# Patient Record
Sex: Female | Born: 1945 | Race: White | Hispanic: No | Smoking: Former smoker
Health system: Southern US, Community
[De-identification: ages and names within clinical notes are randomized; demographics above are authoritative.]

## PROBLEM LIST (undated history)

## (undated) DIAGNOSIS — R911 Solitary pulmonary nodule: Secondary | ICD-10-CM

## (undated) DIAGNOSIS — Z87891 Personal history of nicotine dependence: Secondary | ICD-10-CM

## (undated) DIAGNOSIS — I69354 Hemiplegia and hemiparesis following cerebral infarction affecting left non-dominant side: Secondary | ICD-10-CM

## (undated) DIAGNOSIS — L570 Actinic keratosis: Secondary | ICD-10-CM

## (undated) DIAGNOSIS — N952 Postmenopausal atrophic vaginitis: Secondary | ICD-10-CM

## (undated) DIAGNOSIS — N893 Dysplasia of vagina, unspecified: Secondary | ICD-10-CM

## (undated) DIAGNOSIS — R296 Repeated falls: Secondary | ICD-10-CM

## (undated) DIAGNOSIS — B1920 Unspecified viral hepatitis C without hepatic coma: Secondary | ICD-10-CM

## (undated) DIAGNOSIS — R531 Weakness: Secondary | ICD-10-CM

## (undated) DIAGNOSIS — R102 Pelvic and perineal pain unspecified side: Secondary | ICD-10-CM

## (undated) DIAGNOSIS — I10 Essential (primary) hypertension: Secondary | ICD-10-CM

## (undated) DIAGNOSIS — M1990 Unspecified osteoarthritis, unspecified site: Secondary | ICD-10-CM

## (undated) DIAGNOSIS — T17500A Unspecified foreign body in bronchus causing asphyxiation, initial encounter: Secondary | ICD-10-CM

## (undated) DIAGNOSIS — M810 Age-related osteoporosis without current pathological fracture: Secondary | ICD-10-CM

## (undated) DIAGNOSIS — J984 Other disorders of lung: Secondary | ICD-10-CM

## (undated) DIAGNOSIS — K219 Gastro-esophageal reflux disease without esophagitis: Secondary | ICD-10-CM

## (undated) DIAGNOSIS — E7800 Pure hypercholesterolemia, unspecified: Secondary | ICD-10-CM

## (undated) DIAGNOSIS — Z7901 Long term (current) use of anticoagulants: Secondary | ICD-10-CM

## (undated) DIAGNOSIS — I493 Ventricular premature depolarization: Secondary | ICD-10-CM

## (undated) DIAGNOSIS — E041 Nontoxic single thyroid nodule: Secondary | ICD-10-CM

## (undated) DIAGNOSIS — I639 Cerebral infarction, unspecified: Secondary | ICD-10-CM

## (undated) DIAGNOSIS — R2681 Unsteadiness on feet: Secondary | ICD-10-CM

## (undated) DIAGNOSIS — B009 Herpesviral infection, unspecified: Secondary | ICD-10-CM

## (undated) DIAGNOSIS — I491 Atrial premature depolarization: Secondary | ICD-10-CM

## (undated) DIAGNOSIS — M069 Rheumatoid arthritis, unspecified: Secondary | ICD-10-CM

## (undated) DIAGNOSIS — R87629 Unspecified abnormal cytological findings in specimens from vagina: Secondary | ICD-10-CM

## (undated) DIAGNOSIS — N951 Menopausal and female climacteric states: Secondary | ICD-10-CM

## (undated) DIAGNOSIS — E039 Hypothyroidism, unspecified: Secondary | ICD-10-CM

## (undated) DIAGNOSIS — R918 Other nonspecific abnormal finding of lung field: Secondary | ICD-10-CM

## (undated) DIAGNOSIS — R053 Chronic cough: Secondary | ICD-10-CM

## (undated) HISTORY — DX: Unspecified abnormal cytological findings in specimens from vagina: R87.629

## (undated) HISTORY — DX: Herpesviral infection, unspecified: B00.9

## (undated) HISTORY — PX: OTHER SURGICAL HISTORY: SHX169

## (undated) HISTORY — DX: Gastro-esophageal reflux disease without esophagitis: K21.9

## (undated) HISTORY — PX: CHOLECYSTECTOMY: SHX55

## (undated) HISTORY — DX: Pelvic and perineal pain: R10.2

## (undated) HISTORY — DX: Menopausal and female climacteric states: N95.1

## (undated) HISTORY — DX: Age-related osteoporosis without current pathological fracture: M81.0

## (undated) HISTORY — DX: Pelvic and perineal pain unspecified side: R10.20

## (undated) HISTORY — PX: LOOP RECORDER REMOVAL: EP1215

## (undated) HISTORY — DX: Solitary pulmonary nodule: R91.1

## (undated) HISTORY — PX: TONSILLECTOMY: SUR1361

## (undated) HISTORY — PX: VAGINAL WOUND CLOSURE / REPAIR: SUR258

## (undated) HISTORY — DX: Essential (primary) hypertension: I10

## (undated) HISTORY — DX: Cerebral infarction, unspecified: I63.9

## (undated) HISTORY — DX: Unspecified osteoarthritis, unspecified site: M19.90

## (undated) HISTORY — DX: Hypothyroidism, unspecified: E03.9

## (undated) HISTORY — PX: ADENOIDECTOMY: SUR15

## (undated) HISTORY — DX: Pure hypercholesterolemia, unspecified: E78.00

## (undated) HISTORY — DX: Actinic keratosis: L57.0

## (undated) HISTORY — DX: Dysplasia of vagina, unspecified: N89.3

## (undated) HISTORY — DX: Unspecified viral hepatitis C without hepatic coma: B19.20

## (undated) HISTORY — DX: Postmenopausal atrophic vaginitis: N95.2

## (undated) HISTORY — DX: Nontoxic single thyroid nodule: E04.1

---
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
## 1975-12-29 HISTORY — PX: TUBAL LIGATION: SHX77

## 1980-12-28 HISTORY — PX: FINGER SURGERY: SHX640

## 1981-12-28 HISTORY — PX: VAGINAL HYSTERECTOMY: SUR661

## 1990-12-28 HISTORY — PX: MANDIBLE RECONSTRUCTION: SHX431

## 1992-12-28 HISTORY — PX: BUNIONECTOMY: SHX129

## 1996-12-28 HISTORY — PX: TOENAIL EXCISION: SUR558

## 2000-06-25 ENCOUNTER — Ambulatory Visit (HOSPITAL_COMMUNITY): Admission: RE | Admit: 2000-06-25 | Discharge: 2000-06-25 | Payer: Self-pay

## 2004-10-15 ENCOUNTER — Ambulatory Visit: Payer: Self-pay | Admitting: Internal Medicine

## 2004-10-28 ENCOUNTER — Ambulatory Visit: Payer: Self-pay | Admitting: Internal Medicine

## 2005-02-03 ENCOUNTER — Ambulatory Visit: Payer: Self-pay | Admitting: Internal Medicine

## 2005-07-28 ENCOUNTER — Ambulatory Visit: Payer: Self-pay

## 2005-10-20 ENCOUNTER — Ambulatory Visit: Payer: Self-pay | Admitting: Internal Medicine

## 2006-03-23 ENCOUNTER — Ambulatory Visit: Payer: Self-pay | Admitting: Internal Medicine

## 2006-05-26 ENCOUNTER — Ambulatory Visit: Payer: Self-pay | Admitting: Internal Medicine

## 2006-09-20 ENCOUNTER — Ambulatory Visit: Payer: Self-pay | Admitting: Internal Medicine

## 2006-10-22 ENCOUNTER — Ambulatory Visit: Payer: Self-pay | Admitting: Internal Medicine

## 2007-05-18 ENCOUNTER — Ambulatory Visit: Payer: Self-pay | Admitting: Internal Medicine

## 2007-09-23 ENCOUNTER — Ambulatory Visit: Payer: Self-pay | Admitting: Internal Medicine

## 2007-10-13 ENCOUNTER — Ambulatory Visit: Payer: Self-pay | Admitting: Internal Medicine

## 2007-10-25 ENCOUNTER — Ambulatory Visit: Payer: Self-pay | Admitting: Internal Medicine

## 2007-12-30 DIAGNOSIS — F320 Major depressive disorder, single episode, mild: Secondary | ICD-10-CM

## 2007-12-30 DIAGNOSIS — I10 Essential (primary) hypertension: Secondary | ICD-10-CM

## 2008-01-02 ENCOUNTER — Ambulatory Visit: Payer: Self-pay | Admitting: Internal Medicine

## 2008-01-02 DIAGNOSIS — J984 Other disorders of lung: Secondary | ICD-10-CM | POA: Insufficient documentation

## 2008-11-27 ENCOUNTER — Ambulatory Visit: Payer: Self-pay | Admitting: Internal Medicine

## 2008-11-30 ENCOUNTER — Telehealth (INDEPENDENT_AMBULATORY_CARE_PROVIDER_SITE_OTHER): Payer: Self-pay | Admitting: *Deleted

## 2008-12-13 ENCOUNTER — Ambulatory Visit: Payer: Self-pay | Admitting: Internal Medicine

## 2009-04-10 ENCOUNTER — Ambulatory Visit: Payer: Self-pay | Admitting: Internal Medicine

## 2009-12-10 ENCOUNTER — Ambulatory Visit: Payer: Self-pay | Admitting: Internal Medicine

## 2010-09-23 ENCOUNTER — Ambulatory Visit: Payer: Self-pay

## 2010-12-11 ENCOUNTER — Ambulatory Visit: Payer: Self-pay | Admitting: Internal Medicine

## 2011-05-12 NOTE — Assessment & Plan Note (Signed)
[facility]                             PULMONARY OFFICE NOTE   NAME:Donna Rhodes, Donna Rhodes                       MRN:          [id number]  DATE:10/13/2007                            DOB:          June 04, 1946    PULMONARY CONSULTATION   REASON FOR CONSULTATION:  Abnormal CT scan.   HISTORY:  A 65 year old white female who says that she originally  underwent a screening chest x-ray that showed a spot on her lung.  This was originally in September of 2007, leading to serial CT scans,  the most recent of which was available from September 26 of this year  and shows a stable 4 mm nodule in the right lower lobe.  There are also  several new patchy densities in the lower lobes in a dependent fashion  with a question of whether this might be atelectasis or a new process.   The patient's only complaint is one of intermittent cough that she says,  in retrospect, may be worse in the last few years, but certainly is  not acute in nature and is not associated with any pleuritic pain,  fevers, chills, sweats, increased dyspnea, history of hemoptysis, or  unintended weight loss.   PAST MEDICAL HISTORY:  Significant for hypertension, thyroid problems,  hysterectomy, appendectomy, and anxiety/depression.   ALLERGIES:  CODEINE with a nonspecific reaction, and NONSTEROIDALS.   MEDICATIONS:  Citalopram.  Lisinopril.  Estradiol.  Synthroid.  Prenatal.   SOCIAL HISTORY:  She quit smoking in 1982.  Has worked as a Chartered certified accountant.   FAMILY HISTORY:  Significant in that it is negative for any cancer in  any direct relatives.   REVIEW OF SYSTEMS:  Taken in detail on the work sheet.  She says she can  mow grass for an hour behind a push mower without difficulty.   PHYSICAL EXAMINATION:  This is a very anxious white female in no acute  distress.  VITAL SIGNS:  Stable vital signs.  HEENT:  Minimal turbinate edema.  Oropharynx is clear.  NECK:  Supple without cervical adenopathy  or tenderness.  Trachea is  midline.  No thyromegaly.  LUNGS:  Perfectly clear bilaterally to auscultation and percussion.  CARDIAC:  Regular rate and rhythm without murmur, gallop, or rub.  ABDOMEN:  Soft and benign.  EXTREMITIES:  Warm without calf tenderness, cyanosis, clubbing, or  edema.   A CT scan was reviewed from September 26, as was lab data, including a  normal TSH, chemistry profile.   IMPRESSION:  1. The 4 mm nodule in the right lower lobe is stable for over a year      and is extremely small.  I suspect that is not what was actually      seen on the original chest x-ray that called for the CT scan in the      first place.  I emphasized this to the patient because she has got      the notion that she has a spot on her lung that was seen on the      original x-ray.  I would be  happy to take a look at the original x-      ray to see what it was of concern, but 4 mm is usually not      detectable by the naked eye.  This, of course, brings up the issue      of how long the nodule has been present, since if it is not seen on      chest x-ray, which I believe is probably the case, we cannot      actually tell how old it is and it may have been present for years.      Rather than continuing to do CT scans at this point, there are      several options here.  First, the PET scan is not of any value, nor      is attempting any kind of biopsy or surgery.  In fact, I am not      sure that serial CT scanning is necessary either given the relative      cost and potential radiation involved.  When I see her back, I will      take a look at her plain films and see if there is anything to be      gained on serial chest x-rays as a way of following this.  However,      my sense is that it is extremely unlikely that this is a neoplasm.  2. Intermittent cough may be aggravated by the use of ACE inhibitors.      She says that her cough has gotten worse over the years despite      stopping  smoking.  This is certainly a clue that the ACE inhibitors      might be playing a role.  Note, however, that the cough waxes and      wanes depending on whether she has got a cold or not, and at this      point is more of a nuisance than a threat to her health, so I am      going to refer her to Dr. Roby Lofts capable hands, considering an      alternative agent, such as ARB therapy, and/or depending on the      patient's ability to pay for generic, the possibility of using low      dose beta blockers instead.   The patient point blank asked me, Does that mean I don't have  emphysema?  I cannot really be categoric about her lung functional  status without having her return for a set of PFTs, so I have scheduled  that for 6 weeks and asked her to bring all of her old x-rays back for  serial comparison purposes.     Donna Rhodes. Sherene Sires, MD, [facility]  Electronically Signed    MBW/MedQ  DD: 10/13/2007  DT: 10/14/2007  Job #: [id number]   cc:   Dale

## 2011-06-02 ENCOUNTER — Ambulatory Visit: Payer: Self-pay | Admitting: Internal Medicine

## 2011-08-22 ENCOUNTER — Inpatient Hospital Stay: Payer: Self-pay | Admitting: Internal Medicine

## 2011-08-26 ENCOUNTER — Ambulatory Visit: Payer: Self-pay | Admitting: Surgery

## 2012-01-21 ENCOUNTER — Ambulatory Visit: Payer: Self-pay | Admitting: Internal Medicine

## 2012-09-14 DIAGNOSIS — D1030 Benign neoplasm of unspecified part of mouth: Secondary | ICD-10-CM | POA: Insufficient documentation

## 2012-10-12 ENCOUNTER — Other Ambulatory Visit: Payer: Self-pay | Admitting: *Deleted

## 2012-10-12 MED ORDER — ESOMEPRAZOLE MAGNESIUM 40 MG PO CPDR: 40.0000 mg | DELAYED_RELEASE_CAPSULE | Freq: Every day | ORAL | Status: DC

## 2012-10-12 NOTE — Telephone Encounter (Signed)
R'cd fax from [facility] for refill of Nexium. Rx faxed to [facility].

## 2012-12-22 ENCOUNTER — Telehealth: Payer: Self-pay | Admitting: Internal Medicine

## 2012-12-22 MED ORDER — LEVOTHYROXINE SODIUM 75 MCG PO TABS: 75.0000 ug | ORAL_TABLET | Freq: Every day | ORAL | Status: DC

## 2012-12-22 NOTE — Telephone Encounter (Signed)
Sent in to pharmacy.  

## 2012-12-22 NOTE — Telephone Encounter (Signed)
Refill request for levothyroxine 75 mcg tablet Qty:90 Sig: take one table by mouth once a day  Patient has an appointment on 12/29/12

## 2012-12-29 ENCOUNTER — Encounter: Payer: Self-pay | Admitting: Internal Medicine

## 2012-12-29 ENCOUNTER — Ambulatory Visit (INDEPENDENT_AMBULATORY_CARE_PROVIDER_SITE_OTHER): Payer: 59 | Admitting: Internal Medicine

## 2012-12-29 VITALS — BP 148/82 | HR 76 | Temp 97.9°F | Ht 63.5 in | Wt 124.5 lb

## 2012-12-29 DIAGNOSIS — Z139 Encounter for screening, unspecified: Secondary | ICD-10-CM

## 2012-12-29 DIAGNOSIS — M2550 Pain in unspecified joint: Secondary | ICD-10-CM

## 2012-12-29 DIAGNOSIS — M1990 Unspecified osteoarthritis, unspecified site: Secondary | ICD-10-CM | POA: Insufficient documentation

## 2012-12-29 DIAGNOSIS — J984 Other disorders of lung: Secondary | ICD-10-CM

## 2012-12-29 DIAGNOSIS — R5383 Other fatigue: Secondary | ICD-10-CM

## 2012-12-29 DIAGNOSIS — E039 Hypothyroidism, unspecified: Secondary | ICD-10-CM

## 2012-12-29 DIAGNOSIS — E7800 Pure hypercholesterolemia, unspecified: Secondary | ICD-10-CM

## 2012-12-29 DIAGNOSIS — I10 Essential (primary) hypertension: Secondary | ICD-10-CM

## 2012-12-29 DIAGNOSIS — E786 Lipoprotein deficiency: Secondary | ICD-10-CM

## 2012-12-29 LAB — CBC WITH DIFFERENTIAL/PLATELET
Eosinophils Absolute: 0.1 10*3/uL (ref 0.0–0.7)
MCHC: 33.6 g/dL (ref 30.0–36.0)
MCV: 90.3 fl (ref 78.0–100.0)
Monocytes Absolute: 0.5 10*3/uL (ref 0.1–1.0)
Neutrophils Relative %: 58.3 % (ref 43.0–77.0)
Platelets: 230 10*3/uL (ref 150.0–400.0)
RDW: 13.2 % (ref 11.5–14.6)

## 2012-12-29 LAB — COMPREHENSIVE METABOLIC PANEL
AST: 21 U/L (ref 0–37)
Albumin: 3.9 g/dL (ref 3.5–5.2)
Alkaline Phosphatase: 79 U/L (ref 39–117)
Potassium: 4.3 mEq/L (ref 3.5–5.1)
Sodium: 137 mEq/L (ref 135–145)
Total Bilirubin: 0.5 mg/dL (ref 0.3–1.2)
Total Protein: 7.4 g/dL (ref 6.0–8.3)

## 2012-12-29 LAB — LIPID PANEL
Cholesterol: 203 mg/dL — ABNORMAL HIGH (ref 0–200)
HDL: 68.4 mg/dL (ref >39.00)
Total CHOL/HDL Ratio: 3
VLDL: 17 mg/dL (ref 0.0–40.0)

## 2012-12-31 ENCOUNTER — Encounter: Payer: Self-pay | Admitting: Internal Medicine

## 2012-12-31 DIAGNOSIS — E7800 Pure hypercholesterolemia, unspecified: Secondary | ICD-10-CM | POA: Insufficient documentation

## 2012-12-31 NOTE — Assessment & Plan Note (Signed)
On synthroid.  Check TSH.

## 2012-12-31 NOTE — Assessment & Plan Note (Signed)
Involving [facility].  Had follow up chest CT 09/23/10 - stable.   Recommended no further scanning since stable for over two years.    

## 2012-12-31 NOTE — Assessment & Plan Note (Signed)
See above

## 2012-12-31 NOTE — Assessment & Plan Note (Signed)
Blood pressure as outlined.  Have her spot check her pressures.  Follow.  Same medication regimen.  Check metabolic panel.

## 2012-12-31 NOTE — Progress Notes (Signed)
Subjective:    Patient ID: Donna Rhodes, female    DOB: 1946/01/26, 67 y.o.   MRN: [id number]  HPI 67 year old female with past history of hypertension and hypercholesterolemia who comes in today for a scheduled follow up.  Was seeing Dr Andee Poles for her mouth lesion.  As seen at [facility] as well.  Was diagnosed with scleroderma.  Mouth is better now.  She is also having increased stiffness and joint pain.  Multiple joints.  She has recently been having increased pain and problems with her right elbow.  Had injection.   Did not help.  Used voltaren gel.  Did not help.  Unable to tolerate increased antiinflammatories.  Has noticed some skin changes also.  No cardiac symptoms with increased activity or exertion.  Breathing stable.  No bowel change.   Past Medical History  Diagnosis Date  . Hypertension   . Hypothyroidism   . HCV infection   . HSV (herpes simplex virus) infection   . Lung nodule     stable  . Osteoarthritis     hand involvement, cervical disc disease  . Thyroid nodule   . Hypercholesterolemia   . GERD (gastroesophageal reflux disease)     Current Outpatient Prescriptions on File Prior to Visit  Medication Sig Dispense Refill  . amLODipine (NORVASC) 5 MG tablet Take 5 mg by mouth daily.      Marland Kitchen esomeprazole (NEXIUM) 40 MG capsule Take 1 capsule (40 mg total) by mouth daily.  90 capsule  1  . estradiol (ESTRACE) 1 MG tablet Take 1 mg by mouth daily.      Marland Kitchen levothyroxine (SYNTHROID, LEVOTHROID) 75 MCG tablet Take 1 tablet (75 mcg total) by mouth daily.  30 tablet  0  . hydrochlorothiazide (HYDRODIURIL) 25 MG tablet 1/2 tablet daily        Review of Systems Patient denies any headache, lightheadedness or dizziness.  No sinus or allergy symptoms.  No chest pain, tightness or palpitations.  No increased shortness of breath, cough or congestion.  No nausea or vomiting.  Reflux controlled.  No abdominal pain or cramping.  No bowel change, such as diarrhea, constipation, BRBPR or  melana.  No urine change.  Joint pain and stiffness as outlined.        Objective:   Physical Exam Filed Vitals:   12/29/12 1044  BP: 148/82  Pulse: 76  Temp: 97.9 F (36.6 C)   Blood pressure recheck:  35/79  67 year old female in no acute distress.   HEENT:  Nares - clear.  OP- without lesions or erythema.  NECK:  Supple, nontender.  No audible bruit.   HEART:  Appears to be regular. LUNGS:  Without crackles or wheezing audible.  Respirations even and unlabored.   RADIAL PULSE:  Equal bilaterally.  ABDOMEN:  Soft, nontender.  No audible abdominal bruit.   EXTREMITIES:  No increased edema to be present.  Unable to fully extend her right arm.                   Assessment & Plan:  MSK.  Joint pain and stiffness - persistent.  Recently diagnosed with "scleroderma".  Has seen Dr Gavin Potters previously.  Will refer back for reevaluation.  Tylenol for the pain - as directed.    VENOUS INSUFFICIENCY.  Has seen Dr Wyn Quaker.  Doing well.  Continue support hose.    PREVIOUS ABNORMAL PAP SMEAR.  Following with Dr Skeet SimmerLorre Munroe.  Last pap normal.  Has follow  up planned 4/14.    GI.  Colonoscopy 5/04.  Currently asymptomatic.    HEALTH MAINTENANCE.  Physical 04/18/12.  Pelvics/paps through GYN.  Colonoscopy as outlined.  Mammogram 01/21/12 - Birads II.

## 2012-12-31 NOTE — Assessment & Plan Note (Signed)
Low cholesterol diet and exercise.  Check lipid profile.   

## 2013-03-27 ENCOUNTER — Ambulatory Visit: Payer: Self-pay | Admitting: Internal Medicine

## 2013-04-11 ENCOUNTER — Encounter: Payer: Self-pay | Admitting: Internal Medicine

## 2013-05-18 ENCOUNTER — Encounter: Payer: 59 | Admitting: Internal Medicine

## 2013-05-24 ENCOUNTER — Telehealth: Payer: Self-pay | Admitting: Internal Medicine

## 2013-05-24 NOTE — Telephone Encounter (Signed)
Patient Information:  Caller Name: Lise  Phone: [phone]  Patient: Donna Rhodes  Gender: Female  DOB: 11/14/1946  Age: 67 Years  PCP: [facility] [place] [facility]  Office Follow Up:  Does the office need to follow up with this patient?: No  Instructions For The Office: N/A   Symptoms  Reason For Call & Symptoms: Pt states she is having pressure in the bladder.  Reviewed Health History In EMR: Yes  Reviewed Medications In EMR: Yes  Reviewed Allergies In EMR: Yes  Reviewed Surgeries / Procedures: Yes  Date of Onset of Symptoms: 05/17/2013  Guideline(s) Used:  Urination Pain - Female  Disposition Per Guideline:   See Today in Office  Reason For Disposition Reached:   Age > 50 years  Advice Given:  Call Back If:  You become worse.  Patient Will Follow Care Advice:  YES  Appointment Scheduled:  05/25/2013 10:00:00 Appointment Scheduled Provider:  Dale Lovilia  Pt states she is working today but can schedule appt in the morning.

## 2013-05-24 NOTE — Telephone Encounter (Signed)
Has appt on 5/29 @ 10am with Dr. Lorin Picket

## 2013-05-25 ENCOUNTER — Ambulatory Visit (INDEPENDENT_AMBULATORY_CARE_PROVIDER_SITE_OTHER): Payer: 59 | Admitting: Internal Medicine

## 2013-05-25 ENCOUNTER — Encounter: Payer: Self-pay | Admitting: Internal Medicine

## 2013-05-25 VITALS — BP 110/60 | HR 81 | Temp 98.2°F | Ht 63.5 in | Wt 122.2 lb

## 2013-05-25 DIAGNOSIS — N390 Urinary tract infection, site not specified: Secondary | ICD-10-CM

## 2013-05-25 DIAGNOSIS — I10 Essential (primary) hypertension: Secondary | ICD-10-CM

## 2013-05-25 DIAGNOSIS — E7800 Pure hypercholesterolemia, unspecified: Secondary | ICD-10-CM

## 2013-05-25 DIAGNOSIS — R5383 Other fatigue: Secondary | ICD-10-CM

## 2013-05-25 DIAGNOSIS — E039 Hypothyroidism, unspecified: Secondary | ICD-10-CM

## 2013-05-25 DIAGNOSIS — R5381 Other malaise: Secondary | ICD-10-CM

## 2013-05-25 LAB — CBC WITH DIFFERENTIAL/PLATELET
Basophils Relative: 1.3 % (ref 0.0–3.0)
Eosinophils Relative: 2.5 % (ref 0.0–5.0)
Hemoglobin: 12.6 g/dL (ref 12.0–15.0)
Lymphocytes Relative: 26.5 % (ref 12.0–46.0)
Neutro Abs: 3.3 10*3/uL (ref 1.4–7.7)
Neutrophils Relative %: 59.7 % (ref 43.0–77.0)
RBC: 4.18 Mil/uL (ref 3.87–5.11)
WBC: 5.4 10*3/uL (ref 4.5–10.5)

## 2013-05-25 LAB — POCT URINALYSIS DIPSTICK
Bilirubin, UA: NEGATIVE
Spec Grav, UA: 1.025
pH, UA: 5.5

## 2013-05-25 LAB — COMPREHENSIVE METABOLIC PANEL
ALT: 18 U/L (ref 0–35)
AST: 22 U/L (ref 0–37)
Alkaline Phosphatase: 85 U/L (ref 39–117)
Creatinine, Ser: 0.8 mg/dL (ref 0.4–1.2)
GFR: 74.84 mL/min (ref >60.00)
Total Bilirubin: 0.4 mg/dL (ref 0.3–1.2)

## 2013-05-25 LAB — LIPID PANEL
Cholesterol: 203 mg/dL — ABNORMAL HIGH (ref 0–200)
HDL: 63 mg/dL (ref >39.00)
Total CHOL/HDL Ratio: 3
Triglycerides: 70 mg/dL (ref 0.0–149.0)
VLDL: 14 mg/dL (ref 0.0–40.0)

## 2013-05-25 MED ORDER — NITROFURANTOIN MONOHYD MACRO 100 MG PO CAPS: 100.0000 mg | ORAL_CAPSULE | Freq: Two times a day (BID) | ORAL | Status: DC

## 2013-05-26 ENCOUNTER — Encounter: Payer: Self-pay | Admitting: *Deleted

## 2013-05-26 LAB — URINE CULTURE: Colony Count: NO GROWTH

## 2013-05-28 ENCOUNTER — Encounter: Payer: Self-pay | Admitting: Internal Medicine

## 2013-05-28 NOTE — Progress Notes (Signed)
  Subjective:    Patient ID: Donna Rhodes, female    DOB: [birth year], 67 y.o.   MRN: [id number]  Urinary Frequency  Associated symptoms include frequency.  67 year old female with past history of hypertension and hypercholesterolemia who comes in today as a work in with concerns regarding some increased lower abdominal pressure for the last two weeks.  She is concerned she may have a urinary tract infection. No dysuria or hematuria.  No vaginal symptoms reported.  No bowel change.  Eating and drinking well.  No nausea or vomiting.    Past Medical History  Diagnosis Date  . Hypertension   . Hypothyroidism   . HCV infection   . HSV (herpes simplex virus) infection   . Lung nodule     stable  . Osteoarthritis     hand involvement, cervical disc disease  . Thyroid nodule   . Hypercholesterolemia   . GERD (gastroesophageal reflux disease)     Current Outpatient Prescriptions on File Prior to Visit  Medication Sig Dispense Refill  . amLODipine (NORVASC) 5 MG tablet Take 5 mg by mouth daily.      Marland Kitchen esomeprazole (NEXIUM) 40 MG capsule Take 1 capsule (40 mg total) by mouth daily.  90 capsule  1  . estradiol (ESTRACE) 1 MG tablet Take 1 mg by mouth daily.      Marland Kitchen levothyroxine (SYNTHROID, LEVOTHROID) 75 MCG tablet Take 1 tablet (75 mcg total) by mouth daily.  30 tablet  0  . hydrochlorothiazide (HYDRODIURIL) 25 MG tablet 1/2 tablet daily      . Multiple Vitamin (MULTIVITAMIN) capsule Take 1 capsule by mouth daily.      . pilocarpine (SALAGEN) 5 MG tablet Take 5 mg by mouth 3 (three) times daily.      . vitamin E 100 UNIT capsule Take 100 Units by mouth daily.       No current facility-administered medications on file prior to visit.    Review of Systems  Genitourinary: Positive for frequency.  Patient denies any fever.   No nausea or vomiting.  No significant abdominal pain or cramping. Some lower abdominal pressure.  No bowel change.  No dysuria or hematuria.  No vaginal problems  reported.        Objective:   Physical Exam  Filed Vitals:   05/25/13 1010  BP: 110/60  Pulse: 81  Temp: 98.2 F (68.36 C)   67 year old female in no acute distress. HEART:  Appears to be regular. LUNGS:  Without crackles or wheezing audible.  Respirations even and unlabored.   ABDOMEN:  Soft.  No significant tenderness to palpation.  Bowel sounds present and normal.                  Assessment & Plan:  POSSIBLE UTI.  Urine dip revealed trace leukocytes.  Given the change in urinary symptoms and urine dip results, will send for culture.  Treat with macrobid 100mg  bid until culture results return.    PREVIOUS ABNORMAL PAP SMEAR.  Following with Dr Skeet SimmerLorre Munroe.  Last pap normal.  Has follow up planned 4/14.   HEALTH MAINTENANCE.  Physical 04/18/12.  Pelvics/paps through GYN.  Colonoscopy as outlined.  Mammogram 01/21/12 - Birads II.

## 2013-05-29 ENCOUNTER — Ambulatory Visit (INDEPENDENT_AMBULATORY_CARE_PROVIDER_SITE_OTHER): Payer: 59 | Admitting: Internal Medicine

## 2013-05-29 ENCOUNTER — Encounter: Payer: Self-pay | Admitting: Internal Medicine

## 2013-05-29 VITALS — BP 110/70 | HR 78 | Temp 98.3°F | Ht 63.25 in | Wt 122.5 lb

## 2013-05-29 DIAGNOSIS — M1990 Unspecified osteoarthritis, unspecified site: Secondary | ICD-10-CM

## 2013-05-29 DIAGNOSIS — I10 Essential (primary) hypertension: Secondary | ICD-10-CM

## 2013-05-29 DIAGNOSIS — E7800 Pure hypercholesterolemia, unspecified: Secondary | ICD-10-CM

## 2013-05-29 DIAGNOSIS — E039 Hypothyroidism, unspecified: Secondary | ICD-10-CM

## 2013-05-29 DIAGNOSIS — Z86010 Personal history of colonic polyps: Secondary | ICD-10-CM

## 2013-05-29 DIAGNOSIS — J984 Other disorders of lung: Secondary | ICD-10-CM

## 2013-05-29 NOTE — Assessment & Plan Note (Signed)
See above

## 2013-05-29 NOTE — Assessment & Plan Note (Signed)
Involving [facility].  Had follow up chest CT 09/23/10 - stable.   Recommended no further scanning since stable for over two years.    

## 2013-05-29 NOTE — Assessment & Plan Note (Signed)
Low cholesterol diet and exercise.  Follow lipid profile.    

## 2013-05-29 NOTE — Assessment & Plan Note (Signed)
Blood pressure doing well.  Follow.  

## 2013-05-29 NOTE — Assessment & Plan Note (Signed)
On synthroid.  recent TSH wnl.     

## 2013-05-29 NOTE — Progress Notes (Signed)
Subjective:    Patient ID: Donna Rhodes, female    DOB: [birth year], 67 y.o.   MRN: [id number]  HPI 67 year old female with past history of hypertension and hypercholesterolemia who comes in today to follow up on these issues as well as for a complete physical exam.  I saw her last week for some lower abdominal/pelvic fullness.  She was treated for a urinary tract infection.  Culture returned negative.  Some improvement in symptoms, but she still reports some fullness.  Has a history of herpes.  Thinks she may have an outbreak now.  No vaginal itching or discharge.  Sees Dr Doristine Church for her pelvic exams and pap smears.  Last pap normal 4/13.  Overdue follow up.  Breathing stable.  No sinus or allergy symptoms.  No increased cough or congestion.  No acid reflux.  No bowel change.    Past Medical History  Diagnosis Date  . Hypertension   . Hypothyroidism   . HCV infection   . HSV (herpes simplex virus) infection   . Lung nodule     stable  . Osteoarthritis     hand involvement, cervical disc disease  . Thyroid nodule   . Hypercholesterolemia   . GERD (gastroesophageal reflux disease)     Current Outpatient Prescriptions on File Prior to Visit  Medication Sig Dispense Refill  . amLODipine (NORVASC) 5 MG tablet Take 5 mg by mouth daily.      Marland Kitchen esomeprazole (NEXIUM) 40 MG capsule Take 1 capsule (40 mg total) by mouth daily.  90 capsule  1  . estradiol (ESTRACE) 1 MG tablet Take 1 mg by mouth daily.      . hydrochlorothiazide (HYDRODIURIL) 25 MG tablet 1/2 tablet daily      . levothyroxine (SYNTHROID, LEVOTHROID) 75 MCG tablet Take 1 tablet (75 mcg total) by mouth daily.  30 tablet  0  . Multiple Vitamin (MULTIVITAMIN) capsule Take 1 capsule by mouth daily.      . pilocarpine (SALAGEN) 5 MG tablet Take 5 mg by mouth 3 (three) times daily.      . vitamin E 100 UNIT capsule Take 100 Units by mouth daily.       No current facility-administered medications on file prior to visit.    Review of  Systems  Patient denies any headache, lightheadedness or dizziness.  No sinus or allergy symptoms.  No chest pain, tightness or palpitations.  No increased shortness of breath, cough or congestion.  No nausea or vomiting.  Reflux controlled.  No abdominal pain or cramping.  No bowel change, such as diarrhea, constipation, BRBPR or melana.  No urine change.  Joint pain and stiffness as outlined.  Increased stress with her daughter's issues.  Feels she is handling things relatively well.  Discussed at length with her today.  Follow.        Objective:   Physical Exam  Filed Vitals:   05/29/13 1554  BP: 110/70  Pulse: 78  Temp: 98.3 F (36.8 C)   Blood pressure recheck:  92/95  67 year old female in no acute distress.   HEENT:  Nares- clear.  Oropharynx - without lesions. NECK:  Supple.  Nontender.  No audible bruit.  HEART:  Appears to be regular. LUNGS:  No crackles or wheezing audible.  Respirations even and unlabored.  RADIAL PULSE:  Equal bilaterally.    BREASTS:  No nipple discharge or nipple retraction present.  Could not appreciate any distinct nodules or axillary adenopathy.  ABDOMEN:  Soft, nontender.  Bowel sounds present and normal.  No audible abdominal bruit.  GU:  Performed through gyn.     EXTREMITIES:  No increased edema present.  DP pulses palpable and equal bilaterally.            Assessment & Plan:  MSK. Appears to be stable.  Follow.   VENOUS INSUFFICIENCY.  Has seen Dr Wyn Quaker.  Doing well.  Continue support hose.    PREVIOUS ABNORMAL PAP SMEAR.  Following with Dr Skeet SimmerLorre Munroe.  Last pap normal (4/13).  Overdue.  Plans to call for follow up.   GI.  Colonoscopy 5/04.  Currently asymptomatic.  Due follow up colonoscopy.  Refer to GI.    HEALTH MAINTENANCE.  Physical today.  Pelvics/paps through GYN.  Colonoscopy as outlined.  Refer back to GI for follow up colonoscopy.  Mammogram 03/27/13 - Birads II.

## 2013-06-04 ENCOUNTER — Other Ambulatory Visit: Payer: Self-pay | Admitting: Internal Medicine

## 2013-06-04 MED ORDER — AMLODIPINE BESYLATE 5 MG PO TABS: 5.0000 mg | ORAL_TABLET | Freq: Every day | ORAL | Status: DC

## 2013-06-04 NOTE — Progress Notes (Signed)
Refilled amlodipine 5mg  #90  With 3 refills

## 2013-06-21 ENCOUNTER — Encounter: Payer: Self-pay | Admitting: Internal Medicine

## 2013-06-28 ENCOUNTER — Other Ambulatory Visit: Payer: Self-pay | Admitting: Internal Medicine

## 2013-07-01 ENCOUNTER — Other Ambulatory Visit: Payer: Self-pay | Admitting: Internal Medicine

## 2013-07-20 ENCOUNTER — Encounter: Payer: Self-pay | Admitting: Orthopedic Surgery

## 2013-08-18 DIAGNOSIS — R3989 Other symptoms and signs involving the genitourinary system: Secondary | ICD-10-CM | POA: Insufficient documentation

## 2013-08-18 DIAGNOSIS — N393 Stress incontinence (female) (male): Secondary | ICD-10-CM | POA: Insufficient documentation

## 2013-08-18 DIAGNOSIS — R339 Retention of urine, unspecified: Secondary | ICD-10-CM | POA: Insufficient documentation

## 2013-10-05 DIAGNOSIS — R1084 Generalized abdominal pain: Secondary | ICD-10-CM | POA: Insufficient documentation

## 2013-10-23 ENCOUNTER — Ambulatory Visit: Payer: Self-pay | Admitting: Gastroenterology

## 2013-10-23 LAB — HM COLONOSCOPY

## 2013-11-01 ENCOUNTER — Encounter: Payer: Self-pay | Admitting: Internal Medicine

## 2013-11-01 DIAGNOSIS — R87619 Unspecified abnormal cytological findings in specimens from cervix uteri: Secondary | ICD-10-CM

## 2013-11-16 ENCOUNTER — Other Ambulatory Visit: Payer: Self-pay | Admitting: Internal Medicine

## 2013-11-28 ENCOUNTER — Encounter (INDEPENDENT_AMBULATORY_CARE_PROVIDER_SITE_OTHER): Payer: Self-pay

## 2013-11-28 ENCOUNTER — Encounter: Payer: Self-pay | Admitting: Internal Medicine

## 2013-11-28 ENCOUNTER — Ambulatory Visit (INDEPENDENT_AMBULATORY_CARE_PROVIDER_SITE_OTHER): Payer: 59 | Admitting: Internal Medicine

## 2013-11-28 ENCOUNTER — Telehealth: Payer: Self-pay | Admitting: *Deleted

## 2013-11-28 VITALS — BP 130/66 | HR 69 | Temp 98.2°F | Resp 12 | Ht 63.25 in | Wt 122.5 lb

## 2013-11-28 DIAGNOSIS — L989 Disorder of the skin and subcutaneous tissue, unspecified: Secondary | ICD-10-CM

## 2013-11-28 DIAGNOSIS — I10 Essential (primary) hypertension: Secondary | ICD-10-CM

## 2013-11-28 DIAGNOSIS — M1990 Unspecified osteoarthritis, unspecified site: Secondary | ICD-10-CM

## 2013-11-28 DIAGNOSIS — E7800 Pure hypercholesterolemia, unspecified: Secondary | ICD-10-CM

## 2013-11-28 DIAGNOSIS — J984 Other disorders of lung: Secondary | ICD-10-CM

## 2013-11-28 DIAGNOSIS — F329 Major depressive disorder, single episode, unspecified: Secondary | ICD-10-CM

## 2013-11-28 DIAGNOSIS — E039 Hypothyroidism, unspecified: Secondary | ICD-10-CM

## 2013-11-28 NOTE — Telephone Encounter (Signed)
What labs and dx?  

## 2013-11-28 NOTE — Progress Notes (Signed)
Pre visit review using our clinic review tool, if applicable. No additional management support is needed unless otherwise documented below in the visit note. 

## 2013-11-28 NOTE — Telephone Encounter (Signed)
Labs ordered.

## 2013-11-29 LAB — BASIC METABOLIC PANEL
CO2: 26 mEq/L (ref 19–32)
Calcium: 9 mg/dL (ref 8.4–10.5)
Chloride: 107 mEq/L (ref 96–112)
Glucose, Bld: 82 mg/dL (ref 70–99)
Potassium: 4.1 mEq/L (ref 3.5–5.1)
Sodium: 139 mEq/L (ref 135–145)

## 2013-11-30 ENCOUNTER — Encounter: Payer: Self-pay | Admitting: *Deleted

## 2013-12-01 ENCOUNTER — Telehealth: Payer: Self-pay | Admitting: Internal Medicine

## 2013-12-01 DIAGNOSIS — L989 Disorder of the skin and subcutaneous tissue, unspecified: Secondary | ICD-10-CM

## 2013-12-01 NOTE — Telephone Encounter (Signed)
The patient called asking if her appointment had been scheduled to see the Dermatologist in [place]( Dr. Cheree Ditto) . She is needing a referral in the system she can't have any appointments on the 10 th ,18th , and the 22nd.

## 2013-12-01 NOTE — Telephone Encounter (Signed)
See below

## 2013-12-02 ENCOUNTER — Encounter: Payer: Self-pay | Admitting: Internal Medicine

## 2013-12-02 NOTE — Assessment & Plan Note (Signed)
Involving [facility].  Had follow up chest CT 09/23/10 - stable.   Recommended no further scanning since stable for over two years.    

## 2013-12-02 NOTE — Assessment & Plan Note (Signed)
Increased stress as outlined.  Overall she feels she is doing well.    

## 2013-12-02 NOTE — Telephone Encounter (Signed)
Referral placed to Dr Cheree Ditto for persistent facial lesions.

## 2013-12-02 NOTE — Assessment & Plan Note (Signed)
Low cholesterol diet and exercise.  Follow lipid profile.    

## 2013-12-02 NOTE — Assessment & Plan Note (Signed)
Persistent facial lesion.  Refer to dermatology.    

## 2013-12-02 NOTE — Progress Notes (Signed)
Subjective:    Patient ID: Donna Rhodes, female    DOB: 27-May-1946, 67 y.o.   MRN: [id number]  HPI 67 year old female with past history of hypertension and hypercholesterolemia who comes in today for a scheduled follow up.   Breathing stable.  No sinus or allergy symptoms.  No increased cough or congestion.  No acid reflux.  No bowel change.  No urinary symptoms.  Still some pressure.  Seeing Dr Greggory Keen now.  Was evaluated by urology.  Work up unrevealing.  Now being followed by Dr Greggory Keen.  Plans to f/u with gyn.  We discussed today regarding the need to come off estrogen.  She has a history of phlebitis.  She is also under increased stress.  Her daughter was just recently involved in an accident.  She feels she is coping well.     Past Medical History  Diagnosis Date  . Hypertension   . Hypothyroidism   . HCV infection   . HSV (herpes simplex virus) infection   . Lung nodule     stable  . Osteoarthritis     hand involvement, cervical disc disease  . Thyroid nodule   . Hypercholesterolemia   . GERD (gastroesophageal reflux disease)     Current Outpatient Prescriptions on File Prior to Visit  Medication Sig Dispense Refill  . amLODipine (NORVASC) 5 MG tablet TAKE 1 TABLET BY MOUTH ONCE A DAY  90 tablet  1  . estradiol (ESTRACE) 1 MG tablet Take 1 mg by mouth daily.      Marland Kitchen levothyroxine (SYNTHROID, LEVOTHROID) 75 MCG tablet TAKE 1 TABLET BY MOUTH ONCE A DAY  90 tablet  3  . Multiple Vitamin (MULTIVITAMIN) capsule Take 1 capsule by mouth daily.      Marland Kitchen NEXIUM 40 MG capsule TAKE 1 CAPSULE BY MOUTH ONCE A DAY  90 capsule  1   No current facility-administered medications on file prior to visit.    Review of Systems  Patient denies any headache, lightheadedness or dizziness.  No sinus or allergy symptoms.  No chest pain, tightness or palpitations.  No increased shortness of breath, cough or congestion.  No nausea or vomiting.  Reflux controlled.  No abdominal pain or cramping.   No bowel change, such as diarrhea, constipation, BRBPR or melana.  No urine change.  Joint pain and stiffness as outlined.  Increased stress with her daughter's issues.  Feels she is handling things relatively well.  Discussed at length with her today.  Follow.        Objective:   Physical Exam  Filed Vitals:   11/28/13 1121  BP: 130/66  Pulse: 69  Temp: 98.2 F (36.8 C)  Resp: 71   67 year old female in no acute distress.   HEENT:  Nares- clear.  Oropharynx - without lesions. NECK:  Supple.  Nontender.  No audible bruit.  HEART:  Appears to be regular. LUNGS:  No crackles or wheezing audible.  Respirations even and unlabored.  RADIAL PULSE:  Equal bilaterally.  ABDOMEN:  Soft, nontender.  Bowel sounds present and normal.  No audible abdominal bruit.    EXTREMITIES:  No increased edema present.  DP pulses palpable and equal bilaterally.            Assessment & Plan:  MSK. Appears to be stable.  Follow.   VENOUS INSUFFICIENCY.  Has seen Dr Wyn Quaker.  Doing well.  Continue support hose.    PREVIOUS ABNORMAL PAP SMEAR.  Was followed by Dr Skeet Simmer-  Lorre Munroe.  Pap normal (4/13).  Now seeing Dr Greggory Keen.    GI.  Colonoscopy 10/23/13 - tortuous colon.  Recommended f/u colonoscopy n 10 years.    HEALTH MAINTENANCE.  Physical 05/29/13.   Pelvics/paps through GYN.  Colonoscopy as outlined.   Mammogram 03/27/13 - Birads II.

## 2013-12-02 NOTE — Assessment & Plan Note (Signed)
Blood pressure doing well.  Follow.  

## 2013-12-02 NOTE — Assessment & Plan Note (Signed)
On synthroid.  recent TSH wnl.     

## 2013-12-02 NOTE — Assessment & Plan Note (Signed)
Stable

## 2014-02-01 ENCOUNTER — Ambulatory Visit: Payer: 59 | Admitting: Internal Medicine

## 2014-03-27 ENCOUNTER — Encounter: Payer: Self-pay | Admitting: Internal Medicine

## 2014-03-27 ENCOUNTER — Telehealth: Payer: Self-pay | Admitting: Internal Medicine

## 2014-03-27 ENCOUNTER — Ambulatory Visit (INDEPENDENT_AMBULATORY_CARE_PROVIDER_SITE_OTHER): Payer: 59 | Admitting: Internal Medicine

## 2014-03-27 VITALS — BP 110/70 | HR 64 | Temp 97.7°F | Ht 63.25 in | Wt 122.5 lb

## 2014-03-27 DIAGNOSIS — I10 Essential (primary) hypertension: Secondary | ICD-10-CM

## 2014-03-27 DIAGNOSIS — J984 Other disorders of lung: Secondary | ICD-10-CM

## 2014-03-27 DIAGNOSIS — F3289 Other specified depressive episodes: Secondary | ICD-10-CM

## 2014-03-27 DIAGNOSIS — N951 Menopausal and female climacteric states: Secondary | ICD-10-CM

## 2014-03-27 DIAGNOSIS — F329 Major depressive disorder, single episode, unspecified: Secondary | ICD-10-CM

## 2014-03-27 DIAGNOSIS — E039 Hypothyroidism, unspecified: Secondary | ICD-10-CM

## 2014-03-27 DIAGNOSIS — Z1239 Encounter for other screening for malignant neoplasm of breast: Secondary | ICD-10-CM

## 2014-03-27 DIAGNOSIS — E7800 Pure hypercholesterolemia, unspecified: Secondary | ICD-10-CM

## 2014-03-27 DIAGNOSIS — R1032 Left lower quadrant pain: Secondary | ICD-10-CM

## 2014-03-27 DIAGNOSIS — L989 Disorder of the skin and subcutaneous tissue, unspecified: Secondary | ICD-10-CM

## 2014-03-27 LAB — CBC WITH DIFFERENTIAL/PLATELET
Basophils Absolute: 0 10*3/uL (ref 0.0–0.1)
Basophils Relative: 0.9 % (ref 0.0–3.0)
EOS PCT: 6.4 % — AB (ref 0.0–5.0)
Eosinophils Absolute: 0.3 10*3/uL (ref 0.0–0.7)
HCT: 38.3 % (ref 36.0–46.0)
HEMOGLOBIN: 12.7 g/dL (ref 12.0–15.0)
LYMPHS ABS: 1.6 10*3/uL (ref 0.7–4.0)
Lymphocytes Relative: 32.4 % (ref 12.0–46.0)
MCHC: 33.1 g/dL (ref 30.0–36.0)
MCV: 91 fl (ref 78.0–100.0)
MONO ABS: 0.4 10*3/uL (ref 0.1–1.0)
Monocytes Relative: 9.1 % (ref 3.0–12.0)
NEUTROS ABS: 2.5 10*3/uL (ref 1.4–7.7)
Neutrophils Relative %: 51.2 % (ref 43.0–77.0)
PLATELETS: 237 10*3/uL (ref 150.0–400.0)
RBC: 4.21 Mil/uL (ref 3.87–5.11)
RDW: 13.4 % (ref 11.5–14.6)
WBC: 4.9 10*3/uL (ref 4.5–10.5)

## 2014-03-27 LAB — LIPID PANEL
CHOL/HDL RATIO: 3
CHOLESTEROL: 207 mg/dL — AB (ref 0–200)
HDL: 61.1 mg/dL (ref >39.00)
LDL CALC: 133 mg/dL — AB (ref 0–99)
Triglycerides: 63 mg/dL (ref 0.0–149.0)
VLDL: 12.6 mg/dL (ref 0.0–40.0)

## 2014-03-27 LAB — COMPREHENSIVE METABOLIC PANEL
ALK PHOS: 80 U/L (ref 39–117)
ALT: 19 U/L (ref 0–35)
AST: 22 U/L (ref 0–37)
Albumin: 4 g/dL (ref 3.5–5.2)
BILIRUBIN TOTAL: 0.5 mg/dL (ref 0.3–1.2)
BUN: 18 mg/dL (ref 6–23)
CO2: 26 mEq/L (ref 19–32)
CREATININE: 0.9 mg/dL (ref 0.4–1.2)
Calcium: 9.3 mg/dL (ref 8.4–10.5)
Chloride: 107 mEq/L (ref 96–112)
GFR: 70.61 mL/min (ref >60.00)
GLUCOSE: 91 mg/dL (ref 70–99)
Potassium: 4.3 mEq/L (ref 3.5–5.1)
Sodium: 141 mEq/L (ref 135–145)
TOTAL PROTEIN: 7.2 g/dL (ref 6.0–8.3)

## 2014-03-27 LAB — TSH: TSH: 4.59 u[IU]/mL (ref 0.35–5.50)

## 2014-03-27 MED ORDER — AMLODIPINE BESYLATE 5 MG PO TABS: 5.0000 mg | ORAL_TABLET | Freq: Every day | ORAL | Status: DC

## 2014-03-27 MED ORDER — ESOMEPRAZOLE MAGNESIUM 40 MG PO CPDR: 40.0000 mg | DELAYED_RELEASE_CAPSULE | Freq: Every day | ORAL | Status: DC

## 2014-03-27 NOTE — Progress Notes (Signed)
Subjective:    Patient ID: Donna Rhodes, female    DOB: 1946/12/11, 68 y.o.   MRN: [id number]  HPI 68 year old female with past history of hypertension and hypercholesterolemia who comes in today for a scheduled follow up.   Breathing stable.  No sinus or allergy symptoms.  No increased cough or congestion.  No acid reflux.  No bowel change.  No urinary symptoms.  Having some LLQ discomfort.  Planning to see Dr Enzo Bi tomorrow.  No change with bowel movements.   Was evaluated by urology.  Work up unrevealing.  She is off estrogen.   She has a history of phlebitis.  Tried estroven.  Did not tolerate.  Some issues with hot flashes.  We discussed treatment options.  She is going to try vitamin E.  Planning to retire in 6/15.  Swelling has improved (in her ankles).  Overall she feels she is doing relatively well.  Request a refill on the cream for her hands (eczema).      Past Medical History  Diagnosis Date  . Hypertension   . Hypothyroidism   . HCV infection   . HSV (herpes simplex virus) infection   . Lung nodule     stable  . Osteoarthritis     hand involvement, cervical disc disease  . Thyroid nodule   . Hypercholesterolemia   . GERD (gastroesophageal reflux disease)     Current Outpatient Prescriptions on File Prior to Visit  Medication Sig Dispense Refill  . amLODipine (NORVASC) 5 MG tablet TAKE 1 TABLET BY MOUTH ONCE A DAY  90 tablet  1  . levothyroxine (SYNTHROID, LEVOTHROID) 75 MCG tablet TAKE 1 TABLET BY MOUTH ONCE A DAY  90 tablet  3  . Multiple Vitamin (MULTIVITAMIN) capsule Take 1 capsule by mouth daily.      Marland Kitchen NEXIUM 40 MG capsule TAKE 1 CAPSULE BY MOUTH ONCE A DAY  90 capsule  1  . ranitidine (ZANTAC) 150 MG tablet Take 150 mg by mouth as needed.        No current facility-administered medications on file prior to visit.    Review of Systems Patient denies any headache, lightheadedness or dizziness.  No sinus or allergy symptoms.  No chest pain, tightness or  palpitations.  No increased shortness of breath or congestion.  No significant cough.  No nausea or vomiting.  Reflux controlled.  No abdominal pain or cramping.  No bowel change, such as diarrhea, constipation, BRBPR or melana.  No urine change.  Swelling improved.  Hot flashes as outlined.        Objective:   Physical Exam  Filed Vitals:   03/27/14 1008  BP: 110/70  Pulse: 64  Temp: 97.7 F (35.65 C)   68 year old female in no acute distress.   HEENT:  Nares- clear.  Oropharynx - without lesions. NECK:  Supple.  Nontender.  No audible bruit.  HEART:  Appears to be regular. LUNGS:  No crackles or wheezing audible.  Respirations even and unlabored.  RADIAL PULSE:  Equal bilaterally.  ABDOMEN:  Soft, nontender.  Bowel sounds present and normal.  No audible abdominal bruit.    EXTREMITIES:  No increased edema present.  DP pulses palpable and equal bilaterally.            Assessment & Plan:  MSK. Appears to be stable.  Follow.   VENOUS INSUFFICIENCY.  Has seen Dr Lucky Cowboy.  Doing well.  Continue support hose.    PREVIOUS ABNORMAL PAP SMEAR.  [facility].  Pap normal (4/13).  Now seeing Dr Enzo Bi.  Has f/u tomorrow.    GI.  Colonoscopy 10/23/13 - tortuous colon.  Recommended f/u colonoscopy n 10 years.    HEALTH MAINTENANCE.  Physical 05/29/13.   Pelvics/paps through GYN.  Colonoscopy as outlined.   Mammogram 03/27/13 - Birads II.  Schedule a f/u mammogram.

## 2014-03-27 NOTE — Assessment & Plan Note (Signed)
Involving [facility].  Had follow up chest CT 09/23/10 - stable.   Recommended no further scanning since stable for over two years.    

## 2014-03-27 NOTE — Assessment & Plan Note (Signed)
Blood pressure doing well.  Follow.  

## 2014-03-27 NOTE — Assessment & Plan Note (Signed)
Persistent facial lesion.  Refer to dermatology.

## 2014-03-27 NOTE — Assessment & Plan Note (Signed)
Increased stress as outlined.  Overall she feels she is doing well.

## 2014-03-27 NOTE — Assessment & Plan Note (Signed)
On synthroid.  recent TSH wnl.

## 2014-03-27 NOTE — Progress Notes (Signed)
Pre-visit discussion using our clinic review tool. No additional management support is needed unless otherwise documented below in the visit note.  

## 2014-03-27 NOTE — Telephone Encounter (Signed)
Pt needs refill on triamcinolone 0.1% cream Apply to affected area as directed  [facility]

## 2014-03-28 ENCOUNTER — Encounter: Payer: Self-pay | Admitting: *Deleted

## 2014-03-28 ENCOUNTER — Other Ambulatory Visit: Payer: Self-pay | Admitting: *Deleted

## 2014-03-28 MED ORDER — TRIAMCINOLONE 0.1 % CREAM:EUCERIN CREAM 1:1: TOPICAL_CREAM | CUTANEOUS | Status: DC

## 2014-03-28 NOTE — Telephone Encounter (Signed)
Okay to refill? 

## 2014-03-28 NOTE — Telephone Encounter (Signed)
Rx refilled.

## 2014-03-28 NOTE — Telephone Encounter (Signed)
Ok to refill x 1  

## 2014-03-31 ENCOUNTER — Encounter: Payer: Self-pay | Admitting: Internal Medicine

## 2014-03-31 DIAGNOSIS — R1032 Left lower quadrant pain: Secondary | ICD-10-CM | POA: Insufficient documentation

## 2014-03-31 DIAGNOSIS — N951 Menopausal and female climacteric states: Secondary | ICD-10-CM | POA: Insufficient documentation

## 2014-03-31 NOTE — Assessment & Plan Note (Signed)
No significant pain on exam.  Due to f/u with gyn tomorrow.

## 2014-03-31 NOTE — Assessment & Plan Note (Signed)
Off estrogen.  Discussed treatment options.  Will try vitamin E.

## 2014-03-31 NOTE — Assessment & Plan Note (Signed)
Low cholesterol diet and exercise.  Follow lipid profile.    

## 2014-04-09 ENCOUNTER — Other Ambulatory Visit: Payer: Self-pay | Admitting: Internal Medicine

## 2014-04-13 ENCOUNTER — Encounter: Payer: Self-pay | Admitting: Internal Medicine

## 2014-04-13 ENCOUNTER — Ambulatory Visit: Payer: Self-pay | Admitting: Internal Medicine

## 2014-04-13 LAB — HM MAMMOGRAPHY: HM Mammogram: NEGATIVE

## 2014-05-11 DIAGNOSIS — R87619 Unspecified abnormal cytological findings in specimens from cervix uteri: Secondary | ICD-10-CM | POA: Insufficient documentation

## 2014-06-22 ENCOUNTER — Encounter: Payer: 59 | Admitting: Internal Medicine

## 2014-08-02 ENCOUNTER — Other Ambulatory Visit: Payer: Self-pay | Admitting: *Deleted

## 2014-08-02 MED ORDER — LEVOTHYROXINE SODIUM 75 MCG PO TABS: ORAL_TABLET | ORAL | Status: DC

## 2014-08-02 MED ORDER — RANITIDINE HCL 150 MG PO TABS: 150.0000 mg | ORAL_TABLET | Freq: Every day | ORAL | Status: DC | PRN

## 2014-08-02 MED ORDER — AMLODIPINE BESYLATE 5 MG PO TABS: 5.0000 mg | ORAL_TABLET | Freq: Every day | ORAL | Status: DC

## 2014-08-03 ENCOUNTER — Ambulatory Visit (INDEPENDENT_AMBULATORY_CARE_PROVIDER_SITE_OTHER): Payer: 59 | Admitting: Adult Health

## 2014-08-03 ENCOUNTER — Encounter: Payer: Self-pay | Admitting: Adult Health

## 2014-08-03 VITALS — BP 132/80 | HR 71 | Temp 98.2°F | Resp 18 | Ht 63.25 in | Wt 121.8 lb

## 2014-08-03 DIAGNOSIS — R059 Cough, unspecified: Secondary | ICD-10-CM

## 2014-08-03 DIAGNOSIS — R05 Cough: Secondary | ICD-10-CM

## 2014-08-03 MED ORDER — BENZONATATE 200 MG PO CAPS: 200.0000 mg | ORAL_CAPSULE | Freq: Two times a day (BID) | ORAL | Status: DC | PRN

## 2014-08-03 MED ORDER — FLUTICASONE PROPIONATE 50 MCG/ACT NA SUSP: 2.0000 | Freq: Every day | NASAL | Status: DC

## 2014-08-03 NOTE — Patient Instructions (Signed)
  Tessalon 1 capsule twice a day as needed for cough.  Flonase nasal spray 2 sprays into each nostril daily.  Call if your symptoms are not any better by Tuesday or Wednesday.

## 2014-08-03 NOTE — Progress Notes (Signed)
Patient ID: Donna Rhodes, female   DOB: 1946/03/30, 68 y.o.   MRN: [id number]   Subjective:    Patient ID: Donna Rhodes, female    DOB: [birth year], 68 y.o.   MRN: [id number]  HPI Pt is a pleasant 68 y/o female who presents to clinic with a cough. She reports that she was spraying round up to kill weeds and she thinks she may have gotten some on her lips. She began to cough shortly afterward. No shortness of breath reported. Reports that she has some phlegm that it feels in coming from the right side of her chest. No fever reported. She did have chills 1 time but this subsided. The cough is a dry, irritating cough. She tried an OTC cough medication but reports it burned her stomach so she stopped taking it.   Past Medical History  Diagnosis Date  . Hypertension   . Hypothyroidism   . HCV infection   . HSV (herpes simplex virus) infection   . Lung nodule     stable  . Osteoarthritis     hand involvement, cervical disc disease  . Thyroid nodule   . Hypercholesterolemia   . GERD (gastroesophageal reflux disease)     Current Outpatient Prescriptions on File Prior to Visit  Medication Sig Dispense Refill  . amLODipine (NORVASC) 5 MG tablet Take 1 tablet (5 mg total) by mouth daily.  90 tablet  1  . esomeprazole (NEXIUM) 40 MG capsule Take 1 capsule (40 mg total) by mouth daily.  90 capsule  1  . levothyroxine (SYNTHROID, LEVOTHROID) 75 MCG tablet TAKE 1 TABLET BY MOUTH ONCE A DAY  90 tablet  2  . Triamcinolone Acetonide (TRIAMCINOLONE 0.1 % CREAM : EUCERIN) CREA Apply to affected area as needed  1 each  0  . Multiple Vitamin (MULTIVITAMIN) capsule Take 1 capsule by mouth daily.      . ranitidine (ZANTAC) 150 MG tablet Take 1 tablet (150 mg total) by mouth daily as needed.  90 tablet  1   No current facility-administered medications on file prior to visit.     Review of Systems  Constitutional: Positive for chills. Negative for fever, activity change and appetite change.    HENT: Negative for congestion, postnasal drip, rhinorrhea, sinus pressure and sore throat.   Respiratory: Positive for cough.   Cardiovascular: Negative.   Gastrointestinal: Negative.   Genitourinary: Negative.   Neurological: Negative.   All other systems reviewed and are negative.      Objective:  BP 132/80  Pulse 71  Temp(Src) 98.2 F (36.8 C) (Oral)  Resp 18  Ht 5' 3.25" (1.607 m)  Wt 121 lb 12 oz (55.225 kg)  BMI 21.38 kg/m2  SpO2 97%  LMP 12/29/1981   Physical Exam  Constitutional: She is oriented to person, place, and time. No distress.  HENT:  Head: Normocephalic and atraumatic.  Nose: Nose normal.  Mouth/Throat: Oropharynx is clear and moist.  Neck: No tracheal deviation present.  Cardiovascular: Normal rate, regular rhythm and normal heart sounds.   Pulmonary/Chest: Effort normal and breath sounds normal. No respiratory distress. She has no wheezes. She has no rales.  Musculoskeletal: Normal range of motion.  Lymphadenopathy:    She has no cervical adenopathy.  Neurological: She is alert and oriented to person, place, and time.  Skin: Skin is warm and dry.  Psychiatric: She has a normal mood and affect. Her behavior is normal. Judgment and thought content normal.      Assessment &  Plan:   1. Cough Possibly stated from irritation when using the round up. No s/s of infection at present. Will start tessalon for cough. Flonase nasal spray. RTC if no improvement within 4-5 days or sooner if necessary.

## 2014-08-03 NOTE — Progress Notes (Signed)
Pre-visit discussion using our clinic review tool. No additional management support is needed unless otherwise documented below in the visit note.  

## 2014-09-11 ENCOUNTER — Encounter: Payer: Self-pay | Admitting: Internal Medicine

## 2014-09-11 ENCOUNTER — Ambulatory Visit (INDEPENDENT_AMBULATORY_CARE_PROVIDER_SITE_OTHER): Payer: 59 | Admitting: Internal Medicine

## 2014-09-11 VITALS — BP 110/70 | HR 70 | Temp 97.9°F | Ht 63.5 in | Wt 124.5 lb

## 2014-09-11 DIAGNOSIS — M19049 Primary osteoarthritis, unspecified hand: Secondary | ICD-10-CM

## 2014-09-11 DIAGNOSIS — M79609 Pain in unspecified limb: Secondary | ICD-10-CM

## 2014-09-11 DIAGNOSIS — I10 Essential (primary) hypertension: Secondary | ICD-10-CM

## 2014-09-11 DIAGNOSIS — F3289 Other specified depressive episodes: Secondary | ICD-10-CM

## 2014-09-11 DIAGNOSIS — R87619 Unspecified abnormal cytological findings in specimens from cervix uteri: Secondary | ICD-10-CM

## 2014-09-11 DIAGNOSIS — J984 Other disorders of lung: Secondary | ICD-10-CM

## 2014-09-11 DIAGNOSIS — M19042 Primary osteoarthritis, left hand: Secondary | ICD-10-CM

## 2014-09-11 DIAGNOSIS — F329 Major depressive disorder, single episode, unspecified: Secondary | ICD-10-CM

## 2014-09-11 DIAGNOSIS — E7800 Pure hypercholesterolemia, unspecified: Secondary | ICD-10-CM

## 2014-09-11 DIAGNOSIS — E2839 Other primary ovarian failure: Secondary | ICD-10-CM

## 2014-09-11 DIAGNOSIS — E039 Hypothyroidism, unspecified: Secondary | ICD-10-CM

## 2014-09-11 DIAGNOSIS — M79606 Pain in leg, unspecified: Secondary | ICD-10-CM

## 2014-09-11 DIAGNOSIS — M19041 Primary osteoarthritis, right hand: Secondary | ICD-10-CM

## 2014-09-11 LAB — COMPREHENSIVE METABOLIC PANEL
ALBUMIN: 3.8 g/dL (ref 3.5–5.2)
ALK PHOS: 98 U/L (ref 39–117)
ALT: 21 U/L (ref 0–35)
AST: 22 U/L (ref 0–37)
BUN: 13 mg/dL (ref 6–23)
CALCIUM: 9.2 mg/dL (ref 8.4–10.5)
CO2: 29 meq/L (ref 19–32)
Chloride: 105 mEq/L (ref 96–112)
Creatinine, Ser: 0.7 mg/dL (ref 0.4–1.2)
GFR: 84.06 mL/min (ref >60.00)
Glucose, Bld: 83 mg/dL (ref 70–99)
POTASSIUM: 4.1 meq/L (ref 3.5–5.1)
Sodium: 140 mEq/L (ref 135–145)
TOTAL PROTEIN: 7 g/dL (ref 6.0–8.3)
Total Bilirubin: 0.5 mg/dL (ref 0.2–1.2)

## 2014-09-11 LAB — LIPID PANEL
Cholesterol: 199 mg/dL (ref 0–200)
HDL: 53.2 mg/dL (ref >39.00)
LDL Cholesterol: 128 mg/dL — ABNORMAL HIGH (ref 0–99)
NONHDL: 145.8
Total CHOL/HDL Ratio: 4
Triglycerides: 90 mg/dL (ref 0.0–149.0)
VLDL: 18 mg/dL (ref 0.0–40.0)

## 2014-09-11 LAB — MAGNESIUM: MAGNESIUM: 1.9 mg/dL (ref 1.5–2.5)

## 2014-09-11 LAB — VITAMIN B12: VITAMIN B 12: 436 pg/mL (ref 211–911)

## 2014-09-11 LAB — TSH: TSH: 2.11 u[IU]/mL (ref 0.35–4.50)

## 2014-09-11 NOTE — Progress Notes (Signed)
Subjective:    Patient ID: Donna Rhodes, female    DOB: September 27, 1946, 68 y.o.   MRN: [id number]  HPI 68 year old female with past history of hypertension and hypercholesterolemia who comes in today to follow up on these issues as well as for a complete physical exam.  Breathing stable.  No sinus or allergy symptoms.  No increased cough or congestion.  No acid reflux.  No bowel change.  No urinary symptoms.  No change with bowel movements.   Was evaluated by urology.  Work up unrevealing.  She is off estrogen.   She has a history of phlebitis.  She is having increased stiffness in her hands.  Left worse.  Increased pain - base of thumb.  Arthritis changes noted.  Also has a left foot bunion.  she does use voltaren gel.  Being followed for abnormal pap smear.  Due to f/u with Dr Enzo Bi 10/16/14.  She is being followed by Dr Evorn Gong.  Leg lesions.  Has retired.      Past Medical History  Diagnosis Date  . Hypertension   . Hypothyroidism   . HCV infection   . HSV (herpes simplex virus) infection   . Lung nodule     stable  . Osteoarthritis     hand involvement, cervical disc disease  . Thyroid nodule   . Hypercholesterolemia   . GERD (gastroesophageal reflux disease)     Current Outpatient Prescriptions on File Prior to Visit  Medication Sig Dispense Refill  . amLODipine (NORVASC) 5 MG tablet Take 1 tablet (5 mg total) by mouth daily.  90 tablet  1  . esomeprazole (NEXIUM) 40 MG capsule Take 1 capsule (40 mg total) by mouth daily.  90 capsule  1  . fluticasone (FLONASE) 50 MCG/ACT nasal spray Place 2 sprays into both nostrils daily.  16 g  1  . levothyroxine (SYNTHROID, LEVOTHROID) 75 MCG tablet TAKE 1 TABLET BY MOUTH ONCE A DAY  90 tablet  2  . Multiple Vitamin (MULTIVITAMIN) capsule Take 1 capsule by mouth daily.      . ranitidine (ZANTAC) 150 MG tablet Take 1 tablet (150 mg total) by mouth daily as needed.  90 tablet  1  . Triamcinolone Acetonide (TRIAMCINOLONE 0.1 % CREAM :  EUCERIN) CREA Apply to affected area as needed  1 each  0   No current facility-administered medications on file prior to visit.    Review of Systems Patient denies any headache, lightheadedness or dizziness.  No sinus or allergy symptoms.  No chest pain, tightness or palpitations.  No increased shortness of breath or congestion.  No cough.  No nausea or vomiting.  Reflux controlled.  No abdominal pain or cramping.  No bowel change, such as diarrhea, constipation, BRBPR or melana.  No urine change.  Hand stiffness as outlined.  Left foot bunion.  Due to f/u in 10/15 for abnormal pap.         Objective:   Physical Exam  Filed Vitals:   09/11/14 0933  BP: 110/70  Pulse: 70  Temp: 97.9 F (36.6 C)   Blood pressure recheck:  58/27  68 year old female in no acute distress.   HEENT:  Nares- clear.  Oropharynx - without lesions. NECK:  Supple.  Nontender.  No audible bruit.  HEART:  Appears to be regular. LUNGS:  No crackles or wheezing audible.  Respirations even and unlabored.  RADIAL PULSE:  Equal bilaterally.    BREASTS:  No nipple discharge or  nipple retraction present.  Could not appreciate any distinct nodules or axillary adenopathy.  ABDOMEN:  Soft, nontender.  Bowel sounds present and normal.  No audible abdominal bruit.  GU:  Performed by gyn.    EXTREMITIES:  No increased edema present.  DP pulses palpable and equal bilaterally.            Assessment & Plan:  VENOUS INSUFFICIENCY.  Has seen Dr Lucky Cowboy.  Doing well.  Continue support hose.    PREVIOUS ABNORMAL PAP SMEAR.  [facility].  Pap normal (4/13).  Now seeing Dr Enzo Bi.  Due f/u pap in 10/15.  appt already scheduled.    GI.  Colonoscopy 10/23/13 - tortuous colon.  Recommended f/u colonoscopy n 10 years.    HEALTH MAINTENANCE.  Physical today.   Pelvics/paps through GYN.  Colonoscopy as outlined.   Mammogram 04/13/14 - Birads I.  Due f/u bone density.  Schedule.    I spent more than 25 minutes  with the patient and more than 50% of the time was spent in consultation regarding the above.

## 2014-09-11 NOTE — Progress Notes (Signed)
Pre visit review using our clinic review tool, if applicable. No additional management support is needed unless otherwise documented below in the visit note. 

## 2014-09-12 LAB — CBC WITH DIFFERENTIAL/PLATELET
BASOS PCT: 0.5 % (ref 0.0–3.0)
Basophils Absolute: 0 10*3/uL (ref 0.0–0.1)
Eosinophils Absolute: 0.2 10*3/uL (ref 0.0–0.7)
Eosinophils Relative: 5.1 % — ABNORMAL HIGH (ref 0.0–5.0)
HEMATOCRIT: 37 % (ref 36.0–46.0)
Hemoglobin: 12.1 g/dL (ref 12.0–15.0)
Lymphocytes Relative: 29.2 % (ref 12.0–46.0)
Lymphs Abs: 1.3 10*3/uL (ref 0.7–4.0)
MCHC: 32.8 g/dL (ref 30.0–36.0)
MCV: 90.4 fl (ref 78.0–100.0)
MONO ABS: 0.5 10*3/uL (ref 0.1–1.0)
MONOS PCT: 10 % (ref 3.0–12.0)
Neutro Abs: 2.5 10*3/uL (ref 1.4–7.7)
Neutrophils Relative %: 55.2 % (ref 43.0–77.0)
Platelets: 243 10*3/uL (ref 150.0–400.0)
RBC: 4.09 Mil/uL (ref 3.87–5.11)
RDW: 13.4 % (ref 11.5–15.5)
WBC: 4.6 10*3/uL (ref 4.0–10.5)

## 2014-09-13 ENCOUNTER — Encounter: Payer: Self-pay | Admitting: *Deleted

## 2014-09-16 ENCOUNTER — Encounter: Payer: Self-pay | Admitting: Internal Medicine

## 2014-09-16 NOTE — Assessment & Plan Note (Signed)
Increased hand stiffness.  Desires no further w/up at this time.  Follow.

## 2014-09-16 NOTE — Assessment & Plan Note (Signed)
Had pap 04/26/14 (LGSIL with negative HPV).  Recommended f/u pap with HPV in 6 months.

## 2014-09-16 NOTE — Assessment & Plan Note (Signed)
On zoloft.  Stable.  Follow.  

## 2014-09-16 NOTE — Assessment & Plan Note (Signed)
On synthroid.  Follow tsh.   

## 2014-09-16 NOTE — Assessment & Plan Note (Signed)
Low cholesterol diet and exercise.  Follow lipid profile.

## 2014-09-16 NOTE — Assessment & Plan Note (Signed)
Involving [facility].  Had follow up chest CT 09/23/10 - stable.   Recommended no further scanning since stable for over two years.

## 2014-09-16 NOTE — Assessment & Plan Note (Signed)
Blood pressure doing well.  Follow.  

## 2014-09-21 LAB — HM DEXA SCAN: HM DEXA SCAN: NORMAL

## 2014-09-24 ENCOUNTER — Encounter: Payer: Self-pay | Admitting: *Deleted

## 2014-09-25 ENCOUNTER — Telehealth: Payer: Self-pay | Admitting: Internal Medicine

## 2014-09-25 ENCOUNTER — Encounter: Payer: Self-pay | Admitting: *Deleted

## 2014-09-25 NOTE — Telephone Encounter (Signed)
Left detailed message with results on home v/m per [facility]

## 2014-09-25 NOTE — Telephone Encounter (Signed)
Notify pt - bone density is normal.

## 2014-09-28 ENCOUNTER — Telehealth: Payer: Self-pay

## 2014-09-28 NOTE — Telephone Encounter (Signed)
Discussed results with pt, verbalized understanding.

## 2014-09-28 NOTE — Telephone Encounter (Signed)
The patient called and is hoping to discuss her bone density test and her lab results.  She stated she is hoping for more information.

## 2014-10-05 ENCOUNTER — Telehealth: Payer: Self-pay

## 2014-10-05 NOTE — Telephone Encounter (Signed)
The patient is hoping to get a referral to doctor DeFrancsico ([facility])

## 2014-10-23 ENCOUNTER — Ambulatory Visit: Payer: 59

## 2014-10-24 LAB — HM PAP SMEAR: HM Pap smear: NEGATIVE

## 2015-01-11 ENCOUNTER — Ambulatory Visit (INDEPENDENT_AMBULATORY_CARE_PROVIDER_SITE_OTHER): Payer: 59 | Admitting: Internal Medicine

## 2015-01-11 ENCOUNTER — Encounter: Payer: Self-pay | Admitting: Internal Medicine

## 2015-01-11 VITALS — BP 120/70 | HR 77 | Temp 98.5°F | Ht 63.5 in | Wt 128.5 lb

## 2015-01-11 DIAGNOSIS — R87619 Unspecified abnormal cytological findings in specimens from cervix uteri: Secondary | ICD-10-CM

## 2015-01-11 DIAGNOSIS — E039 Hypothyroidism, unspecified: Secondary | ICD-10-CM

## 2015-01-11 DIAGNOSIS — I10 Essential (primary) hypertension: Secondary | ICD-10-CM

## 2015-01-11 DIAGNOSIS — M19042 Primary osteoarthritis, left hand: Secondary | ICD-10-CM

## 2015-01-11 DIAGNOSIS — E7800 Pure hypercholesterolemia, unspecified: Secondary | ICD-10-CM

## 2015-01-11 DIAGNOSIS — M19041 Primary osteoarthritis, right hand: Secondary | ICD-10-CM

## 2015-01-11 NOTE — Progress Notes (Signed)
Subjective:    Patient ID: Donna Rhodes, female    DOB: 08-Mar-1946, 69 y.o.   MRN: [id number]  HPI 69 year old female with past history of hypertension and hypercholesterolemia who comes in today for a scheduled follow up.  Breathing stable.  No sinus or allergy symptoms.  No increased cough or congestion.  No acid reflux.  No bowel change.  No urinary symptoms.  Was evaluated by urology.  Work up unrevealing.  She is off estrogen.   She has a history of phlebitis.  She is having increased stiffness in her hands.  Left worse.  Increased pain - base of thumb.  Arthritis changes noted.  Saw Dr Amedeo Plenty.  Planning for surgery sometime next fall.  Also has a left foot bunion.  Seeing Dr Doran Durand for evaluation.  She does use voltaren gel.  Helps some.  Being followed for abnormal pap smear.  Sees Dr Roxan Hockey.  Last check ok.  Due f/u in 02/2015.  She is being followed by Dr Evorn Gong.  Has retired.  Overall she feels she is doing relatively well.      Past Medical History  Diagnosis Date  . Hypertension   . Hypothyroidism   . HCV infection   . HSV (herpes simplex virus) infection   . Lung nodule     stable  . Osteoarthritis     hand involvement, cervical disc disease  . Thyroid nodule   . Hypercholesterolemia   . GERD (gastroesophageal reflux disease)     Current Outpatient Prescriptions on File Prior to Visit  Medication Sig Dispense Refill  . amLODipine (NORVASC) 5 MG tablet Take 1 tablet (5 mg total) by mouth daily. 90 tablet 1  . esomeprazole (NEXIUM) 40 MG capsule Take 1 capsule (40 mg total) by mouth daily. 90 capsule 1  . folic acid (FOLVITE) 782 MCG tablet Take 400 mcg by mouth daily.    Marland Kitchen levothyroxine (SYNTHROID, LEVOTHROID) 75 MCG tablet TAKE 1 TABLET BY MOUTH ONCE A DAY 90 tablet 2  . ranitidine (ZANTAC) 150 MG tablet Take 1 tablet (150 mg total) by mouth daily as needed. 90 tablet 1  . Triamcinolone Acetonide (TRIAMCINOLONE 0.1 % CREAM : EUCERIN) CREA Apply to affected area as  needed 1 each 0   No current facility-administered medications on file prior to visit.    Review of Systems Patient denies any headache, lightheadedness or dizziness.  No sinus or allergy symptoms.  No chest pain, tightness or palpitations. No increased shortness of breath or congestion.  No cough.  No nausea or vomiting.  Reflux controlled.  No abdominal pain or cramping.  No bowel change, such as diarrhea, constipation, BRBPR or melana.  No urine change.  Hand stiffness as outlined.  Left foot bunion.   Seeing Dr Enzo Bi for abnormal pap smear. States she is handling stress relatively well.        Objective:   Physical Exam  Filed Vitals:   01/11/15 0959  BP: 120/70  Pulse: 77  Temp: 98.5 F (36.9 C)   Blood pressure recheck:  57/31  69 year old female in no acute distress.   HEENT:  Nares- clear.  Oropharynx - without lesions. NECK:  Supple.  Nontender.  No audible bruit.  HEART:  Appears to be regular. LUNGS:  No crackles or wheezing audible.  Respirations even and unlabored.  RADIAL PULSE:  Equal bilaterally. ABDOMEN:  Soft, nontender.  Bowel sounds present and normal.  No audible abdominal bruit.   EXTREMITIES:  No increased edema present.  DP pulses palpable and equal bilaterally.            Assessment & Plan:  Essential hypertension Blood pressure doing well.  Follow.  Same medication regimen.   - Comprehensive metabolic panel; Future  Hypothyroidism, unspecified hypothyroidism type On thyroid replacement.  Follow tsh.    Abnormal cervical Papanicolaou smear, unspecified abnormal pap finding Seeing Dr Enzo Bi.  Last checked in 09/2014.  Ok.  Due f/u in 02/2015.    Hypercholesterolemia Low cholesterol diet and exercise.  Follow lipid panel.   - Lipid panel; Future  Osteoarthritis of both hands, unspecified osteoarthritis type Saw Dr Amedeo Plenty.   Planning for surgery sometime next fall.    BUNION.  Planning to see Dr Doran Durand 02/06/15.    VENOUS  INSUFFICIENCY.  Has seen Dr Lucky Cowboy.  Doing well.  Continue support hose.    PREVIOUS ABNORMAL PAP SMEAR.  [facility].  Pap normal (4/13).  Now seeing Dr Enzo Bi. Had f/u pap in 10/15.  States ok.  Due f/u in 02/2015.     GI.  Colonoscopy 10/23/13 - tortuous colon.  Recommended f/u colonoscopy n 10 years.    HEALTH MAINTENANCE.  Physical 09/21/14.    Pelvics/paps through GYN.  Colonoscopy as outlined.   Mammogram 04/13/14 - Birads I.  Bone density 09/21/14 - normal.     I spent more than 25 minutes with the patient and more than 50% of the time was spent in consultation regarding the above.

## 2015-01-11 NOTE — Progress Notes (Signed)
Pre visit review using our clinic review tool, if applicable. No additional management support is needed unless otherwise documented below in the visit note. 

## 2015-01-14 ENCOUNTER — Encounter: Payer: Self-pay | Admitting: Internal Medicine

## 2015-01-18 ENCOUNTER — Other Ambulatory Visit: Payer: 59

## 2015-01-22 ENCOUNTER — Other Ambulatory Visit (INDEPENDENT_AMBULATORY_CARE_PROVIDER_SITE_OTHER): Payer: 59

## 2015-01-22 DIAGNOSIS — E7800 Pure hypercholesterolemia, unspecified: Secondary | ICD-10-CM

## 2015-01-22 DIAGNOSIS — I10 Essential (primary) hypertension: Secondary | ICD-10-CM

## 2015-01-22 LAB — LIPID PANEL
Cholesterol: 204 mg/dL — ABNORMAL HIGH (ref 0–200)
HDL: 64.4 mg/dL (ref >39.00)
LDL Cholesterol: 123 mg/dL — ABNORMAL HIGH (ref 0–99)
NONHDL: 139.6
Total CHOL/HDL Ratio: 3
Triglycerides: 84 mg/dL (ref 0.0–149.0)
VLDL: 16.8 mg/dL (ref 0.0–40.0)

## 2015-01-22 LAB — COMPREHENSIVE METABOLIC PANEL
ALBUMIN: 4 g/dL (ref 3.5–5.2)
ALT: 19 U/L (ref 0–35)
AST: 21 U/L (ref 0–37)
Alkaline Phosphatase: 90 U/L (ref 39–117)
BILIRUBIN TOTAL: 0.5 mg/dL (ref 0.2–1.2)
BUN: 13 mg/dL (ref 6–23)
CALCIUM: 9.1 mg/dL (ref 8.4–10.5)
CO2: 27 mEq/L (ref 19–32)
Chloride: 107 mEq/L (ref 96–112)
Creatinine, Ser: 0.73 mg/dL (ref 0.40–1.20)
GFR: 83.97 mL/min (ref >60.00)
GLUCOSE: 84 mg/dL (ref 70–99)
POTASSIUM: 3.9 meq/L (ref 3.5–5.1)
Sodium: 141 mEq/L (ref 135–145)
TOTAL PROTEIN: 7.2 g/dL (ref 6.0–8.3)

## 2015-01-23 ENCOUNTER — Encounter: Payer: Self-pay | Admitting: *Deleted

## 2015-02-19 ENCOUNTER — Other Ambulatory Visit: Payer: Self-pay | Admitting: Internal Medicine

## 2015-05-15 ENCOUNTER — Other Ambulatory Visit: Payer: Self-pay | Admitting: Internal Medicine

## 2015-05-15 DIAGNOSIS — Z1231 Encounter for screening mammogram for malignant neoplasm of breast: Secondary | ICD-10-CM

## 2015-05-17 ENCOUNTER — Ambulatory Visit: Payer: 59 | Admitting: Internal Medicine

## 2015-05-29 ENCOUNTER — Ambulatory Visit
Admission: RE | Admit: 2015-05-29 | Discharge: 2015-05-29 | Disposition: A | Discharge status: Home / Self Care | Payer: 59 | Source: Ambulatory Visit | Attending: Internal Medicine | Admitting: Internal Medicine

## 2015-05-29 ENCOUNTER — Other Ambulatory Visit: Payer: Self-pay | Admitting: Internal Medicine

## 2015-05-29 DIAGNOSIS — Z1231 Encounter for screening mammogram for malignant neoplasm of breast: Secondary | ICD-10-CM | POA: Insufficient documentation

## 2015-05-30 ENCOUNTER — Encounter: Payer: Self-pay | Admitting: Internal Medicine

## 2015-05-30 DIAGNOSIS — Z0000 Encounter for general adult medical examination without abnormal findings: Secondary | ICD-10-CM | POA: Insufficient documentation

## 2015-05-31 ENCOUNTER — Encounter: Payer: Self-pay | Admitting: Internal Medicine

## 2015-05-31 ENCOUNTER — Ambulatory Visit (INDEPENDENT_AMBULATORY_CARE_PROVIDER_SITE_OTHER): Payer: 59 | Admitting: Internal Medicine

## 2015-05-31 VITALS — BP 114/64 | HR 97 | Temp 98.0°F | Ht 63.5 in | Wt 126.0 lb

## 2015-05-31 DIAGNOSIS — I10 Essential (primary) hypertension: Secondary | ICD-10-CM

## 2015-05-31 DIAGNOSIS — F32A Depression, unspecified: Secondary | ICD-10-CM

## 2015-05-31 DIAGNOSIS — F329 Major depressive disorder, single episode, unspecified: Secondary | ICD-10-CM

## 2015-05-31 DIAGNOSIS — M19041 Primary osteoarthritis, right hand: Secondary | ICD-10-CM | POA: Diagnosis not present

## 2015-05-31 DIAGNOSIS — E039 Hypothyroidism, unspecified: Secondary | ICD-10-CM

## 2015-05-31 DIAGNOSIS — M19042 Primary osteoarthritis, left hand: Secondary | ICD-10-CM

## 2015-05-31 DIAGNOSIS — E7800 Pure hypercholesterolemia, unspecified: Secondary | ICD-10-CM

## 2015-05-31 DIAGNOSIS — Z0000 Encounter for general adult medical examination without abnormal findings: Secondary | ICD-10-CM

## 2015-05-31 DIAGNOSIS — R87619 Unspecified abnormal cytological findings in specimens from cervix uteri: Secondary | ICD-10-CM | POA: Diagnosis not present

## 2015-05-31 NOTE — Progress Notes (Signed)
Patient ID: Donna Rhodes, female   DOB: August 13, 1946, 69 y.o.   MRN: [id number]   Subjective:    Patient ID: Donna Rhodes, female    DOB: 23-Nov-1946, 69 y.o.   MRN: [id number]  HPI  Patient here for a scheduled follow up.  Increased stress with her daughter.  Daughter is living with her again.  Issues related to this.  Desires no further intervention.  Seeing Dr Enzo Bi for abnormal pap smear.  Will call to schedule follow up.  Stays active.  No cardiac symptoms with increased activity or exertion.  Breathing stable.  Eating and drinking well.  Bowels stable.     Past Medical History  Diagnosis Date  . Hypertension   . Hypothyroidism   . HCV infection   . HSV (herpes simplex virus) infection   . Lung nodule     stable  . Osteoarthritis     hand involvement, cervical disc disease  . Thyroid nodule   . Hypercholesterolemia   . GERD (gastroesophageal reflux disease)     Current Outpatient Prescriptions on File Prior to Visit  Medication Sig Dispense Refill  . amLODipine (NORVASC) 5 MG tablet TAKE 1 TABLET EVERY DAY 90 tablet 1  . esomeprazole (NEXIUM) 40 MG capsule Take 1 capsule (40 mg total) by mouth daily. 90 capsule 1  . levothyroxine (SYNTHROID, LEVOTHROID) 75 MCG tablet TAKE 1 TABLET BY MOUTH ONCE A DAY 90 tablet 2  . ranitidine (ZANTAC) 150 MG tablet Take 1 tablet (150 mg total) by mouth daily as needed. 90 tablet 1  . Triamcinolone Acetonide (TRIAMCINOLONE 0.1 % CREAM : EUCERIN) CREA Apply to affected area as needed 1 each 0   No current facility-administered medications on file prior to visit.    Review of Systems  Constitutional: Negative for appetite change and unexpected weight change.  HENT: Negative for congestion and sinus pressure.   Respiratory: Negative for cough, chest tightness and shortness of breath.   Cardiovascular: Negative for chest pain, palpitations and leg swelling (no leg swelling recently. ).  Gastrointestinal: Negative for nausea,  vomiting, abdominal pain and diarrhea.  Skin: Negative for color change and rash.  Neurological: Negative for dizziness, light-headedness and headaches.  Psychiatric/Behavioral: Negative for dysphoric mood and agitation.       Objective:    Physical Exam  Constitutional: She appears well-developed and well-nourished. No distress.  HENT:  Nose: Nose normal.  Mouth/Throat: Oropharynx is clear and moist.  Neck: Neck supple. No thyromegaly present.  Cardiovascular: Normal rate and regular rhythm.   Pulmonary/Chest: Breath sounds normal. No respiratory distress. She has no wheezes.  Abdominal: Soft. Bowel sounds are normal. There is no tenderness.  Musculoskeletal: She exhibits no edema or tenderness.  Lymphadenopathy:    She has no cervical adenopathy.  Skin: No rash noted. No erythema.  Psychiatric: She has a normal mood and affect. Her behavior is normal.    BP 114/64 mmHg  Pulse 97  Temp(Src) 98 F (36.7 C) (Oral)  Ht 5' 3.5" (1.613 m)  Wt 126 lb (57.153 kg)  BMI 21.97 kg/m2  SpO2 72%  LMP 12/29/1981 Wt Readings from Last 3 Encounters:  05/31/15 126 lb (57.153 kg)  01/11/15 128 lb 8 oz (58.287 kg)  09/11/14 124 lb 8 oz (56.473 kg)     Lab Results  Component Value Date   WBC 4.6 09/11/2014   HGB 12.1 09/11/2014   HCT 37.0 09/11/2014   PLT 243.0 09/11/2014   GLUCOSE 84 01/22/2015   CHOL  204* 01/22/2015   TRIG 84.0 01/22/2015   HDL 64.40 01/22/2015   LDLDIRECT 128.0 05/25/2013   LDLCALC 123* 01/22/2015   ALT 19 01/22/2015   AST 21 01/22/2015   NA 141 01/22/2015   K 3.9 01/22/2015   CL 107 01/22/2015   CREATININE 0.73 01/22/2015   BUN 13 01/22/2015   CO2 27 01/22/2015   TSH 2.11 09/11/2014    Mm Screening Breast Tomo Bilateral  05/29/2015   CLINICAL DATA:  Screening.  EXAM: DIGITAL SCREENING BILATERAL MAMMOGRAM WITH 3D TOMO WITH CAD  COMPARISON:  Previous exam(s).  ACR Breast Density Category c: The breast tissue is heterogeneously dense, which may obscure  small masses.  FINDINGS: There are no findings suspicious for malignancy. Images were processed with CAD.  IMPRESSION: No mammographic evidence of malignancy. A result letter of this screening mammogram will be mailed directly to the patient.  RECOMMENDATION: Screening mammogram in one year. (Code:[id number]-B-01Y)  BI-RADS CATEGORY  1: Negative.   Electronically Signed   By: Pamelia Hoit M.D.   On: 05/29/2015 19:40       Assessment & Plan:   Problem List Items Addressed This Visit    Abnormal Pap smear of cervix    Followed by gyn - Dr Enzo Bi.        Depression    Desires no further intervention at this time.  Follow.        Essential hypertension - Primary    Blood pressure doing well.  Same medication regimen.  Do not feel the amlodipine contributing to any leg swelling.  Has not had swelling recently.        Relevant Orders   Comprehensive metabolic panel   Health care maintenance    Gyn exams through Dr Enzo Bi.  [name] 05/29/15 - Birads I.  Physical 09/21/14.  colononscopy 10/23/13 - tortuous colon.  recommneded f/u in 10 years.        Hypercholesterolemia    Low cholesterol diet and exercise.  Follow lipid panel.        Relevant Orders   Lipid panel   Hypothyroidism    On thyroid replacement.  Follow tsh.       Relevant Orders   TSH   Osteoarthritis    Hand stiffness.  Desires no further evaluation or treatment.         I spent 25 minutes with the patient and more than 50% of the time was spent in consultation regarding the above.     Einar Pheasant, MD

## 2015-05-31 NOTE — Progress Notes (Signed)
Pre visit review using our clinic review tool, if applicable. No additional management support is needed unless otherwise documented below in the visit note. 

## 2015-06-02 ENCOUNTER — Encounter: Payer: Self-pay | Admitting: Internal Medicine

## 2015-06-02 NOTE — Assessment & Plan Note (Signed)
Desires no further intervention at this time.  Follow  

## 2015-06-02 NOTE — Assessment & Plan Note (Signed)
Gyn exams through Dr DeFrancesco.  Donna Rhodes 05/29/15 - Birads I.  Physical 09/21/14.  colononscopy 10/23/13 - tortuous colon.  recommneded f/u in 10 years.

## 2015-06-02 NOTE — Assessment & Plan Note (Signed)
Low cholesterol diet and exercise.  Follow lipid panel.   

## 2015-06-02 NOTE — Assessment & Plan Note (Signed)
Hand stiffness.  Desires no further evaluation or treatment.

## 2015-06-02 NOTE — Assessment & Plan Note (Signed)
Last CT stable.  Recommended no further scanning since stable for two years.

## 2015-06-02 NOTE — Assessment & Plan Note (Signed)
Followed by gyn - Dr DeFrancesco.

## 2015-06-02 NOTE — Assessment & Plan Note (Signed)
On thyroid replacement.  Follow tsh.  

## 2015-06-02 NOTE — Assessment & Plan Note (Signed)
Blood pressure doing well.  Same medication regimen.  Do not feel the amlodipine contributing to any leg swelling.  Has not had swelling recently.

## 2015-06-14 ENCOUNTER — Other Ambulatory Visit (INDEPENDENT_AMBULATORY_CARE_PROVIDER_SITE_OTHER): Payer: 59

## 2015-06-14 DIAGNOSIS — E7800 Pure hypercholesterolemia, unspecified: Secondary | ICD-10-CM

## 2015-06-14 DIAGNOSIS — E039 Hypothyroidism, unspecified: Secondary | ICD-10-CM

## 2015-06-14 DIAGNOSIS — I10 Essential (primary) hypertension: Secondary | ICD-10-CM

## 2015-06-14 LAB — COMPREHENSIVE METABOLIC PANEL
ALT: 16 U/L (ref 0–35)
AST: 21 U/L (ref 0–37)
Albumin: 4 g/dL (ref 3.5–5.2)
Alkaline Phosphatase: 89 U/L (ref 39–117)
BUN: 13 mg/dL (ref 6–23)
CHLORIDE: 106 meq/L (ref 96–112)
CO2: 25 mEq/L (ref 19–32)
Calcium: 9.2 mg/dL (ref 8.4–10.5)
Creatinine, Ser: 0.74 mg/dL (ref 0.40–1.20)
GFR: 82.56 mL/min (ref >60.00)
Glucose, Bld: 94 mg/dL (ref 70–99)
Potassium: 4.1 mEq/L (ref 3.5–5.1)
Sodium: 137 mEq/L (ref 135–145)
TOTAL PROTEIN: 7 g/dL (ref 6.0–8.3)
Total Bilirubin: 0.4 mg/dL (ref 0.2–1.2)

## 2015-06-14 LAB — LIPID PANEL
Cholesterol: 208 mg/dL — ABNORMAL HIGH (ref 0–200)
HDL: 63.6 mg/dL (ref >39.00)
LDL CALC: 132 mg/dL — AB (ref 0–99)
NonHDL: 144.4
TRIGLYCERIDES: 61 mg/dL (ref 0.0–149.0)
Total CHOL/HDL Ratio: 3
VLDL: 12.2 mg/dL (ref 0.0–40.0)

## 2015-06-14 LAB — TSH: TSH: 1.94 u[IU]/mL (ref 0.35–4.50)

## 2015-06-17 ENCOUNTER — Encounter: Payer: Self-pay | Admitting: *Deleted

## 2015-06-24 ENCOUNTER — Other Ambulatory Visit: Payer: Self-pay | Admitting: Internal Medicine

## 2015-09-30 ENCOUNTER — Ambulatory Visit (INDEPENDENT_AMBULATORY_CARE_PROVIDER_SITE_OTHER): Payer: Medicare PPO | Admitting: Internal Medicine

## 2015-09-30 ENCOUNTER — Encounter: Payer: Self-pay | Admitting: Internal Medicine

## 2015-09-30 VITALS — BP 112/70 | HR 72 | Temp 97.9°F | Resp 18 | Ht 63.5 in | Wt 128.1 lb

## 2015-09-30 DIAGNOSIS — F329 Major depressive disorder, single episode, unspecified: Secondary | ICD-10-CM

## 2015-09-30 DIAGNOSIS — E039 Hypothyroidism, unspecified: Secondary | ICD-10-CM

## 2015-09-30 DIAGNOSIS — M19042 Primary osteoarthritis, left hand: Secondary | ICD-10-CM

## 2015-09-30 DIAGNOSIS — F32A Depression, unspecified: Secondary | ICD-10-CM

## 2015-09-30 DIAGNOSIS — R87619 Unspecified abnormal cytological findings in specimens from cervix uteri: Secondary | ICD-10-CM

## 2015-09-30 DIAGNOSIS — E7800 Pure hypercholesterolemia, unspecified: Secondary | ICD-10-CM

## 2015-09-30 DIAGNOSIS — M19041 Primary osteoarthritis, right hand: Secondary | ICD-10-CM

## 2015-09-30 DIAGNOSIS — Z0000 Encounter for general adult medical examination without abnormal findings: Secondary | ICD-10-CM | POA: Diagnosis not present

## 2015-09-30 DIAGNOSIS — I10 Essential (primary) hypertension: Secondary | ICD-10-CM

## 2015-09-30 NOTE — Progress Notes (Signed)
Pre-visit discussion using our clinic review tool. No additional management support is needed unless otherwise documented below in the visit note.  

## 2015-09-30 NOTE — Progress Notes (Signed)
Patient ID: Donna Rhodes, female   DOB: July 05, 1946, 69 y.o.   MRN: [id number]   Subjective:    Patient ID: Donna Rhodes, female    DOB: 1946/03/03, 69 y.o.   MRN: [id number]  HPI  Patient with past history of hypertension, hypothyroidism and hypercholesterolemia.  She comes in today to follow up on these issues as well as for a complete physical exam.  She is under increased stress with her daughter's issues.  Discussed with her today.  She feels she is coping relatively well.  Desires no further intervention.  Stays active.  No cardiac symptoms with increased activity or exertion.  No sob.  No acid reflux reported.  No abdominal pain or cramping.  Bowels stable.  Is followed by Dr Enzo Bi for abnormal pap smear.  Increased pain in her hands and joints.  Has seen Dr Veronia Beets.     Past Medical History  Diagnosis Date  . Hypertension   . Hypothyroidism   . HCV infection   . HSV (herpes simplex virus) infection   . Lung nodule     stable  . Osteoarthritis     hand involvement, cervical disc disease  . Thyroid nodule   . Hypercholesterolemia   . GERD (gastroesophageal reflux disease)    Past Surgical History  Procedure Laterality Date  . Appendectomy  1965  . Tubal ligation  1977  . Finger surgery  1982  . Vaginal hysterectomy  1983    als0- anterior/posterior colporrhaphy  . Mandible reconstruction  1992  . Bunionectomy  1994    left  . Toenail excision  1998    several  . Vaginal wound closure / repair  1999, 2000  . Cholecystectomy      gallstone pancreatitis   Family History  Problem Relation Age of Onset  . Hypertension Father   . Osteoarthritis Mother   . Breast cancer Sister   . Arthritis/Rheumatoid Sister   . Breast cancer      paternal side  . Breast cancer Paternal Aunt   . Breast cancer Cousin     maternal   Social History   Social History  . Marital Status: Divorced    Spouse Name: N/A  . Number of Children: 3  . Years of Education: N/A    Social History Main Topics  . Smoking status: Former Smoker    Quit date: 12/29/1979  . Smokeless tobacco: Never Used  . Alcohol Use: No  . Drug Use: No  . Sexual Activity: Not Asked   Other Topics Concern  . None   Social History Narrative    Outpatient Encounter Prescriptions as of 09/30/2015  Medication Sig  . amLODipine (NORVASC) 5 MG tablet TAKE 1 TABLET EVERY DAY  . esomeprazole (NEXIUM) 40 MG capsule Take 1 capsule (40 mg total) by mouth daily.  Marland Kitchen levothyroxine (SYNTHROID, LEVOTHROID) 75 MCG tablet TAKE 1 TABLET ONE TIME DAILY  . ranitidine (ZANTAC) 150 MG tablet Take 1 tablet (150 mg total) by mouth daily as needed.  . [DISCONTINUED] Triamcinolone Acetonide (TRIAMCINOLONE 0.1 % CREAM : EUCERIN) CREA Apply to affected area as needed   No facility-administered encounter medications on file as of 09/30/2015.    Review of Systems  Constitutional: Negative for fever and appetite change.  HENT: Negative for congestion and sinus pressure.   Eyes: Negative for pain and visual disturbance.  Respiratory: Negative for cough, chest tightness and shortness of breath.   Cardiovascular: Negative for chest pain, palpitations and leg swelling.  Gastrointestinal: Negative for nausea, vomiting, abdominal pain and diarrhea.  Genitourinary: Negative for dysuria and difficulty urinating.  Musculoskeletal: Negative for back pain.       Joint pain as outlined.    Skin: Negative for color change and rash.  Neurological: Negative for dizziness, light-headedness and headaches.  Hematological: Negative for adenopathy. Does not bruise/bleed easily.  Psychiatric/Behavioral: Negative for dysphoric mood and agitation.       Objective:    Physical Exam  Constitutional: She is oriented to person, place, and time. She appears well-developed and well-nourished. No distress.  HENT:  Nose: Nose normal.  Mouth/Throat: Oropharynx is clear and moist.  Eyes: Right eye exhibits no discharge. Left eye  exhibits no discharge. No scleral icterus.  Neck: Neck supple. No thyromegaly present.  Cardiovascular: Normal rate and regular rhythm.   Pulmonary/Chest: Breath sounds normal. No accessory muscle usage. No tachypnea. No respiratory distress. She has no decreased breath sounds. She has no wheezes. She has no rhonchi. Right breast exhibits no inverted nipple, no mass, no nipple discharge and no tenderness (no axillary adenopathy). Left breast exhibits no inverted nipple, no mass, no nipple discharge and no tenderness (no axilarry adenopathy).  Abdominal: Soft. Bowel sounds are normal. There is no tenderness.  Musculoskeletal: She exhibits no edema or tenderness.  Lymphadenopathy:    She has no cervical adenopathy.  Neurological: She is alert and oriented to person, place, and time.  Skin: Skin is warm. No rash noted. No erythema.  Psychiatric: She has a normal mood and affect. Her behavior is normal.    BP 112/70 mmHg  Pulse 72  Temp(Src) 97.9 F (36.6 C) (Oral)  Resp 18  Ht 5' 3.5" (1.613 m)  Wt 128 lb 2 oz (58.117 kg)  BMI 22.34 kg/m2  SpO2 98%  LMP 12/29/1981 Wt Readings from Last 3 Encounters:  09/30/15 128 lb 2 oz (58.117 kg)  05/31/15 126 lb (57.153 kg)  01/11/15 128 lb 8 oz (58.287 kg)     Lab Results  Component Value Date   WBC 4.6 09/11/2014   HGB 12.1 09/11/2014   HCT 37.0 09/11/2014   PLT 243.0 09/11/2014   GLUCOSE 94 06/14/2015   CHOL 208* 06/14/2015   TRIG 61.0 06/14/2015   HDL 63.60 06/14/2015   LDLDIRECT 128.0 05/25/2013   LDLCALC 132* 06/14/2015   ALT 16 06/14/2015   AST 21 06/14/2015   NA 137 06/14/2015   K 4.1 06/14/2015   CL 106 06/14/2015   CREATININE 0.74 06/14/2015   BUN 13 06/14/2015   CO2 25 06/14/2015   TSH 1.94 06/14/2015    Mm Screening Breast Tomo Bilateral  05/29/2015   CLINICAL DATA:  Screening.  EXAM: DIGITAL SCREENING BILATERAL MAMMOGRAM WITH 3D TOMO WITH CAD  COMPARISON:  Previous exam(s).  ACR Breast Density Category c: The breast  tissue is heterogeneously dense, which may obscure small masses.  FINDINGS: There are no findings suspicious for malignancy. Images were processed with CAD.  IMPRESSION: No mammographic evidence of malignancy. A result letter of this screening mammogram will be mailed directly to the patient.  RECOMMENDATION: Screening mammogram in one year. (Code:SM-B-01Y)  BI-RADS CATEGORY  1: Negative.   Electronically Signed   By: Pamelia Hoit M.D.   On: 05/29/2015 19:40       Assessment & Plan:   Problem List Items Addressed This Visit    Abnormal Pap smear of cervix    Followed by Dr Enzo Bi.        Depression  Desires no further intervention at this time.  Follow.        Essential hypertension - Primary    Blood pressure under good control.  Continue same medication regimen.  Follow pressures.  Follow metabolic panel.        Relevant Orders   CBC with Differential/Platelet   Comprehensive metabolic panel   Health care maintenance    Physical 09/30/15.  Pap smears through Dr Enzo Bi.  [name] 05/29/15 - [facility] I.  Colonoscopy 10/23/13 - tortuous colon.  Recommended f/u colonoscopy in 10 years.        Hypercholesterolemia    Low cholesterol diet and exercise.  Follow lipid panel.        Relevant Orders   Lipid panel   Hypothyroidism    On thyroid replacement.  Follow tsh.        Osteoarthritis    Increased stiffness.  Desires no further intervention at this time.  Follow.           Einar Pheasant, MD

## 2015-10-06 ENCOUNTER — Encounter: Payer: Self-pay | Admitting: Internal Medicine

## 2015-10-06 NOTE — Assessment & Plan Note (Signed)
Desires no further intervention at this time.  Follow  

## 2015-10-06 NOTE — Assessment & Plan Note (Signed)
Blood pressure under good control.  Continue same medication regimen.  Follow pressures.  Follow metabolic panel.   

## 2015-10-06 NOTE — Assessment & Plan Note (Signed)
Physical 09/30/15.  Pap smears through Dr Enzo Bi.  [name] 05/29/15 - [facility] I.  Colonoscopy 10/23/13 - tortuous colon.  Recommended f/u colonoscopy in 10 years.

## 2015-10-06 NOTE — Assessment & Plan Note (Signed)
Low cholesterol diet and exercise.  Follow lipid panel.   

## 2015-10-06 NOTE — Assessment & Plan Note (Signed)
Increased stiffness.  Desires no further intervention at this time.  Follow.

## 2015-10-06 NOTE — Assessment & Plan Note (Signed)
On thyroid replacement.  Follow tsh.  

## 2015-10-06 NOTE — Assessment & Plan Note (Signed)
Followed by Dr Enzo Bi.

## 2015-10-16 ENCOUNTER — Encounter: Payer: Self-pay | Admitting: Obstetrics and Gynecology

## 2015-10-16 ENCOUNTER — Ambulatory Visit (INDEPENDENT_AMBULATORY_CARE_PROVIDER_SITE_OTHER): Payer: Medicare PPO | Admitting: Obstetrics and Gynecology

## 2015-10-16 VITALS — BP 125/75 | HR 83 | Ht 63.5 in | Wt 127.5 lb

## 2015-10-16 DIAGNOSIS — N952 Postmenopausal atrophic vaginitis: Secondary | ICD-10-CM | POA: Diagnosis not present

## 2015-10-16 DIAGNOSIS — R87612 Low grade squamous intraepithelial lesion on cytologic smear of cervix (LGSIL): Secondary | ICD-10-CM | POA: Diagnosis not present

## 2015-10-16 DIAGNOSIS — K219 Gastro-esophageal reflux disease without esophagitis: Secondary | ICD-10-CM

## 2015-10-16 DIAGNOSIS — A6000 Herpesviral infection of urogenital system, unspecified: Secondary | ICD-10-CM

## 2015-10-16 DIAGNOSIS — N951 Menopausal and female climacteric states: Secondary | ICD-10-CM | POA: Diagnosis not present

## 2015-10-16 DIAGNOSIS — Z90710 Acquired absence of both cervix and uterus: Secondary | ICD-10-CM | POA: Diagnosis not present

## 2015-10-16 DIAGNOSIS — Z1211 Encounter for screening for malignant neoplasm of colon: Secondary | ICD-10-CM

## 2015-10-16 MED ORDER — ESTRADIOL 1 MG PO TABS: 0.5000 mg | ORAL_TABLET | Freq: Every day | ORAL | Status: DC

## 2015-10-16 NOTE — Patient Instructions (Signed)
1.  Pap smear is done today because of history of LGSIL Pap. 2.  Estradiol is refilled.  Due to symptomatic menopause with vasomotor symptoms. 3.  Mammogram screening is taken care of by Dr. Nicki Reaper. 4.  Stool guaiac cards are given for colon cancer screening. 5.  Return in 1 year.

## 2015-10-16 NOTE — Progress Notes (Signed)
Patient ID: Donna Rhodes, female   DOB: August 10, 1946, 69 y.o.   MRN: [id number] Repeat pap only- Dr Nicki Reaper does her wellness Would like estradiol po for hot flashes.  Chief complaint: 1.  Vasomotor symptoms. 2.  Dysplasia  Patient presents for 6 month interval Pap smear.  Previous Pap was notable for LGSIL/negative findings. Patient also is experiencing vasomotor symptoms and would like to restart of estrogen replacement therapy.  Past medical history, past surgical history, problem list, medicines, and allergies are reviewed.  Review of systems: Per HPI.  OBJECTIVE: BP 125/75 mmHg  Pulse 83  Ht 5' 3.5" (1.613 m)  Wt 127 lb 8 oz (57.834 kg)  BMI 22.23 kg/m2  LMP 12/29/1981 Pleasant, well-appearing female in no acute distress.  She is alert and oriented. Abdomen: Soft, nontender. Pelvic: External genitalia-atrophy. BUS-normal Vagina-atrophic.No lesions Cervix-surgically absent Uterus-surgically absent. Bimanual exam-deferred.  IMPRESSION: 1.  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 2.  Vasomotor symptoms desiring started estrogen therapy. 3.  Osteoporosis  PLAN: 1.  Estradiol 1 mg prescribed. 2.  Pap smear completed. 3.  Return in 6 months for repeat Pap.  A total of 15 minutes were spent face-to-face with the patient during this encounter and over half of that time dealt with counseling and coordination of care.  Brayton Mars, MD

## 2015-10-18 ENCOUNTER — Encounter: Payer: Self-pay | Admitting: *Deleted

## 2015-10-18 ENCOUNTER — Other Ambulatory Visit (INDEPENDENT_AMBULATORY_CARE_PROVIDER_SITE_OTHER): Payer: Medicare PPO

## 2015-10-18 DIAGNOSIS — E7800 Pure hypercholesterolemia, unspecified: Secondary | ICD-10-CM | POA: Diagnosis not present

## 2015-10-18 DIAGNOSIS — I10 Essential (primary) hypertension: Secondary | ICD-10-CM | POA: Diagnosis not present

## 2015-10-18 LAB — COMPREHENSIVE METABOLIC PANEL
ALK PHOS: 89 U/L (ref 39–117)
ALT: 18 U/L (ref 0–35)
AST: 22 U/L (ref 0–37)
Albumin: 3.8 g/dL (ref 3.5–5.2)
BUN: 16 mg/dL (ref 6–23)
CO2: 26 meq/L (ref 19–32)
Calcium: 9.3 mg/dL (ref 8.4–10.5)
Chloride: 109 mEq/L (ref 96–112)
Creatinine, Ser: 0.83 mg/dL (ref 0.40–1.20)
GFR: 72.25 mL/min (ref >60.00)
GLUCOSE: 91 mg/dL (ref 70–99)
POTASSIUM: 4.3 meq/L (ref 3.5–5.1)
SODIUM: 141 meq/L (ref 135–145)
TOTAL PROTEIN: 7.2 g/dL (ref 6.0–8.3)
Total Bilirubin: 0.4 mg/dL (ref 0.2–1.2)

## 2015-10-18 LAB — CBC WITH DIFFERENTIAL/PLATELET
BASOS ABS: 0.1 10*3/uL (ref 0.0–0.1)
Basophils Relative: 1.3 % (ref 0.0–3.0)
Eosinophils Absolute: 0.2 10*3/uL (ref 0.0–0.7)
Eosinophils Relative: 4.5 % (ref 0.0–5.0)
HCT: 37.7 % (ref 36.0–46.0)
Hemoglobin: 12.3 g/dL (ref 12.0–15.0)
LYMPHS ABS: 1.8 10*3/uL (ref 0.7–4.0)
Lymphocytes Relative: 33.7 % (ref 12.0–46.0)
MCHC: 32.7 g/dL (ref 30.0–36.0)
MCV: 90 fl (ref 78.0–100.0)
MONO ABS: 0.6 10*3/uL (ref 0.1–1.0)
Monocytes Relative: 10.5 % (ref 3.0–12.0)
NEUTROS ABS: 2.6 10*3/uL (ref 1.4–7.7)
NEUTROS PCT: 50 % (ref 43.0–77.0)
PLATELETS: 276 10*3/uL (ref 150.0–400.0)
RBC: 4.19 Mil/uL (ref 3.87–5.11)
RDW: 13.8 % (ref 11.5–15.5)
WBC: 5.3 10*3/uL (ref 4.0–10.5)

## 2015-10-18 LAB — LIPID PANEL
CHOL/HDL RATIO: 3
Cholesterol: 179 mg/dL (ref 0–200)
HDL: 57 mg/dL (ref >39.00)
LDL Cholesterol: 107 mg/dL — ABNORMAL HIGH (ref 0–99)
NONHDL: 122.37
Triglycerides: 78 mg/dL (ref 0.0–149.0)
VLDL: 15.6 mg/dL (ref 0.0–40.0)

## 2015-10-20 DIAGNOSIS — A6000 Herpesviral infection of urogenital system, unspecified: Secondary | ICD-10-CM | POA: Insufficient documentation

## 2015-10-20 DIAGNOSIS — K219 Gastro-esophageal reflux disease without esophagitis: Secondary | ICD-10-CM | POA: Insufficient documentation

## 2015-10-20 DIAGNOSIS — Z90710 Acquired absence of both cervix and uterus: Secondary | ICD-10-CM | POA: Insufficient documentation

## 2015-10-30 LAB — PAP IG AND HPV HIGH-RISK: PAP SMEAR COMMENT: 0

## 2015-11-11 ENCOUNTER — Other Ambulatory Visit: Payer: Self-pay | Admitting: Obstetrics and Gynecology

## 2015-11-13 ENCOUNTER — Encounter: Payer: Self-pay | Admitting: Obstetrics and Gynecology

## 2015-11-13 ENCOUNTER — Ambulatory Visit (INDEPENDENT_AMBULATORY_CARE_PROVIDER_SITE_OTHER): Payer: Medicare PPO | Admitting: Obstetrics and Gynecology

## 2015-11-13 VITALS — BP 152/86 | HR 82 | Ht 63.5 in | Wt 127.8 lb

## 2015-11-13 DIAGNOSIS — R87612 Low grade squamous intraepithelial lesion on cytologic smear of cervix (LGSIL): Secondary | ICD-10-CM

## 2015-11-13 NOTE — Progress Notes (Signed)
Chief complaint: 1.  LGSIL/Positive Pap smear(110/19/2016). 2.  Long history of abnormal Pap smears. 3.  Status post hysterectomy.  Nonsmoker. No history of new partners; last, 10 years ago.  COLPOSCOPY: Acetic acid is applied topically to the vagina.  There is evidence of branching blood vessels on the left side of the vaginal cuff.  (See geographic mapping for details). Impression: 1.  LGSIL/Positive Pap smear. 2.  Abnormal vessels noted on the left aspect of cuff. Plan: 1.  Vaginal cuff biopsy. 2.  Return in 1 week for follow-up and further management. 3.  If high-grade dysplasia is identified, we will consider Efudex treatment.  Brayton Mars, MD  Note: This dictation was prepared with Dragon dictation along with smaller phrase technology. Any transcriptional errors that result from this process are unintentional.

## 2015-11-13 NOTE — Addendum Note (Signed)
Addended by: Elouise Munroe on: 11/13/2015 04:46 PM   Modules accepted: Orders

## 2015-11-19 LAB — PATHOLOGY

## 2015-11-20 ENCOUNTER — Encounter: Payer: Medicare PPO | Admitting: Obstetrics and Gynecology

## 2015-11-20 ENCOUNTER — Telehealth: Payer: Self-pay | Admitting: Obstetrics and Gynecology

## 2015-11-20 NOTE — Telephone Encounter (Signed)
Pt aware bx neg- per mad repeat pap 6 months- appt made for 05/19/2016 at 11:00

## 2015-11-20 NOTE — Telephone Encounter (Signed)
Donna Rhodes wanted to know the results of her test. Please call before 2 pm she is leaving,

## 2015-11-25 ENCOUNTER — Encounter: Payer: Self-pay | Admitting: Obstetrics and Gynecology

## 2015-11-25 ENCOUNTER — Other Ambulatory Visit: Payer: Self-pay | Admitting: Orthopedic Surgery

## 2015-11-25 DIAGNOSIS — S83221A Peripheral tear of medial meniscus, current injury, right knee, initial encounter: Secondary | ICD-10-CM

## 2015-12-03 ENCOUNTER — Ambulatory Visit
Admission: RE | Admit: 2015-12-03 | Discharge: 2015-12-03 | Disposition: A | Discharge status: Home / Self Care | Payer: Medicare PPO | Source: Ambulatory Visit | Attending: Orthopedic Surgery | Admitting: Orthopedic Surgery

## 2015-12-03 ENCOUNTER — Other Ambulatory Visit: Payer: Self-pay | Admitting: Orthopedic Surgery

## 2015-12-03 DIAGNOSIS — M23306 Other meniscus derangements, unspecified meniscus, right knee: Secondary | ICD-10-CM | POA: Diagnosis not present

## 2015-12-03 DIAGNOSIS — M660 Rupture of popliteal cyst: Secondary | ICD-10-CM | POA: Diagnosis not present

## 2015-12-03 DIAGNOSIS — M1711 Unilateral primary osteoarthritis, right knee: Secondary | ICD-10-CM | POA: Diagnosis not present

## 2015-12-03 DIAGNOSIS — M25561 Pain in right knee: Secondary | ICD-10-CM | POA: Diagnosis present

## 2015-12-03 DIAGNOSIS — M25461 Effusion, right knee: Secondary | ICD-10-CM | POA: Diagnosis not present

## 2015-12-12 ENCOUNTER — Ambulatory Visit: Payer: Medicare PPO

## 2016-01-03 DIAGNOSIS — M0609 Rheumatoid arthritis without rheumatoid factor, multiple sites: Secondary | ICD-10-CM | POA: Diagnosis not present

## 2016-01-03 DIAGNOSIS — M150 Primary generalized (osteo)arthritis: Secondary | ICD-10-CM | POA: Diagnosis not present

## 2016-01-03 DIAGNOSIS — M1990 Unspecified osteoarthritis, unspecified site: Secondary | ICD-10-CM | POA: Diagnosis not present

## 2016-01-03 DIAGNOSIS — Z79899 Other long term (current) drug therapy: Secondary | ICD-10-CM | POA: Diagnosis not present

## 2016-01-06 ENCOUNTER — Telehealth: Payer: Self-pay | Admitting: Internal Medicine

## 2016-01-06 NOTE — Telephone Encounter (Signed)
I can see her on 01/13/16 at 3:00 - block 30 minutes.  Let me know if she needs to be seen earlier.

## 2016-01-06 NOTE — Telephone Encounter (Signed)
Pt called about needing to be seen regarding rheumatoid arthritis . Pt does not want to see anyone else. Pt has an appt in Feb but wants to be seen this month. Call pt @ [phone]. Thank You!

## 2016-01-06 NOTE — Telephone Encounter (Signed)
Please advise a visit sooner?

## 2016-01-07 NOTE — Telephone Encounter (Signed)
Spoke with the Patient.  She went and saw Dr. Juleen China last week.  He stated that her sed rate was high but wouldn't tell her what the exact number was.  He started her on Methotrexate 6pills a week.  Patient started this on Friday last week and is very concerned with the side effects of the medication.  She will take the appointment on the 16th but is willing to come in if anyone cancels before then.  I have scheduled for the 16th now.

## 2016-01-07 NOTE — Telephone Encounter (Signed)
Noted, but if she has concerns about the medication, I would also recommend her calling Dr Jefm Bryant for advice as well.

## 2016-01-13 ENCOUNTER — Ambulatory Visit (INDEPENDENT_AMBULATORY_CARE_PROVIDER_SITE_OTHER): Payer: PPO | Admitting: Internal Medicine

## 2016-01-13 ENCOUNTER — Encounter: Payer: Self-pay | Admitting: Internal Medicine

## 2016-01-13 VITALS — BP 128/80 | HR 80 | Temp 98.0°F | Resp 18 | Ht 63.5 in | Wt 125.2 lb

## 2016-01-13 DIAGNOSIS — K219 Gastro-esophageal reflux disease without esophagitis: Secondary | ICD-10-CM

## 2016-01-13 DIAGNOSIS — E039 Hypothyroidism, unspecified: Secondary | ICD-10-CM

## 2016-01-13 DIAGNOSIS — M1990 Unspecified osteoarthritis, unspecified site: Secondary | ICD-10-CM | POA: Diagnosis not present

## 2016-01-13 DIAGNOSIS — I10 Essential (primary) hypertension: Secondary | ICD-10-CM

## 2016-01-13 DIAGNOSIS — F32A Depression, unspecified: Secondary | ICD-10-CM

## 2016-01-13 DIAGNOSIS — M25561 Pain in right knee: Secondary | ICD-10-CM

## 2016-01-13 DIAGNOSIS — F329 Major depressive disorder, single episode, unspecified: Secondary | ICD-10-CM

## 2016-01-13 NOTE — Progress Notes (Signed)
Pre-visit discussion using our clinic review tool. No additional management support is needed unless otherwise documented below in the visit note.  

## 2016-01-13 NOTE — Progress Notes (Signed)
Patient ID: Donna Rhodes, female   DOB: [birth year], 70 y.o.   MRN: [id number]   Subjective:    Patient ID: Donna Rhodes, female    DOB: May 14, 1946, 70 y.o.   MRN: [id number]  HPI  Patient with past history of hypercholesterolemia, GERD, hypertension and hypothyroidism.  Also diagnosed with seronegative inflammatory arthritis, followed by Dr Jefm Bryant.  Placed on MTX, prednisone and folic acid.  She comes in today to discuss this diagnosis and the treatment. Reports had problems with right knee pain.  Saw [facility]. Had xray.  Placed on meloxicam.  Caused nausea.  Stopped the medication.  Was evaluated by [facility] (Dr Cay Schillings).  Had MRI.  Found a tear.  Had injection 12/18/15.  Wearing a brace.  12/25/15 - increased pain in right arm shoulder and left arm.  Unable to brush hair, etc.  Saw Dr Jefm Bryant.  Placed on prednisone.  Had labs.  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  Is on MTX and prednisone now.  Is feeling better.  Had a lot of questions about the medication.  Eating and drinking ok.  Bowels stable.  Increased stress with her daughter's situation.  She is back living with her.     Past Medical History  Diagnosis Date  . Hypertension   . Hypothyroidism   . HCV infection   . HSV (herpes simplex virus) infection   . Lung nodule     stable  . Osteoarthritis     hand involvement, cervical disc disease  . Thyroid nodule   . Hypercholesterolemia   . GERD (gastroesophageal reflux disease)   . Vaginal Pap smear, abnormal     lgsil  . Osteoporosis   . Atrophic vaginitis   . Pelvic pain in female   . 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 Menopausal state    Past Surgical History  Procedure Laterality Date  . Appendectomy  1965  . Tubal ligation  1977  . Finger surgery  1982  . Mandible reconstruction  1992  . Bunionectomy  1994    left  . Toenail excision  1998    several  . Vaginal wound closure / repair  1999, 2000  .  Cholecystectomy    . Vaginal hysterectomy  1983    als0- anterior/posterior colporrhaphy  . Adenoidectomy    . Laser vein surgery    . Vaginal wound closure / repair     Family History  Problem Relation Age of Onset  . Hypertension Father   . Osteoarthritis Mother   . Breast cancer Sister   . Arthritis/Rheumatoid Sister   . Breast cancer      paternal side  . Breast cancer Paternal Aunt   . Breast cancer Cousin     maternal  . Uterine cancer Maternal Grandmother   . Ovarian cancer Neg Hx   . Heart disease Neg Hx   . Diabetes Neg Hx   . Colon cancer Neg Hx    Social History   Social History  . Marital Status: Divorced    Spouse Name: N/A  . Number of Children: 3  . Years of Education: N/A   Social History Main Topics  . Smoking status: Former Smoker    Quit date: 12/29/1979  . Smokeless tobacco: Never Used  . Alcohol Use: No  . Drug Use: No  . Sexual Activity: Not Currently    Birth Control/ Protection: Surgical   Other Topics Concern  .  None   Social History Narrative    Outpatient Encounter Prescriptions as of 01/13/2016  Medication Sig  . acyclovir (ZOVIRAX) 200 MG capsule TAKE 1 CAPSULE TWICE DAILY  . amLODipine (NORVASC) 5 MG tablet TAKE 1 TABLET EVERY DAY  . calcium-vitamin D (OSCAL WITH D) 250-125 MG-UNIT tablet Take 1 tablet by mouth daily.  Marland Kitchen estradiol (ESTRACE VAGINAL) 0.1 MG/GM vaginal cream Place 1 Applicatorful vaginally at bedtime.  Marland Kitchen estradiol (ESTRACE) 1 MG tablet Take 0.5 tablets (0.5 mg total) by mouth daily.  . folic acid (FOLVITE) 1 MG tablet Take 1 mg by mouth daily.  Marland Kitchen levothyroxine (SYNTHROID, LEVOTHROID) 75 MCG tablet TAKE 1 TABLET ONE TIME DAILY  . methotrexate (RHEUMATREX) 2.5 MG tablet Take 15 mg by mouth once a week. Caution:Chemotherapy. Protect from light.  . predniSONE (DELTASONE) 5 MG tablet Take 5 mg by mouth 2 (two) times daily with a meal.  . ranitidine (ZANTAC) 150 MG tablet Take 1 tablet (150 mg total) by mouth daily as  needed.   No facility-administered encounter medications on file as of 01/13/2016.    Review of Systems  Constitutional: Negative for appetite change and unexpected weight change.  HENT: Negative for congestion and sinus pressure.   Eyes: Negative for discharge and redness.  Respiratory: Negative for cough, chest tightness and shortness of breath.   Cardiovascular: Negative for chest pain, palpitations and leg swelling.  Gastrointestinal: Negative for nausea, vomiting, abdominal pain and diarrhea.  Genitourinary: Negative for dysuria and difficulty urinating.  Musculoskeletal:       Joint stiffness as outlined.  Increased knee pain.   Skin: Negative for color change and rash.  Neurological: Negative for dizziness, light-headedness and headaches.  Psychiatric/Behavioral: Negative for dysphoric mood and agitation.       Objective:    Physical Exam  Constitutional: She appears well-developed and well-nourished. No distress.  HENT:  Nose: Nose normal.  Mouth/Throat: Oropharynx is clear and moist.  Eyes: Conjunctivae are normal. Right eye exhibits no discharge. Left eye exhibits no discharge.  Neck: Neck supple. No thyromegaly present.  Cardiovascular: Normal rate and regular rhythm.   Pulmonary/Chest: Breath sounds normal. No respiratory distress. She has no wheezes.  Abdominal: Soft. Bowel sounds are normal. There is no tenderness.  Musculoskeletal: She exhibits no edema or tenderness.  Lymphadenopathy:    She has no cervical adenopathy.  Skin: No rash noted. No erythema.  Psychiatric: She has a normal mood and affect. Her behavior is normal.    BP 128/80 mmHg  Pulse 80  Temp(Src) 98 F (36.7 C) (Oral)  Resp 18  Ht 5' 3.5" (1.613 m)  Wt 125 lb 4 oz (56.813 kg)  BMI 21.84 kg/m2  SpO2 97%  LMP 12/29/1981 Wt Readings from Last 3 Encounters:  01/13/16 125 lb 4 oz (56.813 kg)  12/03/15 127 lb (57.607 kg)  11/13/15 127 lb 12.8 oz (57.97 kg)     Lab Results  Component  Value Date   WBC 5.3 10/18/2015   HGB 12.3 10/18/2015   HCT 37.7 10/18/2015   PLT 276.0 10/18/2015   GLUCOSE 91 10/18/2015   CHOL 179 10/18/2015   TRIG 78.0 10/18/2015   HDL 57.00 10/18/2015   LDLDIRECT 128.0 05/25/2013   LDLCALC 107* 10/18/2015   ALT 18 10/18/2015   AST 22 10/18/2015   NA 141 10/18/2015   K 4.3 10/18/2015   CL 109 10/18/2015   CREATININE 0.83 10/18/2015   BUN 16 10/18/2015   CO2 26 10/18/2015   TSH 1.94 06/14/2015  Mr Knee Right Wo Contrast  12/03/2015  CLINICAL DATA:  Twisting injury 1 month ago. Persistent medial knee pain. EXAM: MRI OF THE RIGHT KNEE WITHOUT CONTRAST TECHNIQUE: Multiplanar, multisequence MR imaging of the knee was performed. No intravenous contrast was administered. COMPARISON:  None. FINDINGS: MENISCI Medial meniscus: Intrasubstance degenerative type signal changes but no discrete meniscal tear. Lateral meniscus: Moderate intrasubstance degenerative type signal changes but no discrete tear. LIGAMENTS Cruciates:  Intact.  Mild mucoid degeneration of the ACL. Collaterals:  Intact. CARTILAGE Patellofemoral:  Mild degenerative chondrosis/ chondromalacia. Medial:  Mild degenerative chondrosis/ chondromalacia for age. Lateral:  Mild degenerative chondrosis/ chondromalacia for age. Joint: There is a moderate-sized joint effusion and moderate synovitis. Popliteal Fossa:  Small to moderate-sized leaking Baker's cyst. Extensor Mechanism: The patella retinacular structures are intact and the quadriceps and patellar tendons are intact. Bones:  No acute bony findings. IMPRESSION: 1. Minimal degenerative changes for age. 2. Intact ligamentous structures and no acute bony findings. 3. Meniscal degenerative changes without discrete tear. 4. Moderate-sized joint effusion and moderate synovitis. There is also a small to moderate-sized leaking Baker's cyst. Electronically Signed   By: Marijo Sanes M.D.   On: 12/03/2015 16:31       Assessment & Plan:   Problem List  Items Addressed This Visit    Depression    Increased stress as outlined.  Discussed with her today.  She desires no further intervention.  Follow closely        Essential hypertension - Primary    Blood pressure under good control.  Continue same medication regimen.  Follow pressures.  Follow metabolic panel.        GERD (gastroesophageal reflux disease)    On zantac.  Symptoms controlled.        Hypothyroidism    On thyroid replacement.  Follow tsh.        Inflammatory arthritis (HCC)    Seeing Dr Jefm Bryant.  On MTX and prednisone.  Feeling better.  Discussed with her at length today.  Follow.        Relevant Medications   predniSONE (DELTASONE) 5 MG tablet   methotrexate (RHEUMATREX) 2.5 MG tablet   Right knee pain    Persistent.  Saw ortho.  Continue f/u with ortho.  S/p injection.            Einar Pheasant, MD

## 2016-01-19 ENCOUNTER — Encounter: Payer: Self-pay | Admitting: Internal Medicine

## 2016-01-19 DIAGNOSIS — M25561 Pain in right knee: Secondary | ICD-10-CM | POA: Insufficient documentation

## 2016-01-19 DIAGNOSIS — M1990 Unspecified osteoarthritis, unspecified site: Secondary | ICD-10-CM | POA: Insufficient documentation

## 2016-01-19 NOTE — Assessment & Plan Note (Signed)
On thyroid replacement.  Follow tsh.  

## 2016-01-19 NOTE — Assessment & Plan Note (Signed)
Seeing Dr Jefm Bryant.  On MTX and prednisone.  Feeling better.  Discussed with her at length today.  Follow.

## 2016-01-19 NOTE — Assessment & Plan Note (Signed)
Blood pressure under good control.  Continue same medication regimen.  Follow pressures.  Follow metabolic panel.   

## 2016-01-19 NOTE — Assessment & Plan Note (Signed)
Persistent.  Saw ortho.  Continue f/u with ortho.  S/p injection.

## 2016-01-19 NOTE — Assessment & Plan Note (Signed)
On zantac.  Symptoms controlled.   

## 2016-01-19 NOTE — Assessment & Plan Note (Signed)
Increased stress as outlined.  Discussed with her today.  She desires no further intervention.  Follow closely

## 2016-01-29 DIAGNOSIS — M0609 Rheumatoid arthritis without rheumatoid factor, multiple sites: Secondary | ICD-10-CM | POA: Diagnosis not present

## 2016-01-29 DIAGNOSIS — Z79899 Other long term (current) drug therapy: Secondary | ICD-10-CM | POA: Diagnosis not present

## 2016-01-31 ENCOUNTER — Ambulatory Visit (INDEPENDENT_AMBULATORY_CARE_PROVIDER_SITE_OTHER): Payer: PPO | Admitting: Internal Medicine

## 2016-01-31 ENCOUNTER — Telehealth: Payer: Self-pay | Admitting: Internal Medicine

## 2016-01-31 ENCOUNTER — Encounter: Payer: Self-pay | Admitting: Internal Medicine

## 2016-01-31 VITALS — BP 128/70 | HR 74 | Temp 98.0°F | Resp 18 | Ht 63.5 in | Wt 126.1 lb

## 2016-01-31 DIAGNOSIS — K219 Gastro-esophageal reflux disease without esophagitis: Secondary | ICD-10-CM

## 2016-01-31 DIAGNOSIS — F329 Major depressive disorder, single episode, unspecified: Secondary | ICD-10-CM

## 2016-01-31 DIAGNOSIS — I10 Essential (primary) hypertension: Secondary | ICD-10-CM | POA: Diagnosis not present

## 2016-01-31 DIAGNOSIS — K644 Residual hemorrhoidal skin tags: Secondary | ICD-10-CM

## 2016-01-31 DIAGNOSIS — E7800 Pure hypercholesterolemia, unspecified: Secondary | ICD-10-CM

## 2016-01-31 DIAGNOSIS — M1990 Unspecified osteoarthritis, unspecified site: Secondary | ICD-10-CM

## 2016-01-31 DIAGNOSIS — E039 Hypothyroidism, unspecified: Secondary | ICD-10-CM | POA: Diagnosis not present

## 2016-01-31 DIAGNOSIS — F32A Depression, unspecified: Secondary | ICD-10-CM

## 2016-01-31 MED ORDER — HYDROCORTISONE ACE-PRAMOXINE 1-1 % RE CREA: 1.0000 "application " | TOPICAL_CREAM | Freq: Two times a day (BID) | RECTAL | Status: DC

## 2016-01-31 MED ORDER — RANITIDINE HCL 150 MG PO TABS: 150.0000 mg | ORAL_TABLET | Freq: Two times a day (BID) | ORAL | Status: DC

## 2016-01-31 NOTE — Assessment & Plan Note (Signed)
Some increased epigastric acid at times.  Increase zantac to bid.  Get her back in soon to reassess.  Intermittent problem.

## 2016-01-31 NOTE — Telephone Encounter (Signed)
Pt came in stating that the pramoxine-hydrocortisone [facility]) 1-1 % rectal cream she was prescribed was $51 and that she needed another prescription that her insurance would cover.. Please advise pt at [phone].   [facility] [address], [place]

## 2016-01-31 NOTE — Patient Instructions (Signed)
Increase ranitidine to twice a day

## 2016-01-31 NOTE — Progress Notes (Signed)
Patient ID: Donna Rhodes, female   DOB: 03-27-1946, 70 y.o.   MRN: [id number]   Subjective:    Patient ID: Donna Rhodes, female    DOB: 1946-11-22, 70 y.o.   MRN: [id number]  HPI  Patient with past history of hypercholesterolemia, GERD, hypothyroidism and hypertension.  She comes in today to follow up on these issues.  She is having problems with "hemorrhoid".  States has noticed something hanging off around her rectum.  Some streaking of blood on the tissue when wipes.  Does have to strain at times.  Has tried prunes.  Discussed adding miralax.  Also reports some intermittent epigastric burning.  No other abdominal pain or cramping.  Increased stress with her daughter's issues.  Does not feel she needs anything more at this point.     Past Medical History  Diagnosis Date  . Hypertension   . Hypothyroidism   . HCV infection   . HSV (herpes simplex virus) infection   . Lung nodule     stable  . Osteoarthritis     hand involvement, cervical disc disease  . Thyroid nodule   . Hypercholesterolemia   . GERD (gastroesophageal reflux disease)   . Vaginal Pap smear, abnormal     lgsil  . Osteoporosis   . Atrophic vaginitis   . Pelvic pain in female   . 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 Menopausal state    Past Surgical History  Procedure Laterality Date  . Appendectomy  1965  . Tubal ligation  1977  . Finger surgery  1982  . Mandible reconstruction  1992  . Bunionectomy  1994    left  . Toenail excision  1998    several  . Vaginal wound closure / repair  1999, 2000  . Cholecystectomy    . Vaginal hysterectomy  1983    als0- anterior/posterior colporrhaphy  . Adenoidectomy    . Laser vein surgery    . Vaginal wound closure / repair     Family History  Problem Relation Age of Onset  . Hypertension Father   . Osteoarthritis Mother   . Breast cancer Sister   . Arthritis/Rheumatoid Sister   . Breast cancer      paternal side  . Breast  cancer Paternal Aunt   . Breast cancer Cousin     maternal  . Uterine cancer Maternal Grandmother   . Ovarian cancer Neg Hx   . Heart disease Neg Hx   . Diabetes Neg Hx   . Colon cancer Neg Hx    Social History   Social History  . Marital Status: Divorced    Spouse Name: N/A  . Number of Children: 3  . Years of Education: N/A   Social History Main Topics  . Smoking status: Former Smoker    Quit date: 12/29/1979  . Smokeless tobacco: Never Used  . Alcohol Use: No  . Drug Use: No  . Sexual Activity: Not Currently    Birth Control/ Protection: Surgical   Other Topics Concern  . None   Social History Narrative    Outpatient Encounter Prescriptions as of 01/31/2016  Medication Sig  . acyclovir (ZOVIRAX) 200 MG capsule TAKE 1 CAPSULE TWICE DAILY  . amLODipine (NORVASC) 5 MG tablet TAKE 1 TABLET EVERY DAY  . calcium-vitamin D (OSCAL WITH D) 250-125 MG-UNIT tablet Take 1 tablet by mouth daily.  Marland Kitchen estradiol (ESTRACE VAGINAL) 0.1 MG/GM vaginal cream Place 1  Applicatorful vaginally at bedtime.  Marland Kitchen estradiol (ESTRACE) 1 MG tablet Take 0.5 tablets (0.5 mg total) by mouth daily.  . folic acid (FOLVITE) 1 MG tablet Take 1 mg by mouth daily.  Marland Kitchen levothyroxine (SYNTHROID, LEVOTHROID) 75 MCG tablet TAKE 1 TABLET ONE TIME DAILY  . methotrexate (RHEUMATREX) 2.5 MG tablet Take 15 mg by mouth once a week. Caution:Chemotherapy. Protect from light.  . predniSONE (DELTASONE) 5 MG tablet Take 5 mg by mouth 2 (two) times daily with a meal.  . ranitidine (ZANTAC) 150 MG tablet Take 1 tablet (150 mg total) by mouth 2 (two) times daily.  . [DISCONTINUED] ranitidine (ZANTAC) 150 MG tablet Take 1 tablet (150 mg total) by mouth daily as needed.  . pramoxine-hydrocortisone (ANALPRAM-HC) 1-1 % rectal cream Place 1 application rectally 2 (two) times daily.   No facility-administered encounter medications on file as of 01/31/2016.    Review of Systems  Constitutional: Negative for appetite change and  unexpected weight change.  HENT: Negative for congestion and sinus pressure.   Respiratory: Negative for cough, chest tightness and shortness of breath.   Cardiovascular: Negative for chest pain, palpitations and leg swelling.  Gastrointestinal: Negative for nausea, vomiting, abdominal pain and diarrhea.  Genitourinary: Negative for dysuria and difficulty urinating.  Musculoskeletal:       Joint pain is better.  Shoulder and arm pain is better.  On MTX.  Taking prednisone.    Skin: Negative for color change and rash.  Neurological: Negative for dizziness, light-headedness and headaches.  Psychiatric/Behavioral: Negative for dysphoric mood and agitation.       Increased stress as outlined.         Objective:    Physical Exam  Constitutional: She appears well-developed and well-nourished. No distress.  HENT:  Nose: Nose normal.  Mouth/Throat: Oropharynx is clear and moist.  Neck: Neck supple. No thyromegaly present.  Cardiovascular: Normal rate and regular rhythm.   Pulmonary/Chest: Breath sounds normal. No respiratory distress. She has no wheezes.  Abdominal: Soft. Bowel sounds are normal. There is no tenderness.  Genitourinary:  Rectal exam - skin tag - external.  No evidence of thrombosed hemorrhoid.    Musculoskeletal: She exhibits no edema or tenderness.  Lymphadenopathy:    She has no cervical adenopathy.  Skin: No rash noted. No erythema.  Psychiatric: She has a normal mood and affect. Her behavior is normal.    BP 128/70 mmHg  Pulse 74  Temp(Src) 98 F (36.7 C) (Oral)  Resp 18  Ht 5' 3.5" (1.613 m)  Wt 126 lb 2 oz (57.21 kg)  BMI 21.99 kg/m2  SpO2 94%  LMP 12/29/1981 Wt Readings from Last 3 Encounters:  01/31/16 126 lb 2 oz (57.21 kg)  01/13/16 125 lb 4 oz (56.813 kg)  12/03/15 127 lb (57.607 kg)     Lab Results  Component Value Date   WBC 5.3 10/18/2015   HGB 12.3 10/18/2015   HCT 37.7 10/18/2015   PLT 276.0 10/18/2015   GLUCOSE 91 10/18/2015   CHOL  179 10/18/2015   TRIG 78.0 10/18/2015   HDL 57.00 10/18/2015   LDLDIRECT 128.0 05/25/2013   LDLCALC 107* 10/18/2015   ALT 18 10/18/2015   AST 22 10/18/2015   NA 141 10/18/2015   K 4.3 10/18/2015   CL 109 10/18/2015   CREATININE 0.83 10/18/2015   BUN 16 10/18/2015   CO2 26 10/18/2015   TSH 1.94 06/14/2015    Mr Knee Right Wo Contrast  12/03/2015  CLINICAL DATA:  Twisting  injury 1 month ago. Persistent medial knee pain. EXAM: MRI OF THE RIGHT KNEE WITHOUT CONTRAST TECHNIQUE: Multiplanar, multisequence MR imaging of the knee was performed. No intravenous contrast was administered. COMPARISON:  None. FINDINGS: MENISCI Medial meniscus: Intrasubstance degenerative type signal changes but no discrete meniscal tear. Lateral meniscus: Moderate intrasubstance degenerative type signal changes but no discrete tear. LIGAMENTS Cruciates:  Intact.  Mild mucoid degeneration of the ACL. Collaterals:  Intact. CARTILAGE Patellofemoral:  Mild degenerative chondrosis/ chondromalacia. Medial:  Mild degenerative chondrosis/ chondromalacia for age. Lateral:  Mild degenerative chondrosis/ chondromalacia for age. Joint: There is a moderate-sized joint effusion and moderate synovitis. Popliteal Fossa:  Small to moderate-sized leaking Baker's cyst. Extensor Mechanism: The patella retinacular structures are intact and the quadriceps and patellar tendons are intact. Bones:  No acute bony findings. IMPRESSION: 1. Minimal degenerative changes for age. 2. Intact ligamentous structures and no acute bony findings. 3. Meniscal degenerative changes without discrete tear. 4. Moderate-sized joint effusion and moderate synovitis. There is also a small to moderate-sized leaking Baker's cyst. Electronically Signed   By: Marijo Sanes M.D.   On: 12/03/2015 16:31       Assessment & Plan:   Problem List Items Addressed This Visit    Anal skin tag    Rectal exam revealed skin tab - external.  Some irritation.  Will try analpram.  Call  with update.  If persistent, will have surgery evaluate.        Depression    Increased stress as outlined.  Discussed with her today.  She desires no further intervention.  Follow.       Essential hypertension    Blood pressure under good control.  Continue same medication regimen.  Follow pressures.  Follow metabolic panel.        GERD (gastroesophageal reflux disease) - Primary    Some increased epigastric acid at times.  Increase zantac to bid.  Get her back in soon to reassess.  Intermittent problem.       Relevant Medications   ranitidine (ZANTAC) 150 MG tablet   Hypercholesterolemia    Low cholesterol diet and exercise.  Follow lipid panel.        Hypothyroidism    On thyroid replacement.  Follow tsh.       Inflammatory arthritis (HCC)    On MTX.  Discussed with her today.  Taking prednisone.  Joints better.  Pain better.            Einar Pheasant, MD

## 2016-01-31 NOTE — Telephone Encounter (Signed)
Spoke with the pt about the script for the anal cream, I stated that I called the pharmacy states that here is no other cost effective alternative for her to get filled. If she is paying out of pocket the pharmacist could do regular hydrocortisone for 43 dollars.

## 2016-01-31 NOTE — Progress Notes (Signed)
Pre-visit discussion using our clinic review tool. No additional management support is needed unless otherwise documented below in the visit note.  

## 2016-02-02 ENCOUNTER — Encounter: Payer: Self-pay | Admitting: Internal Medicine

## 2016-02-02 DIAGNOSIS — K644 Residual hemorrhoidal skin tags: Secondary | ICD-10-CM | POA: Insufficient documentation

## 2016-02-02 NOTE — Assessment & Plan Note (Signed)
Increased stress as outlined.  Discussed with her today. She desires no further intervention.  Follow.   

## 2016-02-02 NOTE — Assessment & Plan Note (Signed)
Rectal exam revealed skin tab - external.  Some irritation.  Will try analpram.  Call with update.  If persistent, will have surgery evaluate.

## 2016-02-02 NOTE — Assessment & Plan Note (Signed)
Blood pressure under good control.  Continue same medication regimen.  Follow pressures.  Follow metabolic panel.   

## 2016-02-02 NOTE — Assessment & Plan Note (Signed)
On thyroid replacement.  Follow tsh.  

## 2016-02-02 NOTE — Assessment & Plan Note (Signed)
Low cholesterol diet and exercise.  Follow lipid panel.   

## 2016-02-02 NOTE — Assessment & Plan Note (Signed)
On MTX.  Discussed with her today.  Taking prednisone.  Joints better.  Pain better.

## 2016-02-03 DIAGNOSIS — M1990 Unspecified osteoarthritis, unspecified site: Secondary | ICD-10-CM | POA: Diagnosis not present

## 2016-02-03 DIAGNOSIS — M150 Primary generalized (osteo)arthritis: Secondary | ICD-10-CM | POA: Diagnosis not present

## 2016-02-03 DIAGNOSIS — M0609 Rheumatoid arthritis without rheumatoid factor, multiple sites: Secondary | ICD-10-CM | POA: Diagnosis not present

## 2016-02-03 DIAGNOSIS — Z79899 Other long term (current) drug therapy: Secondary | ICD-10-CM | POA: Diagnosis not present

## 2016-02-25 ENCOUNTER — Telehealth: Payer: Self-pay | Admitting: *Deleted

## 2016-02-25 DIAGNOSIS — K649 Unspecified hemorrhoids: Secondary | ICD-10-CM

## 2016-02-25 NOTE — Telephone Encounter (Signed)
Pt states that she is no better then she was when came in for last OV. Pt states the medication that she was given did not help at all. She does not have preference on who does her surgery. Please advise, thanks

## 2016-02-25 NOTE — Telephone Encounter (Signed)
Pt is requesting referral for her hemrroids. Please advise, thanks

## 2016-02-25 NOTE — Telephone Encounter (Signed)
Patient was advise to call the office to get a referral By Dr.Scott. She stated that she has hemrroids and she was to see a specialist for this .  Please advise  Pt Contact [phone]

## 2016-02-25 NOTE — Telephone Encounter (Signed)
Per last note, she was going to try medication and call with update.  If persistent problem, would refer to surgery.  Does she have a preference of surgeon she would like to see?

## 2016-02-26 NOTE — Telephone Encounter (Signed)
Order placed for surgery referral.  See notes.

## 2016-03-20 ENCOUNTER — Ambulatory Visit: Payer: PPO | Admitting: Internal Medicine

## 2016-03-26 DIAGNOSIS — Z79899 Other long term (current) drug therapy: Secondary | ICD-10-CM | POA: Diagnosis not present

## 2016-03-26 DIAGNOSIS — M0609 Rheumatoid arthritis without rheumatoid factor, multiple sites: Secondary | ICD-10-CM | POA: Diagnosis not present

## 2016-04-02 DIAGNOSIS — M0609 Rheumatoid arthritis without rheumatoid factor, multiple sites: Secondary | ICD-10-CM | POA: Diagnosis not present

## 2016-04-02 DIAGNOSIS — Z79899 Other long term (current) drug therapy: Secondary | ICD-10-CM | POA: Diagnosis not present

## 2016-04-02 DIAGNOSIS — M150 Primary generalized (osteo)arthritis: Secondary | ICD-10-CM | POA: Diagnosis not present

## 2016-04-02 DIAGNOSIS — K210 Gastro-esophageal reflux disease with esophagitis: Secondary | ICD-10-CM | POA: Diagnosis not present

## 2016-04-10 ENCOUNTER — Ambulatory Visit: Payer: PPO | Admitting: Internal Medicine

## 2016-05-05 ENCOUNTER — Ambulatory Visit: Payer: PPO | Admitting: Internal Medicine

## 2016-05-08 ENCOUNTER — Other Ambulatory Visit: Payer: Self-pay | Admitting: Internal Medicine

## 2016-05-08 DIAGNOSIS — Z1231 Encounter for screening mammogram for malignant neoplasm of breast: Secondary | ICD-10-CM

## 2016-05-13 ENCOUNTER — Ambulatory Visit: Payer: PPO | Admitting: Internal Medicine

## 2016-05-19 ENCOUNTER — Ambulatory Visit: Payer: Medicare PPO | Admitting: Obstetrics and Gynecology

## 2016-05-22 ENCOUNTER — Other Ambulatory Visit: Payer: Self-pay

## 2016-05-22 DIAGNOSIS — M1990 Unspecified osteoarthritis, unspecified site: Secondary | ICD-10-CM

## 2016-05-22 MED ORDER — PREDNISONE 10 MG (21) PO TBPK: 10.0000 mg | ORAL_TABLET | Freq: Every day | ORAL | Status: DC

## 2016-06-04 ENCOUNTER — Encounter: Payer: Self-pay | Admitting: Obstetrics and Gynecology

## 2016-06-04 ENCOUNTER — Ambulatory Visit (INDEPENDENT_AMBULATORY_CARE_PROVIDER_SITE_OTHER): Payer: PPO | Admitting: Obstetrics and Gynecology

## 2016-06-04 DIAGNOSIS — Z90710 Acquired absence of both cervix and uterus: Secondary | ICD-10-CM

## 2016-06-04 DIAGNOSIS — R87811 Vaginal high risk human papillomavirus (HPV) DNA test positive: Secondary | ICD-10-CM | POA: Diagnosis not present

## 2016-06-04 DIAGNOSIS — R87622 Low grade squamous intraepithelial lesion on cytologic smear of vagina (LGSIL): Secondary | ICD-10-CM | POA: Diagnosis not present

## 2016-06-04 MED ORDER — ACYCLOVIR 200 MG PO CAPS
200.0000 mg | ORAL_CAPSULE | Freq: Two times a day (BID) | ORAL | Status: DC
Start: 2016-06-04 — End: 2017-04-27

## 2016-06-04 NOTE — Progress Notes (Signed)
Chief complaint: 1.  History of vaginal dysplasia. 2.  Symptomatic Menopause.  Patient presents for 6 month interval Pap smear. 11/14/2015-Colposcopic directed biopsy - Benign vaginal tissue 10/16/2015.  Pap smear-LGSIL  OBJECTIVE: BP 146/70 mmHg  Pulse 66  Ht 5\' 4"  (1.626 m)  Wt 125 lb 12.8 oz (57.063 kg)  BMI 21.58 kg/m2  LMP 12/29/1981 Pelvic exam: External genitalia-normal BUS-normal. Vagina-atrophic changes; no lesions. Cervix-surgically absent.  ASSESSMENT: 1.   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  Symptomatic menopause, on ERT.  PLAN: 1.  Continue estradiol 1 mg a day and Estrace cream, intravaginal twice a week. 2.  Pap smear is completed. 3.  Return in 6 months for repeat Pap smear testing  Brayton Mars, MD  Note: This dictation was prepared with Dragon dictation along with smaller phrase technology. Any transcriptional errors that result from this process are unintentional.

## 2016-06-04 NOTE — Patient Instructions (Signed)
1. Pap smear is done 2. Return in 6 months for repeat Pap

## 2016-06-08 LAB — PAP IG W/ RFLX HPV ASCU: PAP SMEAR COMMENT: 0

## 2016-06-09 MED ORDER — ESTRADIOL 1 MG PO TABS: 0.5000 mg | ORAL_TABLET | Freq: Every day | ORAL | Status: DC

## 2016-06-09 NOTE — Addendum Note (Signed)
Addended by: Elouise Munroe on: 06/09/2016 12:16 PM   Modules accepted: Orders

## 2016-06-10 ENCOUNTER — Other Ambulatory Visit: Payer: Self-pay | Admitting: Internal Medicine

## 2016-06-10 ENCOUNTER — Ambulatory Visit
Admission: RE | Admit: 2016-06-10 | Discharge: 2016-06-10 | Disposition: A | Discharge status: Home / Self Care | Payer: PPO | Source: Ambulatory Visit | Attending: Internal Medicine | Admitting: Internal Medicine

## 2016-06-10 DIAGNOSIS — Z1231 Encounter for screening mammogram for malignant neoplasm of breast: Secondary | ICD-10-CM | POA: Insufficient documentation

## 2016-06-15 ENCOUNTER — Other Ambulatory Visit: Payer: Self-pay

## 2016-06-15 ENCOUNTER — Telehealth: Payer: Self-pay | Admitting: *Deleted

## 2016-06-15 MED ORDER — LEVOTHYROXINE SODIUM 75 MCG PO TABS: ORAL_TABLET | ORAL | Status: DC

## 2016-06-15 MED ORDER — AMLODIPINE BESYLATE 5 MG PO TABS: 5.0000 mg | ORAL_TABLET | Freq: Every day | ORAL | Status: DC

## 2016-06-15 NOTE — Telephone Encounter (Signed)
Patient has requested a medication refill for amlodipine and levothyroxine,patient now uses a [facility] on [address].

## 2016-06-15 NOTE — Telephone Encounter (Signed)
Medications have been refilled ?

## 2016-06-29 DIAGNOSIS — Z79899 Other long term (current) drug therapy: Secondary | ICD-10-CM | POA: Diagnosis not present

## 2016-06-29 DIAGNOSIS — M0609 Rheumatoid arthritis without rheumatoid factor, multiple sites: Secondary | ICD-10-CM | POA: Diagnosis not present

## 2016-07-07 DIAGNOSIS — M150 Primary generalized (osteo)arthritis: Secondary | ICD-10-CM | POA: Diagnosis not present

## 2016-07-07 DIAGNOSIS — M0609 Rheumatoid arthritis without rheumatoid factor, multiple sites: Secondary | ICD-10-CM | POA: Diagnosis not present

## 2016-07-21 ENCOUNTER — Ambulatory Visit (INDEPENDENT_AMBULATORY_CARE_PROVIDER_SITE_OTHER): Payer: PPO | Admitting: Internal Medicine

## 2016-07-21 ENCOUNTER — Encounter: Payer: Self-pay | Admitting: Internal Medicine

## 2016-07-21 DIAGNOSIS — F32A Depression, unspecified: Secondary | ICD-10-CM

## 2016-07-21 DIAGNOSIS — F329 Major depressive disorder, single episode, unspecified: Secondary | ICD-10-CM

## 2016-07-21 DIAGNOSIS — R87612 Low grade squamous intraepithelial lesion on cytologic smear of cervix (LGSIL): Secondary | ICD-10-CM

## 2016-07-21 DIAGNOSIS — K219 Gastro-esophageal reflux disease without esophagitis: Secondary | ICD-10-CM

## 2016-07-21 DIAGNOSIS — M1990 Unspecified osteoarthritis, unspecified site: Secondary | ICD-10-CM

## 2016-07-21 DIAGNOSIS — E039 Hypothyroidism, unspecified: Secondary | ICD-10-CM

## 2016-07-21 DIAGNOSIS — I10 Essential (primary) hypertension: Secondary | ICD-10-CM | POA: Diagnosis not present

## 2016-07-21 DIAGNOSIS — E7800 Pure hypercholesterolemia, unspecified: Secondary | ICD-10-CM

## 2016-07-21 NOTE — Progress Notes (Signed)
Patient ID: Donna Rhodes, female   DOB: 12-08-1946, 70 y.o.   MRN: [id number]   Subjective:    Patient ID: Donna Rhodes, female    DOB: 1946-02-24, 70 y.o.   MRN: [id number]  HPI  Patient here for a scheduled follow up.  She has been seeing Dr Jefm Bryant for her arthritis.  She is off prednisone.  Stopped MTX.  States did not want to take.  Was concerned about side effects.  See Dr Scharlene Gloss notes.  With hand stiffness.  Stays active.  No chest pain.  No sob.  No abdominal pain or cramping.  Bowels stable.  Blood pressure doing well.  Seeing gyn for abnormal pap.  See notes.  Increased stress with her daughter.  Discussed at length with her today.  Does not feel needs anything more at this time.     Past Medical History:  Diagnosis Date  . Atrophic vaginitis   . Dysplasia of vagina    vin 1 - vulva and vaginal cuff  . GERD (gastroesophageal reflux disease)   . HCV infection   . HSV (herpes simplex virus) infection   . Hypercholesterolemia   . Hypertension   . Hypothyroidism   . Lung nodule    stable  . Menopausal state   . Osteoarthritis    hand involvement, cervical disc disease  . Osteoporosis   . Pelvic pain in female   . Thyroid nodule   . Vaginal Pap smear, abnormal    lgsil   Past Surgical History:  Procedure Laterality Date  . ADENOIDECTOMY    . APPENDECTOMY  1965  . [place]   left  . CHOLECYSTECTOMY    . [place]  . laser vein surgery    . MANDIBLE RECONSTRUCTION  1992  . TOENAIL EXCISION  1998   several  . [place]  . VAGINAL HYSTERECTOMY  1983   als0- anterior/posterior colporrhaphy  . VAGINAL WOUND CLOSURE / REPAIR  1999, 2000  . VAGINAL WOUND CLOSURE / REPAIR     Family History  Problem Relation Age of Onset  . Hypertension Father   . Osteoarthritis Mother   . Arthritis/Rheumatoid Sister   . Breast cancer      paternal side  . Breast cancer Paternal Aunt   . Breast cancer Cousin     maternal  . Uterine  cancer Maternal Grandmother   . Ovarian cancer Neg Hx   . Heart disease Neg Hx   . Diabetes Neg Hx   . Colon cancer Neg Hx    Social History   Social History  . Marital status: Divorced    Spouse name: N/A  . Number of children: 3  . Years of education: N/A   Social History Main Topics  . Smoking status: Former Smoker    Quit date: 12/29/1979  . Smokeless tobacco: Never Used  . Alcohol use No  . Drug use: No  . Sexual activity: Not Currently    Birth control/ protection: Surgical   Other Topics Concern  . None   Social History Narrative  . None    Outpatient Encounter Prescriptions as of 07/21/2016  Medication Sig  . acyclovir (ZOVIRAX) 200 MG capsule Take 1 capsule (200 mg total) by mouth 2 (two) times daily.  Marland Kitchen amLODipine (NORVASC) 5 MG tablet Take 1 tablet (5 mg total) by mouth daily.  . calcium-vitamin D (OSCAL WITH D) 250-125 MG-UNIT tablet Take 1 tablet by mouth daily.  Marland Kitchen  estradiol (ESTRACE VAGINAL) 0.1 MG/GM vaginal cream Place 1 Applicatorful vaginally at bedtime.  Marland Kitchen estradiol (ESTRACE) 1 MG tablet Take 0.5 tablets (0.5 mg total) by mouth daily.  . folic acid (FOLVITE) 1 MG tablet Take 1 mg by mouth daily.  Marland Kitchen levothyroxine (SYNTHROID, LEVOTHROID) 75 MCG tablet TAKE 1 TABLET ONE TIME DAILY  . [DISCONTINUED] methotrexate (RHEUMATREX) 2.5 MG tablet Take 15 mg by mouth once a week. Caution:Chemotherapy. Protect from light.   No facility-administered encounter medications on file as of 07/21/2016.     Review of Systems  Constitutional: Negative for appetite change and unexpected weight change.  HENT: Negative for congestion and sinus pressure.   Respiratory: Negative for cough, chest tightness and shortness of breath.   Cardiovascular: Negative for chest pain, palpitations and leg swelling.  Gastrointestinal: Negative for abdominal pain, diarrhea and nausea.  Genitourinary: Negative for difficulty urinating and dysuria.  Musculoskeletal: Negative for myalgias.        Hand stiffness as outlined.    Skin: Negative for color change and rash.  Neurological: Negative for dizziness, light-headedness and headaches.  Psychiatric/Behavioral: Negative for dysphoric mood.       Increased stress as outlined.         Objective:     Blood pressure rechecked by me:  134/78  Physical Exam  Constitutional: She appears well-developed and well-nourished. No distress.  HENT:  Nose: Nose normal.  Mouth/Throat: Oropharynx is clear and moist.  Neck: Neck supple. No thyromegaly present.  Cardiovascular: Normal rate and regular rhythm.   Pulmonary/Chest: Breath sounds normal. No respiratory distress. She has no wheezes.  Abdominal: Soft. Bowel sounds are normal. There is no tenderness.  Musculoskeletal: She exhibits no edema or tenderness.  Changes in fingers c/w arthritis.   Lymphadenopathy:    She has no cervical adenopathy.  Skin: No rash noted. No erythema.  Psychiatric: She has a normal mood and affect. Her behavior is normal.    BP 120/78   Pulse 60   Wt 123 lb 12.8 oz (56.2 kg)   LMP 12/29/1981   SpO2 97%   BMI 21.25 kg/m  Wt Readings from Last 3 Encounters:  07/21/16 123 lb 12.8 oz (56.2 kg)  06/04/16 125 lb 12.8 oz (57.1 kg)  01/31/16 126 lb 2 oz (57.2 kg)     Lab Results  Component Value Date   WBC 5.3 10/18/2015   HGB 12.3 10/18/2015   HCT 37.7 10/18/2015   PLT 276.0 10/18/2015   GLUCOSE 91 10/18/2015   CHOL 179 10/18/2015   TRIG 78.0 10/18/2015   HDL 57.00 10/18/2015   LDLDIRECT 128.0 05/25/2013   LDLCALC 107 (H) 10/18/2015   ALT 18 10/18/2015   AST 22 10/18/2015   NA 141 10/18/2015   K 4.3 10/18/2015   CL 109 10/18/2015   CREATININE 0.83 10/18/2015   BUN 16 10/18/2015   CO2 26 10/18/2015   TSH 1.94 06/14/2015    Mm Screening Breast Tomo Bilateral  Result Date: 06/11/2016 CLINICAL DATA:  Screening. EXAM: 2D DIGITAL SCREENING BILATERAL MAMMOGRAM WITH CAD AND ADJUNCT TOMO COMPARISON:  Previous exam(s). ACR Breast Density  Category b: There are scattered areas of fibroglandular density. FINDINGS: There are no findings suspicious for malignancy. Images were processed with CAD. IMPRESSION: No mammographic evidence of malignancy. A result letter of this screening mammogram will be mailed directly to the patient. RECOMMENDATION: Screening mammogram in one year. (Code:SM-B-01Y) BI-RADS CATEGORY  1: Negative. Electronically Signed   By: Pamelia Hoit M.D.   On:  06/11/2016 08:14       Assessment & Plan:   Problem List Items Addressed This Visit    Abnormal Pap smear of cervix    Followed by Dr Enzo Bi.  Vaginal dysplasia.  Has f/u in 6 months after last visit.        Depression    Increased stress as outlined.  Discussed with her today.  Desires no further intervention at this time.  Follow.        Essential hypertension    Blood pressure under good control.  Continue same medication regimen.  Follow pressures.  Follow metabolic panel.        Relevant Orders   Comprehensive metabolic panel   GERD (gastroesophageal reflux disease)    No acid reflux reported.  Follow.       Hypercholesterolemia    Follow lipid panel.        Relevant Orders   Lipid panel   Hypothyroidism    On thyroid medication.  Follow tsh.       Relevant Orders   TSH   Inflammatory arthritis (HCC)    Sees Dr Jefm Bryant.  Off prednisone and MTX currently.  Continue f/u with rheumatology.         Other Visit Diagnoses   None.      Einar Pheasant, MD

## 2016-07-25 ENCOUNTER — Encounter: Payer: Self-pay | Admitting: Internal Medicine

## 2016-07-25 NOTE — Assessment & Plan Note (Signed)
Blood pressure under good control.  Continue same medication regimen.  Follow pressures.  Follow metabolic panel.   

## 2016-07-25 NOTE — Assessment & Plan Note (Signed)
Sees Dr Jefm Bryant.  Off prednisone and MTX currently.  Continue f/u with rheumatology.

## 2016-07-25 NOTE — Assessment & Plan Note (Signed)
Followed by Dr Enzo Bi.  Vaginal dysplasia.  Has f/u in 6 months after last visit.

## 2016-07-25 NOTE — Assessment & Plan Note (Signed)
Follow lipid panel.   

## 2016-07-25 NOTE — Assessment & Plan Note (Signed)
No acid reflux reported.  Follow.    

## 2016-07-25 NOTE — Assessment & Plan Note (Signed)
On thyroid medication.  Follow tsh.  

## 2016-07-25 NOTE — Assessment & Plan Note (Signed)
Increased stress as outlined.  Discussed with her today.  Desires no further intervention at this time.  Follow.

## 2016-08-05 ENCOUNTER — Other Ambulatory Visit (INDEPENDENT_AMBULATORY_CARE_PROVIDER_SITE_OTHER): Payer: PPO

## 2016-08-05 DIAGNOSIS — E7800 Pure hypercholesterolemia, unspecified: Secondary | ICD-10-CM | POA: Diagnosis not present

## 2016-08-05 DIAGNOSIS — I10 Essential (primary) hypertension: Secondary | ICD-10-CM

## 2016-08-05 DIAGNOSIS — E039 Hypothyroidism, unspecified: Secondary | ICD-10-CM

## 2016-08-05 LAB — COMPREHENSIVE METABOLIC PANEL
ALT: 14 U/L (ref 0–35)
AST: 18 U/L (ref 0–37)
Albumin: 4 g/dL (ref 3.5–5.2)
Alkaline Phosphatase: 93 U/L (ref 39–117)
BILIRUBIN TOTAL: 0.4 mg/dL (ref 0.2–1.2)
BUN: 13 mg/dL (ref 6–23)
CALCIUM: 9.4 mg/dL (ref 8.4–10.5)
CHLORIDE: 107 meq/L (ref 96–112)
CO2: 29 meq/L (ref 19–32)
Creatinine, Ser: 0.79 mg/dL (ref 0.40–1.20)
GFR: 76.31 mL/min (ref >60.00)
GLUCOSE: 91 mg/dL (ref 70–99)
Potassium: 4.2 mEq/L (ref 3.5–5.1)
Sodium: 141 mEq/L (ref 135–145)
Total Protein: 6.9 g/dL (ref 6.0–8.3)

## 2016-08-05 LAB — LIPID PANEL
CHOL/HDL RATIO: 3
Cholesterol: 203 mg/dL — ABNORMAL HIGH (ref 0–200)
HDL: 62.6 mg/dL (ref >39.00)
LDL CALC: 125 mg/dL — AB (ref 0–99)
NONHDL: 140.5
TRIGLYCERIDES: 76 mg/dL (ref 0.0–149.0)
VLDL: 15.2 mg/dL (ref 0.0–40.0)

## 2016-08-05 LAB — TSH: TSH: 18.89 u[IU]/mL — AB (ref 0.35–4.50)

## 2016-08-07 ENCOUNTER — Telehealth: Payer: Self-pay | Admitting: *Deleted

## 2016-08-07 NOTE — Telephone Encounter (Signed)
Patient was informed of results.  Patient understood and no questions, comments, or concerns at this time.  

## 2016-08-07 NOTE — Telephone Encounter (Signed)
Patient called and is inquiring about her lab results. Patient is requesting a call back . Her number is [phone] or [phone]. Thanks

## 2016-08-24 ENCOUNTER — Telehealth: Payer: Self-pay | Admitting: Internal Medicine

## 2016-08-24 ENCOUNTER — Other Ambulatory Visit: Payer: Self-pay | Admitting: Internal Medicine

## 2016-08-24 DIAGNOSIS — E039 Hypothyroidism, unspecified: Secondary | ICD-10-CM

## 2016-08-24 NOTE — Telephone Encounter (Signed)
Spoke with patient and scheduled for repeat TSH in Sept. thanks

## 2016-08-24 NOTE — Telephone Encounter (Signed)
Ms. Forbess called saying she received a phone call Friday and she's not sure who it was from or why. She remembers speaking to Amberg about her recent lab results and was told her thyroid levels were elevated. She's not been taking her medication as she should and she said she'd begin doing so so she doesn't have to increase the medication to 100mg . She's not sure if she was called for this or not.   In addition, she's wondering if she needs to have her labs a few days before her CPE in October.  Per the pt's request please call her BEFORE LUNCH TODAY.  Pt's ph# [phone] Thank you.

## 2016-08-24 NOTE — Telephone Encounter (Signed)
If she is not increasing her medication, then I want her to have her repeat tsh in 4 weeks.  Too early for cholesterol etc.  Needs non fasting lab appt in 4 weeks.

## 2016-08-24 NOTE — Telephone Encounter (Signed)
Spoke with the patient, informed her that the only call was regarding the lab results on Friday.  After speaking with Fransisco Beau she has decided not to increase the synthroid as she was not complaint with it.  She started Friday taking the 32mcg daily , is she okay to do that and have the repeat labs or do you still want her to increase to the 161mcg? Per hte note you want repeat labs in 4-6 weeks, is there any additional that she needs prior to her CPE in October?   Thanks

## 2016-09-15 ENCOUNTER — Other Ambulatory Visit (INDEPENDENT_AMBULATORY_CARE_PROVIDER_SITE_OTHER): Payer: PPO

## 2016-09-15 DIAGNOSIS — E039 Hypothyroidism, unspecified: Secondary | ICD-10-CM | POA: Diagnosis not present

## 2016-09-15 LAB — CBC WITH DIFFERENTIAL/PLATELET
BASOS ABS: 0 10*3/uL (ref 0.0–0.1)
Basophils Relative: 1 % (ref 0.0–3.0)
EOS ABS: 0.2 10*3/uL (ref 0.0–0.7)
Eosinophils Relative: 5.2 % — ABNORMAL HIGH (ref 0.0–5.0)
HCT: 36.5 % (ref 36.0–46.0)
HEMOGLOBIN: 12.2 g/dL (ref 12.0–15.0)
LYMPHS PCT: 32 % (ref 12.0–46.0)
Lymphs Abs: 1.3 10*3/uL (ref 0.7–4.0)
MCHC: 33.4 g/dL (ref 30.0–36.0)
MCV: 90.1 fl (ref 78.0–100.0)
MONO ABS: 0.4 10*3/uL (ref 0.1–1.0)
Monocytes Relative: 9.4 % (ref 3.0–12.0)
NEUTROS ABS: 2.1 10*3/uL (ref 1.4–7.7)
Neutrophils Relative %: 52.4 % (ref 43.0–77.0)
PLATELETS: 254 10*3/uL (ref 150.0–400.0)
RBC: 4.05 Mil/uL (ref 3.87–5.11)
RDW: 13.9 % (ref 11.5–15.5)
WBC: 4 10*3/uL (ref 4.0–10.5)

## 2016-09-15 LAB — TSH: TSH: 1.08 u[IU]/mL (ref 0.35–4.50)

## 2016-09-30 DIAGNOSIS — Z79899 Other long term (current) drug therapy: Secondary | ICD-10-CM | POA: Diagnosis not present

## 2016-10-05 DIAGNOSIS — M150 Primary generalized (osteo)arthritis: Secondary | ICD-10-CM | POA: Diagnosis not present

## 2016-10-05 DIAGNOSIS — M791 Myalgia: Secondary | ICD-10-CM | POA: Diagnosis not present

## 2016-10-05 DIAGNOSIS — K210 Gastro-esophageal reflux disease with esophagitis: Secondary | ICD-10-CM | POA: Diagnosis not present

## 2016-10-05 DIAGNOSIS — M0609 Rheumatoid arthritis without rheumatoid factor, multiple sites: Secondary | ICD-10-CM | POA: Diagnosis not present

## 2016-10-23 ENCOUNTER — Encounter: Payer: Self-pay | Admitting: Internal Medicine

## 2016-10-23 ENCOUNTER — Ambulatory Visit (INDEPENDENT_AMBULATORY_CARE_PROVIDER_SITE_OTHER): Payer: PPO | Admitting: Internal Medicine

## 2016-10-23 VITALS — BP 120/76 | HR 76 | Temp 98.3°F | Ht 63.0 in | Wt 125.2 lb

## 2016-10-23 DIAGNOSIS — F329 Major depressive disorder, single episode, unspecified: Secondary | ICD-10-CM

## 2016-10-23 DIAGNOSIS — E7800 Pure hypercholesterolemia, unspecified: Secondary | ICD-10-CM

## 2016-10-23 DIAGNOSIS — M19041 Primary osteoarthritis, right hand: Secondary | ICD-10-CM

## 2016-10-23 DIAGNOSIS — F32A Depression, unspecified: Secondary | ICD-10-CM

## 2016-10-23 DIAGNOSIS — Z23 Encounter for immunization: Secondary | ICD-10-CM | POA: Diagnosis not present

## 2016-10-23 DIAGNOSIS — M19042 Primary osteoarthritis, left hand: Secondary | ICD-10-CM

## 2016-10-23 DIAGNOSIS — E039 Hypothyroidism, unspecified: Secondary | ICD-10-CM | POA: Diagnosis not present

## 2016-10-23 DIAGNOSIS — R1013 Epigastric pain: Secondary | ICD-10-CM | POA: Diagnosis not present

## 2016-10-23 DIAGNOSIS — I10 Essential (primary) hypertension: Secondary | ICD-10-CM

## 2016-10-23 DIAGNOSIS — M1990 Unspecified osteoarthritis, unspecified site: Secondary | ICD-10-CM

## 2016-10-23 DIAGNOSIS — R87612 Low grade squamous intraepithelial lesion on cytologic smear of cervix (LGSIL): Secondary | ICD-10-CM

## 2016-10-23 DIAGNOSIS — K219 Gastro-esophageal reflux disease without esophagitis: Secondary | ICD-10-CM

## 2016-10-23 DIAGNOSIS — Z0000 Encounter for general adult medical examination without abnormal findings: Secondary | ICD-10-CM

## 2016-10-23 LAB — LIPASE: Lipase: 22 U/L (ref 11.0–59.0)

## 2016-10-23 LAB — AMYLASE: Amylase: 44 U/L (ref 27–131)

## 2016-10-23 MED ORDER — ESOMEPRAZOLE MAGNESIUM 40 MG PO CPDR: 40.0000 mg | DELAYED_RELEASE_CAPSULE | Freq: Every day | ORAL | 2 refills | Status: DC

## 2016-10-23 NOTE — Progress Notes (Signed)
Patient ID: Donna Rhodes, female   DOB: [birth year], 70 y.o.   MRN: [id number]   Subjective:    Patient ID: Donna Rhodes, female    DOB: [birth year], 70 y.o.   MRN: [id number]  HPI  Patient with past history of hypercholesterolemia, GERD and hypothyroidism.  She comes in today to follow up on these issues as well as for a complete physical exam.  She gets her pelvic and pap smears through gyn.  She reports some burning/acid reflux.  Not taking PPI regularly now.  Reports bad acid reflux.  No abdominal pain or cramping.  No chest pain.  No sob.  Bowels stable.     Past Medical History:  Diagnosis Date  . Atrophic vaginitis   . Dysplasia of vagina    vin 1 - vulva and vaginal cuff  . GERD (gastroesophageal reflux disease)   . HCV infection   . HSV (herpes simplex virus) infection   . Hypercholesterolemia   . Hypertension   . Hypothyroidism   . Lung nodule    stable  . Menopausal state   . Osteoarthritis    hand involvement, cervical disc disease  . Osteoporosis   . Pelvic pain in female   . Thyroid nodule   . Vaginal Pap smear, abnormal    lgsil   Past Surgical History:  Procedure Laterality Date  . ADENOIDECTOMY    . APPENDECTOMY  1965  . [place]   left  . CHOLECYSTECTOMY    . [place]  . laser vein surgery    . MANDIBLE RECONSTRUCTION  1992  . TOENAIL EXCISION  1998   several  . [place]  . VAGINAL HYSTERECTOMY  1983   als0- anterior/posterior colporrhaphy  . VAGINAL WOUND CLOSURE / REPAIR  1999, 2000  . VAGINAL WOUND CLOSURE / REPAIR     Family History  Problem Relation Age of Onset  . Hypertension Father   . Osteoarthritis Mother   . Arthritis/Rheumatoid Sister   . Breast cancer      paternal side  . Breast cancer Paternal Aunt   . Breast cancer Cousin     maternal  . Uterine cancer Maternal Grandmother   . Ovarian cancer Neg Hx   . Heart disease Neg Hx   . Diabetes Neg Hx   . Colon cancer Neg Hx    Social  History   Social History  . Marital status: Divorced    Spouse name: N/A  . Number of children: 3  . Years of education: N/A   Social History Main Topics  . Smoking status: Former Smoker    Quit date: 12/29/1979  . Smokeless tobacco: Never Used  . Alcohol use No  . Drug use: No  . Sexual activity: Not Currently    Birth control/ protection: Surgical   Other Topics Concern  . None   Social History Narrative  . None    Outpatient Encounter Prescriptions as of 10/23/2016  Medication Sig  . acyclovir (ZOVIRAX) 200 MG capsule Take 1 capsule (200 mg total) by mouth 2 (two) times daily.  Marland Kitchen amLODipine (NORVASC) 5 MG tablet Take 1 tablet (5 mg total) by mouth daily.  . calcium-vitamin D (OSCAL WITH D) 250-125 MG-UNIT tablet Take 1 tablet by mouth daily.  Marland Kitchen estradiol (ESTRACE VAGINAL) 0.1 MG/GM vaginal cream Place 1 Applicatorful vaginally at bedtime.  Marland Kitchen estradiol (ESTRACE) 1 MG tablet Take 0.5 tablets (0.5 mg total) by mouth daily.  . folic  acid (FOLVITE) 1 MG tablet Take 1 mg by mouth daily.  Marland Kitchen levothyroxine (SYNTHROID, LEVOTHROID) 75 MCG tablet TAKE 1 TABLET ONE TIME DAILY  . esomeprazole (NEXIUM) 40 MG capsule Take 1 capsule (40 mg total) by mouth daily.   No facility-administered encounter medications on file as of 10/23/2016.     Review of Systems  Constitutional: Negative for appetite change and unexpected weight change.  HENT: Negative for congestion and sinus pressure.   Eyes: Negative for pain and visual disturbance.  Respiratory: Negative for cough, chest tightness and shortness of breath.   Cardiovascular: Negative for chest pain, palpitations and leg swelling.  Gastrointestinal: Negative for abdominal pain, diarrhea, nausea and vomiting.       Increased acid reflux.    Genitourinary: Negative for difficulty urinating and dysuria.  Musculoskeletal: Positive for joint swelling. Negative for back pain.  Skin: Negative for color change and rash.  Neurological: Negative  for dizziness, light-headedness and headaches.  Hematological: Negative for adenopathy. Does not bruise/bleed easily.  Psychiatric/Behavioral: Negative for agitation and dysphoric mood.       Objective:    Physical Exam  Constitutional: She is oriented to person, place, and time. She appears well-developed and well-nourished. No distress.  HENT:  Nose: Nose normal.  Mouth/Throat: Oropharynx is clear and moist.  Eyes: Right eye exhibits no discharge. Left eye exhibits no discharge. No scleral icterus.  Neck: Neck supple. No thyromegaly present.  Cardiovascular: Normal rate and regular rhythm.   Pulmonary/Chest: Breath sounds normal. No accessory muscle usage. No tachypnea. No respiratory distress. She has no decreased breath sounds. She has no wheezes. She has no rhonchi. Right breast exhibits no inverted nipple, no mass, no nipple discharge and no tenderness (no axillary adenopathy). Left breast exhibits no inverted nipple, no mass, no nipple discharge and no tenderness (no axilarry adenopathy).  Abdominal: Soft. Bowel sounds are normal. There is no tenderness.  Musculoskeletal: She exhibits no edema or tenderness.  Lymphadenopathy:    She has no cervical adenopathy.  Neurological: She is alert and oriented to person, place, and time.  Skin: Skin is warm. No rash noted. No erythema.  Psychiatric: She has a normal mood and affect. Her behavior is normal.    BP 120/76   Pulse 76   Temp 98.3 F (36.8 C) (Oral)   Ht 5\' 3"  (1.6 m)   Wt 125 lb 3.2 oz (56.8 kg)   LMP 12/29/1981   SpO2 96%   BMI 22.18 kg/m  Wt Readings from Last 3 Encounters:  10/23/16 125 lb 3.2 oz (56.8 kg)  07/21/16 123 lb 12.8 oz (56.2 kg)  06/04/16 125 lb 12.8 oz (57.1 kg)     Lab Results  Component Value Date   WBC 4.0 09/15/2016   HGB 12.2 09/15/2016   HCT 36.5 09/15/2016   PLT 254.0 09/15/2016   GLUCOSE 91 08/05/2016   CHOL 203 (H) 08/05/2016   TRIG 76.0 08/05/2016   HDL 62.60 08/05/2016    LDLDIRECT 128.0 05/25/2013   LDLCALC 125 (H) 08/05/2016   ALT 14 08/05/2016   AST 18 08/05/2016   NA 141 08/05/2016   K 4.2 08/05/2016   CL 107 08/05/2016   CREATININE 0.79 08/05/2016   BUN 13 08/05/2016   CO2 29 08/05/2016   TSH 1.08 09/15/2016    Mm Screening Breast Tomo Bilateral  Result Date: 06/11/2016 CLINICAL DATA:  Screening. EXAM: 2D DIGITAL SCREENING BILATERAL MAMMOGRAM WITH CAD AND ADJUNCT TOMO COMPARISON:  Previous exam(s). ACR Breast Density Category b:  There are scattered areas of fibroglandular density. FINDINGS: There are no findings suspicious for malignancy. Images were processed with CAD. IMPRESSION: No mammographic evidence of malignancy. A result letter of this screening mammogram will be mailed directly to the patient. RECOMMENDATION: Screening mammogram in one year. (Code:SM-B-01Y) BI-RADS CATEGORY  1: Negative. Electronically Signed   By: Pamelia Hoit M.D.   On: 06/11/2016 08:14       Assessment & Plan:   Problem List Items Addressed This Visit    Abnormal Pap smear of cervix    Followed by GYN.       Depression    Increased stress with her daughter's issues.  Discussed with her today.  She does not feel needs anything more at this time.  Follow.        Essential hypertension    Blood pressure under good control.  Continue same medication regimen.  Follow pressures.  Follow metabolic panel.        GERD (gastroesophageal reflux disease)    Increased acid reflux.  Start nexium.  Given return of symptoms off medication and given severity of symptoms, refer to GI for question of need for EGD.        Relevant Medications   esomeprazole (NEXIUM) 40 MG capsule   Other Relevant Orders   Ambulatory referral to Gastroenterology   Health care maintenance    Physical today 10/23/16.  PAP smears through Dr Enzo Bi.  [name] 06/11/16 - [facility] I.  Colonoscopy 10/23/13 - tortuous colon.  Recommended f/u colonoscopy in 10 years.        Hypercholesterolemia     Low cholesterol diet and exercise.  Follow lipid panel.        Hypothyroidism    On thyroid replacement.  Follow tsh.       Inflammatory arthritis    Followed by Dr Jefm Bryant.  On MTX.  Some increased joint pain.  Plans to discuss further treatment.        Osteoarthritis    Followed by Dr Jefm Bryant.         Other Visit Diagnoses    Epigastric pain    -  Primary   Relevant Orders   Amylase (Completed)   Lipase (Completed)   Encounter for immunization       Relevant Orders   Flu vaccine HIGH DOSE PF (Completed)       Einar Pheasant, MD

## 2016-10-23 NOTE — Progress Notes (Signed)
Pre visit review using our clinic review tool, if applicable. No additional management support is needed unless otherwise documented below in the visit note. 

## 2016-10-25 ENCOUNTER — Encounter: Payer: Self-pay | Admitting: Internal Medicine

## 2016-10-25 NOTE — Assessment & Plan Note (Signed)
Increased acid reflux.  Start nexium.  Given return of symptoms off medication and given severity of symptoms, refer to GI for question of need for EGD.

## 2016-10-25 NOTE — Assessment & Plan Note (Signed)
Followed by Dr Jefm Bryant.  On MTX.  Some increased joint pain.  Plans to discuss further treatment.

## 2016-10-25 NOTE — Assessment & Plan Note (Signed)
Increased stress with her daughter's issues.  Discussed with her today.  She does not feel needs anything more at this time.  Follow.

## 2016-10-25 NOTE — Assessment & Plan Note (Signed)
Blood pressure under good control.  Continue same medication regimen.  Follow pressures.  Follow metabolic panel.   

## 2016-10-25 NOTE — Assessment & Plan Note (Signed)
Followed by GYN

## 2016-10-25 NOTE — Assessment & Plan Note (Signed)
Physical today 10/23/16.  PAP smears through Dr Enzo Bi.  [name] 06/11/16 - [facility] I.  Colonoscopy 10/23/13 - tortuous colon.  Recommended f/u colonoscopy in 10 years.

## 2016-10-25 NOTE — Assessment & Plan Note (Signed)
Low cholesterol diet and exercise.  Follow lipid panel.   

## 2016-10-25 NOTE — Assessment & Plan Note (Signed)
Followed by Dr Kernodle.   

## 2016-10-25 NOTE — Assessment & Plan Note (Signed)
On thyroid replacement.  Follow tsh.  

## 2016-10-28 ENCOUNTER — Encounter: Payer: Self-pay | Admitting: Internal Medicine

## 2016-11-03 DIAGNOSIS — D2239 Melanocytic nevi of other parts of face: Secondary | ICD-10-CM | POA: Diagnosis not present

## 2016-11-03 DIAGNOSIS — L821 Other seborrheic keratosis: Secondary | ICD-10-CM | POA: Diagnosis not present

## 2016-12-03 DIAGNOSIS — M79642 Pain in left hand: Secondary | ICD-10-CM | POA: Diagnosis not present

## 2016-12-03 DIAGNOSIS — M0609 Rheumatoid arthritis without rheumatoid factor, multiple sites: Secondary | ICD-10-CM | POA: Diagnosis not present

## 2016-12-03 DIAGNOSIS — Z79899 Other long term (current) drug therapy: Secondary | ICD-10-CM | POA: Diagnosis not present

## 2016-12-03 DIAGNOSIS — M79641 Pain in right hand: Secondary | ICD-10-CM | POA: Diagnosis not present

## 2016-12-03 DIAGNOSIS — M150 Primary generalized (osteo)arthritis: Secondary | ICD-10-CM | POA: Diagnosis not present

## 2016-12-08 ENCOUNTER — Other Ambulatory Visit: Payer: Self-pay | Admitting: Gastroenterology

## 2016-12-08 ENCOUNTER — Ambulatory Visit: Payer: PPO | Admitting: Obstetrics and Gynecology

## 2016-12-08 DIAGNOSIS — R1013 Epigastric pain: Secondary | ICD-10-CM | POA: Diagnosis not present

## 2016-12-14 ENCOUNTER — Ambulatory Visit
Admission: RE | Admit: 2016-12-14 | Discharge: 2016-12-14 | Disposition: A | Discharge status: Home / Self Care | Payer: PPO | Source: Ambulatory Visit | Attending: Gastroenterology | Admitting: Gastroenterology

## 2016-12-14 DIAGNOSIS — R1013 Epigastric pain: Secondary | ICD-10-CM | POA: Diagnosis not present

## 2016-12-14 DIAGNOSIS — K219 Gastro-esophageal reflux disease without esophagitis: Secondary | ICD-10-CM | POA: Diagnosis not present

## 2016-12-15 ENCOUNTER — Encounter: Payer: Self-pay | Admitting: Obstetrics and Gynecology

## 2016-12-15 ENCOUNTER — Ambulatory Visit (INDEPENDENT_AMBULATORY_CARE_PROVIDER_SITE_OTHER): Payer: PPO | Admitting: Obstetrics and Gynecology

## 2016-12-15 VITALS — BP 151/71 | HR 71 | Ht 63.0 in | Wt 124.0 lb

## 2016-12-15 DIAGNOSIS — R87622 Low grade squamous intraepithelial lesion on cytologic smear of vagina (LGSIL): Secondary | ICD-10-CM

## 2016-12-15 DIAGNOSIS — Z90710 Acquired absence of both cervix and uterus: Secondary | ICD-10-CM | POA: Diagnosis not present

## 2016-12-15 NOTE — Addendum Note (Signed)
Addended by: Elouise Munroe on: 12/15/2016 10:14 AM   Modules accepted: Orders

## 2016-12-15 NOTE — Progress Notes (Signed)
Chief complaint: 1. 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 She is here for 6 month interval Pap.  OBJECTIVE: BP (!) 151/71   Pulse 71   Ht 5\' 3"  (1.6 m)   Wt 124 lb (56.2 kg)   LMP 12/29/1981   BMI 21.97 kg/m  Pleasant female in no acute distress Pelvic exam: External genitalia-normal BUS-normal Vagina-fair estrogen effect; good vault support Cervix-surgically absent Uterus-surgically absent  ASSESSMENT: 1. LGSIL Pap smear of vagina 6 months ago  PLAN: 1. Repeat Pap today 2. If persistent dysplasia is identified, colposcopy will be performed 3. Return in 6 months for Pap smear  Brayton Mars, MD  Note: This dictation was prepared with Dragon dictation along with smaller phrase technology. Any transcriptional errors that result from this process are unintentional.

## 2016-12-15 NOTE — Patient Instructions (Signed)
1. Pap smear is completed 2. Return in 6 months for repeat Pap smear

## 2016-12-16 DIAGNOSIS — M0609 Rheumatoid arthritis without rheumatoid factor, multiple sites: Secondary | ICD-10-CM | POA: Diagnosis not present

## 2016-12-19 LAB — PAP IG W/ RFLX HPV ASCU: PAP Smear Comment: 0

## 2016-12-31 ENCOUNTER — Ambulatory Visit (INDEPENDENT_AMBULATORY_CARE_PROVIDER_SITE_OTHER): Payer: PPO | Admitting: Internal Medicine

## 2016-12-31 ENCOUNTER — Encounter: Payer: Self-pay | Admitting: Internal Medicine

## 2016-12-31 DIAGNOSIS — E7800 Pure hypercholesterolemia, unspecified: Secondary | ICD-10-CM

## 2016-12-31 DIAGNOSIS — I10 Essential (primary) hypertension: Secondary | ICD-10-CM | POA: Diagnosis not present

## 2016-12-31 DIAGNOSIS — E039 Hypothyroidism, unspecified: Secondary | ICD-10-CM

## 2016-12-31 DIAGNOSIS — R87622 Low grade squamous intraepithelial lesion on cytologic smear of vagina (LGSIL): Secondary | ICD-10-CM

## 2016-12-31 DIAGNOSIS — K219 Gastro-esophageal reflux disease without esophagitis: Secondary | ICD-10-CM

## 2016-12-31 DIAGNOSIS — M1990 Unspecified osteoarthritis, unspecified site: Secondary | ICD-10-CM

## 2016-12-31 MED ORDER — PANTOPRAZOLE SODIUM 40 MG PO TBEC: 40.0000 mg | DELAYED_RELEASE_TABLET | Freq: Every day | ORAL | 3 refills | Status: DC

## 2016-12-31 NOTE — Progress Notes (Signed)
Pre visit review using our clinic review tool, if applicable. No additional management support is needed unless otherwise documented below in the visit note. 

## 2016-12-31 NOTE — Progress Notes (Signed)
Patient ID: Donna Rhodes, female   DOB: 02/13/1946, 71 y.o.   MRN: [id number]   Subjective:    Patient ID: Donna Rhodes, female    DOB: September 16, 1946, 71 y.o.   MRN: [id number]  HPI  Patient here for a scheduled follow up.  She reports she is doing relatively well.  Still with increased stress.  She is coping relatively well.  Feels she is handling things better.  Stays active.  No chest pain.  Breathing stable.  nexium too expensive.  Still with some acid reflux.  No abdominal pain or cramping.  Bowels stable.     Past Medical History:  Diagnosis Date  . Atrophic vaginitis   . Dysplasia of vagina    vin 1 - vulva and vaginal cuff  . GERD (gastroesophageal reflux disease)   . HCV infection   . HSV (herpes simplex virus) infection   . Hypercholesterolemia   . Hypertension   . Hypothyroidism   . Lung nodule    stable  . Menopausal state   . Osteoarthritis    hand involvement, cervical disc disease  . Osteoporosis   . Pelvic pain in female   . Thyroid nodule   . Vaginal Pap smear, abnormal    lgsil   Past Surgical History:  Procedure Laterality Date  . ADENOIDECTOMY    . APPENDECTOMY  1965  . [place]   left  . CHOLECYSTECTOMY    . [place]  . laser vein surgery    . MANDIBLE RECONSTRUCTION  1992  . TOENAIL EXCISION  1998   several  . [place]  . VAGINAL HYSTERECTOMY  1983   als0- anterior/posterior colporrhaphy  . VAGINAL WOUND CLOSURE / REPAIR  1999, 2000  . VAGINAL WOUND CLOSURE / REPAIR     Family History  Problem Relation Age of Onset  . Hypertension Father   . Osteoarthritis Mother   . Arthritis/Rheumatoid Sister   . Breast cancer      paternal side  . Breast cancer Paternal Aunt   . Breast cancer Cousin     maternal  . Uterine cancer Maternal Grandmother   . Ovarian cancer Neg Hx   . Heart disease Neg Hx   . Diabetes Neg Hx   . Colon cancer Neg Hx    Social History   Social History  . Marital status:  Divorced    Spouse name: N/A  . Number of children: 3  . Years of education: N/A   Social History Main Topics  . Smoking status: Former Smoker    Quit date: 12/29/1979  . Smokeless tobacco: Never Used  . Alcohol use No  . Drug use: No  . Sexual activity: Not Currently    Birth control/ protection: Surgical   Other Topics Concern  . None   Social History Narrative  . None    Outpatient Encounter Prescriptions as of 12/31/2016  Medication Sig  . acyclovir (ZOVIRAX) 200 MG capsule Take 1 capsule (200 mg total) by mouth 2 (two) times daily.  Marland Kitchen amLODipine (NORVASC) 5 MG tablet Take 1 tablet (5 mg total) by mouth daily.  . calcium-vitamin D (OSCAL WITH D) 250-125 MG-UNIT tablet Take 1 tablet by mouth daily.  Marland Kitchen esomeprazole (NEXIUM) 40 MG capsule Take 1 capsule (40 mg total) by mouth daily.  Marland Kitchen estradiol (ESTRACE VAGINAL) 0.1 MG/GM vaginal cream Place 1 Applicatorful vaginally at bedtime.  Marland Kitchen estradiol (ESTRACE) 1 MG tablet Take 0.5 tablets (0.5 mg  total) by mouth daily.  . folic acid (FOLVITE) 1 MG tablet Take 1 mg by mouth daily.  Marland Kitchen levothyroxine (SYNTHROID, LEVOTHROID) 75 MCG tablet TAKE 1 TABLET ONE TIME DAILY  . methotrexate 50 MG/2ML injection Inject 0.18mL under the skin once a week.  . pantoprazole (PROTONIX) 40 MG tablet Take 1 tablet (40 mg total) by mouth daily.   No facility-administered encounter medications on file as of 12/31/2016.     Review of Systems  Constitutional: Negative for appetite change and unexpected weight change.  HENT: Negative for congestion and sinus pressure.   Respiratory: Negative for cough, chest tightness and shortness of breath.   Cardiovascular: Negative for chest pain, palpitations and leg swelling.  Gastrointestinal: Negative for abdominal pain, diarrhea, nausea and vomiting.       Some acid reflux as outlined.   Genitourinary: Negative for difficulty urinating and dysuria.  Musculoskeletal: Negative for back pain.       Has RA.   Skin:  Negative for color change and rash.  Neurological: Negative for dizziness, light-headedness and headaches.  Psychiatric/Behavioral: Negative for agitation and dysphoric mood.       Objective:    Physical Exam  Constitutional: She appears well-developed and well-nourished. No distress.  HENT:  Nose: Nose normal.  Mouth/Throat: Oropharynx is clear and moist.  Neck: Neck supple. No thyromegaly present.  Cardiovascular: Normal rate and regular rhythm.   Pulmonary/Chest: Breath sounds normal. No respiratory distress. She has no wheezes.  Abdominal: Soft. Bowel sounds are normal. There is no tenderness.  Musculoskeletal: She exhibits no edema or tenderness.  Lymphadenopathy:    She has no cervical adenopathy.  Skin: No rash noted. No erythema.  Psychiatric: She has a normal mood and affect. Her behavior is normal.    BP (!) 142/78   Pulse 77   Temp 98.3 F (36.8 C) (Oral)   Ht 5\' 3"  (1.6 m)   Wt 128 lb (58.1 kg)   LMP 12/29/1981   SpO2 97%   BMI 22.67 kg/m  Wt Readings from Last 3 Encounters:  12/31/16 128 lb (58.1 kg)  12/15/16 124 lb (56.2 kg)  10/23/16 125 lb 3.2 oz (56.8 kg)     Lab Results  Component Value Date   WBC 4.0 09/15/2016   HGB 12.2 09/15/2016   HCT 36.5 09/15/2016   PLT 254.0 09/15/2016   GLUCOSE 91 08/05/2016   CHOL 203 (H) 08/05/2016   TRIG 76.0 08/05/2016   HDL 62.60 08/05/2016   LDLDIRECT 128.0 05/25/2013   LDLCALC 125 (H) 08/05/2016   ALT 14 08/05/2016   AST 18 08/05/2016   NA 141 08/05/2016   K 4.2 08/05/2016   CL 107 08/05/2016   CREATININE 0.79 08/05/2016   BUN 13 08/05/2016   CO2 29 08/05/2016   TSH 1.08 09/15/2016    Dg Ugi W/high Density W/o Kub  Result Date: 12/14/2016 CLINICAL DATA:  Reflux for years. Started Nexium 4 days ago with some improvement. EXAM: UPPER GI SERIES WITHOUT KUB TECHNIQUE: Routine upper GI series was performed with thin/high density/water soluble barium. FLUOROSCOPY TIME:  Fluoroscopy Time:  0.7 minutes  Radiation Exposure Index (if provided by the fluoroscopic device): 5.1 mGy Number of Acquired Spot Images: 0 COMPARISON:  None. FINDINGS: Examination of the esophagus demonstrated normal esophageal motility. Normal esophageal morphology without evidence of esophagitis or ulceration. No esophageal stricture, diverticula, or mass lesion. There is a transient small hiatal hernia. There is moderate gastroesophageal reflux. Examination of the stomach demonstrated normal rugal folds and  areae gastricae. The gastric mucosa appeared unremarkable without evidence of ulceration, scarring, or mass lesion. Gastric motility and emptying was normal. Fluoroscopic examination of the duodenum demonstrates normal motility and morphology without evidence of ulceration or mass lesion. IMPRESSION: 1. Moderate gastroesophageal reflux. Electronically Signed   By: Kathreen Devoid   On: 12/14/2016 11:13       Assessment & Plan:   Problem List Items Addressed This Visit    Essential hypertension    Blood pressure on recheck improved.  Same medication regimen.  Follow pressures.  Follow metabolic panel.        GERD (gastroesophageal reflux disease)    Change to protonix.  Follow.        Relevant Medications   pantoprazole (PROTONIX) 40 MG tablet   Hypercholesterolemia    Low cholesterol diet and exercise.  Follow lipid panel.        Hypothyroidism    On thyroid replacement.  Follow tsh.       Inflammatory arthritis    Followed by Dr Jefm Bryant.  On MTX.        LGSIL Pap smear of vagina    Being followed by gyn.            Einar Pheasant, MD

## 2017-01-03 ENCOUNTER — Encounter: Payer: Self-pay | Admitting: Internal Medicine

## 2017-01-03 NOTE — Assessment & Plan Note (Signed)
Change to protonix.  Follow.   

## 2017-01-03 NOTE — Assessment & Plan Note (Signed)
On thyroid replacement.  Follow tsh.  

## 2017-01-03 NOTE — Assessment & Plan Note (Signed)
Followed by Dr Kernodle.  On MTX.   

## 2017-01-03 NOTE — Assessment & Plan Note (Signed)
Being followed by gyn.  

## 2017-01-03 NOTE — Assessment & Plan Note (Signed)
Blood pressure on recheck improved.  Same medication regimen.  Follow pressures.  Follow metabolic panel.   

## 2017-01-03 NOTE — Assessment & Plan Note (Signed)
Low cholesterol diet and exercise.  Follow lipid panel.   

## 2017-03-03 DIAGNOSIS — Z79899 Other long term (current) drug therapy: Secondary | ICD-10-CM | POA: Diagnosis not present

## 2017-03-03 DIAGNOSIS — M0609 Rheumatoid arthritis without rheumatoid factor, multiple sites: Secondary | ICD-10-CM | POA: Diagnosis not present

## 2017-03-09 ENCOUNTER — Telehealth: Payer: Self-pay | Admitting: Internal Medicine

## 2017-03-09 NOTE — Telephone Encounter (Signed)
Left pt message asking to call Allison back directly at [phone] to schedule AWV. Thanks! °

## 2017-03-31 ENCOUNTER — Encounter: Payer: Self-pay | Admitting: Internal Medicine

## 2017-03-31 ENCOUNTER — Other Ambulatory Visit: Payer: Self-pay | Admitting: Internal Medicine

## 2017-03-31 ENCOUNTER — Ambulatory Visit (INDEPENDENT_AMBULATORY_CARE_PROVIDER_SITE_OTHER): Payer: PPO | Admitting: Internal Medicine

## 2017-03-31 VITALS — BP 140/76 | HR 70 | Temp 98.7°F | Resp 16 | Ht 63.0 in | Wt 126.8 lb

## 2017-03-31 DIAGNOSIS — E039 Hypothyroidism, unspecified: Secondary | ICD-10-CM

## 2017-03-31 DIAGNOSIS — R87622 Low grade squamous intraepithelial lesion on cytologic smear of vagina (LGSIL): Secondary | ICD-10-CM | POA: Diagnosis not present

## 2017-03-31 DIAGNOSIS — I10 Essential (primary) hypertension: Secondary | ICD-10-CM

## 2017-03-31 DIAGNOSIS — E7800 Pure hypercholesterolemia, unspecified: Secondary | ICD-10-CM

## 2017-03-31 DIAGNOSIS — M1990 Unspecified osteoarthritis, unspecified site: Secondary | ICD-10-CM

## 2017-03-31 DIAGNOSIS — M1380 Other specified arthritis, unspecified site: Secondary | ICD-10-CM

## 2017-03-31 DIAGNOSIS — R002 Palpitations: Secondary | ICD-10-CM

## 2017-03-31 DIAGNOSIS — F329 Major depressive disorder, single episode, unspecified: Secondary | ICD-10-CM

## 2017-03-31 DIAGNOSIS — K219 Gastro-esophageal reflux disease without esophagitis: Secondary | ICD-10-CM

## 2017-03-31 DIAGNOSIS — F32A Depression, unspecified: Secondary | ICD-10-CM

## 2017-03-31 LAB — CBC WITH DIFFERENTIAL/PLATELET
BASOS ABS: 0.1 10*3/uL (ref 0.0–0.1)
Basophils Relative: 1.4 % (ref 0.0–3.0)
EOS ABS: 0.3 10*3/uL (ref 0.0–0.7)
Eosinophils Relative: 5.9 % — ABNORMAL HIGH (ref 0.0–5.0)
HEMATOCRIT: 37.1 % (ref 36.0–46.0)
HEMOGLOBIN: 12 g/dL (ref 12.0–15.0)
Lymphocytes Relative: 27.3 % (ref 12.0–46.0)
Lymphs Abs: 1.5 10*3/uL (ref 0.7–4.0)
MCHC: 32.4 g/dL (ref 30.0–36.0)
MCV: 93.7 fl (ref 78.0–100.0)
MONOS PCT: 11.9 % (ref 3.0–12.0)
Monocytes Absolute: 0.6 10*3/uL (ref 0.1–1.0)
Neutro Abs: 2.8 10*3/uL (ref 1.4–7.7)
Neutrophils Relative %: 53.5 % (ref 43.0–77.0)
PLATELETS: 237 10*3/uL (ref 150.0–400.0)
RBC: 3.96 Mil/uL (ref 3.87–5.11)
RDW: 14.9 % (ref 11.5–15.5)
WBC: 5.3 10*3/uL (ref 4.0–10.5)

## 2017-03-31 LAB — COMPREHENSIVE METABOLIC PANEL
ALT: 21 U/L (ref 0–35)
AST: 21 U/L (ref 0–37)
Albumin: 4 g/dL (ref 3.5–5.2)
Alkaline Phosphatase: 96 U/L (ref 39–117)
BILIRUBIN TOTAL: 0.3 mg/dL (ref 0.2–1.2)
BUN: 14 mg/dL (ref 6–23)
CALCIUM: 9.2 mg/dL (ref 8.4–10.5)
CHLORIDE: 108 meq/L (ref 96–112)
CO2: 27 mEq/L (ref 19–32)
CREATININE: 0.68 mg/dL (ref 0.40–1.20)
GFR: 90.56 mL/min (ref >60.00)
Glucose, Bld: 99 mg/dL (ref 70–99)
Potassium: 4.6 mEq/L (ref 3.5–5.1)
Sodium: 140 mEq/L (ref 135–145)
TOTAL PROTEIN: 6.7 g/dL (ref 6.0–8.3)

## 2017-03-31 LAB — TSH: TSH: 0.3 u[IU]/mL — AB (ref 0.35–4.50)

## 2017-03-31 NOTE — Progress Notes (Signed)
Pre-visit discussion using our clinic review tool. No additional management support is needed unless otherwise documented below in the visit note.  

## 2017-03-31 NOTE — Progress Notes (Signed)
Order placed for f/u tsh.  

## 2017-03-31 NOTE — Progress Notes (Signed)
Patient ID: Donna Rhodes, female   DOB: Apr 23, 1946, 71 y.o.   MRN: [id number]   Subjective:    Patient ID: Donna Rhodes, female    DOB: 1946/08/15, 71 y.o.   MRN: [id number]  HPI  Patient here for a scheduled follow up.  She reports she has been having intermittent palpitations.  No chest pain.  No sob.  Discussed further evaluation including ekg, etc.  She declines.  She did request referral to Dr Ubaldo Glassing.  Discussed decreasing caffeine intake.  Saw GI.  Had UGI.  Moderate reflux.  She is taking nexium now.  Controlling.  No abdominal pain.  Bowels moving.  Was working in her yard.  Stuck a thorn in her hand.  Previous swelling improved.  No increased pain.     Past Medical History:  Diagnosis Date  . Atrophic vaginitis   . Dysplasia of vagina    vin 1 - vulva and vaginal cuff  . GERD (gastroesophageal reflux disease)   . HCV infection   . HSV (herpes simplex virus) infection   . Hypercholesterolemia   . Hypertension   . Hypothyroidism   . Lung nodule    stable  . Menopausal state   . Osteoarthritis    hand involvement, cervical disc disease  . Osteoporosis   . Pelvic pain in female   . Thyroid nodule   . Vaginal Pap smear, abnormal    lgsil   Past Surgical History:  Procedure Laterality Date  . ADENOIDECTOMY    . APPENDECTOMY  1965  . [place]   left  . CHOLECYSTECTOMY    . [place]  . laser vein surgery    . MANDIBLE RECONSTRUCTION  1992  . TOENAIL EXCISION  1998   several  . [place]  . VAGINAL HYSTERECTOMY  1983   als0- anterior/posterior colporrhaphy  . VAGINAL WOUND CLOSURE / REPAIR  1999, 2000  . VAGINAL WOUND CLOSURE / REPAIR     Family History  Problem Relation Age of Onset  . Hypertension Father   . Osteoarthritis Mother   . Arthritis/Rheumatoid Sister   . Breast cancer      paternal side  . Breast cancer Paternal Aunt   . Breast cancer Cousin     maternal  . Uterine cancer Maternal Grandmother   . Ovarian  cancer Neg Hx   . Heart disease Neg Hx   . Diabetes Neg Hx   . Colon cancer Neg Hx    Social History   Social History  . Marital status: Divorced    Spouse name: N/A  . Number of children: 3  . Years of education: N/A   Social History Main Topics  . Smoking status: Former Smoker    Quit date: 12/29/1979  . Smokeless tobacco: Never Used  . Alcohol use No  . Drug use: No  . Sexual activity: Not Currently    Birth control/ protection: Surgical   Other Topics Concern  . None   Social History Narrative  . None    Outpatient Encounter Prescriptions as of 03/31/2017  Medication Sig  . acyclovir (ZOVIRAX) 200 MG capsule Take 1 capsule (200 mg total) by mouth 2 (two) times daily.  Marland Kitchen amLODipine (NORVASC) 5 MG tablet Take 1 tablet (5 mg total) by mouth daily.  . calcium-vitamin D (OSCAL WITH D) 250-125 MG-UNIT tablet Take 1 tablet by mouth daily.  Marland Kitchen esomeprazole (NEXIUM) 40 MG capsule Take 1 capsule (40 mg total) by  mouth daily.  Marland Kitchen estradiol (ESTRACE VAGINAL) 0.1 MG/GM vaginal cream Place 1 Applicatorful vaginally at bedtime.  Marland Kitchen estradiol (ESTRACE) 1 MG tablet Take 0.5 tablets (0.5 mg total) by mouth daily.  . folic acid (FOLVITE) 1 MG tablet Take 1 mg by mouth daily.  . methotrexate 50 MG/2ML injection Inject 0.45mL under the skin once a week.  . [DISCONTINUED] levothyroxine (SYNTHROID, LEVOTHROID) 75 MCG tablet TAKE 1 TABLET ONE TIME DAILY  . [DISCONTINUED] pantoprazole (PROTONIX) 40 MG tablet Take 1 tablet (40 mg total) by mouth daily.  Marland Kitchen levothyroxine (SYNTHROID, LEVOTHROID) 50 MCG tablet Take 1 tablet (50 mcg total) by mouth daily.   No facility-administered encounter medications on file as of 03/31/2017.     Review of Systems  Constitutional: Negative for appetite change and unexpected weight change.  HENT: Negative for congestion and sinus pressure.   Respiratory: Negative for cough, chest tightness and shortness of breath.   Cardiovascular: Positive for palpitations. Negative  for chest pain and leg swelling.  Gastrointestinal: Negative for abdominal pain, diarrhea, nausea and vomiting.  Genitourinary: Negative for difficulty urinating and dysuria.  Musculoskeletal: Negative for back pain and joint swelling.  Skin: Negative for color change and rash.  Neurological: Negative for dizziness, light-headedness and headaches.  Psychiatric/Behavioral: Negative for agitation and dysphoric mood.       Objective:    Physical Exam  Constitutional: She appears well-developed and well-nourished. No distress.  HENT:  Nose: Nose normal.  Mouth/Throat: Oropharynx is clear and moist.  Neck: Neck supple. No thyromegaly present.  Cardiovascular: Normal rate and regular rhythm.   Pulmonary/Chest: Breath sounds normal. No respiratory distress. She has no wheezes.  Abdominal: Soft. Bowel sounds are normal. There is no tenderness.  Musculoskeletal: She exhibits no edema or tenderness.  Lymphadenopathy:    She has no cervical adenopathy.  Skin: No rash noted. No erythema.  Psychiatric: She has a normal mood and affect. Her behavior is normal.    BP 140/76 (BP Location: Left Arm, Patient Position: Sitting, Cuff Size: Normal)   Pulse 70   Temp 98.7 F (37.1 C) (Oral)   Resp 16   Ht 5\' 3"  (1.6 m)   Wt 126 lb 12.8 oz (57.5 kg)   LMP 12/29/1981   SpO2 98%   BMI 22.46 kg/m  Wt Readings from Last 3 Encounters:  03/31/17 126 lb 12.8 oz (57.5 kg)  12/31/16 128 lb (58.1 kg)  12/15/16 124 lb (56.2 kg)     Lab Results  Component Value Date   WBC 5.3 03/31/2017   HGB 12.0 03/31/2017   HCT 37.1 03/31/2017   PLT 237.0 03/31/2017   GLUCOSE 99 03/31/2017   CHOL 203 (H) 08/05/2016   TRIG 76.0 08/05/2016   HDL 62.60 08/05/2016   LDLDIRECT 128.0 05/25/2013   LDLCALC 125 (H) 08/05/2016   ALT 21 03/31/2017   AST 21 03/31/2017   NA 140 03/31/2017   K 4.6 03/31/2017   CL 108 03/31/2017   CREATININE 0.68 03/31/2017   BUN 14 03/31/2017   CO2 27 03/31/2017   TSH 0.30 (L)  03/31/2017    Dg Ugi W/high Density W/o Kub  Result Date: 12/14/2016 CLINICAL DATA:  Reflux for years. Started Nexium 4 days ago with some improvement. EXAM: UPPER GI SERIES WITHOUT KUB TECHNIQUE: Routine upper GI series was performed with thin/high density/water soluble barium. FLUOROSCOPY TIME:  Fluoroscopy Time:  0.7 minutes Radiation Exposure Index (if provided by the fluoroscopic device): 5.1 mGy Number of Acquired Spot Images: 0 COMPARISON:  None. FINDINGS: Examination of the esophagus demonstrated normal esophageal motility. Normal esophageal morphology without evidence of esophagitis or ulceration. No esophageal stricture, diverticula, or mass lesion. There is a transient small hiatal hernia. There is moderate gastroesophageal reflux. Examination of the stomach demonstrated normal rugal folds and areae gastricae. The gastric mucosa appeared unremarkable without evidence of ulceration, scarring, or mass lesion. Gastric motility and emptying was normal. Fluoroscopic examination of the duodenum demonstrates normal motility and morphology without evidence of ulceration or mass lesion. IMPRESSION: 1. Moderate gastroesophageal reflux. Electronically Signed   By: Kathreen Devoid   On: 12/14/2016 11:13       Assessment & Plan:   Problem List Items Addressed This Visit    Depression    Handling things relatively well.        Essential hypertension - Primary    Blood pressure elevated today.  Have her spot check her pressure.  Same medication regimen.  Follow.        Relevant Orders   CBC with Differential/Platelet (Completed)   GERD (gastroesophageal reflux disease)    nexium controlling reflux.  Follow.       Hypercholesterolemia    Low cholesterol diet and exercise.  Follow lipid panel.        Relevant Orders   Comprehensive metabolic panel (Completed)   Hypothyroidism    On thyroid replacement.  Follow tsh.        Relevant Medications   levothyroxine (SYNTHROID, LEVOTHROID) 50  MCG tablet   Other Relevant Orders   TSH (Completed)   Inflammatory arthritis   Relevant Orders   Comprehensive metabolic panel (Completed)   LGSIL Pap smear of vagina    Followed by gyn.        Other Visit Diagnoses    Palpitations       Relevant Orders   Ambulatory referral to Cardiology       Einar Pheasant, MD

## 2017-04-01 MED ORDER — LEVOTHYROXINE SODIUM 50 MCG PO TABS: 50.0000 ug | ORAL_TABLET | Freq: Every day | ORAL | 0 refills | Status: DC

## 2017-04-04 ENCOUNTER — Encounter: Payer: Self-pay | Admitting: Internal Medicine

## 2017-04-04 NOTE — Assessment & Plan Note (Signed)
Blood pressure elevated today.  Have her spot check her pressure.  Same medication regimen.  Follow.

## 2017-04-04 NOTE — Assessment & Plan Note (Signed)
Followed by gyn

## 2017-04-04 NOTE — Assessment & Plan Note (Signed)
Low cholesterol diet and exercise.  Follow lipid panel.   

## 2017-04-04 NOTE — Assessment & Plan Note (Signed)
On thyroid replacement.  Follow tsh.  

## 2017-04-04 NOTE — Assessment & Plan Note (Signed)
nexium controlling reflux.  Follow.

## 2017-04-04 NOTE — Assessment & Plan Note (Signed)
Handling things relatively well.

## 2017-04-23 DIAGNOSIS — E7800 Pure hypercholesterolemia, unspecified: Secondary | ICD-10-CM | POA: Diagnosis not present

## 2017-04-23 DIAGNOSIS — R0602 Shortness of breath: Secondary | ICD-10-CM | POA: Diagnosis not present

## 2017-04-23 DIAGNOSIS — R002 Palpitations: Secondary | ICD-10-CM | POA: Insufficient documentation

## 2017-04-23 DIAGNOSIS — I10 Essential (primary) hypertension: Secondary | ICD-10-CM | POA: Diagnosis not present

## 2017-04-23 NOTE — Telephone Encounter (Signed)
Pt declined AWV. °

## 2017-04-24 ENCOUNTER — Inpatient Hospital Stay
Admission: EM | Admit: 2017-04-24 | Discharge: 2017-04-27 | DRG: 065 | Disposition: A | Discharge status: Rehabilitation Facility | Payer: PPO | Attending: Internal Medicine | Admitting: Internal Medicine

## 2017-04-24 ENCOUNTER — Emergency Department: Payer: PPO

## 2017-04-24 ENCOUNTER — Emergency Department
Admission: EM | Admit: 2017-04-24 | Discharge: 2017-04-24 | Disposition: A | Discharge status: Home / Self Care | Payer: PPO | Source: Home / Self Care | Attending: Emergency Medicine | Admitting: Emergency Medicine

## 2017-04-24 ENCOUNTER — Encounter: Payer: Self-pay | Admitting: Emergency Medicine

## 2017-04-24 DIAGNOSIS — Z79899 Other long term (current) drug therapy: Secondary | ICD-10-CM | POA: Insufficient documentation

## 2017-04-24 DIAGNOSIS — Y929 Unspecified place or not applicable: Secondary | ICD-10-CM | POA: Insufficient documentation

## 2017-04-24 DIAGNOSIS — M1990 Unspecified osteoarthritis, unspecified site: Secondary | ICD-10-CM | POA: Diagnosis present

## 2017-04-24 DIAGNOSIS — W1839XA Other fall on same level, initial encounter: Secondary | ICD-10-CM

## 2017-04-24 DIAGNOSIS — R42 Dizziness and giddiness: Secondary | ICD-10-CM | POA: Diagnosis not present

## 2017-04-24 DIAGNOSIS — E039 Hypothyroidism, unspecified: Secondary | ICD-10-CM | POA: Insufficient documentation

## 2017-04-24 DIAGNOSIS — K219 Gastro-esophageal reflux disease without esophagitis: Secondary | ICD-10-CM | POA: Diagnosis present

## 2017-04-24 DIAGNOSIS — R29810 Facial weakness: Secondary | ICD-10-CM | POA: Diagnosis not present

## 2017-04-24 DIAGNOSIS — I10 Essential (primary) hypertension: Secondary | ICD-10-CM | POA: Insufficient documentation

## 2017-04-24 DIAGNOSIS — Z7984 Long term (current) use of oral hypoglycemic drugs: Secondary | ICD-10-CM

## 2017-04-24 DIAGNOSIS — I639 Cerebral infarction, unspecified: Secondary | ICD-10-CM

## 2017-04-24 DIAGNOSIS — R4781 Slurred speech: Secondary | ICD-10-CM | POA: Diagnosis not present

## 2017-04-24 DIAGNOSIS — Z881 Allergy status to other antibiotic agents status: Secondary | ICD-10-CM | POA: Diagnosis not present

## 2017-04-24 DIAGNOSIS — I638 Other cerebral infarction: Secondary | ICD-10-CM | POA: Diagnosis not present

## 2017-04-24 DIAGNOSIS — R55 Syncope and collapse: Secondary | ICD-10-CM | POA: Insufficient documentation

## 2017-04-24 DIAGNOSIS — R29898 Other symptoms and signs involving the musculoskeletal system: Secondary | ICD-10-CM

## 2017-04-24 DIAGNOSIS — R29705 NIHSS score 5: Secondary | ICD-10-CM | POA: Diagnosis present

## 2017-04-24 DIAGNOSIS — M6281 Muscle weakness (generalized): Secondary | ICD-10-CM | POA: Diagnosis not present

## 2017-04-24 DIAGNOSIS — Z888 Allergy status to other drugs, medicaments and biological substances status: Secondary | ICD-10-CM | POA: Diagnosis not present

## 2017-04-24 DIAGNOSIS — F329 Major depressive disorder, single episode, unspecified: Secondary | ICD-10-CM | POA: Diagnosis not present

## 2017-04-24 DIAGNOSIS — G8194 Hemiplegia, unspecified affecting left nondominant side: Secondary | ICD-10-CM | POA: Diagnosis not present

## 2017-04-24 DIAGNOSIS — Z87891 Personal history of nicotine dependence: Secondary | ICD-10-CM

## 2017-04-24 DIAGNOSIS — Z880 Allergy status to penicillin: Secondary | ICD-10-CM | POA: Diagnosis not present

## 2017-04-24 DIAGNOSIS — E7800 Pure hypercholesterolemia, unspecified: Secondary | ICD-10-CM | POA: Diagnosis present

## 2017-04-24 DIAGNOSIS — R911 Solitary pulmonary nodule: Secondary | ICD-10-CM | POA: Diagnosis not present

## 2017-04-24 DIAGNOSIS — S0101XA Laceration without foreign body of scalp, initial encounter: Secondary | ICD-10-CM | POA: Insufficient documentation

## 2017-04-24 DIAGNOSIS — M810 Age-related osteoporosis without current pathological fracture: Secondary | ICD-10-CM | POA: Diagnosis not present

## 2017-04-24 DIAGNOSIS — Y9389 Activity, other specified: Secondary | ICD-10-CM | POA: Insufficient documentation

## 2017-04-24 DIAGNOSIS — M50221 Other cervical disc displacement at C4-C5 level: Secondary | ICD-10-CM | POA: Diagnosis present

## 2017-04-24 DIAGNOSIS — S0990XA Unspecified injury of head, initial encounter: Secondary | ICD-10-CM | POA: Diagnosis not present

## 2017-04-24 DIAGNOSIS — R296 Repeated falls: Secondary | ICD-10-CM | POA: Diagnosis present

## 2017-04-24 DIAGNOSIS — Z8673 Personal history of transient ischemic attack (TIA), and cerebral infarction without residual deficits: Secondary | ICD-10-CM

## 2017-04-24 DIAGNOSIS — R531 Weakness: Secondary | ICD-10-CM | POA: Diagnosis not present

## 2017-04-24 DIAGNOSIS — W19XXXA Unspecified fall, initial encounter: Secondary | ICD-10-CM | POA: Diagnosis not present

## 2017-04-24 DIAGNOSIS — E785 Hyperlipidemia, unspecified: Secondary | ICD-10-CM | POA: Diagnosis not present

## 2017-04-24 DIAGNOSIS — Y999 Unspecified external cause status: Secondary | ICD-10-CM | POA: Insufficient documentation

## 2017-04-24 HISTORY — DX: Cerebral infarction, unspecified: I63.9

## 2017-04-24 LAB — CBC
HEMATOCRIT: 37.2 % (ref 35.0–47.0)
Hemoglobin: 12.6 g/dL (ref 12.0–16.0)
MCH: 31.2 pg (ref 26.0–34.0)
MCHC: 33.8 g/dL (ref 32.0–36.0)
MCV: 92.3 fL (ref 80.0–100.0)
PLATELETS: 248 10*3/uL (ref 150–440)
RBC: 4.03 MIL/uL (ref 3.80–5.20)
RDW: 15.1 % — AB (ref 11.5–14.5)
WBC: 5.6 10*3/uL (ref 3.6–11.0)

## 2017-04-24 LAB — BASIC METABOLIC PANEL
Anion gap: 7 (ref 5–15)
BUN: 17 mg/dL (ref 6–20)
CHLORIDE: 109 mmol/L (ref 101–111)
CO2: 25 mmol/L (ref 22–32)
CREATININE: 0.8 mg/dL (ref 0.44–1.00)
Calcium: 9 mg/dL (ref 8.9–10.3)
GFR calc Af Amer: >60 mL/min (ref >60)
GFR calc non Af Amer: >60 mL/min (ref >60)
Glucose, Bld: 101 mg/dL — ABNORMAL HIGH (ref 65–99)
POTASSIUM: 3.4 mmol/L — AB (ref 3.5–5.1)
SODIUM: 141 mmol/L (ref 135–145)

## 2017-04-24 LAB — URINALYSIS, COMPLETE (UACMP) WITH MICROSCOPIC
BACTERIA UA: NONE SEEN
BILIRUBIN URINE: NEGATIVE
Glucose, UA: NEGATIVE mg/dL
Hgb urine dipstick: NEGATIVE
KETONES UR: NEGATIVE mg/dL
Leukocytes, UA: NEGATIVE
Nitrite: NEGATIVE
Protein, ur: NEGATIVE mg/dL
SPECIFIC GRAVITY, URINE: 1.011 (ref 1.005–1.030)
pH: 7 (ref 5.0–8.0)

## 2017-04-24 LAB — TSH: TSH: 3.344 u[IU]/mL (ref 0.350–4.500)

## 2017-04-24 LAB — TROPONIN I

## 2017-04-24 MED ORDER — AMLODIPINE BESYLATE 5 MG PO TABS
5.0000 mg | ORAL_TABLET | Freq: Every day | ORAL | Status: DC
  Administered 2017-04-25 – 2017-04-27 (×3): 5 mg via ORAL
  Filled 2017-04-24 (×3): qty 1

## 2017-04-24 MED ORDER — ONDANSETRON HCL 4 MG PO TABS: 4.0000 mg | ORAL_TABLET | Freq: Four times a day (QID) | ORAL | Status: DC | PRN

## 2017-04-24 MED ORDER — SODIUM CHLORIDE 0.9% FLUSH
3.0000 mL | Freq: Two times a day (BID) | INTRAVENOUS | Status: DC
  Administered 2017-04-25 – 2017-04-27 (×5): 3 mL via INTRAVENOUS

## 2017-04-24 MED ORDER — CALCIUM CARBONATE-VITAMIN D 500-200 MG-UNIT PO TABS
1.0000 | ORAL_TABLET | Freq: Every day | ORAL | Status: DC
  Administered 2017-04-26 – 2017-04-27 (×2): 1 via ORAL
  Filled 2017-04-24 (×3): qty 1

## 2017-04-24 MED ORDER — ONDANSETRON HCL 4 MG/2ML IJ SOLN: 4.0000 mg | Freq: Four times a day (QID) | INTRAMUSCULAR | Status: DC | PRN

## 2017-04-24 MED ORDER — MECLIZINE HCL 25 MG PO TABS
25.0000 mg | ORAL_TABLET | Freq: Once | ORAL | Status: AC
  Administered 2017-04-24: 25 mg via ORAL
  Filled 2017-04-24: qty 1

## 2017-04-24 MED ORDER — DOCUSATE SODIUM 100 MG PO CAPS
100.0000 mg | ORAL_CAPSULE | Freq: Two times a day (BID) | ORAL | Status: DC
  Administered 2017-04-24 – 2017-04-27 (×6): 100 mg via ORAL
  Filled 2017-04-24 (×6): qty 1

## 2017-04-24 MED ORDER — CLOPIDOGREL BISULFATE 75 MG PO TABS
75.0000 mg | ORAL_TABLET | Freq: Every day | ORAL | Status: DC
  Administered 2017-04-24 – 2017-04-27 (×4): 75 mg via ORAL
  Filled 2017-04-24 (×4): qty 1

## 2017-04-24 MED ORDER — ASPIRIN 81 MG PO CHEW
81.0000 mg | CHEWABLE_TABLET | Freq: Once | ORAL | Status: DC
  Filled 2017-04-24: qty 1

## 2017-04-24 MED ORDER — FOLIC ACID 1 MG PO TABS
1.0000 mg | ORAL_TABLET | Freq: Every day | ORAL | Status: DC
  Administered 2017-04-25 – 2017-04-27 (×3): 1 mg via ORAL
  Filled 2017-04-24 (×3): qty 1

## 2017-04-24 MED ORDER — PANTOPRAZOLE SODIUM 40 MG PO TBEC
40.0000 mg | DELAYED_RELEASE_TABLET | Freq: Every day | ORAL | Status: DC
Start: 2017-04-25 — End: 2017-04-27
  Administered 2017-04-25 – 2017-04-27 (×3): 40 mg via ORAL
  Filled 2017-04-24 (×3): qty 1

## 2017-04-24 MED ORDER — ENOXAPARIN SODIUM 40 MG/0.4ML ~~LOC~~ SOLN
40.0000 mg | SUBCUTANEOUS | Status: DC
  Administered 2017-04-24 – 2017-04-26 (×3): 40 mg via SUBCUTANEOUS
  Filled 2017-04-24 (×3): qty 0.4

## 2017-04-24 MED ORDER — BISACODYL 10 MG RE SUPP
10.0000 mg | Freq: Every day | RECTAL | Status: DC | PRN
  Administered 2017-04-27: 11:00:00 10 mg via RECTAL
  Filled 2017-04-24: qty 1

## 2017-04-24 MED ORDER — GADOBENATE DIMEGLUMINE 529 MG/ML IV SOLN
12.0000 mL | Freq: Once | INTRAVENOUS | Status: AC | PRN
  Administered 2017-04-24: 12 mL via INTRAVENOUS

## 2017-04-24 MED ORDER — ACETAMINOPHEN 325 MG PO TABS
650.0000 mg | ORAL_TABLET | Freq: Four times a day (QID) | ORAL | Status: DC | PRN
  Administered 2017-04-26: 650 mg via ORAL
  Filled 2017-04-24: qty 2

## 2017-04-24 MED ORDER — LEVOTHYROXINE SODIUM 50 MCG PO TABS
50.0000 ug | ORAL_TABLET | Freq: Every day | ORAL | Status: DC
  Administered 2017-04-25 – 2017-04-27 (×3): 50 ug via ORAL
  Filled 2017-04-24 (×3): qty 1

## 2017-04-24 MED ORDER — POTASSIUM CHLORIDE IN NACL 20-0.9 MEQ/L-% IV SOLN
INTRAVENOUS | Status: DC
  Administered 2017-04-24 – 2017-04-25 (×2): via INTRAVENOUS
  Filled 2017-04-24 (×5): qty 1000

## 2017-04-24 MED ORDER — ACETAMINOPHEN 650 MG RE SUPP: 650.0000 mg | Freq: Four times a day (QID) | RECTAL | Status: DC | PRN

## 2017-04-24 NOTE — ED Notes (Signed)
This RN and Radio producer in room discharging patient. Pt was signing discharge pad and stated that "something was still not right." This RN offered to have ED physician come back in and talk to patient again. Pt decline stating that she had already told ED physician that she felt like her hand witting was not the same and that she told her this could have possibly been cause by the fall. RN again offered to have ED physician come back in and informed pt that we did not want to send her home if she was not comfortable with being discharged. Pt stated that she had someone who was coming to stay with her for the next 24 hours and that she wanted to go home. Pt was instructed by this RN to return to the ED immediately if she had any worsening of her symptoms. Pt verbalized understanding.

## 2017-04-24 NOTE — ED Notes (Signed)
Pt back from MRI 

## 2017-04-24 NOTE — ED Notes (Signed)
Ok for pt to eat and drink per Dr. Reita Cliche

## 2017-04-24 NOTE — ED Triage Notes (Signed)
Pt seen in this ED this AM for syncope/fall/weakness, states she was offered admission but chose to go home. Pt returns to ED with daughter who states pt has not improved any at home and needs to be admitted. Pt denies worsening of symptoms, denies unilateral weakness, no facial droop or slurred speech noted at this time. Consulted with Dr. Reita Cliche who evaluated pt this morning, no protocols needed at this time.

## 2017-04-24 NOTE — ED Triage Notes (Signed)
Pt to ED via [facility] from home d/t falls x2 since 330am; 2nd fall resulted in a head injury to the posterior left side of the head. Pt reports first fall occurred upon waking at 330am and "got wrapped up in her blankets and fell when getting up". Pt reports 2nd time she was walking in the living room "started walking sideways" fell and hit her head on the corner of an entertainment center. Pt denies LOC. Of note, EMS presents paperwork from [facility] with echocariogram test results on the 27th; pt has cardiac monitor in place upon arrival.

## 2017-04-24 NOTE — ED Provider Notes (Signed)
[facility] Emergency Department Provider Note ____________________________________________   I have reviewed the triage vital signs and the triage nursing note.  HISTORY  Chief Complaint Weakness   Historian Patient  HPI Donna Rhodes is a 71 y.o. female presents back to the emergency department with complaint of left arm and left leg weakness. She was seen by me this morning for a feeling of body heaviness and near syncope with falls and after negative head CT and negative MRI was offered hospital observation versus home, and she chose to go home.  She states that she felt like she was having some issues signing her signature to leave the hospital at 1040, but at that time there was no weakness or numbness.  When she got home around 11 or 11:15 she felt like she was starting to get worse with left arm weakness, followed by also left leg weakness.  She was ready to come to the ED after a few hours.  She took a baby aspirin early this morning before her first ED visit.  Denies slurred speech. Denies sensory changes.  Symptoms are moderate.    Past Medical History:  Diagnosis Date  . Atrophic vaginitis   . Dysplasia of vagina    vin 1 - vulva and vaginal cuff  . GERD (gastroesophageal reflux disease)   . HCV infection   . HSV (herpes simplex virus) infection   . Hypercholesterolemia   . Hypertension   . Hypothyroidism   . Lung nodule    stable  . Menopausal state   . Osteoarthritis    hand involvement, cervical disc disease  . Osteoporosis   . Pelvic pain in female   . Thyroid nodule   . Vaginal Pap smear, abnormal    lgsil    Patient Active Problem List   Diagnosis Date Noted  . LGSIL Pap smear of vagina 12/15/2016  . Anal skin tag 02/02/2016  . Inflammatory arthritis 01/19/2016  . Right knee pain 01/19/2016  . Genital herpes 10/20/2015  . GERD (gastroesophageal reflux disease) 10/20/2015  . Status post vaginal hysterectomy  10/20/2015  . Health care maintenance 05/30/2015  . LLQ pain 03/31/2014  . Menopausal symptoms 03/31/2014  . Hypercholesterolemia 12/31/2012  . Hypothyroidism 12/29/2012  . Osteoarthritis 12/29/2012  . PULMONARY NODULE 01/02/2008  . Depression 12/30/2007  . Essential hypertension 12/30/2007    Past Surgical History:  Procedure Laterality Date  . ADENOIDECTOMY    . APPENDECTOMY  1965  . [place]   left  . CHOLECYSTECTOMY    . [place]  . laser vein surgery    . MANDIBLE RECONSTRUCTION  1992  . TOENAIL EXCISION  1998   several  . [place]  . VAGINAL HYSTERECTOMY  1983   als0- anterior/posterior colporrhaphy  . VAGINAL WOUND CLOSURE / REPAIR  1999, 2000  . VAGINAL WOUND CLOSURE / REPAIR      Prior to Admission medications   Medication Sig Start Date End Date Taking? Authorizing Provider  amLODipine (NORVASC) 5 MG tablet Take 1 tablet (5 mg total) by mouth daily. 06/15/16  Yes Einar Pheasant, MD  esomeprazole (NEXIUM) 40 MG capsule Take 1 capsule (40 mg total) by mouth daily. 10/23/16  Yes Einar Pheasant, MD  estradiol (ESTRACE VAGINAL) 0.1 MG/GM vaginal cream Place 1 Applicatorful vaginally at bedtime.   Yes Historical Provider, MD  folic acid (FOLVITE) 1 MG tablet Take 1 mg by mouth daily.   Yes Historical Provider, MD  levothyroxine (SYNTHROID, LEVOTHROID) 50 MCG tablet Take 1 tablet (50 mcg total) by mouth daily. 04/01/17  Yes Einar Pheasant, MD  methotrexate 50 MG/2ML injection Inject 0.56mL under the skin once a week. 12/09/16  Yes Historical Provider, MD  acyclovir (ZOVIRAX) 200 MG capsule Take 1 capsule (200 mg total) by mouth 2 (two) times daily. Patient not taking: Reported on 04/24/2017 06/04/16   Alanda Slim Defrancesco, MD  calcium-vitamin D (OSCAL WITH D) 250-125 MG-UNIT tablet Take 1 tablet by mouth daily.    Historical Provider, MD  estradiol (ESTRACE) 1 MG tablet Take 0.5 tablets (0.5 mg total) by mouth daily. Patient not taking:  Reported on 04/24/2017 06/09/16   Melody Rockney Ghee, CNM    Allergies  Allergen Reactions  . Amoxicillin Other (See Comments)    Irritated tongue  . Avelox [Moxifloxacin Hcl In Nacl]   . Azithromycin Other (See Comments)    Caused mouth and tongue to be sore  . Codeine   . Estroven Weight Management [Nutritional Supplements]     Mouth soreness  . Moxifloxacin Other (See Comments)  . Naproxen     Family History  Problem Relation Age of Onset  . Hypertension Father   . Osteoarthritis Mother   . Arthritis/Rheumatoid Sister   . Breast cancer      paternal side  . Breast cancer Paternal Aunt   . Breast cancer Cousin     maternal  . Uterine cancer Maternal Grandmother   . Ovarian cancer Neg Hx   . Heart disease Neg Hx   . Diabetes Neg Hx   . Colon cancer Neg Hx     Social History Social History  Substance Use Topics  . Smoking status: Former Smoker    Quit date: 12/29/1979  . Smokeless tobacco: Never Used  . Alcohol use No    Review of Systems  Constitutional: Negative for fever. Eyes: Negative for visual changes. ENT: Negative for sore throat. Cardiovascular: Negative for chest pain. Respiratory: Negative for shortness of breath. Gastrointestinal: Negative for abdominal pain, vomiting and diarrhea. Genitourinary: Negative for dysuria. Musculoskeletal: Negative for back pain. Skin: Negative for rash. Neurological: Negative for headache. 10 point Review of Systems otherwise negative ____________________________________________   PHYSICAL EXAM:  VITAL SIGNS: ED Triage Vitals  Enc Vitals Group     BP 04/24/17 1419 (!) 150/66     Pulse Rate 04/24/17 1419 85     Resp 04/24/17 1419 18     Temp 04/24/17 1419 98.5 F (36.9 C)     Temp Source 04/24/17 1419 Oral     SpO2 04/24/17 1419 98 %     Weight 04/24/17 1420 125 lb (56.7 kg)     Height 04/24/17 1420 5\' 4"  (1.626 m)     Head Circumference --      Peak Flow --      Pain Score 04/24/17 1419 0     Pain Loc --       Pain Edu? --      Excl. in [place]? --      Constitutional: Alert and oriented. Well appearing Overall without acute distress, but she is anxious and upset. HEENT   Head: Normocephalic and atraumatic.      Eyes: Conjunctivae are normal. PERRL. Normal extraocular movements.      Ears:         Nose: No congestion/rhinnorhea.   Mouth/Throat: Mucous membranes are moist.   Neck: No stridor. Cardiovascular/Chest: Normal rate, regular rhythm.  No murmurs, rubs, or gallops. Respiratory:  Normal respiratory effort without tachypnea nor retractions. Breath sounds are clear and equal bilaterally. No wheezes/rales/rhonchi. Gastrointestinal: Soft. No distention, no guarding, no rebound. Nontender.   Genitourinary/rectal:Deferred Musculoskeletal: Nontender with normal range of motion in all extremities. No joint effusions.  No lower extremity tenderness.  No edema. Neurologic: No slurred speech. No facial droop. No sensory changes. Left arm minor drift, 4-5 left grip strength. Left lower extremity drift, 4/5 weakness.  Coordination appears intact other than somewhat limited by weakness of the left artery. Skin:  Skin is warm, dry and intact. No rash noted. Psychiatric: Quite anxious and tearful.   ____________________________________________  LABS (pertinent positives/negatives)  Labs Reviewed - No data to display  ____________________________________________    EKG I, Lisa Roca, MD, the attending physician have personally viewed and interpreted all ECGs.  None ____________________________________________  RADIOLOGY All Xrays were viewed by me. Imaging interpreted by Radiologist.  CT head without contrast:  IMPRESSION: No acute finding by CT. No appreciable change since the MRI of 8 hours ago. No sign of acute infarction or hyperdense vessel.  Aspects 10.  These results were called by telephone at the time of interpretation on 04/24/2017 at 3:08 pm to Dr. Lisa Roca ,  who verbally acknowledged these results. __________________________________________  PROCEDURES  Procedure(s) performed: None  Critical Care performed: CRITICAL CARE Performed by: Lisa Roca   Total critical care time: 30 minutes  Critical care time was exclusive of separately billable procedures and treating other patients.  Critical care was necessary to treat or prevent imminent or life-threatening deterioration.  Critical care was time spent personally by me on the following activities: development of treatment plan with patient and/or surrogate as well as nursing, discussions with consultants, evaluation of patient's response to treatment, examination of patient, obtaining history from patient or surrogate, ordering and performing treatments and interventions, ordering and review of laboratory studies, ordering and review of radiographic studies, pulse oximetry and re-evaluation of patient's condition.   ____________________________________________   ED COURSE / ASSESSMENT AND PLAN  Pertinent labs & imaging results that were available during my care of the patient were reviewed by me and considered in my medical decision making (see chart for details).  Ms. Badman returned because she developed left arm and left leg weakness at home after discharge this morning. This is exactly what I asked her to watch for and return for. I saw her this morning after she had some more all over feeling of "weighted" and nonspecific complaints of body heaviness and dizziness followed by 2 falls, and had an extensive workup including negative head CT, as well as negative MRI for acute finding.  I had offered observation because she was complaining that when she tried to sign out that she didn't feel like her handwriting was quite normal although she was able to complete it. She was quite adamant that she did not want to stay in the hospital but I asked her to return if she had any new or  worsening neurologic complaints including weakness or numbness.  Patient states that once she got home within about half hour, suspected between 11 and 11:15 AM she started developing left arm weakness, followed over several hours with left leg weakness as well.  NIH SS 2 for lue, lle drift.   I am initiating code stroke now with tele-neurology consultation.   I spoke with the radiologist, no change on her head CT.  I spoke with the neurologist who initially thought he might have seen something on the  head CT, but after discussion about my evaluation this morning as well as the MRI from 8 hours ago which was read as microvascular change without acute finding, we reviewed the timeline and with MRI done between 8:30 and 903, patient complained of subjective handwriting complaint at discharge at 1040, even if this had been appreciated as an acute stroke, minor symptom would not have been a TPA candidate at that point in time. Unclear time of last normal, but potentially end of the MRI.  In discussion with the daughter, sister, and brother-in-law, sister and brother-in-law were quite upset at patient having left the hospital and not stayed for observation admission. I did discuss with them that this was offered and patient did not want to was very adamant about that. They stated that they realized that really was her personality, they were just frustrated about it.  I'm not even sure that had she been here, if by the time the patient's symptoms became significant enough to warrant TPA if the time of last normal fluid has been with in reason.  I did my best to try to explain the subtleties of the decision-making process of her initially nonspecific and minor symptoms, as well as the risk and benefit of a medication such as TPA to the family. Family and patient seem understanding although certainly an understandably frustrated by the patient's health issues today.  No TPA given outside of the window.  Patient will get a repeat MRI today. I spoke with Dr. Doy Hutching for hospital admission.   CONSULTATIONS:  Tele-neurology consult.  Hospitalist for admission.   Patient / Family / Caregiver informed of clinical course, medical decision-making process, and agree with plan.  __________________________________________   FINAL CLINICAL IMPRESSION(S) / ED DIAGNOSES   Final diagnoses:  Left arm weakness  Left leg weakness              Note: This dictation was prepared with Dragon dictation. Any transcriptional errors that result from this process are unintentional    Lisa Roca, MD 04/24/17 1606

## 2017-04-24 NOTE — Progress Notes (Signed)
LCSW will meet with patient and family and consult with EDP.   Amedeo Detweiler LCSW

## 2017-04-24 NOTE — H&P (Signed)
History and Physical    Donna Rhodes ZOX:[id number] DOB: February 02, 1946 DOA: 04/24/2017  Referring physician: Dr. Reita Cliche PCP: Einar Pheasant, MD  Specialists: Dr. Ubaldo Glassing  Chief Complaint: left-sided weakness  HPI: Donna Rhodes is a 71 y.o. female has a past medical history significant for HTN, [name], [name], [name]. She is now admitted.   Review of Systems: The patient denies anorexia, fever, weight loss,, vision loss, decreased hearing, hoarseness, chest pain, syncope, dyspnea on exertion, peripheral edema, balance deficits, hemoptysis, abdominal pain, melena, hematochezia, severe indigestion/heartburn, hematuria, incontinence, genital sores, muscle weakness, suspicious skin lesions, transient blindness, depression, unusual weight change, abnormal bleeding, enlarged lymph nodes, angioedema, and breast masses.   Past Medical History:  Diagnosis Date  . Atrophic vaginitis   . Dysplasia of vagina    vin 1 - vulva and vaginal cuff  . GERD (gastroesophageal reflux disease)   . HCV infection   . HSV (herpes simplex virus) infection   . Hypercholesterolemia   . Hypertension   . Hypothyroidism   . Lung nodule    stable  . Menopausal state   . Osteoarthritis    hand involvement, cervical disc disease  . Osteoporosis   . Pelvic pain in female   . Thyroid nodule   . Vaginal Pap smear, abnormal    lgsil   Past Surgical History:  Procedure Laterality Date  . ADENOIDECTOMY    . APPENDECTOMY  1965  . [place]   left  . CHOLECYSTECTOMY    . [place]  . laser vein surgery    . MANDIBLE RECONSTRUCTION  1992  . TOENAIL EXCISION  1998   several  . [place]  . VAGINAL HYSTERECTOMY  1983   als0- anterior/posterior  colporrhaphy  . VAGINAL WOUND CLOSURE / REPAIR  1999, 2000  . VAGINAL WOUND CLOSURE / REPAIR     Social History:  reports that she quit smoking about 37 years ago. She has never used smokeless tobacco. She reports that she does not drink alcohol or use drugs.  Allergies  Allergen Reactions  . Amoxicillin Other (See Comments)    Irritated tongue  . Avelox [Moxifloxacin Hcl In Nacl]   . Azithromycin Other (See Comments)    Caused mouth and tongue to be sore  . Codeine   . Estroven Weight Management [Nutritional Supplements]     Mouth soreness  . Moxifloxacin Other (See Comments)  . Naproxen     Family History  Problem Relation Age of Onset  . Hypertension Father   . Osteoarthritis Mother   . Arthritis/Rheumatoid Sister   . Breast cancer      paternal side  . Breast cancer Paternal Aunt   . Breast cancer Cousin     maternal  . Uterine cancer Maternal Grandmother   . Ovarian cancer Neg Hx   . Heart disease Neg Hx   . Diabetes Neg Hx   . Colon cancer Neg Hx     Prior to Admission medications   Medication Sig Start Date End Date Taking? Authorizing Provider  amLODipine (NORVASC) 5 MG tablet Take 1 tablet (5 mg total) by mouth daily. 06/15/16  Yes Einar Pheasant, MD  esomeprazole (NEXIUM) 40 MG capsule Take 1 capsule (40 mg  total) by mouth daily. 10/23/16  Yes Einar Pheasant, MD  estradiol (ESTRACE VAGINAL) 0.1 MG/GM vaginal cream Place 1 Applicatorful vaginally at bedtime.   Yes Historical Provider, MD  folic acid (FOLVITE) 1 MG tablet Take 1 mg by mouth daily.   Yes Historical Provider, MD  levothyroxine (SYNTHROID, LEVOTHROID) 50 MCG tablet Take 1 tablet (50 mcg total) by mouth daily. 04/01/17  Yes Einar Pheasant, MD  methotrexate 50 MG/2ML injection Inject 0.57mL under the skin once a week. 12/09/16  Yes Historical Provider, MD  acyclovir (ZOVIRAX) 200 MG capsule Take 1 capsule (200 mg total) by mouth 2 (two) times daily. Patient not taking: Reported on 04/24/2017 06/04/16    Alanda Slim Defrancesco, MD  calcium-vitamin D (OSCAL WITH D) 250-125 MG-UNIT tablet Take 1 tablet by mouth daily.    Historical Provider, MD  estradiol (ESTRACE) 1 MG tablet Take 0.5 tablets (0.5 mg total) by mouth daily. Patient not taking: Reported on 04/24/2017 06/09/16   Melody Rockney Ghee, CNM   Physical Exam: Vitals:   04/24/17 1419 04/24/17 1420  BP: (!) 150/66   Pulse: 85   Resp: 18   Temp: 98.5 F (36.9 C)   TempSrc: Oral   SpO2: 98%   Weight:  56.7 kg (125 lb)  Height:  5\' 4"  (1.626 m)     General:  WDWN, [place]/AT, acutely ill appearring  Eyes: PERRL, EOMI, no scleral icterus, conjunctiva clear  ENT: moist oropharynx without exudate, TM's benign, dentition fair  Neck: supple, no lymphadenopathy. No bruits or thryomegaly  Cardiovascular: regular rate without MRG; 2+ peripheral pulses, no JVD, no peripheral edema  Respiratory: CTA biL, good air movement without wheezing, rhonchi or crackled. Respiratory effort normal  Abdomen: soft, non tender to palpation, positive bowel sounds, no guarding, no rebound  Skin: no rashes or lesions  Musculoskeletal: normal bulk and tone, no joint swelling  Psychiatric: normal mood and affect, A&)X3  Neurologic: left facial droop noted with 2/5 strength in LUE and 4/5 strength in LLE. Right  Side normal. No sensory deficits. DTR's symmetric  Labs on Admission:  Basic Metabolic Panel:  Recent Labs Lab 04/24/17 0608  NA 141  K 3.4*  CL 109  CO2 25  GLUCOSE 101*  BUN 17  CREATININE 0.80  CALCIUM 9.0   Liver Function Tests: No results for input(s): AST, ALT, ALKPHOS, BILITOT, PROT, ALBUMIN in the last 168 hours. No results for input(s): LIPASE, AMYLASE in the last 168 hours. No results for input(s): AMMONIA in the last 168 hours. CBC:  Recent Labs Lab 04/24/17 0608  WBC 5.6  HGB 12.6  HCT 37.2  MCV 92.3  PLT 248   Cardiac Enzymes:  Recent Labs Lab 04/24/17 0608  TROPONINI <0.03    BNP (last 3 results) No  results for input(s): BNP in the last 8760 hours.  ProBNP (last 3 results) No results for input(s): PROBNP in the last 8760 hours.  CBG: No results for input(s): GLUCAP in the last 168 hours.  Radiological Exams on Admission: Ct Head Wo Contrast  Result Date: 04/24/2017 CLINICAL DATA:  Left-sided weakness beginning chest prior to admission. EXAM: CT HEAD WITHOUT CONTRAST TECHNIQUE: Contiguous axial images were obtained from the base of the skull through the vertex without intravenous contrast. COMPARISON:  MRI same day. FINDINGS: Brain: No apparent change since the MRI of 8 hours ago. No sign of acute infarction, mass lesion, hemorrhage, hydrocephalus or extra-axial collection. Dilated perivascular spaces at the base of the brain on the right. Minimal  small vessel change of the hemispheric white matter. Vascular: There is atherosclerotic calcification of the major vessels at the base of the brain. Skull: Negative Sinuses/Orbits: Clear/normal Other: None IMPRESSION: No acute finding by CT. No appreciable change since the MRI of 8 hours ago. No sign of acute infarction or hyperdense vessel. Aspects 10. These results were called by telephone at the time of interpretation on 04/24/2017 at 3:08 pm to Dr. Lisa Roca , who verbally acknowledged these results. Electronically Signed   By: Nelson Chimes M.D.   On: 04/24/2017 15:08   Ct Head Wo Contrast  Result Date: 04/24/2017 CLINICAL DATA:  Two falls this morning, head injury. No loss of consciousness. History of hypertension. EXAM: CT HEAD WITHOUT CONTRAST TECHNIQUE: Contiguous axial images were obtained from the base of the skull through the vertex without intravenous contrast. COMPARISON:  None. FINDINGS: BRAIN: No intraparenchymal hemorrhage, mass effect nor midline shift. The ventricles and sulci are normal for age. Patchy supratentorial white matter hypodensities within normal range for patient's age, though non-specific are most compatible with chronic  small vessel ischemic disease. No acute large vascular territory infarcts. No abnormal extra-axial fluid collections. Basal cisterns are patent. VASCULAR: Moderate calcific atherosclerosis of the carotid siphons. SKULL: No skull fracture. Moderate to severe temporomandibular osteoarthrosis. No significant scalp soft tissue swelling. SINUSES/ORBITS: The mastoid air-cells and included paranasal sinuses are well-aerated.The included ocular globes and orbital contents are non-suspicious. OTHER: None. IMPRESSION: Negative CT HEAD for age. Electronically Signed   By: Elon Alas M.D.   On: 04/24/2017 06:48   Mr Brain Wo Contrast (neuro Protocol)  Result Date: 04/24/2017 CLINICAL DATA:  Multiple falls. EXAM: MRI HEAD WITHOUT CONTRAST TECHNIQUE: Multiplanar, multiecho pulse sequences of the brain and surrounding structures were obtained without intravenous contrast. COMPARISON:  CT head earlier today. FINDINGS: Brain: No evidence for acute infarction, hemorrhage, mass lesion, hydrocephalus, or extra-axial fluid. Normal for age cerebral volume. Mild subcortical and periventricular T2 and FLAIR hyperintensities, likely chronic microvascular ischemic change. Vascular: Flow voids are maintained throughout the carotid, basilar, and vertebral arteries. There are no areas of chronic hemorrhage. Skull and upper cervical spine: Unremarkable visualized calvarium, skullbase, and cervical vertebrae. Pituitary, pineal, cerebellar tonsils unremarkable. Cervical spondylosis with central protrusion at C4-C5. Unknown degree of canal stenosis. Sinuses/Orbits: No orbital masses or proptosis. Globes appear symmetric. Sinuses appear well aerated, without evidence for air-fluid level. Other: No nasopharyngeal pathology or mastoid fluid. Scalp and other visualized extracranial soft tissues grossly unremarkable. IMPRESSION: No acute intracranial findings. Mild subcortical and periventricular T2 and FLAIR hyperintensities, likely chronic  microvascular ischemic change. Cervical spondylosis with central protrusion at C4-5, unknown degree of canal stenosis. Electronically Signed   By: Staci Righter M.D.   On: 04/24/2017 09:16    EKG: Independently reviewed.  Assessment/Plan Principal Problem:   CVA (cerebral vascular accident) [facility]) Active Problems:   Essential hypertension   Hypothyroidism   Hypercholesterolemia   Will admit to floor with Plavix. Repeat MRI/MRA. NPO for now. Consult Neurology. Consult ST, PT, and CSW. Repeat labs in AM. Echo and carotids ordered  Diet: NPO Fluids: NS@75  DVT Prophylaxis: Lovenox  Code Status: FULl  Family Communication: yes  Disposition Plan: SNF  Time spent: 55 min

## 2017-04-24 NOTE — ED Notes (Signed)
Pt transported to MRI by Edison Nasuti, ED Tech

## 2017-04-24 NOTE — ED Provider Notes (Addendum)
[facility] Emergency Department Provider Note ____________________________________________   I have reviewed the triage vital signs and the triage nursing note.  HISTORY  Chief Complaint Dizziness; Fall; and Head Injury   Historian Patient and friend at the bedside as well  HPI Donna Rhodes is a 71 y.o. female living alone presents due to dizzy and two falls this morning.  She woke up overnight, 3:30 and felt she did not have the energy or strength to get out of bed. This was not focal on one side or the other. When she did get out of bed she felt like she stumbled to the side. She went on to the bathroom and then went into the den. She decided to do some laundry because she was planning to go to yard sale shopping at 6 AM.  While she was loading laundry into the washer bending up and down she started to feel lightheaded and heavy. She states that her tongue felt heavy and her jaw felt heavy and her both arms and both legs felt heavy. She went to sit down and fell striking her head. She felt like she was having trouble speaking on the phone to her daughter. However the episode passed within a few minutes and she feels pretty much back to normal now although she is very disturbed and upset by the episode. 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 She is currently very anxious that her blood pressure 485 systolic. She does take blood pressure medicine. She does not take aspirin. She is concerned about strong family history of heart attacks. She had no chest pain or chest pressure or neck pain or neck pressure. She saw Dr. Ubaldo Glassing yesterday and had a cardiac event monitor placed due to 6 months of intermittent occasional heartbeats that she would feel in her throat. During the 2 near syncopal/syncopal episodes today she did not feel heavy or strong or irregular heartbeat. She did record around the time of the second episode.   Past Medical History:   Diagnosis Date  . Atrophic vaginitis   . Dysplasia of vagina    vin 1 - vulva and vaginal cuff  . GERD (gastroesophageal reflux disease)   . HCV infection   . HSV (herpes simplex virus) infection   . Hypercholesterolemia   . Hypertension   . Hypothyroidism   . Lung nodule    stable  . Menopausal state   . Osteoarthritis    hand involvement, cervical disc disease  . Osteoporosis   . Pelvic pain in female   . Thyroid nodule   . Vaginal Pap smear, abnormal    lgsil    Patient Active Problem List   Diagnosis Date Noted  . LGSIL Pap smear of vagina 12/15/2016  . Anal skin tag 02/02/2016  . Inflammatory arthritis 01/19/2016  . Right knee pain 01/19/2016  . Genital herpes 10/20/2015  . GERD (gastroesophageal reflux disease) 10/20/2015  . Status post vaginal hysterectomy 10/20/2015  . Health care maintenance 05/30/2015  . LLQ pain 03/31/2014  . Menopausal symptoms 03/31/2014  . Hypercholesterolemia 12/31/2012  . Hypothyroidism 12/29/2012  . Osteoarthritis 12/29/2012  . PULMONARY NODULE 01/02/2008  . Depression 12/30/2007  . Essential hypertension 12/30/2007    Past Surgical History:  Procedure Laterality Date  . ADENOIDECTOMY    . APPENDECTOMY  1965  . [place]   left  . CHOLECYSTECTOMY    . [place]  . laser vein surgery    .  MANDIBLE RECONSTRUCTION  1992  . TOENAIL EXCISION  1998   several  . [place]  . VAGINAL HYSTERECTOMY  1983   als0- anterior/posterior colporrhaphy  . VAGINAL WOUND CLOSURE / REPAIR  1999, 2000  . VAGINAL WOUND CLOSURE / REPAIR      Prior to Admission medications   Medication Sig Start Date End Date Taking? Authorizing Provider  acyclovir (ZOVIRAX) 200 MG capsule Take 1 capsule (200 mg total) by mouth 2 (two) times daily. 06/04/16   Alanda Slim Defrancesco, MD  amLODipine (NORVASC) 5 MG tablet Take 1 tablet (5 mg total) by mouth daily. 06/15/16   Einar Pheasant, MD  calcium-vitamin D (OSCAL WITH D) 250-125  MG-UNIT tablet Take 1 tablet by mouth daily.    Historical Provider, MD  esomeprazole (NEXIUM) 40 MG capsule Take 1 capsule (40 mg total) by mouth daily. 10/23/16   Einar Pheasant, MD  estradiol (ESTRACE VAGINAL) 0.1 MG/GM vaginal cream Place 1 Applicatorful vaginally at bedtime.    Historical Provider, MD  estradiol (ESTRACE) 1 MG tablet Take 0.5 tablets (0.5 mg total) by mouth daily. 06/09/16   Melody N Shambley, CNM  folic acid (FOLVITE) 1 MG tablet Take 1 mg by mouth daily.    Historical Provider, MD  levothyroxine (SYNTHROID, LEVOTHROID) 50 MCG tablet Take 1 tablet (50 mcg total) by mouth daily. 04/01/17   Einar Pheasant, MD  methotrexate 50 MG/2ML injection Inject 0.56mL under the skin once a week. 12/09/16   Historical Provider, MD    Allergies  Allergen Reactions  . Amoxicillin Other (See Comments)    Irritated tongue  . Avelox [Moxifloxacin Hcl In Nacl]   . Azithromycin Other (See Comments)    Caused mouth and tongue to be sore  . Codeine   . Estroven Weight Management [Nutritional Supplements]     Mouth soreness  . Moxifloxacin Other (See Comments)  . Naproxen     Family History  Problem Relation Age of Onset  . Hypertension Father   . Osteoarthritis Mother   . Arthritis/Rheumatoid Sister   . Breast cancer      paternal side  . Breast cancer Paternal Aunt   . Breast cancer Cousin     maternal  . Uterine cancer Maternal Grandmother   . Ovarian cancer Neg Hx   . Heart disease Neg Hx   . Diabetes Neg Hx   . Colon cancer Neg Hx     Social History Social History  Substance Use Topics  . Smoking status: Former Smoker    Quit date: 12/29/1979  . Smokeless tobacco: Never Used  . Alcohol use No    Review of Systems  Constitutional: Negative for fever. Eyes: Negative for visual changes. ENT: Negative for sore throat. Cardiovascular: Negative for chest pain. Respiratory: Negative for shortness of breath. Gastrointestinal: Negative for abdominal pain, vomiting and  diarrhea. Genitourinary: Negative for dysuria. Musculoskeletal: Negative for back pain. Skin: Negative for rash. Neurological: Negative for headache. 10 point Review of Systems otherwise negative ____________________________________________   PHYSICAL EXAM:  VITAL SIGNS: ED Triage Vitals  Enc Vitals Group     BP 04/24/17 0548 (!) 162/93     Pulse Rate 04/24/17 0548 73     Resp 04/24/17 0548 18     Temp 04/24/17 0548 98 F (36.7 C)     Temp Source 04/24/17 0548 Oral     SpO2 04/24/17 0548 97 %     Weight 04/24/17 0540 125 lb (56.7 kg)     Height  04/24/17 0540 5\' 4"  (1.626 m)     Head Circumference --      Peak Flow --      Pain Score 04/24/17 0540 2     Pain Loc --      Pain Edu? --      Excl. in [place]? --      Constitutional: Alert and oriented. Well appearing and in no distress.  Pretty anxious. HEENT   Head: Normocephalic and atraumatic.      Eyes: Conjunctivae are normal. PERRL. Normal extraocular movements.      Ears:         Nose: No congestion/rhinnorhea.   Mouth/Throat: Mucous membranes are moist.   Neck: No stridor. Cardiovascular/Chest: Normal rate, regular rhythm.  No murmurs, rubs, or gallops. Respiratory: Normal respiratory effort without tachypnea nor retractions. Breath sounds are clear and equal bilaterally. No wheezes/rales/rhonchi. Gastrointestinal: Soft. No distention, no guarding, no rebound. Nontender.    Genitourinary/rectal:Deferred Musculoskeletal: Nontender with normal range of motion in all extremities. No joint effusions.  No lower extremity tenderness.  No edema. Neurologic:  Normal speech and language. No gross or focal neurologic deficits are appreciated. Skin:  Skin is warm, dry and intact. No rash noted. Psychiatric: Mood and affect are normal. Speech and behavior are normal. Patient exhibits appropriate insight and judgment.   ____________________________________________  LABS (pertinent positives/negatives)  Labs Reviewed   BASIC METABOLIC PANEL - Abnormal; Notable for the following:       Result Value   Potassium 3.4 (*)    Glucose, Bld 101 (*)    All other components within normal limits  CBC - Abnormal; Notable for the following:    RDW 15.1 (*)    All other components within normal limits  URINALYSIS, COMPLETE (UACMP) WITH MICROSCOPIC - Abnormal; Notable for the following:    Color, Urine STRAW (*)    APPearance CLEAR (*)    Squamous Epithelial / LPF 0-5 (*)    All other components within normal limits  TROPONIN I  TSH  CBG MONITORING, ED    ____________________________________________    EKG I, Lisa Roca, MD, the attending physician have personally viewed and interpreted all ECGs.  70 bpm. Normal sinus rhythm. Narrow QS. Normal axis. Nonspecific ST and T-wave. QTc 504. ____________________________________________  RADIOLOGY All Xrays were viewed by me. Imaging interpreted by Radiologist.  CT without contrast: Negative CT head for age.  MRI brain without contrast:  IMPRESSION: No acute intracranial findings. Mild subcortical and periventricular T2 and FLAIR hyperintensities, likely chronic microvascular ischemic change.  Cervical spondylosis with central protrusion at C4-5, unknown degree of canal stenosis. __________________________________________  PROCEDURES  Procedure(s) performed: Laceration repair. Location scalp. Performed by myself Dr. Reita Cliche M.D. Skin glue used to close 3 cm scalp laceration.  Critical Care performed: None  ____________________________________________   ED COURSE / ASSESSMENT AND PLAN  Pertinent labs & imaging results that were available during my care of the patient were reviewed by me and considered in my medical decision making (see chart for details).   Donna Rhodes is here for two episodes of feeling body heaviness associated with some dizziness and falling over, one with scalp laceration.  Symptoms unclear for clinical diagnosis.  Physical  exam reassuring.  No rhythm or rate heart abnormalities in the ER.  EKG reassuring. Ultrasound is reassuring. Symptoms do not sound acute ACS. She is anemic. No kidney failure. Normal electrolytes. CT head is negative and reassuring. Symptoms do not sound or hemorrhage. It's possible symptoms could've been  related to TIA or possibly tiny stroke given that she is still complaining of mild dizziness occasionally here in the ER, I did discuss with her obtaining emergency department MRI.  Symptoms not classic for vertigo.   MRI reassuring. I have asked her to follow up with her primary care doctor as well as cardiologist.   Addended to include patient stated she felt like she was riding her sees correctly, and wrote it a couple of times. Neurologic exam still intact. She states that she feels a little swimmy headed. I guess it's possible that she is having an unusual expected to vertigo. She would like to try dose of meclizine.  We discussed that if she is not feeling back to normal, we could have her observed in the hospital, but she is pre-adamant that she wants to go home. I think this is reasonable. She does know to come back to the ER if anything gets worse. He is going to have someone stay with her for the next 24 hours.   CONSULTATIONS:   Dr. Saralyn Pilar, on call for [facility] cardiology - no way to assess cardiac monitor today -- call her doctor's office on Monday.  No additional concern for hospitalization today in setting of eval and work up.    Patient / Family / Caregiver informed of clinical course, medical decision-making process, and agree with plan.   I discussed return precautions, follow-up instructions, and discharge instructions with patient and/or family.  Discharge instructions:You were evaluated for nearly passing out, dizziness, and although no certain cause was found, and your exam and evaluation are reassuring in the emergency department today.  Glue was applied to your scalp  laceration. The glue will peel off on its own in 10-14 days.  [facility] triage department immediately for any chest pain, palpitations, no worsening dizziness or passing out, one sided weakness or numbness, slurred speech, or any other symptoms concerning to you. ___________________________________________   FINAL CLINICAL IMPRESSION(S) / ED DIAGNOSES   Final diagnoses:  Fall, initial encounter  Laceration of scalp, initial encounter  Near syncope              Note: This dictation was prepared with Diplomatic Services operational officer dictation. Any transcriptional errors that result from this process are unintentional    Lisa Roca, MD 04/24/17 [place], MD 04/24/17 1032

## 2017-04-24 NOTE — Discharge Instructions (Signed)
You were evaluated for nearly passing out, dizziness, and although no certain cause was found, and your exam and evaluation are reassuring in the emergency department today.  Glue was applied to your scalp laceration. The glue will peel off on its own in 10-14 days.  [facility] triage department immediately for any chest pain, palpitations, no worsening dizziness or passing out, one sided weakness or numbness, slurred speech, or any other symptoms concerning to you.

## 2017-04-25 DIAGNOSIS — I639 Cerebral infarction, unspecified: Principal | ICD-10-CM

## 2017-04-25 LAB — CBC
HCT: 36 % (ref 35.0–47.0)
HEMOGLOBIN: 12.2 g/dL (ref 12.0–16.0)
MCH: 31.8 pg (ref 26.0–34.0)
MCHC: 33.9 g/dL (ref 32.0–36.0)
MCV: 93.7 fL (ref 80.0–100.0)
Platelets: 232 10*3/uL (ref 150–440)
RBC: 3.84 MIL/uL (ref 3.80–5.20)
RDW: 15.1 % — AB (ref 11.5–14.5)
WBC: 4.6 10*3/uL (ref 3.6–11.0)

## 2017-04-25 LAB — COMPREHENSIVE METABOLIC PANEL
ALK PHOS: 88 U/L (ref 38–126)
ALT: 26 U/L (ref 14–54)
ANION GAP: 5 (ref 5–15)
AST: 28 U/L (ref 15–41)
Albumin: 3.6 g/dL (ref 3.5–5.0)
BILIRUBIN TOTAL: 0.6 mg/dL (ref 0.3–1.2)
BUN: 11 mg/dL (ref 6–20)
CALCIUM: 8.6 mg/dL — AB (ref 8.9–10.3)
CO2: 25 mmol/L (ref 22–32)
CREATININE: 0.58 mg/dL (ref 0.44–1.00)
Chloride: 111 mmol/L (ref 101–111)
GLUCOSE: 95 mg/dL (ref 65–99)
Potassium: 3.7 mmol/L (ref 3.5–5.1)
Sodium: 141 mmol/L (ref 135–145)
TOTAL PROTEIN: 6.4 g/dL — AB (ref 6.5–8.1)

## 2017-04-25 LAB — GLUCOSE, CAPILLARY: GLUCOSE-CAPILLARY: 84 mg/dL (ref 65–99)

## 2017-04-25 NOTE — NC FL2 (Signed)
[place] LEVEL OF CARE SCREENING TOOL     IDENTIFICATION  Patient Name: Donna Rhodes Birthdate: July 14, 1946 Sex: female Admission Date (Current Location): 04/24/2017  [place] and [place] Number:  Engineering geologist and Address:  [facility], [address] [place], [place] [address]      Provider Number: [id number]  Attending Physician Name and Address:  Fritzi Mandes, MD  Relative Name and Phone Number:       Current Level of Care: Hospital Recommended Level of Care: [place] Prior Approval Number:    Date Approved/Denied:   PASRR Number: [id number]  Discharge Plan: SNF    Current Diagnoses: Patient Active Problem List   Diagnosis Date Noted  . CVA (cerebral vascular accident) ([place]) 04/24/2017  . LGSIL Pap smear of vagina 12/15/2016  . Anal skin tag 02/02/2016  . Inflammatory arthritis 01/19/2016  . Right knee pain 01/19/2016  . Genital herpes 10/20/2015  . GERD (gastroesophageal reflux disease) 10/20/2015  . Status post vaginal hysterectomy 10/20/2015  . Health care maintenance 05/30/2015  . LLQ pain 03/31/2014  . Menopausal symptoms 03/31/2014  . Hypercholesterolemia 12/31/2012  . Hypothyroidism 12/29/2012  . Osteoarthritis 12/29/2012  . PULMONARY NODULE 01/02/2008  . Depression 12/30/2007  . Essential hypertension 12/30/2007    Orientation RESPIRATION BLADDER Height & Weight     Self, Time, Situation  Normal Continent Weight: 134 lb (60.8 kg) Height:  5\' 4"  (162.6 cm)  BEHAVIORAL SYMPTOMS/MOOD NEUROLOGICAL BOWEL NUTRITION STATUS      Continent Diet (Heart Healthy)  AMBULATORY STATUS COMMUNICATION OF NEEDS Skin   Extensive Assist Verbally Normal                       Personal Care Assistance Level of Assistance  Bathing, Feeding, Dressing Bathing Assistance: Limited assistance Feeding assistance: Independent Dressing Assistance: Limited assistance     Functional Limitations Info              SPECIAL CARE FACTORS FREQUENCY  PT (By licensed PT)     PT Frequency: Up to 5X per day, 5 days per week              Contractures Contractures Info: Present    Additional Factors Info  Allergies   Allergies Info: Amoxicillin, Avelox Moxifloxacin Hcl In Nacl, Azithromycin, Codeine, Estroven Weight Management Nutritional Supplements, Moxifloxacin, Naproxen           Current Medications (04/25/2017):  This is the current hospital active medication list Current Facility-Administered Medications  Medication Dose Route Frequency Provider Last Rate Last Dose  . acetaminophen (TYLENOL) tablet 650 mg  650 mg Oral Q6H PRN Idelle Crouch, MD       Or  . acetaminophen (TYLENOL) suppository 650 mg  650 mg Rectal Q6H PRN Idelle Crouch, MD      . amLODipine (NORVASC) tablet 5 mg  5 mg Oral Daily Idelle Crouch, MD   5 mg at 04/25/17 0959  . bisacodyl (DULCOLAX) suppository 10 mg  10 mg Rectal Daily PRN Idelle Crouch, MD      . calcium-vitamin D (OSCAL WITH D) 500-200 MG-UNIT per tablet 1 tablet  1 tablet Oral Daily Idelle Crouch, MD      . clopidogrel (PLAVIX) tablet 75 mg  75 mg Oral Daily Idelle Crouch, MD   75 mg at 04/25/17 0959  . docusate sodium (COLACE) capsule 100 mg  100 mg Oral BID Idelle Crouch, MD  100 mg at 04/25/17 1000  . enoxaparin (LOVENOX) injection 40 mg  40 mg Subcutaneous Q24H Idelle Crouch, MD   40 mg at 04/24/17 2132  . folic acid (FOLVITE) tablet 1 mg  1 mg Oral Daily Idelle Crouch, MD   1 mg at 04/25/17 0959  . levothyroxine (SYNTHROID, LEVOTHROID) tablet 50 mcg  50 mcg Oral Daily Idelle Crouch, MD   50 mcg at 04/25/17 0612  . ondansetron (ZOFRAN) tablet 4 mg  4 mg Oral Q6H PRN Idelle Crouch, MD       Or  . ondansetron [facility]) injection 4 mg  4 mg Intravenous Q6H PRN Idelle Crouch, MD      . pantoprazole (PROTONIX) EC tablet 40 mg  40 mg Oral Daily Idelle Crouch, MD   40 mg at 04/25/17 0959  . sodium chloride  flush (NS) 0.9 % injection 3 mL  3 mL Intravenous Q12H Idelle Crouch, MD   3 mL at 04/25/17 1000     Discharge Medications: Please see discharge summary for a list of discharge medications.  Relevant Imaging Results:  Relevant Lab Results:   Additional Information SS# [id number]  Zettie Pho, LCSW

## 2017-04-25 NOTE — Clinical Social Work Note (Signed)
Clinical Social Work Assessment  Patient Details  Name: Donna Rhodes MRN: [id number] Date of Birth: 1946-01-07  Date of referral:  04/25/17               Reason for consult:  Facility Placement, Discharge Planning                Permission sought to share information with:  Family Supports, Customer service manager Permission granted to share information::  Yes, Verbal Permission Granted  Name::     Amy [phone] Manuela Schwartz and Almyra Free are pts other daughters ( Sister in [place] and brother) Remo Lipps and Jiles Harold [phone]  Agency::     Relationship::     Contact Information:     Housing/Transportation Living arrangements for the past 2 months:  Single Family Home Source of Information:  Adult Children Patient Interpreter Needed:  None Criminal Activity/Legal Involvement Pertinent to Current Situation/Hospitalization:    Significant Relationships:  Adult Children, Other Family Members, Community Support, [place] Lives with:  Self, Adult Children Do you feel safe going back to the place where you live?  Yes Need for family participation in patient care:  Yes (Comment)  Care giving concerns: Family concerned as pt fairly independent and ambulatory less than 36 hours ago and now has weakness and numbness   Facilities manager / plan:  LCSW introduced myself to patient and many of her family members today. Verbal consent was given to speak and complete assessment in the presence of family. Patient was very independant 36 hours ago and then came to [place] is a 71 y.o. female has a past medical history significant for HTN, [name], and thyroid disease now with acute onset left-sided weakness. Was in ER earlier today with weakness which resolved. CT and MRI negative at that time.  Pt Went home and developed worsening left-sided sx's. The [name]. This patient has good family support and one daughter does live with her ( patient remains  alone a fair bit at home)Patient is alert and oriented x4 and wears glasses, good hearing and slight slurred speech. Patient reports she feels week and tired. Awaiting PT and medical recommendations. Family reported that is patient is to DC its likely she would return home and have in home supports and PT/OT but will make final plans once medical work up completed.  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) PT Recommendations:  Not assessed at this time (Awaiting PT consult) Information / Referral to community resources:   (None requested)  Patient/Family's Response to care: All family members and patient have a good understanding  Patient/Family's Understanding of and Emotional Response to Diagnosis, Current Treatment, and Prognosis:  Patient understands and has excellent family supports  Emotional Assessment Appearance:  Appears stated age Attitude/Demeanor/Rapport:   (Polite) Affect (typically observed):  Accepting, Appropriate, Calm Orientation:  Oriented to Self, Oriented to Place, Oriented to  Time, Oriented to Situation Alcohol / Substance use:  Not Applicable Psych involvement (Current and /or in the community):  No (Comment)  Discharge Needs  Concerns to be addressed:  No discharge needs identified Readmission within the last 30 days:  No Current discharge risk:  None Barriers to Discharge:  Continued Medical Work up   Joana Reamer, LCSW 04/25/2017, 9:58 AM

## 2017-04-25 NOTE — Progress Notes (Signed)
[place] [facility] [place] NAME: Donna Rhodes    MR#:  [id number]  DATE OF BIRTH:  1946/02/04  SUBJECTIVE:  Patient presented with left upper extremity numbness and heaviness facial drool and left lower extremity weakness. Found to have acute CVA on MRI Able to swallow liquid per nurse  REVIEW OF SYSTEMS:   Review of Systems  Constitutional: Negative for chills, fever and weight loss.  HENT: Negative for ear discharge, ear pain and nosebleeds.   Eyes: Negative for blurred vision, pain and discharge.  Respiratory: Negative for sputum production, shortness of breath, wheezing and stridor.   Cardiovascular: Negative for chest pain, palpitations, orthopnea and PND.  Gastrointestinal: Negative for abdominal pain, diarrhea, nausea and vomiting.  Genitourinary: Negative for frequency and urgency.  Musculoskeletal: Negative for back pain and joint pain.  Neurological: Positive for sensory change, focal weakness and weakness. Negative for speech change.  Psychiatric/Behavioral: Negative for depression and hallucinations. The patient is not nervous/anxious.    Tolerating Diet:yes Tolerating PT: CIR  DRUG ALLERGIES:   Allergies  Allergen Reactions  . Amoxicillin Other (See Comments)    Irritated tongue  . Avelox [Moxifloxacin Hcl In Nacl]   . Azithromycin Other (See Comments)    Caused mouth and tongue to be sore  . Codeine   . Estroven Weight Management [Nutritional Supplements]     Mouth soreness  . Moxifloxacin Other (See Comments)  . Naproxen     VITALS:  Blood pressure 137/66, pulse 70, temperature 98.5 F (36.9 C), temperature source Oral, resp. rate 18, height 5\' 4"  (1.626 m), weight 60.8 kg (134 lb), last menstrual period 12/29/1981, SpO2 98 %.  PHYSICAL EXAMINATION:   Physical Exam  GENERAL:  71 y.o.-year-old patient lying in the bed with no acute distress.  EYES: Pupils equal, round, reactive to light and accommodation.  No scleral icterus. Extraocular muscles intact.  HEENT: Head atraumatic, normocephalic. Oropharynx and nasopharynx clear.  NECK:  Supple, no jugular venous distention. No thyroid enlargement, no tenderness.  LUNGS: Normal breath sounds bilaterally, no wheezing, rales, rhonchi. No use of accessory muscles of respiration.  CARDIOVASCULAR: S1, S2 normal. No murmurs, rubs, or gallops.  ABDOMEN: Soft, nontender, nondistended. Bowel sounds present. No organomegaly or mass.  EXTREMITIES: No cyanosis, clubbing or edema b/l.    NEUROLOGIC: Left upper extremity 1/5 and left lower extremity 3+ over 5 weakness. Reflexes absent left upper and lower extremity. Increased sensation left upper and lower extremity. Mild left facial droop  PSYCHIATRIC:  patient is alert and oriented x 3.  SKIN: No obvious rash, lesion, or ulcer.   LABORATORY PANEL:  CBC  Recent Labs Lab 04/25/17 0443  WBC 4.6  HGB 12.2  HCT 36.0  PLT 232    Chemistries   Recent Labs Lab 04/25/17 0443  NA 141  K 3.7  CL 111  CO2 25  GLUCOSE 95  BUN 11  CREATININE 0.58  CALCIUM 8.6*  AST 28  ALT 26  ALKPHOS 88  BILITOT 0.6   Cardiac Enzymes  Recent Labs Lab 04/24/17 0608  TROPONINI <0.03   RADIOLOGY:  Ct Head Wo Contrast  Result Date: 04/24/2017 CLINICAL DATA:  Left-sided weakness beginning chest prior to admission. EXAM: CT HEAD WITHOUT CONTRAST TECHNIQUE: Contiguous axial images were obtained from the base of the skull through the vertex without intravenous contrast. COMPARISON:  MRI same day. FINDINGS: Brain: No apparent change since the MRI of 8 hours ago. No sign of acute infarction, mass  lesion, hemorrhage, hydrocephalus or extra-axial collection. Dilated perivascular spaces at the base of the brain on the right. Minimal small vessel change of the hemispheric white matter. Vascular: There is atherosclerotic calcification of the major vessels at the base of the brain. Skull: Negative Sinuses/Orbits: Clear/normal  Other: None IMPRESSION: No acute finding by CT. No appreciable change since the MRI of 8 hours ago. No sign of acute infarction or hyperdense vessel. Aspects 10. These results were called by telephone at the time of interpretation on 04/24/2017 at 3:08 pm to Dr. Lisa Roca , who verbally acknowledged these results. Electronically Signed   By: Nelson Chimes M.D.   On: 04/24/2017 15:08   Ct Head Wo Contrast  Result Date: 04/24/2017 CLINICAL DATA:  Two falls this morning, head injury. No loss of consciousness. History of hypertension. EXAM: CT HEAD WITHOUT CONTRAST TECHNIQUE: Contiguous axial images were obtained from the base of the skull through the vertex without intravenous contrast. COMPARISON:  None. FINDINGS: BRAIN: No intraparenchymal hemorrhage, mass effect nor midline shift. The ventricles and sulci are normal for age. Patchy supratentorial white matter hypodensities within normal range for patient's age, though non-specific are most compatible with chronic small vessel ischemic disease. No acute large vascular territory infarcts. No abnormal extra-axial fluid collections. Basal cisterns are patent. VASCULAR: Moderate calcific atherosclerosis of the carotid siphons. SKULL: No skull fracture. Moderate to severe temporomandibular osteoarthrosis. No significant scalp soft tissue swelling. SINUSES/ORBITS: The mastoid air-cells and included paranasal sinuses are well-aerated.The included ocular globes and orbital contents are non-suspicious. OTHER: None. IMPRESSION: Negative CT HEAD for age. Electronically Signed   By: Elon Alas M.D.   On: 04/24/2017 06:48   Mr Angiogram Head Wo Contrast  Result Date: 04/24/2017 CLINICAL DATA:  Left arm and leg weakness which has developed today, since the previous imaging. EXAM: MRI HEAD WITHOUT CONTRAST MRA HEAD WITHOUT CONTRAST TECHNIQUE: Multiplanar, multiecho pulse sequences of the brain and surrounding structures were obtained without intravenous contrast.  Angiographic images of the head were obtained using MRA technique without contrast. COMPARISON:  CT and MRI same day. FINDINGS: MRI HEAD FINDINGS Brain: Diffusion imaging shows acute infarction in the left basal ganglia/ posterior limb internal capsule. Mild chronic small-vessel ischemic changes elsewhere within the cerebral hemispheric white matter are unchanged. No evidence of mass effect or hemorrhage. No hydrocephalus. No extra-axial collection. No mass lesion. Vascular: Major vessels at the base of the brain show flow. Skull and upper cervical spine: Negative Sinuses/Orbits: Clear/normal Other: None significant MRA HEAD FINDINGS Both internal carotid arteries are widely patent through the skullbase and siphon regions. There is mild atherosclerotic irregularity in the carotid siphon regions but no flow-limiting stenosis. The anterior and middle cerebral vessels are patent without stenosis, aneurysm or vascular malformation. Hypoplastic A1 segment on the left. Azygos anterior cerebral artery as a normal variant. Left PCA takes fetal origin from the left carotid. Both vertebral arteries widely patent to the basilar. No basilar stenosis. Posterior circulation branch vessels appear normal. 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 No evidence of hemorrhage or mass effect. Negative intracranial MR angiography of the large and medium size vessels. No occlusion or correctable proximal stenosis. Some atherosclerotic irregularity in the carotid siphon regions without stenosis. Anatomic variations of hypoplastic A1 segment on the left, azygos anterior cerebral artery and fetal origin left PCA. Electronically Signed   By: Nelson Chimes M.D.   On: 04/24/2017 18:12   Mr Angiogram Neck W Or Wo Contrast  Result  Date: 04/24/2017 CLINICAL DATA:  Left-sided weakness. Acute stroke right basal ganglia posterior limb internal capsule EXAM: MRA NECK WITHOUT AND WITH CONTRAST  TECHNIQUE: Multiplanar and multiecho pulse sequences of the neck were obtained without and with intravenous contrast. Angiographic images of the neck were obtained using MRA technique without and with intravenous contrast. CONTRAST:  4mL MULTIHANCE GADOBENATE DIMEGLUMINE 529 MG/ML IV SOLN COMPARISON:  MRI same day FINDINGS: Branching pattern of the brachiocephalic vessels from the arch is normal. No origin stenoses. Both common carotid arteries are widely patent to the bifurcations. Both carotid bifurcations appear normal without stenosis or irregularity. Cervical internal carotid arteries are normal. Both vertebral artery origins are widely patent. The vertebral arteries are approximately equal in size an widely patent through the cervical region to the basilar. IMPRESSION: Normal MR angiography of the neck vessels. Electronically Signed   By: Nelson Chimes M.D.   On: 04/24/2017 18:28   Mr Brain Wo Contrast (neuro Protocol)  Result Date: 04/24/2017 CLINICAL DATA:  Left arm and leg weakness which has developed today, since the previous imaging. EXAM: MRI HEAD WITHOUT CONTRAST MRA HEAD WITHOUT CONTRAST TECHNIQUE: Multiplanar, multiecho pulse sequences of the brain and surrounding structures were obtained without intravenous contrast. Angiographic images of the head were obtained using MRA technique without contrast. COMPARISON:  CT and MRI same day. FINDINGS: MRI HEAD FINDINGS Brain: Diffusion imaging shows acute infarction in the left basal ganglia/ posterior limb internal capsule. Mild chronic small-vessel ischemic changes elsewhere within the cerebral hemispheric white matter are unchanged. No evidence of mass effect or hemorrhage. No hydrocephalus. No extra-axial collection. No mass lesion. Vascular: Major vessels at the base of the brain show flow. Skull and upper cervical spine: Negative Sinuses/Orbits: Clear/normal Other: None significant MRA HEAD FINDINGS Both internal carotid arteries are widely patent  through the skullbase and siphon regions. There is mild atherosclerotic irregularity in the carotid siphon regions but no flow-limiting stenosis. The anterior and middle cerebral vessels are patent without stenosis, aneurysm or vascular malformation. Hypoplastic A1 segment on the left. Azygos anterior cerebral artery as a normal variant. Left PCA takes fetal origin from the left carotid. Both vertebral arteries widely patent to the basilar. No basilar stenosis. Posterior circulation branch vessels appear normal. 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 No evidence of hemorrhage or mass effect. Negative intracranial MR angiography of the large and medium size vessels. No occlusion or correctable proximal stenosis. Some atherosclerotic irregularity in the carotid siphon regions without stenosis. Anatomic variations of hypoplastic A1 segment on the left, azygos anterior cerebral artery and fetal origin left PCA. Electronically Signed   By: Nelson Chimes M.D.   On: 04/24/2017 18:12   Mr Brain Wo Contrast (neuro Protocol)  Result Date: 04/24/2017 CLINICAL DATA:  Multiple falls. EXAM: MRI HEAD WITHOUT CONTRAST TECHNIQUE: Multiplanar, multiecho pulse sequences of the brain and surrounding structures were obtained without intravenous contrast. COMPARISON:  CT head earlier today. FINDINGS: Brain: No evidence for acute infarction, hemorrhage, mass lesion, hydrocephalus, or extra-axial fluid. Normal for age cerebral volume. Mild subcortical and periventricular T2 and FLAIR hyperintensities, likely chronic microvascular ischemic change. Vascular: Flow voids are maintained throughout the carotid, basilar, and vertebral arteries. There are no areas of chronic hemorrhage. Skull and upper cervical spine: Unremarkable visualized calvarium, skullbase, and cervical vertebrae. Pituitary, pineal, cerebellar tonsils unremarkable. Cervical spondylosis with central protrusion at C4-C5.  Unknown degree of canal stenosis. Sinuses/Orbits: No orbital masses or proptosis. Globes appear symmetric. Sinuses appear well aerated,  without evidence for air-fluid level. Other: No nasopharyngeal pathology or mastoid fluid. Scalp and other visualized extracranial soft tissues grossly unremarkable. IMPRESSION: No acute intracranial findings. Mild subcortical and periventricular T2 and FLAIR hyperintensities, likely chronic microvascular ischemic change. Cervical spondylosis with central protrusion at C4-5, unknown degree of canal stenosis. Electronically Signed   By: Staci Righter M.D.   On: 04/24/2017 09:16   ASSESSMENT AND PLAN:  *ANYLAH SCHEIB is a 71 y.o. female has a past medical history significant for HTN, [name], [name], [name]  1. 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   Essential hypertension allow permissive hypertension    3. Hypothyroidism Synthroid  4.   Hypercholesterolemia  Statins  Discussed with patient's daughter in the room. Questions answered  Case discussed with Care Management/Social Worker. Management plans discussed with the patient, family and they are in agreement.  CODE STATUS: full  DVT Prophylaxis: lovenox  TOTAL TIME TAKING CARE OF THIS PATIENT: *30* minutes.  >50% time spent on counselling and coordination of care  POSSIBLE D/C IN **1-2* DAYS, DEPENDING ON CLINICAL CONDITION.  Note: This dictation was prepared with Dragon dictation along with smaller phrase technology. Any transcriptional errors that result  from this process are unintentional.  Denesia Donelan M.D on 04/25/2017 at 1:10 PM  Between 7am to 6pm - Pager - [phone]  After 6pm go to [email or web address] - password [email or web address] [facility]  Office  [phone]  CC: Primary care physician; Einar Pheasant, MD

## 2017-04-25 NOTE — Progress Notes (Signed)
LCSW met with patient and introduced myself and completed assessment question. Patient has given verbal consent to speak to family members. Patient and family stated if patient is to be discharged she is likely to return home with home health supports rather that a SNF. They will make a decision once a treatment plan is created.  During assessment LCSW was introduced to [place] who assisted with providing information. The family reports that Dr Posey Pronto may send patient to [facility] in consultation with [facility] I will only complete assessment. PT and OT consult is pending.  [employer] LCSW [phone]

## 2017-04-25 NOTE — Progress Notes (Signed)
Physical Therapy Evaluation Patient Details Name: Donna Rhodes MRN: [id number] DOB: [birth year] Today's Date: 04/25/2017   History of Present Illness  Patient is a 71 y.o. 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 Patient was at ED with negative CT/MRI earlier in day and returned 4 hours later with change in status. PMH includes HTN, hypercholestolemia, hypothyroidism, depression, and GERD.  Clinical Impression  Patient is a pleasant female admitted for above listed reasons. Patient previously independent in all aspects prior to admission with significant weakness in L UE>LE, detailed below. Patient requires moderate assistance into standing but is unable to perform dynamic balance activities without maximal support. Maximum assist required for squat pivot transfer to bedside commode on L. Patient seems to be very motivated, as she was able to complete dynamic standing balance exercises, as well as UE exercises with PT. Patient will benefit from skilled, intensive rehabilitation to return to PLOF and f/u at inpatient rehabilitation upon d/c when medically ready.    Follow Up Recommendations CIR    Equipment Recommendations       Recommendations for Other Services OT consult     Precautions / Restrictions Precautions Precautions: Fall Restrictions Weight Bearing Restrictions: No      Mobility  Bed Mobility Overal bed mobility: Needs Assistance Bed Mobility: Sit to Supine       Sit to supine: Min assist   General bed mobility comments: Patient required minimal assistance with L LE onto bed. Patient able to bridge through R LE and scoot to [facility] with minimal assistance.  Transfers Overall transfer level: Needs assistance Equipment used: Left platform walker Transfers: Sit to/from [employer] Transfers Sit to Stand: Mod assist   Squat pivot transfers: Max assist     General transfer comment:  Patient moves from sit to stand and stand to sit with moderate assistance to transfer and remain balanced with dynamic weightshifting tasks. Patient able to squat pivot to R with maximal assistance, blocking L LE.  Ambulation/Gait                Stairs            Wheelchair Mobility    Modified Rankin (Stroke Patients Only)       Balance Overall balance assessment: Needs assistance Sitting-balance support: Single extremity supported;Feet supported Sitting balance-Leahy Scale: Good     Standing balance support: Single extremity supported Standing balance-Leahy Scale: Poor                               Pertinent Vitals/Pain Pain Assessment: No/denies pain    Home Living Family/patient expects to be discharged to:: Private residence Living Arrangements: Alone Available Help at Discharge: Family;Available PRN/intermittently Type of Home: House Home Access: Level entry     Home Layout: One level Home Equipment: None      Prior Function Level of Independence: Independent         Comments: Patient previously independent.     Hand Dominance   Dominant Hand: Right    Extremity/Trunk Assessment   Upper Extremity Assessment Upper Extremity Assessment: Generalized weakness;LUE deficits/detail LUE Deficits / Details: Sensation intact and symmetrical; Trace contraction in L thumb, otherwise no grip strength; 2-/5 wrist flex./ext. and elbow flex/ext.; shoulder flex./abd. 2/5    Lower Extremity Assessment Lower Extremity Assessment: LLE deficits/detail;Generalized weakness LLE Deficits / Details: Decreased LT sensation L S1 dermatome, otherwise intact and  symmetrical; 4-/5 df/pf, 3+/5 knee extension, 4-/5 knee flexion/hip flexion        Communication   Communication: No difficulties  Cognition Arousal/Alertness: Awake/alert Behavior During Therapy: WFL for tasks assessed/performed;Flat affect Overall Cognitive Status: Within Functional  Limits for tasks assessed                                        General Comments      Exercises Other Exercises Other Exercises: Standing balance lateral weightshifting x10, standing aarom marching x10 Other Exercises: AAROM elbow flexion/extension, supination/pronation, wrist flexion/extension, gripping   Assessment/Plan    PT Assessment Patient needs continued PT services  PT Problem List Decreased strength;Decreased range of motion;Decreased activity tolerance;Decreased balance;Decreased mobility;Decreased knowledge of use of DME;Decreased safety awareness       PT Treatment Interventions DME instruction;Gait training;Stair training;Functional mobility training;Therapeutic activities;Therapeutic exercise;Balance training;Neuromuscular re-education;Patient/family education    PT Goals (Current goals can be found in the Care Plan section)  Acute Rehab PT Goals Patient Stated Goal: "To get stronger" PT Goal Formulation: With patient/family Time For Goal Achievement: 05/09/17 Potential to Achieve Goals: Good    Frequency 7X/week   Barriers to discharge Inaccessible home environment      Co-evaluation               End of Session Equipment Utilized During Treatment: Gait belt Activity Tolerance: Patient tolerated treatment well;Patient limited by fatigue Patient left: in bed;with call bell/phone within reach;with bed alarm set;with family/visitor present   PT Visit Diagnosis: Muscle weakness (generalized) (M62.81);Difficulty in walking, not elsewhere classified (R26.2);Hemiplegia and 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

## 2017-04-25 NOTE — Progress Notes (Signed)
LCSW met with patients family and collected data to complete assessment. As per her family she will likely return home with in home supports if required. LCSW will go and meet and greet with patient.  Tradarius Reinwald LCSW

## 2017-04-25 NOTE — Consult Note (Signed)
Referring Physician: Posey Pronto    Chief Complaint: Left sided weakness  HPI: Donna Rhodes is an 71 y.o. LH female who presented to the ED on yesterday with left sided weakness and slurred speech.  Work was unremarkable, including head CT and MRI.  Patient reports that her symptoms did not completely resolve.  She returned home and had further worsening of her left sided weakness.  Patient represented at that time.  Initial NIHSS of 5.  Date last known well: Date: 04/23/2017 Time last known well: Time: 23:00 tPA Given: No: Outside treatment window  Past Medical History:  Diagnosis Date  . Atrophic vaginitis   . Dysplasia of vagina    vin 1 - vulva and vaginal cuff  . GERD (gastroesophageal reflux disease)   . HCV infection   . HSV (herpes simplex virus) infection   . Hypercholesterolemia   . Hypertension   . Hypothyroidism   . Lung nodule    stable  . Menopausal state   . Osteoarthritis    hand involvement, cervical disc disease  . Osteoporosis   . Pelvic pain in female   . Thyroid nodule   . Vaginal Pap smear, abnormal    lgsil    Past Surgical History:  Procedure Laterality Date  . ADENOIDECTOMY    . APPENDECTOMY  1965  . [place]   left  . CHOLECYSTECTOMY    . [place]  . laser vein surgery    . MANDIBLE RECONSTRUCTION  1992  . TOENAIL EXCISION  1998   several  . [place]  . VAGINAL HYSTERECTOMY  1983   als0- anterior/posterior colporrhaphy  . VAGINAL WOUND CLOSURE / REPAIR  1999, 2000  . VAGINAL WOUND CLOSURE / REPAIR      Family History  Problem Relation Age of Onset  . Hypertension Father   . Osteoarthritis Mother   . Arthritis/Rheumatoid Sister   . Breast cancer      paternal side  . Breast cancer Paternal Aunt   . Breast cancer Cousin     maternal  . Uterine cancer Maternal Grandmother   . Ovarian cancer Neg Hx   . Heart disease Neg Hx   . Diabetes Neg Hx   . Colon cancer Neg Hx    Social History:  reports  that she quit smoking about 37 years ago. She has never used smokeless tobacco. She reports that she does not drink alcohol or use drugs.  Allergies:  Allergies  Allergen Reactions  . Amoxicillin Other (See Comments)    Irritated tongue  . Avelox [Moxifloxacin Hcl In Nacl]   . Azithromycin Other (See Comments)    Caused mouth and tongue to be sore  . Codeine   . Estroven Weight Management [Nutritional Supplements]     Mouth soreness  . Moxifloxacin Other (See Comments)  . Naproxen     Medications:  I have reviewed the patient's current medications. Prior to Admission:  Prescriptions Prior to Admission  Medication Sig Dispense Refill Last Dose  . amLODipine (NORVASC) 5 MG tablet Take 1 tablet (5 mg total) by mouth daily. 90 tablet 1 04/24/2017 at am  . esomeprazole (NEXIUM) 40 MG capsule Take 1 capsule (40 mg total) by mouth daily. 30 capsule 2 04/23/2017  . estradiol (ESTRACE VAGINAL) 0.1 MG/GM vaginal cream Place 1 Applicatorful vaginally at bedtime.   prn at prn  . folic acid (FOLVITE) 1 MG tablet Take 1 mg by mouth daily.   04/23/2017 at  pm  . levothyroxine (SYNTHROID, LEVOTHROID) 50 MCG tablet Take 1 tablet (50 mcg total) by mouth daily. 90 tablet 0 04/24/2017 at am  . methotrexate 50 MG/2ML injection Inject 0.83mL under the skin once a week.   04/21/2017  . acyclovir (ZOVIRAX) 200 MG capsule Take 1 capsule (200 mg total) by mouth 2 (two) times daily. (Patient not taking: Reported on 04/24/2017) 180 capsule 3 Not Taking at Unknown time  . calcium-vitamin D (OSCAL WITH D) 250-125 MG-UNIT tablet Take 1 tablet by mouth daily.   Not Taking at Unknown time  . estradiol (ESTRACE) 1 MG tablet Take 0.5 tablets (0.5 mg total) by mouth daily. (Patient not taking: Reported on 04/24/2017) 90 tablet 3 Not Taking at Unknown time   Scheduled: . amLODipine  5 mg Oral Daily  . calcium-vitamin D  1 tablet Oral Daily  . clopidogrel  75 mg Oral Daily  . docusate sodium  100 mg Oral BID  . enoxaparin  (LOVENOX) injection  40 mg Subcutaneous Q24H  . folic acid  1 mg Oral Daily  . levothyroxine  50 mcg Oral Daily  . pantoprazole  40 mg Oral Daily  . sodium chloride flush  3 mL Intravenous Q12H    ROS: History obtained from the patient  General ROS: negative for - chills, fatigue, fever, night sweats, weight gain or weight loss Psychological ROS: negative for - behavioral disorder, hallucinations, memory difficulties, mood swings or suicidal ideation Ophthalmic ROS: negative for - blurry vision, double vision, eye pain or loss of vision ENT ROS: negative for - epistaxis, nasal discharge, oral lesions, sore throat, tinnitus or vertigo Allergy and Immunology ROS: negative for - hives or itchy/watery eyes Hematological and Lymphatic ROS: negative for - bleeding problems, bruising or swollen lymph nodes Endocrine ROS: negative for - galactorrhea, hair pattern changes, polydipsia/polyuria or temperature intolerance Respiratory ROS: negative for - cough, hemoptysis, shortness of breath or wheezing Cardiovascular ROS: negative for - chest pain, dyspnea on exertion, edema or irregular heartbeat Gastrointestinal ROS: negative for - abdominal pain, diarrhea, hematemesis, nausea/vomiting or stool incontinence Genito-Urinary ROS: negative for - dysuria, hematuria, incontinence or urinary frequency/urgency Musculoskeletal ROS: negative for - joint swelling or muscular weakness Neurological ROS: as noted in HPI Dermatological ROS: negative for rash and skin lesion changes  Physical Examination: Blood pressure 137/67, pulse 64, temperature 98 F (36.7 C), resp. rate 18, height 5\' 4"  (1.626 m), weight 60.8 kg (134 lb), last menstrual period 12/29/1981, SpO2 99 %.  HEENT-  Normocephalic, no lesions, without obvious abnormality.  Normal external eye and conjunctiva.  Normal TM's bilaterally.  Normal auditory canals and external ears. Normal external nose, mucus membranes and septum.  Normal  pharynx. Cardiovascular- S1, S2 normal, pulses palpable throughout   Lungs- chest clear, no wheezing, rales, normal symmetric air entry Abdomen- soft, non-tender; bowel sounds normal; no masses,  no organomegaly Extremities- no edema Lymph-no adenopathy palpable Musculoskeletal-no joint tenderness, deformity or swelling Skin-warm and dry, no hyperpigmentation, vitiligo, or suspicious lesions  Neurological Examination   Mental Status: Lethargic, oriented, thought content appropriate.  Speech fluent without evidence of aphasia but mildly dysarthric.  Able to follow 3 step commands without difficulty. Cranial Nerves: II: Discs flat bilaterally; Visual fields grossly normal, pupils equal, round, reactive to light and accommodation III,IV, VI: ptosis not present, extra-ocular motions intact bilaterally V,VII: left facial droop, facial light touch sensation normal bilaterally VIII: hearing normal bilaterally IX,X: gag reflex present XI: bilateral shoulder shrug XII: tongue deviation to the left Motor:  Right : Upper extremity   5/5    Left:     Upper extremity   3/5  Lower extremity   5/5     Lower extremity   4/5 Increased tone on the left Sensory: Pinprick and light touch intact throughout, bilaterally Deep Tendon Reflexes: 2+ and symmetric with absent AJ's bilaterally Plantars: Right: downgoing   Left: mute Cerebellar: Normal finger-to-nose and normal heel-to-shin testing on the right.  Impaired by weakness on the left Gait: not tested due to safety concerns    Laboratory Studies:  Basic Metabolic Panel:  Recent Labs Lab 04/24/17 0608 04/25/17 0443  NA 141 141  K 3.4* 3.7  CL 109 111  CO2 25 25  GLUCOSE 101* 95  BUN 17 11  CREATININE 0.80 0.58  CALCIUM 9.0 8.6*    Liver Function Tests:  Recent Labs Lab 04/25/17 0443  AST 28  ALT 26  ALKPHOS 88  BILITOT 0.6  PROT 6.4*  ALBUMIN 3.6   No results for input(s): LIPASE, AMYLASE in the last 168 hours. No results  for input(s): AMMONIA in the last 168 hours.  CBC:  Recent Labs Lab 04/24/17 0608 04/25/17 0443  WBC 5.6 4.6  HGB 12.6 12.2  HCT 37.2 36.0  MCV 92.3 93.7  PLT 248 232    Cardiac Enzymes:  Recent Labs Lab 04/24/17 0608  TROPONINI <0.03    BNP: Invalid input(s): POCBNP  CBG:  Recent Labs Lab 04/25/17 0723  GLUCAP 84    Microbiology: Results for orders placed or performed in visit on 05/25/13  Urine culture     Status: None   Collection Time: 05/25/13 10:25 AM  Result Value Ref Range Status   Colony Count NO GROWTH  Final   Organism ID, Bacteria NO GROWTH  Final    Coagulation Studies: No results for input(s): LABPROT, INR in the last 72 hours.  Urinalysis:  Recent Labs Lab 04/24/17 0608  COLORURINE STRAW*  LABSPEC 1.011  PHURINE 7.0  GLUCOSEU NEGATIVE  HGBUR NEGATIVE  BILIRUBINUR NEGATIVE  KETONESUR NEGATIVE  PROTEINUR NEGATIVE  NITRITE NEGATIVE  LEUKOCYTESUR NEGATIVE    Lipid Panel:    Component Value Date/Time   CHOL 203 (H) 08/05/2016 0910   TRIG 76.0 08/05/2016 0910   HDL 62.60 08/05/2016 0910   CHOLHDL 3 08/05/2016 0910   VLDL 15.2 08/05/2016 0910   LDLCALC 125 (H) 08/05/2016 0910    HgbA1C: No results found for: HGBA1C  Urine Drug Screen:  No results found for: LABOPIA, COCAINSCRNUR, LABBENZ, AMPHETMU, THCU, LABBARB  Alcohol Level: No results for input(s): ETH in the last 168 hours.  Other results: EKG: sinus rhythm  Imaging: Ct Head Wo Contrast  Result Date: 04/24/2017 CLINICAL DATA:  Left-sided weakness beginning chest prior to admission. EXAM: CT HEAD WITHOUT CONTRAST TECHNIQUE: Contiguous axial images were obtained from the base of the skull through the vertex without intravenous contrast. COMPARISON:  MRI same day. FINDINGS: Brain: No apparent change since the MRI of 8 hours ago. No sign of acute infarction, mass lesion, hemorrhage, hydrocephalus or extra-axial collection. Dilated perivascular spaces at the base of the brain  on the right. Minimal small vessel change of the hemispheric white matter. Vascular: There is atherosclerotic calcification of the major vessels at the base of the brain. Skull: Negative Sinuses/Orbits: Clear/normal Other: None IMPRESSION: No acute finding by CT. No appreciable change since the MRI of 8 hours ago. No sign of acute infarction or hyperdense vessel. Aspects 10. These results were called by telephone  at the time of interpretation on 04/24/2017 at 3:08 pm to Dr. Lisa Roca , who verbally acknowledged these results. Electronically Signed   By: Nelson Chimes M.D.   On: 04/24/2017 15:08   Ct Head Wo Contrast  Result Date: 04/24/2017 CLINICAL DATA:  Two falls this morning, head injury. No loss of consciousness. History of hypertension. EXAM: CT HEAD WITHOUT CONTRAST TECHNIQUE: Contiguous axial images were obtained from the base of the skull through the vertex without intravenous contrast. COMPARISON:  None. FINDINGS: BRAIN: No intraparenchymal hemorrhage, mass effect nor midline shift. The ventricles and sulci are normal for age. Patchy supratentorial white matter hypodensities within normal range for patient's age, though non-specific are most compatible with chronic small vessel ischemic disease. No acute large vascular territory infarcts. No abnormal extra-axial fluid collections. Basal cisterns are patent. VASCULAR: Moderate calcific atherosclerosis of the carotid siphons. SKULL: No skull fracture. Moderate to severe temporomandibular osteoarthrosis. No significant scalp soft tissue swelling. SINUSES/ORBITS: The mastoid air-cells and included paranasal sinuses are well-aerated.The included ocular globes and orbital contents are non-suspicious. OTHER: None. IMPRESSION: Negative CT HEAD for age. Electronically Signed   By: Elon Alas M.D.   On: 04/24/2017 06:48   Mr Angiogram Head Wo Contrast  Result Date: 04/24/2017 CLINICAL DATA:  Left arm and leg weakness which has developed today, since  the previous imaging. EXAM: MRI HEAD WITHOUT CONTRAST MRA HEAD WITHOUT CONTRAST TECHNIQUE: Multiplanar, multiecho pulse sequences of the brain and surrounding structures were obtained without intravenous contrast. Angiographic images of the head were obtained using MRA technique without contrast. COMPARISON:  CT and MRI same day. FINDINGS: MRI HEAD FINDINGS Brain: Diffusion imaging shows acute infarction in the left basal ganglia/ posterior limb internal capsule. Mild chronic small-vessel ischemic changes elsewhere within the cerebral hemispheric white matter are unchanged. No evidence of mass effect or hemorrhage. No hydrocephalus. No extra-axial collection. No mass lesion. Vascular: Major vessels at the base of the brain show flow. Skull and upper cervical spine: Negative Sinuses/Orbits: Clear/normal Other: None significant MRA HEAD FINDINGS Both internal carotid arteries are widely patent through the skullbase and siphon regions. There is mild atherosclerotic irregularity in the carotid siphon regions but no flow-limiting stenosis. The anterior and middle cerebral vessels are patent without stenosis, aneurysm or vascular malformation. Hypoplastic A1 segment on the left. Azygos anterior cerebral artery as a normal variant. Left PCA takes fetal origin from the left carotid. Both vertebral arteries widely patent to the basilar. No basilar stenosis. Posterior circulation branch vessels appear normal. 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 No evidence of hemorrhage or mass effect. Negative intracranial MR angiography of the large and medium size vessels. No occlusion or correctable proximal stenosis. Some atherosclerotic irregularity in the carotid siphon regions without stenosis. Anatomic variations of hypoplastic A1 segment on the left, azygos anterior cerebral artery and fetal origin left PCA. Electronically Signed   By: Nelson Chimes M.D.   On: 04/24/2017 18:12    Mr Angiogram Neck W Or Wo Contrast  Result Date: 04/24/2017 CLINICAL DATA:  Left-sided weakness. Acute stroke right basal ganglia posterior limb internal capsule EXAM: MRA NECK WITHOUT AND WITH CONTRAST TECHNIQUE: Multiplanar and multiecho pulse sequences of the neck were obtained without and with intravenous contrast. Angiographic images of the neck were obtained using MRA technique without and with intravenous contrast. CONTRAST:  19mL MULTIHANCE GADOBENATE DIMEGLUMINE 529 MG/ML IV SOLN COMPARISON:  MRI same day FINDINGS: Branching pattern of the brachiocephalic vessels from the arch is  normal. No origin stenoses. Both common carotid arteries are widely patent to the bifurcations. Both carotid bifurcations appear normal without stenosis or irregularity. Cervical internal carotid arteries are normal. Both vertebral artery origins are widely patent. The vertebral arteries are approximately equal in size an widely patent through the cervical region to the basilar. IMPRESSION: Normal MR angiography of the neck vessels. Electronically Signed   By: Nelson Chimes M.D.   On: 04/24/2017 18:28   Mr Brain Wo Contrast (neuro Protocol)  Result Date: 04/24/2017 CLINICAL DATA:  Left arm and leg weakness which has developed today, since the previous imaging. EXAM: MRI HEAD WITHOUT CONTRAST MRA HEAD WITHOUT CONTRAST TECHNIQUE: Multiplanar, multiecho pulse sequences of the brain and surrounding structures were obtained without intravenous contrast. Angiographic images of the head were obtained using MRA technique without contrast. COMPARISON:  CT and MRI same day. FINDINGS: MRI HEAD FINDINGS Brain: Diffusion imaging shows acute infarction in the left basal ganglia/ posterior limb internal capsule. Mild chronic small-vessel ischemic changes elsewhere within the cerebral hemispheric white matter are unchanged. No evidence of mass effect or hemorrhage. No hydrocephalus. No extra-axial collection. No mass lesion. Vascular:  Major vessels at the base of the brain show flow. Skull and upper cervical spine: Negative Sinuses/Orbits: Clear/normal Other: None significant MRA HEAD FINDINGS Both internal carotid arteries are widely patent through the skullbase and siphon regions. There is mild atherosclerotic irregularity in the carotid siphon regions but no flow-limiting stenosis. The anterior and middle cerebral vessels are patent without stenosis, aneurysm or vascular malformation. Hypoplastic A1 segment on the left. Azygos anterior cerebral artery as a normal variant. Left PCA takes fetal origin from the left carotid. Both vertebral arteries widely patent to the basilar. No basilar stenosis. Posterior circulation branch vessels appear normal. 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 No evidence of hemorrhage or mass effect. Negative intracranial MR angiography of the large and medium size vessels. No occlusion or correctable proximal stenosis. Some atherosclerotic irregularity in the carotid siphon regions without stenosis. Anatomic variations of hypoplastic A1 segment on the left, azygos anterior cerebral artery and fetal origin left PCA. Electronically Signed   By: Nelson Chimes M.D.   On: 04/24/2017 18:12   Mr Brain Wo Contrast (neuro Protocol)  Result Date: 04/24/2017 CLINICAL DATA:  Multiple falls. EXAM: MRI HEAD WITHOUT CONTRAST TECHNIQUE: Multiplanar, multiecho pulse sequences of the brain and surrounding structures were obtained without intravenous contrast. COMPARISON:  CT head earlier today. FINDINGS: Brain: No evidence for acute infarction, hemorrhage, mass lesion, hydrocephalus, or extra-axial fluid. Normal for age cerebral volume. Mild subcortical and periventricular T2 and FLAIR hyperintensities, likely chronic microvascular ischemic change. Vascular: Flow voids are maintained throughout the carotid, basilar, and vertebral arteries. There are no areas of chronic hemorrhage.  Skull and upper cervical spine: Unremarkable visualized calvarium, skullbase, and cervical vertebrae. Pituitary, pineal, cerebellar tonsils unremarkable. Cervical spondylosis with central protrusion at C4-C5. Unknown degree of canal stenosis. Sinuses/Orbits: No orbital masses or proptosis. Globes appear symmetric. Sinuses appear well aerated, without evidence for air-fluid level. Other: No nasopharyngeal pathology or mastoid fluid. Scalp and other visualized extracranial soft tissues grossly unremarkable. IMPRESSION: No acute intracranial findings. Mild subcortical and periventricular T2 and FLAIR hyperintensities, likely chronic microvascular ischemic change. Cervical spondylosis with central protrusion at C4-5, unknown degree of canal stenosis. Electronically Signed   By: Staci Righter M.D.   On: 04/24/2017 09:16    Assessment: 71 y.o. female presenting with left sided weakness.  MRI of the  brain reviewed and shows a right acute basal ganglial infarct.  Likely secondary to small vessel disease.  Some atherosclerotic disease noted on MRA.  Patient on no antiplatelet therapy at home.  Further work up recommended.   Stroke Risk Factors - hyperlipidemia and hypertension  Plan: 1. HgbA1c, fasting lipid panel 2. PT consult, OT consult, Speech consult 3. Echocardiogram 4. Prophylactic therapy-ASA 81mg , Plavix 75mg  daily 5. Telemetry monitoring 6. Frequent neuro checks    Alexis Goodell, MD Neurology [phone] 04/25/2017, 8:27 AM

## 2017-04-25 NOTE — Care Management Note (Addendum)
Case Management Note  Patient Details  Name: Donna Rhodes MRN: [id number] Date of Birth: 1946/04/10  Subjective/Objective:     Dr Fritzi Mandes reports that Donna Rhodes is positive for a CVA, and that the current discharge plan is for Rehab.  [facility] PT is recommending CIR. Weekend Education officer, museum was updated. At this point am uncertain whether Donna Rhodes is able to participate in 3 hour sessions of intense PT daily at CIR. Also, a CIR recommendation from another disciple is required, either Speech or OT.               Action/Plan:   Expected Discharge Date:                  Expected Discharge Plan:     In-House Referral:     Discharge planning Services     Post Acute Care Choice:    Choice offered to:     DME Arranged:    DME Agency:     HH Arranged:    HH Agency:     Status of Service:     If discussed at [employer] of Stay Meetings, dates discussed:    Additional Comments:  Giulia Hickey A, RN 04/25/2017, 1:16 PM

## 2017-04-26 ENCOUNTER — Encounter: Payer: Self-pay | Admitting: *Deleted

## 2017-04-26 ENCOUNTER — Inpatient Hospital Stay
Admit: 2017-04-26 | Discharge: 2017-04-26 | Disposition: A | Discharge status: Home / Self Care | Payer: PPO | Attending: Internal Medicine | Admitting: Internal Medicine

## 2017-04-26 LAB — LIPID PANEL
CHOL/HDL RATIO: 3.8 ratio
CHOLESTEROL: 203 mg/dL — AB (ref 0–200)
HDL: 54 mg/dL (ref >40)
LDL Cholesterol: 131 mg/dL — ABNORMAL HIGH (ref 0–99)
TRIGLYCERIDES: 88 mg/dL (ref <150)
VLDL: 18 mg/dL (ref 0–40)

## 2017-04-26 LAB — GLUCOSE, CAPILLARY: Glucose-Capillary: 156 mg/dL — ABNORMAL HIGH (ref 65–99)

## 2017-04-26 MED ORDER — ONDANSETRON HCL 4 MG/2ML IJ SOLN
4.0000 mg | Freq: Once | INTRAMUSCULAR | Status: AC
  Administered 2017-04-26: 12:00:00 4 mg via INTRAVENOUS
  Filled 2017-04-26: qty 2

## 2017-04-26 MED ORDER — SODIUM CHLORIDE 0.9 % IV BOLUS (SEPSIS)
500.0000 mL | Freq: Once | INTRAVENOUS | Status: AC
  Administered 2017-04-26: 500 mL via INTRAVENOUS

## 2017-04-26 MED ORDER — ATORVASTATIN CALCIUM 20 MG PO TABS
40.0000 mg | ORAL_TABLET | Freq: Every day | ORAL | Status: DC
  Administered 2017-04-26: 18:00:00 40 mg via ORAL
  Filled 2017-04-26: qty 2

## 2017-04-26 MED ORDER — SENNOSIDES-DOCUSATE SODIUM 8.6-50 MG PO TABS
2.0000 | ORAL_TABLET | Freq: Every day | ORAL | Status: DC | PRN
  Administered 2017-04-26: 18:00:00 2 via ORAL
  Filled 2017-04-26: qty 2

## 2017-04-26 MED ORDER — ASPIRIN EC 81 MG PO TBEC
81.0000 mg | DELAYED_RELEASE_TABLET | Freq: Every day | ORAL | Status: DC
  Administered 2017-04-26 – 2017-04-27 (×2): 81 mg via ORAL
  Filled 2017-04-26 (×2): qty 1

## 2017-04-26 NOTE — Progress Notes (Signed)
[facility] NAME: Donna Rhodes    MR#:  [id number]  DATE OF BIRTH:  05/13/1946  SUBJECTIVE:  Patient presented with left upper extremity numbness and heaviness facial drool and left lower extremity weakness. Found to have acute CVA on MRI Tolerating po diet  daughter in the room REVIEW OF SYSTEMS:   Review of Systems  Constitutional: Negative for chills, fever and weight loss.  HENT: Negative for ear discharge, ear pain and nosebleeds.   Eyes: Negative for blurred vision, pain and discharge.  Respiratory: Negative for sputum production, shortness of breath, wheezing and stridor.   Cardiovascular: Negative for chest pain, palpitations, orthopnea and PND.  Gastrointestinal: Negative for abdominal pain, diarrhea, nausea and vomiting.  Genitourinary: Negative for frequency and urgency.  Musculoskeletal: Negative for back pain and joint pain.  Neurological: Positive for sensory change, focal weakness and weakness. Negative for speech change.  Psychiatric/Behavioral: Negative for depression and hallucinations. The patient is not nervous/anxious.    Tolerating Diet:yes Tolerating PT: CIR  DRUG ALLERGIES:   Allergies  Allergen Reactions  . Amoxicillin Other (See Comments)    Irritated tongue  . Avelox [Moxifloxacin Hcl In Nacl]   . Azithromycin Other (See Comments)    Caused mouth and tongue to be sore  . Codeine   . Estroven Weight Management [Nutritional Supplements]     Mouth soreness  . Moxifloxacin Other (See Comments)  . Naproxen     VITALS:  Blood pressure 123/73, pulse 67, temperature 97.8 F (36.6 C), temperature source Oral, resp. rate 18, height 5\' 4"  (1.626 m), weight 59 kg (130 lb 1.6 oz), last menstrual period 12/29/1981, SpO2 98 %.  PHYSICAL EXAMINATION:   Physical Exam  GENERAL:  70 y.o.-year-old patient lying in the bed with no acute distress.  EYES: Pupils equal, round, reactive to light and  accommodation. No scleral icterus. Extraocular muscles intact.  HEENT: Head atraumatic, normocephalic. Oropharynx and nasopharynx clear.  NECK:  Supple, no jugular venous distention. No thyroid enlargement, no tenderness.  LUNGS: Normal breath sounds bilaterally, no wheezing, rales, rhonchi. No use of accessory muscles of respiration.  CARDIOVASCULAR: S1, S2 normal. No murmurs, rubs, or gallops.  ABDOMEN: Soft, nontender, nondistended. Bowel sounds present. No organomegaly or mass.  EXTREMITIES: No cyanosis, clubbing or edema b/l.    NEUROLOGIC: Left upper extremity 1/5 and left lower extremity 3+ over 5 weakness. Reflexes absent left upper and lower extremity. Increased sensation left upper and lower extremity. Mild left facial droop  PSYCHIATRIC:  patient is alert and oriented x 3.  SKIN: No obvious rash, lesion, or ulcer.   LABORATORY PANEL:  CBC  Recent Labs Lab 04/25/17 0443  WBC 4.6  HGB 12.2  HCT 36.0  PLT 232    Chemistries   Recent Labs Lab 04/25/17 0443  NA 141  K 3.7  CL 111  CO2 25  GLUCOSE 95  BUN 11  CREATININE 0.58  CALCIUM 8.6*  AST 28  ALT 26  ALKPHOS 88  BILITOT 0.6   Cardiac Enzymes  Recent Labs Lab 04/24/17 0608  TROPONINI <0.03   RADIOLOGY:  Ct Head Wo Contrast  Result Date: 04/24/2017 CLINICAL DATA:  Left-sided weakness beginning chest prior to admission. EXAM: CT HEAD WITHOUT CONTRAST TECHNIQUE: Contiguous axial images were obtained from the base of the skull through the vertex without intravenous contrast. COMPARISON:  MRI same day. FINDINGS: Brain: No apparent change since the MRI of 8 hours ago. No sign of  acute infarction, mass lesion, hemorrhage, hydrocephalus or extra-axial collection. Dilated perivascular spaces at the base of the brain on the right. Minimal small vessel change of the hemispheric white matter. Vascular: There is atherosclerotic calcification of the major vessels at the base of the brain. Skull: Negative Sinuses/Orbits:  Clear/normal Other: None IMPRESSION: No acute finding by CT. No appreciable change since the MRI of 8 hours ago. No sign of acute infarction or hyperdense vessel. Aspects 10. These results were called by telephone at the time of interpretation on 04/24/2017 at 3:08 pm to Dr. Lisa Roca , who verbally acknowledged these results. Electronically Signed   By: Nelson Chimes M.D.   On: 04/24/2017 15:08   Mr Angiogram Head Wo Contrast  Result Date: 04/24/2017 CLINICAL DATA:  Left arm and leg weakness which has developed today, since the previous imaging. EXAM: MRI HEAD WITHOUT CONTRAST MRA HEAD WITHOUT CONTRAST TECHNIQUE: Multiplanar, multiecho pulse sequences of the brain and surrounding structures were obtained without intravenous contrast. Angiographic images of the head were obtained using MRA technique without contrast. COMPARISON:  CT and MRI same day. FINDINGS: MRI HEAD FINDINGS Brain: Diffusion imaging shows acute infarction in the left basal ganglia/ posterior limb internal capsule. Mild chronic small-vessel ischemic changes elsewhere within the cerebral hemispheric white matter are unchanged. No evidence of mass effect or hemorrhage. No hydrocephalus. No extra-axial collection. No mass lesion. Vascular: Major vessels at the base of the brain show flow. Skull and upper cervical spine: Negative Sinuses/Orbits: Clear/normal Other: None significant MRA HEAD FINDINGS Both internal carotid arteries are widely patent through the skullbase and siphon regions. There is mild atherosclerotic irregularity in the carotid siphon regions but no flow-limiting stenosis. The anterior and middle cerebral vessels are patent without stenosis, aneurysm or vascular malformation. Hypoplastic A1 segment on the left. Azygos anterior cerebral artery as a normal variant. Left PCA takes fetal origin from the left carotid. Both vertebral arteries widely patent to the basilar. No basilar stenosis. Posterior circulation branch vessels  appear normal. 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 No evidence of hemorrhage or mass effect. Negative intracranial MR angiography of the large and medium size vessels. No occlusion or correctable proximal stenosis. Some atherosclerotic irregularity in the carotid siphon regions without stenosis. Anatomic variations of hypoplastic A1 segment on the left, azygos anterior cerebral artery and fetal origin left PCA. Electronically Signed   By: Nelson Chimes M.D.   On: 04/24/2017 18:12   Mr Angiogram Neck W Or Wo Contrast  Result Date: 04/24/2017 CLINICAL DATA:  Left-sided weakness. Acute stroke right basal ganglia posterior limb internal capsule EXAM: MRA NECK WITHOUT AND WITH CONTRAST TECHNIQUE: Multiplanar and multiecho pulse sequences of the neck were obtained without and with intravenous contrast. Angiographic images of the neck were obtained using MRA technique without and with intravenous contrast. CONTRAST:  34mL MULTIHANCE GADOBENATE DIMEGLUMINE 529 MG/ML IV SOLN COMPARISON:  MRI same day FINDINGS: Branching pattern of the brachiocephalic vessels from the arch is normal. No origin stenoses. Both common carotid arteries are widely patent to the bifurcations. Both carotid bifurcations appear normal without stenosis or irregularity. Cervical internal carotid arteries are normal. Both vertebral artery origins are widely patent. The vertebral arteries are approximately equal in size an widely patent through the cervical region to the basilar. IMPRESSION: Normal MR angiography of the neck vessels. Electronically Signed   By: Nelson Chimes M.D.   On: 04/24/2017 18:28   Mr Brain Wo Contrast (neuro Protocol)  Result Date:  04/24/2017 CLINICAL DATA:  Left arm and leg weakness which has developed today, since the previous imaging. EXAM: MRI HEAD WITHOUT CONTRAST MRA HEAD WITHOUT CONTRAST TECHNIQUE: Multiplanar, multiecho pulse sequences of the brain and surrounding  structures were obtained without intravenous contrast. Angiographic images of the head were obtained using MRA technique without contrast. COMPARISON:  CT and MRI same day. FINDINGS: MRI HEAD FINDINGS Brain: Diffusion imaging shows acute infarction in the left basal ganglia/ posterior limb internal capsule. Mild chronic small-vessel ischemic changes elsewhere within the cerebral hemispheric white matter are unchanged. No evidence of mass effect or hemorrhage. No hydrocephalus. No extra-axial collection. No mass lesion. Vascular: Major vessels at the base of the brain show flow. Skull and upper cervical spine: Negative Sinuses/Orbits: Clear/normal Other: None significant MRA HEAD FINDINGS Both internal carotid arteries are widely patent through the skullbase and siphon regions. There is mild atherosclerotic irregularity in the carotid siphon regions but no flow-limiting stenosis. The anterior and middle cerebral vessels are patent without stenosis, aneurysm or vascular malformation. Hypoplastic A1 segment on the left. Azygos anterior cerebral artery as a normal variant. Left PCA takes fetal origin from the left carotid. Both vertebral arteries widely patent to the basilar. No basilar stenosis. Posterior circulation branch vessels appear normal. 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 No evidence of hemorrhage or mass effect. Negative intracranial MR angiography of the large and medium size vessels. No occlusion or correctable proximal stenosis. Some atherosclerotic irregularity in the carotid siphon regions without stenosis. Anatomic variations of hypoplastic A1 segment on the left, azygos anterior cerebral artery and fetal origin left PCA. Electronically Signed   By: Nelson Chimes M.D.   On: 04/24/2017 18:12   Mr Brain Wo Contrast (neuro Protocol)  Result Date: 04/24/2017 CLINICAL DATA:  Multiple falls. EXAM: MRI HEAD WITHOUT CONTRAST TECHNIQUE: Multiplanar,  multiecho pulse sequences of the brain and surrounding structures were obtained without intravenous contrast. COMPARISON:  CT head earlier today. FINDINGS: Brain: No evidence for acute infarction, hemorrhage, mass lesion, hydrocephalus, or extra-axial fluid. Normal for age cerebral volume. Mild subcortical and periventricular T2 and FLAIR hyperintensities, likely chronic microvascular ischemic change. Vascular: Flow voids are maintained throughout the carotid, basilar, and vertebral arteries. There are no areas of chronic hemorrhage. Skull and upper cervical spine: Unremarkable visualized calvarium, skullbase, and cervical vertebrae. Pituitary, pineal, cerebellar tonsils unremarkable. Cervical spondylosis with central protrusion at C4-C5. Unknown degree of canal stenosis. Sinuses/Orbits: No orbital masses or proptosis. Globes appear symmetric. Sinuses appear well aerated, without evidence for air-fluid level. Other: No nasopharyngeal pathology or mastoid fluid. Scalp and other visualized extracranial soft tissues grossly unremarkable. IMPRESSION: No acute intracranial findings. Mild subcortical and periventricular T2 and FLAIR hyperintensities, likely chronic microvascular ischemic change. Cervical spondylosis with central protrusion at C4-5, unknown degree of canal stenosis. Electronically Signed   By: Staci Righter M.D.   On: 04/24/2017 09:16   ASSESSMENT AND PLAN:  *ADAISHA CAMPISE is a 71 y.o. female has a past medical history significant for HTN, [name], [name], [name]  1. 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   Essential hypertension allow permissive hypertension    3. Hypothyroidism Synthroid  4.Hypercholesterolemia Statins  Discussed with patient's daughter in the room. Questions answered Inpatient rehabilitation bed. Patient will discharge once bed available Case discussed with Care Management/Social Worker. Management plans discussed with the patient, family and they are in agreement.  CODE STATUS: full  DVT Prophylaxis: lovenox  TOTAL TIME TAKING CARE OF THIS PATIENT: *30* minutes.  >50% time spent on counselling and coordination of care  POSSIBLE D/C IN **1-2* DAYS, DEPENDING ON CLINICAL CONDITION.  Note: This dictation was prepared with Dragon dictation along with smaller phrase technology. Any transcriptional errors that result from this process are unintentional.  Zaleah Ternes M.D on 04/26/2017 at 8:52 AM  Between 7am to 6pm - Pager - [phone]  After 6pm go to [email or web address] - password [email or web address] [facility]  Office  [phone]  CC: Primary care physician; Einar Pheasant, MD

## 2017-04-26 NOTE — Evaluation (Signed)
Occupational Therapy Evaluation Patient Details Name: Donna Rhodes MRN: [id number] DOB: 1946-12-15 Today's Date: 04/26/2017    History of Present Illness Patient is a 71 y.o. female admitted on 53 APR with acute basal ganglia/posterior limb internal capsule infarction with flacid L UE. Patient was at ED with negative CT/MRI earlier in day and returned 4 hours later with change in status. PMH includes HTN, hypercholestolemia, hypothyroidism, depression, and GERD.   Clinical Impression   Pt. Is a 71  y.o. female who was living at home alone and admitted for a CVA with Left sided weakness and basal ganglia/post limb internal capsule infarction. Pt. presents with flat affect and teary about status, decreased strength, decreased functional hand use in dominant hand, no active volitional movement elicited in L hand and minimal in shoulder and elbow, and decreased functional mobility which hinder her ability to complete ADL and IADL tasks. Patient could benefit from skilled OT services to work on LUE neuro muscular re-ed, therapeutic exercises, positioning, and pt./family education.  Patient and family education was provided about stroke recovery, LUE ROM and positioning.  Patient would be a good candidate to continue OT services at [facility]    Follow Up Recommendations  CIR    Equipment Recommendations  3 in 1 bedside commode;Tub/shower bench (dycem, rocker knife)    Recommendations for Other Services       Precautions / Restrictions Precautions Precautions: Fall Restrictions Weight Bearing Restrictions: No      Mobility Bed Mobility Overal bed mobility: Needs Assistance Bed Mobility: Supine to Sit     Supine to sit: Min assist;Min guard     General bed mobility comments: Assist for LLE over edge of bed and Min guard for trunk to sit  Transfers Overall transfer level: Needs assistance Equipment used: None Transfers: Sit to/from [facility] Sit to Stand: Mod  assist;+2 physical assistance Stand pivot transfers: Mod assist;+2 physical assistance       General transfer comment: Performed bed to/from [facility]. STS performed again for short ambulation bed to chair    Balance Overall balance assessment: Needs assistance Sitting-balance support: Single extremity supported;Feet supported Sitting balance-Leahy Scale: Good     Standing balance support: Bilateral upper extremity supported;During functional activity Standing balance-Leahy Scale: Poor                             ADL either performed or assessed with clinical judgement   ADL Overall ADL's : Needs assistance/impaired Eating/Feeding: Set up;Minimal assistance Eating/Feeding Details (indicate cue type and reason): using R hand which is non dominant; rec dycem and rocker knife Grooming: Wash/dry hands;Wash/dry face;Oral care;Brushing hair;Set up;Minimal assistance Grooming Details (indicate cue type and reason): pt doing well with trying to use L hand to help stabilize for ADLs like feeding and grooming skills         Upper Body Dressing : Moderate assistance;Set up Upper Body Dressing Details (indicate cue type and reason): cue for one handed tech and to dress L side first Lower Body Dressing: Maximal assistance;Set up Lower Body Dressing Details (indicate cue type and reason): tends to lean to R and decreased movement in L ankle and toes; max assist to stand to pull pants over hips                     Vision   Eye Alignment: Within Functional Limits Additional Comments: Pt appears to have no change from baseline with decreased convergence  in bilateral eyes but able to read and use eyes for functional tasks     Perception     Praxis      Pertinent Vitals/Pain Pain Assessment: No/denies pain     Hand Dominance Left   Extremity/Trunk Assessment Upper Extremity Assessment Upper Extremity Assessment: Generalized weakness;LUE deficits/detail LUE Deficits /  Details: Decreased sensation to L hand from MCPs to finger tips dorsal aspect; no active movement in L hand and minimal movement in shoulder and elbow with increased flexor tone and no subluxation LUE Sensation: decreased light touch LUE Coordination: decreased fine motor;decreased gross motor   Lower Extremity Assessment Lower Extremity Assessment: Defer to PT evaluation       Communication Communication Communication: No difficulties   Cognition Arousal/Alertness: Awake/alert Behavior During Therapy: WFL for tasks assessed/performed;Flat affect Overall Cognitive Status: Within Functional Limits for tasks assessed                                 General Comments: Pt crying during evaluation but did well with encouragement and redirection   General Comments       Exercises Exercises: General Lower Extremity;Other exercises General Exercises - Lower Extremity Ankle Circles/Pumps: PROM;Left;20 reps (AROM R) Quad Sets: Strengthening;Both;20 reps (10x stand) Gluteal Sets: Strengthening;Both;20 reps Short Arc Quad: AROM;Strengthening;Both;20 reps Heel Slides: AROM;AAROM;Both;20 reps (very min A on L) Hip ABduction/ADduction: AROM;AAROM;Both;20 reps (assist on L to maintain LLE up) Straight Leg Raises: AAROM;Left;10 reps (2 sets; strengthening on R) Other Exercises Other Exercises: Stand wt shift to L with 2 person assist Other Exercises: ankle inv/ever, clockwise and counterclockwise 20x each with assist   Shoulder Instructions      Home Living Family/patient expects to be discharged to:: Private residence Living Arrangements: Alone Available Help at Discharge: Family;Available PRN/intermittently Type of Home: House Home Access: Stairs to enter [employer] of Steps: 4 Entrance Stairs-Rails: Can reach both Home Layout: One level     Bathroom Shower/Tub: Corporate investment banker: Standard     Home Equipment: None           Prior Functioning/Environment Level of Independence: Independent        Comments: Patient previously independent and was helping take care of her 48 year old mother, mow the grass, till dirt for flower planting, etc.        OT Problem List: Impaired balance (sitting and/or standing);Impaired UE functional use;Decreased activity tolerance;Decreased range of motion;Decreased strength;Decreased coordination;Decreased knowledge of use of DME or AE;Impaired tone      OT Treatment/Interventions: Self-care/ADL training;Therapeutic exercise;Neuromuscular education;Therapeutic activities;Balance training;Patient/family education    OT Goals(Current goals can be found in the care plan section) Acute Rehab OT Goals Patient Stated Goal: "to get back to doing things for myself again" OT Goal Formulation: With patient/family Time For Goal Achievement: 05/10/17 Potential to Achieve Goals: Good ADL Goals Pt Will Perform Eating: with min assist;with set-up;sitting Pt Will Perform Grooming: with set-up;with min assist;sitting Pt Will Perform Upper Body Dressing: with set-up;with min assist;sitting Pt Will Perform Lower Body Dressing: with set-up;with max assist;sit to/from stand Pt Will Transfer to Toilet: with set-up;with mod assist;stand pivot transfer;bedside commode (BSC over toilet with platform walker and no LOB) Pt/caregiver will Perform Home Exercise Program: Increased ROM;Left upper extremity;With theraband;With written HEP provided  OT Frequency: Min 3X/week   Barriers to D/C:            Co-evaluation  AM-PAC PT "6 Clicks" Daily Activity     Outcome Measure Help from another person eating meals?: A Little Help from another person taking care of personal grooming?: A Little Help from another person toileting, which includes using toliet, bedpan, or urinal?: A Lot Help from another person bathing (including washing, rinsing, drying)?: A Lot Help from another person  to put on and taking off regular upper body clothing?: A Lot Help from another person to put on and taking off regular lower body clothing?: A Lot 6 Click Score: 14   End of Session    Activity Tolerance: Patient tolerated treatment well Patient left: in chair;with call bell/phone within reach;with chair alarm set;with family/visitor present  OT Visit Diagnosis: Muscle weakness (generalized) (M62.81);Hemiplegia and hemiparesis;Unsteadiness on feet (R26.81) Hemiplegia - Right/Left: Left Hemiplegia - dominant/non-dominant: Dominant Hemiplegia - caused by: Cerebral infarction                Time: 1400-1440 OT Time Calculation (min): 40 min Charges:  OT General Charges $OT Visit: 1 Procedure OT Evaluation $OT Eval Moderate Complexity: 1 Procedure OT Treatments $Self Care/Home Management : 8-22 mins $Neuromuscular Re-education: 8-22 mins G-Codes:     Chrys Racer, OTR/L ascom [phone] 04/26/17, 3:03 PM

## 2017-04-26 NOTE — Evaluation (Signed)
Clinical/Bedside Swallow Evaluation Patient Details  Name: ANWITA MENCER MRN: [id number] Date of Birth: 04/25/1946  Today's Date: 04/26/2017 Time: SLP Start Time (ACUTE ONLY): 1000 SLP Stop Time (ACUTE ONLY): 1100 SLP Time Calculation (min) (ACUTE ONLY): 60 min  Past Medical History:  Past Medical History:  Diagnosis Date  . Atrophic vaginitis   . Dysplasia of vagina    vin 1 - vulva and vaginal cuff  . GERD (gastroesophageal reflux disease)   . HCV infection   . HSV (herpes simplex virus) infection   . Hypercholesterolemia   . Hypertension   . Hypothyroidism   . Lung nodule    stable  . Menopausal state   . Osteoarthritis    hand involvement, cervical disc disease  . Osteoporosis   . Pelvic pain in female   . Thyroid nodule   . Vaginal Pap smear, abnormal    lgsil   Past Surgical History:  Past Surgical History:  Procedure Laterality Date  . ADENOIDECTOMY    . APPENDECTOMY  1965  . [place]   left  . CHOLECYSTECTOMY    . [place]  . laser vein surgery    . MANDIBLE RECONSTRUCTION  1992  . TOENAIL EXCISION  1998   several  . [employer]  . VAGINAL HYSTERECTOMY  1983   als0- anterior/posterior colporrhaphy  . VAGINAL WOUND CLOSURE / REPAIR  1999, 2000  . VAGINAL WOUND CLOSURE / REPAIR     HPI:  Patient is a 71 y.o. 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 Patient was at ED with negative CT/MRI earlier in day and returned 4 hours later with change in status. PMH includes HTN, hypercholestolemia, hypothyroidism, depression, and GERD. Currently, pt and daughter deny any gross difficulty swallowing described by overt s/s of aspiration, however, pt endorsed a "thick feeling" in her throat on the Left side "sometimes" w/ solids as she attempted to swallow/clear fully. Pt does have outward weakness in the lingual/labial ROM/tone and GERD baseline. Noted min+ Dysarthria  which indicates need for further assessment/tx. Pt is A/O x3; able to follow all commands appopriately. Appeared tired but good follow through w/ all task/instructions.    Assessment / Plan / Recommendation Clinical Impression  Pt appears at reduced risk for aspiration when following general aspiration precautions during oral intake. Pt consumed po trials of thin liquids VIA CUP(straw not assessed) w/ no overt s/s of aspiration noted; no decline in respiratory status or vocal quality during/post trials. Oral phase c/b adequate bolus management and timely swallowing/clearing. Education given to daughter and pt on the importance of fully masticating solids and drinking slowly b/t bites; also f/u w/ a second swallowing turning head slightly to her Left during the swallow if she feels that "thick feeling". Also discussed other aspiration precautions and use of Puree for swallowing Pills(whole) for easier Pill management/clearing. Noted pt's volume of speech was adequate; intelligibility of speech appropriate w/ decreased articulation precision of certain speech sounds impacting full intelligibility 100% of the time. Pt instructed on use of Incentive Spirometer at this evaluation as well as Overarticulation strategy; slowing down; being louder overall. Instruction and handouts given on OMEs targeting Left lingual-labial weakness. Recommended a mech soft diet w/ aspiration precautions(assistance as needed d/t LUE weakness); OMEs targeting OM strengthening/tone; Dysarthria assessment/tx is indicated.  SLP Visit Diagnosis: Dysphagia, oropharyngeal phase (R13.12)    Aspiration Risk   (reduced following general precautions)  Diet Recommendation  Dysphagia level 3(mech soft); thin liquids. General aspiration precautions. Assistance at meals d/t LUE weakness.   Medication Administration: Whole meds with puree    Other  Recommendations Recommended Consults:  (Dietician f/u) Oral Care Recommendations: Oral care  BID;Patient independent with oral care;Staff/trained caregiver to provide oral care (LUE weakness)   Follow up Recommendations Inpatient Rehab      Frequency and Duration min 2x/week  1 week       Prognosis Prognosis for Safe Diet Advancement: Good      Swallow Study   General Date of Onset: 04/24/17 HPI: Patient is a 71 y.o. 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 Patient was at ED with negative CT/MRI earlier in day and returned 4 hours later with change in status. PMH includes HTN, hypercholestolemia, hypothyroidism, depression, and GERD. Currently, pt and daughter deny any gross difficulty swallowing described by overt s/s of aspiration, however, pt endorsed a "thick feeling" in her throat on the Left side "sometimes" w/ solids as she attempted to swallow/clear fully. Pt does have outward weakness in the lingual/labial ROM/tone and GERD baseline. Noted min+ Dysarthria which indicates need for further assessment/tx. Pt is A/O x3; able to follow all commands appopriately. Appeared tired but good follow through w/ all task/instructions.  Type of Study: Bedside Swallow Evaluation Previous Swallow Assessment: none indicated Diet Prior to this Study: Regular;Thin liquids Temperature Spikes Noted: No (wbc 4.6) Respiratory Status: Room air History of Recent Intubation: No Behavior/Cognition: Alert;Cooperative;Pleasant mood (somewhat tired appearing) Oral Cavity Assessment: Within Functional Limits Oral Care Completed by SLP: Recent completion by staff Oral Cavity - Dentition: Adequate natural dentition Vision: Functional for self-feeding Self-Feeding Abilities: Able to feed self;Needs set up;Needs assist (LUE flacid) Patient Positioning: Upright in bed Baseline Vocal Quality: Low vocal intensity;Normal Volitional Cough: Strong Volitional Swallow: Able to elicit    Oral/Motor/Sensory Function Overall Oral  Motor/Sensory Function: Mild impairment (-Moderate impairment) Facial ROM: Reduced left Facial Symmetry: Abnormal symmetry left (min) Facial Strength: Reduced left Lingual ROM: Reduced left Lingual Symmetry: Abnormal symmetry left (deviation) Lingual Strength: Reduced (reaching to the Right side but wfl for protrusion) Lingual Sensation: Within Functional Limits (grossly) Velum: Within Functional Limits Mandible: Within Functional Limits   Ice Chips Ice chips: Not tested   Thin Liquid Thin Liquid: Within functional limits Presentation: Self Fed;Cup (few sips only) No anterior leakage; adequate labial seal around cup rim   Nectar Thick Nectar Thick Liquid: Not tested   Honey Thick Honey Thick Liquid: Not tested   Puree Puree: Not tested   Solid   GO   Solid: Not tested    Functional Assessment Tool Used: clinical judgement Functional Limitations: Swallowing Swallow Current Status (W4665): At least 1 percent but less than 20 percent impaired, limited or restricted Swallow Goal Status [phone]): At least 1 percent but less than 20 percent impaired, limited or restricted Swallow Discharge Status [phone]): At least 1 percent but less than 20 percent impaired, limited or restricted    Orinda Kenner, MS, CCC-SLP Watson,[name] 04/26/2017,5:10 PM

## 2017-04-26 NOTE — Progress Notes (Signed)
[facility] inpatient rehab admissions - I am following for potential acute inpatient rehab admission.  I will await OT eval and recommendations.  We will need insurance authorization prior to inpatient rehab admission.  Call me for questions.  #[phone]

## 2017-04-26 NOTE — Progress Notes (Signed)
[facility] inpatient rehab admissions - I have called and opened the case requesting acute inpatient rehab admission with [employer].  I will await call back from insurance case manager.  Call me for questions.  #[phone]

## 2017-04-26 NOTE — Progress Notes (Addendum)
Subjective: Patient with further weakness on the LUE.  Complains of nausea.    Objective: Current vital signs: BP 129/66 (BP Location: Right Arm)   Pulse 71   Temp 98.2 F (36.8 C) (Oral)   Resp 18   Ht 5\' 4"  (1.626 m)   Wt 59 kg (130 lb 1.6 oz)   LMP 12/29/1981   SpO2 98%   BMI 22.33 kg/m  Vital signs in last 24 hours: Temp:  [97.7 F (36.5 C)-98.4 F (36.9 C)] 98.2 F (36.8 C) (04/30 0930) Pulse Rate:  [66-74] 71 (04/30 0930) Resp:  [16-18] 18 (04/30 0418) BP: (119-129)/(56-73) 129/66 (04/30 0930) SpO2:  [95 %-99 %] 98 % (04/30 0930) Weight:  [59 kg (130 lb 1.6 oz)] 59 kg (130 lb 1.6 oz) (04/30 0500)  Intake/Output from previous day: 04/29 0701 - 04/30 0700 In: 785 [P.O.:520; I.V.:265] Out: -  Intake/Output this shift: Total I/O In: 240 [P.O.:240] Out: -  Nutritional status: Diet Heart Room service appropriate? Yes; Fluid consistency: Thin  Neurologic Exam: Mental Status: Awake, alert, oriented, thought content appropriate.  Speech fluent without evidence of aphasia but mildly dysarthric.  Able to follow 3 step commands without difficulty. Cranial Nerves: II: Discs flat bilaterally; Visual fields grossly normal, pupils equal, round, reactive to light and accommodation III,IV, VI: ptosis not present, extra-ocular motions intact bilaterally V,VII: left facial droop, facial light touch sensation normal bilaterally VIII: hearing normal bilaterally IX,X: gag reflex present XI: bilateral shoulder shrug XII: tongue deviation to the left Motor: Right :  Upper extremity   5/5                                      Left:     Upper extremity   0-1/5             Lower extremity   5/5                                                  Lower extremity   4/5 Increased tone on the left Sensory: Pinprick and light touch intact throughout, bilaterally  Lab Results: Basic Metabolic Panel:  Recent Labs Lab 04/24/17 0608 04/25/17 0443  NA 141 141  K 3.4* 3.7  CL 109 111  CO2  25 25  GLUCOSE 101* 95  BUN 17 11  CREATININE 0.80 0.58  CALCIUM 9.0 8.6*    Liver Function Tests:  Recent Labs Lab 04/25/17 0443  AST 28  ALT 26  ALKPHOS 88  BILITOT 0.6  PROT 6.4*  ALBUMIN 3.6   No results for input(s): LIPASE, AMYLASE in the last 168 hours. No results for input(s): AMMONIA in the last 168 hours.  CBC:  Recent Labs Lab 04/24/17 0608 04/25/17 0443  WBC 5.6 4.6  HGB 12.6 12.2  HCT 37.2 36.0  MCV 92.3 93.7  PLT 248 232    Cardiac Enzymes:  Recent Labs Lab 04/24/17 0608  TROPONINI <0.03    Lipid Panel:  Recent Labs Lab 04/26/17 0430  CHOL 203*  TRIG 88  HDL 54  CHOLHDL 3.8  VLDL 18  LDLCALC 131*    CBG:  Recent Labs Lab 04/25/17 0723 04/26/17 0823  GLUCAP 84 156*    Microbiology: Results for orders placed or performed in visit  on 05/25/13  Urine culture     Status: None   Collection Time: 05/25/13 10:25 AM  Result Value Ref Range Status   Colony Count NO GROWTH  Final   Organism ID, Bacteria NO GROWTH  Final    Coagulation Studies: No results for input(s): LABPROT, INR in the last 72 hours.  Imaging: Ct Head Wo Contrast  Result Date: 04/24/2017 CLINICAL DATA:  Left-sided weakness beginning chest prior to admission. EXAM: CT HEAD WITHOUT CONTRAST TECHNIQUE: Contiguous axial images were obtained from the base of the skull through the vertex without intravenous contrast. COMPARISON:  MRI same day. FINDINGS: Brain: No apparent change since the MRI of 8 hours ago. No sign of acute infarction, mass lesion, hemorrhage, hydrocephalus or extra-axial collection. Dilated perivascular spaces at the base of the brain on the right. Minimal small vessel change of the hemispheric white matter. Vascular: There is atherosclerotic calcification of the major vessels at the base of the brain. Skull: Negative Sinuses/Orbits: Clear/normal Other: None IMPRESSION: No acute finding by CT. No appreciable change since the MRI of 8 hours ago. No sign  of acute infarction or hyperdense vessel. Aspects 10. These results were called by telephone at the time of interpretation on 04/24/2017 at 3:08 pm to Dr. Lisa Roca , who verbally acknowledged these results. Electronically Signed   By: Nelson Chimes M.D.   On: 04/24/2017 15:08   Mr Angiogram Head Wo Contrast  Result Date: 04/24/2017 CLINICAL DATA:  Left arm and leg weakness which has developed today, since the previous imaging. EXAM: MRI HEAD WITHOUT CONTRAST MRA HEAD WITHOUT CONTRAST TECHNIQUE: Multiplanar, multiecho pulse sequences of the brain and surrounding structures were obtained without intravenous contrast. Angiographic images of the head were obtained using MRA technique without contrast. COMPARISON:  CT and MRI same day. FINDINGS: MRI HEAD FINDINGS Brain: Diffusion imaging shows acute infarction in the left basal ganglia/ posterior limb internal capsule. Mild chronic small-vessel ischemic changes elsewhere within the cerebral hemispheric white matter are unchanged. No evidence of mass effect or hemorrhage. No hydrocephalus. No extra-axial collection. No mass lesion. Vascular: Major vessels at the base of the brain show flow. Skull and upper cervical spine: Negative Sinuses/Orbits: Clear/normal Other: None significant MRA HEAD FINDINGS Both internal carotid arteries are widely patent through the skullbase and siphon regions. There is mild atherosclerotic irregularity in the carotid siphon regions but no flow-limiting stenosis. The anterior and middle cerebral vessels are patent without stenosis, aneurysm or vascular malformation. Hypoplastic A1 segment on the left. Azygos anterior cerebral artery as a normal variant. Left PCA takes fetal origin from the left carotid. Both vertebral arteries widely patent to the basilar. No basilar stenosis. Posterior circulation branch vessels appear normal. 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 No evidence of  hemorrhage or mass effect. Negative intracranial MR angiography of the large and medium size vessels. No occlusion or correctable proximal stenosis. Some atherosclerotic irregularity in the carotid siphon regions without stenosis. Anatomic variations of hypoplastic A1 segment on the left, azygos anterior cerebral artery and fetal origin left PCA. Electronically Signed   By: Nelson Chimes M.D.   On: 04/24/2017 18:12   Mr Angiogram Neck W Or Wo Contrast  Result Date: 04/24/2017 CLINICAL DATA:  Left-sided weakness. Acute stroke right basal ganglia posterior limb internal capsule EXAM: MRA NECK WITHOUT AND WITH CONTRAST TECHNIQUE: Multiplanar and multiecho pulse sequences of the neck were obtained without and with intravenous contrast. Angiographic images of the neck were obtained using  MRA technique without and with intravenous contrast. CONTRAST:  42mL MULTIHANCE GADOBENATE DIMEGLUMINE 529 MG/ML IV SOLN COMPARISON:  MRI same day FINDINGS: Branching pattern of the brachiocephalic vessels from the arch is normal. No origin stenoses. Both common carotid arteries are widely patent to the bifurcations. Both carotid bifurcations appear normal without stenosis or irregularity. Cervical internal carotid arteries are normal. Both vertebral artery origins are widely patent. The vertebral arteries are approximately equal in size an widely patent through the cervical region to the basilar. IMPRESSION: Normal MR angiography of the neck vessels. Electronically Signed   By: Nelson Chimes M.D.   On: 04/24/2017 18:28   Mr Brain Wo Contrast (neuro Protocol)  Result Date: 04/24/2017 CLINICAL DATA:  Left arm and leg weakness which has developed today, since the previous imaging. EXAM: MRI HEAD WITHOUT CONTRAST MRA HEAD WITHOUT CONTRAST TECHNIQUE: Multiplanar, multiecho pulse sequences of the brain and surrounding structures were obtained without intravenous contrast. Angiographic images of the head were obtained using MRA technique  without contrast. COMPARISON:  CT and MRI same day. FINDINGS: MRI HEAD FINDINGS Brain: Diffusion imaging shows acute infarction in the left basal ganglia/ posterior limb internal capsule. Mild chronic small-vessel ischemic changes elsewhere within the cerebral hemispheric white matter are unchanged. No evidence of mass effect or hemorrhage. No hydrocephalus. No extra-axial collection. No mass lesion. Vascular: Major vessels at the base of the brain show flow. Skull and upper cervical spine: Negative Sinuses/Orbits: Clear/normal Other: None significant MRA HEAD FINDINGS Both internal carotid arteries are widely patent through the skullbase and siphon regions. There is mild atherosclerotic irregularity in the carotid siphon regions but no flow-limiting stenosis. The anterior and middle cerebral vessels are patent without stenosis, aneurysm or vascular malformation. Hypoplastic A1 segment on the left. Azygos anterior cerebral artery as a normal variant. Left PCA takes fetal origin from the left carotid. Both vertebral arteries widely patent to the basilar. No basilar stenosis. Posterior circulation branch vessels appear normal. 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 No evidence of hemorrhage or mass effect. Negative intracranial MR angiography of the large and medium size vessels. No occlusion or correctable proximal stenosis. Some atherosclerotic irregularity in the carotid siphon regions without stenosis. Anatomic variations of hypoplastic A1 segment on the left, azygos anterior cerebral artery and fetal origin left PCA. Electronically Signed   By: Nelson Chimes M.D.   On: 04/24/2017 18:12    Medications:  I have reviewed the patient's current medications. Scheduled: . amLODipine  5 mg Oral Daily  . aspirin EC  81 mg Oral Daily  . calcium-vitamin D  1 tablet Oral Daily  . clopidogrel  75 mg Oral Daily  . docusate sodium  100 mg Oral BID  . enoxaparin  (LOVENOX) injection  40 mg Subcutaneous Q24H  . folic acid  1 mg Oral Daily  . levothyroxine  50 mcg Oral Daily  . pantoprazole  40 mg Oral Daily  . sodium chloride flush  3 mL Intravenous Q12H    Assessment/Plan: Patient weaker on LUE.  Likely maturation of lacunar infarct.  On ASA and Plavix.  LDL 131. A1c pending  Recommendations: 1.  Echocardiogram pending 2.  Statin for lipid management with target LDL<70. 3.  500cc bolus   LOS: 2 days   Alexis Goodell, MD Neurology [phone] 04/26/2017  11:17 AM

## 2017-04-26 NOTE — Progress Notes (Signed)
Physical Therapy Treatment Patient Details Name: Donna Rhodes MRN: [id number] DOB: 11/29/1946 Today's Date: 04/26/2017    History of Present Illness Patient is a 71 y.o. 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 Patient was at ED with negative CT/MRI earlier in day and returned 4 hours later with change in status. PMH includes HTN, hypercholestolemia, hypothyroidism, depression, and GERD.    PT Comments    Pt agreeable to PT; denies pain. Pt demonstrating poor range/strength at left ankle, good quad set and fair glut set. Assist for all left ankle range as well as hip abd/add and SLR. Pt demonstrates Mod of 2 for stand pivot transfer to/from bed/bedside commode with return to edge of bed. Education/practice on standing with weight shift to Left lower extremity for greater awareness. Pt improved with ability to take several small R side steps bed to chair with pivoting of left foot bed to chair. Pt/family given written home exercise program with education on providing assist, repetitions and frequency. Continue PT to progress range, strength and stand tolerance to improve all functional mobility.    Follow Up Recommendations  CIR     Equipment Recommendations       Recommendations for Other Services       Precautions / Restrictions Precautions Precautions: Fall Restrictions Weight Bearing Restrictions: No    Mobility  Bed Mobility Overal bed mobility: Needs Assistance Bed Mobility: Supine to Sit     Supine to sit: Min assist;Min guard     General bed mobility comments: Assist for LLE over edge of bed and Min guard for trunk to sit  Transfers Overall transfer level: Needs assistance Equipment used: None Transfers: Sit to/from [facility] Sit to Stand: Mod assist;+2 physical assistance Stand pivot transfers: Mod assist;+2 physical assistance       General transfer comment: Performed  bed to/from [facility]. STS performed again for short ambulation bed to chair  Ambulation/Gait Ambulation/Gait assistance: Mod assist;+2 physical assistance Ambulation Distance (Feet): 2 Feet Assistive device: None Gait Pattern/deviations:  (Small side step R; pivots L)     General Gait Details: Improvement following wb'g instruction on L to allow R steps versus just stand pivot transfer   Stairs            Wheelchair Mobility    Modified Rankin (Stroke Patients Only)       Balance Overall balance assessment: Needs assistance Sitting-balance support: Single extremity supported;Feet supported Sitting balance-Leahy Scale: Good     Standing balance support: Bilateral upper extremity supported;During functional activity Standing balance-Leahy Scale: Poor                              Cognition Arousal/Alertness: Awake/alert Behavior During Therapy: WFL for tasks assessed/performed;Flat affect Overall Cognitive Status: Within Functional Limits for tasks assessed                                        Exercises General Exercises - Lower Extremity Ankle Circles/Pumps: PROM;Left;20 reps (AROM R) Quad Sets: Strengthening;Both;20 reps (10x stand) Gluteal Sets: Strengthening;Both;20 reps Short Arc Quad: AROM;Strengthening;Both;20 reps Heel Slides: AROM;AAROM;Both;20 reps (very min A on L) Hip ABduction/ADduction: AROM;AAROM;Both;20 reps (assist on L to maintain LLE up) Straight Leg Raises: AAROM;Left;10 reps (2 sets; strengthening on R) Other Exercises Other Exercises: Stand wt shift to  L with 2 person assist Other Exercises: ankle inv/ever, clockwise and counterclockwise 20x each with assist    General Comments        Pertinent Vitals/Pain Pain Assessment: No/denies pain    Home Living                      Prior Function            PT Goals (current goals can now be found in the care plan section)      Frequency     7X/week      PT Plan Current plan remains appropriate    Co-evaluation              AM-PAC PT "6 Clicks" Daily Activity  Outcome Measure  Difficulty turning over in bed (including adjusting bedclothes, sheets and blankets)?: A Little Difficulty moving from lying on back to sitting on the side of the bed? : A Little Difficulty sitting down on and standing up from a chair with arms (e.g., wheelchair, bedside commode, etc,.)?: Total Help needed moving to and from a bed to chair (including a wheelchair)?: A Lot Help needed walking in hospital room?: Total Help needed climbing 3-5 steps with a railing? : Total 6 Click Score: 11    End of Session Equipment Utilized During Treatment: Gait belt Activity Tolerance: Patient tolerated treatment well;Patient limited by fatigue Patient left: in chair;with call bell/phone within reach;with chair alarm set;with family/visitor present   PT Visit Diagnosis: Muscle weakness (generalized) (M62.81);Difficulty in walking, not elsewhere classified (R26.2);Hemiplegia and hemiparesis Hemiplegia - Right/Left: Left Hemiplegia - caused by: Cerebral infarction     Time: 7209-4709 PT Time Calculation (min) (ACUTE ONLY): 57 min  Charges:  $Gait Training: 8-22 mins $Therapeutic Exercise: 23-37 mins $Therapeutic Activity: 8-22 mins                    G Codes:       Larae Grooms, PTA 04/26/2017, 1:49 PM

## 2017-04-26 NOTE — Progress Notes (Signed)
*  PRELIMINARY RESULTS* Echocardiogram 2D Echocardiogram has been performed.  [name] 04/26/2017, 4:13 PM

## 2017-04-26 NOTE — Care Management (Signed)
Spoke with daughter, would like inpatient acute rehabilitation.  Good support in the family. OT pending . Jodell Cipro, RN representative for Inpatient Acute Rehab. Updated. Shelbie Ammons RN MSN CCM Care Management [phone]

## 2017-04-27 ENCOUNTER — Encounter (HOSPITAL_COMMUNITY): Payer: Self-pay | Admitting: Physical Medicine and Rehabilitation

## 2017-04-27 ENCOUNTER — Inpatient Hospital Stay (HOSPITAL_COMMUNITY)
Admission: RE | Admit: 2017-04-27 | Discharge: 2017-05-19 | DRG: 091 | Disposition: A | Discharge status: Home Health | Payer: PPO | Source: Other Acute Inpatient Hospital | Attending: Physical Medicine & Rehabilitation | Admitting: Physical Medicine & Rehabilitation

## 2017-04-27 ENCOUNTER — Inpatient Hospital Stay (HOSPITAL_COMMUNITY): Payer: PPO

## 2017-04-27 ENCOUNTER — Telehealth: Payer: Self-pay | Admitting: Internal Medicine

## 2017-04-27 DIAGNOSIS — R21 Rash and other nonspecific skin eruption: Secondary | ICD-10-CM | POA: Diagnosis not present

## 2017-04-27 DIAGNOSIS — G8194 Hemiplegia, unspecified affecting left nondominant side: Secondary | ICD-10-CM

## 2017-04-27 DIAGNOSIS — I639 Cerebral infarction, unspecified: Secondary | ICD-10-CM | POA: Diagnosis not present

## 2017-04-27 DIAGNOSIS — M1990 Unspecified osteoarthritis, unspecified site: Secondary | ICD-10-CM | POA: Diagnosis not present

## 2017-04-27 DIAGNOSIS — K219 Gastro-esophageal reflux disease without esophagitis: Secondary | ICD-10-CM | POA: Diagnosis not present

## 2017-04-27 DIAGNOSIS — M069 Rheumatoid arthritis, unspecified: Secondary | ICD-10-CM | POA: Diagnosis not present

## 2017-04-27 DIAGNOSIS — B370 Candidal stomatitis: Secondary | ICD-10-CM | POA: Diagnosis not present

## 2017-04-27 DIAGNOSIS — R05 Cough: Secondary | ICD-10-CM | POA: Diagnosis not present

## 2017-04-27 DIAGNOSIS — E7800 Pure hypercholesterolemia, unspecified: Secondary | ICD-10-CM | POA: Diagnosis not present

## 2017-04-27 DIAGNOSIS — R911 Solitary pulmonary nodule: Secondary | ICD-10-CM | POA: Diagnosis not present

## 2017-04-27 DIAGNOSIS — R509 Fever, unspecified: Secondary | ICD-10-CM

## 2017-04-27 DIAGNOSIS — J189 Pneumonia, unspecified organism: Secondary | ICD-10-CM | POA: Diagnosis not present

## 2017-04-27 DIAGNOSIS — R2689 Other abnormalities of gait and mobility: Principal | ICD-10-CM | POA: Diagnosis present

## 2017-04-27 DIAGNOSIS — I10 Essential (primary) hypertension: Secondary | ICD-10-CM | POA: Diagnosis present

## 2017-04-27 DIAGNOSIS — F329 Major depressive disorder, single episode, unspecified: Secondary | ICD-10-CM

## 2017-04-27 DIAGNOSIS — I6381 Other cerebral infarction due to occlusion or stenosis of small artery: Secondary | ICD-10-CM | POA: Diagnosis present

## 2017-04-27 DIAGNOSIS — I69334 Monoplegia of upper limb following cerebral infarction affecting left non-dominant side: Secondary | ICD-10-CM

## 2017-04-27 DIAGNOSIS — E876 Hypokalemia: Secondary | ICD-10-CM

## 2017-04-27 DIAGNOSIS — M810 Age-related osteoporosis without current pathological fracture: Secondary | ICD-10-CM | POA: Diagnosis present

## 2017-04-27 DIAGNOSIS — F4323 Adjustment disorder with mixed anxiety and depressed mood: Secondary | ICD-10-CM

## 2017-04-27 DIAGNOSIS — Y95 Nosocomial condition: Secondary | ICD-10-CM | POA: Diagnosis not present

## 2017-04-27 DIAGNOSIS — I69392 Facial weakness following cerebral infarction: Secondary | ICD-10-CM

## 2017-04-27 DIAGNOSIS — R630 Anorexia: Secondary | ICD-10-CM | POA: Diagnosis not present

## 2017-04-27 DIAGNOSIS — B009 Herpesviral infection, unspecified: Secondary | ICD-10-CM | POA: Diagnosis not present

## 2017-04-27 DIAGNOSIS — Z780 Asymptomatic menopausal state: Secondary | ICD-10-CM

## 2017-04-27 DIAGNOSIS — E785 Hyperlipidemia, unspecified: Secondary | ICD-10-CM | POA: Diagnosis present

## 2017-04-27 DIAGNOSIS — Z87891 Personal history of nicotine dependence: Secondary | ICD-10-CM

## 2017-04-27 DIAGNOSIS — Z90710 Acquired absence of both cervix and uterus: Secondary | ICD-10-CM

## 2017-04-27 DIAGNOSIS — G3184 Mild cognitive impairment, so stated: Secondary | ICD-10-CM | POA: Diagnosis present

## 2017-04-27 DIAGNOSIS — Z888 Allergy status to other drugs, medicaments and biological substances status: Secondary | ICD-10-CM

## 2017-04-27 DIAGNOSIS — E039 Hypothyroidism, unspecified: Secondary | ICD-10-CM | POA: Diagnosis present

## 2017-04-27 DIAGNOSIS — K210 Gastro-esophageal reflux disease with esophagitis: Secondary | ICD-10-CM | POA: Diagnosis not present

## 2017-04-27 DIAGNOSIS — Z885 Allergy status to narcotic agent status: Secondary | ICD-10-CM | POA: Diagnosis not present

## 2017-04-27 DIAGNOSIS — Z8249 Family history of ischemic heart disease and other diseases of the circulatory system: Secondary | ICD-10-CM

## 2017-04-27 DIAGNOSIS — Z881 Allergy status to other antibiotic agents status: Secondary | ICD-10-CM | POA: Diagnosis not present

## 2017-04-27 DIAGNOSIS — G8320 Monoplegia of upper limb affecting unspecified side: Secondary | ICD-10-CM | POA: Diagnosis not present

## 2017-04-27 DIAGNOSIS — Z79899 Other long term (current) drug therapy: Secondary | ICD-10-CM

## 2017-04-27 DIAGNOSIS — M05741 Rheumatoid arthritis with rheumatoid factor of right hand without organ or systems involvement: Secondary | ICD-10-CM | POA: Diagnosis not present

## 2017-04-27 DIAGNOSIS — M05742 Rheumatoid arthritis with rheumatoid factor of left hand without organ or systems involvement: Secondary | ICD-10-CM | POA: Diagnosis not present

## 2017-04-27 DIAGNOSIS — E041 Nontoxic single thyroid nodule: Secondary | ICD-10-CM | POA: Diagnosis present

## 2017-04-27 DIAGNOSIS — R059 Cough, unspecified: Secondary | ICD-10-CM

## 2017-04-27 DIAGNOSIS — R058 Other specified cough: Secondary | ICD-10-CM

## 2017-04-27 DIAGNOSIS — R002 Palpitations: Secondary | ICD-10-CM | POA: Diagnosis not present

## 2017-04-27 DIAGNOSIS — Z9049 Acquired absence of other specified parts of digestive tract: Secondary | ICD-10-CM | POA: Diagnosis not present

## 2017-04-27 DIAGNOSIS — M0600 Rheumatoid arthritis without rheumatoid factor, unspecified site: Secondary | ICD-10-CM

## 2017-04-27 HISTORY — DX: Rheumatoid arthritis, unspecified: M06.9

## 2017-04-27 LAB — ECHOCARDIOGRAM COMPLETE
Height: 64 in
Weight: 2081.6 oz

## 2017-04-27 LAB — GLUCOSE, CAPILLARY: Glucose-Capillary: 100 mg/dL — ABNORMAL HIGH (ref 65–99)

## 2017-04-27 LAB — HEMOGLOBIN A1C
HEMOGLOBIN A1C: 5.7 % — AB (ref 4.8–5.6)
Mean Plasma Glucose: 117 mg/dL

## 2017-04-27 MED ORDER — FOLIC ACID 1 MG PO TABS
1.0000 mg | ORAL_TABLET | Freq: Every day | ORAL | Status: DC
  Administered 2017-04-28 – 2017-05-19 (×22): 1 mg via ORAL
  Filled 2017-04-27 (×22): qty 1

## 2017-04-27 MED ORDER — ACETAMINOPHEN 325 MG PO TABS
325.0000 mg | ORAL_TABLET | ORAL | Status: DC | PRN
  Administered 2017-04-29 – 2017-05-04 (×5): 650 mg via ORAL
  Filled 2017-04-27 (×6): qty 2

## 2017-04-27 MED ORDER — GUAIFENESIN-DM 100-10 MG/5ML PO SYRP: 5.0000 mL | ORAL_SOLUTION | Freq: Four times a day (QID) | ORAL | Status: DC | PRN

## 2017-04-27 MED ORDER — ONDANSETRON HCL 4 MG/2ML IJ SOLN: 4.0000 mg | Freq: Four times a day (QID) | INTRAMUSCULAR | Status: DC | PRN

## 2017-04-27 MED ORDER — POLYETHYLENE GLYCOL 3350 17 G PO PACK
17.0000 g | PACK | Freq: Every day | ORAL | Status: DC
  Administered 2017-04-28 – 2017-05-16 (×10): 17 g via ORAL
  Filled 2017-04-27 (×22): qty 1

## 2017-04-27 MED ORDER — HYDROCERIN EX CREA
TOPICAL_CREAM | Freq: Two times a day (BID) | CUTANEOUS | Status: DC
  Administered 2017-04-27 – 2017-04-30 (×7): via TOPICAL
  Administered 2017-05-02: 1 via TOPICAL
  Administered 2017-05-03 – 2017-05-12 (×16): via TOPICAL
  Administered 2017-05-12: 1 via TOPICAL
  Administered 2017-05-13 – 2017-05-19 (×12): via TOPICAL
  Filled 2017-04-27: qty 113

## 2017-04-27 MED ORDER — NYSTATIN 100000 UNIT/ML MT SUSP
5.0000 mL | Freq: Four times a day (QID) | OROMUCOSAL | Status: DC
  Administered 2017-04-27 – 2017-05-01 (×15): 500000 [IU] via ORAL
  Filled 2017-04-27 (×15): qty 5

## 2017-04-27 MED ORDER — ENOXAPARIN SODIUM 40 MG/0.4ML ~~LOC~~ SOLN
40.0000 mg | Freq: Every day | SUBCUTANEOUS | Status: DC
  Administered 2017-04-27 – 2017-05-18 (×22): 40 mg via SUBCUTANEOUS
  Filled 2017-04-27 (×22): qty 0.4

## 2017-04-27 MED ORDER — PROCHLORPERAZINE 25 MG RE SUPP: 12.5000 mg | Freq: Four times a day (QID) | RECTAL | Status: DC | PRN

## 2017-04-27 MED ORDER — ATORVASTATIN CALCIUM 40 MG PO TABS
40.0000 mg | ORAL_TABLET | Freq: Every day | ORAL | Status: DC
  Administered 2017-04-28 – 2017-05-18 (×21): 40 mg via ORAL
  Filled 2017-04-27 (×22): qty 1

## 2017-04-27 MED ORDER — FLEET ENEMA 7-19 GM/118ML RE ENEM: 1.0000 | ENEMA | Freq: Once | RECTAL | Status: DC | PRN

## 2017-04-27 MED ORDER — ATORVASTATIN CALCIUM 40 MG PO TABS: 40.0000 mg | ORAL_TABLET | Freq: Every day | ORAL | 1 refills | Status: DC

## 2017-04-27 MED ORDER — ALUM & MAG HYDROXIDE-SIMETH 200-200-20 MG/5ML PO SUSP: 30.0000 mL | ORAL | Status: DC | PRN

## 2017-04-27 MED ORDER — ONDANSETRON HCL 4 MG PO TABS: 4.0000 mg | ORAL_TABLET | Freq: Four times a day (QID) | ORAL | Status: DC | PRN

## 2017-04-27 MED ORDER — PROCHLORPERAZINE EDISYLATE 5 MG/ML IJ SOLN: 5.0000 mg | Freq: Four times a day (QID) | INTRAMUSCULAR | Status: DC | PRN

## 2017-04-27 MED ORDER — ENOXAPARIN SODIUM 40 MG/0.4ML ~~LOC~~ SOLN: 40.0000 mg | SUBCUTANEOUS | Status: DC

## 2017-04-27 MED ORDER — TRAZODONE HCL 50 MG PO TABS
25.0000 mg | ORAL_TABLET | Freq: Every evening | ORAL | Status: DC | PRN
  Administered 2017-04-27 – 2017-05-04 (×6): 50 mg via ORAL
  Filled 2017-04-27 (×7): qty 1

## 2017-04-27 MED ORDER — BISACODYL 10 MG RE SUPP: 10.0000 mg | Freq: Every day | RECTAL | Status: DC | PRN

## 2017-04-27 MED ORDER — BISACODYL 10 MG RE SUPP
10.0000 mg | Freq: Every day | RECTAL | Status: DC | PRN
Start: 2017-04-27 — End: 2017-05-19
  Administered 2017-04-29 – 2017-05-17 (×4): 10 mg via RECTAL
  Filled 2017-04-27 (×4): qty 1

## 2017-04-27 MED ORDER — AMLODIPINE BESYLATE 5 MG PO TABS
5.0000 mg | ORAL_TABLET | Freq: Every day | ORAL | Status: DC
  Administered 2017-04-28 – 2017-05-19 (×22): 5 mg via ORAL
  Filled 2017-04-27 (×22): qty 1

## 2017-04-27 MED ORDER — CALCIUM CARBONATE-VITAMIN D 500-200 MG-UNIT PO TABS
1.0000 | ORAL_TABLET | Freq: Every day | ORAL | Status: DC
  Administered 2017-04-28 – 2017-05-19 (×22): 1 via ORAL
  Filled 2017-04-27 (×22): qty 1

## 2017-04-27 MED ORDER — DIPHENHYDRAMINE HCL 12.5 MG/5ML PO ELIX
12.5000 mg | ORAL_SOLUTION | Freq: Four times a day (QID) | ORAL | Status: DC | PRN
  Administered 2017-05-07 – 2017-05-08 (×2): 25 mg via ORAL
  Filled 2017-04-27 (×2): qty 10

## 2017-04-27 MED ORDER — CLOPIDOGREL BISULFATE 75 MG PO TABS
75.0000 mg | ORAL_TABLET | Freq: Every day | ORAL | Status: DC
  Administered 2017-04-28 – 2017-05-19 (×22): 75 mg via ORAL
  Filled 2017-04-27 (×22): qty 1

## 2017-04-27 MED ORDER — ENSURE ENLIVE PO LIQD
237.0000 mL | Freq: Two times a day (BID) | ORAL | Status: DC
  Administered 2017-04-28 – 2017-05-08 (×12): 237 mL via ORAL

## 2017-04-27 MED ORDER — POLYETHYLENE GLYCOL 3350 17 G PO PACK: 17.0000 g | PACK | Freq: Every day | ORAL | Status: DC | PRN

## 2017-04-27 MED ORDER — PANTOPRAZOLE SODIUM 40 MG PO TBEC
40.0000 mg | DELAYED_RELEASE_TABLET | Freq: Every day | ORAL | Status: DC
  Administered 2017-04-28 – 2017-05-19 (×22): 40 mg via ORAL
  Filled 2017-04-27 (×22): qty 1

## 2017-04-27 MED ORDER — ASPIRIN EC 81 MG PO TBEC
81.0000 mg | DELAYED_RELEASE_TABLET | Freq: Every day | ORAL | Status: DC
  Administered 2017-04-28 – 2017-05-19 (×22): 81 mg via ORAL
  Filled 2017-04-27 (×22): qty 1

## 2017-04-27 MED ORDER — PROCHLORPERAZINE MALEATE 5 MG PO TABS
5.0000 mg | ORAL_TABLET | Freq: Four times a day (QID) | ORAL | Status: DC | PRN
  Administered 2017-05-02: 5 mg via ORAL
  Administered 2017-05-09: 10 mg via ORAL
  Filled 2017-04-27: qty 1
  Filled 2017-04-27: qty 2

## 2017-04-27 MED ORDER — DOCUSATE SODIUM 100 MG PO CAPS
100.0000 mg | ORAL_CAPSULE | Freq: Two times a day (BID) | ORAL | Status: DC
  Administered 2017-04-27 – 2017-05-18 (×33): 100 mg via ORAL
  Filled 2017-04-27 (×43): qty 1

## 2017-04-27 MED ORDER — CLOPIDOGREL BISULFATE 75 MG PO TABS: 75.0000 mg | ORAL_TABLET | Freq: Every day | ORAL | 1 refills | Status: DC

## 2017-04-27 MED ORDER — LEVOTHYROXINE SODIUM 25 MCG PO TABS
50.0000 ug | ORAL_TABLET | Freq: Every day | ORAL | Status: DC
  Administered 2017-04-28 – 2017-05-19 (×22): 50 ug via ORAL
  Filled 2017-04-27: qty 2
  Filled 2017-04-27: qty 1
  Filled 2017-04-27 (×2): qty 2
  Filled 2017-04-27 (×3): qty 1
  Filled 2017-04-27: qty 2
  Filled 2017-04-27: qty 1
  Filled 2017-04-27 (×7): qty 2
  Filled 2017-04-27 (×2): qty 1
  Filled 2017-04-27 (×2): qty 2
  Filled 2017-04-27: qty 1
  Filled 2017-04-27 (×2): qty 2

## 2017-04-27 MED ORDER — ASPIRIN 81 MG PO TBEC: 81.0000 mg | DELAYED_RELEASE_TABLET | Freq: Every day | ORAL | 1 refills | Status: DC

## 2017-04-27 NOTE — H&P (Signed)
Physical Medicine and Rehabilitation Admission H&P    Chief Complaint  Patient presents with  . Left sided weakness, left facial weakness and slurred speech.     HPI:   Donna Rhodes is an 71 y.o. LH female with history of HTN, GERD, RA, lung nodule who was admitted to [facility] on 04/24/17 with multiple falls and MRI brain without acute findings and cervical spondylosis with central protrusion at C4/5. She was sent home and started developing left sided weakness with left facial weakness and slurred speech.  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 MRA brain/neck was negative for occlusion or stenosis. 2D echo with EF 50-55% and grade 1 diastolic dysfunction. Stroke felt to be due to small vessel disease and Dr. Doy Mince recommended low dose ASA and plavix for secondary stroke prevention. She did have increase in weakness LUE on 4/30 and was treated with fluid bolus. She was started on dysphagia 3, thin liquids with aspiration precautions and educated on OME. Patient with resultant left sided weakness, right lean and decreased in ability to WB on LLE. She has had significant decline in mobility and ability to carry out ADL tasks. CIR recommended for follow up therapy.     Review of Systems  HENT: Negative for hearing loss and tinnitus.   Eyes: Negative for blurred vision and double vision.  Respiratory: Positive for wheezing. Negative for hemoptysis and shortness of breath.   Cardiovascular: Negative for chest pain, claudication and leg swelling.  Gastrointestinal: Positive for constipation. Negative for heartburn.  Genitourinary: Negative for dysuria and urgency.  Musculoskeletal: Positive for back pain and joint pain (wrists--worse left thumb. Neck pain due to DDD.  ). Negative for myalgias.  Skin: Negative for itching and rash.  Neurological: Positive for speech change and focal weakness. Negative for  dizziness, sensory change, weakness and headaches.  Psychiatric/Behavioral: Negative for memory loss and suicidal ideas. The patient is nervous/anxious.       Past Medical History:  Diagnosis Date  . Atrophic vaginitis   . Dysplasia of vagina    vin 1 - vulva and vaginal cuff  . GERD (gastroesophageal reflux disease)   . HCV infection   . HSV (herpes simplex virus) infection   . Hypercholesterolemia   . Hypertension   . Hypothyroidism   . Lung nodule    stable  . Menopausal state   . Osteoarthritis    hand involvement, cervical disc disease  . Osteoporosis   . Pelvic pain in female   . Thyroid nodule   . Vaginal Pap smear, abnormal    lgsil    Past Surgical History:  Procedure Laterality Date  . ADENOIDECTOMY    . APPENDECTOMY  1965  . [place]   left  . CHOLECYSTECTOMY    . [place]  . laser vein surgery    . MANDIBLE RECONSTRUCTION  1992  . TOENAIL EXCISION  1998   several  . [place]  . VAGINAL HYSTERECTOMY  1983   als0- anterior/posterior colporrhaphy  . VAGINAL WOUND CLOSURE / REPAIR  1999, 2000  . VAGINAL WOUND CLOSURE / REPAIR      Family History  Problem Relation Age of Onset  . Hypertension Father   . Osteoarthritis Mother   . Arthritis/Rheumatoid Sister   . Breast cancer      paternal side  . Breast cancer Paternal Aunt   .  Breast cancer Cousin     maternal  . Uterine cancer Maternal Grandmother   . Ovarian cancer Neg Hx   . Heart disease Neg Hx   . Diabetes Neg Hx   . Colon cancer Neg Hx     Social History:   Divorced. Daughter lives with her. She reports that she quit smoking about 37 years ago--smoked for 19 years. 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 She has never used smokeless tobacco. She reports that she does not drink alcohol or use drugs.     Allergies  Allergen Reactions  . Amoxicillin Other (See Comments)    Irritated tongue  . Avelox [Moxifloxacin Hcl In Nacl]   .  Azithromycin Other (See Comments)    Caused mouth and tongue to be sore  . Codeine   . Estroven Weight Management [Nutritional Supplements]     Mouth soreness  . Moxifloxacin Other (See Comments)  . Naproxen     Medications Prior to Admission  Medication Sig Dispense Refill  . amLODipine (NORVASC) 5 MG tablet Take 1 tablet (5 mg total) by mouth daily. 90 tablet 1  . esomeprazole (NEXIUM) 40 MG capsule Take 1 capsule (40 mg total) by mouth daily. 30 capsule 2  . estradiol (ESTRACE VAGINAL) 0.1 MG/GM vaginal cream Place 1 Applicatorful vaginally at bedtime.    . folic acid (FOLVITE) 1 MG tablet Take 1 mg by mouth daily.    Marland Kitchen levothyroxine (SYNTHROID, LEVOTHROID) 50 MCG tablet Take 1 tablet (50 mcg total) by mouth daily. 90 tablet 0  . methotrexate 50 MG/2ML injection Inject 0.35mL under the skin once a week.    Marland Kitchen acyclovir (ZOVIRAX) 200 MG capsule Take 1 capsule (200 mg total) by mouth 2 (two) times daily. (Patient not taking: Reported on 04/24/2017) 180 capsule 3  . calcium-vitamin D (OSCAL WITH D) 250-125 MG-UNIT tablet Take 1 tablet by mouth daily.    Marland Kitchen estradiol (ESTRACE) 1 MG tablet Take 0.5 tablets (0.5 mg total) by mouth daily. (Patient not taking: Reported on 04/24/2017) 90 tablet 3    Home: [place] expects to be discharged to:: Private residence Living Arrangements: Alone Available Help at Discharge: Family, Available PRN/intermittently Type of Home: House Home Access: Stairs to enter Technical brewer of Steps: 4 Entrance Stairs-Rails: Can reach both Home Layout: One level Bathroom Shower/Tub: Tub/shower unit, Architectural technologist: Standard Home Equipment: None   Functional History: Prior Function Level of Independence: Independent Comments: Patient previously independent and was helping take care of her 43 year old mother, mow the grass, till dirt for flower planting, etc.  Functional Status:  Mobility: Bed Mobility Overal bed mobility:  Needs Assistance Bed Mobility: Supine to Sit Supine to sit: Min assist, Min guard Sit to supine: Min assist General bed mobility comments: Assist for LLE over edge of bed and Min guard for trunk to sit Transfers Overall transfer level: Needs assistance Equipment used: None Transfers: Sit to/from Stand, [employer] Transfers Sit to Stand: Mod assist, +2 physical assistance Stand pivot transfers: Mod assist, +2 physical assistance Squat pivot transfers: Max assist General transfer comment: Performed bed to/from [facility]. STS performed again for short ambulation bed to chair Ambulation/Gait Ambulation/Gait assistance: Mod assist, +2 physical assistance Ambulation Distance (Feet): 2 Feet Assistive device: None Gait Pattern/deviations:  (Small side step R; pivots L) General Gait Details: Improvement following wb'g instruction on L to allow R steps versus just stand pivot transfer    ADL: ADL Overall ADL's : Needs assistance/impaired  Eating/Feeding: Set up, Minimal assistance Eating/Feeding Details (indicate cue type and reason): using R hand which is non dominant; rec dycem and rocker knife Grooming: Wash/dry hands, Wash/dry face, Oral care, Brushing hair, Set up, Minimal assistance Grooming Details (indicate cue type and reason): pt doing well with trying to use L hand to help stabilize for ADLs like feeding and grooming skills Upper Body Dressing : Moderate assistance, Set up Upper Body Dressing Details (indicate cue type and reason): cue for one handed tech and to dress L side first Lower Body Dressing: Maximal assistance, Set up Lower Body Dressing Details (indicate cue type and reason): tends to lean to R and decreased movement in L ankle and toes; max assist to stand to pull pants over hips  Cognition: Cognition Overall Cognitive Status: Within Functional Limits for tasks assessed Orientation Level: Oriented X4 Cognition Arousal/Alertness: Awake/alert Behavior During Therapy: WFL  for tasks assessed/performed, Flat affect Overall Cognitive Status: Within Functional Limits for tasks assessed General Comments: Pt crying during evaluation but did well with encouragement and redirection   Blood pressure 114/81, pulse 81, temperature 97.8 F (36.6 C), temperature source Oral, resp. rate 16, height 5\' 4"  (1.626 m), weight 60.8 kg (134 lb 1.6 oz), last menstrual period 12/29/1981, SpO2 97 %. Physical Exam  Nursing note and vitals reviewed. Constitutional: She is oriented to person, place, and time. She appears well-developed and well-nourished.  HENT:  Head: Normocephalic and atraumatic.  Mouth/Throat: Oropharynx is clear and moist.  Eyes: Conjunctivae are normal. Pupils are equal, round, and reactive to light.  Neck: Normal range of motion. Neck supple.  Cardiovascular: Normal rate, regular rhythm and intact distal pulses.   Respiratory: Effort normal. No stridor. No respiratory distress. She has no wheezes. She has rhonchi. She has rales in the right middle field and the right lower field.  GI: Soft. Bowel sounds are normal. She exhibits no distension. There is no tenderness.  Musculoskeletal: She exhibits tenderness (discomfort with left thumb and wrist movement). She exhibits no edema.  Neurological: She is alert and oriented to person, place, and time. A cranial nerve deficit is present.  Left facial weakness. Speech slow and fatigued appearing. Processing delayed.  Left MMT:  tr/5 deltoid, tr/5 bicep, tr/5 tricep, 0/5 wrist extension, 0/5 hand intrinsics.      2/5 hip flexor, 3/5 knee extension, 1/5 ankle dorsiflexion, 2/5 ankle plantarflexion. Right: 5/5 prox to distal. Sensation intact to LT and pain left arm and leg.   Skin: Skin is warm and dry.  Bruise on left lateral buttock and right elbow.   Psychiatric: Her affect is blunt. She is withdrawn. Cognition and memory are normal.    Results for orders placed or performed during the hospital encounter of 04/24/17  (from the past 48 hour(s))  Lipid panel     Status: Abnormal   Collection Time: 04/26/17  4:30 AM  Result Value Ref Range   Cholesterol 203 (H) 0 - 200 mg/dL   Triglycerides 88 <150 mg/dL   HDL 54 >40 mg/dL   Total CHOL/HDL Ratio 3.8 RATIO   VLDL 18 0 - 40 mg/dL   LDL Cholesterol 131 (H) 0 - 99 mg/dL    Comment:        Total Cholesterol/HDL:CHD Risk Coronary Heart Disease Risk Table                     Men   Women  1/2 Average Risk   3.4   3.3  Average Risk  5.0   4.4  2 X Average Risk   9.6   7.1  3 X Average Risk  23.4   11.0        Use the calculated Patient Ratio above and the CHD Risk Table to determine the patient's CHD Risk.        ATP III CLASSIFICATION (LDL):  <100     mg/dL   Optimal  100-129  mg/dL   Near or Above                    Optimal  130-159  mg/dL   Borderline  160-189  mg/dL   High  >190     mg/dL   Very High   Glucose, capillary     Status: Abnormal   Collection Time: 04/26/17  8:23 AM  Result Value Ref Range   Glucose-Capillary 156 (H) 65 - 99 mg/dL  Glucose, capillary     Status: Abnormal   Collection Time: 04/27/17  7:28 AM  Result Value Ref Range   Glucose-Capillary 100 (H) 65 - 99 mg/dL   No results found.     Medical Problem List and Plan: 1.  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  DVT Prophylaxis/Anticoagulation: Pharmaceutical: Lovenox 3. Pain Management: tylenol prn 4. Mood: Seems flat--question fatigue. Team to provide ego support. LCSW to follow for evaluation and support.   -neuropsych for mood assessment 5. Neuropsych: This patient is capable of making decisions on her own behalf. 6. Skin/Wound Care: Monitor I/O 7. Fluids/Electrolytes/Nutrition: Monitor I/O. Check lytes in am.  8. HTN: [place] BP bid. On Norvasc  daily 9. Dyslipidemia:  Continue Lipitor.  10. Hypothyroid: On supplement 11. RA: Managed with methotrexate injections weekly.  12. Anorexia: Will  liberalize diet to regular to help with food choices.  13. Persistent productive cough, ?recent aspiration, right lower-mid rales on exam  -IS  -check CXR    Post Admission Physician Evaluation: 1. Functional deficits secondary  to right basal ganglia and PLIC infarct. 2. Patient is admitted to receive collaborative, interdisciplinary care between the physiatrist, rehab nursing staff, and therapy team. 3. Patient's level of medical complexity and substantial therapy needs in context of that medical necessity cannot be provided at a lesser intensity of care such as a SNF. 4. Patient has experienced substantial functional loss from his/her baseline which was documented above under the "Functional History" and "Functional Status" headings.  Judging by the patient's diagnosis, physical exam, and functional history, the patient has potential for functional progress which will result in measurable gains while on inpatient rehab.  These gains will be of substantial and practical use upon discharge  in facilitating mobility and self-care at the household level. 5. Physiatrist will provide 24 hour management of medical needs as well as oversight of the therapy plan/treatment and provide guidance as appropriate regarding the interaction of the two. 6. The Preadmission Screening has been reviewed and patient status is unchanged unless otherwise stated above. 7. 24 hour rehab nursing will assist with bladder management, bowel management, safety, skin/wound care, disease management, medication administration, pain management and patient education  and help integrate therapy concepts, techniques,education, etc. 8. PT will assess and treat for/with: Lower extremity strength, range of motion, stamina, balance, functional mobility, safety, adaptive techniques and equipment, NMR, family education, community reintegration.   Goals are: mod I to supervision. 9. OT will assess and treat for/with: ADL's, functional  mobility, safety, upper extremity strength, adaptive  techniques and equipment, NMR, family ed.   Goals are: mod I to min assist. Therapy may proceed with showering this patient. 10. SLP will assess and treat for/with: cognition, communication.  Goals are: mod I. 11. Case Management and Social Worker will assess and treat for psychological issues and discharge planning. 12. Team conference will be held weekly to assess progress toward goals and to determine barriers to discharge. 13. Patient will receive at least 3 hours of therapy per day at least 5 days per week. 14. ELOS: 16-21 days       15. Prognosis:  excellent     Meredith Staggers, MD, [place] [facility] 04/27/2017  Bary Leriche, Hershal Coria 04/27/2017

## 2017-04-27 NOTE — Clinical Social Work Note (Signed)
CSW received referral for SNF.  Patient has been accepted to [place], CSW to sign off please re-consult if social work needs arise.  Jones Broom. [place], MSW, [place]

## 2017-04-27 NOTE — Progress Notes (Signed)
Meredith Staggers, MD Physician Signed Physical Medicine and Rehabilitation  PMR Pre-admission Date of Service: 04/27/2017 11:40 AM  Related encounter: ED to Hosp-Admission (Discharged) from 04/24/2017 in [facility] (1C)       '[]' Hide copied text   [employer] PMR Admission Coordinator Pre-Admission Assessment  Patient: Donna Rhodes is an 71 y.o., female MRN: [id number] DOB: [birth year] Height: '5\' 4"'  (162.6 cm) Weight: 60.8 kg (134 lb 1.6 oz)  Insurance Information HMO:      PPO:  Yes     PCP:       IPA:       80/20:       OTHER:  Group # [id number] PRIMARY:  [employer]      Policy#: [id number]      Subscriber:  [name] CM Name: Janey Genta     Phone#:                          Fax#:  Has EPIC access Pre-Cert#: [id number] for 7 days with review due on 05/02/17      Employer:  Retired Benefits:  Phone #: [phone]     Name:  Emmit Pomfret. Date: 12/29/15     Deduct:  $0      Out of Pocket Max:  $3400 (met $0)      Life Max:  unlimited CIR: $250 days 1-6      SNF: $0 days 1-20; $150 days 21-100 Outpatient:  Med necessity     Co-Pay: $15/visit Home Health: Med necessity      Co-Pay: $25/visit DME: 80%     Co-Pay: 20% Providers: in network  Medicaid Application Date:        Case Manager:   Disability Application Date:        Case Worker:    Emergency Tax adviser Information    Name Relation Home Work Mobile   [place] Daughter [phone]     Barnet Pall Daughter   [phone]      Current Medical History  Patient Admitting Diagnosis:  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 She in ER earlier today, 04/24/17, with weakness which resolved. CT and MRI negative at that time. Went home and developed worsening left-sided symptoms.  Patient returned to ED and now has flaccid LUE and left facial droop and was admitted 04/24/17.  Repeat MRI with acute infarction in the right basal ganglia/ posterior limb internal capsule. No evidence of hemorrhage or mass effect. PT/OT/SLP evaluations were completed with recommendations for acute inpatient rehab admission.  Patient passed swallow evaluation and started on heart healthy, thin liquids diet.  Patient's medical record from  [facility] has been reviewed by the rehabilitation admission coordinator and physician.  NIH Stroke scale: 6  Past Medical History      Past Medical History:  Diagnosis Date  . Atrophic vaginitis   . Dysplasia of vagina    vin 1 - vulva and vaginal cuff  . GERD (gastroesophageal reflux disease)   . HCV infection   . HSV (herpes simplex virus) infection   . Hypercholesterolemia   . Hypertension   . Hypothyroidism   . Lung nodule    stable  . Menopausal state   . Osteoarthritis    hand involvement, cervical  disc disease  . Osteoporosis   . Pelvic pain in female   . Thyroid nodule   . Vaginal Pap smear, abnormal    lgsil    Family History   family history includes Arthritis/Rheumatoid in her sister; Breast cancer in her cousin and paternal aunt; Hypertension in her father; Osteoarthritis in her mother; Uterine cancer in her maternal grandmother.  Prior Rehab/Hospitalizations Has the patient had major surgery during 100 days prior to admission? No              Current Medications  See MAR from [facility]  Patients Current Diet:   Heart healthy, thin liquids  Precautions / Restrictions Precautions Precautions: Fall Restrictions Weight Bearing Restrictions: No   Has the patient had 2 or more falls or a fall with injury in the past year?Yes  Patient has suffered one fall at the time of stroke with left head laceration and left hip bruising.  Prior Activity  Level Community (5-7x/wk): Went out daily, was driving, very active.  Prior Functional Level Self Care: Did the patient need help bathing, dressing, using the toilet or eating?  Independent  Indoor Mobility: Did the patient need assistance with walking from room to room (with or without device)? Independent  Stairs: Did the patient need assistance with internal or external stairs (with or without device)? Independent  Functional Cognition: Did the patient need help planning regular tasks such as shopping or remembering to take medications? Independent  Home Assistive Devices / Equipment Home Assistive Devices/Equipment: None Home Equipment: None  Prior Device Use: Indicate devices/aids used by the patient prior to current illness, exacerbation or injury? None   Prior Functional Level Current Functional Level  Bed Mobility  Independent  Min assist   Transfers  Independent  Mod assist   Mobility - Walk/Wheelchair  Independent  Mod assist (Ambulated 2 feet no device)   Upper Body Dressing  Independent  Mod assist   Lower Body Dressing  Independent  Max assist   Grooming  Independent  Min assist   Eating/Drinking  Independent  Min assist   Toilet Transfer  Independent  Mod assist   Bladder Continence   WDL  Using [facility]   Bowel Management  WDL  No BM since 04/23/17   Stair Climbing  Independent Other (Not tried)   Counsellor and intact  Mild impairment   Memory  WDL  alert and oriented   Cooking/Meal Prep  independent      Housework  independent    Money Management  independent    Driving Yes, Independent     Special needs/care consideration BiPAP/CPAP No CPM No Continuous Drip IV No Dialysis No        Life Vest No Oxygen no Special Bed No Trach Size No Wound Vac (area) No      Skin: Dressing left head and left hip bruising                           Bowel mgmt:  Last BM 04/23/17 Bladder mgmt: Voiding up on [facility] with assistance Diabetic mgmt No  Previous Home Environment Living Arrangements: Alone Available Help at Discharge: Family, Available PRN/intermittently Type of Home: House Home Layout: One level Home Access: Stairs to enter Entrance Stairs-Rails: Can reach both Entrance Stairs-Number of Steps: 4 Bathroom Shower/Tub: Tub/shower unit, Architectural technologist: Standard Home Care Services: No  Discharge Living Setting Plans for Discharge Living Setting: House, Lives with (  comment) (Lives with youngest daughter.) Type of Home at Discharge: House Discharge Home Layout: One level Discharge Home Access: Stairs to enter Entrance Stairs-Number of Steps: 3 steps to porch and then 1 step into house Does the patient have any problems obtaining your medications?: No  Social/Family/Support Systems Patient Roles: Parent (Has 3 daughters.) Contact Information: Keturah Barre - daughter - [phone] Anticipated Caregiver: Daughters X 2 Anticipated Caregiver's Contact Information: Barnet Pall - daughter - [phone] Ability/Limitations of Caregiver: Youngest dtr not reliable.  Two older daughters plan to share caregiver duties after discharge. Caregiver Availability: 24/7 Discharge Plan Discussed with Primary Caregiver: Yes (Spoke with Amy Clontz) Is Caregiver In Agreement with Plan?: Yes Does Caregiver/Family have Issues with Lodging/Transportation while Pt is in Rehab?: No  Goals/Additional Needs Patient/Family Goal for Rehab: PT/OT supervision to min assist, SLP mod I and supervision goals Expected length of stay: 10-14 days Cultural Considerations: None Dietary Needs: Heart healthy, thin liquids diet Equipment Needs: TBD Pt/Family Agrees to Admission and willing to participate: Yes (Spoke with daughter, Amy.) Program Orientation Provided & Reviewed with Pt/Caregiver Including Roles  & Responsibilities: Yes  Patient Condition: Patient  has suffered a right CVA.  I spoke with patient and her daughter and I have reviewed all progress and medical records.  Patient can tolerate and will benefit from 3 hours of therapy a day in the coordinated setting of inpatient rehab.  Patient was very active and independent prior to admission.  Her daughters are supportive and plan to provide care after rehab discharge.  I have discussed and reviewed all findings with rehab MD and have approval for acute inpatient rehab admission for today.  Preadmission Screen Completed By:  Retta Diones, 04/27/2017 12:03 PM ______________________________________________________________________   Discussed status with Dr. Naaman Plummer on 04/27/17 at 1153 and received telephone approval for admission today.  Admission Coordinator:  Retta Diones, time 1203/Date 04/27/17   Assessment/Plan: Diagnosis: RIGHT BASAL GANGLIA/PLIC INFARCT 1. Does the need for close, 24 hr/day  Medical supervision in concert with the patient's rehab needs make it unreasonable for this patient to be served in a less intensive setting? Yes 2. Co-Morbidities requiring supervision/potential complications: htn, hsv, gerd 3. Due to bladder management, bowel management, safety, skin/wound care, disease management, medication administration, pain management and patient education, does the patient require 24 hr/day rehab nursing? Yes 4. Does the patient require coordinated care of a physician, rehab nurse, PT (1-2 hrs/day, 5 days/week), OT (1-2 hrs/day, 5 days/week) and SLP (1-2 hrs/day, 5 days/week) to address physical and functional deficits in the context of the above medical diagnosis(es)? Yes Addressing deficits in the following areas: balance, endurance, locomotion, strength, transferring, bowel/bladder control, bathing, dressing, feeding, grooming, toileting, cognition, speech and psychosocial support 5. Can the patient actively participate in an intensive therapy program of at least 3 hrs of  therapy 5 days a week? Yes 6. The potential for patient to make measurable gains while on inpatient rehab is excellent 7. Anticipated functional outcomes upon discharge from inpatients are: supervision and min assist PT, supervision and min assist OT, modified independent and supervision SLP 8. Estimated rehab length of stay to reach the above functional goals is: 10-14 days 9. Does the patient have adequate social supports to accommodate these discharge functional goals? Yes 10. Anticipated D/C setting: Home 11. Anticipated post D/C treatments: HH therapy and Outpatient therapy 12. Overall Rehab/Functional Prognosis: excellent    RECOMMENDATIONS: This patient's condition is appropriate for continued rehabilitative care in the following setting: CIR Patient  has agreed to participate in recommended program. Yes Note that insurance prior authorization may be required for reimbursement for recommended care.  Comment: admit to inpatient rehab today  Meredith Staggers, MD, [facility] 04/27/2017   Retta Diones 04/27/2017    Revision History

## 2017-04-27 NOTE — Care Management (Signed)
Accepted at [facility] with insurance authorization per Jodell Cipro RN representative for this unit. Transportation per [employer]. Room [address]. Dr. Letta Pate is accepting physician. Report Hayden RN MSN CCM Care Management [phone]

## 2017-04-27 NOTE — Plan of Care (Signed)
MD making rounds. Received order to discharge to [facility]. IV removed. Prescriptions E-Scribed to pharmacy. Provided with education handouts. Discharge paperwork provided, explained, signed and witnessed. No unanswered questions.   Report called to Pryor Montes, Therapist, sports at [facility]. No unanswered questions.  Belongings sent with daughter. [facility] on Unit for transport. Discharge packet given to [facility] transporter.

## 2017-04-27 NOTE — Telephone Encounter (Signed)
[facility] called and needs to make a 2 week HFU for pt. Pt was in for CVA. Please advise, thank you!  Call pt @ [phone]  Call [facility] @ [phone]

## 2017-04-27 NOTE — Discharge Summary (Signed)
[facility] [place] NAME: Donna Rhodes    MR#:  [id number]  DATE OF BIRTH:  26-Apr-1946  DATE OF ADMISSION:  04/24/2017 ADMITTING PHYSICIAN: Donna Crouch, MD  DATE OF DISCHARGE: 04/27/2017  PRIMARY CARE PHYSICIAN: Donna Pheasant, MD    ADMISSION DIAGNOSIS:  CVA (cerebral vascular accident) ([place]) [I63.9] Left leg weakness [R29.898] Left arm weakness [R29.898]  DISCHARGE DIAGNOSIS:  Acute Right Basal Ganglia CVA  SECONDARY DIAGNOSIS:   Past Medical History:  Diagnosis Date  . Atrophic vaginitis   . Dysplasia of vagina    vin 1 - vulva and vaginal cuff  . GERD (gastroesophageal reflux disease)   . HCV infection   . HSV (herpes simplex virus) infection   . Hypercholesterolemia   . Hypertension   . Hypothyroidism   . Lung nodule    stable  . Menopausal state   . Osteoarthritis    hand involvement, cervical disc disease  . Osteoporosis   . Pelvic pain in female   . Thyroid nodule   . Vaginal Pap smear, abnormal    lgsil    HOSPITAL COURSE:   *Donna Rhodes a 71 y.o.femalehas a past medical history significant for HTN, [name], [name], [name]  1.Acute right basal ganglia/pontine CVA (cerebral vascular accident) ([place]) -Positive MRI -Continue Plavix, Aspirin, statins -PT OT-recommends inpatient rehabilitation--awaiting bed and insurance authorization -Care management for discharge planning -MRI neck negative for stenosis -Echo EF 55% -Appreciate neurology input  2. Essential hypertension allow permissive hypertension  3. Hypothyroidism Synthroid  4.Hypercholesterolemia Statins  Discussed with patient's daughter in the room. Questions answered Inpatient  rehabilitation bed. Patient will discharge once bed available  CONSULTS OBTAINED:  Treatment Team:  Donna Goodell, MD  DRUG ALLERGIES:   Allergies  Allergen Reactions  . Amoxicillin Other (See Comments)    Irritated tongue  . Avelox [Moxifloxacin Hcl In Nacl]   . Azithromycin Other (See Comments)    Caused mouth and tongue to be sore  . Codeine   . [place]roven Weight Management [Nutritional Supplements]     Mouth soreness  . Moxifloxacin Other (See Comments)  . Naproxen     DISCHARGE MEDICATIONS:   Current Discharge Medication List    START taking these medications   Details  aspirin EC 81 MG EC tablet Take 1 tablet (81 mg total) by mouth daily. Qty: 30 tablet, Refills: 1    atorvastatin (LIPITOR) 40 MG tablet Take 1 tablet (40 mg total) by mouth daily at 6 PM. Qty: 30 tablet, Refills: 1    clopidogrel (PLAVIX) 75 MG tablet Take 1 tablet (75 mg total) by mouth daily. Qty: 30 tablet, Refills: 1      CONTINUE these medications which have NOT CHANGED   Details  amLODipine (NORVASC) 5 MG tablet Take 1 tablet (5 mg total) by mouth daily. Qty: 90 tablet, Refills: 1    esomeprazole (NEXIUM) 40 MG capsule Take 1 capsule (40 mg total) by mouth daily. Qty: 30 capsule, Refills: 2    estradiol (ESTRACE VAGINAL) 0.1 MG/GM vaginal cream Place 1 Applicatorful vaginally at bedtime.    folic acid (FOLVITE) 1 MG tablet Take 1 mg by mouth daily.    levothyroxine (SYNTHROID, LEVOTHROID) 50 MCG tablet Take 1  tablet (50 mcg total) by mouth daily. Qty: 90 tablet, Refills: 0    methotrexate 50 MG/2ML injection Inject 0.7mL under the skin once a week.    calcium-vitamin D (OSCAL WITH D) 250-125 MG-UNIT tablet Take 1 tablet by mouth daily.      STOP taking these medications     acyclovir (ZOVIRAX) 200 MG capsule      estradiol (ESTRACE) 1 MG tablet         If you experience worsening of your admission symptoms, develop shortness of breath, life threatening emergency, suicidal  or homicidal thoughts you must seek medical attention immediately by calling 911 or calling your MD immediately  if symptoms less severe.  You Must read complete instructions/literature along with all the possible adverse reactions/side effects for all the Medicines you take and that have been prescribed to you. Take any new Medicines after you have completely understood and accept all the possible adverse reactions/side effects.   Please note  You were cared for by a hospitalist during your hospital stay. If you have any questions about your discharge medications or the care you received while you were in the hospital after you are discharged, you can call the unit and asked to speak with the hospitalist on call if the hospitalist that took care of you is not available. Once you are discharged, your primary care physician will handle any further medical issues. Please note that NO REFILLS for any discharge medications will be authorized once you are discharged, as it is imperative that you return to your primary care physician (or establish a relationship with a primary care physician if you do not have one) for your aftercare needs so that they can reassess your need for medications and monitor your lab values. Today   SUBJECTIVE  No new complaints Left UE weakness   VITAL SIGNS:  Blood pressure 114/81, pulse 81, temperature 97.8 F (36.6 C), temperature source Oral, resp. rate 16, height 5\' 4"  (1.626 m), weight 60.8 kg (134 lb 1.6 oz), last menstrual period 12/29/1981, SpO2 97 %.  I/O:   Intake/Output Summary (Last 24 hours) at 04/27/17 0900 Last data filed at 04/26/17 1945  Gross per 24 hour  Intake              120 ml  Output                0 ml  Net              120 ml    PHYSICAL EXAMINATION:  GENERAL:  71 y.o.-year-old patient lying in the bed with no acute distress.  EYES: Pupils equal, round, reactive to light and accommodation. No scleral icterus. Extraocular muscles intact.   HEENT: Head atraumatic, normocephalic. Oropharynx and nasopharynx clear.  NECK:  Supple, no jugular venous distention. No thyroid enlargement, no tenderness.  LUNGS: Normal breath sounds bilaterally, no wheezing, rales,rhonchi or crepitation. No use of accessory muscles of respiration.  CARDIOVASCULAR: S1, S2 normal. No murmurs, rubs, or gallops.  ABDOMEN: Soft, non-tender, non-distended. Bowel sounds present. No organomegaly or mass.  EXTREMITIES: No pedal edema, cyanosis, or clubbing.  NEUROLOGIC: Cranial nerves II through XII are intact.left UE and LE weakness Sensation intact. Gait not checked.  PSYCHIATRIC: The patient is alert and oriented x 3.  SKIN: No obvious rash, lesion, or ulcer.   DATA REVIEW:   CBC   Recent Labs Lab 04/25/17 0443  WBC 4.6  HGB 12.2  HCT 36.0  PLT 232  Chemistries   Recent Labs Lab 04/25/17 0443  NA 141  K 3.7  CL 111  CO2 25  GLUCOSE 95  BUN 11  CREATININE 0.58  CALCIUM 8.6*  AST 28  ALT 26  ALKPHOS 88  BILITOT 0.6    Microbiology Results   No results found for this or any previous visit (from the past 240 hour(s)).  RADIOLOGY:  No results found.   Management plans discussed with the patient, family and they are in agreement.  CODE STATUS:     Code Status Orders        Start     Ordered   04/24/17 1845  Full code  Continuous     04/24/17 1844    Code Status History    Date Active Date Inactive Code Status Order ID Comments User Context   This patient has a current code status but no historical code status.      TOTAL TIME TAKING CARE OF THIS PATIENT: *40* minutes.    Candela Krul M.D on 04/27/2017 at 9:00 AM  Between 7am to 6pm - Pager - [phone] After 6pm go to [email or web address] - password [email or web address] [facility]  Office  [phone]  CC: Primary care physician; Donna Pheasant, MD

## 2017-04-27 NOTE — Care Management Important Message (Signed)
Important Message  Patient Details  Name: Donna Rhodes MRN: [id number] Date of Birth: [birth year]   Medicare Important Message Given:  Yes    Shelbie Ammons, RN 04/27/2017, 9:56 AM

## 2017-04-27 NOTE — Care Management (Signed)
Received telephone from Jodell Cipro, RN representative for Inpatient Acute Rehab at [facility]. States that her insurance has approved her admission to their facility. Will call the room and discuss with family members. Shelbie Ammons RN MSN CCM Care Management [phone]

## 2017-04-27 NOTE — Plan of Care (Signed)
Problem: SLP Dysphagia Goals Goal: Misc Dysphagia Goal Pt will safely tolerate po diet of least restrictive consistency w/ no overt s/s of aspiration noted by Staff/pt/family x3 sessions.    

## 2017-04-27 NOTE — H&P (Signed)
Physical Medicine and Rehabilitation Admission H&P       Chief Complaint  Patient presents with  . Left sided weakness, left facial weakness and slurred speech.     HPI:  Donna Felty Schronceis an 71 y.o.LH femalewith history of HTN, GERD, RA, lung nodule who was admitted to [facility] on 04/24/17 with multiple falls and MRI brain without acute findings and cervical spondylosis with central protrusion at C4/5. She was sent home and started developing left sided weakness with left facial weakness and slurred speech.  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 MRA brain/neck was negative for occlusion or stenosis. 2D echo with EF 50-55% and grade 1 diastolic dysfunction. Stroke felt to be due to small vessel disease and Dr. Doy Mince recommended low dose ASA and plavix for secondary stroke prevention. She did have increase in weakness LUE on 4/30 and was treated with fluid bolus. She was started on dysphagia 3, thin liquids with aspiration precautions and educated on OME. Patient with resultant left sided weakness, right lean and decreased in ability to WB on LLE. She has had significant decline in mobility and ability to carry out ADL tasks. CIR recommended for follow up therapy.     Review of Systems  HENT: Negative for hearing loss and tinnitus.   Eyes: Negative for blurred vision and double vision.  Respiratory: Positive for wheezing. Negative for hemoptysis and shortness of breath.   Cardiovascular: Negative for chest pain, claudication and leg swelling.  Gastrointestinal: Positive for constipation. Negative for heartburn.  Genitourinary: Negative for dysuria and urgency.  Musculoskeletal: Positive for back pain and joint pain (wrists--worse left thumb. Neck pain due to DDD.  ). Negative for myalgias.  Skin: Negative for itching and rash.  Neurological: Positive for speech change and focal weakness.  Negative for dizziness, sensory change, weakness and headaches.  Psychiatric/Behavioral: Negative for memory loss and suicidal ideas. The patient is nervous/anxious.           Past Medical History:  Diagnosis Date  . Atrophic vaginitis   . Dysplasia of vagina    vin 1 - vulva and vaginal cuff  . GERD (gastroesophageal reflux disease)   . HCV infection   . HSV (herpes simplex virus) infection   . Hypercholesterolemia   . Hypertension   . Hypothyroidism   . Lung nodule    stable  . Menopausal state   . Osteoarthritis    hand involvement, cervical disc disease  . Osteoporosis   . Pelvic pain in female   . Thyroid nodule   . Vaginal Pap smear, abnormal    lgsil         Past Surgical History:  Procedure Laterality Date  . ADENOIDECTOMY    . APPENDECTOMY  1965  . [place]   left  . CHOLECYSTECTOMY    . [place]  . laser vein surgery    . MANDIBLE RECONSTRUCTION  1992  . TOENAIL EXCISION  1998   several  . [place]  . VAGINAL HYSTERECTOMY  1983   als0- anterior/posterior colporrhaphy  . VAGINAL WOUND CLOSURE / REPAIR  1999, 2000  . VAGINAL WOUND CLOSURE / REPAIR            Family History  Problem Relation Age of Onset  . Hypertension Father   . Osteoarthritis Mother   . Arthritis/Rheumatoid Sister   . Breast cancer  paternal side  . Breast cancer Paternal Aunt   . Breast cancer Cousin     maternal  . Uterine cancer Maternal Grandmother   . Ovarian cancer Neg Hx   . Heart disease Neg Hx   . Diabetes Neg Hx   . Colon cancer Neg Hx     Social History:   Divorced. Daughter lives with her. She reports that she quit smoking about 37 years ago--smoked for 19 years. 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 She has never used smokeless tobacco. She reports that she does not drink alcohol or use drugs.          Allergies  Allergen Reactions    . Amoxicillin Other (See Comments)    Irritated tongue  . Avelox [Moxifloxacin Hcl In Nacl]   . Azithromycin Other (See Comments)    Caused mouth and tongue to be sore  . Codeine   . Estroven Weight Management [Nutritional Supplements]     Mouth soreness  . Moxifloxacin Other (See Comments)  . Naproxen           Medications Prior to Admission  Medication Sig Dispense Refill  . amLODipine (NORVASC) 5 MG tablet Take 1 tablet (5 mg total) by mouth daily. 90 tablet 1  . esomeprazole (NEXIUM) 40 MG capsule Take 1 capsule (40 mg total) by mouth daily. 30 capsule 2  . estradiol (ESTRACE VAGINAL) 0.1 MG/GM vaginal cream Place 1 Applicatorful vaginally at bedtime.    . folic acid (FOLVITE) 1 MG tablet Take 1 mg by mouth daily.    Marland Kitchen levothyroxine (SYNTHROID, LEVOTHROID) 50 MCG tablet Take 1 tablet (50 mcg total) by mouth daily. 90 tablet 0  . methotrexate 50 MG/2ML injection Inject 0.14mL under the skin once a week.    Marland Kitchen acyclovir (ZOVIRAX) 200 MG capsule Take 1 capsule (200 mg total) by mouth 2 (two) times daily. (Patient not taking: Reported on 04/24/2017) 180 capsule 3  . calcium-vitamin D (OSCAL WITH D) 250-125 MG-UNIT tablet Take 1 tablet by mouth daily.    Marland Kitchen estradiol (ESTRACE) 1 MG tablet Take 0.5 tablets (0.5 mg total) by mouth daily. (Patient not taking: Reported on 04/24/2017) 90 tablet 3    Home: [place] expects to be discharged to:: Private residence Living Arrangements: Alone Available Help at Discharge: Family, Available PRN/intermittently Type of Home: House Home Access: Stairs to enter Technical brewer of Steps: 4 Entrance Stairs-Rails: Can reach both Home Layout: One level Bathroom Shower/Tub: Tub/shower unit, Architectural technologist: Standard Home Equipment: None   Functional History: Prior Function Level of Independence: Independent Comments: Patient previously independent and was helping take care of her 31 year old  mother, mow the grass, till dirt for flower planting, etc.  Functional Status:  Mobility: Bed Mobility Overal bed mobility: Needs Assistance Bed Mobility: Supine to Sit Supine to sit: Min assist, Min guard Sit to supine: Min assist General bed mobility comments: Assist for LLE over edge of bed and Min guard for trunk to sit Transfers Overall transfer level: Needs assistance Equipment used: None Transfers: Sit to/from Stand, [employer] Transfers Sit to Stand: Mod assist, +2 physical assistance Stand pivot transfers: Mod assist, +2 physical assistance Squat pivot transfers: Max assist General transfer comment: Performed bed to/from [facility]. STS performed again for short ambulation bed to chair Ambulation/Gait Ambulation/Gait assistance: Mod assist, +2 physical assistance Ambulation Distance (Feet): 2 Feet Assistive device: None Gait Pattern/deviations:  (Small side step R; pivots L) General Gait Details: Improvement following  wb'g instruction on L to allow R steps versus just stand pivot transfer  ADL: ADL Overall ADL's : Needs assistance/impaired Eating/Feeding: Set up, Minimal assistance Eating/Feeding Details (indicate cue type and reason): using R hand which is non dominant; rec dycem and rocker knife Grooming: Wash/dry hands, Wash/dry face, Oral care, Brushing hair, Set up, Minimal assistance Grooming Details (indicate cue type and reason): pt doing well with trying to use L hand to help stabilize for ADLs like feeding and grooming skills Upper Body Dressing : Moderate assistance, Set up Upper Body Dressing Details (indicate cue type and reason): cue for one handed tech and to dress L side first Lower Body Dressing: Maximal assistance, Set up Lower Body Dressing Details (indicate cue type and reason): tends to lean to R and decreased movement in L ankle and toes; max assist to stand to pull pants over hips  Cognition: Cognition Overall Cognitive Status: Within Functional  Limits for tasks assessed Orientation Level: Oriented X4 Cognition Arousal/Alertness: Awake/alert Behavior During Therapy: WFL for tasks assessed/performed, Flat affect Overall Cognitive Status: Within Functional Limits for tasks assessed General Comments: Pt crying during evaluation but did well with encouragement and redirection   Blood pressure 114/81, pulse 81, temperature 97.8 F (36.6 C), temperature source Oral, resp. rate 16, height 5\' 4"  (1.626 m), weight 60.8 kg (134 lb 1.6 oz), last menstrual period 12/29/1981, SpO2 97 %. Physical Exam  Nursing note and vitals reviewed. Constitutional: She is oriented to person, place, and time. She appears well-developed and well-nourished.  HENT:  Head: Normocephalic and atraumatic.  Mouth/Throat: Oropharynx is clear and moist.  Eyes: Conjunctivae are normal. Pupils are equal, round, and reactive to light.  Neck: Normal range of motion. Neck supple.  Cardiovascular: Normal rate, regular rhythm and intact distal pulses.   Respiratory: Effort normal. No stridor. No respiratory distress. She has no wheezes. She has rhonchi. She has rales in the right middle field and the right lower field.  GI: Soft. Bowel sounds are normal. She exhibits no distension. There is no tenderness.  Musculoskeletal: She exhibits tenderness (discomfort with left thumb and wrist movement). She exhibits no edema.  Neurological: She is alert and oriented to person, place, and time. A cranial nerve deficit is present.  Left facial weakness. Speech slow and fatigued appearing. Processing delayed.  Left MMT:  tr/5 deltoid, tr/5 bicep, tr/5 tricep, 0/5 wrist extension, 0/5 hand intrinsics.      2/5 hip flexor, 3/5 knee extension, 1/5 ankle dorsiflexion, 2/5 ankle plantarflexion. Right: 5/5 prox to distal. Sensation intact to LT and pain left arm and leg.   Skin: Skin is warm and dry.  Bruise on left lateral buttock and right elbow.   Psychiatric: Her affect is blunt. She is  withdrawn. Cognition and memory are normal.    Lab Results Last 48 Hours        Results for orders placed or performed during the hospital encounter of 04/24/17 (from the past 48 hour(s))  Lipid panel     Status: Abnormal   Collection Time: 04/26/17  4:30 AM  Result Value Ref Range   Cholesterol 203 (H) 0 - 200 mg/dL   Triglycerides 88 <150 mg/dL   HDL 54 >40 mg/dL   Total CHOL/HDL Ratio 3.8 RATIO   VLDL 18 0 - 40 mg/dL   LDL Cholesterol 131 (H) 0 - 99 mg/dL    Comment:        Total Cholesterol/HDL:CHD Risk Coronary Heart Disease Risk Table  Men   Women  1/2 Average Risk   3.4   3.3  Average Risk       5.0   4.4  2 X Average Risk   9.6   7.1  3 X Average Risk  23.4   11.0        Use the calculated Patient Ratio above and the CHD Risk Table to determine the patient's CHD Risk.        ATP III CLASSIFICATION (LDL):  <100     mg/dL   Optimal  100-129  mg/dL   Near or Above                    Optimal  130-159  mg/dL   Borderline  160-189  mg/dL   High  >190     mg/dL   Very High   Glucose, capillary     Status: Abnormal   Collection Time: 04/26/17  8:23 AM  Result Value Ref Range   Glucose-Capillary 156 (H) 65 - 99 mg/dL  Glucose, capillary     Status: Abnormal   Collection Time: 04/27/17  7:28 AM  Result Value Ref Range   Glucose-Capillary 100 (H) 65 - 99 mg/dL     Imaging Results (Last 48 hours)  No results found.       Medical Problem List and Plan: 1.  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  DVT Prophylaxis/Anticoagulation: Pharmaceutical: Lovenox 3. Pain Management: tylenol prn 4. Mood: Seems flat--question fatigue. Team to provide ego support. LCSW to follow for evaluation and support.              -neuropsych for mood assessment 5. Neuropsych: This patient is capable of making decisions on her own behalf. 6. Skin/Wound Care: Monitor I/O 7.  Fluids/Electrolytes/Nutrition: Monitor I/O. Check lytes in am.  8. HTN: Montior BP bid. On Norvasc  daily 9. Dyslipidemia:  Continue Lipitor.  10. Hypothyroid: On supplement 11. RA: Managed with methotrexate injections weekly.  12. Anorexia: Will liberalize diet to regular to help with food choices.  13. Persistent productive cough, ?recent aspiration, right lower-mid rales on exam             -IS             -check CXR    Post Admission Physician Evaluation: 1. Functional deficits secondary  to right basal ganglia and PLIC infarct. 2. Patient is admitted to receive collaborative, interdisciplinary care between the physiatrist, rehab nursing staff, and therapy team. 3. Patient's level of medical complexity and substantial therapy needs in context of that medical necessity cannot be provided at a lesser intensity of care such as a SNF. 4. Patient has experienced substantial functional loss from his/her baseline which was documented above under the "Functional History" and "Functional Status" headings.  Judging by the patient's diagnosis, physical exam, and functional history, the patient has potential for functional progress which will result in measurable gains while on inpatient rehab.  These gains will be of substantial and practical use upon discharge  in facilitating mobility and self-care at the household level. 5. Physiatrist will provide 24 hour management of medical needs as well as oversight of the therapy plan/treatment and provide guidance as appropriate regarding the interaction of the two. 6. The Preadmission Screening has been reviewed and patient status is unchanged unless otherwise stated above. 7. 24 hour rehab nursing will  assist with bladder management, bowel management, safety, skin/wound care, disease management, medication administration, pain management and patient education  and help integrate therapy concepts, techniques,education, etc. 8. PT will assess and treat  for/with: Lower extremity strength, range of motion, stamina, balance, functional mobility, safety, adaptive techniques and equipment, NMR, family education, community reintegration.   Goals are: mod I to supervision. 9. OT will assess and treat for/with: ADL's, functional mobility, safety, upper extremity strength, adaptive techniques and equipment, NMR, family ed.   Goals are: mod I to min assist. Therapy may proceed with showering this patient. 10. SLP will assess and treat for/with: cognition, communication.  Goals are: mod I. 11. Case Management and Social Worker will assess and treat for psychological issues and discharge planning. 12. Team conference will be held weekly to assess progress toward goals and to determine barriers to discharge. 13. Patient will receive at least 3 hours of therapy per day at least 5 days per week. 14. ELOS: 16-21 days       15. Prognosis:  excellent     Meredith Staggers, MD, [place] [facility] 04/27/2017  Bary Leriche, Hershal Coria 04/27/2017

## 2017-04-27 NOTE — PMR Pre-admission (Signed)
[facility] PMR Admission Coordinator Pre-Admission Assessment  Patient: Donna Rhodes is an 71 y.o., female MRN: [id number] DOB: Aug 30, 1946 Height: '5\' 4"'  (162.6 cm) Weight: 60.8 kg (134 lb 1.6 oz)  Insurance Information HMO:      PPO:  Yes     PCP:       IPA:       80/20:       OTHER:  Group # [id number] PRIMARY:  [employer]      Policy#: [id number]      Subscriber:  [name] CM Name: Janey Genta     Phone#:                          Fax#:  Has EPIC access Pre-Cert#: [id number] for 7 days with review due on 05/02/17      Employer:  Retired Benefits:  Phone #: [phone]     Name:  Emmit Pomfret. Date: 12/29/15     Deduct:  $0      Out of Pocket Max:  $3400 (met $0)      Life Max:  unlimited CIR: $250 days 1-6      SNF: $0 days 1-20; $150 days 21-100 Outpatient:  Med necessity     Co-Pay: $15/visit Home Health: Med necessity      Co-Pay: $25/visit DME: 80%     Co-Pay: 20% Providers: in network  Medicaid Application Date:        Case Manager:   Disability Application Date:        Case Worker:    Emergency Facilities manager Information    Name Relation Home Work Mobile   [place] Daughter [phone]     Barnet Pall Daughter   [phone]      Current Medical History  Patient Admitting Diagnosis:  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 female with a past medical history significant for HTN, [name], and thyroid disease now with acute onset left-sided weakness. She in ER earlier today, 04/24/17, with weakness which resolved. CT and MRI negative at that time. Went home and developed worsening left-sided symptoms. Patient returned to ED and now has flaccid LUE and left facial droop and was admitted 04/24/17.  Repeat MRI with acute infarction in the right basal ganglia/ posterior limb internal capsule. No evidence of hemorrhage or mass effect. PT/OT/SLP evaluations were completed with recommendations for acute  inpatient rehab admission.  Patient passed swallow evaluation and started on heart healthy, thin liquids diet.  Patient[facility]  [facility] has been reviewed by the rehabilitation admission coordinator and physician.  NIH Stroke scale: 6  Past Medical History  Past Medical History:  Diagnosis Date  . Atrophic vaginitis   . Dysplasia of vagina    vin 1 - vulva and vaginal cuff  . GERD (gastroesophageal reflux disease)   . HCV infection   . HSV (herpes simplex virus) infection   . Hypercholesterolemia   . Hypertension   . Hypothyroidism   . Lung nodule    stable  . Menopausal state   . Osteoarthritis    hand involvement, cervical disc disease  . Osteoporosis   . Pelvic pain in female   . Thyroid nodule   . Vaginal Pap smear, abnormal    lgsil    Family History   family history includes Arthritis/Rheumatoid in her sister; Breast cancer in her cousin and paternal aunt; Hypertension in her  father; Osteoarthritis in her mother; Uterine cancer in her maternal grandmother.  Prior Rehab/Hospitalizations Has the patient had major surgery during 100 days prior to admission? No   Current Medications  See MAR [facility]  Patients Current Diet:   Heart healthy, thin liquids  Precautions / Restrictions Precautions Precautions: Fall Restrictions Weight Bearing Restrictions: No   Has the patient had 2 or more falls or a fall with injury in the past year?Yes  Patient has suffered one fall at the time of stroke with left head laceration and left hip bruising.  Prior Activity Level Community (5-7x/wk): Went out daily, was driving, very active.  Prior Functional Level Self Care: Did the patient need help bathing, dressing, using the toilet or eating?  Independent  Indoor Mobility: Did the patient need assistance with walking from room to room (with or without device)? Independent  Stairs: Did the patient need  assistance with internal or external stairs (with or without device)? Independent  Functional Cognition: Did the patient need help planning regular tasks such as shopping or remembering to take medications? Independent  Home Assistive Devices / Equipment Home Assistive Devices/Equipment: None Home Equipment: None  Prior Device Use: Indicate devices/aids used by the patient prior to current illness, exacerbation or injury? None   Prior Functional Level Current Functional Level  Bed Mobility  Independent  Min assist   Transfers  Independent  Mod assist   Mobility - Walk/Wheelchair  Independent  Mod assist (Ambulated 2 feet no device)   Upper Body Dressing  Independent  Mod assist   Lower Body Dressing  Independent  Max assist   Grooming  Independent  Min assist   Eating/Drinking  Independent  Min assist   Toilet Transfer  Independent  Mod assist   Bladder Continence   WDL  Using [facility]   Bowel Management  WDL  No BM since 04/23/17   Stair Climbing  Independent Other (Not tried)   Counsellor and intact  Mild impairment   Memory  WDL  alert and oriented   Cooking/Meal Prep  independent      Housework  independent    Money Management  independent    Driving Yes, Independent     Special needs/care consideration BiPAP/CPAP No CPM No Continuous Drip IV No Dialysis No        Life Vest No Oxygen no Special Bed No Trach Size No Wound Vac (area) No      Skin: Dressing left head and left hip bruising                           Bowel mgmt: Last BM 04/23/17 Bladder mgmt: Voiding up on [facility] with assistance Diabetic mgmt No  Previous Home Environment Living Arrangements: Alone Available Help at Discharge: Family, Available PRN/intermittently Type of Home: House Home Layout: One level Home Access: Stairs to enter Entrance Stairs-Rails: Can reach both Entrance Stairs-Number of Steps: 4 Bathroom Shower/Tub: Tub/shower unit,  Architectural technologist: Standard Home Care Services: No  Discharge Living Setting Plans for Discharge Living Setting: House, Lives with (comment) (Lives with youngest daughter.) Type of Home at Discharge: House Discharge Home Layout: One level Discharge Home Access: Stairs to enter Entrance Stairs-Number of Steps: 3 steps to porch and then 1 step into house Does the patient have any problems obtaining your medications?: No  Social/Family/Support Systems Patient Roles: Parent (Has 3 daughters.) Contact Information: Keturah Barre - daughter - [phone] Anticipated  Caregiver: Daughters X 2 Anticipated Caregiver's Contact Information: Barnet Pall - daughter - [phone] Ability/Limitations of Caregiver: Youngest dtr not reliable.  Two older daughters plan to share caregiver duties after discharge. Caregiver Availability: 24/7 Discharge Plan Discussed with Primary Caregiver: Yes (Spoke with Amy Clontz) Is Caregiver In Agreement with Plan?: Yes Does Caregiver/Family have Issues with Lodging/Transportation while Pt is in Rehab?: No  Goals/Additional Needs Patient/Family Goal for Rehab: PT/OT supervision to min assist, SLP mod I and supervision goals Expected length of stay: 10-14 days Cultural Considerations: None Dietary Needs: Heart healthy, thin liquids diet Equipment Needs: TBD Pt/Family Agrees to Admission and willing to participate: Yes (Spoke with daughter, Amy.) Program Orientation Provided & Reviewed with Pt/Caregiver Including Roles  & Responsibilities: Yes  Patient Condition: Patient has suffered a right CVA.  I spoke with patient and her daughter and I have reviewed all progress and medical records.  Patient can tolerate and will benefit from 3 hours of therapy a day in the coordinated setting of inpatient rehab.  Patient was very active and independent prior to admission.  Her daughters are supportive and plan to provide care after rehab discharge.  I have discussed and  reviewed all findings with rehab MD and have approval for acute inpatient rehab admission for today.  Preadmission Screen Completed By:  Retta Diones, 04/27/2017 12:03 PM ______________________________________________________________________   Discussed status with Dr. Naaman Plummer on 04/27/17 at 1153 and received telephone approval for admission today.  Admission Coordinator:  Retta Diones, time 1203/Date 04/27/17   Assessment/Plan: Diagnosis: RIGHT BASAL GANGLIA/PLIC INFARCT 1. Does the need for close, 24 hr/day  Medical supervision in concert with the patient's rehab needs make it unreasonable for this patient to be served in a less intensive setting? Yes 2. Co-Morbidities requiring supervision/potential complications: htn, hsv, gerd 3. Due to bladder management, bowel management, safety, skin/wound care, disease management, medication administration, pain management and patient education, does the patient require 24 hr/day rehab nursing? Yes 4. Does the patient require coordinated care of a physician, rehab nurse, PT (1-2 hrs/day, 5 days/week), OT (1-2 hrs/day, 5 days/week) and SLP (1-2 hrs/day, 5 days/week) to address physical and functional deficits in the context of the above medical diagnosis(es)? Yes Addressing deficits in the following areas: balance, endurance, locomotion, strength, transferring, bowel/bladder control, bathing, dressing, feeding, grooming, toileting, cognition, speech and psychosocial support 5. Can the patient actively participate in an intensive therapy program of at least 3 hrs of therapy 5 days a week? Yes 6. The potential for patient to make measurable gains while on inpatient rehab is excellent 7. Anticipated functional outcomes upon discharge from inpatients are: supervision and min assist PT, supervision and min assist OT, modified independent and supervision SLP 8. Estimated rehab length of stay to reach the above functional goals is: 10-14 days 9. Does the patient  have adequate social supports to accommodate these discharge functional goals? Yes 10. Anticipated D/C setting: Home 11. Anticipated post D/C treatments: HH therapy and Outpatient therapy 12. Overall Rehab/Functional Prognosis: excellent    RECOMMENDATIONS: This patient's condition is appropriate for continued rehabilitative care in the following setting: CIR Patient has agreed to participate in recommended program. Yes Note that insurance prior authorization may be required for reimbursement for recommended care.  Comment: admit to inpatient rehab today  Meredith Staggers, MD, [facility] 04/27/2017   Retta Diones 04/27/2017

## 2017-04-27 NOTE — Progress Notes (Signed)
Pt arrived via carelink at 240pm. Daughter at bedside. RN educated pt and daughter on safety plan and rehab process. Call bell with in reach, bed alarm in place.

## 2017-04-27 NOTE — Progress Notes (Signed)
Orthopedic Tech Progress Note Patient Details:  Donna Rhodes [birth year] [id number] Called [facility] for braces. Patient ID: Donna Rhodes, female   DOB: Mar 25, 1946, 71 y.o.   MRN: [id number]   Braulio Bosch 04/27/2017, 4:23 PM

## 2017-04-28 ENCOUNTER — Inpatient Hospital Stay (HOSPITAL_COMMUNITY): Payer: PPO | Admitting: Physical Therapy

## 2017-04-28 ENCOUNTER — Inpatient Hospital Stay (HOSPITAL_COMMUNITY): Payer: PPO | Admitting: Speech Pathology

## 2017-04-28 ENCOUNTER — Inpatient Hospital Stay (HOSPITAL_COMMUNITY): Payer: PPO

## 2017-04-28 ENCOUNTER — Inpatient Hospital Stay (HOSPITAL_COMMUNITY): Payer: PPO | Admitting: Occupational Therapy

## 2017-04-28 DIAGNOSIS — M05742 Rheumatoid arthritis with rheumatoid factor of left hand without organ or systems involvement: Secondary | ICD-10-CM

## 2017-04-28 DIAGNOSIS — I639 Cerebral infarction, unspecified: Secondary | ICD-10-CM

## 2017-04-28 DIAGNOSIS — I10 Essential (primary) hypertension: Secondary | ICD-10-CM

## 2017-04-28 DIAGNOSIS — K219 Gastro-esophageal reflux disease without esophagitis: Secondary | ICD-10-CM

## 2017-04-28 DIAGNOSIS — M05741 Rheumatoid arthritis with rheumatoid factor of right hand without organ or systems involvement: Secondary | ICD-10-CM

## 2017-04-28 DIAGNOSIS — M0600 Rheumatoid arthritis without rheumatoid factor, unspecified site: Secondary | ICD-10-CM

## 2017-04-28 DIAGNOSIS — E7800 Pure hypercholesterolemia, unspecified: Secondary | ICD-10-CM

## 2017-04-28 LAB — COMPREHENSIVE METABOLIC PANEL
ALBUMIN: 3.3 g/dL — AB (ref 3.5–5.0)
ALT: 28 U/L (ref 14–54)
AST: 33 U/L (ref 15–41)
Alkaline Phosphatase: 96 U/L (ref 38–126)
Anion gap: 9 (ref 5–15)
BUN: 14 mg/dL (ref 6–20)
CHLORIDE: 106 mmol/L (ref 101–111)
CO2: 24 mmol/L (ref 22–32)
CREATININE: 0.76 mg/dL (ref 0.44–1.00)
Calcium: 8.9 mg/dL (ref 8.9–10.3)
GFR calc Af Amer: >60 mL/min (ref >60)
GLUCOSE: 97 mg/dL (ref 65–99)
POTASSIUM: 3.6 mmol/L (ref 3.5–5.1)
SODIUM: 139 mmol/L (ref 135–145)
Total Bilirubin: 0.7 mg/dL (ref 0.3–1.2)
Total Protein: 6.6 g/dL (ref 6.5–8.1)

## 2017-04-28 LAB — CBC WITH DIFFERENTIAL/PLATELET
BASOS ABS: 0 10*3/uL (ref 0.0–0.1)
BASOS PCT: 1 %
EOS PCT: 5 %
Eosinophils Absolute: 0.3 10*3/uL (ref 0.0–0.7)
HCT: 38.5 % (ref 36.0–46.0)
Hemoglobin: 12.5 g/dL (ref 12.0–15.0)
LYMPHS PCT: 21 %
Lymphs Abs: 1.4 10*3/uL (ref 0.7–4.0)
MCH: 30.5 pg (ref 26.0–34.0)
MCHC: 32.5 g/dL (ref 30.0–36.0)
MCV: 93.9 fL (ref 78.0–100.0)
MONO ABS: 0.5 10*3/uL (ref 0.1–1.0)
Monocytes Relative: 7 %
Neutro Abs: 4.4 10*3/uL (ref 1.7–7.7)
Neutrophils Relative %: 66 %
PLATELETS: 242 10*3/uL (ref 150–400)
RBC: 4.1 MIL/uL (ref 3.87–5.11)
RDW: 14.8 % (ref 11.5–15.5)
WBC: 6.6 10*3/uL (ref 4.0–10.5)

## 2017-04-28 MED ORDER — FAMOTIDINE 10 MG PO TABS
10.0000 mg | ORAL_TABLET | Freq: Two times a day (BID) | ORAL | Status: DC
  Administered 2017-04-28 – 2017-05-19 (×43): 10 mg via ORAL
  Filled 2017-04-28 (×44): qty 1

## 2017-04-28 MED ORDER — PRO-STAT SUGAR FREE PO LIQD
30.0000 mL | Freq: Two times a day (BID) | ORAL | Status: DC
  Administered 2017-04-28: 30 mL via ORAL
  Filled 2017-04-28: qty 30

## 2017-04-28 MED ORDER — METHOTREXATE SODIUM CHEMO INJECTION 50 MG/2ML
15.0000 mg | Freq: Once | INTRAMUSCULAR | Status: AC
  Administered 2017-04-28 (×2): 15 mg via SUBCUTANEOUS
  Filled 2017-04-28: qty 0.6

## 2017-04-28 MED ORDER — IPRATROPIUM-ALBUTEROL 0.5-2.5 (3) MG/3ML IN SOLN
3.0000 mL | Freq: Four times a day (QID) | RESPIRATORY_TRACT | Status: DC
  Filled 2017-04-28: qty 3

## 2017-04-28 MED ORDER — IPRATROPIUM-ALBUTEROL 0.5-2.5 (3) MG/3ML IN SOLN: 3.0000 mL | RESPIRATORY_TRACT | Status: DC | PRN

## 2017-04-28 MED ORDER — METHOTREXATE SODIUM CHEMO INJECTION 50 MG/2ML: 30.0000 mg | Freq: Once | INTRAMUSCULAR | Status: DC

## 2017-04-28 MED ORDER — METHOTREXATE INJECTION FOR WOMEN'S HOSPITAL: 50.0000 mg | Freq: Once | INTRAMUSCULAR | Status: DC

## 2017-04-28 NOTE — Plan of Care (Signed)
Problem: RH BOWEL ELIMINATION Goal: RH STG MANAGE BOWEL WITH ASSISTANCE STG Manage Bowel with Assistance.  Outcome: Progressing No bowel movement this shift  Problem: RH SKIN INTEGRITY Goal: RH STG SKIN FREE OF INFECTION/BREAKDOWN Outcome: Progressing No signs of skin infection noted  Problem: RH SAFETY Goal: RH STG ADHERE TO SAFETY PRECAUTIONS W/ASSISTANCE/DEVICE STG Adhere to Safety Precautions With Assistance/Device.  Outcome: Progressing Safety precautions maintained  Problem: RH PAIN MANAGEMENT Goal: RH STG PAIN MANAGED AT OR BELOW PT'S PAIN GOAL Outcome: Progressing No pain issues this shift

## 2017-04-28 NOTE — Care Management Note (Signed)
Inpatient [facility] Individual Statement of Services  Patient Name:  ORLI DEGRAVE  Date:  04/28/2017  Welcome to the [facility].  Our goal is to provide you with an individualized program based on your diagnosis and situation, designed to meet your specific needs.  With this comprehensive rehabilitation program, you will be expected to participate in at least 3 hours of rehabilitation therapies Monday-Friday, with modified therapy programming on the weekends.  Your rehabilitation program will include the following services:  Physical Therapy (PT), Occupational Therapy (OT), Speech Therapy (ST), 24 hour per day rehabilitation nursing, Therapeutic Recreaction (TR), Neuropsychology, Case Management (Social Worker), Rehabilitation Medicine, Nutrition Services and Pharmacy Services  Weekly team conferences will be held on Wednesday to discuss your progress.  Your Social Worker will talk with you frequently to get your input and to update you on team discussions.  Team conferences with you and your family in attendance may also be held.  Expected length of stay: 2.5-3 weeks  Overall anticipated outcome: supervision with cueing  Depending on your progress and recovery, your program may change. Your Social Worker will coordinate services and will keep you informed of any changes. Your Social Worker's name and contact numbers are listed  below.  The following services may also be recommended but are not provided by the [facility] will be made to provide these services after discharge if needed.  Arrangements include referral to agencies that provide these services.  Your insurance has been verified to be:  [employer] Your primary doctor is:  Production manager  Pertinent information will be shared with your doctor and your  insurance company.  Social Worker:  Ovidio Kin, [place] or ([phone]  Information discussed with and copy given to patient by: Elease Hashimoto, 04/28/2017, 1:04 PM

## 2017-04-28 NOTE — Plan of Care (Signed)
Problem: RH SKIN INTEGRITY Goal: RH STG SKIN FREE OF INFECTION/BREAKDOWN Outcome: Progressing With min. Assist.  Problem: RH PAIN MANAGEMENT Goal: RH STG PAIN MANAGED AT OR BELOW PT'S PAIN GOAL Outcome: Progressing Less than 3,on 1 to 10

## 2017-04-28 NOTE — Progress Notes (Signed)
*  PRELIMINARY RESULTS* Vascular Ultrasound Bilateral lower extremity venous duplex has been completed.  Preliminary findings: No evidence of deep vein thrombosis in the visualized veins on the lower extremities.  Negative for baker's cysts bilaterally.   Everrett Coombe 04/28/2017, 4:38 PM

## 2017-04-28 NOTE — Evaluation (Signed)
Occupational Therapy Assessment and Plan  Patient Details  Name: Donna Rhodes MRN: [id number] Date of Birth: [birth year]  OT Diagnosis: hemiplegia affecting dominant side and muscle weakness (generalized) Rehab Potential: Rehab Potential (ACUTE ONLY): Excellent ELOS: 3 weeks   Today's Date: 04/28/2017 OT Individual Time: 0900-1000 OT Individual Time Calculation (min): 60 min     Problem List:  Patient Active Problem List   Diagnosis Date Noted  . Rheumatoid arthritis involving both hands ([place]) 04/28/2017  . 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[place]) 04/27/2017  . CVA (cerebral vascular accident) ([place]) 04/24/2017  . LGSIL Pap smear of vagina 12/15/2016  . Anal skin tag 02/02/2016  . Inflammatory arthritis 01/19/2016  . Right knee pain 01/19/2016  . Genital herpes 10/20/2015  . GERD (gastroesophageal reflux disease) 10/20/2015  . Status post vaginal hysterectomy 10/20/2015  . Health care maintenance 05/30/2015  . LLQ pain 03/31/2014  . Menopausal symptoms 03/31/2014  . Hypercholesterolemia 12/31/2012  . Hypothyroidism 12/29/2012  . Osteoarthritis 12/29/2012  . PULMONARY NODULE 01/02/2008  . Depression 12/30/2007  . Essential hypertension 12/30/2007    Past Medical History:  Past Medical History:  Diagnosis Date  . Atrophic vaginitis   . Dysplasia of vagina    vin 1 - vulva and vaginal cuff  . GERD (gastroesophageal reflux disease)   . HCV infection   . HSV (herpes simplex virus) infection   . Hypercholesterolemia   . Hypertension   . Hypothyroidism   . Lung nodule    stable  . Menopausal state   . Osteoarthritis    hand involvement, cervical disc disease  . Osteoporosis   . Pelvic pain in female   . Rheumatoid arthritis ([place])   . Thyroid nodule   . Vaginal Pap smear, abnormal    lgsil   Past Surgical History:  Past Surgical History:  Procedure Laterality Date  . ADENOIDECTOMY    . APPENDECTOMY  1965  . [place]   left  . CHOLECYSTECTOMY    .  [place]  . laser vein surgery    . MANDIBLE RECONSTRUCTION  1992  . TOENAIL EXCISION  1998   several  . [place]  . VAGINAL HYSTERECTOMY  1983   als0- anterior/posterior colporrhaphy  . VAGINAL WOUND CLOSURE / REPAIR  1999, 2000  . VAGINAL WOUND CLOSURE / REPAIR      Assessment & Plan Clinical Impression: Patient is a 71 y.o. year old female with recent admission to the hospital on  28 APR with acute basal ganglia/posterior limb internal capsule infarction with flacid L UE. Patient was at ED with negative CT/MRI earlier in day and returned 4 hours later with change in status. PMH includes HTN, hypercholestolemia, hypothyroidism, depression, and GERD. Patient transferred to CIR on 04/27/2017 .    Patient currently requires Mod/ max with basic self-care skills secondary to muscle weakness, abnormal tone, and decreased standing balance, hemiplegia and decreased balance strategies.  Prior to hospitalization, patient could complete BADL and iADL with independent .  Patient will benefit from skilled intervention to increase independence with basic self-care skills prior to discharge home with care partner.  Anticipate patient will require intermittent supervision and follow up home health.  OT - End of Session Endurance Deficit: Yes Endurance Deficit Description: Required rest breaks within ADL tasks OT Assessment Rehab Potential (ACUTE ONLY): Excellent OT Patient demonstrates impairments in the following area(s): Balance;Endurance;Motor;Pain;Safety OT Basic ADL's Functional Problem(s): Eating;Grooming;Bathing;Dressing;Toileting OT Transfers Functional Problem(s): Tub/Shower;Toilet OT Additional Impairment(s): Fuctional Use of  Upper Extremity OT Plan OT Intensity: Minimum of 1-2 x/day, 45 to 90 minutes OT Frequency: 5 out of 7 days OT Duration/Estimated Length of Stay: 3 weeks OT Treatment/Interventions: Balance/vestibular training;Community reintegration;Discharge  planning;DME/adaptive equipment instruction;Functional electrical stimulation;Neuromuscular re-education;Functional mobility training;Pain management;Patient/family education;Self Care/advanced ADL retraining;Therapeutic Activities;Therapeutic Exercise;UE/LE Coordination activities;UE/LE Strength taining/ROM OT Self Feeding Anticipated Outcome(s): Mod I OT Basic Self-Care Anticipated Outcome(s): Mod I/Supervision OT Toileting Anticipated Outcome(s): Supervision OT Bathroom Transfers Anticipated Outcome(s): Supervision OT Recommendation Recommendations for Other Services: Neuropsych consult;Therapeutic Recreation consult Therapeutic Recreation Interventions: [place] group Patient destination: Home Follow Up Recommendations: Home health OT Equipment Recommended: To be determined;Tub/shower bench Equipment Details: TBD pending pt progress   Skilled Therapeutic Intervention  OT eval completed addressing rehab process, OT purpose, POC, and goals. Treatment provided focused on modified bathing/dressing, functional transfers, L NMR, and self-ROM. Squat-pivot bed>BSC over toilet>tub transfer>wc with overall Mod/max A. Integrated L UE w/ hand-over hand assist into bathing tasks w/ push/pull and weight bearing. Sit<>stand with Max A for sit<>stand for LB ADLs. Educated pt on self-ROM for L UE including shoulder/elbow/wrist/hand flex/ext. Pt left seated in wc at end of session with daughter present and needs met.   OT Evaluation Precautions/Restrictions  Precautions Precautions: Fall Precaution Comments: L hemiplegia Restrictions Weight Bearing Restrictions: No Pain Pain Assessment Pain Assessment: No/denies pain Pain Score: 0-No pain Home Living/Prior Functioning Home Living Living Arrangements: Alone Available Help at Discharge: Family, Available PRN/intermittently Type of Home: House Home Access: Stairs to enter Technical brewer of Steps: 3 Entrance Stairs-Rails: Right, Left Home  Layout: One level Bathroom Shower/Tub: Tub/shower unit, Architectural technologist: Standard  Lives With: Daughter, Family IADL History IADL Comments: Environmental education officer, active with family Prior Function Level of Independence: Independent with basic ADLs, Independent with homemaking with ambulation, Independent with homemaking with wheelchair  Able to Take Stairs?: Yes Driving: Yes Vocation: Retired Biomedical scientist: helps out 71 year old mother with shopping and yardwork Leisure: Hobbies-yes (Comment) Comments: Gardening, yardwork ADL ADL ADL Comments: Please see functional navigator Vision/Perception  Vision- History Baseline Vision/History: Wears glasses Wears Glasses: At all times Vision- Assessment Eye Alignment: Within Functional Limits Perception Perception: Within Functional Limits  Cognition Overall Cognitive Status: Within Functional Limits for tasks assessed Arousal/Alertness: Awake/alert Orientation Level: Person;Place;Situation Person: Oriented Place: Oriented Situation: Oriented Year: 2018 Month: April Day of Week: Correct Memory: Appears intact Immediate Memory Recall: Sock;Blue;Bed Memory Recall: Sock;Blue;Bed Memory Recall Sock: Without Cue Memory Recall Blue: Without Cue Memory Recall Bed: Without Cue Awareness: Appears intact Problem Solving: Appears intact Behaviors: Lability Safety/Judgment: Appears intact Sensation Sensation Light Touch: Appears Intact Hot/Cold: Appears Intact Coordination Gross Motor Movements are Fluid and Coordinated: No Fine Motor Movements are Fluid and Coordinated: No Coordination and Movement Description: LLE and LUE in flexor synergy pattern.  Motor  Motor Motor: Hemiplegia;Abnormal tone Motor - Skilled Clinical Observations: flexor tone more in LLE, slight flexor tone in LUE, hemiplegia Mobility  Transfers Sit to Stand: 2: Max assist Sit to Stand Details: Verbal cues for technique;Verbal cues for  precautions/safety;Verbal cues for sequencing  Balance Balance Balance Assessed: Yes Dynamic Sitting Balance Dynamic Sitting - Balance Support: During functional activity Dynamic Sitting - Level of Assistance: 4: Min assist Dynamic Standing Balance Dynamic Standing - Balance Support: During functional activity Dynamic Standing - Level of Assistance: 2: Max assist Dynamic Standing - Balance Activities: Reaching for objects Extremity/Trunk Assessment RUE Assessment RUE Assessment: Within Functional Limits (arthritic hands, functional) LUE Assessment LUE Assessment: Exceptions to WFL LUE PROM (degrees) LUE Overall PROM Comments:  flaccid, Painful PROM past 90 FF, slight flexor tone LUE Tone LUE Tone: Flaccid LUE Tone Comments: Some extensor tone noted    See Function Navigator for Current Functional Status.   Refer to Care Plan for Long Term Goals  Recommendations for other services: Neuropsych and Therapeutic Recreation  Kitchen group   Discharge Criteria: Patient will be discharged from OT if patient refuses treatment 3 consecutive times without medical reason, if treatment goals not met, if there is a change in medical status, if patient makes no progress towards goals or if patient is discharged from hospital.  The above assessment, treatment plan, treatment alternatives and goals were discussed and mutually agreed upon: by patient and by family  Valma Cava 04/28/2017, 9:08 PM

## 2017-04-28 NOTE — Progress Notes (Signed)
Physical Therapy Session Note  Patient Details  Name: Donna Rhodes MRN: [id number] Date of Birth: [birth year]  Today's Date: 04/28/2017 PT Individual Time: 1500-1530 PT Individual Time Calculation (min): 30 min    Skilled Therapeutic Interventions/Progress Updates:  Pt received seated in bed, denies pain and agreeable to treatment. Supine>sit with min guard with HOB elevated and bedrails. Squat pivot min/modA to L side with cues for technique. W/c mobility with minA initially for R hemi technique and pt running into wall on L side. Demonstrates improved navigation and avoidance of obstacles on L side with repetition, however do suspect mild L inattention. Gait with RW and L hand splint; mod/maxA for LLE progression and placement to reduce scissoring and assist with foot clearance d/t drop foot. Difficulty with managing RW, and hesitancy with progressing RLE while weight bearing through LLE. Second gait trial with hall rail RUE and LUE over therapist's shoulder; modA for LLE stance control to prevent buckling/recurvatum, and facilitate at glutes for hip extension in stance. Returned to room totalA in w/c; remained seated in w/c with daughter present and all needs in reach.      Therapy Documentation Precautions:  Precautions Precautions: Fall Precaution Comments: L hemiplegia Restrictions Weight Bearing Restrictions: No   See Function Navigator for Current Functional Status.   Therapy/Group: Individual Therapy  Luberta Mutter 04/28/2017, 3:55 PM

## 2017-04-28 NOTE — Evaluation (Signed)
Physical Therapy Assessment and Plan  Patient Details  Name: Donna Rhodes MRN: [id number] Date of Birth: 05/13/1946  PT Diagnosis: Abnormality of gait, Hemiplegia dominant and Muscle weakness Rehab Potential: Good ELOS: 17-21 days    Today's Date: 04/28/2017 PT Individual Time: 1100-1200 PT Individual Time Calculation (min): 60 min    Problem List:  Patient Active Problem List   Diagnosis Date Noted  . 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[place]) 04/27/2017  . CVA (cerebral vascular accident) ([place]) 04/24/2017  . LGSIL Pap smear of vagina 12/15/2016  . Anal skin tag 02/02/2016  . Inflammatory arthritis 01/19/2016  . Right knee pain 01/19/2016  . Genital herpes 10/20/2015  . GERD (gastroesophageal reflux disease) 10/20/2015  . Status post vaginal hysterectomy 10/20/2015  . Health care maintenance 05/30/2015  . LLQ pain 03/31/2014  . Menopausal symptoms 03/31/2014  . Hypercholesterolemia 12/31/2012  . Hypothyroidism 12/29/2012  . Osteoarthritis 12/29/2012  . PULMONARY NODULE 01/02/2008  . Depression 12/30/2007  . Essential hypertension 12/30/2007    Past Medical History:  Past Medical History:  Diagnosis Date  . Atrophic vaginitis   . Dysplasia of vagina    vin 1 - vulva and vaginal cuff  . GERD (gastroesophageal reflux disease)   . HCV infection   . HSV (herpes simplex virus) infection   . Hypercholesterolemia   . Hypertension   . Hypothyroidism   . Lung nodule    stable  . Menopausal state   . Osteoarthritis    hand involvement, cervical disc disease  . Osteoporosis   . Pelvic pain in female   . Rheumatoid arthritis ([place])   . Thyroid nodule   . Vaginal Pap smear, abnormal    lgsil   Past Surgical History:  Past Surgical History:  Procedure Laterality Date  . ADENOIDECTOMY    . APPENDECTOMY  1965  . [place]   left  . CHOLECYSTECTOMY    . [place]  . laser vein surgery    . MANDIBLE RECONSTRUCTION  1992  . TOENAIL EXCISION  1998    several  . [place]  . VAGINAL HYSTERECTOMY  1983   als0- anterior/posterior colporrhaphy  . VAGINAL WOUND CLOSURE / REPAIR  1999, 2000  . VAGINAL WOUND CLOSURE / REPAIR      Assessment & Plan Clinical Impression: Patient is a59 y.o.LH femalewith history of HTN, GERD, RA, lung nodule who was admitted to [facility] on 04/24/17 with multiple falls and MRI brain without acute findings and cervical spondylosis with central protrusion at C4/5. She was sent home and started developing left sided weakness withleft facial weakness and slurred speech. 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 MRA brain/neck was negative for occlusion or stenosis. 2D echo with EF 50-55% and grade 1 diastolic dysfunction. Stroke felt to be due to small vessel disease and Dr. Doy Mince recommended low dose ASA and plavix for secondary stroke prevention. She did have increase in weakness LUE on 4/30 and was treated with fluid bolus. She was started on dysphagia 3, thin liquids with aspiration precautions and educated on OME. Patient with resultant left sided weakness, right lean and decreased in ability to WB on LLE. She has had significant decline in mobility and ability to carry out ADL tasks.  Patient transferred to CIR on 04/27/2017 .   Patient currently requires max with mobility secondary to muscle weakness and muscle paralysis, decreased cardiorespiratoy endurance, impaired timing  and sequencing, abnormal tone, unbalanced muscle activation and decreased coordination and decreased sitting balance, decreased standing balance, decreased postural control, hemiplegia and decreased balance strategies.  Prior to hospitalization, patient was independent  with mobility and lived with Daughter, Family in a House home.  Home access is 3  steps .  Patient will benefit from skilled PT intervention to maximize safe functional mobility, minimize fall  risk and decrease caregiver burden for planned discharge home with 24 hour supervision.  Anticipate patient will benefit from follow up [place] at discharge.  PT - End of Session Activity Tolerance: Tolerates 10 - 20 min activity with multiple rests Endurance Deficit: Yes PT Assessment Rehab Potential (ACUTE/IP ONLY): Good Barriers to Discharge: Inaccessible home environment;Decreased caregiver support PT Patient demonstrates impairments in the following area(s): Balance;Behavior;Endurance;Nutrition;Motor;Safety;Sensory;Skin Integrity;Perception PT Transfers Functional Problem(s): Bed Mobility;Bed to Chair;Car;Furniture;Floor PT Locomotion Functional Problem(s): Ambulation;Stairs;Wheelchair Mobility PT Plan PT Intensity: Minimum of 1-2 x/day ,45 to 90 minutes PT Frequency: 5 out of 7 days PT Duration Estimated Length of Stay: 17-21 days  PT Treatment/Interventions: Ambulation/gait training;Balance/vestibular training;Cognitive remediation/compensation;Community reintegration;Discharge planning;Disease management/prevention;DME/adaptive equipment instruction;Functional electrical stimulation;Functional mobility training;Neuromuscular re-education;Pain management;Patient/family education;Psychosocial support;Skin care/wound management;Splinting/orthotics;Stair training;Therapeutic Activities;Therapeutic Exercise;UE/LE Strength taining/ROM;Visual/perceptual remediation/compensation;Wheelchair propulsion/positioning PT Transfers Anticipated Outcome(s): Supervisoin assist with LRAD  PT Locomotion Anticipated Outcome(s): Supervisoin assist wit hLRAD for household distances.  PT Recommendation Follow Up Recommendations: Home health PT Patient destination: Home Equipment Recommended: Rolling walker with 5" wheels;Wheelchair (measurements);Wheelchair cushion (measurements);To be determined  Skilled Therapeutic Intervention PT instructed patient in PT Evaluation and initiated treatment intervention; see  below for results. PT educated patient in [place], rehab potential, rehab goals, and discharge recommendations. Pt required max assist for gait and stair negotiation as listed as well as mod assist for al transfers inclduing sit<>stand and squat pivot to and from [place]. Patient returned too room and left sitting in [facility] with call bell in reach and all needs met.      PT Evaluation Precautions/Restrictions Precautions Precautions: Fall Precaution Comments: L hemiplegia Restrictions Weight Bearing Restrictions: No General   Vital Signs  Pain   0/10  Home Living/Prior Functioning Home Living Available Help at Discharge: Family;Available PRN/intermittently Type of Home: House Home Access: Stairs to enter [employer] of Steps: 3 Entrance Stairs-Rails: Right;Left Home Layout: One level Bathroom Shower/Tub: Product/process development scientist: Standard  Lives With: Daughter;Family Prior Function Level of Independence: Independent with basic ADLs;Independent with homemaking with ambulation;Independent with homemaking with wheelchair  Able to Take Stairs?: Yes Driving: Yes 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 with shopping and yardwork Leisure: Hobbies-yes (Comment) Comments: Gardening, yardwork Vision/Perception     Cognition Overall Cognitive Status: Within Functional Limits for tasks assessed Arousal/Alertness: Awake/alert Memory: Appears intact Awareness: Appears intact Problem Solving: Appears intact Behaviors: Lability Safety/Judgment: Appears intact Sensation Sensation Light Touch: Appears Intact Proprioception: Impaired by gross assessment Coordination Gross Motor Movements are Fluid and Coordinated: No Fine Motor Movements are Fluid and Coordinated: No Coordination and Movement Description: pt moves LLE and LUE in flexor synergy pattern.  Motor  Motor Motor: Hemiplegia;Abnormal tone Motor - Skilled Clinical Observations:  flexor tone LLE and LUE, hemiplegia  Mobility Bed Mobility Bed Mobility: Rolling Right;Rolling Left;Supine to Sit;Sit to Supine Rolling Right: 4: Min assist Rolling Right Details: Visual cues/gestures for sequencing;Verbal cues for sequencing;Manual facilitation for placement;Verbal cues for precautions/safety Rolling Left: 4: Min assist Rolling Left Details: Verbal cues for precautions/safety;Verbal cues for technique;Verbal cues for sequencing;Visual cues/gestures for sequencing;Manual facilitation for placement Left Sidelying to  Sit: 4: Min assist Supine to Sit: 3: Mod assist Supine to Sit Details: Verbal cues for technique;Verbal cues for precautions/safety;Visual cues/gestures for sequencing;Verbal cues for sequencing;Visual cues for safe use of DME/AE;Visual cues/gestures for precautions/safety;Verbal cues for safe use of DME/AE Sit to Supine: 4: Min assist Sit to Supine - Details: Manual facilitation for placement;Verbal cues for technique;Verbal cues for precautions/safety;Visual cues/gestures for sequencing;Verbal cues for sequencing Transfers Transfers: Yes Sit to Stand: 3: Mod assist Sit to Stand Details: Verbal cues for technique;Verbal cues for precautions/safety;Verbal cues for sequencing;Verbal cues for safe use of DME/AE Stand Pivot Transfers: 3: Mod assist Stand Pivot Transfer Details: Verbal cues for technique;Verbal cues for precautions/safety;Visual cues/gestures for sequencing;Verbal cues for sequencing;Manual facilitation for weight shifting Squat Pivot Transfers: 3: Mod assist Squat Pivot Transfer Details: Verbal cues for technique;Verbal cues for precautions/safety;Verbal cues for sequencing;Visual cues/gestures for sequencing;Manual facilitation for weight shifting Locomotion  Ambulation Ambulation: Yes Ambulation/Gait Assistance: 2: Max assist;1: +1 Total assist Ambulation Distance (Feet): 20 Feet Assistive device: None Ambulation/Gait Assistance Details: Verbal  cues for precautions/safety;Verbal cues for technique;Verbal cues for gait pattern;Verbal cues for safe use of DME/AE;Verbal cues for sequencing;Visual cues/gestures for sequencing;Visual cues for safe use of DME/AE;Visual cues/gestures for precautions/safety;Manual facilitation for weight shifting;Manual facilitation for placement;Manual facilitation for weight bearing Gait Gait: Yes Gait Pattern: Impaired Gait Pattern: Step-to pattern;Left steppage;Decreased weight shift to left;Lateral trunk lean to left Stairs / Additional Locomotion Stairs: Yes Stairs Assistance: 2: Max assist Stairs Assistance Details: Verbal cues for precautions/safety;Verbal cues for gait pattern;Verbal cues for technique;Manual facilitation for weight shifting;Verbal cues for safe use of DME/AE;Visual cues/gestures for sequencing;Tactile cues for weight beaing;Verbal cues for sequencing Stair Management Technique: One rail Right Number of Stairs: 2 Height of Stairs: 3 Wheelchair Mobility Wheelchair Mobility: No (Pt unable to utilize WC at evaluation)  Trunk/Postural Assessment  Cervical Assessment Cervical Assessment: Within Functional Limits Thoracic Assessment Thoracic Assessment: Exceptions to [facility] (Rounded shoulders) Lumbar Assessment Lumbar Assessment: Exceptions to [facility] (Posterior pelvic tilt) Postural Control Postural Control: Deficits on evaluation (Delayed righting reactions in standing)  Balance Balance Balance Assessed: Yes Dynamic Sitting Balance Dynamic Sitting - Balance Support: During functional activity Dynamic Sitting - Level of Assistance: 4: Min assist Dynamic Sitting - Balance Activities: Lateral lean/weight shifting;Reaching across midline;Reaching for weighted objects Static Standing Balance Static Standing - Balance Support: During functional activity Static Standing - Level of Assistance: 2: Max assist Dynamic Standing Balance Dynamic Standing - Level of Assistance: 2: Max  assist Extremity Assessment      RLE Assessment RLE Assessment: Within Functional Limits LLE Assessment LLE Assessment: Exceptions to WFL LLE AROM (degrees) LLE Overall AROM Comments: decreased hip extension, knee flexion, and ankle DF.  LLE Strength LLE Overall Strength Comments: hip flexion 3+/5, Hip extension 3+/5,  knee extension 4-/5, knee flexion 3+/5, hip abduction/adduction 4-/5, ankle DF 2+/5, ankle PF 3/5,    See Function Navigator for Current Functional Status.   Refer to Care Plan for Long Term Goals  Recommendations for other services: Neuropsych  Discharge Criteria: Patient will be discharged from PT if patient refuses treatment 3 consecutive times without medical reason, if treatment goals not met, if there is a change in medical status, if patient makes no progress towards goals or if patient is discharged from hospital.  The above assessment, treatment plan, treatment alternatives and goals were discussed and mutually agreed upon: by patient  Lorie Phenix 04/28/2017, 7:57 AM

## 2017-04-28 NOTE — Evaluation (Signed)
Speech Language Pathology Assessment and Plan  Patient Details  Name: Donna Rhodes MRN: [id number] Date of Birth: [birth year]  SLP Diagnosis: Cognitive Impairments;Dysphagia  Rehab Potential:   ELOS: 2-2.5 weeks but may be shorter for SLP    Today's Date: 04/28/2017 SLP Individual Time: 1400-1500 SLP Individual Time Calculation (min): 60 min   Problem List:  Patient Active Problem List   Diagnosis Date Noted  . Rheumatoid arthritis involving both hands ([place]) 04/28/2017  . 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Donna Rhodes) 04/27/2017  . CVA (cerebral vascular accident) (Donna Rhodes) 04/24/2017  . LGSIL Pap smear of vagina 12/15/2016  . Anal skin tag 02/02/2016  . Inflammatory arthritis 01/19/2016  . Right knee pain 01/19/2016  . Genital herpes 10/20/2015  . GERD (gastroesophageal reflux disease) 10/20/2015  . Status post vaginal hysterectomy 10/20/2015  . Health care maintenance 05/30/2015  . LLQ pain 03/31/2014  . Menopausal symptoms 03/31/2014  . Hypercholesterolemia 12/31/2012  . Hypothyroidism 12/29/2012  . Osteoarthritis 12/29/2012  . PULMONARY NODULE 01/02/2008  . Depression 12/30/2007  . Essential hypertension 12/30/2007   Past Medical History:  Past Medical History:  Diagnosis Date  . Atrophic vaginitis   . Dysplasia of vagina    vin 1 - vulva and vaginal cuff  . GERD (gastroesophageal reflux disease)   . HCV infection   . HSV (herpes simplex virus) infection   . Hypercholesterolemia   . Hypertension   . Hypothyroidism   . Lung nodule    stable  . Menopausal state   . Osteoarthritis    hand involvement, cervical disc disease  . Osteoporosis   . Pelvic pain in female   . Rheumatoid arthritis (Donna Rhodes)   . Thyroid nodule   . Vaginal Pap smear, abnormal    lgsil   Past Surgical History:  Past Surgical History:  Procedure Laterality Date  . ADENOIDECTOMY    . APPENDECTOMY  1965  . Donna Rhodes   left  . CHOLECYSTECTOMY    . Donna Rhodes  . laser vein  surgery    . MANDIBLE RECONSTRUCTION  1992  . TOENAIL EXCISION  1998   several  . [place]  . VAGINAL HYSTERECTOMY  1983   als0- anterior/posterior colporrhaphy  . VAGINAL WOUND CLOSURE / REPAIR  1999, 2000  . VAGINAL WOUND CLOSURE / REPAIR      Assessment / Plan / Recommendation Clinical Impression Patient is a 71 y.o.LH femalewith history of HTN, GERD, RA, lung nodule who was admitted to [facility] on 04/24/17 with multiple falls and MRI brain without acute findings and cervical spondylosis with central protrusion at C4/5. She was sent home and started developing left sided weakness withleft facial weakness and slurred speech. 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 MRA brain/neck was negative for occlusion or stenosis. 2D echo with EF 50-55% and grade 1 diastolic dysfunction. Stroke felt to be due to small vessel disease and Dr. Doy Mince recommended low dose ASA and plavix for secondary stroke prevention. She did have increase in weakness LUE on 4/30 and was treated with fluid bolus. She was started on dysphagia 3, thin liquids with aspiration precautions. Patient with resultant left sided weakness, right lean and decreased in ability to WB on LLE. She has had significant decline in mobility and ability to carry out ADL tasks. CIR recommended for follow up therapy and patient admitted 04/27/17.  Patient demonstrates mild cognitive impairments impacting attention to  left field of environment and complex problem solving which impacts her ability to complete functional and familiar tasks safely. Patient consumed thin liquids via straw without overt s/s of aspiration and demonstrated efficient mastication and oral clearance with regular textures. However, patient reports buccal pocketing with regular textures. Therefore, recommend patient continue current diet of Dys. 3 textures with trial tray of regular  textures at next scheduled session. Patient would benefit from skilled SLP intervention to maximize her cognitive and swallowing function and overall functional independence prior to discharge.    Skilled Therapeutic Interventions          Administered a BSE and cognitive-linguistic evaluation. Please see above for details.   SLP Assessment  Patient will need skilled Speech Lanaguage Pathology Services during CIR admission    Recommendations  SLP Diet Recommendations: Dysphagia 3 (Mech soft);Thin Liquid Administration via: Cup;Straw Medication Administration: Whole meds with liquid Supervision: Patient able to self feed;Full supervision/cueing for compensatory strategies Compensations: Slow rate;Small sips/bites Postural Changes and/or Swallow Maneuvers: Seated upright 90 degrees;Upright 30-60 min after meal Oral Care Recommendations: Oral care BID Recommendations for Other Services: Neuropsych consult Patient destination: Home Follow up Recommendations: None Equipment Recommended: None recommended by SLP    SLP Frequency 3 to 5 out of 7 days   SLP Duration  SLP Intensity  SLP Treatment/Interventions 2-2.5 weeks but may be shorter for SLP  Minumum of 1-2 x/day, 30 to 90 minutes  Cognitive remediation/compensation;Cueing hierarchy;Internal/external aids;Environmental controls;Functional tasks;Patient/family education;Therapeutic Activities;Dysphagia/aspiration precaution training    Pain No/Denies Pain   Function:  Eating Eating   Modified Consistency Diet: Yes Eating Assist Level: Swallowing techniques: self managed;Set up assist for;Helper checks for pocketed food   Eating Set Up Assist For: Opening containers       Cognition Comprehension Comprehension assist level: Follows complex conversation/direction with no assist  Expression   Expression assist level: Expresses complex ideas: With extra time/assistive device  Social Interaction Social Interaction assist level:  Interacts appropriately 90% of the time - Needs monitoring or encouragement for participation or interaction.  Problem Solving Problem solving assist level: Solves basic problems with no assist  Memory Memory assist level: More than reasonable amount of time   Short Term Goals: Week 1: SLP Short Term Goal 1 (Week 1): Patient will demonstrate efficient mastication and oral clearnace with trials of regular textures without overt s/s of aspiration with Mod I.  SLP Short Term Goal 2 (Week 1): Patient will demonstrate complex problem solving for functional tasks with Mod I.  SLP Short Term Goal 3 (Week 1): Patient will attend to left field of enviornment during functional tasks with Mod I.   Refer to Care Plan for Long Term Goals  Recommendations for other services: Neuropsych  Discharge Criteria: Patient will be discharged from SLP if patient refuses treatment 3 consecutive times without medical reason, if treatment goals not met, if there is a change in medical status, if patient makes no progress towards goals or if patient is discharged from hospital.  The above assessment, treatment plan, treatment alternatives and goals were discussed and mutually agreed upon: by patient and by family  Amiria Orrison 04/28/2017, 3:35 PM

## 2017-04-28 NOTE — Progress Notes (Signed)
Initial Nutrition Assessment  DOCUMENTATION CODES:   Not applicable  INTERVENTION:  Continue Ensure Enlive po BID, each supplement provides 350 kcal and 20 grams of protein.  Discontinue Prostat.  Encourage adequate PO intake.   NUTRITION DIAGNOSIS:   Inadequate oral intake related to poor appetite as evidenced by per patient/family report.  GOAL:   Patient will meet greater than or equal to 90% of their needs  MONITOR:   PO intake, Supplement acceptance, Labs, Weight trends, Skin, I & O's  REASON FOR ASSESSMENT:   Malnutrition Screening Tool    ASSESSMENT:   71 y.o. LH female with history of HTN, GERD, RA, lung nodule who was admitted to [facility] on 04/24/17 with multiple falls and MRI brain without acute findings and cervical spondylosis with central protrusion at C4/5. She was sent home and started developing left sided weakness with left facial weakness and slurred speech.  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   Meal completion has been 80-90%. Pt reports having a decreased appetite since admission. She reports eating well however with usual consumption of at least 2 meals a day with snacks in between. Pt reports a 4 lb weight loss since admit. Pt with a reported 3.2% weight loss in 4 days. Pt currently has Ensure and Prostat ordered and has been consuming them. RD to discontinue Prostat as pt will be meeting her estimated nutrition needs with just Ensure. Pt educated on the importance of adequate caloric and protein needs.   Pt with no observed significant fat or muscle mass loss.   Labs and medications reviewed.   Diet Order:  DIET DYS 3 Room service appropriate? Yes; Fluid consistency: Thin  Skin:  Reviewed, no issues  Last BM:  5/1  Height:   Ht Readings from Last 1 Encounters:  04/27/17 5\' 3"  (1.6 m)    Weight:   Wt Readings from Last 1 Encounters:  04/27/17 121 lb 11.2  oz (55.2 kg)    Ideal Body Weight:  52.27 kg  BMI:  Body mass index is 21.56 kg/m.  Estimated Nutritional Needs:   Kcal:  1500-1800  Protein:  65-75 grams  Fluid:  >/= 1.5 L/day  EDUCATION NEEDS:   Education needs addressed  Corrin Parker, MS, RD, LDN Pager # [phone] After hours/ weekend pager # [phone]

## 2017-04-28 NOTE — Patient Care Conference (Signed)
Inpatient RehabilitationTeam Conference and Plan of Care Update Date: 04/28/2017   Time: 11:00 AM    Patient Name: Donna Rhodes      Medical Record Number: [id number]  Date of Birth: 1946-05-16 Sex: Female         Room/Bed: 4W23C/4W23C-01 Payor Info: Payor: [employer] ADVANTAGE / Plan: Tennis Must / Product Type: *No Product type* /    Admitting Diagnosis: R CVA  Admit Date/Time:  04/27/2017  2:37 PM Admission Comments: No comment available   Primary Diagnosis:  Right-sided lacunar infarction [facility]) Principal Problem: Right-sided lacunar infarction [facility])  Patient Active Problem List   Diagnosis Date Noted  . Rheumatoid arthritis involving both hands ([place]) 04/28/2017  . 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[place]) 04/27/2017  . CVA (cerebral vascular accident) ([place]) 04/24/2017  . LGSIL Pap smear of vagina 12/15/2016  . Anal skin tag 02/02/2016  . Inflammatory arthritis 01/19/2016  . Right knee pain 01/19/2016  . Genital herpes 10/20/2015  . GERD (gastroesophageal reflux disease) 10/20/2015  . Status post vaginal hysterectomy 10/20/2015  . Health care maintenance 05/30/2015  . LLQ pain 03/31/2014  . Menopausal symptoms 03/31/2014  . Hypercholesterolemia 12/31/2012  . Hypothyroidism 12/29/2012  . Osteoarthritis 12/29/2012  . PULMONARY NODULE 01/02/2008  . Depression 12/30/2007  . Essential hypertension 12/30/2007    Expected Discharge Date:    Team Members Present: Physician leading conference: Dr. Alysia Penna Social Worker Present: Ovidio Kin, LCSW Nurse Present: Other (comment) Haywood Lasso Evans-RN) PT Present: Dwyane Dee, PT OT Present: Cherylynn Ridges, OT SLP Present: Weston Anna, SLP PPS Coordinator present : Daiva Nakayama, RN, CRRN     Current Status/Progress Goal Weekly Team Focus  Medical     stability   RA issues and L-shoulder pain adjusting meds.     Bowel/Bladder   Continent of bladder, no bowel movement this shift, patient is on Docusate  for bowel  Remain continent  Assess for constipation qshift, use Sorbitol PRN to prevent constipation   Swallow/Nutrition/ Hydration   Eval Pending         ADL's   Max A overall  Mod I/supervision BADL, min A supervsision   L NMR, e-stem, transfer training, modified bathing/dressing, improved sit<>stand   Mobility     eval pending        Communication   Eval Pending          Safety/Cognition/ Behavioral Observations  Eval Pending          Pain   Denies pain and discomfort  To keep patient pain free  Assess for pain qshift and PRN   Skin                *See Care Plan and progress notes for long and short-term goals.  Barriers to Discharge:   medical stability-managing pain from RA   Possible Resolutions to Barriers:    none   Discharge Planning/Teaching Needs:    Home to her home with daughter's assisting with her care and rotating care. Plan to be here daily to provide support and see progress.     Team Discussion:  New eval-OT feels 2.5-3 weeks goals of supervision-min assist level. PT yet to eval. Will need neuro-psych for coping due to somewhat down. MD aware of her left shoulder pain-need to stabilize with arm trough or lap tray.  Revisions to Treatment Plan:  New eval      Elease Hashimoto 04/28/2017, 3:49 PM

## 2017-04-28 NOTE — Progress Notes (Signed)
Social Work Assessment and Plan Social Work Assessment and Plan  Patient Details  Name: Donna Rhodes MRN: [id number] Date of Birth: 05-05-1946  Today's Date: 04/28/2017  Problem List:  Patient Active Problem List   Diagnosis Date Noted  . Rheumatoid arthritis involving both hands ([place]) 04/28/2017  . 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[place]) 04/27/2017  . CVA (cerebral vascular accident) ([place]) 04/24/2017  . LGSIL Pap smear of vagina 12/15/2016  . Anal skin tag 02/02/2016  . Inflammatory arthritis 01/19/2016  . Right knee pain 01/19/2016  . Genital herpes 10/20/2015  . GERD (gastroesophageal reflux disease) 10/20/2015  . Status post vaginal hysterectomy 10/20/2015  . Health care maintenance 05/30/2015  . LLQ pain 03/31/2014  . Menopausal symptoms 03/31/2014  . Hypercholesterolemia 12/31/2012  . Hypothyroidism 12/29/2012  . Osteoarthritis 12/29/2012  . PULMONARY NODULE 01/02/2008  . Depression 12/30/2007  . Essential hypertension 12/30/2007   Past Medical History:  Past Medical History:  Diagnosis Date  . Atrophic vaginitis   . Dysplasia of vagina    vin 1 - vulva and vaginal cuff  . GERD (gastroesophageal reflux disease)   . HCV infection   . HSV (herpes simplex virus) infection   . Hypercholesterolemia   . Hypertension   . Hypothyroidism   . Lung nodule    stable  . Menopausal state   . Osteoarthritis    hand involvement, cervical disc disease  . Osteoporosis   . Pelvic pain in female   . Rheumatoid arthritis ([place])   . Thyroid nodule   . Vaginal Pap smear, abnormal    lgsil   Past Surgical History:  Past Surgical History:  Procedure Laterality Date  . ADENOIDECTOMY    . APPENDECTOMY  1965  . [place]   left  . CHOLECYSTECTOMY    . [place]  . laser vein surgery    . MANDIBLE RECONSTRUCTION  1992  . TOENAIL EXCISION  1998   several  . [place]  . VAGINAL HYSTERECTOMY  1983   als0- anterior/posterior colporrhaphy  .  VAGINAL WOUND CLOSURE / REPAIR  1999, 2000  . VAGINAL WOUND CLOSURE / REPAIR     Social History:  reports that she quit smoking about 37 years ago. She has never used smokeless tobacco. She reports that she does not drink alcohol or use drugs.  Family / Support Systems Marital Status: Divorced Patient Roles: Parent, Caregiver Children: Amy Clontz-daughter [phone]  Manuela Schwartz [place]-daughter [phone] Other Supports: Julie-daughter whom lives with pt Anticipated Caregiver: Daughter's Ability/Limitations of Caregiver: Two oldest daughter's-Susan and Amy will be taking turns and hopefully Almyra Free will assist also Caregiver Availability: 24/7 Family Dynamics: Close knit family who have always done for one another. Pt has been very active and independent and helped others, she is not used to being in this role and doesn't really like it. She is hoping she will do well here and make good progress while here.  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   Abuse/Neglect Physical Abuse: Denies Verbal Abuse: Denies Sexual Abuse: Denies Exploitation of patient/patient's resources: Denies Self-Neglect: Denies  Emotional Status Pt's affect, behavior adn adjustment status: Pt has always been independent and taken care  of others she does not like asking for help and tries not too. Her daughter confirms she is very independent and wants to do for herself. Pt lives alone and wants to get back to this level. Recent Psychosocial Issues: other health issues were managed-was in ER twice before admitted for  CVA Pyschiatric History: History of depression was taking meds but felt they did not help that much. With her history feel she would benefit from seeing neuro-psych while here for support and coping. Will make referral. Substance Abuse History: History of tobacco  Patient / Family Perceptions, Expectations & Goals Pt/Family understanding of illness & functional limitations: Pt and daughter can explain her stroke and deficits. Her daughter-Amy is a Therapist, sports and works at [employer]. Both feel their questions and concerns have been addressed so far. All pt's daughter's plan to be here often and be involved in her recovery. Premorbid pt/family roles/activities: Mom, retiree, daughter, retiree, home owner, church member, etc Anticipated changes in roles/activities/participation: resume Pt/family expectations/goals: Pt states: " I want to be able to take care of myself when I leave here."  Amy states: " We will do whatever is needed and will be there one of [place]."  [employer]: None Premorbid Home Care/DME Agencies: None Transportation available at discharge: [employer] referrals recommended: Neuropsychology, Support group (specify)  Discharge Planning Living Arrangements: Alone Support Systems: Children, Armed forces technical officer, Water engineer, Social worker community Type of Residence: Private residence Insurance Resources: Multimedia programmer (specify) (Health Team Advantage) Financial Resources: Social Security, Other (Comment) (pension from [facility] [facility]) Financial Screen Referred: No Living Expenses: Own Money Management: Patient Does the patient have any problems obtaining your medications?: No Home Management: Self Patient/Family Preliminary Plans: Return home with her daughter's assisting with her care. They plan to be here daily to provide support and see her progress in therapies. Aware team evaluating and setting goals and not able to have first team conference. Will meet next week and  then have a target discharge date to focus on. Social Work Anticipated Follow Up Needs: HH/OP, Support Group  Clinical Impression Pleasant quiet female who allows her daughter to speak for her and seems to take it all in. She does verbalize she wants to get back to her independent level and take care of herself. She does not want to feel like a burden To her daughter's. Will make neuro-psych referral so she can be seen while here for coping and support. Will await team's evaluations.  Elease Hashimoto 04/28/2017, 1:44 PM

## 2017-04-28 NOTE — Progress Notes (Signed)
Subjective/Complaints: Discussed RA symptoms, hand and LE doing ok, has occ pain that can come on quickly and be disabling if she doesn't get MTX dose, was place on MTX injection due to GERD with oral MTX  ROS-  + cough, non prod, no fever or chills, - N/V/D, CP or SOB  Objective: Vital Signs: Blood pressure 123/66, pulse 80, temperature 98.2 F (36.8 C), temperature source Oral, resp. rate 18, height _0  (1.6 m), weight 55.2 kg (121 lb 11.2 oz), last menstrual period 12/29/1981, SpO2 97 %. Dg Chest 2 View  Result Date: 04/27/2017 CLINICAL DATA:  Cough since last night. EXAM: CHEST  2 VIEW COMPARISON:  Chest CT 09/23/2010 FINDINGS: Lungs are adequately inflated without consolidation or effusion. Cardiomediastinal silhouette is within normal bones and soft tissues are unremarkable. IMPRESSION: No active cardiopulmonary disease. Electronically Signed   By: Marin Olp M.D.   On: 04/27/2017 18:09   Results for orders placed or performed during the hospital encounter of 04/27/17 (from the past 72 hour(s))  CBC WITH DIFFERENTIAL     Status: None   Collection Time: 04/28/17  7:14 AM  Result Value Ref Range   WBC 6.6 4.0 - 10.5 K/uL   RBC 4.10 3.87 - 5.11 MIL/uL   Hemoglobin 12.5 12.0 - 15.0 g/dL   HCT 38.5 36.0 - 46.0 %   MCV 93.9 78.0 - 100.0 fL   MCH 30.5 26.0 - 34.0 pg   MCHC 32.5 30.0 - 36.0 g/dL   RDW 14.8 11.5 - 15.5 %   Platelets 242 150 - 400 K/uL   Neutrophils Relative % 66 %   Neutro Abs 4.4 1.7 - 7.7 K/uL   Lymphocytes Relative 21 %   Lymphs Abs 1.4 0.7 - 4.0 K/uL   Monocytes Relative 7 %   Monocytes Absolute 0.5 0.1 - 1.0 K/uL   Eosinophils Relative 5 %   Eosinophils Absolute 0.3 0.0 - 0.7 K/uL   Basophils Relative 1 %   Basophils Absolute 0.0 0.0 - 0.1 K/uL  Comprehensive metabolic panel     Status: Abnormal   Collection Time: 04/28/17  7:14 AM  Result Value Ref Range   Sodium 139 135 - 145 mmol/L   Potassium 3.6 3.5 - 5.1 mmol/L   Chloride 106 101 - 111 mmol/L    CO2 24 22 - 32 mmol/L   Glucose, Bld 97 65 - 99 mg/dL   BUN 14 6 - 20 mg/dL   Creatinine, Ser 0.76 0.44 - 1.00 mg/dL   Calcium 8.9 8.9 - 10.3 mg/dL   Total Protein 6.6 6.5 - 8.1 g/dL   Albumin 3.3 (L) 3.5 - 5.0 g/dL   AST 33 15 - 41 U/L   ALT 28 14 - 54 U/L   Alkaline Phosphatase 96 38 - 126 U/L   Total Bilirubin 0.7 0.3 - 1.2 mg/dL   GFR calc non Af Amer >60 >60 mL/min   GFR calc Af Amer >60 >60 mL/min    Comment: (NOTE) The eGFR has been calculated using the CKD EPI equation. This calculation has not been validated in all clinical situations. eGFR's persistently <60 mL/min signify possible Chronic Kidney Disease.    Anion gap 9 5 - 15     HEENT: scalp abrasion left pariental with dermabond non tender  Cardio: RRR and no murmur or ES Resp: CTA B/L and unlabored GI: BS positive and NT, ND Extremity:  Pulses positive and No Edema Skin:   Other see above Neuro: Alert/Oriented, Normal Sensory, Abnormal  Motor 0/5 in Left delt , bi, tri , grip, 3- L HF, KE, 0 ADF, Abnormal FMC Tone  indreasing Left finger flexor tone MAS 1 and Tone:  Hypertonia Musc/Skel:  Other Left shoulder subluxation , Lbilateral hand MCP adn PIP chronic swelling no acute synovitis Gen NAD   Assessment/Plan: 1. Functional deficits secondary to Left hemiparesis which require 3+ hours per day of interdisciplinary therapy in a comprehensive inpatient rehab setting. Physiatrist is providing close team supervision and 24 hour management of active medical problems listed below. Physiatrist and rehab team continue to assess barriers to discharge/monitor patient progress toward functional and medical goals. FIM:       Function - Toileting Toileting steps completed by patient: Performs perineal hygiene Toileting steps completed by helper: Adjust clothing prior to toileting, Adjust clothing after toileting Assist level: Two helpers  Function - Air cabin crew transfer assistive device: Bedside  commode Assist level to bedside commode (at bedside): 2 helpers Assist level from bedside commode (at bedside): 2 helpers        Function - Comprehension Comprehension: Auditory Comprehension assist level: Follows complex conversation/direction with extra time/assistive device  Function - Expression Expression: Verbal Expression assist level: Expresses complex ideas: With extra time/assistive device  Function - Social Interaction Social Interaction assist level: Interacts appropriately with others - No medications needed.  Function - Problem Solving Problem solving assist level: Solves complex problems: With extra time  Function - Memory Memory assist level: More than reasonable amount of time Patient normally able to recall (first 3 days only): Current season, Location of own room, Staff names and faces, That he or she is in a hospital   Medical Problem List and Plan: 1. 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 DVT Prophylaxis/Anticoagulation: Pharmaceutical: Lovenox 3. Pain Management: tylenol prn 4. Mood: Seems flat--question fatigue. Team to provide ego support. LCSW to follow for evaluation and support.  -neuropsych for mood assessment 5. Neuropsych: This patient iscapable of making decisions on herown behalf. 6. Skin/Wound Care: Monitor I/O 7. Fluids/Electrolytes/Nutrition: Monitor I/O. Check lytes in am.  8. HTN: Montior BP bid. On Norvasc daily 9. Dyslipidemia: Continue Lipitor.  10. Hypothyroid: On supplement 11. RA: Managed with methotrexate injections weekly.  12. Anorexia: Will liberalize diet to regular to help with food choices.  13. Persistent productive cough, ?recent aspiration, right lower-mid rales on exam -IS -Negative CXR 14.  Hypoalb- mild start prostat LOS (Days) 1 A FACE TO FACE EVALUATION WAS PERFORMED  KIRSTEINS,ANDREW  E 04/28/2017, 8:28 AM

## 2017-04-28 NOTE — Telephone Encounter (Signed)
Patient is in currently admitted to [facility] inpatient rehab will not be TCM eligible until released from skill nursing.

## 2017-04-29 ENCOUNTER — Inpatient Hospital Stay (HOSPITAL_COMMUNITY): Payer: PPO | Admitting: Occupational Therapy

## 2017-04-29 ENCOUNTER — Inpatient Hospital Stay (HOSPITAL_COMMUNITY): Payer: PPO | Admitting: Physical Therapy

## 2017-04-29 ENCOUNTER — Encounter (HOSPITAL_COMMUNITY): Payer: Self-pay

## 2017-04-29 ENCOUNTER — Inpatient Hospital Stay (HOSPITAL_COMMUNITY): Payer: PPO | Admitting: Speech Pathology

## 2017-04-29 DIAGNOSIS — R002 Palpitations: Secondary | ICD-10-CM | POA: Diagnosis not present

## 2017-04-29 NOTE — Progress Notes (Signed)
Occupational Therapy Session Note  Patient Details  Name: Donna Rhodes MRN: [id number] Date of Birth: 01-28-1946  Today's Date: 04/29/2017  Session 1 OT Individual Time: 0802-0900 OT Individual Time Calculation (min): 58 min   Session 2 OT Individual Time: 1300-1330 OT Individual Time Calculation (min): 30 min    Short Term Goals: Week 1:  OT Short Term Goal 1 (Week 1): Pt will complete LB dressing with MOD A OT Short Term Goal 2 (Week 1): Pt will complete UB dressing using hemi-techniques OT Short Term Goal 3 (Week 1): Pt will maintain dynamic standing balance with mod A x1 in preparation for ADLa OT Short Term Goal 4 (Week 1): Pt will demonstrate self-ROM on L UE with min cues  Skilled Therapeutic Interventions/Progress Updates:    Session 1 OT treatment session focused on modified dressing, sit<>stand, standing balance, and L NMR. Pt educated on hemi-techniques for UB and LB dressing. Pt required min A to place L UE into shirt and vc for technique. Mirror feedback used to remind where to fix shirt to pull over trunk. Sit<>stand with mod A, Mod A standing balance to remove R UE from sink to assist with taking off pants. Min/ A to thread new pants with similar assist sit<>stand and assist to pull pants over hips. Pt was educated on one-handed grooming modifications and  brushed teeth in sitting with set-up A. Pt then brought to therapy gym and K-tape applied to L shoulder. L NMR focused on pronation/supination with active forarm firing noted.  Increased flexor synergy pattern.  .   Session 2 OT treatment session focused on L NMR.Bridgeing for core strengthening and gluteal activation, stabilization for L LE, but able to clean bottom from bed. Gross motor function to elicit activiation in scap and bicep, trace movement in scap protraction and retraction, some elvation and depression. Pt brought into gravity eliminated side-lying position for NMre-ed to elicit proximal to distal movement-  range out of extensor tone. PNF patterns in side lying with joint input to bring pt through ROM, pt with more proximal movemen with pain with shoulder flexion ~90 degrees, no UE pain at rest. Donned resting hand splint and left pt semi-reclined in bed with needs met and daughter present.   See Function Navigator for Current Functional Status.   Therapy/Group: Individual Therapy  Valma Cava 04/29/2017, 3:50 PM

## 2017-04-29 NOTE — Progress Notes (Signed)
Subjective/Complaints: Pt doesn't like taking pills concerned about statin side effects, discussed elevated cholesterol and LDL  ROS-  + cough, non prod, no fever or chills, - N/V/D, CP or SOB  Objective: Vital Signs: Blood pressure 132/60, pulse 90, temperature 98.4 F (36.9 C), temperature source Oral, resp. rate 18, height _0  (1.6 m), weight 55.2 kg (121 lb 11.2 oz), last menstrual period 12/29/1981, SpO2 97 %. Dg Chest 2 View  Result Date: 04/27/2017 CLINICAL DATA:  Cough since last night. EXAM: CHEST  2 VIEW COMPARISON:  Chest CT 09/23/2010 FINDINGS: Lungs are adequately inflated without consolidation or effusion. Cardiomediastinal silhouette is within normal bones and soft tissues are unremarkable. IMPRESSION: No active cardiopulmonary disease. Electronically Signed   By: Marin Olp M.D.   On: 04/27/2017 18:09   Results for orders placed or performed during the hospital encounter of 04/27/17 (from the past 72 hour(s))  CBC WITH DIFFERENTIAL     Status: None   Collection Time: 04/28/17  7:14 AM  Result Value Ref Range   WBC 6.6 4.0 - 10.5 K/uL   RBC 4.10 3.87 - 5.11 MIL/uL   Hemoglobin 12.5 12.0 - 15.0 g/dL   HCT 38.5 36.0 - 46.0 %   MCV 93.9 78.0 - 100.0 fL   MCH 30.5 26.0 - 34.0 pg   MCHC 32.5 30.0 - 36.0 g/dL   RDW 14.8 11.5 - 15.5 %   Platelets 242 150 - 400 K/uL   Neutrophils Relative % 66 %   Neutro Abs 4.4 1.7 - 7.7 K/uL   Lymphocytes Relative 21 %   Lymphs Abs 1.4 0.7 - 4.0 K/uL   Monocytes Relative 7 %   Monocytes Absolute 0.5 0.1 - 1.0 K/uL   Eosinophils Relative 5 %   Eosinophils Absolute 0.3 0.0 - 0.7 K/uL   Basophils Relative 1 %   Basophils Absolute 0.0 0.0 - 0.1 K/uL  Comprehensive metabolic panel     Status: Abnormal   Collection Time: 04/28/17  7:14 AM  Result Value Ref Range   Sodium 139 135 - 145 mmol/L   Potassium 3.6 3.5 - 5.1 mmol/L   Chloride 106 101 - 111 mmol/L   CO2 24 22 - 32 mmol/L   Glucose, Bld 97 65 - 99 mg/dL   BUN 14 6 - 20  mg/dL   Creatinine, Ser 0.76 0.44 - 1.00 mg/dL   Calcium 8.9 8.9 - 10.3 mg/dL   Total Protein 6.6 6.5 - 8.1 g/dL   Albumin 3.3 (L) 3.5 - 5.0 g/dL   AST 33 15 - 41 U/L   ALT 28 14 - 54 U/L   Alkaline Phosphatase 96 38 - 126 U/L   Total Bilirubin 0.7 0.3 - 1.2 mg/dL   GFR calc non Af Amer >60 >60 mL/min   GFR calc Af Amer >60 >60 mL/min    Comment: (NOTE) The eGFR has been calculated using the CKD EPI equation. This calculation has not been validated in all clinical situations. eGFR's persistently <60 mL/min signify possible Chronic Kidney Disease.    Anion gap 9 5 - 15     HEENT: scalp abrasion left pariental with dermabond non tender  Cardio: RRR and no murmur or ES Resp: CTA B/L and unlabored GI: BS positive and NT, ND Extremity:  Pulses positive and No Edema Skin:   Other see above Neuro: Alert/Oriented, Normal Sensory, Abnormal Motor 0/5 in Left delt , bi, tri , grip, 3- L HF, KE, 0 ADF, Abnormal FMC Tone  indreasing  Left finger flexor tone MAS 1 and Tone:  Hypertonia Musc/Skel:  Other Left shoulder subluxation , Lbilateral hand MCP adn PIP chronic swelling no acute synovitis Gen NAD   Assessment/Plan: 1. Functional deficits secondary to Left hemiparesis which require 3+ hours per day of interdisciplinary therapy in a comprehensive inpatient rehab setting. Physiatrist is providing close team supervision and 24 hour management of active medical problems listed below. Physiatrist and rehab team continue to assess barriers to discharge/monitor patient progress toward functional and medical goals. FIM: Function - Bathing Position: Shower Body parts bathed by patient: Left arm, Chest, Abdomen, Front perineal area, Right upper leg, Left upper leg Body parts bathed by helper: Left lower leg, Right lower leg, Back, Buttocks Assist Level: Touching or steadying assistance(Pt > 75%)  Function- Upper Body Dressing/Undressing What is the patient wearing?: Pull over shirt/dress Pull  over shirt/dress - Perfomed by patient: Thread/unthread right sleeve Pull over shirt/dress - Perfomed by helper: Thread/unthread left sleeve, Put head through opening, Pull shirt over trunk Assist Level: Touching or steadying assistance(Pt > 75%) Function - Lower Body Dressing/Undressing What is the patient wearing?: Non-skid slipper socks, Pants, Underwear Position: Wheelchair/chair at sink Underwear - Performed by helper: Thread/unthread right underwear leg, Pull underwear up/down, Thread/unthread left underwear leg Pants- Performed by helper: Thread/unthread right pants leg, Thread/unthread left pants leg, Pull pants up/down Non-skid slipper socks- Performed by helper: Don/doff right sock, Don/doff left sock Assist for footwear: Dependant Assist for lower body dressing: Touching or steadying assistance (Pt > 75%)  Function - Toileting Toileting steps completed by patient: Performs perineal hygiene Toileting steps completed by helper: Adjust clothing prior to toileting, Adjust clothing after toileting Toileting Assistive Devices: Grab bar or rail Assist level: Touching or steadying assistance (Pt.75%)  Function - Air cabin crew transfer assistive device: Elevated toilet seat/BSC over toilet, Grab bar Assist level to toilet: Moderate assist (Pt 50 - 74%/lift or lower) Assist level from toilet: Moderate assist (Pt 50 - 74%/lift or lower) Assist level to bedside commode (at bedside): 2 helpers Assist level from bedside commode (at bedside): 2 helpers  Function - Chair/bed transfer Chair/bed transfer method: Squat pivot Chair/bed transfer assist level: Moderate assist (Pt 50 - 74%/lift or lower) Chair/bed transfer assistive device: Armrests Chair/bed transfer details: Verbal cues for precautions/safety, Verbal cues for safe use of DME/AE, Verbal cues for sequencing  Function - Locomotion: Wheelchair Will patient use wheelchair at discharge?: Yes Type: Manual Max wheelchair  distance: 150 Assist Level: Touching or steadying assistance (Pt > 75%) Assist Level: Touching or steadying assistance (Pt > 75%) Assist Level: Touching or steadying assistance (Pt > 75%) Turns around,maneuvers to table,bed, and toilet,negotiates 3% grade,maneuvers on rugs and over doorsills: No Function - Locomotion: Ambulation Assistive device: Walker-rolling, Rail in hallway Max distance: 20 Assist level: Maximal assist (Pt 25 - 49%) Assist level: Maximal assist (Pt 25 - 49%)  Function - Comprehension Comprehension: Auditory, Visual Comprehension assist level: Understands basic 75 - 89% of the time/ requires cueing 10 - 24% of the time  Function - Expression Expression: Verbal, Nonverbal Expression assist level: Expresses complex ideas: With no assist  Function - Social Interaction Social Interaction assist level: Interacts appropriately with others - No medications needed.  Function - Problem Solving Problem solving assist level: Solves complex problems: Recognizes & self-corrects  Function - Memory Memory assist level: More than reasonable amount of time Patient normally able to recall (first 3 days only): Current season, Location of own room, Staff names and faces, That he  or she is in a hospital   Medical Problem List and Plan: 1. 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 DVT Prophylaxis/Anticoagulation: Pharmaceutical: Lovenox 3. Pain Management: tylenol prn 4. Mood: Seems flat--question fatigue. Team to provide ego support. LCSW to follow for evaluation and support.  -neuropsych for mood assessment 5. Neuropsych: This patient iscapable of making decisions on herown behalf. 6. Skin/Wound Care: Monitor I/O 7. Fluids/Electrolytes/Nutrition: Monitor I/O. Check lytes in am.  8. HTN: Montior BP bid. On Norvasc daily 9. Dyslipidemia: Continue Lipitor.  10. Hypothyroid: On  supplement 11. RA: Managed with methotrexate injections weekly.  12. Anorexia: Will liberalize diet to regular to help with food choices.  13. Persistent productive cough, ?recent aspiration, right lower-mid rales on exam -IS -Negative CXR 14.  Hypoalb- mild start prostat LOS (Days) 2 A FACE TO FACE EVALUATION WAS PERFORMED  Kimberleigh Mehan E 04/29/2017, 8:25 AM

## 2017-04-29 NOTE — Progress Notes (Signed)
Physical Therapy Session Note  Patient Details  Name: Donna Rhodes MRN: [id number] Date of Birth: 04-04-1946  Today's Date: 04/29/2017 PT Individual Time: 0904-1004 PT Individual Time Calculation (min): 60 min   Short Term Goals: Week 1:  PT Short Term Goal 1 (Week 1): Pt will perform bedmobility consistently with min assist  PT Short Term Goal 2 (Week 1): Pt will performed bed<>WC transfers with min assist and LRAD  PT Short Term Goal 3 (Week 1): Pt will ambulate 43f with mod assist  PT Short Term Goal 4 (Week 1): Pt will propell WC 1071fwith min assist  PT Short Term Goal 5 (Week 1): Pt will maintain standing balance with min-mod assist for 2 minutes    Skilled Therapeutic Interventions/Progress Updates:   Pt received sitting in WC and agreeable to PT. [facility] mobility in hall x 12076fith min assist for set up and supervision assist for propulsion using hemi technique. Moderate cues for use of Heel of foot and coordination of RUE and RLE to maintain straight path.   Squat pivot [facility] from and moderate cues for set up.  Forced WB through the LUE to lateral lean through L elbow and pushing through UE to return to sitting completed x 8, noted decreased in UE flexor tone following. Sit<>supine with min assist for contol of the LLE. Supine PNF dynamic reversals at pelvis and upper trunk for rolling R and L with emphasis on L side trunk activation.    Sit<>stand with mod assist x 4 with forced WB through the LLE. Standing balance without UE support with mod assist and max cues for L knee terminal extension.   Variable gait training in parallel bars with forward backward progression [id number] max assist from PT. Manual facilitation to encourage full hip extension in stance on the LLE as well as prevent knee buckling. Pt continues to move in    Patient returned too room and left sitting [facility]  [facility] and all needs met.        Therapy Documentation Precautions:  Precautions Precautions: Fall Precaution Comments: L hemiplegia Restrictions Weight Bearing Restrictions: No    Pain: 0/10   See Function Navigator for Current Functional Status.   Therapy/Group: Individual Therapy  AustiLorie Phenix2018, 10:04 AM

## 2017-04-29 NOTE — Progress Notes (Signed)
Speech Language Pathology Daily Session Note  Patient Details  Name: Donna Rhodes MRN: [id number] Date of Birth: [birth year]  Today's Date: 04/29/2017 SLP Individual Time: 1100-1200 SLP Individual Time Calculation (min): 60 min  Short Term Goals: Week 1: SLP Short Term Goal 1 (Week 1): Patient will demonstrate efficient mastication and oral clearnace with trials of regular textures without overt s/s of aspiration with Mod I.  SLP Short Term Goal 2 (Week 1): Patient will demonstrate complex problem solving for functional tasks with Mod I.  SLP Short Term Goal 3 (Week 1): Patient will attend to left field of enviornment during functional tasks with Mod I.   Skilled Therapeutic Interventions: Skilled treatment session focused on cognition and dysphagia goals SLP facilitated session by providing skilled observation of pt consuming trial tray of regular chicken tenders. Pt overall was disengaged in session d/t fatigue and increased depression over remaining physical deficits related to CVA. Pt with previous jaw surgeries and remaining bilateral numbness. Numbness on left has worsened with new CVA. Pt required Mod A multimodal cues to place food on right (pt unable to recall immediately - possibly d/t to disinterest in session). Pt with incomplete oral clearing of all boluses and sat with food and liquid in mouth with eyes closed. Pt refused further intake after 6 boluses d/t fullness. Given pt's overall ability, recommend continuing dysphagia 3 with full supervision until further trials can be assessed. Pt able to demonstrate scanning to left field of environment with Min A cues. She demonstrated ability to solve complex problem solving with Mod to Max encouragement for participation therefore her ability appeared lower. Daughter present, education and encouragement provided. Pt returned to room, left in bed, bed alarm on and all needs within reach. Continue per current plan of care.       Function:  Eating Eating   Modified Consistency Diet: No Eating Assist Level: More than reasonable amount of time;Set up assist for;Supervision or verbal cues;Helper checks for pocketed food   Eating Set Up Assist For: Opening containers       Cognition Comprehension Comprehension assist level: Understands basic 90% of the time/cues < 10% of the time  Expression   Expression assist level: Expresses basic needs/ideas: With extra time/assistive device  Social Interaction Social Interaction assist level: Interacts appropriately 90% of the time - Needs monitoring or encouragement for participation or interaction.  Problem Solving Problem solving assist level: Solves complex problems: With extra time  Memory Memory assist level: More than reasonable amount of time    Pain    Therapy/Group: Individual Therapy   Nohemy Koop B. Rutherford Nail, M.S., [place] 04/29/2017, 12:23 PM

## 2017-04-30 ENCOUNTER — Inpatient Hospital Stay (HOSPITAL_COMMUNITY): Payer: PPO | Admitting: Speech Pathology

## 2017-04-30 ENCOUNTER — Inpatient Hospital Stay (HOSPITAL_COMMUNITY): Payer: PPO | Admitting: Occupational Therapy

## 2017-04-30 ENCOUNTER — Inpatient Hospital Stay (HOSPITAL_COMMUNITY): Payer: PPO | Admitting: Physical Therapy

## 2017-04-30 DIAGNOSIS — M069 Rheumatoid arthritis, unspecified: Secondary | ICD-10-CM

## 2017-04-30 NOTE — Progress Notes (Signed)
Occupational Therapy Session Note  Patient Details  Name: Donna Rhodes MRN: [id number] Date of Birth: [birth year]  Today's Date: 04/30/2017  Session 1 OT Individual Time: 1102-1200 OT Individual Time Calculation (min): 58 min   Session 2 OT Individual Time: 1330-1430 OT Individual Time Calculation (min): 60 min    Short Term Goals: Week 1:  OT Short Term Goal 1 (Week 1): Pt will complete LB dressing with MOD A OT Short Term Goal 2 (Week 1): Pt will complete UB dressing using hemi-techniques OT Short Term Goal 3 (Week 1): Pt will maintain dynamic standing balance with mod A x1 in preparation for ADLa OT Short Term Goal 4 (Week 1): Pt will demonstrate self-ROM on L UE with min cues  Skilled Therapeutic Interventions/Progress Updates:  Session 1   OT treatment session focused on functional transfers, modified bathing/dressing, NMR, and improved sit<>stand/standing balance. Pt greeted sitting in wc and agreeable to OT, but seemingly very down/depressed today, flat affect throughout session. OT asked pt how she was feeling today and pt stated "i'm just really tired." Mood could be attributed to no family present today.  Squat-pivot shower transfer with mod A with facilitation at hips to pivot onto bench. Incorporated NMR with hand-over hand assist to place L UE on grab bar to range out of extensor tone, then facilitated weight bearing and push/pull motion with bathing. Hemi-dressing techniques seated in w/c with Min A to thread L LE through long sleeve shirt and min A to pull shirt over trunk. Pt able to cross L leg over R knee and reach to thread pants, then required min A for sitting balance to reach and thread R pant leg. Min A to power up sit<>stand but mod A + L knee block to maintain standing while puling pants over hips. Set-up A for one-handed grooming sitting at the sink, then requested to return to bed with Mod A stand-pivot. Pt left in sidelying with L UE supported.   Session 2 OT  treatment session focused on NMES and NMR using shoulder protocol. Pt completed stand-pivot transfer from bed >wc with min A stand and mod A to facilitate pivot. OT set-up 15 minute shoulder protocol implemented on level 17.Pt reported score of 0 pain at beginning of session, and 0 pain at the end of session. L UE PROM shoulder flex/ext/abd/add, elbow flex/ext, forearm pronation/supination, wrist flex/ext, finger flex/ext. Set pt up for Bioness, but unable to implement intervention within time frame of treatment session. Pt returned to room at end of session and left seated in wc with L 1/2 lap tray and needs met.   Therapy Documentation Precautions:  Precautions Precautions: Fall Precaution Comments: L hemiplegia Restrictions Weight Bearing Restrictions: No Pain: Pain Assessment Pain Assessment: No/denies pain ADL: ADL ADL Comments: Please see functional navigator  See Function Navigator for Current Functional Status.   Therapy/Group: Individual Therapy  Valma Cava 04/30/2017, 4:45 PM

## 2017-04-30 NOTE — Progress Notes (Signed)
Speech Language Pathology Daily Session Note  Patient Details  Name: Donna Rhodes MRN: [id number] Date of Birth: November 30, 1946  Today's Date: 04/30/2017 SLP Individual Time: 1500-1530 SLP Individual Time Calculation (min): 30 min  Short Term Goals: Week 1: SLP Short Term Goal 1 (Week 1): Patient will demonstrate efficient mastication and oral clearnace with trials of regular textures without overt s/s of aspiration with Mod I.  SLP Short Term Goal 2 (Week 1): Patient will demonstrate complex problem solving for functional tasks with Mod I.  SLP Short Term Goal 3 (Week 1): Patient will attend to left field of enviornment during functional tasks with Mod I.   Skilled Therapeutic Interventions:Pt was seen for skilled ST targeting cognitive goals.  During conversations with therapist, pt required min assist question cues to identify how her current physical and cognitive limitations will impact her functional independence in the home environment, although motivation to participate in therapy appeared to be contributing to necessary level of assist in addition to her primary awareness deficits.  Pt verbalizes depression regarding current loss of function and perceived lack of support at home.  Therapist encouraged pt to find at least one positive area of focus each day.  Pt requested to use the bathroom upon return to room and needed close supervision for safety when getting up from the commode as pt verbalized lack of awareness of how to use the call bell and stated that she could stand up and clean herself unassisted.  Pt was left in nursing care.  Continue per current plan of care.       Function:  Eating Eating                 Cognition Comprehension Comprehension assist level: Understands complex 90% of the time/cues 10% of the time  Expression   Expression assist level: Expresses basic needs/ideas: With no assist  Social Interaction Social Interaction assist level: Interacts  appropriately 75 - 89% of the time - Needs redirection for appropriate language or to initiate interaction.  Problem Solving Problem solving assist level: Solves basic 75 - 89% of the time/requires cueing 10 - 24% of the time  Memory Memory assist level: More than reasonable amount of time    Pain Pain Assessment Pain Assessment: No/denies pain  Therapy/Group: Individual Therapy  Donna Rhodes, Selinda Orion 04/30/2017, 4:35 PM

## 2017-04-30 NOTE — IPOC Note (Signed)
Overall Plan of Care [facility]) Patient Details Name: Donna Rhodes MRN: [id number] DOB: 1946/12/26  Admitting Diagnosis: R CVA  Hospital Problems: Principal Problem:   Right-sided lacunar infarction [facility]) Active Problems:   Essential hypertension   Hypercholesterolemia   GERD (gastroesophageal reflux disease)   Rheumatoid arthritis involving both hands (HCC)     Functional Problem List: Nursing Bowel, Endurance, Medication Management, Nutrition, Motor, Pain, Safety  PT Balance, Behavior, Endurance, Nutrition, Motor, Safety, Sensory, Skin Integrity, Perception  OT Balance, Endurance, Motor, Pain, Safety  SLP Cognition  TR         Basic ADL's: OT Eating, Grooming, Bathing, Dressing, Toileting     Advanced  ADL's: OT       Transfers: PT Bed Mobility, Bed to Chair, Car, Furniture, Floor  OT Tub/Shower, Agricultural engineer: PT Ambulation, Stairs, Emergency planning/management officer     Additional Impairments: OT Fuctional Use of Upper Extremity  SLP Swallowing, Social Cognition   Problem Solving  TR      Anticipated Outcomes Item Anticipated Outcome  Self Feeding Mod I  Swallowing  Mod I    Basic self-care  Mod I/Supervision  Toileting  Supervision   Bathroom Transfers Supervision  Bowel/Bladder  minimal assist  Transfers  Supervisoin assist with LRAD   Locomotion  Supervisoin assist wit hLRAD for household distances.   Communication     Cognition  Mod I   Pain  2 or less out of 10  Safety/Judgment  supervision   Therapy Plan: PT Intensity: Minimum of 1-2 x/day ,45 to 90 minutes PT Frequency: 5 out of 7 days PT Duration Estimated Length of Stay: 17-21 days  OT Intensity: Minimum of 1-2 x/day, 45 to 90 minutes OT Frequency: 5 out of 7 days OT Duration/Estimated Length of Stay: 3 weeks SLP Intensity: Minumum of 1-2 x/day, 30 to 90 minutes SLP Frequency: 3 to 5 out of 7 days SLP Duration/Estimated Length of Stay: 2-2.5 weeks but may be shorter for SLP     Team Interventions: Nursing Interventions Patient/Family Education, Bowel Management, Disease Management/Prevention, Dysphagia/Aspiration Precaution Training, Psychosocial Support, Pain Management, Medication Management  PT interventions Ambulation/gait training, Balance/vestibular training, Cognitive remediation/compensation, Community reintegration, Discharge planning, Disease management/prevention, DME/adaptive equipment instruction, Functional electrical stimulation, Functional mobility training, Neuromuscular re-education, Pain management, Patient/family education, Psychosocial support, Skin care/wound management, Splinting/orthotics, Stair training, Therapeutic Activities, Therapeutic Exercise, UE/LE Strength taining/ROM, Visual/perceptual remediation/compensation, Wheelchair propulsion/positioning  OT Interventions Training and development officer, Community reintegration, Discharge planning, DME/adaptive equipment instruction, Functional electrical stimulation, Neuromuscular re-education, Functional mobility training, Pain management, Patient/family education, Self Care/advanced ADL retraining, Therapeutic Activities, Therapeutic Exercise, UE/LE Coordination activities, UE/LE Strength taining/ROM  SLP Interventions Cognitive remediation/compensation, Cueing hierarchy, Internal/external aids, Environmental controls, Functional tasks, Patient/family education, Therapeutic Activities, Dysphagia/aspiration precaution training  TR Interventions    SW/CM Interventions Discharge Planning, Psychosocial Support, Patient/Family Education    Team Discharge Planning: Destination: PT-Home ,OT- Home , SLP-Home Projected Follow-up: PT-Home health PT, OT-  Home health OT, SLP-None Projected Equipment Needs: PT-Rolling walker with 5" wheels, Wheelchair (measurements), Wheelchair cushion (measurements), To be determined, OT- To be determined, Tub/shower bench, SLP-None recommended by SLP Equipment Details: PT- ,  OT-TBD pending pt progress Patient/family involved in discharge planning: PT- Patient, Family Midwife,  OT-Patient, Family member/caregiver, SLP-Patient, Family member/caregiver  MD ELOS: 18-21d Medical Rehab Prognosis:  Excellent Assessment:  71 y.o.LH femalewith history of HTN, GERD, RA, lung nodule who was [facility] with multiple falls and MRI brain without acute findings and cervical spondylosis  with central protrusion at C4/5. She was sent home and started developing left sided weakness withleft facial weakness and slurred speech. 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 MRA brain/neck was negative for occlusion or stenosis. 2D echo with EF 50-55% and grade 1 diastolic dysfunction. Stroke felt to be due to small vessel disease and Dr. Doy Mince recommended low dose ASA and plavix for secondary stroke prevention. She did have increase in weakness LUE on 4/30 and was treated with fluid bolus. She was started on dysphagia 3, thin liquids with aspiration precautions and educated on OME. Patient with resultant left sided weakness, right lean and decreased in ability to WB on LLE   Now requiring 24/7 Rehab RN,MD, as well as CIR level PT, OT and SLP.  Treatment team will focus on ADLs and mobility with goals set at Supervision See Team Conference Notes for weekly updates to the plan of care

## 2017-04-30 NOTE — Progress Notes (Signed)
Physical Therapy Session Note  Patient Details  Name: Donna Rhodes MRN: [id number] Date of Birth: 01-02-1946  Today's Date: 04/30/2017 PT Individual Time: 0900-1000 PT Individual Time Calculation (min): 60 min   Short Term Goals: Week 1:  PT Short Term Goal 1 (Week 1): Pt will perform bedmobility consistently with min assist  PT Short Term Goal 2 (Week 1): Pt will performed bed<>WC transfers with min assist and LRAD  PT Short Term Goal 3 (Week 1): Pt will ambulate 59f with mod assist  PT Short Term Goal 4 (Week 1): Pt will propell WC 1024fwith min assist  PT Short Term Goal 5 (Week 1): Pt will maintain standing balance with min-mod assist for 2 minutes    Skilled Therapeutic Interventions/Progress Updates:    no c/o pain but reports fatigue.  Session focus on LLE NMR, transfers, and ambulation.    Pt transitions sit<>stand x2 from w/c to attempt stand/pivot to mat, but pt unable to effectively bear weight through LLE to pivot to mat safely.  Max multimodal cues and mod assist for squat/pivot to mat on R.  Pt completes toe taps to 1" step from seated position focus on isolation of movement in LLE 2x8 reps.  Standing weight shift with max assist to facilitate LLE extension, progress to weight shift focus on knee flexion/extension with shift, progress to minisquats with mod/max assist and verbal cues for pacing.  Gait training 2x30' with hemiwalker and max assist to position/stabilize LLE.  Pt with occasional LLE tone with difficulty weight bearing and pt with tendency to try and pick up RLE during these episodes.  Educated on safety with RLE being on the ground to stabilize until LLE could be positioned for weight bearing.  Pt returned to room at end of session and positioned upright in w/c with half lap tray in place, call bell in reach and needs met.   Therapy Documentation Precautions:  Precautions Precautions: Fall Precaution Comments: L hemiplegia Restrictions Weight Bearing  Restrictions: No   See Function Navigator for Current Functional Status.   Therapy/Group: Individual Therapy  CaEarnest Conroyenven-Crew 04/30/2017, 9:51 AM

## 2017-04-30 NOTE — Progress Notes (Signed)
Subjective/Complaints: RN states pt is depressed, pt states she slept well Meal intake 25-100% FLuid 663m ROS-  + cough, non prod, no fever or chills, - N/V/D, CP or SOB  Objective: Vital Signs: Blood pressure 136/62, pulse 82, temperature 99 F (37.2 C), temperature source Oral, resp. rate 16, height '5\' 3"'  (1.6 m), weight 56.3 kg (124 lb 1.9 oz), last menstrual period 12/29/1981, SpO2 97 %. No results found. Results for orders placed or performed during the hospital encounter of 04/27/17 (from the past 72 hour(s))  CBC WITH DIFFERENTIAL     Status: None   Collection Time: 04/28/17  7:14 AM  Result Value Ref Range   WBC 6.6 4.0 - 10.5 K/uL   RBC 4.10 3.87 - 5.11 MIL/uL   Hemoglobin 12.5 12.0 - 15.0 g/dL   HCT 38.5 36.0 - 46.0 %   MCV 93.9 78.0 - 100.0 fL   MCH 30.5 26.0 - 34.0 pg   MCHC 32.5 30.0 - 36.0 g/dL   RDW 14.8 11.5 - 15.5 %   Platelets 242 150 - 400 K/uL   Neutrophils Relative % 66 %   Neutro Abs 4.4 1.7 - 7.7 K/uL   Lymphocytes Relative 21 %   Lymphs Abs 1.4 0.7 - 4.0 K/uL   Monocytes Relative 7 %   Monocytes Absolute 0.5 0.1 - 1.0 K/uL   Eosinophils Relative 5 %   Eosinophils Absolute 0.3 0.0 - 0.7 K/uL   Basophils Relative 1 %   Basophils Absolute 0.0 0.0 - 0.1 K/uL  Comprehensive metabolic panel     Status: Abnormal   Collection Time: 04/28/17  7:14 AM  Result Value Ref Range   Sodium 139 135 - 145 mmol/L   Potassium 3.6 3.5 - 5.1 mmol/L   Chloride 106 101 - 111 mmol/L   CO2 24 22 - 32 mmol/L   Glucose, Bld 97 65 - 99 mg/dL   BUN 14 6 - 20 mg/dL   Creatinine, Ser 0.76 0.44 - 1.00 mg/dL   Calcium 8.9 8.9 - 10.3 mg/dL   Total Protein 6.6 6.5 - 8.1 g/dL   Albumin 3.3 (L) 3.5 - 5.0 g/dL   AST 33 15 - 41 U/L   ALT 28 14 - 54 U/L   Alkaline Phosphatase 96 38 - 126 U/L   Total Bilirubin 0.7 0.3 - 1.2 mg/dL   GFR calc non Af Amer >60 >60 mL/min   GFR calc Af Amer >60 >60 mL/min    Comment: (NOTE) The eGFR has been calculated using the CKD EPI  equation. This calculation has not been validated in all clinical situations. eGFR's persistently <60 mL/min signify possible Chronic Kidney Disease.    Anion gap 9 5 - 15     HEENT: scalp abrasion left pariental with dermabond non tender  Cardio: RRR and no murmur or ES Resp: CTA B/L and unlabored GI: BS positive and NT, ND Extremity:  Pulses positive and No Edema Skin:   Other see above Neuro: Alert/Oriented, Normal Sensory, Abnormal Motor 0/5 in Left delt , bi, tri , grip, 3- L HF, KE, 0 ADF, Abnormal FMC Tone  indreasing Left finger flexor tone MAS 1 and Tone:  Hypertonia Musc/Skel:  Other Left shoulder subluxation , Lbilateral hand MCP adn PIP chronic swelling no acute synovitis Gen NAD   Assessment/Plan: 1. Functional deficits secondary to Left hemiparesis which require 3+ hours per day of interdisciplinary therapy in a comprehensive inpatient rehab setting. Physiatrist is providing close team supervision and 24 hour management  of active medical problems listed below. Physiatrist and rehab team continue to assess barriers to discharge/monitor patient progress toward functional and medical goals. FIM: Function - Bathing Position: Shower Body parts bathed by patient: Left arm, Chest, Abdomen, Front perineal area, Right upper leg, Left upper leg Body parts bathed by helper: Left lower leg, Right lower leg, Back, Buttocks Assist Level: Touching or steadying assistance(Pt > 75%)  Function- Upper Body Dressing/Undressing What is the patient wearing?: Pull over shirt/dress Pull over shirt/dress - Perfomed by patient: Thread/unthread right sleeve Pull over shirt/dress - Perfomed by helper: Thread/unthread left sleeve, Put head through opening, Pull shirt over trunk Assist Level: Touching or steadying assistance(Pt > 75%) Function - Lower Body Dressing/Undressing What is the patient wearing?: Non-skid slipper socks, Pants, Underwear Position: Wheelchair/chair at sink Underwear -  Performed by helper: Thread/unthread right underwear leg, Pull underwear up/down, Thread/unthread left underwear leg Pants- Performed by helper: Thread/unthread right pants leg, Thread/unthread left pants leg, Pull pants up/down Non-skid slipper socks- Performed by helper: Don/doff right sock, Don/doff left sock Assist for footwear: Dependant Assist for lower body dressing: Touching or steadying assistance (Pt > 75%)  Function - Toileting Toileting steps completed by patient: Performs perineal hygiene Toileting steps completed by helper: Adjust clothing prior to toileting, Adjust clothing after toileting Toileting Assistive Devices: Grab bar or rail Assist level: Touching or steadying assistance (Pt.75%)  Function - Air cabin crew transfer assistive device: Elevated toilet seat/BSC over toilet, Grab bar Assist level to toilet: Moderate assist (Pt 50 - 74%/lift or lower) Assist level from toilet: Moderate assist (Pt 50 - 74%/lift or lower) Assist level to bedside commode (at bedside): Total assist (Pt < 25%) Assist level from bedside commode (at bedside): Total assist (Pt < 25%)  Function - Chair/bed transfer Chair/bed transfer method: Squat pivot Chair/bed transfer assist level: Moderate assist (Pt 50 - 74%/lift or lower) Chair/bed transfer assistive device: Armrests Chair/bed transfer details: Verbal cues for precautions/safety, Verbal cues for safe use of DME/AE, Verbal cues for sequencing  Function - Locomotion: Wheelchair Will patient use wheelchair at discharge?: Yes Type: Manual Max wheelchair distance: 150 Assist Level: Touching or steadying assistance (Pt > 75%) Assist Level: Touching or steadying assistance (Pt > 75%) Assist Level: Touching or steadying assistance (Pt > 75%) Turns around,maneuvers to table,bed, and toilet,negotiates 3% grade,maneuvers on rugs and over doorsills: No Function - Locomotion: Ambulation Assistive device: Walker-rolling, Rail in  hallway Max distance: 20 Assist level: Maximal assist (Pt 25 - 49%) Assist level: Maximal assist (Pt 25 - 49%)  Function - Comprehension Comprehension: Auditory Comprehension assist level: Understands basic 90% of the time/cues < 10% of the time  Function - Expression Expression: Verbal Expression assist level: Expresses basic needs/ideas: With no assist  Function - Social Interaction Social Interaction assist level: Interacts appropriately 75 - 89% of the time - Needs redirection for appropriate language or to initiate interaction.  Function - Problem Solving Problem solving assist level: Solves basic 90% of the time/requires cueing < 10% of the time  Function - Memory Memory assist level: More than reasonable amount of time Patient normally able to recall (first 3 days only): Current season, Location of own room, That he or she is in a hospital   Medical Problem List and Plan: 1. 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 DVT Prophylaxis/Anticoagulation: Pharmaceutical: Lovenox 3. Pain Management: tylenol prn 4. Mood: Seems flat--question fatigue. Team to provide ego support. LCSW to follow for evaluation  and support.  -neuropsych for mood assessment, no clear cut vegetative signs may be adjustment d/o 5. Neuropsych: This patient iscapable of making decisions on herown behalf. 6. Skin/Wound Care: Monitor I/O 7. Fluids/Electrolytes/Nutrition: Monitor I/O. BMET normal, enc fluids  8. HTN: Montior BP bid. On Norvasc daily controlled Vitals:   04/29/17 1633 04/30/17 0555  BP: (!) 129/59 136/62  Pulse: 92 82  Resp: 18 16  Temp: 99.4 F (37.4 C) 99 F (37.2 C)   9. Dyslipidemia: Continue Lipitor.  10. Hypothyroid: On supplement 11. RA: Managed with methotrexate injections weekly.  12. Anorexia: Will liberalize diet to regular to help with food choices.  13. Persistent productive cough,  ?recent aspiration, right lower-mid rales on exam -IS -Negative CXR 14.  Hypoalb- mild start prostat LOS (Days) 3 A FACE TO FACE EVALUATION WAS PERFORMED  Nhu Glasby E 04/30/2017, 7:47 AM

## 2017-05-01 ENCOUNTER — Inpatient Hospital Stay (HOSPITAL_COMMUNITY): Payer: PPO | Admitting: Occupational Therapy

## 2017-05-01 ENCOUNTER — Inpatient Hospital Stay (HOSPITAL_COMMUNITY): Payer: PPO

## 2017-05-01 ENCOUNTER — Inpatient Hospital Stay (HOSPITAL_COMMUNITY): Payer: PPO | Admitting: Physical Therapy

## 2017-05-01 DIAGNOSIS — R05 Cough: Secondary | ICD-10-CM | POA: Insufficient documentation

## 2017-05-01 DIAGNOSIS — R509 Fever, unspecified: Secondary | ICD-10-CM

## 2017-05-01 DIAGNOSIS — I10 Essential (primary) hypertension: Secondary | ICD-10-CM

## 2017-05-01 DIAGNOSIS — G8320 Monoplegia of upper limb affecting unspecified side: Secondary | ICD-10-CM

## 2017-05-01 DIAGNOSIS — B370 Candidal stomatitis: Secondary | ICD-10-CM

## 2017-05-01 LAB — URINALYSIS, ROUTINE W REFLEX MICROSCOPIC
BACTERIA UA: NONE SEEN
BILIRUBIN URINE: NEGATIVE
Glucose, UA: NEGATIVE mg/dL
Hgb urine dipstick: NEGATIVE
KETONES UR: NEGATIVE mg/dL
LEUKOCYTES UA: NEGATIVE
Nitrite: NEGATIVE
PH: 6 (ref 5.0–8.0)
Protein, ur: 100 mg/dL — AB
Specific Gravity, Urine: 1.025 (ref 1.005–1.030)

## 2017-05-01 MED ORDER — FLUCONAZOLE 100 MG PO TABS
100.0000 mg | ORAL_TABLET | Freq: Every day | ORAL | Status: DC
  Administered 2017-05-02 – 2017-05-05 (×4): 100 mg via ORAL
  Filled 2017-05-01 (×4): qty 1

## 2017-05-01 MED ORDER — VANCOMYCIN HCL 500 MG IV SOLR
500.0000 mg | Freq: Two times a day (BID) | INTRAVENOUS | Status: AC
  Administered 2017-05-02 – 2017-05-03 (×4): 500 mg via INTRAVENOUS
  Filled 2017-05-01 (×4): qty 500

## 2017-05-01 MED ORDER — VANCOMYCIN HCL IN DEXTROSE 1-5 GM/200ML-% IV SOLN
1000.0000 mg | Freq: Once | INTRAVENOUS | Status: AC
  Administered 2017-05-01: 1000 mg via INTRAVENOUS
  Filled 2017-05-01: qty 200

## 2017-05-01 MED ORDER — FLUCONAZOLE 100 MG PO TABS
200.0000 mg | ORAL_TABLET | Freq: Once | ORAL | Status: AC
  Administered 2017-05-01: 200 mg via ORAL
  Filled 2017-05-01: qty 2

## 2017-05-01 MED ORDER — DEXTROSE 5 % IV SOLN
1.0000 g | Freq: Two times a day (BID) | INTRAVENOUS | Status: AC
  Administered 2017-05-01 – 2017-05-08 (×14): 1 g via INTRAVENOUS
  Filled 2017-05-01 (×17): qty 1

## 2017-05-01 NOTE — Progress Notes (Signed)
Physical Therapy Session Note  Patient Details  Name: Donna Rhodes MRN: [id number] Date of Birth: 1946/05/09  Today's Date: 05/01/2017 PT Individual Time: 4830-7354 PT Individual Time Calculation (min): 60 min   Short Term Goals: Week 1:  PT Short Term Goal 1 (Week 1): Pt will perform bedmobility consistently with min assist  PT Short Term Goal 2 (Week 1): Pt will performed bed<>WC transfers with min assist and LRAD  PT Short Term Goal 3 (Week 1): Pt will ambulate 88f with mod assist  PT Short Term Goal 4 (Week 1): Pt will propell WC 1058fwith min assist  PT Short Term Goal 5 (Week 1): Pt will maintain standing balance with min-mod assist for 2 minutes    Skilled Therapeutic Interventions/Progress Updates:    no c/o pain.  Session focus on activity tolerance and NMR via gait training and nustep.    Pt receiving medications when PT arrived, discussed CLOF and updated pt's daughter on PT progress while RN finished with meds.  Gait training 2x30' with rest break in between (pt c/o nausea), mod assist to advance/place LLE.  With practice, pt able to advance LLE and stabilize with mod tactile cues.  Nustep x8 minutes with BLEs and RUE focus on reciprocal stepping pattern retraining, attention to time, and maintaining neutral LLE positioning with min assist fade to supervision.  Pt returned to room after nustep due to hair dresser arriving and pt requesting to terminate session early.  Left in w/c with daughter present and needs met.   Therapy Documentation Precautions:  Precautions Precautions: Fall Precaution Comments: L hemiplegia Restrictions Weight Bearing Restrictions: No General: PT Amount of Missed Time (min): 15 Minutes PT Missed Treatment Reason: Other (Comment) (pt's hair dresser arrived for an appointment)   See Function Navigator for Current Functional Status.   Therapy/Group: Individual Therapy  Horald Birky E Penven-Crew 05/01/2017, 11:00 AM

## 2017-05-01 NOTE — Progress Notes (Signed)
Pharmacy Antibiotic Note Donna Rhodes is a 71 y.o. female admitted on 04/27/2017. Now with concern for PNA and to add Cefepime and vancomycin.  Plan: 1. Cefepime 1 gram IV every 12 hours  2. Vancomycin 1000 mg IV x 1 now followed by 500 mg IV every 12 hours starting on 5/6 3. If continued long enough will obtain vancomycin trough at SS; goal trough 15-20 4. BMP in am  Height: 5\' 3"  (160 cm) Weight: 123 lb 10.9 oz (56.1 kg) IBW/kg (Calculated) : 52.4  Temp (24hrs), Avg:100.5 F (38.1 C), Min:99 F (37.2 C), Max:103.1 F (39.5 C)   Recent Labs Lab 04/25/17 0443 04/28/17 0714  WBC 4.6 6.6  CREATININE 0.58 0.76    Estimated Creatinine Clearance: 53.4 mL/min (by C-G formula based on SCr of 0.76 mg/dL).    Allergies  Allergen Reactions  . Amoxicillin Other (See Comments)    Irritated tongue  . Avelox [Moxifloxacin Hcl In Nacl]   . Azithromycin Other (See Comments)    Caused mouth and tongue to be sore  . Codeine   . [place] Weight Management [Nutritional Supplements]     Mouth soreness  . Food     Walnuts --mouth breaks out  . Moxifloxacin Other (See Comments)  . Naproxen     Thank you for allowing pharmacy to be a part of this patient's care.  Vincenza Hews, PharmD, BCPS 05/01/2017, 8:33 PM

## 2017-05-01 NOTE — Progress Notes (Signed)
Occupational Therapy Session Note  Patient Details  Name: Donna Rhodes MRN: [id number] Date of Birth: 30-Nov-1946  Today's Date: 05/01/2017 OT Individual Time: 1400-1430 OT Individual Time Calculation (min): 30 min    Short Term Goals: Week 1:  OT Short Term Goal 1 (Week 1): Pt will complete LB dressing with MOD A OT Short Term Goal 2 (Week 1): Pt will complete UB dressing using hemi-techniques OT Short Term Goal 3 (Week 1): Pt will maintain dynamic standing balance with mod A x1 in preparation for ADLa OT Short Term Goal 4 (Week 1): Pt will demonstrate self-ROM on L UE with min cues  Skilled Therapeutic Interventions/Progress Updates: Patient running fever (103F). Still doc requested clean catch.    This clinician and nurse helped patient transfer out of bed and to toilet and backinto w/c in order to get clean catch.    Patient somewhat lethargic and very warm to the touch.    This session she required moderate asisstance overall for transfers partially due to c/o 'not feeling too well.'   XRay technician came into room to take patient for xray at the end of this session.     Oldest dtr present for this session     Therapy Documentation Precautions:  Precautions Precautions: Fall Precaution Comments: L hemiplegia Restrictions Weight Bearing Restrictions: No    Vital Signs:103F temp per nurse  Pain: Pain Assessment Pain Assessment: 0-10 Pain Score: 0-No pain  See Function Navigator for Current Functional Status.   Therapy/Group: Individual Therapy  Herschell Dimes 05/01/2017, 4:36 PM

## 2017-05-01 NOTE — Progress Notes (Signed)
Occupational Therapy Session Note  Patient Details  Name: Donna Rhodes MRN: [id number] Date of Birth: 03-25-1946  Today's Date: 05/01/2017 OT Individual Time: 9678-9381 OT Individual Time Calculation (min): 90 min    Short Term Goals: Week 1:  OT Short Term Goal 1 (Week 1): Pt will complete LB dressing with MOD A OT Short Term Goal 2 (Week 1): Pt will complete UB dressing using hemi-techniques OT Short Term Goal 3 (Week 1): Pt will maintain dynamic standing balance with mod A x1 in preparation for ADLa OT Short Term Goal 4 (Week 1): Pt will demonstrate self-ROM on L UE with min cues  Skilled Therapeutic Interventions/Progress Updates: nurse dtr present for session.   Patient participation as follows: UB Bathing in shower= Min A (help required to wash right arm; patient completed lateralleans to wash periarea - stated she was too fatigued to stand today in shower);   UB dressing=overall moderate assist for heimdressing;     LB bathing=moderate asisst for feet (later leans to wash periarea)  LB dressing=max assist due to time constraints patient had limited amt of time left in session to practice  Patient left seated in w/c in the supportive care of her nurse dtr at end of session     Therapy Documentation Precautions:  Precautions Precautions: Fall Precaution Comments: L hemiplegia Restrictions Weight Bearing Restrictions: No  Pain:denied    ADL ADL Comments: Please see functional navigator     See Function Navigator for Current Functional Status.   Therapy/Group: Individual Therapy  [name] [facility] 05/01/2017, 3:55 PM

## 2017-05-01 NOTE — Progress Notes (Signed)
Subjective/Complaints: Pt seen laying in bed this AM.  Daughter at bedside.  Pt upset she still has thrush.  Later informed by nursing of fever.    ROS-  +fever, thrush. Denies N/V/D, CP or SOB  Objective: Vital Signs: Blood pressure (!) 129/55, pulse 81, temperature (!) 101.2 F (38.4 C), temperature source Oral, resp. rate 18, height 5\' 3"  (1.6 m), weight 56.1 kg (123 lb 10.9 oz), last menstrual period 12/29/1981, SpO2 93 %. No results found. Results for orders placed or performed during the hospital encounter of 04/27/17 (from the past 72 hour(s))  Urinalysis, Routine w reflex microscopic     Status: Abnormal   Collection Time: 05/01/17  3:02 PM  Result Value Ref Range   Color, Urine AMBER (A) YELLOW    Comment: BIOCHEMICALS MAY BE AFFECTED BY COLOR   APPearance CLEAR CLEAR   Specific Gravity, Urine 1.025 1.005 - 1.030   pH 6.0 5.0 - 8.0   Glucose, UA NEGATIVE NEGATIVE mg/dL   Hgb urine dipstick NEGATIVE NEGATIVE   Bilirubin Urine NEGATIVE NEGATIVE   Ketones, ur NEGATIVE NEGATIVE mg/dL   Protein, ur 100 (A) NEGATIVE mg/dL   Nitrite NEGATIVE NEGATIVE   Leukocytes, UA NEGATIVE NEGATIVE   RBC / HPF 0-5 0 - 5 RBC/hpf   WBC, UA 0-5 0 - 5 WBC/hpf   Bacteria, UA NONE SEEN NONE SEEN   Squamous Epithelial / LPF 0-5 (A) NONE SEEN   Mucous PRESENT      HEENT: scalp abrasion left pariental with dermabond non tender.   Cardio: RRR and no JVD Resp: upper airway sounds and unlabored GI: BS positive and ND Skin:   Other see above. Warm and dary Neuro:  Alert Motor: LUE 0/5  LLE: HF, KE 3-/5, ADF/PF 0/5 Musc/Skel:  Other Left shoulder subluxation , bilateral hand MCP adn PIP chronic swelling no acute synovitis Gen NAD. Well-developed.   Assessment/Plan: 1. Functional deficits secondary to Left hemiparesis which require 3+ hours per day of interdisciplinary therapy in a comprehensive inpatient rehab setting. Physiatrist is providing close team supervision and 24 hour management of  active medical problems listed below. Physiatrist and rehab team continue to assess barriers to discharge/monitor patient progress toward functional and medical goals. FIM: Function - Bathing Position: Shower Body parts bathed by patient: Left arm, Chest, Abdomen, Front perineal area, Right upper leg, Left upper leg Body parts bathed by helper: Left lower leg, Right lower leg, Back, Buttocks, Right arm Assist Level: Touching or steadying assistance(Pt > 75%)  Function- Upper Body Dressing/Undressing What is the patient wearing?: Pull over shirt/dress Pull over shirt/dress - Perfomed by patient: Thread/unthread right sleeve, Put head through opening Pull over shirt/dress - Perfomed by helper: Pull shirt over trunk, Thread/unthread left sleeve Assist Level: Touching or steadying assistance(Pt > 75%) Function - Lower Body Dressing/Undressing What is the patient wearing?: Non-skid slipper socks, Pants, Underwear Position: Wheelchair/chair at sink Underwear - Performed by helper: Thread/unthread right underwear leg, Pull underwear up/down, Thread/unthread left underwear leg Pants- Performed by helper: Thread/unthread right pants leg, Thread/unthread left pants leg, Pull pants up/down Non-skid slipper socks- Performed by helper: Don/doff right sock, Don/doff left sock Assist for footwear: Dependant Assist for lower body dressing: Touching or steadying assistance (Pt > 75%)  Function - Toileting Toileting steps completed by patient: Performs perineal hygiene Toileting steps completed by helper: Adjust clothing prior to toileting, Adjust clothing after toileting Toileting Assistive Devices: Grab bar or rail Assist level: Touching or steadying assistance (Pt.75%)  Function - Toilet  Transfers Toilet transfer assistive device: Elevated toilet seat/BSC over toilet, Grab bar Assist level to toilet: Moderate assist (Pt 50 - 74%/lift or lower) Assist level from toilet: Moderate assist (Pt 50 -  74%/lift or lower) Assist level to bedside commode (at bedside): Total assist (Pt < 25%) Assist level from bedside commode (at bedside): Total assist (Pt < 25%)  Function - Chair/bed transfer Chair/bed transfer method: Squat pivot Chair/bed transfer assist level: Touching or steadying assistance (Pt > 75%) Chair/bed transfer assistive device: Armrests Chair/bed transfer details: Tactile cues for weight shifting  Function - Locomotion: Wheelchair Will patient use wheelchair at discharge?: Yes Type: Manual Max wheelchair distance: 150 Assist Level: Touching or steadying assistance (Pt > 75%) Assist Level: Touching or steadying assistance (Pt > 75%) Assist Level: Touching or steadying assistance (Pt > 75%) Turns around,maneuvers to table,bed, and toilet,negotiates 3% grade,maneuvers on rugs and over doorsills: No Function - Locomotion: Ambulation Assistive device: Walker-hemi Max distance: 30 Assist level: Moderate assist (Pt 50 - 74%) Assist level: Moderate assist (Pt 50 - 74%) Walk 50 feet with 2 turns activity did not occur: Safety/medical concerns Walk 150 feet activity did not occur: Safety/medical concerns Walk 10 feet on uneven surfaces activity did not occur: Safety/medical concerns  Function - Comprehension Comprehension: Auditory Comprehension assist level: Understands complex 90% of the time/cues 10% of the time  Function - Expression Expression: Verbal Expression assist level: Expresses basic needs/ideas: With no assist  Function - Social Interaction Social Interaction assist level: Interacts appropriately 75 - 89% of the time - Needs redirection for appropriate language or to initiate interaction.  Function - Problem Solving Problem solving assist level: Solves basic 75 - 89% of the time/requires cueing 10 - 24% of the time  Function - Memory Memory assist level: More than reasonable amount of time Patient normally able to recall (first 3 days only): Current  season, Location of own room, That he or she is in a hospital   Medical Problem List and Plan: 1. 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 DVT Prophylaxis/Anticoagulation: Pharmaceutical: Lovenox 3. Pain Management: tylenol prn 4. Mood: Seems flat--question fatigue. Team to provide ego support. LCSW to follow for evaluation and support.  -neuropsych for mood assessment 5. Neuropsych: This patient iscapable of making decisions on herown behalf. 6. Skin/Wound Care: Monitor I/O 7. Fluids/Electrolytes/Nutrition: Monitor I/O. BMET normal, enc fluids  8. HTN: Montior BP bid. On Norvasc daily   Controlled 5/5 Vitals:   05/01/17 0957 05/01/17 1322  BP: (!) 129/55   Pulse:    Resp:    Temp:  (!) 101.2 F (38.4 C)   9. Dyslipidemia: Continue Lipitor.  10. Hypothyroid: On supplement 11. RA: Managed with methotrexate injections weekly.  12. Anorexia: Will liberalize diet to regular to help with food choices.  13. Persistent productive cough, ?recent aspiration, right lower-mid rales on exam -IS -Negative CXR 14.  Hypoalb- mild start prostat 15. Oral Thrush  Nystatin d/ced, diflucan ordered 5/5 16. Pyrexia  UA/Ucx ordered  CXR ordered  LOS (Days) 4 A FACE TO FACE EVALUATION WAS PERFORMED  Tomorrow Dehaas Lorie Phenix 05/01/2017, 3:49 PM

## 2017-05-02 DIAGNOSIS — E876 Hypokalemia: Secondary | ICD-10-CM

## 2017-05-02 DIAGNOSIS — J189 Pneumonia, unspecified organism: Secondary | ICD-10-CM

## 2017-05-02 LAB — URINE CULTURE

## 2017-05-02 LAB — BASIC METABOLIC PANEL
Anion gap: 12 (ref 5–15)
BUN: 13 mg/dL (ref 6–20)
CALCIUM: 8.5 mg/dL — AB (ref 8.9–10.3)
CO2: 24 mmol/L (ref 22–32)
CREATININE: 0.76 mg/dL (ref 0.44–1.00)
Chloride: 100 mmol/L — ABNORMAL LOW (ref 101–111)
Glucose, Bld: 106 mg/dL — ABNORMAL HIGH (ref 65–99)
Potassium: 3.4 mmol/L — ABNORMAL LOW (ref 3.5–5.1)
SODIUM: 136 mmol/L (ref 135–145)

## 2017-05-02 MED ORDER — POTASSIUM CHLORIDE CRYS ER 20 MEQ PO TBCR
20.0000 meq | EXTENDED_RELEASE_TABLET | Freq: Two times a day (BID) | ORAL | Status: AC
Start: 2017-05-02 — End: 2017-05-02
  Administered 2017-05-02 (×2): 20 meq via ORAL
  Filled 2017-05-02 (×2): qty 1

## 2017-05-02 NOTE — Progress Notes (Addendum)
Subjective/Complaints: Pt seen laying in bed this AM.  She didn't sleep well overnight because she could not get comfortable.  Also noted to have fevers with +CXR yesterday.   ROS-  Denies N/V/D, CP or SOB  Objective: Vital Signs: Blood pressure (!) 129/59, pulse (!) 55, temperature 99.5 F (37.5 C), temperature source Oral, resp. rate 18, height 5' 3" (1.6 m), weight 56.4 kg (124 lb 5.4 oz), last menstrual period 12/29/1981, SpO2 98 %. Dg Chest 2 View  Result Date: 05/01/2017 CLINICAL DATA:  Cough with fever. EXAM: CHEST  2 VIEW COMPARISON:  04/27/2017 chest radiograph. FINDINGS: The cardiomediastinal silhouette is unremarkable. Right lower lobe airspace disease is noted compatible with pneumonia. The left lung is clear. There is no evidence of pleural effusion, pneumothorax or acute bony abnormality. IMPRESSION: Right lower lobe airspace disease compatible with pneumonia. Radiographic follow-up to resolution is recommended. Electronically Signed   By: Margarette Canada M.D.   On: 05/01/2017 17:05   Results for orders placed or performed during the hospital encounter of 04/27/17 (from the past 72 hour(s))  Urinalysis, Routine w reflex microscopic     Status: Abnormal   Collection Time: 05/01/17  3:02 PM  Result Value Ref Range   Color, Urine AMBER (A) YELLOW    Comment: BIOCHEMICALS MAY BE AFFECTED BY COLOR   APPearance CLEAR CLEAR   Specific Gravity, Urine 1.025 1.005 - 1.030   pH 6.0 5.0 - 8.0   Glucose, UA NEGATIVE NEGATIVE mg/dL   Hgb urine dipstick NEGATIVE NEGATIVE   Bilirubin Urine NEGATIVE NEGATIVE   Ketones, ur NEGATIVE NEGATIVE mg/dL   Protein, ur 100 (A) NEGATIVE mg/dL   Nitrite NEGATIVE NEGATIVE   Leukocytes, UA NEGATIVE NEGATIVE   RBC / HPF 0-5 0 - 5 RBC/hpf   WBC, UA 0-5 0 - 5 WBC/hpf   Bacteria, UA NONE SEEN NONE SEEN   Squamous Epithelial / LPF 0-5 (A) NONE SEEN   Mucous PRESENT   Basic metabolic panel     Status: Abnormal   Collection Time: 05/02/17  5:11 AM  Result  Value Ref Range   Sodium 136 135 - 145 mmol/L   Potassium 3.4 (L) 3.5 - 5.1 mmol/L   Chloride 100 (L) 101 - 111 mmol/L   CO2 24 22 - 32 mmol/L   Glucose, Bld 106 (H) 65 - 99 mg/dL   BUN 13 6 - 20 mg/dL   Creatinine, Ser 0.76 0.44 - 1.00 mg/dL   Calcium 8.5 (L) 8.9 - 10.3 mg/dL   GFR calc non Af Amer >60 >60 mL/min   GFR calc Af Amer >60 >60 mL/min    Comment: (NOTE) The eGFR has been calculated using the CKD EPI equation. This calculation has not been validated in all clinical situations. eGFR's persistently <60 mL/min signify possible Chronic Kidney Disease.    Anion gap 12 5 - 15     HEENT: scalp abrasion left pariental with dermabond non tender.   Cardio: RRR and no JVD Resp: upper airway sounds and unlabored GI: BS positive and ND Skin:   Other see above. Warm and dary Neuro:  Alert Motor: LUE 0/5 (unchanged) LLE: HF, KE 3-/5, ADF/PF 0/5 Musc/Skel:  Other Left shoulder subluxation , bilateral hand MCP adn PIP chronic swelling no acute synovitis Gen NAD. Well-developed.   Assessment/Plan: 1. Functional deficits secondary to Left hemiparesis which require 3+ hours per day of interdisciplinary therapy in a comprehensive inpatient rehab setting. Physiatrist is providing close team supervision and 24 hour management of  active medical problems listed below. Physiatrist and rehab team continue to assess barriers to discharge/monitor patient progress toward functional and medical goals. FIM: Function - Bathing Position: Shower (sitting on tub transfer bench) Body parts bathed by patient: Left arm, Chest, Abdomen, Front perineal area, Right upper leg, Left upper leg Body parts bathed by helper: Chest, Abdomen, Front perineal area, Buttocks, Right upper leg, Left upper leg, Left arm Bathing not applicable: Left lower leg, Back, Right lower leg, Right arm Assist Level: Touching or steadying assistance(Pt > 75%)  Function- Upper Body Dressing/Undressing What is the patient  wearing?: Pull over shirt/dress Pull over shirt/dress - Perfomed by patient: Thread/unthread right sleeve, Thread/unthread left sleeve Pull over shirt/dress - Perfomed by helper: Put head through opening, Pull shirt over trunk Assist Level: Touching or steadying assistance(Pt > 75%) Function - Lower Body Dressing/Undressing What is the patient wearing?: Underwear, Pants, Non-skid slipper socks Position: Wheelchair/chair at sink Underwear - Performed by patient: Thread/unthread right underwear leg Underwear - Performed by helper: Thread/unthread left underwear leg, Pull underwear up/down Pants- Performed by patient: Thread/unthread right pants leg Pants- Performed by helper: Thread/unthread left pants leg, Pull pants up/down, Fasten/unfasten pants Non-skid slipper socks- Performed by helper: Don/doff right sock, Don/doff left sock Assist for footwear: Maximal assist Assist for lower body dressing: Touching or steadying assistance (Pt > 75%)  Function - Toileting Toileting steps completed by patient: Performs perineal hygiene Toileting steps completed by helper: Adjust clothing prior to toileting, Performs perineal hygiene, Adjust clothing after toileting Toileting Assistive Devices: Grab bar or rail Assist level: Touching or steadying assistance (Pt.75%)  Function - Air cabin crew transfer assistive device: Elevated toilet seat/BSC over toilet, Grab bar Assist level to toilet: Moderate assist (Pt 50 - 74%/lift or lower) Assist level from toilet: Moderate assist (Pt 50 - 74%/lift or lower) Assist level to bedside commode (at bedside): Total assist (Pt < 25%) Assist level from bedside commode (at bedside): Total assist (Pt < 25%)  Function - Chair/bed transfer Chair/bed transfer method: Squat pivot Chair/bed transfer assist level: Touching or steadying assistance (Pt > 75%) Chair/bed transfer assistive device: Armrests Chair/bed transfer details: Tactile cues for weight  shifting  Function - Locomotion: Wheelchair Will patient use wheelchair at discharge?: Yes Type: Manual Max wheelchair distance: 150 Assist Level: Touching or steadying assistance (Pt > 75%) Assist Level: Touching or steadying assistance (Pt > 75%) Assist Level: Touching or steadying assistance (Pt > 75%) Turns around,maneuvers to table,bed, and toilet,negotiates 3% grade,maneuvers on rugs and over doorsills: No Function - Locomotion: Ambulation Assistive device: Walker-hemi Max distance: 30 Assist level: Moderate assist (Pt 50 - 74%) Assist level: Moderate assist (Pt 50 - 74%) Walk 50 feet with 2 turns activity did not occur: Safety/medical concerns Walk 150 feet activity did not occur: Safety/medical concerns Walk 10 feet on uneven surfaces activity did not occur: Safety/medical concerns  Function - Comprehension Comprehension: Auditory Comprehension assist level: Follows complex conversation/direction with extra time/assistive device  Function - Expression Expression: Verbal Expression assist level: Expresses complex ideas: With extra time/assistive device  Function - Social Interaction Social Interaction assist level: Interacts appropriately 90% of the time - Needs monitoring or encouragement for participation or interaction.  Function - Problem Solving Problem solving assist level: Solves basic 75 - 89% of the time/requires cueing 10 - 24% of the time  Function - Memory Memory assist level: Recognizes or recalls 90% of the time/requires cueing < 10% of the time Patient normally able to recall (first 3 days only): Current  season, Location of own room, That he or she is in a hospital   Medical Problem List and Plan: 1. 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 DVT Prophylaxis/Anticoagulation: Pharmaceutical: Lovenox 3. Pain Management: tylenol prn 4. Mood: Seems flat--question fatigue. Team to  provide ego support. LCSW to follow for evaluation and support.  -neuropsych for mood assessment 5. Neuropsych: This patient iscapable of making decisions on herown behalf. 6. Skin/Wound Care: Monitor I/O 7. Fluids/Electrolytes/Nutrition: Monitor I/O. BMET normal, enc fluids  8. HTN: Montior BP bid. On Norvasc daily   Controlled 5/6 Vitals:   05/01/17 2337 05/02/17 0543  BP: (!) 122/58 (!) 129/59  Pulse: 84 (!) 55  Resp:  18  Temp:  99.5 F (37.5 C)   9. Dyslipidemia: Continue Lipitor.  10. Hypothyroid: On supplement 11. RA: Managed with methotrexate injections weekly.  12. Anorexia: Will liberalize diet to regular to help with food choices.  13. Persistent productive cough   IS  Negative CXR  See below 14.  Hypoalb- mild start prostat 15. Oral Thrush  Nystatin d/ced, diflucan ordered 5/5 16. Pyrexia  UA unremarkable, Ucx pending   CXR reviewed, showing PNA 17. PNA  Vanc/Cefepime started 5/5 18. Hypokalemia  K+ 3.4 on 5/6  Supplemented x1 day  Cont to monitor  LOS (Days) 5 A FACE TO FACE EVALUATION WAS PERFORMED  Dennard Vezina Lorie Phenix 05/02/2017, 9:53 AM

## 2017-05-02 NOTE — Progress Notes (Signed)
At 2030 paged Dr. Posey Pronto R/T CXR results, orders received. Spoke with daughter, Manuela Schwartz at 14 R/T starting IV antibiotics. +/- sleep during night. PRN tylenol and trazodone given at HS per patient's request. At 1478 complained of nausea, PRN compazine 5mg  given and effective. Patrici Ranks A

## 2017-05-02 NOTE — Plan of Care (Signed)
Problem: RH BOWEL ELIMINATION Goal: RH STG MANAGE BOWEL W/MEDICATION W/ASSISTANCE STG Manage Bowel with Medication with [place].   Outcome: Not Progressing No BM > 3 days.

## 2017-05-02 NOTE — Progress Notes (Signed)
Orthopedic Tech Progress Note Patient Details:  QUINCI GAVIDIA [birth year] [id number]  Patient ID: [name], female   DOB: 11-Oct-1946, 71 y.o.   MRN: [id number]   [name] 05/02/2017, 8:04 AMCalled [facility] for left Prafo boot, resting hand splint and cock up wrist splint.

## 2017-05-03 ENCOUNTER — Inpatient Hospital Stay (HOSPITAL_COMMUNITY): Payer: PPO

## 2017-05-03 ENCOUNTER — Encounter (HOSPITAL_COMMUNITY): Payer: PPO | Admitting: Psychology

## 2017-05-03 ENCOUNTER — Inpatient Hospital Stay (HOSPITAL_COMMUNITY): Payer: PPO | Admitting: Speech Pathology

## 2017-05-03 DIAGNOSIS — F4323 Adjustment disorder with mixed anxiety and depressed mood: Secondary | ICD-10-CM

## 2017-05-03 LAB — VANCOMYCIN, TROUGH: Vancomycin Tr: 9 ug/mL — ABNORMAL LOW (ref 15–20)

## 2017-05-03 MED ORDER — VANCOMYCIN HCL IN DEXTROSE 750-5 MG/150ML-% IV SOLN
750.0000 mg | Freq: Three times a day (TID) | INTRAVENOUS | Status: DC
Start: 2017-05-04 — End: 2017-05-06
  Administered 2017-05-04 – 2017-05-06 (×8): 750 mg via INTRAVENOUS
  Filled 2017-05-03 (×11): qty 150

## 2017-05-03 NOTE — Progress Notes (Signed)
Occupational Therapy Session Note  Patient Details  Name: DAKIYAH HEINKE MRN: [id number] Date of Birth: Jan 06, 1946  Today's Date: 05/03/2017 OT Individual Time: 1000-1100 OT Individual Time Calculation (min): 60 min    Short Term Goals: Week 1:  OT Short Term Goal 1 (Week 1): Pt will complete LB dressing with MOD A OT Short Term Goal 2 (Week 1): Pt will complete UB dressing using hemi-techniques OT Short Term Goal 3 (Week 1): Pt will maintain dynamic standing balance with mod A x1 in preparation for ADLa OT Short Term Goal 4 (Week 1): Pt will demonstrate self-ROM on L UE with min cues  Skilled Therapeutic Interventions/Progress Updates:    Pt resting in bed upon arrival with daughter present.  Pt declined bathing/dressing this morning.  Pt transitioned to gym and engaged in LUE NMR including weight bearing and AAROM including shoulder flexion/extension with LUE supported to eliminate gravity.  Pt able to balance stool on 2 legs and push/pull stool with good isolation.  Pt performed sit<>supine with min A.  Pt performed squat pivot transfer to w/c with min A.  Pt remained in w/c with daughter and signed off unit to go outside.    Therapy Documentation Precautions:  Precautions Precautions: Fall Precaution Comments: L hemiplegia Restrictions Weight Bearing Restrictions: No   Pain: Pt denied pain  See Function Navigator for Current Functional Status.   Therapy/Group: Individual Therapy  Leroy Libman 05/03/2017, 12:03 PM

## 2017-05-03 NOTE — Progress Notes (Signed)
Subjective/Complaints: Tries to cough up sputum but unable, discussed IS with pt and daughter  Discussed adjustment d/o with neuropsych  ROS-  +fever, thrush. Denies N/V/D, CP or SOB  Objective: Vital Signs: Blood pressure 128/66, pulse 89, temperature 98.9 F (37.2 C), temperature source Oral, resp. rate 17, height _0  (1.6 m), weight 56.2 kg (123 lb 14.4 oz), last menstrual period 12/29/1981, SpO2 97 %. Dg Chest 2 View  Result Date: 05/01/2017 CLINICAL DATA:  Cough with fever. EXAM: CHEST  2 VIEW COMPARISON:  04/27/2017 chest radiograph. FINDINGS: The cardiomediastinal silhouette is unremarkable. Right lower lobe airspace disease is noted compatible with pneumonia. The left lung is clear. There is no evidence of pleural effusion, pneumothorax or acute bony abnormality. IMPRESSION: Right lower lobe airspace disease compatible with pneumonia. Radiographic follow-up to resolution is recommended. Electronically Signed   By: Margarette Canada M.D.   On: 05/01/2017 17:05   Results for orders placed or performed during the hospital encounter of 04/27/17 (from the past 72 hour(s))  Culture, Urine     Status: Abnormal   Collection Time: 05/01/17  3:02 PM  Result Value Ref Range   Specimen Description URINE, RANDOM    Special Requests nystatin    Culture <10,000 COLONIES/mL INSIGNIFICANT GROWTH (A)    Report Status 05/02/2017 FINAL   Urinalysis, Routine w reflex microscopic     Status: Abnormal   Collection Time: 05/01/17  3:02 PM  Result Value Ref Range   Color, Urine AMBER (A) YELLOW    Comment: BIOCHEMICALS MAY BE AFFECTED BY COLOR   APPearance CLEAR CLEAR   Specific Gravity, Urine 1.025 1.005 - 1.030   pH 6.0 5.0 - 8.0   Glucose, UA NEGATIVE NEGATIVE mg/dL   Hgb urine dipstick NEGATIVE NEGATIVE   Bilirubin Urine NEGATIVE NEGATIVE   Ketones, ur NEGATIVE NEGATIVE mg/dL   Protein, ur 100 (A) NEGATIVE mg/dL   Nitrite NEGATIVE NEGATIVE   Leukocytes, UA NEGATIVE NEGATIVE   RBC / HPF 0-5 0 -  5 RBC/hpf   WBC, UA 0-5 0 - 5 WBC/hpf   Bacteria, UA NONE SEEN NONE SEEN   Squamous Epithelial / LPF 0-5 (A) NONE SEEN   Mucous PRESENT   Basic metabolic panel     Status: Abnormal   Collection Time: 05/02/17  5:11 AM  Result Value Ref Range   Sodium 136 135 - 145 mmol/L   Potassium 3.4 (L) 3.5 - 5.1 mmol/L   Chloride 100 (L) 101 - 111 mmol/L   CO2 24 22 - 32 mmol/L   Glucose, Bld 106 (H) 65 - 99 mg/dL   BUN 13 6 - 20 mg/dL   Creatinine, Ser 0.76 0.44 - 1.00 mg/dL   Calcium 8.5 (L) 8.9 - 10.3 mg/dL   GFR calc non Af Amer >60 >60 mL/min   GFR calc Af Amer >60 >60 mL/min    Comment: (NOTE) The eGFR has been calculated using the CKD EPI equation. This calculation has not been validated in all clinical situations. eGFR's persistently <60 mL/min signify possible Chronic Kidney Disease.    Anion gap 12 5 - 15     HEENT: scalp abrasion left pariental with dermabond non tender.   Cardio: RRR and no JVD Resp: upper airway sounds and unlabored GI: BS positive and ND Skin:   Other see above. Warm and dary Neuro:  Alert Motor: LUE 0/5  LLE: HF, KE 3-/5, ADF/PF 0/5 Musc/Skel:  Other Left shoulder subluxation , bilateral hand MCP adn PIP chronic swelling no  acute synovitis Gen NAD. Well-developed.   Assessment/Plan: 1. Functional deficits secondary to Left hemiparesis which require 3+ hours per day of interdisciplinary therapy in a comprehensive inpatient rehab setting. Physiatrist is providing close team supervision and 24 hour management of active medical problems listed below. Physiatrist and rehab team continue to assess barriers to discharge/monitor patient progress toward functional and medical goals. FIM: Function - Bathing Position: Shower (sitting on tub transfer bench) Body parts bathed by patient: Left arm, Chest, Abdomen, Front perineal area, Right upper leg, Left upper leg Body parts bathed by helper: Chest, Abdomen, Front perineal area, Buttocks, Right upper leg, Left  upper leg, Left arm Bathing not applicable: Left lower leg, Back, Right lower leg, Right arm Assist Level: Touching or steadying assistance(Pt > 75%)  Function- Upper Body Dressing/Undressing What is the patient wearing?: Pull over shirt/dress Pull over shirt/dress - Perfomed by patient: Thread/unthread right sleeve, Thread/unthread left sleeve Pull over shirt/dress - Perfomed by helper: Put head through opening, Pull shirt over trunk Assist Level: Touching or steadying assistance(Pt > 75%) Function - Lower Body Dressing/Undressing What is the patient wearing?: Underwear, Pants, Non-skid slipper socks Position: Wheelchair/chair at sink Underwear - Performed by patient: Thread/unthread right underwear leg Underwear - Performed by helper: Thread/unthread left underwear leg, Pull underwear up/down Pants- Performed by patient: Thread/unthread right pants leg Pants- Performed by helper: Thread/unthread left pants leg, Pull pants up/down, Fasten/unfasten pants Non-skid slipper socks- Performed by helper: Don/doff right sock, Don/doff left sock Assist for footwear: Maximal assist Assist for lower body dressing: Touching or steadying assistance (Pt > 75%)  Function - Toileting Toileting steps completed by patient: Performs perineal hygiene Toileting steps completed by helper: Adjust clothing prior to toileting, Adjust clothing after toileting Toileting Assistive Devices: Other (comment) (BSC) Assist level: Two helpers  Function - Air cabin crew transfer assistive device: Bedside commode Assist level to toilet: Moderate assist (Pt 50 - 74%/lift or lower) Assist level from toilet: Moderate assist (Pt 50 - 74%/lift or lower) Assist level to bedside commode (at bedside): 2 helpers Assist level from bedside commode (at bedside): 2 helpers  Function - Chair/bed transfer Chair/bed transfer method: Squat pivot Chair/bed transfer assist level: Touching or steadying assistance (Pt >  75%) Chair/bed transfer assistive device: Armrests Chair/bed transfer details: Tactile cues for weight shifting  Function - Locomotion: Wheelchair Will patient use wheelchair at discharge?: Yes Type: Manual Max wheelchair distance: 150 Assist Level: Touching or steadying assistance (Pt > 75%) Assist Level: Touching or steadying assistance (Pt > 75%) Assist Level: Touching or steadying assistance (Pt > 75%) Turns around,maneuvers to table,bed, and toilet,negotiates 3% grade,maneuvers on rugs and over doorsills: No Function - Locomotion: Ambulation Assistive device: Walker-hemi Max distance: 30 Assist level: Moderate assist (Pt 50 - 74%) Assist level: Moderate assist (Pt 50 - 74%) Walk 50 feet with 2 turns activity did not occur: Safety/medical concerns Walk 150 feet activity did not occur: Safety/medical concerns Walk 10 feet on uneven surfaces activity did not occur: Safety/medical concerns  Function - Comprehension Comprehension: Auditory Comprehension assist level: Understands complex 90% of the time/cues 10% of the time  Function - Expression Expression: Verbal Expression assist level: Expresses basic needs/ideas: With no assist  Function - Social Interaction Social Interaction assist level: Interacts appropriately 75 - 89% of the time - Needs redirection for appropriate language or to initiate interaction.  Function - Problem Solving Problem solving assist level: Solves basic 75 - 89% of the time/requires cueing 10 - 24% of the time  Function -  Memory Memory assist level: Recognizes or recalls 90% of the time/requires cueing < 10% of the time Patient normally able to recall (first 3 days only): Current season, Location of own room, That he or she is in a hospital   Medical Problem List and Plan: 1. 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 DVT  Prophylaxis/Anticoagulation: Pharmaceutical: Lovenox 3. Pain Management: tylenol prn 4. Mood: Seems flat--question fatigue. Team to provide ego support. LCSW to follow for evaluation and support.  -neuropsych for mood assessment 5. Neuropsych: This patient iscapable of making decisions on herown behalf. 6. Skin/Wound Care: Monitor I/O 7. Fluids/Electrolytes/Nutrition: Monitor I/O. BMET normal, enc fluids  8. HTN: Montior BP bid. On Norvasc daily   Controlled 5/5 Vitals:   05/02/17 1500 05/03/17 0502  BP: 134/64 128/66  Pulse: 100 89  Resp: 17 17  Temp: 99.1 F (37.3 C) 98.9 F (37.2 C)   9. Dyslipidemia: Continue Lipitor.  10. Hypothyroid: On supplement 11. RA: Managed with methotrexate injections weekly.  12. Anorexia: Will liberalize diet to regular to help with food choices.  13. Persistent productive cough, ?recent aspiration, right lower-mid rales on exam -IS -Negative CXR 14.  Hypoalb- mild start prostat 15. Oral Thrush  Nystatin d/ced, diflucan ordered 5/5 16. Pyrexia improved  UA neg  CXR RLL PNA on Vanc, multiple abx allergies  LOS (Days) 6 A FACE TO FACE EVALUATION WAS PERFORMED  Mariany Mackintosh E 05/03/2017, 8:16 AM

## 2017-05-03 NOTE — Evaluation (Signed)
Recreational Therapy Assessment and Plan  Patient Details  Name: Donna Rhodes MRN: [id number] Date of Birth: [birth year] Today's Date: 05/03/2017  Rehab Potential: Good ELOS: 3 weeks   Assessment  Problem List:      Patient Active Problem List   Diagnosis Date Noted  . 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[place]) 04/27/2017  . CVA (cerebral vascular accident) ([place]) 04/24/2017  . LGSIL Pap smear of vagina 12/15/2016  . Anal skin tag 02/02/2016  . Inflammatory arthritis 01/19/2016  . Right knee pain 01/19/2016  . Genital herpes 10/20/2015  . GERD (gastroesophageal reflux disease) 10/20/2015  . Status post vaginal hysterectomy 10/20/2015  . Health care maintenance 05/30/2015  . LLQ pain 03/31/2014  . Menopausal symptoms 03/31/2014  . Hypercholesterolemia 12/31/2012  . Hypothyroidism 12/29/2012  . Osteoarthritis 12/29/2012  . PULMONARY NODULE 01/02/2008  . Depression 12/30/2007  . Essential hypertension 12/30/2007    Past Medical History:      Past Medical History:  Diagnosis Date  . Atrophic vaginitis   . Dysplasia of vagina    vin 1 - vulva and vaginal cuff  . GERD (gastroesophageal reflux disease)   . HCV infection   . HSV (herpes simplex virus) infection   . Hypercholesterolemia   . Hypertension   . Hypothyroidism   . Lung nodule    stable  . Menopausal state   . Osteoarthritis    hand involvement, cervical disc disease  . Osteoporosis   . Pelvic pain in female   . Rheumatoid arthritis ([place])   . Thyroid nodule   . Vaginal Pap smear, abnormal    lgsil   Past Surgical History:       Past Surgical History:  Procedure Laterality Date  . ADENOIDECTOMY    . APPENDECTOMY  1965  . [place]   left  . CHOLECYSTECTOMY    . [place]  . laser vein surgery    . MANDIBLE RECONSTRUCTION  1992  . TOENAIL EXCISION  1998   several  . [place]  . VAGINAL HYSTERECTOMY  1983   als0-  anterior/posterior colporrhaphy  . VAGINAL WOUND CLOSURE / REPAIR  1999, 2000  . VAGINAL WOUND CLOSURE / REPAIR      Assessment & Plan Clinical Impression: Patient is a80 y.o.LH femalewith history of HTN, GERD, RA, lung nodule who was admitted to [facility] on 04/24/17 with multiple falls and MRI brain without acute findings and cervical spondylosis with central protrusion at C4/5. She was sent home and started developing left sided weakness withleft facial weakness and slurred speech. 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 MRA brain/neck was negative for occlusion or stenosis. 2D echo with EF 50-55% and grade 1 diastolic dysfunction. Stroke felt to be due to small vessel disease and Dr. Doy Mince recommended low dose ASA and plavix for secondary stroke prevention. She did have increase in weakness LUE on 4/30 and was treated with fluid bolus. She was started on dysphagia 3, thin liquids with aspiration precautions and educated on OME. Patient with resultant left sided weakness, right lean and decreased in ability to WB on LLE. She has had significant decline in mobility and ability to carry out ADL tasks.  Patient transferred to CIR on 04/27/2017 .   Pt presents with decreased activity tolerance, decreased functional mobility, decreased balance, decreased coordination, left inattention  Limiting pt's independence with leisure/community pursuits.  Leisure History/Participation Premorbid leisure interest/current  participation: [name] - Flower gardening;Nature - [place] care;Community - Shopping mall;Community - Grocery store (watch grandkids play sports; Always busy) Leisure Participation Style: Alone;With Family/Friends Awareness of Community Resources: Good-identify 3 post discharge leisure resources Psychosocial / Spiritual Patient agreeable to Pet Therapy: Yes Does patient have pets?: Yes Social interaction -  Mood/Behavior: Cooperative Academic librarian Appropriate for Education?: Yes Patient Agreeable to Outing?: Yes Recreational Therapy Orientation Orientation -Reviewed with patient: Available activity resources Strengths/Weaknesses Patient Strengths/Abilities: Willingness to participate;Active premorbidly Patient weaknesses: Physical limitations TR Patient demonstrates impairments in the following area(s): Edema;Endurance;Motor;Safety  Plan Rec Therapy Plan Is patient appropriate for Therapeutic Recreation?: Yes Rehab Potential: Good Treatment times per week: Min 1 TR group/session >20 minutes during LOS Estimated Length of Stay: 3 weeks TR Treatment/Interventions: Adaptive equipment instruction;1:1 session;Balance/vestibular training;Functional mobility training;Community reintegration;Cognitive remediation/compensation;Group participation (Comment);Patient/family education;Therapeutic activities;Recreation/leisure participation;Therapeutic exercise;UE/LE Coordination activities;Visual/perceptual remediation/compensation;Wheelchair propulsion/positioning Recommendations for other services: Neuropsych  Recommendations for other services: Neuropsych  Discharge Criteria: Patient will be discharged from TR if patient refuses treatment 3 consecutive times without medical reason.  If treatment goals not met, if there is a change in medical status, if patient makes no progress towards goals or if patient is discharged from hospital.  The above assessment, treatment plan, treatment alternatives and goals were discussed and mutually agreed upon: by patient  [place] 05/03/2017, 3:51 PM

## 2017-05-03 NOTE — Progress Notes (Signed)
Physical Therapy Session Note  Patient Details  Name: Donna Rhodes MRN: [id number] Date of Birth: [birth year]  Today's Date: 05/03/2017 PT Individual Time: 1400-1524 PT Individual Time Calculation (min): 84 min   Short Term Goals: Week 1:  PT Short Term Goal 1 (Week 1): Pt will perform bedmobility consistently with min assist  PT Short Term Goal 2 (Week 1): Pt will performed bed<>WC transfers with min assist and LRAD  PT Short Term Goal 3 (Week 1): Pt will ambulate 85ft with mod assist  PT Short Term Goal 4 (Week 1): Pt will propell WC 179ft with min assist  PT Short Term Goal 5 (Week 1): Pt will maintain standing balance with min-mod assist for 2 minutes    Skilled Therapeutic Interventions/Progress Updates:    Pt emotional after having a visitor and appears down throughout session with flat affect. Encouragement provided throughout session as pt easily frustrated and willing to "give up" easily. Daughter, Donna Rhodes also present and encouraging.   Neuro re-ed during w/c mobility using BLE and RUE for activation of LLE, reciprocal movement pattern re-training and attention to LLE placement x 50' with supervision to min assist and significant amount of extra time. Encouraged this as an activity daughter could work on with patient outside of therapy as well. Pt required mod cues for attention to L foot placement and sequencing.  Attempted gait with hemiwalker with mod assist for sit <> stand but pt with strong L lateral lean and unable to advance RLE despite multiple attempts. Pt request to work on [employer] first and performed 8 min on level 3 with BLE and BUE (with adaptive piece for LUE) to address strength, reciprocal movement pattern re-training, and overall endurance. Gait in hallway with rail on R with max assist for facilitation of weighshift, approximation through LLE during stance, and upright posture x 30' with w/c follow for increased patient confidence and mirror in front for visual feedback  to body positioning in space.  Emotional support provided during rest breaks and encouraged patient to write down 3 things each day that she did well or something positive from the day to be able to reflect on her progress and she seems focused on the negative. Pt and daughter in agreement with this and daughter giving her multiple examples of things just from today. Pt able to smile and crack a joke even during this discussion.   Transfer training w/c <> mat with stand pivot and squat pivot techniques throughout session with min to mod assist and max cues for safe set-up of transfer, L foot placement, and technique. Neuro re-ed on mat table for flat surface bed mobility retraining and education on LUE management during rolling and supine <> sit with mod assist overall for facilitation at trunk to come into sitting. Supine bridging for glut and core activation and BLE activation as well as LLE modified bridging and maintaining LLE in neutral hooklying position x 10 reps each. Pt easily fatigued and rest breaks needed. End of session chose to stay up at sink for oral hygiene with daughter and all needs in reach.   Therapy Documentation Precautions:  Precautions Precautions: Fall Precaution Comments: L hemiplegia Restrictions Weight Bearing Restrictions: No  Pain: Denies pain. Reports fatigue.   See Function Navigator for Current Functional Status.   Therapy/Group: Individual Therapy  Canary Brim Ivory Broad, PT, DPT  05/03/2017, 3:42 PM

## 2017-05-03 NOTE — Consult Note (Signed)
Neuropsychological Consultation   Patient:   Donna Rhodes   DOB:   1946/01/01  MR Number:  [id number]  Location:  [facility] [address] [id number] [place] [address] Dept: [phone] Loc: [phone]           Date of Service:   05/03/2017  Start Time:   8 AM End Time:   9 AM  Provider/Observer:  Ilean Skill, Psy.D.       Clinical Neuropsychologist       Billing Code/Service: [phone] 4 Units  Chief Complaint:    The patient was referred for neuropsychological/psychological consultation due to development of depressive symptoms due to acute loss of function from Right CVA.  Reason for Service:  Donna Rhodes an 71 y.o.LH female.  She presented after return from ED due to left side weakness and left facial weakness and slurred speech.  MRI showed acute infarct in right basal ganglia and posterior limb internal capsule.   Stroke felt due to small vessel disease.  CIR recommended for follow up therapy.  Current Status:  The patient has experienced crying spells and other smptoms of depressive response to acute loss of motor function on Left side.    Reliability of Information: Information from review of medical chart, review with treatment team and 1 hour face to face with patient and discussion with patient's daughter.  Behavioral Observation: Donna Rhodes  presents as a 71 y.o.-year-old Left Caucasian Female who appeared her stated age. her dress was Appropriate and she was Well Groomed and her manners were Appropriate to the situation.  her participation was indicative of Appropriate and Attentive behaviors.  There were physical disabilities noted related to left arm and leg weakness/paralysis.  she displayed an appropriate level of cooperation and motivation.     Interactions:    Active Appropriate and Attentive  Attention:   abnormal and attention span appeared shorter than  expected for age  Memory:   within normal limits; recent and remote memory intact  Visuo-spatial:  within normal limits  Speech (Volume):  low  Speech:   normal;   Thought Process:  Coherent  Though Content:  WNL;   Orientation:   person, place, time/date and situation  Judgment:   Good  Planning:   Good  Affect:    Depressed  Mood:    Depressed  Insight:   Present  Intelligence:   normal  Medical History:   Past Medical History:  Diagnosis Date  . Atrophic vaginitis   . Dysplasia of vagina    vin 1 - vulva and vaginal cuff  . GERD (gastroesophageal reflux disease)   . HCV infection   . HSV (herpes simplex virus) infection   . Hypercholesterolemia   . Hypertension   . Hypothyroidism   . Lung nodule    stable  . Menopausal state   . Osteoarthritis    hand involvement, cervical disc disease  . Osteoporosis   . Pelvic pain in female   . Rheumatoid arthritis ([place])   . Thyroid nodule   . Vaginal Pap smear, abnormal    lgsil          Family Med/Psych History:  Family History  Problem Relation Age of Onset  . Hypertension Father   . Osteoarthritis Mother   . Arthritis/Rheumatoid Sister   . Breast cancer      paternal side  . Breast cancer Paternal Aunt   . Breast cancer  Cousin     maternal  . Uterine cancer Maternal Grandmother   . Ovarian cancer Neg Hx   . Heart disease Neg Hx   . Diabetes Neg Hx   . Colon cancer Neg Hx     Risk of Suicide/Violence: low The patient does have the development of depressive symptoms but does not report any SI.  Impression/DX:  The patient is a 71 year old with right side basal ganglia CVA likely related to small vessel disease.  She has loss function of left side motor function and has developed adjustment issues with depressive symptoms.  Disposition/Plan:  Will see the patient again on 05/05/2017 for further therapeutic efforts for adjustment disorder.  Diagnosis:    Adjustment Disorder with depression.           Electronically Signed   _______________________ Ilean Skill, Psy.D.

## 2017-05-03 NOTE — Progress Notes (Signed)
Pharmacy Antibiotic Note Donna Rhodes is a 71 y.o. female admitted on 04/27/2017. Concern for PNA on Cefepime and vancomycin.  Vancomycin trough level drawn ~ 10 hrs after last dose, lower than desired goal of 15-20. (9)  Plan: 1. Continue Cefepime 1 gram IV every 12 hours  2. Increase vancomycin to 750 mg q 8 hrs; goal trough 15-20 3. Recheck vancomycin trough at steady state as necessary.  Height: 5\' 3"  (160 cm) Weight: 123 lb 14.4 oz (56.2 kg) IBW/kg (Calculated) : 52.4  Temp (24hrs), Avg:98.8 F (37.1 C), Min:98.7 F (37.1 C), Max:98.9 F (37.2 C)   Recent Labs Lab 04/28/17 0714 05/02/17 0511 05/03/17 2130  WBC 6.6  --   --   CREATININE 0.76 0.76  --   VANCOTROUGH  --   --  9*    Estimated Creatinine Clearance: 53.4 mL/min (by C-G formula based on SCr of 0.76 mg/dL).    Allergies  Allergen Reactions  . Amoxicillin Other (See Comments)    Irritated tongue  . Avelox [Moxifloxacin Hcl In Nacl]   . Azithromycin Other (See Comments)    Caused mouth and tongue to be sore  . Codeine   . Estroven Weight Management [Nutritional Supplements]     Mouth soreness  . Food     Walnuts --mouth breaks out  . Moxifloxacin Other (See Comments)  . Naproxen     Antimicrobials this admission:  Cefepime 5/5 >>  Vancomycin 5/5 >>   Dose adjustments this admission:  5/7 Vanc trough = 9 on 500 mg q 12 hrs, adjusted to 750 mg q 8 hrs  Microbiology results:  5/5 UCx: insignificant growth  Thank you for allowing pharmacy to be a part of this patient's care. Uvaldo Rising, BCPS  Clinical Pharmacist Pager [phone]  05/03/2017 10:41 PM

## 2017-05-03 NOTE — Progress Notes (Signed)
Speech Language Pathology Daily Session Note  Patient Details  Name: Donna Rhodes MRN: [id number] Date of Birth: August 15, 1946  Today's Date: 05/03/2017 SLP Individual Time: 0915-1000 SLP Individual Time Calculation (min): 45 min  Short Term Goals: Week 1: SLP Short Term Goal 1 (Week 1): Patient will demonstrate efficient mastication and oral clearnace with trials of regular textures without overt s/s of aspiration with Mod I.  SLP Short Term Goal 2 (Week 1): Patient will demonstrate complex problem solving for functional tasks with Mod I.  SLP Short Term Goal 3 (Week 1): Patient will attend to left field of enviornment during functional tasks with Mod I.   Skilled Therapeutic Interventions: Skilled treatment session focused on addressing dysphagia and dysarthria goals. SLP facilitated session by providing set-up assist and Max assist multimodal cues for sustained attention to self-feeding task as well as PO boluses.  Patient would initiate mastication but not follow through on oral clearance without the use of a thin liquid wash.  As a result trials were limited to 4.  Patient with no overt s/s of aspiration; however, lethergy could increase aspiration risk.  Signs modified during session as well as education completed with her daughter.  Continue with current plan of care.    Function:  Eating Eating   Modified Consistency Diet: No (trials with SLP) Eating Assist Level: Set up assist for;Supervision or verbal cues;Helper checks for pocketed food   Eating Set Up Assist For: Opening containers       Cognition Comprehension Comprehension assist level: Understands complex 90% of the time/cues 10% of the time  Expression   Expression assist level: Expresses basic 75 - 89% of the time/requires cueing 10 - 24% of the time. Needs helper to occlude trach/needs to repeat words.  Social Interaction Social Interaction assist level: Interacts appropriately 25 - 49% of time - Needs frequent  redirection.  Problem Solving Problem solving assist level: Solves basic 25 - 49% of the time - needs direction more than half the time to initiate, plan or complete simple activities  Memory Memory assist level: More than reasonable amount of time    Pain Pain Assessment Pain Assessment: No/denies pain  Therapy/Group: Individual Therapy  Donna Rhodes., CCC-SLP [phone]  [place] 05/03/2017, 2:15 PM

## 2017-05-04 ENCOUNTER — Inpatient Hospital Stay (HOSPITAL_COMMUNITY): Payer: PPO | Admitting: Speech Pathology

## 2017-05-04 ENCOUNTER — Inpatient Hospital Stay (HOSPITAL_COMMUNITY): Payer: PPO | Admitting: Physical Therapy

## 2017-05-04 ENCOUNTER — Inpatient Hospital Stay (HOSPITAL_COMMUNITY): Payer: PPO | Admitting: Occupational Therapy

## 2017-05-04 ENCOUNTER — Inpatient Hospital Stay (HOSPITAL_COMMUNITY): Payer: PPO | Admitting: *Deleted

## 2017-05-04 DIAGNOSIS — F4323 Adjustment disorder with mixed anxiety and depressed mood: Secondary | ICD-10-CM

## 2017-05-04 LAB — CBC
HCT: 33.4 % — ABNORMAL LOW (ref 36.0–46.0)
Hemoglobin: 10.5 g/dL — ABNORMAL LOW (ref 12.0–15.0)
MCH: 29.2 pg (ref 26.0–34.0)
MCHC: 31.4 g/dL (ref 30.0–36.0)
MCV: 93 fL (ref 78.0–100.0)
PLATELETS: 301 10*3/uL (ref 150–400)
RBC: 3.59 MIL/uL — AB (ref 3.87–5.11)
RDW: 14.4 % (ref 11.5–15.5)
WBC: 8 10*3/uL (ref 4.0–10.5)

## 2017-05-04 LAB — BASIC METABOLIC PANEL
Anion gap: 8 (ref 5–15)
BUN: 15 mg/dL (ref 6–20)
CO2: 25 mmol/L (ref 22–32)
CREATININE: 0.67 mg/dL (ref 0.44–1.00)
Calcium: 8.4 mg/dL — ABNORMAL LOW (ref 8.9–10.3)
Chloride: 102 mmol/L (ref 101–111)
GFR calc non Af Amer: >60 mL/min (ref >60)
Glucose, Bld: 120 mg/dL — ABNORMAL HIGH (ref 65–99)
Potassium: 3.8 mmol/L (ref 3.5–5.1)
SODIUM: 135 mmol/L (ref 135–145)

## 2017-05-04 NOTE — Progress Notes (Signed)
Physical Therapy Session Note  Patient Details  Name: Donna Rhodes MRN: [id number] Date of Birth: 1946-04-17  Today's Date: 05/04/2017 PT Individual Time: 1500-1530 PT Individual Time Calculation (min): 30 min   Short Term Goals: Week 1:  PT Short Term Goal 1 (Week 1): Pt will perform bedmobility consistently with min assist  PT Short Term Goal 2 (Week 1): Pt will performed bed<>WC transfers with min assist and LRAD  PT Short Term Goal 3 (Week 1): Pt will ambulate 63f with mod assist  PT Short Term Goal 4 (Week 1): Pt will propell WC 1068fwith min assist  PT Short Term Goal 5 (Week 1): Pt will maintain standing balance with min-mod assist for 2 minutes    Skilled Therapeutic Interventions/Progress Updates:    no c/o pain.  Session focus on NMR for reciprocal stepping pattern, LLE coordination, and strengthening.    Pt taken to therapy gym total assist for time management.  Stand/pivot from w/c>nustep on L with mod assist for maintaining balance with pivot, LLE with decreased weight bearing and ?supination when attempting to pivot.  nustep x8 minutes with BLEs at level 2 focus on maintaining steps/min >25, and maintaining neutral LLE positioning.  Squat/pivot back to w/c on R with min guard for safety.  W/C propulsion with RUE and BLEs for LLE NMR, min assist for straight path and min fade to mod cues for use of LLE.  Pt agreeable to stay up in w/c at end of session, call bell in reach and needs met.   Therapy Documentation Precautions:  Precautions Precautions: Fall Precaution Comments: L hemiplegia Restrictions Weight Bearing Restrictions: No   See Function Navigator for Current Functional Status.   Therapy/Group: Individual Therapy  CaEarnest Conroyenven-Crew 05/04/2017, 4:28 PM

## 2017-05-04 NOTE — Progress Notes (Signed)
Physical Therapy Note  Patient Details  Name: Donna Rhodes MRN: [id number] Date of Birth: 03-Feb-1946 Today's Date: 05/04/2017    Time: 829-937 16 minutes  1:1 Pt c/o headache, meds given during session.  Pt requests to use bathroom.  Bed mobility with min A for Lt LE and trunk.  Stand pivot transfer to w/c and to [facility] over toilet with mod A. Cues for UE placement with each transfer as pt wants to pull on PT.  Pt able to have successful bowel movement and perform hygiene with supervision. w/c mobility with bilat LEs for Lt LE strength and coordination x 30' with min A.  Standing NMR with focus on midline orientation and activation of Lt hip and knee with reaching task, mirror, sit to stands without UE support, wt shifts and attempting stepping. Pt requires max A for standing balance and continues to be unable to tell she is pushing and falling Lt despite mirror and verbal/visual cues.  Pt in good spirits this treatment and states she feels she "made progress"   Brandy Zuba 05/04/2017, 8:55 AM

## 2017-05-04 NOTE — Progress Notes (Signed)
Speech Language Pathology Daily Session Note  Patient Details  Name: Donna Rhodes MRN: [id number] Date of Birth: Jan 31, 1946  Today's Date: 05/04/2017 SLP Individual Time: 1105-1204 SLP Individual Time Calculation (min): 59 min  Short Term Goals: Week 1: SLP Short Term Goal 1 (Week 1): Patient will demonstrate efficient mastication and oral clearnace with trials of regular textures without overt s/s of aspiration with Mod I.  SLP Short Term Goal 2 (Week 1): Patient will demonstrate complex problem solving for functional tasks with Mod I.  SLP Short Term Goal 3 (Week 1): Patient will attend to left field of enviornment during functional tasks with Mod I.   Skilled Therapeutic Interventions:  Pt was seen for skilled ST targeting cognitive and dysphagia goals.  SLP facilitated the session with a trial snack of regular textures to continue working towards diet progression.  Pt continues to present with buccal residue with advanced textures but no overt s/s of aspiration were evident with solids or liquids and the remainder of residue was removed via oral care at the end of today's therapy session.  For now, recommend that pt remain on dys 3 solids.  Therapist facilitated the session with money management tasks to address problem solving goals.  Pt counted money and made change for 80% accuracy which improved to 100% accuracy with supervision verbal cues.  Pt also wrote a check and completed functional math calculations from word problems with supervision verbal cues for 100% accuracy.  Pt was returned to room and left in chair with call bell within reach.  Continue per current plan of care.       Function:  Eating Eating   Modified Consistency Diet: No Eating Assist Level: Set up assist for;Supervision or verbal cues   Eating Set Up Assist For: Opening containers       Cognition Comprehension Comprehension assist level: Understands basic 90% of the time/cues < 10% of the time  Expression    Expression assist level: Expresses basic 75 - 89% of the time/requires cueing 10 - 24% of the time. Needs helper to occlude trach/needs to repeat words.  Social Interaction Social Interaction assist level: Interacts appropriately 75 - 89% of the time - Needs redirection for appropriate language or to initiate interaction.  Problem Solving Problem solving assist level: Solves basic 75 - 89% of the time/requires cueing 10 - 24% of the time  Memory Memory assist level: Recognizes or recalls 75 - 89% of the time/requires cueing 10 - 24% of the time    Pain Pain Assessment Pain Assessment: No/denies pain  Therapy/Group: Individual Therapy  Sean Macwilliams, Selinda Orion 05/04/2017, 4:07 PM

## 2017-05-04 NOTE — Progress Notes (Signed)
Subjective/Complaints:   No issues overnite, mild HA this am but this has not been consistent  ROS-  +fever, thrush. Denies N/V/D, CP or SOB  Objective: Vital Signs: Blood pressure 126/65, pulse 84, temperature 98.6 F (37 C), temperature source Oral, resp. rate 19, height '5\' 3"'  (1.6 m), weight 56.8 kg (125 lb 3.5 oz), last menstrual period 12/29/1981, SpO2 94 %. No results found. Results for orders placed or performed during the hospital encounter of 04/27/17 (from the past 72 hour(s))  Culture, Urine     Status: Abnormal   Collection Time: 05/01/17  3:02 PM  Result Value Ref Range   Specimen Description URINE, RANDOM    Special Requests nystatin    Culture <10,000 COLONIES/mL INSIGNIFICANT GROWTH (A)    Report Status 05/02/2017 FINAL   Urinalysis, Routine w reflex microscopic     Status: Abnormal   Collection Time: 05/01/17  3:02 PM  Result Value Ref Range   Color, Urine AMBER (A) YELLOW    Comment: BIOCHEMICALS MAY BE AFFECTED BY COLOR   APPearance CLEAR CLEAR   Specific Gravity, Urine 1.025 1.005 - 1.030   pH 6.0 5.0 - 8.0   Glucose, UA NEGATIVE NEGATIVE mg/dL   Hgb urine dipstick NEGATIVE NEGATIVE   Bilirubin Urine NEGATIVE NEGATIVE   Ketones, ur NEGATIVE NEGATIVE mg/dL   Protein, ur 100 (A) NEGATIVE mg/dL   Nitrite NEGATIVE NEGATIVE   Leukocytes, UA NEGATIVE NEGATIVE   RBC / HPF 0-5 0 - 5 RBC/hpf   WBC, UA 0-5 0 - 5 WBC/hpf   Bacteria, UA NONE SEEN NONE SEEN   Squamous Epithelial / LPF 0-5 (A) NONE SEEN   Mucous PRESENT   Basic metabolic panel     Status: Abnormal   Collection Time: 05/02/17  5:11 AM  Result Value Ref Range   Sodium 136 135 - 145 mmol/L   Potassium 3.4 (L) 3.5 - 5.1 mmol/L   Chloride 100 (L) 101 - 111 mmol/L   CO2 24 22 - 32 mmol/L   Glucose, Bld 106 (H) 65 - 99 mg/dL   BUN 13 6 - 20 mg/dL   Creatinine, Ser 0.76 0.44 - 1.00 mg/dL   Calcium 8.5 (L) 8.9 - 10.3 mg/dL   GFR calc non Af Amer >60 >60 mL/min   GFR calc Af Amer >60 >60 mL/min     Comment: (NOTE) The eGFR has been calculated using the CKD EPI equation. This calculation has not been validated in all clinical situations. eGFR's persistently <60 mL/min signify possible Chronic Kidney Disease.    Anion gap 12 5 - 15  Vancomycin, trough     Status: Abnormal   Collection Time: 05/03/17  9:30 PM  Result Value Ref Range   Vancomycin Tr 9 (L) 15 - 20 ug/mL  Basic metabolic panel     Status: Abnormal   Collection Time: 05/04/17  6:37 AM  Result Value Ref Range   Sodium 135 135 - 145 mmol/L   Potassium 3.8 3.5 - 5.1 mmol/L   Chloride 102 101 - 111 mmol/L   CO2 25 22 - 32 mmol/L   Glucose, Bld 120 (H) 65 - 99 mg/dL   BUN 15 6 - 20 mg/dL   Creatinine, Ser 0.67 0.44 - 1.00 mg/dL   Calcium 8.4 (L) 8.9 - 10.3 mg/dL   GFR calc non Af Amer >60 >60 mL/min   GFR calc Af Amer >60 >60 mL/min    Comment: (NOTE) The eGFR has been calculated using the CKD EPI equation.  This calculation has not been validated in all clinical situations. eGFR's persistently <60 mL/min signify possible Chronic Kidney Disease.    Anion gap 8 5 - 15  CBC     Status: Abnormal   Collection Time: 05/04/17  6:37 AM  Result Value Ref Range   WBC 8.0 4.0 - 10.5 K/uL   RBC 3.59 (L) 3.87 - 5.11 MIL/uL   Hemoglobin 10.5 (L) 12.0 - 15.0 g/dL   HCT 33.4 (L) 36.0 - 46.0 %   MCV 93.0 78.0 - 100.0 fL   MCH 29.2 26.0 - 34.0 pg   MCHC 31.4 30.0 - 36.0 g/dL   RDW 14.4 11.5 - 15.5 %   Platelets 301 150 - 400 K/uL     HEENT: scalp abrasion left pariental with dermabond non tender.   Cardio: RRR and no JVD Resp: upper airway sounds and unlabored GI: BS positive and ND Skin:   Other see above. Warm and dary Neuro:  Alert Motor: LUE 0/5  LLE: HF, KE 3-/5, ADF/PF 0/5 Musc/Skel:  Other Left shoulder subluxation , bilateral hand MCP adn PIP chronic swelling no acute synovitis Gen NAD. Well-developed.   Assessment/Plan: 1. Functional deficits secondary to Left hemiparesis which require 3+ hours per day of  interdisciplinary therapy in a comprehensive inpatient rehab setting. Physiatrist is providing close team supervision and 24 hour management of active medical problems listed below. Physiatrist and rehab team continue to assess barriers to discharge/monitor patient progress toward functional and medical goals. FIM: Function - Bathing Position: Shower (sitting on tub transfer bench) Body parts bathed by patient: Left arm, Chest, Abdomen, Front perineal area, Right upper leg, Left upper leg Body parts bathed by helper: Chest, Abdomen, Front perineal area, Buttocks, Right upper leg, Left upper leg, Left arm Bathing not applicable: Left lower leg, Back, Right lower leg, Right arm Assist Level: Touching or steadying assistance(Pt > 75%)  Function- Upper Body Dressing/Undressing What is the patient wearing?: Pull over shirt/dress Pull over shirt/dress - Perfomed by patient: Thread/unthread right sleeve, Thread/unthread left sleeve Pull over shirt/dress - Perfomed by helper: Put head through opening, Pull shirt over trunk Assist Level: Touching or steadying assistance(Pt > 75%) Function - Lower Body Dressing/Undressing What is the patient wearing?: Underwear, Pants, Non-skid slipper socks Position: Wheelchair/chair at sink Underwear - Performed by patient: Thread/unthread right underwear leg Underwear - Performed by helper: Thread/unthread left underwear leg, Pull underwear up/down Pants- Performed by patient: Thread/unthread right pants leg Pants- Performed by helper: Thread/unthread left pants leg, Pull pants up/down, Fasten/unfasten pants Non-skid slipper socks- Performed by helper: Don/doff right sock, Don/doff left sock Assist for footwear: Maximal assist Assist for lower body dressing:  (total assist)  Function - Toileting Toileting steps completed by patient: Performs perineal hygiene Toileting steps completed by helper: Adjust clothing prior to toileting, Adjust clothing after  toileting Toileting Assistive Devices: Grab bar or rail Assist level: Two helpers, Touching or steadying assistance (Pt.75%)  Function - Air cabin crew transfer assistive device: Bedside commode Assist level to toilet: Maximal assist (Pt 25 - 49%/lift and lower) Assist level from toilet: Maximal assist (Pt 25 - 49%/lift and lower) Assist level to bedside commode (at bedside): 2 helpers Assist level from bedside commode (at bedside): 2 helpers  Function - Chair/bed transfer Chair/bed transfer method: Stand pivot Chair/bed transfer assist level: Moderate assist (Pt 50 - 74%/lift or lower) Chair/bed transfer assistive device: Armrests Chair/bed transfer details: Tactile cues for weight shifting  Function - Locomotion: Wheelchair Will patient use wheelchair at  discharge?: Yes Type: Manual Max wheelchair distance: 52' Assist Level: Touching or steadying assistance (Pt > 75%) Assist Level: Touching or steadying assistance (Pt > 75%) Assist Level: Touching or steadying assistance (Pt > 75%) Turns around,maneuvers to table,bed, and toilet,negotiates 3% grade,maneuvers on rugs and over doorsills: No Function - Locomotion: Ambulation Assistive device: Rail in hallway Max distance: 30' Assist level: Maximal assist (Pt 25 - 49%) Assist level: Maximal assist (Pt 25 - 49%) (+2 for w/c follow) Walk 50 feet with 2 turns activity did not occur: Safety/medical concerns Walk 150 feet activity did not occur: Safety/medical concerns Walk 10 feet on uneven surfaces activity did not occur: Safety/medical concerns  Function - Comprehension Comprehension: Auditory Comprehension assist level: Understands complex 90% of the time/cues 10% of the time  Function - Expression Expression: Verbal Expression assist level: Expresses basic 75 - 89% of the time/requires cueing 10 - 24% of the time. Needs helper to occlude trach/needs to repeat words.  Function - Social Interaction Social Interaction  assist level: Interacts appropriately 25 - 49% of time - Needs frequent redirection.  Function - Problem Solving Problem solving assist level: Solves basic 25 - 49% of the time - needs direction more than half the time to initiate, plan or complete simple activities  Function - Memory Memory assist level: More than reasonable amount of time Patient normally able to recall (first 3 days only): Current season, Location of own room, That he or she is in a hospital   Medical Problem List and Plan: 1. 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 DVT Prophylaxis/Anticoagulation: Pharmaceutical: Lovenox 3. Pain Management: tylenol prn 4. Mood: Seems flat--question fatigue. Team to provide ego support. LCSW to follow for evaluation and support.  -neuropsych for mood assessment 5. Neuropsych: This patient iscapable of making decisions on herown behalf. 6. Skin/Wound Care: Monitor I/O 7. Fluids/Electrolytes/Nutrition: Monitor I/O. BMET normal, enc fluids  8. HTN: Montior BP bid. On Norvasc daily   Controlled 5/5 Vitals:   05/03/17 1531 05/04/17 0512  BP: 125/77 126/65  Pulse: 97 84  Resp: 18 19  Temp: 98.7 F (37.1 C) 98.6 F (37 C)   9. Dyslipidemia: Continue Lipitor.  10. Hypothyroid: On supplement 11. RA: Managed with methotrexate injections weekly.  12. Anorexia: Will liberalize diet to regular to help with food choices.  13. RLL PNA  Probable recent aspiration, right lower-mid rales on exam -IS -afeb on vanc 14.  Hypoalb- mild start prostat 15. Oral Thrush-whitish plaque on tongue but not buccal mucosa  Nystatin d/ced, diflucan ordered 5/5   LOS (Days) 7 A FACE TO FACE EVALUATION WAS PERFORMED  Devone Tousley E 05/04/2017, 8:19 AM

## 2017-05-04 NOTE — Progress Notes (Signed)
Occupational Therapy Session Note  Patient Details  Name: Donna Rhodes MRN: [id number] Date of Birth: 01-May-1946  Today's Date: 05/04/2017  Session 1 OT Individual Time: 0900-1000 OT Individual Time Calculation (min): 60 min   Session 2 OT Individual Time: 1401-1433 OT Individual Time Calculation (min): 32 min   Short Term Goals: Week 1:  OT Short Term Goal 1 (Week 1): Pt will complete LB dressing with MOD A OT Short Term Goal 2 (Week 1): Pt will complete UB dressing using hemi-techniques OT Short Term Goal 3 (Week 1): Pt will maintain dynamic standing balance with mod A x1 in preparation for ADLa OT Short Term Goal 4 (Week 1): Pt will demonstrate self-ROM on L UE with min cues  Skilled Therapeutic Interventions/Progress Updates:    Session 1 OT treatment session focused on modified bathing/dressing, toiletting strategies, toilet transfers, improved sit<>stand, and utilizing L UE as a stabilizer. Pt greeted sitting in wc, slightly improved mood today. Pt reported need for bathroom, stand-pivot transfer wc>raised toilet with Mod A. Pt with small smear of BM, able to achieve hip hike and leaning method to complete toileting with set up and min guard A for balance lean to L. Pt completed wc>shower transfer in similar fashion. Pt request to shave legs today. Pt able to achieve figure 4 position to shave legs with set-up A. Hand-over hand A to integrate L UE into bathing-focus on push/pull and weight bearing. Pt required extended rest break after showering prior to dressing. Reviewed hemi-techniques for dressing with assist to thread L LE into pant leg and R UE into shirt. Sit<>stand Min A, Min-Mod A standing balance while pt pulled pants over hips.    Session 2 OT treatment session focused on functional transfers and L NMR. Rec therapist present for treatment session. Pt reported need for bathroom, stand-pivot transfer to toilet w/ Mod A. Pt voided bladder and completed peri-care with set-up,  Max A to manage clothing. L UE e-stem with 0 pain noted before and after.  L NMR incorporated during e-stem using joint input to bring pt through full ROM. Pt demonstrated active forearm pronation after OT brought pt into supination. Pt returned to room at end of session and left seated in wc with needs met.   Therapy Documentation Precautions:  Precautions Precautions: Fall Precaution Comments: L hemiplegia Restrictions Weight Bearing Restrictions: No Pain:   none/denies pain ADL: ADL ADL Comments: Please see functional navigator  See Function Navigator for Current Functional Status.   Therapy/Group: Individual Therapy  Valma Cava 05/04/2017, 3:15 PM

## 2017-05-05 ENCOUNTER — Encounter (HOSPITAL_COMMUNITY): Payer: PPO | Admitting: Psychology

## 2017-05-05 ENCOUNTER — Inpatient Hospital Stay (HOSPITAL_COMMUNITY): Payer: PPO | Admitting: Physical Therapy

## 2017-05-05 ENCOUNTER — Inpatient Hospital Stay (HOSPITAL_COMMUNITY): Payer: PPO | Admitting: Speech Pathology

## 2017-05-05 ENCOUNTER — Inpatient Hospital Stay (HOSPITAL_COMMUNITY): Payer: PPO | Admitting: Occupational Therapy

## 2017-05-05 MED ORDER — TEMAZEPAM 7.5 MG PO CAPS
7.5000 mg | ORAL_CAPSULE | Freq: Every evening | ORAL | Status: DC | PRN
  Administered 2017-05-06 – 2017-05-11 (×4): 7.5 mg via ORAL
  Filled 2017-05-05 (×4): qty 1

## 2017-05-05 MED ORDER — CITALOPRAM HYDROBROMIDE 10 MG PO TABS
10.0000 mg | ORAL_TABLET | Freq: Every day | ORAL | Status: DC
  Administered 2017-05-05 – 2017-05-19 (×15): 10 mg via ORAL
  Filled 2017-05-05 (×15): qty 1

## 2017-05-05 NOTE — Progress Notes (Signed)
Physical Therapy Weekly Progress Note  Patient Details  Name: Donna Rhodes MRN: [id number] Date of Birth: [birth year]  Beginning of progress report period: Apr 28, 2017 End of progress report period: May 05, 2017  Today's Date: 05/05/2017 PT Individual Time: 1300-1400 PT Individual Time Calculation (min): 60 min   Patient has met 2 of 5 short term goals.  Pt has made slow progress with therapy this reporting period secondary to medical complications from PNA.  Pt continues to require consistent mod assist for all mobility and demonstrates decreased postural control and awareness of midline orientation in standing.   Patient continues to demonstrate the following deficits muscle weakness, decreased cardiorespiratoy endurance, impaired timing and sequencing, unbalanced muscle activation, decreased coordination and decreased motor planning, decreased midline orientation and decreased attention to left and decreased sitting balance, decreased standing balance, decreased postural control, hemiplegia and decreased balance strategies and therefore will continue to benefit from skilled PT intervention to increase functional independence with mobility.  Patient progressing toward long term goals..  Currently pt is making slow progress towards LTGs, will reassess throughout LOS and adjust goals as needed.   PT Short Term Goals Week 1:  PT Short Term Goal 1 (Week 1): Pt will perform bedmobility consistently with min assist  PT Short Term Goal 1 - Progress (Week 1): Met PT Short Term Goal 2 (Week 1): Pt will performed bed<>WC transfers with min assist and LRAD  PT Short Term Goal 2 - Progress (Week 1): Not met PT Short Term Goal 3 (Week 1): Pt will ambulate 42f with mod assist  PT Short Term Goal 3 - Progress (Week 1): Not met PT Short Term Goal 4 (Week 1): Pt will propell WC [id number] min assist  PT Short Term Goal 4 - Progress (Week 1): Met PT Short Term Goal 5 (Week 1): Pt will maintain standing  balance with min-mod assist for 2 minutes   PT Short Term Goal 5 - Progress (Week 1): Not met Week 2:  PT Short Term Goal 1 (Week 2): Pt will ambulate 3022with LRAD and mod assist PT Short Term Goal 2 (Week 2): Pt will propel w/c with supervision PT Short Term Goal 3 (Week 2): Pt will demo static standing balance with single UE support with min assist   Skilled Therapeutic Interventions/Progress Updates:    no c/o pain, but reports feeling tired and "foggy".  Session focus on w/c mobility, transfers, and NMR for midline orientation in standing and pregait.    Pt transitions supine>sitting EOB with supervision and increased time using bed rails and HOB elevated.  Squat/pivot throughout session to R and L with min assist for facilitating forward weight shift and head/hips relationship.  W/C mobility to therapy gym with R hemi-technique and supervision, pt requires increased time to negotiate around obstacles on L side.  Sit<>stand throughout session with no device, EVA walker, and RW with steady assist and verbal cues for foot placement and hand placement.  Standing focus on midline orientation with eyes open with and without mirror feedback and with eyes closed, close supervision to steady assist for balance.  Pt attempted ambulation with EVA walker but unable to place LLE for weight bearing, and unable to shift weight to R.  Transition to pre-gait activity of weight shifting R<>L progress to weight shifts with small lift of each LE.  Pt returned to room at end of session and positioned in bed with min assist for LLE.  Call bell in reach and needs  met.     Therapy Documentation Precautions:  Precautions Precautions: Fall Precaution Comments: L hemiplegia Restrictions Weight Bearing Restrictions: No   See Function Navigator for Current Functional Status.  Therapy/Group: Individual Therapy  Crystallynn Noorani E Penven-Crew 05/05/2017, 11:20 AM

## 2017-05-05 NOTE — Progress Notes (Signed)
Occupational Therapy Session Note  Patient Details  Name: Donna Rhodes MRN: [id number] Date of Birth: 07/28/1946  Today's Date: 05/05/2017 OT Individual Time: 1132-1200 OT Individual Time Calculation (min): 28 min   Short Term Goals: Week 1:  OT Short Term Goal 1 (Week 1): Pt will complete LB dressing with MOD A OT Short Term Goal 2 (Week 1): Pt will complete UB dressing using hemi-techniques OT Short Term Goal 3 (Week 1): Pt will maintain dynamic standing balance with mod A x1 in preparation for ADLa OT Short Term Goal 4 (Week 1): Pt will demonstrate self-ROM on L UE with min cues  Skilled Therapeutic Interventions/Progress Updates:    OT treatment session focused on standing balance/endurance. Sit<>stand Mod A at standing frame. Focus on hip extension and weight shifting with standing frame support. Pt tolerated ~10 mins at longest standing bout while participating in connect four game. Pt returned to room at end of session and returned to bed via squat-pivot transfer to R with min A. Pt left with needs met.  Therapy Documentation Precautions:  Precautions Precautions: Fall Precaution Comments: L hemiplegia Restrictions Weight Bearing Restrictions: No Pain: Pain Assessment Pain Assessment: No/denies pain ADL: ADL ADL Comments: Please see functional navigator  See Function Navigator for Current Functional Status.   Therapy/Group: Individual Therapy   S  05/05/2017, 12:26 PM 

## 2017-05-05 NOTE — Progress Notes (Signed)
Nutrition Follow-up  DOCUMENTATION CODES:   Not applicable  INTERVENTION:   -Continue Ensure Enlive po BID, each supplement provides 350 kcal and 20 grams of protein  NUTRITION DIAGNOSIS:   Inadequate oral intake related to poor appetite as evidenced by per patient/family report.  Ongoing  GOAL:   Patient will meet greater than or equal to 90% of their needs  Progressing  MONITOR:   PO intake, Supplement acceptance, Labs, Weight trends, Skin, I & O's  REASON FOR ASSESSMENT:   Malnutrition Screening Tool    ASSESSMENT:   71 y.o. LH female with history of HTN, GERD, RA, lung nodule who was admitted to [facility] on 04/24/17 with multiple falls and MRI brain without acute findings and cervical spondylosis with central protrusion at C4/5. She was sent home and started developing left sided weakness with left facial weakness and slurred speech.  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   Case discussed with RN, as pt was sleeping soundly at time at visit and requested not to wake.   Per RN, pt's appetite has improved over the past week. Pt's family has been bringing in outside food, such as chick-fil-a, which pt tolerates well. Meal completion variable; PO: 10-75%. RN reports that pt enjoys strawberry flavored Ensure and will continue to encourage pt to consume.   Pt continues with oral flush, but improving per RN.  Labs reviewed.   Diet Order:  DIET DYS 3 Room service appropriate? Yes; Fluid consistency: Thin  Skin:  Reviewed, no issues  Last BM:  05/04/17  Height:   Ht Readings from Last 1 Encounters:  04/27/17 5\' 3"  (1.6 m)    Weight:   Wt Readings from Last 1 Encounters:  05/05/17 120 lb 1.6 oz (54.5 kg)    Ideal Body Weight:  52.27 kg  BMI:  Body mass index is 21.27 kg/m.  Estimated Nutritional Needs:   Kcal:  1500-1800  Protein:  65-75 grams  Fluid:  >/= 1.5  L/day  EDUCATION NEEDS:   Education needs addressed  Raynetta Osterloh A. Jimmye Norman, RD, LDN, CDE Pager: [phone] After hours Pager: [phone]

## 2017-05-05 NOTE — Patient Care Conference (Signed)
Inpatient RehabilitationTeam Conference and Plan of Care Update Date: 05/05/2017   Time: 11:15 AM    Patient Name: Donna Rhodes      Medical Record Number: [id number]  Date of Birth: May 24, 1946 Sex: Female         Room/Bed: 4W23C/4W23C-01 Payor Info: Payor: Jed [employer] ADVANTAGE / Plan: Tennis Must / Product Type: *No Product type* /    Admitting Diagnosis: R CVA  Admit Date/Time:  04/27/2017  2:37 PM Admission Comments: No comment available   Primary Diagnosis:  Right-sided lacunar infarction [facility]) Principal Problem: Right-sided lacunar infarction [facility])  Patient Active Problem List   Diagnosis Date Noted  . Adjustment disorder with mixed anxiety and depressed mood   . Hypokalemia   . HCAP (healthcare-associated pneumonia)   . Cough with fever   . Flaccid monoplegia of upper extremity ([place])   . FUO (fever of unknown origin)   . Oral thrush   . Benign essential HTN   . Rheumatoid arthritis involving both hands ([place]) 04/28/2017  . 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[place]) 04/27/2017  . CVA (cerebral vascular accident) ([place]) 04/24/2017  . LGSIL Pap smear of vagina 12/15/2016  . Anal skin tag 02/02/2016  . Inflammatory arthritis 01/19/2016  . Right knee pain 01/19/2016  . Genital herpes 10/20/2015  . GERD (gastroesophageal reflux disease) 10/20/2015  . Status post vaginal hysterectomy 10/20/2015  . Health care maintenance 05/30/2015  . LLQ pain 03/31/2014  . Menopausal symptoms 03/31/2014  . Hypercholesterolemia 12/31/2012  . Hypothyroidism 12/29/2012  . Osteoarthritis 12/29/2012  . PULMONARY NODULE 01/02/2008  . Depression 12/30/2007  . Essential hypertension 12/30/2007    Expected Discharge Date: Expected Discharge Date: 05/19/17  Team Members Present: Physician leading conference: Dr. Alysia Penna Social Worker Present: Ovidio Kin, LCSW Nurse Present: Rayetta Pigg, RN PT Present: Dwyane Dee, PT OT Present: Cherylynn Ridges, OT SLP Present:  Weston Anna, SLP PPS Coordinator present : Daiva Nakayama, RN, CRRN     Current Status/Progress Goal Weekly Team Focus  Medical   Severe weakness, left upper extremity starting to get some return in shoulder, right lower lobe pneumonia on IV antibiotics  Resolving pneumonia, switched to by mouth antibiotics  Continuing vanc dosing per pharmacy consult   Bowel/Bladder   Continent of B/B at this time. LBM 05/04/17 x3. Patient uses scheduled and PRN meds to move bowels. Refused Colace 5/8 HS d/t having loose stools.   Patient to move bowels at least every two days with min assist (medication).   Continue to give scheduled meds, teaching patient the purpose of stool softeners vs suppositories.    Swallow/Nutrition/ Hydration   Dys 3, thin liquids; no overt s/s of aspiration at bedside but PO intake is poor  mod I   trials of regular textures    ADL's   Mod A overall  Mod I/supervision      Mobility   mod overall  supervision/min assist   postural control, midline orientation, transfers, balance, ambulation, NMR   Communication             Safety/Cognition/ Behavioral Observations  supervision-min assist   mod I   left attention, awareness of deficits, semi-complex problem solving    Pain   Patient c/o pain to L shoulder on occasion and also c/o headaches. PRN Tylenol ordered and patient will ask for med when needed w/ pain relieved.  Patient to have pain <2 while on IPR.  Continue to monitor pain q shift and PRN.   Skin   Two R FA  IV sites w/ swelling from infiltration with Vanc on 5/7 & 5/8. No redness noted. Applying warm packs for relief. No other skin issues noted at this time.  Patient to maintain skin integrity while on IPR.  Swelling to R FA to be decreased with heat packs.  Continue to monitor R FA and skin integrity q shift and PRN.      *See Care Plan and progress notes for long and short-term goals.  Barriers to Discharge: See above, still with heavy physical assist     Possible Resolutions to Barriers:  Continue medication management as well as rehabilitation program    Discharge Planning/Teaching Needs:  HOme with three daughter's assisting. Amy-RN daughter has been here and participating in therapies with pt.      Team Discussion:  Goals supervision-min assist ambulation level, progressing more slowly than team thought would but has been ill with pneumonia. E-stim on left arm good results, no more shoulder pain. Dys 3 trials of regular. Been seen by neuro-psych for coping and support. Concerned about younger daughter and her issues. Daughter's here daily with pt.  Revisions to Treatment Plan:  DC 5/23   Continued Need for Acute Rehabilitation Level of Care: The patient requires daily medical management by a physician with specialized training in physical medicine and rehabilitation for the following conditions: Daily direction of a multidisciplinary physical rehabilitation program to ensure safe treatment while eliciting the highest outcome that is of practical value to the patient.: Yes Daily medical management of patient stability for increased activity during participation in an intensive rehabilitation regime.: Yes Daily analysis of laboratory values and/or radiology reports with any subsequent need for medication adjustment of medical intervention for : Pulmonary problems;Neurological problems  Rondell Frick, Gardiner Rhyme 05/05/2017, 1:40 PM

## 2017-05-05 NOTE — Plan of Care (Signed)
Problem: RH KNOWLEDGE DEFICIT Goal: RH STG INCREASE KNOWLEDGE OF HYPERTENSION Outcome: Progressing Pt will be able to explain management of HTN with diet and medications with cues/resources

## 2017-05-05 NOTE — Progress Notes (Signed)
Occupational Therapy Session Note  Patient Details  Name: Donna Rhodes MRN: [id number] Date of Birth: [birth year]  Today's Date: 05/05/2017 OT Individual Time: 1000-1100 OT Individual Time Calculation (min): 60 min    Short Term Goals: Week 1:  OT Short Term Goal 1 (Week 1): Pt will complete LB dressing with MOD A OT Short Term Goal 2 (Week 1): Pt will complete UB dressing using hemi-techniques OT Short Term Goal 3 (Week 1): Pt will maintain dynamic standing balance with mod A x1 in preparation for ADLa OT Short Term Goal 4 (Week 1): Pt will demonstrate self-ROM on L UE with min cues      Skilled Therapeutic Interventions/Progress Updates:    Pt declined a shower this am but did need to toilet and change clothing, so self care portion of the session focused on squat pivot transfers and midline postural control with sit to stand and static standing. Pt cued for head hip orientation for hip pivot during squat pivot to toilet and completed transfer smoothing with min A to raise and lower on to standard commode seat.  Pt demonstrated good postural control in sitting with self cleansing and reaching.  With sit to stand from toilet and from w/c to manage pants up/down hips, she could rise with min A but then needed mod-max to hold balance as she would push to her L. With cues to visually focus on her center and to lean away from where she felt the therapists hands supporting her she was then able to stand with min-mod A statically. Pt did work on pulling up her pants but needed assist to complete. To thread pants over L leg, she was able to actively cross L leg over the R.   Pt taken to the gym for LUE NMR. Transferred to the mat, L arm placed on table for A/arom exercises.  Pt demonstrated active scapular retraction and elbow flexion. Estim to facilitate wrist extension for 10 min on small muscle atrophy setting fro 10sec on, 5 off at intensity 20.  Used grasp/release facilitation with a cone and  educated pt on purpose of estim with NMR.  Pt's daughter also present.  Pt transferred back to w/c.  Her daughter took her outside for a break.    Therapy Documentation Precautions:  Precautions Precautions: Fall Precaution Comments: L hemiplegia Restrictions Weight Bearing Restrictions: No  Pain: Pain Assessment Pain Assessment: No/denies pain ADL: ADL ADL Comments: Please see functional navigator  See Function Navigator for Current Functional Status.   Therapy/Group: Individual Therapy  [place] 05/05/2017, 11:36 AM

## 2017-05-05 NOTE — Progress Notes (Signed)
Subjective/Complaints:   Slept better still is uncomfortable due to poor bed mobility  ROS-  +fever, thrush. Denies N/V/D, CP or SOB  Objective: Vital Signs: Blood pressure 118/65, pulse (!) 50, temperature 98.1 F (36.7 C), temperature source Oral, resp. rate 18, height '5\' 3"'  (1.6 m), weight 54.5 kg (120 lb 1.6 oz), last menstrual period 12/29/1981, SpO2 98 %. No results found. Results for orders placed or performed during the hospital encounter of 04/27/17 (from the past 72 hour(s))  Vancomycin, trough     Status: Abnormal   Collection Time: 05/03/17  9:30 PM  Result Value Ref Range   Vancomycin Tr 9 (L) 15 - 20 ug/mL  Basic metabolic panel     Status: Abnormal   Collection Time: 05/04/17  6:37 AM  Result Value Ref Range   Sodium 135 135 - 145 mmol/L   Potassium 3.8 3.5 - 5.1 mmol/L   Chloride 102 101 - 111 mmol/L   CO2 25 22 - 32 mmol/L   Glucose, Bld 120 (H) 65 - 99 mg/dL   BUN 15 6 - 20 mg/dL   Creatinine, Ser 0.67 0.44 - 1.00 mg/dL   Calcium 8.4 (L) 8.9 - 10.3 mg/dL   GFR calc non Af Amer >60 >60 mL/min   GFR calc Af Amer >60 >60 mL/min    Comment: (NOTE) The eGFR has been calculated using the CKD EPI equation. This calculation has not been validated in all clinical situations. eGFR's persistently <60 mL/min signify possible Chronic Kidney Disease.    Anion gap 8 5 - 15  CBC     Status: Abnormal   Collection Time: 05/04/17  6:37 AM  Result Value Ref Range   WBC 8.0 4.0 - 10.5 K/uL   RBC 3.59 (L) 3.87 - 5.11 MIL/uL   Hemoglobin 10.5 (L) 12.0 - 15.0 g/dL   HCT 33.4 (L) 36.0 - 46.0 %   MCV 93.0 78.0 - 100.0 fL   MCH 29.2 26.0 - 34.0 pg   MCHC 31.4 30.0 - 36.0 g/dL   RDW 14.4 11.5 - 15.5 %   Platelets 301 150 - 400 K/uL     HEENT: scalp abrasion left pariental with dermabond non tender.   Cardio: RRR and no JVD Resp: upper airway sounds and unlabored GI: BS positive and ND Skin:   Other see above. Warm and dary Neuro:  Alert Motor: LUE 0/5  LLE: HF, KE  3-/5, ADF/PF 0/5 Musc/Skel:  Other Left shoulder subluxation , bilateral hand MCP adn PIP chronic swelling no acute synovitis Gen NAD. Well-developed.   Assessment/Plan: 1. Functional deficits secondary to Left hemiparesis which require 3+ hours per day of interdisciplinary therapy in a comprehensive inpatient rehab setting. Physiatrist is providing close team supervision and 24 hour management of active medical problems listed below. Physiatrist and rehab team continue to assess barriers to discharge/monitor patient progress toward functional and medical goals. FIM: Function - Bathing Position: Shower (sitting on tub transfer bench) Body parts bathed by patient: Left arm, Chest, Abdomen, Front perineal area, Right upper leg, Left upper leg Body parts bathed by helper: Chest, Abdomen, Front perineal area, Buttocks, Right upper leg, Left upper leg, Left arm Bathing not applicable: Left lower leg, Back, Right lower leg, Right arm Assist Level: Touching or steadying assistance(Pt > 75%)  Function- Upper Body Dressing/Undressing What is the patient wearing?: Pull over shirt/dress Pull over shirt/dress - Perfomed by patient: Thread/unthread right sleeve, Thread/unthread left sleeve Pull over shirt/dress - Perfomed by helper: Put head through  opening, Pull shirt over trunk Assist Level: Touching or steadying assistance(Pt > 75%) Function - Lower Body Dressing/Undressing What is the patient wearing?: Underwear, Pants, Non-skid slipper socks Position: Wheelchair/chair at sink Underwear - Performed by patient: Thread/unthread right underwear leg Underwear - Performed by helper: Thread/unthread left underwear leg, Pull underwear up/down Pants- Performed by patient: Thread/unthread right pants leg Pants- Performed by helper: Thread/unthread left pants leg, Pull pants up/down, Fasten/unfasten pants Non-skid slipper socks- Performed by helper: Don/doff right sock, Don/doff left sock Assist for  footwear: Maximal assist Assist for lower body dressing:  (total assist)  Function - Toileting Toileting steps completed by patient: Performs perineal hygiene Toileting steps completed by helper: Adjust clothing prior to toileting, Adjust clothing after toileting Toileting Assistive Devices: Grab bar or rail Assist level: Two helpers  Function - Air cabin crew transfer assistive device: Bedside commode Assist level to toilet: Maximal assist (Pt 25 - 49%/lift and lower) Assist level from toilet: Maximal assist (Pt 25 - 49%/lift and lower) Assist level to bedside commode (at bedside): 2 helpers Assist level from bedside commode (at bedside): 2 helpers  Function - Chair/bed transfer Chair/bed transfer method: Stand pivot, Squat pivot Chair/bed transfer assist level: Moderate assist (Pt 50 - 74%/lift or lower) Chair/bed transfer assistive device: Armrests Chair/bed transfer details: Tactile cues for weight shifting  Function - Locomotion: Wheelchair Will patient use wheelchair at discharge?: Yes Type: Manual Max wheelchair distance: 60 Assist Level: Touching or steadying assistance (Pt > 75%) Assist Level: Touching or steadying assistance (Pt > 75%) Assist Level: Touching or steadying assistance (Pt > 75%) Turns around,maneuvers to table,bed, and toilet,negotiates 3% grade,maneuvers on rugs and over doorsills: No Function - Locomotion: Ambulation Assistive device: Rail in hallway Max distance: 30' Assist level: Maximal assist (Pt 25 - 49%) Assist level: Maximal assist (Pt 25 - 49%) (+2 for w/c follow) Walk 50 feet with 2 turns activity did not occur: Safety/medical concerns Walk 150 feet activity did not occur: Safety/medical concerns Walk 10 feet on uneven surfaces activity did not occur: Safety/medical concerns  Function - Comprehension Comprehension: Auditory Comprehension assist level: Understands basic 90% of the time/cues < 10% of the time  Function -  Expression Expression: Verbal Expression assist level: Expresses basic 75 - 89% of the time/requires cueing 10 - 24% of the time. Needs helper to occlude trach/needs to repeat words.  Function - Social Interaction Social Interaction assist level: Interacts appropriately 75 - 89% of the time - Needs redirection for appropriate language or to initiate interaction.  Function - Problem Solving Problem solving assist level: Solves basic 75 - 89% of the time/requires cueing 10 - 24% of the time  Function - Memory Memory assist level: Recognizes or recalls 75 - 89% of the time/requires cueing 10 - 24% of the time Patient normally able to recall (first 3 days only): Current season, Location of own room, That he or she is in a hospital   Medical Problem List and Plan: 1. Left hemiparesissecondary to right basal ganglia/PLIC infarct ASA and Clopidigrel  Team conference today please see physician documentation under team conference tab, met with team face-to-face to discuss problems,progress, and goals. Formulized individual treatment plan based on medical history, underlying problem and comorbidities.  WHO/PRAFO    2. DVT Prophylaxis/Anticoagulation: Pharmaceutical: Lovenox 3. Pain Management: tylenol prn 4. Mood: Seems flat--question fatigue. Team to provide ego support. LCSW to follow for evaluation and support.  -neuropsych for mood assessment 5. Neuropsych: This patient iscapable of making decisions on herown behalf.  6. Skin/Wound Care: Monitor I/O 7. Fluids/Electrolytes/Nutrition: Monitor I/O. BMET normal, enc fluids  8. HTN: Montior BP bid. On Norvasc daily   Controlled 5/9, on auscultation HR was 80 Vitals:   05/04/17 1500 05/05/17 0421  BP: 117/66 118/65  Pulse: 96 (!) 50  Resp: 20 18  Temp: 97.8 F (36.6 C) 98.1 F (36.7 C)   9. Dyslipidemia: Continue Lipitor.  10. Hypothyroid: On supplement 11. RA: Managed with methotrexate injections weekly.  12. Anorexia:  Will liberalize diet to regular to help with food choices.  13. RLL PNA  Probable recent aspiration, right lower-mid rales on exam -IS -afeb on vanc and cefipime  5/5-5/12 14.  Hypoalb- mild start prostat 15. Oral Thrush-whitish plaque on tongue but not buccal mucosa  Nystatin d/ced, diflucan ordered 5/5   LOS (Days) 8 A FACE TO FACE EVALUATION WAS PERFORMED  Brysen Shankman E 05/05/2017, 8:43 AM

## 2017-05-05 NOTE — Progress Notes (Signed)
Pt's dtr asking for methotrexate and new order received for celexa. Reviewed medications with Algis Liming as celexa is contraindicated with other meds taking and methotrexate is not indicated when treating infections as it is an immunocomprimising agent. Reviewed this information with the patient and dtr who state an understanding of information reviewed. Donna Rhodes

## 2017-05-05 NOTE — Consult Note (Signed)
Neuropsychological Consultation  Patient:  Donna Rhodes   DOB: 01/22/1946  MR Number: [id number]  Location: [facility] [id number] [place] [address] Dept: [phone] Loc: [phone]  Start: 9 AM End: 10 AM  Provider/Observer:    Ilean Skill, Psy.;D.    Chief Complaint:     Patient having a lot of adjustment issues.  Not only is she dealing with acute changes following her CVA, she is also dealing with multiple issues associated with one of her daughters' substance abuse issues.  Reason For Service:      KEARSTON PUTMAN an 71 y.o.LH female.  She presented after return from ED due to left side weakness and left facial weakness and slurred speech.  MRI showed acute infarct in right basal ganglia and posterior limb internal capsule.   Stroke felt due to small vessel disease.  CIR recommended for follow up therapy.  Interventions Strategy:  Cognitive behavioral therapy and working on coping and adjustment issues.  Participation Level:   Active  Participation Quality:  Appropriate and Attentive      Behavioral Observation:  Well Groomed, Alert, and Depressed and Tearful.   Current Psychosocial Factors: The patient is not coping well with issues outside of rehabilitation.  She has a daughter with 20+ year of substance abuse (alcohol and cocaine) that is back in an abuse cycle and job/money issues.    Content of Session:   Reviewed current symptoms and worked on therapeutic strategies and coping skills.  Current Status:   Speaking with her daughter suggests that the patient had always been very emotional and stressed by issues such as the other sister.  Daughter reports while the patient may be a little more emotional, this is fairly common.  Patient Progress:   The patient reports that she is feeling better overall and getting some movement in her foot, which has really helped  with her mood.  Impression/Diagnosis:    The patient is a 71 year old with right side basal ganglia CVA likely related to small vessel disease.  She has loss function of left side motor function and has developed adjustment issues with depressive symptoms.

## 2017-05-05 NOTE — Progress Notes (Signed)
Social Work Patient ID: Donna Rhodes, female   DOB: May 23, 1946, 71 y.o.   MRN: [id number]  Met with pt and daughter-Susan to give team conference update-supervision-min assist level goals and target discharge date 5/23. Pt feels she is not making the progress as quickly as she would like too. She has been ill with pneumonia also and hopefully once starts feeling better she will do better in her therapies. She is trying to hang in there and do what she can. Daughter reports she is concerned about her youngest daughter and her issues. Discussed she needs to focus on herself and her recovery and the other will take care of itself. Daughter confirms this with pt. Daughter's-Amy and Manuela Schwartz have Been here daily and participate in therapies with pt and provide emotional support. Will work on discharge needs and provide support to pt while here.

## 2017-05-05 NOTE — Progress Notes (Signed)
Speech Language Pathology Weekly Progress and Session Note  Patient Details  Name: Donna Rhodes MRN: [id number] Date of Birth: [birth year]  Beginning of progress report period: Apr 28, 2017 End of progress report period: May 05, 2017  Today's Date: 05/05/2017 SLP Individual Time: 1400-1500 SLP Individual Time Calculation (min): 60 min  Short Term Goals: Week 1: SLP Short Term Goal 1 (Week 1): Patient will demonstrate efficient mastication and oral clearnace with trials of regular textures without overt s/s of aspiration with Mod I.  SLP Short Term Goal 1 - Progress (Week 1): Not met SLP Short Term Goal 2 (Week 1): Patient will demonstrate complex problem solving for functional tasks with Mod I.  SLP Short Term Goal 2 - Progress (Week 1): Not met SLP Short Term Goal 3 (Week 1): Patient will attend to left field of enviornment during functional tasks with Mod I.  SLP Short Term Goal 3 - Progress (Week 1): Met    New Short Term Goals: Week 2: SLP Short Term Goal 1 (Week 2): Patient will demonstrate efficient mastication and oral clearnace with trials of regular textures without overt s/s of aspiration with Mod I.  SLP Short Term Goal 2 (Week 2): Patient will demonstrate complex problem solving for functional tasks with Mod I.   Weekly Progress Updates: Patient has made functional gains and has met 1 of 3 STG's this reporting period. Currently, patient is consuming Dys. 3 textures with thin liquids without overt s/s of aspiration and Mod I for use of swallowing compensatory strategies. Patient is consuming trials of regular textures but continues to demonstrate buccal residue which is difficult to clear, therefore, recommend patient continue trials with SLP only. Patient is Mod I for attending to left field of environment but requires supervision verbal cues for problem solving with complex tasks. Patient and family education is ongoing. Patient would benefit from continued skilled SLP  intervention to maximize her swallowing and cognitive function and overall functional independence prior to discharge.      Intensity: Minumum of 1-2 x/day, 30 to 90 minutes Frequency: 3 to 5 out of 7 days Duration/Length of Stay: 5/23 but may be shorter for SLP Treatment/Interventions: Cognitive remediation/compensation;Cueing hierarchy;Internal/external aids;Environmental controls;Functional tasks;Patient/family education;Therapeutic Activities;Dysphagia/aspiration precaution training   Daily Session  Skilled Therapeutic Interventions: Skilled treatment session focused on cognitive goals. SLP facilitated session by providing supervision verbal cues for recall of her current medications and for organization of a 2 time per day pill box. Patient left supine in bed with all needs within reach. Continue with current plan of care.       Function:    Cognition Comprehension Comprehension assist level: Understands basic 90% of the time/cues < 10% of the time  Expression   Expression assist level: Expresses basic 90% of the time/requires cueing < 10% of the time.  Social Interaction Social Interaction assist level: Interacts appropriately 75 - 89% of the time - Needs redirection for appropriate language or to initiate interaction.  Problem Solving Problem solving assist level: Solves complex 90% of the time/cues < 10% of the time  Memory Memory assist level: Recognizes or recalls 90% of the time/requires cueing < 10% of the time   Pain Pain Assessment Pain Assessment: No/denies pain  Therapy/Group: Individual Therapy  Donna Rhodes 05/05/2017, 3:12 PM

## 2017-05-06 ENCOUNTER — Inpatient Hospital Stay (HOSPITAL_COMMUNITY): Payer: PPO | Admitting: Occupational Therapy

## 2017-05-06 ENCOUNTER — Inpatient Hospital Stay (HOSPITAL_COMMUNITY): Payer: PPO | Admitting: Physical Therapy

## 2017-05-06 ENCOUNTER — Ambulatory Visit (HOSPITAL_COMMUNITY): Payer: PPO | Admitting: Physical Therapy

## 2017-05-06 ENCOUNTER — Inpatient Hospital Stay (HOSPITAL_COMMUNITY): Payer: PPO | Admitting: Speech Pathology

## 2017-05-06 LAB — VANCOMYCIN, TROUGH: Vancomycin Tr: 22 ug/mL (ref 15–20)

## 2017-05-06 MED ORDER — VANCOMYCIN HCL 500 MG IV SOLR
500.0000 mg | Freq: Three times a day (TID) | INTRAVENOUS | Status: AC
  Administered 2017-05-07 – 2017-05-08 (×5): 500 mg via INTRAVENOUS
  Filled 2017-05-06 (×8): qty 500

## 2017-05-06 NOTE — Progress Notes (Signed)
Occupational Therapy Weekly Progress Note  Patient Details  Name: Donna Rhodes MRN: [id number] Date of Birth: [birth year]    Beginning of progress report period: Apr 28, 2017 End of progress report period: May 06, 2017  Today's Date: 05/06/2017 OT Individual Time: 1100-1141 OT Individual Time Calculation (min): 41 min    Patient has met 4 of 4 short term goals. Pt is making steady progress with OT treatments at this time.  She is able to complete squat pivot transfers with consistent min A, and is progressing with stand-pivot transfers.  Pt continues to be limited in L UE function, but has regained some proximal muscle activation.   Will continue with current OT treatment POC.     Patient continues to demonstrate the following deficits: muscle weakness, abnormal tone and decreased standing balance, hemiplegia and decreased balance strategies and therefore will continue to benefit from skilled OT intervention to enhance overall performance with BADL.  Patient progressing toward long term goals..  Continue plan of care.  OT Short Term Goals Week 1:  OT Short Term Goal 1 (Week 1): Pt will complete LB dressing with MOD A OT Short Term Goal 1 - Progress (Week 1): Met OT Short Term Goal 2 (Week 1): Pt will complete UB dressing using hemi-techniques OT Short Term Goal 2 - Progress (Week 1): Met OT Short Term Goal 3 (Week 1): Pt will maintain dynamic standing balance with mod A x1 in preparation for ADLa OT Short Term Goal 3 - Progress (Week 1): Met OT Short Term Goal 4 (Week 1): Pt will demonstrate self-ROM on L UE with min cues OT Short Term Goal 4 - Progress (Week 1): Met Week 2:  OT Short Term Goal 1 (Week 2): Pt will maintain dynamic standing balance with mod A x1 in preparation for ADL OT Short Term Goal 2 (Week 2): Pt will complete UB dressing with Min A OT Short Term Goal 3 (Week 2): Pt will complete stand-pivot transfer to toilet with min A  Skilled Therapeutic  Interventions/Progress Updates:    OT treatment session focused on L UE e-stem. Pt fit for Bioness, then worked on flex/ext of hand while OT facilitated elbow/shoulder forward flexion with joint input to bring pt through full ROM. Pt returned to room at end of session and left with needs met.   Therapy Documentation Precautions:  Precautions Precautions: Fall Precaution Comments: L hemiplegia Restrictions Weight Bearing Restrictions: No Pain:   denies pain ADL: ADL ADL Comments: Please see functional navigator  See Function Navigator for Current Functional Status.   Therapy/Group: Individual Therapy  Valma Cava 05/06/2017, 7:25 PM

## 2017-05-06 NOTE — Progress Notes (Signed)
Occupational Therapy Session Note  Patient Details  Name: DYLAN RUOTOLO MRN: [id number] Date of Birth: [birth year]  Today's Date: 05/06/2017 OT Individual Time: 1100-1141 OT Individual Time Calculation (min): 41 min    Short Term Goals: Week 1:  OT Short Term Goal 1 (Week 1): Pt will complete LB dressing with MOD A OT Short Term Goal 2 (Week 1): Pt will complete UB dressing using hemi-techniques OT Short Term Goal 3 (Week 1): Pt will maintain dynamic standing balance with mod A x1 in preparation for ADLa OT Short Term Goal 4 (Week 1): Pt will demonstrate self-ROM on L UE with min cues  Skilled Therapeutic Interventions/Progress Updates:Patient preferred to complete shower and dressing and participated as follows:  w/c to/fr tub shower bench transfer with moderate assistance (decreased L LE bearing or awareness until tactile, verbally and visually cued).   UB and LB bathing and dressing with overall moderate assistance.  Sit - stand with moderate assistance and reminder to weight bear into left LE  She will benefit from more opportunities to weight bear and demonstate left lower extremeity awareness.  She was left seated in w/c beside her bed with nurisng hooking up her IV.    Call bell and phone in place upon this clinician exiting the room.     Therapy Documentation Precautions:  Precautions Precautions: Fall Precaution Comments: L hemiplegia Restrictions Weight Bearing Restrictions: No  Pain:denied    ADL ADL Comments: Please see functional navigator See Function Navigator for Current Functional Status.   Therapy/Group: Individual Therapy  [name] [facility] 05/06/2017, 11:42 AM

## 2017-05-06 NOTE — Progress Notes (Signed)
Pharmacy Antibiotic Note  Donna Rhodes is a 71 y.o. female admitted on 04/27/2017. Concern for PNA on Cefepime and vancomycin. Vancomycin trough is slightly suprtherapeutic at 22 (dose was given a little late today which might have affected this a little).  Vancomycin trough goal 15-20  Plan: 1. Continue Cefepime 1 gram IV every 12 hours  2. Change vancomycin to 500 mg iv q8h and delay next dose to midnight instead of 2200 and recheck trough as necessary 3. Monitor renal function  Height: 5\' 3"  (160 cm) Weight: 121 lb (54.9 kg) IBW/kg (Calculated) : 52.4  Temp (24hrs), Avg:98.3 F (36.8 C), Min:98.2 F (36.8 C), Max:98.4 F (36.9 C)   Recent Labs Lab 05/02/17 0511 05/03/17 2130 05/04/17 0637  WBC  --   --  8.0  CREATININE 0.76  --  0.67  VANCOTROUGH  --  9*  --     Estimated Creatinine Clearance: 53.4 mL/min (by C-G formula based on SCr of 0.67 mg/dL).    Allergies  Allergen Reactions  . Amoxicillin Other (See Comments)    Irritated tongue  . Avelox [Moxifloxacin Hcl In Nacl]   . Azithromycin Other (See Comments)    Caused mouth and tongue to be sore  . Codeine   . Estroven Weight Management [Nutritional Supplements]     Mouth soreness  . Food     Walnuts --mouth breaks out  . Moxifloxacin Other (See Comments)  . Naproxen     Antimicrobials this admission:  Cefepime 5/5 >> (5/12) Vancomycin 5/5 >> (5/12)  Dose adjustments this admission:  05/07: VT 9 >> change to 750 mg iv q8h 05/10 VT  Microbiology results:  5/5 UCx: insignificant growth  Thank you for allowing pharmacy to be a part of this patient's care.  Valicia Rief, Tsz-Yin 05/06/2017 3:00 PM

## 2017-05-06 NOTE — Progress Notes (Signed)
Physical Therapy Session Note  Patient Details  Name: Donna Rhodes MRN: [id number] Date of Birth: [birth year]  Today's Date: 05/06/2017 PT Individual Time: 9068-9340 PT Individual Time Calculation (min): 40 min   Short Term Goals: Week 2:  PT Short Term Goal 1 (Week 2): Pt will ambulate 69' with LRAD and mod assist PT Short Term Goal 2 (Week 2): Pt will propel w/c with supervision PT Short Term Goal 3 (Week 2): Pt will demo static standing balance with single UE support with min assist   Skilled Therapeutic Interventions/Progress Updates:    no c/o pain.  Session focus on LLE NMR and pre-gait activities.    Pt completed 3 trials of kinetron from w/c position for LLE activation and reciprocal stepping pattern, each trial to fatigue.  Static standing focus on midline orientation and LLE weight bearing with RLE wedged and mirror feedback with min/mod assist and mod multimodal cues for maintaining midline.  LLE NMR and pre-gait with 3# ankle weight for marching in place, and forward retro stepping with LLE focus on motor control and full weight bearing in stance.  Pt requires increasing assist across 3 trials of each exercises with increasing L pushing.  Pt also becoming more frustrated with her difficulty performing challenging tasks towards end of session.  Pt returned to room at end of session and positioned with call bell in reach and needs met.   Therapy Documentation Precautions:  Precautions Precautions: Fall Precaution Comments: L hemiplegia Restrictions Weight Bearing Restrictions: No   See Function Navigator for Current Functional Status.   Therapy/Group: Individual Therapy  [name] 05/06/2017, 4:59 PM

## 2017-05-06 NOTE — Progress Notes (Signed)
Subjective/Complaints:  Pt like new room, no new c/o   ROS-  +fever, thrush. Denies N/V/D, CP or SOB  Objective: Vital Signs: Blood pressure 113/65, pulse 77, temperature 98.2 F (36.8 C), temperature source Oral, resp. rate 18, height '5\' 3"'  (1.6 m), weight 54.9 kg (121 lb), last menstrual period 12/29/1981, SpO2 95 %. No results found. Results for orders placed or performed during the hospital encounter of 04/27/17 (from the past 72 hour(s))  Vancomycin, trough     Status: Abnormal   Collection Time: 05/03/17  9:30 PM  Result Value Ref Range   Vancomycin Tr 9 (L) 15 - 20 ug/mL  Basic metabolic panel     Status: Abnormal   Collection Time: 05/04/17  6:37 AM  Result Value Ref Range   Sodium 135 135 - 145 mmol/L   Potassium 3.8 3.5 - 5.1 mmol/L   Chloride 102 101 - 111 mmol/L   CO2 25 22 - 32 mmol/L   Glucose, Bld 120 (H) 65 - 99 mg/dL   BUN 15 6 - 20 mg/dL   Creatinine, Ser 0.67 0.44 - 1.00 mg/dL   Calcium 8.4 (L) 8.9 - 10.3 mg/dL   GFR calc non Af Amer >60 >60 mL/min   GFR calc Af Amer >60 >60 mL/min    Comment: (NOTE) The eGFR has been calculated using the CKD EPI equation. This calculation has not been validated in all clinical situations. eGFR's persistently <60 mL/min signify possible Chronic Kidney Disease.    Anion gap 8 5 - 15  CBC     Status: Abnormal   Collection Time: 05/04/17  6:37 AM  Result Value Ref Range   WBC 8.0 4.0 - 10.5 K/uL   RBC 3.59 (L) 3.87 - 5.11 MIL/uL   Hemoglobin 10.5 (L) 12.0 - 15.0 g/dL   HCT 33.4 (L) 36.0 - 46.0 %   MCV 93.0 78.0 - 100.0 fL   MCH 29.2 26.0 - 34.0 pg   MCHC 31.4 30.0 - 36.0 g/dL   RDW 14.4 11.5 - 15.5 %   Platelets 301 150 - 400 K/uL     HEENT:  Cardio: RRR and no JVD Resp: upper airway sounds and unlabored GI: BS positive and ND Skin:   Other see above. Warm and dary Neuro:  Alert Motor: LUE 0/5  LLE: HF, KE 3-/5, ADF/PF 0/5 Musc/Skel:  Other Left shoulder subluxation , bilateral hand MCP adn PIP chronic  swelling no acute synovitis Gen NAD. Well-developed.   Assessment/Plan: 1. Functional deficits secondary to Left hemiparesis which require 3+ hours per day of interdisciplinary therapy in a comprehensive inpatient rehab setting. Physiatrist is providing close team supervision and 24 hour management of active medical problems listed below. Physiatrist and rehab team continue to assess barriers to discharge/monitor patient progress toward functional and medical goals. FIM: Function - Bathing Position: Shower (sitting on tub transfer bench) Body parts bathed by patient: Left arm, Chest, Abdomen, Front perineal area, Right upper leg, Left upper leg Body parts bathed by helper: Chest, Abdomen, Front perineal area, Buttocks, Right upper leg, Left upper leg, Left arm Bathing not applicable: Left lower leg, Back, Right lower leg, Right arm Assist Level: Touching or steadying assistance(Pt > 75%)  Function- Upper Body Dressing/Undressing What is the patient wearing?: Pull over shirt/dress Pull over shirt/dress - Perfomed by patient: Thread/unthread right sleeve, Thread/unthread left sleeve, Put head through opening, Pull shirt over trunk Pull over shirt/dress - Perfomed by helper: Put head through opening, Pull shirt over trunk Assist Level:  Supervision or verbal cues Function - Lower Body Dressing/Undressing What is the patient wearing?: Underwear, Pants, Non-skid slipper socks Position: Wheelchair/chair at sink Underwear - Performed by patient: Thread/unthread right underwear leg, Thread/unthread left underwear leg Underwear - Performed by helper: Pull underwear up/down Pants- Performed by patient: Thread/unthread right pants leg, Thread/unthread left pants leg Pants- Performed by helper: Pull pants up/down Non-skid slipper socks- Performed by helper: Don/doff right sock, Don/doff left sock Assist for footwear: Maximal assist Assist for lower body dressing:  (total assist)  Function -  Toileting Toileting steps completed by patient: Adjust clothing prior to toileting, Performs perineal hygiene Toileting steps completed by helper: Adjust clothing after toileting Toileting Assistive Devices: Grab bar or rail Assist level: Touching or steadying assistance (Pt.75%), Two helpers  Function - Air cabin crew transfer assistive device: Bedside commode Assist level to toilet: Maximal assist (Pt 25 - 49%/lift and lower) (squat pivot) Assist level from toilet: Maximal assist (Pt 25 - 49%/lift and lower) Assist level to bedside commode (at bedside): 2 helpers Assist level from bedside commode (at bedside): 2 helpers  Function - Chair/bed transfer Chair/bed transfer method: Squat pivot Chair/bed transfer assist level: Touching or steadying assistance (Pt > 75%) Chair/bed transfer assistive device: Armrests Chair/bed transfer details: Tactile cues for weight shifting  Function - Locomotion: Wheelchair Will patient use wheelchair at discharge?: Yes Type: Manual Max wheelchair distance: 150 Assist Level: Supervision or verbal cues Assist Level: Supervision or verbal cues Assist Level: Supervision or verbal cues Turns around,maneuvers to table,bed, and toilet,negotiates 3% grade,maneuvers on rugs and over doorsills: No Function - Locomotion: Ambulation Assistive device: Rail in hallway Max distance: 30' Assist level: Maximal assist (Pt 25 - 49%) Assist level: Maximal assist (Pt 25 - 49%) (+2 for w/c follow) Walk 50 feet with 2 turns activity did not occur: Safety/medical concerns Walk 150 feet activity did not occur: Safety/medical concerns Walk 10 feet on uneven surfaces activity did not occur: Safety/medical concerns  Function - Comprehension Comprehension: Auditory Comprehension assist level: Understands basic 90% of the time/cues < 10% of the time  Function - Expression Expression: Verbal Expression assist level: Expresses basic 90% of the time/requires cueing  < 10% of the time.  Function - Social Interaction Social Interaction assist level: Interacts appropriately 75 - 89% of the time - Needs redirection for appropriate language or to initiate interaction.  Function - Problem Solving Problem solving assist level: Solves complex 90% of the time/cues < 10% of the time  Function - Memory Memory assist level: Recognizes or recalls 90% of the time/requires cueing < 10% of the time Patient normally able to recall (first 3 days only): Current season, Location of own room, Staff names and faces, That he or she is in a hospital   Medical Problem List and Plan: 1. 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 DVT Prophylaxis/Anticoagulation: Pharmaceutical: Lovenox 3. Pain Management: tylenol prn 4. Mood: Seems flat--question fatigue. Team to provide ego support. LCSW to follow for evaluation and support.  -neuropsych following trial SSRI 5. Neuropsych: This patient iscapable of making decisions on herown behalf.  6. Skin/Wound Care: Monitor I/O 7. Fluids/Electrolytes/Nutrition: Monitor I/O. BMET normal, enc fluids  8. HTN: Montior BP bid. On Norvasc daily   Controlled 5/10 Vitals:   05/05/17 2100 05/06/17 0605  BP: 117/65 113/65  Pulse: 71 77  Resp: 17 18  Temp:  98.2 F (36.8 C)   9. Dyslipidemia: Continue Lipitor.  10. Hypothyroid:  On supplement 11. RA: Managed with methotrexate injections weekly.  12. Anorexia: Will liberalize diet to regular to help with food choices.  13. RLL PNA  Probable recent aspiration, right lower-mid rales on exam -IS -afeb on vanc and cefepime  5/5-5/12 14.  Hypoalb- mild start prostat 15. Oral Thrush-whitish plaque on tongue but not buccal mucosa  Nystatin d/ced, diflucan ordered 5/5   LOS (Days) 9 A FACE TO FACE EVALUATION WAS PERFORMED  Neftali Abair E 05/06/2017, 7:13 AM

## 2017-05-06 NOTE — Progress Notes (Signed)
Speech Language Pathology Daily Session Note  Patient Details  Name: Donna Rhodes MRN: [id number] Date of Birth: [birth year]  Today's Date: 05/06/2017 SLP Individual Time: 0730-0830 SLP Individual Time Calculation (min): 60 min  Short Term Goals: Week 2: SLP Short Term Goal 1 (Week 2): Patient will demonstrate efficient mastication and oral clearnace with trials of regular textures without overt s/s of aspiration with Mod I.  SLP Short Term Goal 2 (Week 2): Patient will demonstrate complex problem solving for functional tasks with Mod I.   Skilled Therapeutic Interventions: Skilled treatment session focused on cognitive and dysphagia goals. SLP facilitated session by providing supervision verbal cues for problem solving during transfers and basic self-care tasks. Patient also consumed breakfast meal of Dys. 3 textures with thin liquids without overt s/s of aspiration and was Mod I to self-monitor and correct left buccal pocketing. Patient left supine in bed with all needs within reach. Continue with current plan of care.      Function:  Eating Eating   Modified Consistency Diet: Yes Eating Assist Level: More than reasonable amount of time;Set up assist for   Eating Set Up Assist For: Opening containers;Cutting food       Cognition Comprehension Comprehension assist level: Follows complex conversation/direction with extra time/assistive device  Expression   Expression assist level: Expresses complex ideas: With extra time/assistive device  Social Interaction Social Interaction assist level: Interacts appropriately with others with medication or extra time (anti-anxiety, antidepressant).  Problem Solving Problem solving assist level: Solves basic 90% of the time/requires cueing < 10% of the time  Memory Memory assist level: Recognizes or recalls 90% of the time/requires cueing < 10% of the time    Pain No/Denies Pain   Therapy/Group: Individual Therapy  Donna Rhodes,  Donna Rhodes 05/06/2017, 3:37 PM

## 2017-05-07 ENCOUNTER — Inpatient Hospital Stay (HOSPITAL_COMMUNITY): Payer: PPO | Admitting: Occupational Therapy

## 2017-05-07 ENCOUNTER — Inpatient Hospital Stay (HOSPITAL_COMMUNITY): Payer: PPO | Admitting: Speech Pathology

## 2017-05-07 ENCOUNTER — Inpatient Hospital Stay (HOSPITAL_COMMUNITY): Payer: PPO | Admitting: Physical Therapy

## 2017-05-07 MED ORDER — ALBUTEROL SULFATE (2.5 MG/3ML) 0.083% IN NEBU: 2.5000 mg | INHALATION_SOLUTION | Freq: Four times a day (QID) | RESPIRATORY_TRACT | Status: DC

## 2017-05-07 NOTE — Progress Notes (Signed)
Physical Therapy Session Note  Patient Details  Name: Donna Rhodes MRN: [id number] Date of Birth: [birth year]  Today's Date: 05/07/2017 PT Individual Time: 0900-1000 PT Individual Time Calculation (min): 60 min   Short Term Goals: Week 2:  PT Short Term Goal 1 (Week 2): Pt will ambulate 46' with LRAD and mod assist PT Short Term Goal 2 (Week 2): Pt will propel w/c with supervision PT Short Term Goal 3 (Week 2): Pt will demo static standing balance with single UE support with min assist   Skilled Therapeutic Interventions/Progress Updates:    no c/o pain, reports fatigue as she'd been awake since 5AM.  Session focus on LLE NMR, activity tolerance, and pre-gait.    Pt transitions supine<>sit with supervision and verbal cues.  Squat/pivot to R with min guard, and to L with min assist.  Pt propels w/c to therapy gym with BLEs for reciprocal stepping pattern retraining.  Pt performs supine therex on therapy mat for LLE NMR 2x15 reps of bridges with adductor hold, SLR, hip/knee flexion, and PNF 1/2 PROM>AROM>resisted AROM.  Standing with RUE support on HW NMR for weight shifting R<>L, LLE forward/retro stepping, and split stance (LLE in front) weight shift forward/back.  Pt continues to demo increased flexor synergy tone in LLE and decreased weight shift to R.  Pt returned to room at end of session to await OT session.   Therapy Documentation Precautions:  Precautions Precautions: Fall Precaution Comments: L hemiplegia Restrictions Weight Bearing Restrictions: No   See Function Navigator for Current Functional Status.   Therapy/Group: Individual Therapy  [name] 05/07/2017, 12:03 PM

## 2017-05-07 NOTE — Progress Notes (Signed)
Subjective/Complaints:  Occ cough, pt starting to move left arm   ROS-  +fever, thrush. Denies N/V/D, CP or SOB  Objective: Vital Signs: Blood pressure 109/65, pulse 68, temperature 98 F (36.7 C), temperature source Oral, resp. rate 18, height 5\' 3"  (1.6 m), weight 55 kg (121 lb 3.9 oz), last menstrual period 12/29/1981, SpO2 98 %. No results found. Results for orders placed or performed during the hospital encounter of 04/27/17 (from the past 72 hour(s))  Vancomycin, trough     Status: Abnormal   Collection Time: 05/06/17  1:45 PM  Result Value Ref Range   Vancomycin Tr 22 (HH) 15 - 20 ug/mL    Comment: CRITICAL RESULT CALLED TO, READ BACK BY AND VERIFIED WITH: KNISLEY,E RN @ [id number] 05/06/17 LEONARD,A      HEENT:  Cardio: RRR and no JVD Resp: upper airway sounds and unlabored GI: BS positive and ND Skin:   Other see above. Warm and dary Neuro:  Alert Motor: LUE 2-/5 elbow flexors, triceps LLE: HF, KE 3-/5, ADF/PF 0/5 Musc/Skel:  Other Left shoulder subluxation , bilateral hand MCP adn PIP chronic swelling no acute synovitis Gen NAD. Well-developed.   Assessment/Plan: 1. Functional deficits secondary to Left hemiparesis which require 3+ hours per day of interdisciplinary therapy in a comprehensive inpatient rehab setting. Physiatrist is providing close team supervision and 24 hour management of active medical problems listed below. Physiatrist and rehab team continue to assess barriers to discharge/monitor patient progress toward functional and medical goals. FIM: Function - Bathing Position: Shower Body parts bathed by patient: Left arm, Chest, Abdomen, Front perineal area, Right upper leg, Left upper leg Body parts bathed by helper: Right arm, Buttocks, Right lower leg, Left lower leg, Back Bathing not applicable: Left lower leg, Back, Right lower leg, Right arm Assist Level: Touching or steadying assistance(Pt > 75%)  Function- Upper Body Dressing/Undressing What is  the patient wearing?: Pull over shirt/dress Pull over shirt/dress - Perfomed by patient: Thread/unthread left sleeve, Put head through opening Pull over shirt/dress - Perfomed by helper: Thread/unthread right sleeve, Pull shirt over trunk Assist Level: Supervision or verbal cues Function - Lower Body Dressing/Undressing What is the patient wearing?: Underwear, Pants, Shoes Position: Wheelchair/chair at sink Underwear - Performed by patient: Thread/unthread right underwear leg Underwear - Performed by helper: Thread/unthread left underwear leg, Pull underwear up/down Pants- Performed by patient: Thread/unthread right pants leg Pants- Performed by helper: Thread/unthread left pants leg, Pull pants up/down, Fasten/unfasten pants Non-skid slipper socks- Performed by helper: Don/doff right sock, Don/doff left sock Shoes - Performed by patient: Don/doff right shoe Shoes - Performed by helper: Don/doff left shoe Assist for footwear: Maximal assist Assist for lower body dressing:  (total assist)  Function - Toileting Toileting steps completed by patient: Adjust clothing prior to toileting, Performs perineal hygiene Toileting steps completed by helper: Adjust clothing after toileting Toileting Assistive Devices: Grab bar or rail Assist level: Touching or steadying assistance (Pt.75%), Two helpers  Function - Air cabin crew transfer assistive device: Bedside commode Assist level to toilet: Maximal assist (Pt 25 - 49%/lift and lower) (squat pivot) Assist level from toilet: Maximal assist (Pt 25 - 49%/lift and lower) Assist level to bedside commode (at bedside): 2 helpers Assist level from bedside commode (at bedside): 2 helpers  Function - Chair/bed transfer Chair/bed transfer method: Squat pivot Chair/bed transfer assist level: Touching or steadying assistance (Pt > 75%) Chair/bed transfer assistive device: Armrests Chair/bed transfer details: Tactile cues for weight  shifting  Function - Locomotion:  Wheelchair Will patient use wheelchair at discharge?: Yes Type: Manual Max wheelchair distance: 150 Assist Level: Supervision or verbal cues Assist Level: Supervision or verbal cues Assist Level: Supervision or verbal cues Turns around,maneuvers to table,bed, and toilet,negotiates 3% grade,maneuvers on rugs and over doorsills: No Function - Locomotion: Ambulation Assistive device: Rail in hallway Max distance: 30' Assist level: Maximal assist (Pt 25 - 49%) Assist level: Maximal assist (Pt 25 - 49%) (+2 for w/c follow) Walk 50 feet with 2 turns activity did not occur: Safety/medical concerns Walk 150 feet activity did not occur: Safety/medical concerns Walk 10 feet on uneven surfaces activity did not occur: Safety/medical concerns  Function - Comprehension Comprehension: Auditory Comprehension assist level: Follows complex conversation/direction with extra time/assistive device  Function - Expression Expression: Verbal Expression assist level: Expresses complex ideas: With extra time/assistive device  Function - Social Interaction Social Interaction assist level: Interacts appropriately with others with medication or extra time (anti-anxiety, antidepressant).  Function - Problem Solving Problem solving assist level: Solves basic 90% of the time/requires cueing < 10% of the time  Function - Memory Memory assist level: Recognizes or recalls 90% of the time/requires cueing < 10% of the time Patient normally able to recall (first 3 days only): Current season, Location of own room, Staff names and faces, That he or she is in a hospital   Medical Problem List and Plan: 1. 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 DVT Prophylaxis/Anticoagulation: Pharmaceutical: Lovenox 3. Pain Management: tylenol prn 4. Mood: Seems flat--question fatigue. Team to provide ego support. LCSW to follow  for evaluation and support.  -neuropsych following trial SSRI 5. Neuropsych: This patient iscapable of making decisions on herown behalf.  6. Skin/Wound Care: Monitor I/O 7. Fluids/Electrolytes/Nutrition: Monitor I/O. BMET normal, enc fluids  8. HTN: Montior BP bid. On Norvasc daily   Controlled 5/11 Vitals:   05/06/17 1345 05/07/17 0601  BP: 122/65 109/65  Pulse: 72 68  Resp: 18 18  Temp: 98.4 F (36.9 C) 98 F (36.7 C)   9. Dyslipidemia: Continue Lipitor.  10. Hypothyroid: On supplement 11. RA: Managed with methotrexate injections weekly.  12. Anorexia: Will liberalize diet to regular to help with food choices.  13. RLL PNA  Probable recent aspiration, right lower-mid rales on exam -IS, add nebulizer -afeb on vanc and cefepime  5/5-5/12 14.  Hypoalb- mild start prostat 15. Oral Thrush-whitish plaque on tongue but not buccal mucosa  Nystatin d/ced, diflucan ordered 5/5   LOS (Days) 10 A FACE TO FACE EVALUATION WAS PERFORMED  Gamble Enderle E 05/07/2017, 7:32 AM

## 2017-05-07 NOTE — Progress Notes (Signed)
Speech Language Pathology Daily Session Note  Patient Details  Name: Donna Rhodes MRN: [id number] Date of Birth: Jan 25, 1946  Today's Date: 05/07/2017 SLP Individual Time: 1115-1200 SLP Individual Time Calculation (min): 45 min  Short Term Goals: Week 2: SLP Short Term Goal 1 (Week 2): Patient will demonstrate efficient mastication and oral clearnace with trials of regular textures without overt s/s of aspiration with Mod I.  SLP Short Term Goal 2 (Week 2): Patient will demonstrate complex problem solving for functional tasks with Mod I.   Skilled Therapeutic Interventions: Skilled treatment session focused on dysphagia goals. Upon arrival, patient supine in bed and reported she was tired. However, she was agreeable to participate with encouragement. Patient consumed trial tray of regular textures and demonstrated prolonged but efficient mastication with minimal left buccal pocketing and anterior spillage that she independently self-monitored and corrected. Recommend patient upgrade to regular textures, however, patient reported she would feel more comfortable with continued trial trays prior to upgrade. SLP agreeable. Patient became extremely emotional at end of session due to current situation, SLP provided support. Patient left supine in bed with all needs within reach. Continue with current plan of care.      Function:  Eating Eating   Modified Consistency Diet: No (with SLP) Eating Assist Level: More than reasonable amount of time;Set up assist for   Eating Set Up Assist For: Opening containers;Cutting food       Cognition Comprehension Comprehension assist level: Follows complex conversation/direction with extra time/assistive device  Expression   Expression assist level: Expresses complex ideas: With extra time/assistive device  Social Interaction Social Interaction assist level: Interacts appropriately with others with medication or extra time (anti-anxiety, antidepressant).   Problem Solving Problem solving assist level: Solves basic 90% of the time/requires cueing < 10% of the time  Memory Memory assist level: Recognizes or recalls 90% of the time/requires cueing < 10% of the time    Pain No/Denies Pain   Therapy/Group: Individual Therapy  Levert Heslop, [place] 05/07/2017, 3:17 PM

## 2017-05-07 NOTE — Progress Notes (Signed)
Occupational Therapy Session Note  Patient Details  Name: Donna Rhodes MRN: [id number] Date of Birth: Oct 07, 1946  Today's Date: 05/07/2017  Session 1 OT Individual Time: 1000-1100 OT Individual Time Calculation (min): 60 min   Session 2 OT Individual Time: 1417-1500 OT Individual Time Calculation (min): 43 min    Short Term Goals: Week 2:  OT Short Term Goal 1 (Week 2): Pt will maintain dynamic standing balance with mod A x1 in preparation for ADL OT Short Term Goal 2 (Week 2): Pt will complete UB dressing with Min A OT Short Term Goal 3 (Week 2): Pt will complete stand-pivot transfer to toilet with min A  Skilled Therapeutic Interventions/Progress Updates:   Session 1   OT treatment session focused on L NMR, LB dressing,  sit<>stand, and standing balance. Pt able to thread B LE pant legs, then required min A sit<>stand with min A. VC for L LE weight shifting and positioning. Pt able to maintain static standing intermittently with close supevrision, then would have posterior or lateral LOB requiring min-Mod A to correct when pulling up pants. L NMR standing at raised mat with friction reducing device, manual facilitation for weight bearing and push/pull.   Session 2 OT treatment session focused on e-stem and L NMR. Utilized e-stem to promote wrist extension. Then brought pt into sidelying for L NMR in gravity eliminated position. Able to break flexor synergies with min resistance. Provided joint input to bring pt through ROM shoulder flex/ext, elbow flex/ext, scap protraction/retraction.   Therapy Documentation Precautions:  Precautions Precautions: Fall Precaution Comments: L hemiplegia Restrictions Weight Bearing Restrictions: No Pain: Pain Assessment Pain Assessment: 0-10 Pain Score: 0-No pain ADL: ADL ADL Comments: Please see functional navigator  See Function Navigator for Current Functional Status.   Therapy/Group: Individual Therapy  Valma Cava 05/07/2017, 3:21 PM

## 2017-05-08 ENCOUNTER — Inpatient Hospital Stay (HOSPITAL_COMMUNITY): Payer: PPO | Admitting: Occupational Therapy

## 2017-05-08 MED ORDER — PREDNISONE 10 MG PO TABS
10.0000 mg | ORAL_TABLET | Freq: Two times a day (BID) | ORAL | Status: AC
  Administered 2017-05-08 (×2): 10 mg via ORAL
  Filled 2017-05-08 (×2): qty 1

## 2017-05-08 NOTE — Progress Notes (Signed)
Occupational Therapy Session Note  Patient Details  Name: MARC SIVERTSEN MRN: [id number] Date of Birth: [birth year]  Today's Date: 05/08/2017 OT Individual Time: 1300-1344 OT Individual Time Calculation (min): 44 min    Short Term Goals: Week 2:  OT Short Term Goal 1 (Week 2): Pt will maintain dynamic standing balance with mod A x1 in preparation for ADL OT Short Term Goal 2 (Week 2): Pt will complete UB dressing with Min A OT Short Term Goal 3 (Week 2): Pt will complete stand-pivot transfer to toilet with min A  Skilled Therapeutic Interventions/Progress Updates:    OT treatment session focused on transfer training, sit<>stand and L NMR within modified bathing/dressing tasks. Stand-pivot trasnfer bed>wc>tub bench>wc with min A stand, Mod A pivot. Incorporated L NMR into bathing with weight bearing and push/pull to wash L leg and R UE (hand-over hand to facilitate). Sit<>stand at the sink with verbal and tactile cues for weight shifting and L knee extension to improve balance. Pt required Mod A for standing balance without UE support while reaching to pull up pants. Pt left seated at sink with daughter present to fix her hair.   Therapy Documentation Precautions:  Precautions Precautions: Fall Precaution Comments: L hemiplegia Restrictions Weight Bearing Restrictions: No Pain:   denies pain ADL: ADL ADL Comments: Please see functional navigator  See Function Navigator for Current Functional Status.   Therapy/Group: Individual Therapy  Valma Cava 05/08/2017, 1:45 PM

## 2017-05-08 NOTE — Progress Notes (Signed)
Subjective/Complaints:  Occ cough, pt starting to move left arm   ROS-  +fever, thrush. Denies N/V/D, CP or SOB  Objective: Vital Signs: Blood pressure (!) 110/56, pulse 68, temperature 98.5 F (36.9 C), temperature source Oral, resp. rate 16, height 5\' 3"  (1.6 m), weight 53.5 kg (117 lb 14.4 oz), last menstrual period 12/29/1981, SpO2 95 %. No results found. Results for orders placed or performed during the hospital encounter of 04/27/17 (from the past 72 hour(s))  Vancomycin, trough     Status: Abnormal   Collection Time: 05/06/17  1:45 PM  Result Value Ref Range   Vancomycin Tr 22 (HH) 15 - 20 ug/mL    Comment: CRITICAL RESULT CALLED TO, READ BACK BY AND VERIFIED WITH: KNISLEY,E RN @ [id number] 05/06/17 LEONARD,A      HEENT:  Cardio: RRR and no JVD Resp: upper airway sounds and unlabored GI: BS positive and ND Skin:   Diffuse maculopapular rash over back and trunk/arms Neuro:  Alert Motor: LUE 2/5 elbow flexors, triceps LLE: HF, KE 3-/5, ADF/PF 0/5 Musc/Skel:  Other Left shoulder subluxation , bilateral hand MCP adn PIP chronic swelling no acute synovitis Gen NAD. Well-developed.   Assessment/Plan: 1. Functional deficits secondary to Left hemiparesis which require 3+ hours per day of interdisciplinary therapy in a comprehensive inpatient rehab setting. Physiatrist is providing close team supervision and 24 hour management of active medical problems listed below. Physiatrist and rehab team continue to assess barriers to discharge/monitor patient progress toward functional and medical goals. FIM: Function - Bathing Position: Shower Body parts bathed by patient: Left arm, Chest, Abdomen, Front perineal area, Right upper leg, Left upper leg Body parts bathed by helper: Right arm, Buttocks, Right lower leg, Left lower leg, Back Bathing not applicable: Left lower leg, Back, Right lower leg, Right arm Assist Level: Touching or steadying assistance(Pt > 75%)  Function- Upper Body  Dressing/Undressing What is the patient wearing?: Pull over shirt/dress Pull over shirt/dress - Perfomed by patient: Thread/unthread left sleeve, Put head through opening Pull over shirt/dress - Perfomed by helper: Thread/unthread right sleeve, Pull shirt over trunk Assist Level: Supervision or verbal cues Function - Lower Body Dressing/Undressing What is the patient wearing?: Underwear, Pants, Shoes Position: Wheelchair/chair at sink Underwear - Performed by patient: Thread/unthread right underwear leg Underwear - Performed by helper: Thread/unthread left underwear leg, Pull underwear up/down Pants- Performed by patient: Thread/unthread right pants leg Pants- Performed by helper: Thread/unthread left pants leg, Pull pants up/down, Fasten/unfasten pants Non-skid slipper socks- Performed by helper: Don/doff right sock, Don/doff left sock Shoes - Performed by patient: Don/doff right shoe Shoes - Performed by helper: Don/doff left shoe Assist for footwear: Maximal assist Assist for lower body dressing:  (total assist)  Function - Toileting Toileting steps completed by patient: Performs perineal hygiene Toileting steps completed by helper: Adjust clothing prior to toileting, Adjust clothing after toileting Toileting Assistive Devices: Grab bar or rail Assist level: Touching or steadying assistance (Pt.75%)  Function - Air cabin crew transfer assistive device: Bedside commode Assist level to toilet: Maximal assist (Pt 25 - 49%/lift and lower) (squat pivot) Assist level from toilet: Maximal assist (Pt 25 - 49%/lift and lower) Assist level to bedside commode (at bedside): 2 helpers Assist level from bedside commode (at bedside): 2 helpers  Function - Chair/bed transfer Chair/bed transfer method: Squat pivot Chair/bed transfer assist level: Touching or steadying assistance (Pt > 75%) Chair/bed transfer assistive device: Armrests Chair/bed transfer details: Tactile cues for weight  shifting  Function - Locomotion:  Wheelchair Will patient use wheelchair at discharge?: Yes Type: Manual Max wheelchair distance: 150 Assist Level: Supervision or verbal cues Assist Level: Supervision or verbal cues Assist Level: Supervision or verbal cues Turns around,maneuvers to table,bed, and toilet,negotiates 3% grade,maneuvers on rugs and over doorsills: No Function - Locomotion: Ambulation Assistive device: Rail in hallway Max distance: 30' Assist level: Maximal assist (Pt 25 - 49%) Assist level: Maximal assist (Pt 25 - 49%) (+2 for w/c follow) Walk 50 feet with 2 turns activity did not occur: Safety/medical concerns Walk 150 feet activity did not occur: Safety/medical concerns Walk 10 feet on uneven surfaces activity did not occur: Safety/medical concerns  Function - Comprehension Comprehension: Auditory Comprehension assist level: Follows complex conversation/direction with extra time/assistive device  Function - Expression Expression: Verbal Expression assist level: Expresses complex ideas: With extra time/assistive device  Function - Social Interaction Social Interaction assist level: Interacts appropriately with others with medication or extra time (anti-anxiety, antidepressant).  Function - Problem Solving Problem solving assist level: Solves basic 90% of the time/requires cueing < 10% of the time  Function - Memory Memory assist level: Recognizes or recalls 90% of the time/requires cueing < 10% of the time Patient normally able to recall (first 3 days only): Current season, Location of own room, Staff names and faces, That he or she is in a hospital   Medical Problem List and Plan: 1. 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 DVT Prophylaxis/Anticoagulation: Pharmaceutical: Lovenox 3. Pain Management: tylenol prn 4. Mood: Seems flat--question fatigue. Team to provide ego support. LCSW to follow  for evaluation and support.  -neuropsych following trial SSRI 5. Neuropsych: This patient iscapable of making decisions on herown behalf.  6. Skin/Wound Care: diffuse maculopapular rash---appears to be drug rash  -likely from antibiotics which end today  -provide prednisone x 2 doses 7. Fluids/Electrolytes/Nutrition: Monitor I/O. BMET normal, enc fluids  8. HTN: Montior BP bid. On Norvasc daily   Controlled 5/11 Vitals:   05/07/17 0837 05/08/17 0516  BP: 117/68 (!) 110/56  Pulse:  68  Resp:  16  Temp:  98.5 F (36.9 C)   9. Dyslipidemia: Continue Lipitor.  10. Hypothyroid: On supplement 11. RA: Managed with methotrexate injections weekly.  12. Anorexia: Will liberalize diet to regular to help with food choices.  13. RLL PNA  Probable recent aspiration, right lower-mid rales on exam -IS, on nebulizer -afeb on vanc and cefepime  5/5-5/12 14.  Hypoalb- t prostat 15. Oral Thrush-whitish plaque on tongue but not buccal mucosa  Completed diflucan   LOS (Days) 11 A FACE TO FACE EVALUATION WAS PERFORMED  Misheel Gowans T 05/08/2017, 8:24 AM

## 2017-05-09 ENCOUNTER — Inpatient Hospital Stay (HOSPITAL_COMMUNITY): Payer: PPO | Admitting: Physical Therapy

## 2017-05-09 ENCOUNTER — Inpatient Hospital Stay (HOSPITAL_COMMUNITY): Payer: PPO

## 2017-05-09 MED ORDER — HYDROCORTISONE 1 % EX CREA
TOPICAL_CREAM | Freq: Four times a day (QID) | CUTANEOUS | Status: DC | PRN
  Filled 2017-05-09: qty 28

## 2017-05-09 MED ORDER — HYDROCORTISONE 1 % EX CREA: TOPICAL_CREAM | CUTANEOUS | Status: DC | PRN

## 2017-05-09 NOTE — Progress Notes (Signed)
Subjective/Complaints:  Still notes rash. Concerned that she's getting another dose of abx (confirmed with the RN that all doses were given)  ROS: pt denies nausea, vomiting, diarrhea, cough, shortness of breath or chest pain  Objective: Vital Signs: Blood pressure 120/62, pulse 68, temperature 98.2 F (36.8 C), temperature source Oral, resp. rate 18, height 5\' 3"  (1.6 m), weight 53.5 kg (118 lb), last menstrual period 12/29/1981, SpO2 94 %. No results found. Results for orders placed or performed during the hospital encounter of 04/27/17 (from the past 72 hour(s))  Vancomycin, trough     Status: Abnormal   Collection Time: 05/06/17  1:45 PM  Result Value Ref Range   Vancomycin Tr 22 (HH) 15 - 20 ug/mL    Comment: CRITICAL RESULT CALLED TO, READ BACK BY AND VERIFIED WITH: KNISLEY,E RN @ 7106 05/06/17 LEONARD,A      HEENT:  Cardio: RRR no JVD Resp: CTA B, normal effort GI: BS positive and ND Skin:   Diffuse maculopapular rash over back and trunk/arms with LESS erythema Neuro:  Alert Motor: LUE 2/5 elbow flexors, triceps LLE: HF, KE 3-/5, ADF/PF 0/5 Musc/Skel:  Other Left shoulder subluxation , bilateral hand MCP adn PIP chronic swelling no acute synovitis Gen NAD. Well-developed.   Assessment/Plan: 1. Functional deficits secondary to Left hemiparesis which require 3+ hours per day of interdisciplinary therapy in a comprehensive inpatient rehab setting. Physiatrist is providing close team supervision and 24 hour management of active medical problems listed below. Physiatrist and rehab team continue to assess barriers to discharge/monitor patient progress toward functional and medical goals. FIM: Function - Bathing Position: Shower Body parts bathed by patient: Left arm, Chest, Abdomen, Front perineal area, Right upper leg, Left upper leg Body parts bathed by helper: Right arm, Buttocks, Right lower leg, Left lower leg, Back Bathing not applicable: Left lower leg, Back, Right  lower leg, Right arm Assist Level: Touching or steadying assistance(Pt > 75%)  Function- Upper Body Dressing/Undressing What is the patient wearing?: Pull over shirt/dress Pull over shirt/dress - Perfomed by patient: Thread/unthread left sleeve, Put head through opening Pull over shirt/dress - Perfomed by helper: Thread/unthread right sleeve, Pull shirt over trunk Assist Level: Supervision or verbal cues Function - Lower Body Dressing/Undressing What is the patient wearing?: Underwear, Pants, Shoes Position: Wheelchair/chair at sink Underwear - Performed by patient: Thread/unthread right underwear leg Underwear - Performed by helper: Thread/unthread left underwear leg, Pull underwear up/down Pants- Performed by patient: Thread/unthread right pants leg Pants- Performed by helper: Thread/unthread left pants leg, Pull pants up/down, Fasten/unfasten pants Non-skid slipper socks- Performed by helper: Don/doff right sock, Don/doff left sock Shoes - Performed by patient: Don/doff right shoe Shoes - Performed by helper: Don/doff left shoe Assist for footwear: Maximal assist Assist for lower body dressing:  (total assist)  Function - Toileting Toileting steps completed by patient: Performs perineal hygiene Toileting steps completed by helper: Adjust clothing prior to toileting, Adjust clothing after toileting Toileting Assistive Devices: Grab bar or rail Assist level: Touching or steadying assistance (Pt.75%)  Function - Air cabin crew transfer assistive device: Bedside commode Assist level to toilet: Maximal assist (Pt 25 - 49%/lift and lower) Assist level from toilet: Maximal assist (Pt 25 - 49%/lift and lower) Assist level to bedside commode (at bedside): 2 helpers Assist level from bedside commode (at bedside): 2 helpers  Function - Chair/bed transfer Chair/bed transfer method: Squat pivot Chair/bed transfer assist level: Touching or steadying assistance (Pt > 75%) Chair/bed  transfer assistive device: Armrests Chair/bed  transfer details: Tactile cues for weight shifting  Function - Locomotion: Wheelchair Will patient use wheelchair at discharge?: Yes Type: Manual Max wheelchair distance: 150 Assist Level: Supervision or verbal cues Assist Level: Supervision or verbal cues Assist Level: Supervision or verbal cues Turns around,maneuvers to table,bed, and toilet,negotiates 3% grade,maneuvers on rugs and over doorsills: No Function - Locomotion: Ambulation Assistive device: Rail in hallway Max distance: 30' Assist level: Maximal assist (Pt 25 - 49%) Assist level: Maximal assist (Pt 25 - 49%) (+2 for w/c follow) Walk 50 feet with 2 turns activity did not occur: Safety/medical concerns Walk 150 feet activity did not occur: Safety/medical concerns Walk 10 feet on uneven surfaces activity did not occur: Safety/medical concerns  Function - Comprehension Comprehension: Auditory Comprehension assist level: Follows complex conversation/direction with extra time/assistive device  Function - Expression Expression: Verbal Expression assist level: Expresses complex ideas: With extra time/assistive device  Function - Social Interaction Social Interaction assist level: Interacts appropriately with others with medication or extra time (anti-anxiety, antidepressant).  Function - Problem Solving Problem solving assist level: Solves basic 90% of the time/requires cueing < 10% of the time  Function - Memory Memory assist level: Recognizes or recalls 90% of the time/requires cueing < 10% of the time Patient normally able to recall (first 3 days only): Current season, Location of own room, Staff names and faces, That he or she is in a hospital   Medical Problem List and Plan: 1. 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 DVT Prophylaxis/Anticoagulation: Pharmaceutical: Lovenox 3. Pain Management: tylenol  prn 4. Mood: Seems flat--question fatigue. Team to provide ego support. LCSW to follow for evaluation and support.  -neuropsych following trial SSRI 5. Neuropsych: This patient iscapable of making decisions on herown behalf.  6. Skin/Wound Care: diffuse maculopapular rash---appears to be drug rash  -likely from antibiotics --appears a little better today  -provided prednisone x 2 doses  -observe for further resolution  -hydrocortisone cream prn 7. Fluids/Electrolytes/Nutrition: Monitor I/O. BMET normal, enc fluids  8. HTN: Montior BP bid. On Norvasc daily   Controlled 5/11 Vitals:   05/08/17 1447 05/09/17 0443  BP: 124/75 120/62  Pulse: 71 68  Resp: 17 18  Temp: 98 F (36.7 C) 98.2 F (36.8 C)   9. Dyslipidemia: Continue Lipitor.  10. Hypothyroid: On supplement 11. RA: Managed with methotrexate injections weekly.  12. Anorexia: Will liberalize diet to regular to help with food choices.  13. RLL PNA  Probable recent aspiration, right lower-mid rales on exam -IS, on nebulizer -afeb on vanc and cefepime  Week course completed yesterday 14.  Hypoalb- t prostat 15. Oral Thrush-whitish plaque on tongue but not buccal mucosa  Completed diflucan   LOS (Days) 12 A FACE TO FACE EVALUATION WAS PERFORMED  SWARTZ,ZACHARY T 05/09/2017, 8:14 AM

## 2017-05-09 NOTE — Progress Notes (Signed)
Physical Therapy Session Note  Patient Details  Name: Donna Rhodes MRN: [id number] Date of Birth: [birth year]  Today's Date: 05/09/2017 PT Individual Time: 1000-1100 PT Individual Time Calculation (min): 60 min   Short Term Goals: Week 1:  PT Short Term Goal 1 (Week 1): Pt will perform bedmobility consistently with min assist  PT Short Term Goal 1 - Progress (Week 1): Met PT Short Term Goal 2 (Week 1): Pt will performed bed<>WC transfers with min assist and LRAD  PT Short Term Goal 2 - Progress (Week 1): Not met PT Short Term Goal 3 (Week 1): Pt will ambulate 28f with mod assist  PT Short Term Goal 3 - Progress (Week 1): Not met PT Short Term Goal 4 (Week 1): Pt will propell WC 1020fwith min assist  PT Short Term Goal 4 - Progress (Week 1): Met PT Short Term Goal 5 (Week 1): Pt will maintain standing balance with min-mod assist for 2 minutes   PT Short Term Goal 5 - Progress (Week 1): Not met  Skilled Therapeutic Interventions/Progress Updates:  Pt was seen bedside in the am. Pt transferred supine to edge of bed with side rail,head of bed elevated and min A. Pt transferred edge of bed to w/c with min A and verbal cues. Pt propelled w/c about 150 feet with B LEs and S. Pt ambulated 30 feet x 2 with rail in hallway, max A and verbal cues. Attempted toe walk brace during second ambulation without significant improvement. Treatment in gym focused on sit to stands in parallel bars while standing worked on NMRichfieldPerformed 3 sets x 10 reps mini squats, followed by mini squats 3 sets x 10 reps each with R foot on 2" platform. ALso performed hip flex L LE only while standing 3 sets x 10 reps each. While standing also focused on uprigt posture, weight shifiting and midline. Pt returned to room following treatment. Pt transferred w/c to edge of bed with min A and verbal cues. Pt transferred edge of bed to supine with min A. Pt left sitting up in bed with call bell within reach.    Therapy Documentation Precautions:  Precautions Precautions: Fall Precaution Comments: L hemiplegia Restrictions Weight Bearing Restrictions: No General:   Pain: No c/o pain.   See Function Navigator for Current Functional Status.   Therapy/Group: Individual Therapy  MiDub Amis/13/2018, 12:40 PM

## 2017-05-09 NOTE — Progress Notes (Signed)
Occupational Therapy Session Note  Patient Details  Name: Donna Rhodes MRN: [id number] Date of Birth: 1946-10-17  Today's Date: 05/09/2017 OT Individual Time: 1650-1750 OT Individual Time Calculation (min): 60 min    Short Term Goals: Week 2:  OT Short Term Goal 1 (Week 2): Pt will maintain dynamic standing balance with mod A x1 in preparation for ADL OT Short Term Goal 2 (Week 2): Pt will complete UB dressing with Min A OT Short Term Goal 3 (Week 2): Pt will complete stand-pivot transfer to toilet with min A  Skilled Therapeutic Interventions/Progress Updates:    1:1. No pain reported. Pt agreable to bathe and dress this session. Pt stand pivot transfer EOB<>w/c<>TTB<>w/c<>toilet<>w/c<>EOB throughout this session with MIN A for balance and VC for safety awareness and hand placement throughout transfer. Pt bathes seated on TTB with HOH A of LUE to wash RUE and Vc to lean laterally to wash buttocks. While seated in chair pt applies lotion to RUE and BLE with HOHA of LUE to improve ROM and incorporate into functional task. Pt voids urine seated on toilet and leans laterally to complete hygiene. Pt dons pull over shirt with question cueing to notice donned shirt backwards for first attempt. Pt dons LB clothing at sit to stand level at sink with MOD A to advance underwear past hip fading to MIN A when standing to advance pants up hips. Educated pt on shoe button v elastic laces. Pt unlaced shoes with LUE bearing weight through shoe onto seat, and OT laced up shoes with elastic laces. Exited session with pt supine in bed with call light in reach and bed exit alarm on.   Therapy Documentation Precautions:  Precautions Precautions: Fall Precaution Comments: L hemiplegia Restrictions Weight Bearing Restrictions: No  See Function Navigator for Current Functional Status.   Therapy/Group: Individual Therapy  Tonny Branch 05/09/2017, 6:06 PM

## 2017-05-10 ENCOUNTER — Inpatient Hospital Stay (HOSPITAL_COMMUNITY): Payer: PPO | Admitting: Physical Therapy

## 2017-05-10 ENCOUNTER — Inpatient Hospital Stay (HOSPITAL_COMMUNITY): Payer: PPO | Admitting: Occupational Therapy

## 2017-05-10 ENCOUNTER — Inpatient Hospital Stay (HOSPITAL_COMMUNITY): Payer: PPO | Admitting: Speech Pathology

## 2017-05-10 NOTE — Progress Notes (Signed)
   05/10/17 1050  Clinical Encounter Type  Visited With Patient  Visit Type Spiritual support  Spiritual Encounters  Spiritual Needs Prayer  Stress Factors  Patient Stress Factors Health changes  Received page for chaplain to check on Pt. Introduction to Pt. Pt unsure about visit but asked for prayer. Offered prayer of hope and comfort.

## 2017-05-10 NOTE — Progress Notes (Signed)
Subjective/Complaints:  Per CNA rash on back and flank and groin clearing, pt denies itching  ROS: pt denies nausea, vomiting, diarrhea, cough, shortness of breath or chest pain  Objective: Vital Signs: Blood pressure 127/61, pulse 63, temperature 97.9 F (36.6 C), temperature source Oral, resp. rate 18, height 5\' 3"  (1.6 m), weight 53.3 kg (117 lb 9 oz), last menstrual period 12/29/1981, SpO2 97 %. No results found. No results found for this or any previous visit (from the past 72 hour(s)).   HEENT:  Cardio: RRR no JVD Resp: CTA B, normal effort GI: BS positive and ND Skin:   Diffuse maculopapular rash over back and trunk/arms with LESS erythema Neuro:  Alert Motor: LUE 2/5 elbow flexors, triceps LLE: HF, KE 3-/5, ADF 0/PF 2/5 RUE and RLE 5/5 Musc/Skel:  Other Left shoulder subluxation , bilateral hand MCP adn PIP chronic swelling no acute synovitis Gen NAD. Well-developed.   Assessment/Plan: 1. Functional deficits secondary to Left hemiparesis which require 3+ hours per day of interdisciplinary therapy in a comprehensive inpatient rehab setting. Physiatrist is providing close team supervision and 24 hour management of active medical problems listed below. Physiatrist and rehab team continue to assess barriers to discharge/monitor patient progress toward functional and medical goals. FIM: Function - Bathing Position: Shower Body parts bathed by patient: Left arm, Chest, Abdomen, Front perineal area, Right upper leg, Left upper leg, Buttocks, Right lower leg, Left lower leg Body parts bathed by helper: Right arm, Buttocks, Right lower leg, Left lower leg, Back Bathing not applicable: Right arm, Back Assist Level: Touching or steadying assistance(Pt > 75%)  Function- Upper Body Dressing/Undressing What is the patient wearing?: Pull over shirt/dress Pull over shirt/dress - Perfomed by patient: Thread/unthread left sleeve, Put head through opening, Thread/unthread right  sleeve, Pull shirt over trunk Pull over shirt/dress - Perfomed by helper: Thread/unthread right sleeve, Pull shirt over trunk Assist Level: Supervision or verbal cues Function - Lower Body Dressing/Undressing What is the patient wearing?: Underwear, Pants, Non-skid slipper socks Position: Wheelchair/chair at sink Underwear - Performed by patient: Thread/unthread right underwear leg, Thread/unthread left underwear leg, Pull underwear up/down Underwear - Performed by helper: Thread/unthread left underwear leg, Pull underwear up/down Pants- Performed by patient: Thread/unthread right pants leg, Thread/unthread left pants leg, Pull pants up/down Pants- Performed by helper: Thread/unthread left pants leg, Pull pants up/down, Fasten/unfasten pants Non-skid slipper socks- Performed by helper: Don/doff right sock, Don/doff left sock Shoes - Performed by patient: Don/doff right shoe Shoes - Performed by helper: Don/doff left shoe Assist for footwear: Partial/moderate assist Assist for lower body dressing: Touching or steadying assistance (Pt > 75%)  Function - Toileting Toileting steps completed by patient: Performs perineal hygiene (not wearing clothing) Toileting steps completed by helper: Adjust clothing prior to toileting, Adjust clothing after toileting Toileting Assistive Devices: Grab bar or rail Assist level: Supervision or verbal cues  Function - Air cabin crew transfer assistive device: Elevated toilet seat/BSC over toilet, Grab bar Assist level to toilet: Touching or steadying assistance (Pt > 75%) Assist level from toilet: Touching or steadying assistance (Pt > 75%) Assist level to bedside commode (at bedside): 2 helpers Assist level from bedside commode (at bedside): 2 helpers  Function - Chair/bed transfer Chair/bed transfer method: Squat pivot Chair/bed transfer assist level: Touching or steadying assistance (Pt > 75%) Chair/bed transfer assistive device:  Armrests Chair/bed transfer details: Tactile cues for weight shifting  Function - Locomotion: Wheelchair Will patient use wheelchair at discharge?: Yes Type: Manual Max wheelchair distance: 150  Assist Level: Supervision or verbal cues Assist Level: Supervision or verbal cues Assist Level: Supervision or verbal cues Turns around,maneuvers to table,bed, and toilet,negotiates 3% grade,maneuvers on rugs and over doorsills: No Function - Locomotion: Ambulation Assistive device: Rail in hallway Max distance: 30 Assist level: Maximal assist (Pt 25 - 49%) Assist level: Maximal assist (Pt 25 - 49%) Walk 50 feet with 2 turns activity did not occur: Safety/medical concerns Walk 150 feet activity did not occur: Safety/medical concerns Walk 10 feet on uneven surfaces activity did not occur: Safety/medical concerns  Function - Comprehension Comprehension: Auditory Comprehension assist level: Follows complex conversation/direction with extra time/assistive device  Function - Expression Expression: Verbal Expression assist level: Expresses complex ideas: With extra time/assistive device  Function - Social Interaction Social Interaction assist level: Interacts appropriately with others with medication or extra time (anti-anxiety, antidepressant).  Function - Problem Solving Problem solving assist level: Solves basic 90% of the time/requires cueing < 10% of the time  Function - Memory Memory assist level: Recognizes or recalls 90% of the time/requires cueing < 10% of the time Patient normally able to recall (first 3 days only): Current season, Location of own room, Staff names and faces, That he or she is in a hospital   Medical Problem List and Plan: 1. 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 DVT Prophylaxis/Anticoagulation: Pharmaceutical: Lovenox 3. Pain Management: tylenol prn 4. Mood: Seems flat--question fatigue. Team  to provide ego support. LCSW to follow for evaluation and support.  -neuropsych following trial SSRI 5. Neuropsych: This patient iscapable of making decisions on herown behalf.  6. Skin/Wound Care: diffuse maculopapular rash--improving  -likely from antibiotics --?vanc vs cefepime  -provided prednisone x 2 doses  -observe for further resolution  -hydrocortisone cream prn 7. Fluids/Electrolytes/Nutrition: Monitor I/O. BMET normal, enc fluids  8. HTN: Montior BP bid. On Norvasc daily   Controlled 5/11 Vitals:   05/09/17 1358 05/10/17 0515  BP: 128/63 127/61  Pulse: 62 63  Resp: 18 18  Temp: 98 F (36.7 C) 97.9 F (36.6 C)   9. Dyslipidemia: Continue Lipitor.  10. Hypothyroid: On supplement 11. RA: Managed with methotrexate injections weekly.  12. Anorexia: Will liberalize diet to regular to help with food choices.  13. RLL PNA resolving still some congestion but no fever  Completed vanc and cefepime-  -IS, on nebulizer  14.  Hypoalb- t prostat 1   LOS (Days) 13 A FACE TO FACE EVALUATION WAS PERFORMED  Hanad Leino E 05/10/2017, 7:40 AM

## 2017-05-10 NOTE — Progress Notes (Signed)
Occupational Therapy Session Note  Patient Details  Name: Donna Rhodes MRN: [id number] Date of Birth: 07/19/1946  Today's Date: 05/10/2017 OT Individual Time: 0930-1030 OT Individual Time Calculation (min): 60 min    Short Term Goals: Week 1:  OT Short Term Goal 1 (Week 1): Pt will complete LB dressing with MOD A OT Short Term Goal 1 - Progress (Week 1): Met OT Short Term Goal 2 (Week 1): Pt will complete UB dressing using hemi-techniques OT Short Term Goal 2 - Progress (Week 1): Met OT Short Term Goal 3 (Week 1): Pt will maintain dynamic standing balance with mod A x1 in preparation for ADLa OT Short Term Goal 3 - Progress (Week 1): Met OT Short Term Goal 4 (Week 1): Pt will demonstrate self-ROM on L UE with min cues OT Short Term Goal 4 - Progress (Week 1): Met Week 2:  OT Short Term Goal 1 (Week 2): Pt will maintain dynamic standing balance with mod A x1 in preparation for ADL OT Short Term Goal 2 (Week 2): Pt will complete UB dressing with Min A OT Short Term Goal 3 (Week 2): Pt will complete stand-pivot transfer to toilet with min A  Skilled Therapeutic Interventions/Progress Updates:    Pt received in bed stating she had received a shower with OT yesterday afternoon and did not need to take one today.  She was already dressed for the day. Pt did need to toilet.  Worked on stand pivot from bed to w/c and then w/c >< toilet using grab bar with max A due to heavy L lean and LLE weakness. During clothing management pt with heavy L lean, so clothing adjusted for her.   She completed oral care at sink.  Pt taken to gym to work on LUE NMF with tapping and A/arom to facilitate shoulder flexion and elbow flexion with table top slides.  Estim facilitation for 10 min over wrist/finger extensors at intensity 20.  Pt tolerated well but no active movement post treatment. To address postural control. Pt worked on sit to stand with R hand on support and then LUE was place on elevated tray table  for perceptual feedback for midline.  Pt cued to actively squeeze L glute and extend through L quad. She was then able to stand with contact guard A only.  Fatigued after 1 min, rested 30 sec and then repeated a second time successfully.  Pt needed to toilet again.  This time, had pt complete squat pivot with only steadying A. Swing grab bar moved so she could use her R hand on bar. Pt had much improved postural control this time with standing and adjusting her clothing over her hips.  Returned to w/c and then to bed with squat pivots with steadying A.  Pt adjusted in bed, with all needs met. Bed alarm set.   Therapy Documentation Precautions:  Precautions Precautions: Fall Precaution Comments: L hemiplegia Restrictions Weight Bearing Restrictions: No    Pain: Pain Assessment Pain Assessment: No/denies pain Pain Score: 0-No pain ADL: ADL ADL Comments: Please see functional navigator       See Function Navigator for Current Functional Status.   Therapy/Group: Individual Therapy  SAGUIER,JULIA 05/10/2017, 12:34 PM  

## 2017-05-10 NOTE — Progress Notes (Signed)
Physical Therapy Session Note  Patient Details  Name: Donna Rhodes MRN: [id number] Date of Birth: [birth year]  Today's Date: 05/10/2017 PT Individual Time: 1415-1530 PT Individual Time Calculation (min): 75 min   Short Term Goals: Week 2:  PT Short Term Goal 1 (Week 2): Pt will ambulate 5' with LRAD and mod assist PT Short Term Goal 2 (Week 2): Pt will propel w/c with supervision PT Short Term Goal 3 (Week 2): Pt will demo static standing balance with single UE support with min assist   Skilled Therapeutic Interventions/Progress Updates:    no c/o pain but reports feeling sluggish.  Session focus on midline orientation and balance during functional tasks.    Pt transitions supine<>sit with supervision using bed rails.  Sitting balance edge of bed for donning shoes/socks with steady assist for pt to don R, therapist dons L for time management.  Squat/pivot to L with mod assist to maintain balance during pivot.  Toileting with mod assist for transfer, pt completes 3/3 toileting steps with steady assist for balance with sit<>stand to manage clothing.    Pt propels w/c to and from therapy gym with BLEs for reciprocal stepping pattern retraining, coordination, and activity tolerance.    Pt completes static marching in standing with hemiwalker and up to mod assist for R weight shift.  Gait training x15' + 20' +40' with hemiwalker and overall mod assist with pt demonstrating significant improvement in weight shift and L foot contact.  Pt still requires occasional assist to prevent LLE scissoring.  Discussed use of air cast and slightly larger shoes for decreased L ankle rolling in stance.  PT instructs pt in stair negotiation 2x4 steps with R rail, rest break in between with overall mod assist.  On first attempt pt negotiates 1 step leading with RLE, 2 leading with LLE, and final step in alternating pattern, descends all 4 with step to pattern leading with L.  On second attempt pt ascends steps  with alternating pattern and mod assist with min verbal cues for power up with RLE, and descends with step to pattern.  Pt returned to room and returned to bed at end of session, call bell in reach and needs met.   Therapy Documentation Precautions:  Precautions Precautions: Fall Precaution Comments: L hemiplegia Restrictions Weight Bearing Restrictions: No   See Function Navigator for Current Functional Status.   Therapy/Group: Individual Therapy  [name] 05/10/2017, 4:39 PM

## 2017-05-10 NOTE — Progress Notes (Signed)
Speech Language Pathology Daily Session Note  Patient Details  Name: Donna Rhodes MRN: [id number] Date of Birth: [birth year]  Today's Date: 05/10/2017 SLP Individual Time: 1100-1155 SLP Individual Time Calculation (min): 55 min  Short Term Goals: Week 2: SLP Short Term Goal 1 (Week 2): Patient will demonstrate efficient mastication and oral clearnace with trials of regular textures without overt s/s of aspiration with Mod I.  SLP Short Term Goal 2 (Week 2): Patient will demonstrate complex problem solving for functional tasks with Mod I.   Skilled Therapeutic Interventions: Skilled treatment session focused on dysphagia goals. Upon arrival, patient supine in bed and reported she was tired. However, she was agreeable to participate with encouragement. Patient transferred to the wheelchair to maximize arousal and safety. Patient consumed trial tray of regular textures and demonstrated prolonged but efficient mastication with minimal left buccal pocketing and anterior spillage that she self-monitored and corrected with supervision verbal cues. Recommend one more trial tray prior to upgrade, patient agreeable.  Patient performed oral care at sink with set-up and was transferred back to bed. Patient left supine in bed with all needs within reach. Continue with current plan of care.      Function:  Eating Eating   Modified Consistency Diet: No Eating Assist Level: More than reasonable amount of time;Set up assist for   Eating Set Up Assist For: Opening containers;Cutting food       Cognition Comprehension Comprehension assist level: Follows complex conversation/direction with extra time/assistive device  Expression   Expression assist level: Expresses complex ideas: With extra time/assistive device  Social Interaction Social Interaction assist level: Interacts appropriately with others with medication or extra time (anti-anxiety, antidepressant).  Problem Solving Problem solving assist  level: Solves basic 90% of the time/requires cueing < 10% of the time  Memory Memory assist level: Recognizes or recalls 90% of the time/requires cueing < 10% of the time    Pain Pain Assessment Pain Assessment: No/denies pain  Therapy/Group: Individual Therapy  Colonel Krauser, [place] 05/10/2017, 3:13 PM

## 2017-05-11 ENCOUNTER — Inpatient Hospital Stay (HOSPITAL_COMMUNITY): Payer: PPO | Admitting: Physical Therapy

## 2017-05-11 ENCOUNTER — Inpatient Hospital Stay (HOSPITAL_COMMUNITY): Payer: PPO | Admitting: Speech Pathology

## 2017-05-11 ENCOUNTER — Inpatient Hospital Stay (HOSPITAL_COMMUNITY): Payer: PPO | Admitting: Occupational Therapy

## 2017-05-11 DIAGNOSIS — K210 Gastro-esophageal reflux disease with esophagitis: Secondary | ICD-10-CM

## 2017-05-11 LAB — CBC
HCT: 35.9 % — ABNORMAL LOW (ref 36.0–46.0)
Hemoglobin: 11.4 g/dL — ABNORMAL LOW (ref 12.0–15.0)
MCH: 30.3 pg (ref 26.0–34.0)
MCHC: 31.8 g/dL (ref 30.0–36.0)
MCV: 95.5 fL (ref 78.0–100.0)
PLATELETS: 471 10*3/uL — AB (ref 150–400)
RBC: 3.76 MIL/uL — ABNORMAL LOW (ref 3.87–5.11)
RDW: 14.5 % (ref 11.5–15.5)
WBC: 10.2 10*3/uL (ref 4.0–10.5)

## 2017-05-11 LAB — BASIC METABOLIC PANEL
Anion gap: 8 (ref 5–15)
BUN: 17 mg/dL (ref 6–20)
CALCIUM: 9 mg/dL (ref 8.9–10.3)
CO2: 27 mmol/L (ref 22–32)
CREATININE: 0.61 mg/dL (ref 0.44–1.00)
Chloride: 105 mmol/L (ref 101–111)
GFR calc Af Amer: >60 mL/min (ref >60)
GFR calc non Af Amer: >60 mL/min (ref >60)
GLUCOSE: 93 mg/dL (ref 65–99)
Potassium: 3.8 mmol/L (ref 3.5–5.1)
Sodium: 140 mmol/L (ref 135–145)

## 2017-05-11 NOTE — Progress Notes (Addendum)
Occupational Therapy Session Note  Patient Details  Name: Donna Rhodes MRN: [id number] Date of Birth: Jun 15, 1946  Today's Date: 05/11/2017 OT Individual Time: 1002-1100 OT Individual Time Calculation (min): 58 min   Short Term Goals: Week 2:  OT Short Term Goal 1 (Week 2): Pt will maintain dynamic standing balance with mod A x1 in preparation for ADL OT Short Term Goal 2 (Week 2): Pt will complete UB dressing with Min A OT Short Term Goal 3 (Week 2): Pt will complete stand-pivot transfer to toilet with min A  Skilled Therapeutic Interventions/Progress Updates:    OT treatment session focused on functional transfer training. Self ROM, and functional use of L UE. Pt reported need for bathroom, transferred sup<>sit w/ supervision, stand-pivot to toilet with Mod A to pivot. Pt with successful BM and set-up for toileting. Pt transferred into walk-in shower in similar fashion. Provided pt with wash mit for L UE. Incorporated NMR weight bearing techniques during bathing tasks. Pt able to push/pull from shoulder and utilized hand-over hand assist and guided techniques to incorporate L UE within bathing. OT educated pt on functional use of L UE as a stabilizer. Pt able to place toothbrush into L hand to stabilize it as she squeezed toothrbush today. Pt then completed UB and LB dressing with min A overall and supervision with intermittent min A for standing balance when pulling up pants. Pt left seated in wc at end of session with needs met.   Therapy Documentation Precautions:  Precautions Precautions: Fall Precaution Comments: L hemiplegia Restrictions Weight Bearing Restrictions: No Pain:  denies pain ADL: ADL ADL Comments: Please see functional navigator  See Function Navigator for Current Functional Status.  Therapy/Group: Individual Therapy  Valma Cava 05/11/2017, 10:52 AM

## 2017-05-11 NOTE — Progress Notes (Signed)
Subjective/Complaints: Some nausea, wonders if it was the ensure, loose stool x 1 last noc   ROS: pt denies nausea, vomiting, diarrhea, cough, shortness of breath or chest pain  Objective: Vital Signs: Blood pressure 124/63, pulse 64, temperature 98.3 F (36.8 C), temperature source Oral, resp. rate 18, height 5\' 3"  (1.6 m), weight 53.3 kg (117 lb 6.5 oz), last menstrual period 12/29/1981, SpO2 95 %. No results found. No results found for this or any previous visit (from the past 72 hour(s)).   HEENT:  Cardio: RRR no JVD Resp: CTA B, normal effort GI: BS positive and ND Skin:   Diffuse maculopapular rash over back and trunk/arms with LESS erythema Neuro:  Alert Motor: LUE 2/5 elbow flexors, triceps LLE: HF, KE 3-/5, ADF 0/PF 2/5 RUE and RLE 5/5 Musc/Skel:  Other Left shoulder subluxation , bilateral hand MCP adn PIP chronic swelling no acute synovitis Gen NAD. Well-developed.   Assessment/Plan: 1. Functional deficits secondary to Left hemiparesis which require 3+ hours per day of interdisciplinary therapy in a comprehensive inpatient rehab setting. Physiatrist is providing close team supervision and 24 hour management of active medical problems listed below. Physiatrist and rehab team continue to assess barriers to discharge/monitor patient progress toward functional and medical goals. FIM: Function - Bathing Position: Shower Body parts bathed by patient: Left arm, Chest, Abdomen, Front perineal area, Right upper leg, Left upper leg, Buttocks, Right lower leg, Left lower leg Body parts bathed by helper: Right arm, Buttocks, Right lower leg, Left lower leg, Back Bathing not applicable: Right arm, Back Assist Level: Touching or steadying assistance(Pt > 75%)  Function- Upper Body Dressing/Undressing What is the patient wearing?: Pull over shirt/dress Pull over shirt/dress - Perfomed by patient: Thread/unthread left sleeve, Put head through opening, Thread/unthread right  sleeve, Pull shirt over trunk Pull over shirt/dress - Perfomed by helper: Thread/unthread right sleeve, Pull shirt over trunk Assist Level: Supervision or verbal cues Function - Lower Body Dressing/Undressing What is the patient wearing?: Underwear, Pants, Non-skid slipper socks Position: Wheelchair/chair at sink Underwear - Performed by patient: Thread/unthread right underwear leg, Thread/unthread left underwear leg, Pull underwear up/down Underwear - Performed by helper: Thread/unthread left underwear leg, Pull underwear up/down Pants- Performed by patient: Thread/unthread right pants leg, Thread/unthread left pants leg, Pull pants up/down Pants- Performed by helper: Thread/unthread left pants leg, Pull pants up/down, Fasten/unfasten pants Non-skid slipper socks- Performed by helper: Don/doff right sock, Don/doff left sock Shoes - Performed by patient: Don/doff right shoe Shoes - Performed by helper: Don/doff left shoe Assist for footwear: Partial/moderate assist Assist for lower body dressing: Touching or steadying assistance (Pt > 75%)  Function - Toileting Toileting steps completed by patient: Adjust clothing prior to toileting, Performs perineal hygiene, Adjust clothing after toileting Toileting steps completed by helper: Adjust clothing after toileting Toileting Assistive Devices: Grab bar or rail Assist level: Touching or steadying assistance (Pt.75%)  Function - Air cabin crew transfer assistive device: Grab bar Assist level to toilet: Moderate assist (Pt 50 - 74%/lift or lower) Assist level from toilet: Moderate assist (Pt 50 - 74%/lift or lower) Assist level to bedside commode (at bedside): 2 helpers Assist level from bedside commode (at bedside): 2 helpers  Function - Chair/bed transfer Chair/bed transfer method: Stand pivot Chair/bed transfer assist level: Moderate assist (Pt 50 - 74%/lift or lower) Chair/bed transfer assistive device: Armrests Chair/bed  transfer details: Tactile cues for weight shifting  Function - Locomotion: Wheelchair Will patient use wheelchair at discharge?: Yes Type: Manual Max wheelchair distance:  150 Assist Level: Supervision or verbal cues Assist Level: Supervision or verbal cues Assist Level: Supervision or verbal cues Turns around,maneuvers to table,bed, and toilet,negotiates 3% grade,maneuvers on rugs and over doorsills: No Function - Locomotion: Ambulation Assistive device: Walker-hemi Max distance: 40 Assist level: Moderate assist (Pt 50 - 74%) Assist level: Moderate assist (Pt 50 - 74%) Walk 50 feet with 2 turns activity did not occur: Safety/medical concerns Walk 150 feet activity did not occur: Safety/medical concerns Walk 10 feet on uneven surfaces activity did not occur: Safety/medical concerns  Function - Comprehension Comprehension: Auditory Comprehension assist level: Follows complex conversation/direction with extra time/assistive device  Function - Expression Expression: Verbal Expression assist level: Expresses complex ideas: With extra time/assistive device  Function - Social Interaction Social Interaction assist level: Interacts appropriately with others with medication or extra time (anti-anxiety, antidepressant).  Function - Problem Solving Problem solving assist level: Solves complex 90% of the time/cues < 10% of the time  Function - Memory Memory assist level: Recognizes or recalls 90% of the time/requires cueing < 10% of the time Patient normally able to recall (first 3 days only): Current season, Location of own room, Staff names and faces, That he or she is in a hospital   Medical Problem List and Plan: 1. 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 DVT Prophylaxis/Anticoagulation: Pharmaceutical: Lovenox 3. Pain Management: tylenol prn 4. Mood: Seems flat--question fatigue. Team to provide ego support. LCSW to  follow for evaluation and support.  -neuropsych following trial SSRI, celexa 10mg  5. Neuropsych: This patient iscapable of making decisions on herown behalf.  6. Skin/Wound Care: diffuse maculopapular rash--improving  -likely from antibiotics --?vanc vs cefepime  -provided prednisone x 2 doses  -observe for further resolution  -hydrocortisone cream prn 7. Fluids/Electrolytes/Nutrition: Monitor I/O. BMET normal, enc fluids  8. HTN: Montior BP bid. On Norvasc daily   Controlled 5/12 Vitals:   05/10/17 1537 05/11/17 0450  BP: 118/61 124/63  Pulse: 69 64  Resp: 18 18  Temp: 98.2 F (36.8 C) 98.3 F (36.8 C)   9. Dyslipidemia: Continue Lipitor.  10. Hypothyroid: On supplement 11. RA: Managed with methotrexate injections weekly.  12. Anorexia: Will liberalize diet to regular to help with food choices.  13. RLL PNA resolving still some congestion but no fever  Completed vanc and cefepime-  -IS,pt will need to work on this more on nebulizer  14.  Hypoalb- t prostat 15.  Nausea may be related to Celxa vs depression , some loose stool last noc but has not had multiple stools , cont prn compazine   LOS (Days) 14 A FACE TO FACE EVALUATION WAS PERFORMED  Donna Rhodes E 05/11/2017, 7:15 AM

## 2017-05-11 NOTE — Progress Notes (Signed)
Orthopedic Tech Progress Note Patient Details:  Donna Rhodes 23-Dec-1946 [id number]  Ortho Devices Type of Ortho Device: Ankle Air splint Ortho Device/Splint Interventions: Application   [name] 05/11/2017, 6:02 PM

## 2017-05-11 NOTE — Progress Notes (Signed)
Physical Therapy Session Note  Patient Details  Name: Donna Rhodes MRN: [id number] Date of Birth: 1946-07-03  Today's Date: 05/11/2017 PT Individual Time: 1415-1530 PT Individual Time Calculation (min): 75 min   Short Term Goals: Week 2:  PT Short Term Goal 1 (Week 2): Pt will ambulate 67' with LRAD and mod assist PT Short Term Goal 2 (Week 2): Pt will propel w/c with supervision PT Short Term Goal 3 (Week 2): Pt will demo static standing balance with single UE support with min assist   Skilled Therapeutic Interventions/Progress Updates:    no c/o pain but reports ongoing fatigue and "sluggish"ness.  Session focus on gait training and NMR for LLE.   Pt taken to therapy gym for time management.  Gait training throughout gym, max distance 50', first with hemiwalker and min/mod assist, progress to [facility] with mod assist.  Pt demos improved LLE control, improved weight shift R, and improved upright posture overall, however with fatigue continues to demonstrate some LLE flexor synergy tone and scissoring requiring seated rest break to recover.    NMR sitting and standing on kinetron at 35-55 cm/s focus on posture, LLE strengthening, midline and activity tolerance.  Pt completes 2 rounds in standing and 2 rounds in sitting.  Pt returned to room at end of session with BLEs propelling w/c. Steady assist for transfer to commode and pt left to await NT.    Therapy Documentation Precautions:  Precautions Precautions: Fall Precaution Comments: L hemiplegia Restrictions Weight Bearing Restrictions: No   See Function Navigator for Current Functional Status.   Therapy/Group: Individual Therapy  Donna Rhodes 05/11/2017, 3:34 PM

## 2017-05-11 NOTE — Progress Notes (Signed)
Speech Language Pathology Daily Session Notes  Patient Details  Name: Donna Rhodes MRN: [id number] Date of Birth: October 07, 1946  Today's Date: 05/11/2017  Session 1: SLP Individual Time: 0938-1829 SLP Individual Time Calculation (min): 25 min  Session 2: SLP Individual Time: 1200-1230 SLP Individual Time Calculation (min): 30 min  Short Term Goals: Week 2: SLP Short Term Goal 1 (Week 2): Patient will demonstrate efficient mastication and oral clearnace with trials of regular textures without overt s/s of aspiration with Mod I.  SLP Short Term Goal 2 (Week 2): Patient will demonstrate complex problem solving for functional tasks with Mod I.   Skilled Therapeutic Interventions:  Session 1: Skilled treatment session focused on cognitive goals. Upon arrival, patient was sitting upright on EOB and consumed medications whole with liquids without overt s/s of aspiration and recalled functions of medications with intermittent supervision verbal cues. Patient reported she was looking forward to eating a salad with clinician at lunch. Therefore, patient utilized the menu and chose appropriate menu options and called in options over the phone with supervision verbal cues for recall of number. Patient left supine in bed with all needs within reach. Continue with current plan of care.   Session 2: Skilled treatment session focused on dysphagia goals. Patient consumed trial tray of regular textures and demonstrated prolonged but efficient mastication with minimal left buccal pocketing and anterior spillage that she self-monitored and corrected with supervision verbal cues without overt s/s of aspiration. Recommend patient upgrade to regular textures. Patient left upright in wheelchair with all needs within reach. Continue with current plan of care.   Function:  Eating Eating   Modified Consistency Diet: No Eating Assist Level: More than reasonable amount of time;Set up assist for   Eating Set Up  Assist For: Opening containers       Cognition Comprehension Comprehension assist level: Follows complex conversation/direction with extra time/assistive device  Expression  Expression assist level: Expresses complex ideas: With extra time/assistive device  Social Interaction Social Interaction assist level: Interacts appropriately with others with medication or extra time (anti-anxiety, antidepressant).  Problem Solving Problem solving assist level: Solves complex 90% of the time/cues < 10% of the time  Memory Memory assist level: Recognizes or recalls 90% of the time/requires cueing < 10% of the time    Pain No/Denies Pain   Therapy/Group: Individual Therapy  Icie Kuznicki 05/11/2017, 3:29 PM

## 2017-05-12 ENCOUNTER — Inpatient Hospital Stay (HOSPITAL_COMMUNITY): Payer: PPO | Admitting: Speech Pathology

## 2017-05-12 ENCOUNTER — Inpatient Hospital Stay (HOSPITAL_COMMUNITY): Payer: PPO | Admitting: Occupational Therapy

## 2017-05-12 ENCOUNTER — Inpatient Hospital Stay (HOSPITAL_COMMUNITY): Payer: PPO | Admitting: Physical Therapy

## 2017-05-12 MED ORDER — METHYLPREDNISOLONE 4 MG PO TBPK
4.0000 mg | ORAL_TABLET | ORAL | Status: AC
  Administered 2017-05-12: 4 mg via ORAL

## 2017-05-12 MED ORDER — METHYLPREDNISOLONE 4 MG PO TBPK
8.0000 mg | ORAL_TABLET | Freq: Every evening | ORAL | Status: AC
  Administered 2017-05-13: 8 mg via ORAL

## 2017-05-12 MED ORDER — METHYLPREDNISOLONE 4 MG PO TBPK
8.0000 mg | ORAL_TABLET | Freq: Every morning | ORAL | Status: AC
  Administered 2017-05-12: 8 mg via ORAL
  Filled 2017-05-12: qty 21

## 2017-05-12 MED ORDER — METHYLPREDNISOLONE 4 MG PO TBPK
8.0000 mg | ORAL_TABLET | Freq: Every evening | ORAL | Status: AC
  Administered 2017-05-12: 8 mg via ORAL

## 2017-05-12 MED ORDER — METHOTREXATE INJECTION FOR WOMEN'S HOSPITAL: 12.5000 mg | INTRAMUSCULAR | Status: DC

## 2017-05-12 MED ORDER — METHOTREXATE SODIUM CHEMO INJECTION 50 MG/2ML
15.0000 mg | INTRAMUSCULAR | Status: DC
  Administered 2017-05-12: 15 mg via SUBCUTANEOUS
  Filled 2017-05-12: qty 0.6

## 2017-05-12 MED ORDER — METHOTREXATE SODIUM CHEMO INJECTION 50 MG/2ML
15.0000 mg | INTRAMUSCULAR | Status: DC
  Filled 2017-05-12: qty 0.6

## 2017-05-12 MED ORDER — METHYLPREDNISOLONE 4 MG PO TBPK
4.0000 mg | ORAL_TABLET | Freq: Three times a day (TID) | ORAL | Status: AC
  Administered 2017-05-13 (×3): 4 mg via ORAL

## 2017-05-12 MED ORDER — METHYLPREDNISOLONE 4 MG PO TBPK
4.0000 mg | ORAL_TABLET | Freq: Four times a day (QID) | ORAL | Status: AC
  Administered 2017-05-14 – 2017-05-17 (×10): 4 mg via ORAL

## 2017-05-12 MED ORDER — METHOTREXATE INJECTION FOR WOMEN'S HOSPITAL
15.0000 mg | INTRAMUSCULAR | Status: DC
  Filled 2017-05-12 (×2): qty 0.3

## 2017-05-12 NOTE — Progress Notes (Addendum)
Recd call from [facility] with IV team to administer chemo tx.  Said that needs signed consent that doctor has reviewed with pt and can not accept consent from cancer tx center unless less than 37 days old.Notified MD. Talked to MD, will see if PA can go over with pt.

## 2017-05-12 NOTE — Progress Notes (Signed)
Speech Language Pathology Session Note & Discharge Summary  Patient Details  Name: Donna Rhodes MRN: [id number] Date of Birth: 1946-05-11  Today's Date: 05/12/2017 SLP Individual Time: 0930-1010 SLP Individual Time Calculation (min): 40 min   Skilled Therapeutic Interventions:  Skilled treatment session focused on cognitive goals. SLP facilitated session by re-administering the MoCA-BLIND in which patient scored 21/22 points with a score of 18 or above considered normal. Suspect patient is at her cognitive baseline, therefore, all cognitive goals have been met at this time. Patient has also met all of her dysphagia goals at this time, therefore, recommend patient discharge from skilled SLP intervention. She verbalized understanding and agreement. Patient left upright in bed with all needs within reach.     Patient has met 3 of 3 long term goals.  Patient to discharge at overall Modified Independent level.  Reasons goals not met: N/A   Clinical Impression/Discharge Summary: Patient has made excellent gains and has met 3 of 3 LTG's this admission. Currently, patient is consuming regular textures with thin liquids without overt s/s of aspiration and is Mod I for use of swallowing compensatory strategies. Patient is also Mod I for recall of functional information and complex problem solving. Suspect patient is at her cognitive and swallowing baseline, therefore, she will be discharged from skilled SLP intervention. All education is complete and she verbalized understanding and agreement.   Recommendation:  None      Equipment: N/A   Reasons for discharge: Treatment goals met   Patient/Family Agrees with Progress Made and Goals Achieved: Yes   Function:   Cognition Comprehension Comprehension assist level: Follows complex conversation/direction with extra time/assistive device  Expression   Expression assist level: Expresses complex ideas: With extra time/assistive device  Social  Interaction Social Interaction assist level: Interacts appropriately 90% of the time - Needs monitoring or encouragement for participation or interaction.  Problem Solving Problem solving assist level: Solves complex problems: With extra time  Memory Memory assist level: More than reasonable amount of time   Christna Kulick 05/12/2017, 3:14 PM

## 2017-05-12 NOTE — Progress Notes (Deleted)
The nurse caring for this patient Santiago Glad RN) will adminster the methotrexate. Catalina Pizza

## 2017-05-12 NOTE — Progress Notes (Signed)
Occupational Therapy Session Note  Patient Details  Name: Donna Rhodes MRN: [id number] Date of Birth: September 08, 1946  Today's Date: 05/12/2017 OT Individual Time: 1400-1430 OT Individual Time Calculation (min): 30 min    Short Term Goals: Week 1:  OT Short Term Goal 1 (Week 1): Pt will complete LB dressing with MOD A OT Short Term Goal 1 - Progress (Week 1): Met OT Short Term Goal 2 (Week 1): Pt will complete UB dressing using hemi-techniques OT Short Term Goal 2 - Progress (Week 1): Met OT Short Term Goal 3 (Week 1): Pt will maintain dynamic standing balance with mod A x1 in preparation for ADLa OT Short Term Goal 3 - Progress (Week 1): Met OT Short Term Goal 4 (Week 1): Pt will demonstrate self-ROM on L UE with min cues OT Short Term Goal 4 - Progress (Week 1): Met Week 2:  OT Short Term Goal 1 (Week 2): Pt will maintain dynamic standing balance with mod A x1 in preparation for ADL OT Short Term Goal 2 (Week 2): Pt will complete UB dressing with Min A OT Short Term Goal 3 (Week 2): Pt will complete stand-pivot transfer to toilet with min A  Skilled Therapeutic Interventions/Progress Updates:    1:1 Applied air cast and pt transferred to w/c stand pivot with min A. Sitting unsupported in the w/c focus on activation of left Ue in supported position. Pt able to demonstrate shoulder flexion/extension, elbow flexion/extension and supination and pronation demonstrated with gravity eliminated (therapist supporting UE) and in weight bearing. Pt tearful with ability to move UE today. By end of session pt able to move UE on and off of her lap without assistance (a lot of shoulder compensation but able to initiate movement throughout UE except for hand/ wrist). Left in w/c performing her breathing exercises.   Therapy Documentation Precautions:  Precautions Precautions: Fall Precaution Comments: L hemiplegia Restrictions Weight Bearing Restrictions: No Pain:  no c/o pain  ADL: ADL ADL  Comments: Please see functional navigator  See Function Navigator for Current Functional Status.   Therapy/Group: Individual Therapy  Willeen Cass [facility] 05/12/2017, 3:10 PM

## 2017-05-12 NOTE — Progress Notes (Signed)
Occupational Therapy Session Note  Patient Details  Name: Donna Rhodes MRN: [id number] Date of Birth: [birth year]  Today's Date: 05/12/2017 OT Individual Time: 0800-0859 OT Individual Time Calculation (min): 59 min    Short Term Goals: Week 2:  OT Short Term Goal 1 (Week 2): Pt will maintain dynamic standing balance with mod A x1 in preparation for ADL OT Short Term Goal 2 (Week 2): Pt will complete UB dressing with Min A OT Short Term Goal 3 (Week 2): Pt will complete stand-pivot transfer to toilet with min A  Skilled Therapeutic Interventions/Progress Updates:    OT treatment session focused on LB dressing, transfer training, standing balance, and L NMR. OT donned L air cast to provide LLE stability with standing and transfers. Stand-pivot transfers with Min A using quad cane to toilet, voided bladder, set-up peri-care. Addressed standing balance at sink to wash hands with close supervision and cues to extend L knee. Pt then brought to tub room and practiced tub shower transfer with overall min A when pivot to transfer onto bench. L NMR is standing + e-stem. Focused on weight bearing and shoulder/elbow/wrist flex/ext. Returned to bed at end of session stand-pivot Min A.  Therapy Documentation Precautions:  Precautions Precautions: Fall Precaution Comments: L hemiplegia Restrictions Weight Bearing Restrictions: No General:   Vital Signs: Therapy Vitals Temp: 98.3 F (36.8 C) Temp Source: Oral Pulse Rate: 81 Resp: 18 BP: 131/63 Patient Position (if appropriate): Lying Oxygen Therapy SpO2: 96 % O2 Device: Not Delivered Pain:  none/denies pain ADL: ADL ADL Comments: Please see functional navigator \ See Function Navigator for Current Functional Status.   Therapy/Group: Individual Therapy  Valma Cava 05/12/2017, 9:04 AM

## 2017-05-12 NOTE — Progress Notes (Signed)
Physical Therapy Session Note  Patient Details  Name: Donna Rhodes MRN: [id number] Date of Birth: [birth year]  Today's Date: 05/12/2017 PT Individual Time: 1515-1600 PT Individual Time Calculation (min): 45 min   Short Term Goals: Week 2:  PT Short Term Goal 1 (Week 2): Pt will ambulate 31' with LRAD and mod assist PT Short Term Goal 2 (Week 2): Pt will propel w/c with supervision PT Short Term Goal 3 (Week 2): Pt will demo static standing balance with single UE support with min assist   Skilled Therapeutic Interventions/Progress Updates:    no c/o pain.  Session focus on NMR and strengthening for LLE with functional ambulation and therex.    Pt ambulates x60' in therapy gym with [facility] and consistent min assist with mod multimodal cues for placement of LLE and upright posture, especially during turning.  During first ambulation trial pt demos improved varus moment with L air cast, but continues to demonstrate lateral instability at knee/hip.  During seated rest break pt completes 2x15 reps of hip abduction with level 2 theraband, hip adduction towel roll squeeze, and LAQ with 2 second hold.  Pt requires mod cues with therex for correct form and pacing.  Gait training x60' with [facility] and min assist, more improvement in varus moment with improved lateral stability in LLE overall.  Pt's RN in during this session and requesting pt return to room so IV team can administer a medication and states that they are waiting on her.  Returned pt to room in w/c, assisted to bathroom with ambulatory transfer using [facility].  Pt completes 3/3 toileting steps and returns to bed at end of session with up to mod assist with ambulatory approach to bed 2/2 distractions in the room. Left EOB in care of nursing staff.   Therapy Documentation Precautions:  Precautions Precautions: Fall Precaution Comments: L hemiplegia Restrictions Weight Bearing Restrictions: No General: PT Amount of Missed Time (min): 30  Minutes PT Missed Treatment Reason: Nursing care (pt's RN states that patient must return to room for RA medication because IV team is waiting)   See Function Navigator for Current Functional Status.   Therapy/Group: Individual Therapy  [name] 05/12/2017, 4:28 PM

## 2017-05-12 NOTE — Progress Notes (Signed)
Social Work Patient ID: Donna Rhodes, female   DOB: 08/06/1946, 70 y.o.   MRN: [id number] Met with pt and spoke with daughter-Amy via telephone to discuss team conference progress toward her goals of min-supervision level. Target discharge still 5/23. Discussed having daughter come in to attend therapies with pt, she can come in Monday and stay all day to see her therapies. Will work on discharge needs and follow up. Pt seems to be smiling more and  More optimistic regarding her recovery from this stroke. Discussed with pt her need to stay out of the bed more-in between therapies, she will try.  

## 2017-05-12 NOTE — Progress Notes (Signed)
Subjective/Complaints: Received air splint   ROS: pt denies nausea, vomiting, diarrhea, cough, shortness of breath or chest pain  Objective: Vital Signs: Blood pressure 131/63, pulse 81, temperature 98.3 F (36.8 C), temperature source Oral, resp. rate 18, height '5\' 3"'  (1.6 m), weight 51.9 kg (114 lb 6.7 oz), last menstrual period 12/29/1981, SpO2 96 %. No results found. Results for orders placed or performed during the hospital encounter of 04/27/17 (from the past 72 hour(s))  Basic metabolic panel     Status: None   Collection Time: 05/11/17  7:19 AM  Result Value Ref Range   Sodium 140 135 - 145 mmol/L   Potassium 3.8 3.5 - 5.1 mmol/L   Chloride 105 101 - 111 mmol/L   CO2 27 22 - 32 mmol/L   Glucose, Bld 93 65 - 99 mg/dL   BUN 17 6 - 20 mg/dL   Creatinine, Ser 0.61 0.44 - 1.00 mg/dL   Calcium 9.0 8.9 - 10.3 mg/dL   GFR calc non Af Amer >60 >60 mL/min   GFR calc Af Amer >60 >60 mL/min    Comment: (NOTE) The eGFR has been calculated using the CKD EPI equation. This calculation has not been validated in all clinical situations. eGFR's persistently <60 mL/min signify possible Chronic Kidney Disease.    Anion gap 8 5 - 15  CBC     Status: Abnormal   Collection Time: 05/11/17  7:19 AM  Result Value Ref Range   WBC 10.2 4.0 - 10.5 K/uL   RBC 3.76 (L) 3.87 - 5.11 MIL/uL   Hemoglobin 11.4 (L) 12.0 - 15.0 g/dL   HCT 35.9 (L) 36.0 - 46.0 %   MCV 95.5 78.0 - 100.0 fL   MCH 30.3 26.0 - 34.0 pg   MCHC 31.8 30.0 - 36.0 g/dL   RDW 14.5 11.5 - 15.5 %   Platelets 471 (H) 150 - 400 K/uL     HEENT:  Cardio: RRR no JVD Resp: CTA B, normal effort GI: BS positive and ND Skin:   Diffuse maculopapular rash over back and trunk/arms with LESS erythema Neuro:  Alert Motor: LUE 2/5 elbow flexors, triceps LLE: HF, KE 3-/5, ADF 0/PF 2/5 RUE and RLE 5/5 Musc/Skel:  Other Left shoulder subluxation , bilateral hand MCP adn PIP chronic swelling no acute synovitis Gen NAD.  Well-developed.   Assessment/Plan: 1. Functional deficits secondary to Left hemiparesis which require 3+ hours per day of interdisciplinary therapy in a comprehensive inpatient rehab setting. Physiatrist is providing close team supervision and 24 hour management of active medical problems listed below. Physiatrist and rehab team continue to assess barriers to discharge/monitor patient progress toward functional and medical goals. FIM: Function - Bathing Position: Shower Body parts bathed by patient: Left arm, Chest, Abdomen, Front perineal area, Right upper leg, Left upper leg, Buttocks, Right lower leg, Left lower leg Body parts bathed by helper: Right arm Bathing not applicable: Right arm, Back Assist Level: Touching or steadying assistance(Pt > 75%)  Function- Upper Body Dressing/Undressing What is the patient wearing?: Pull over shirt/dress Pull over shirt/dress - Perfomed by patient: Thread/unthread left sleeve, Thread/unthread right sleeve, Put head through opening, Pull shirt over trunk Pull over shirt/dress - Perfomed by helper: Thread/unthread right sleeve, Pull shirt over trunk Assist Level: Supervision or verbal cues Function - Lower Body Dressing/Undressing What is the patient wearing?: Underwear, Pants Position: Wheelchair/chair at sink Underwear - Performed by patient: Thread/unthread right underwear leg, Thread/unthread left underwear leg Underwear - Performed by helper: Pull underwear  up/down Pants- Performed by patient: Thread/unthread right pants leg, Thread/unthread left pants leg Pants- Performed by helper: Pull pants up/down Non-skid slipper socks- Performed by helper: Don/doff right sock, Don/doff left sock Socks - Performed by patient: Don/doff right sock, Don/doff left sock Shoes - Performed by patient: Don/doff right shoe, Don/doff left shoe Shoes - Performed by helper: Don/doff left shoe Assist for footwear: Supervision/touching assist Assist for lower body  dressing: Touching or steadying assistance (Pt > 75%)  Function - Toileting Toileting steps completed by patient: Adjust clothing prior to toileting, Performs perineal hygiene, Adjust clothing after toileting Toileting steps completed by helper: Adjust clothing after toileting Toileting Assistive Devices: Grab bar or rail Assist level: Touching or steadying assistance (Pt.75%)  Function - Air cabin crew transfer assistive device: Grab bar Assist level to toilet: Moderate assist (Pt 50 - 74%/lift or lower) Assist level from toilet: Moderate assist (Pt 50 - 74%/lift or lower) Assist level to bedside commode (at bedside): 2 helpers Assist level from bedside commode (at bedside): 2 helpers  Function - Chair/bed transfer Chair/bed transfer method: Stand pivot Chair/bed transfer assist level: Touching or steadying assistance (Pt > 75%) Chair/bed transfer assistive device: Armrests Chair/bed transfer details: Tactile cues for weight shifting  Function - Locomotion: Wheelchair Will patient use wheelchair at discharge?: Yes Type: Manual Max wheelchair distance: 150 Assist Level: No help, No cues, assistive device, takes more than reasonable amount of time Assist Level: No help, No cues, assistive device, takes more than reasonable amount of time Assist Level: No help, No cues, assistive device, takes more than reasonable amount of time Turns around,maneuvers to table,bed, and toilet,negotiates 3% grade,maneuvers on rugs and over doorsills: No Function - Locomotion: Ambulation Assistive device: Walker-hemi, Cane-quad Max distance: 50 Assist level: Moderate assist (Pt 50 - 74%) Assist level: Touching or steadying assistance (Pt > 75%) Walk 50 feet with 2 turns activity did not occur: Safety/medical concerns Assist level: Moderate assist (Pt 50 - 74%) Walk 150 feet activity did not occur: Safety/medical concerns Walk 10 feet on uneven surfaces activity did not occur: Safety/medical  concerns  Function - Comprehension Comprehension: Auditory Comprehension assist level: Follows complex conversation/direction with extra time/assistive device  Function - Expression Expression: Verbal Expression assist level: Expresses complex ideas: With extra time/assistive device  Function - Social Interaction Social Interaction assist level: Interacts appropriately with others with medication or extra time (anti-anxiety, antidepressant).  Function - Problem Solving Problem solving assist level: Solves complex 90% of the time/cues < 10% of the time  Function - Memory Memory assist level: Recognizes or recalls 90% of the time/requires cueing < 10% of the time Patient normally able to recall (first 3 days only): Current season, Location of own room, Staff names and faces, That he or she is in a hospital   Medical Problem List and Plan: 1. 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 Formulized individual treatment plan based on medical history, underlying problem and comorbidities.  2. DVT Prophylaxis/Anticoagulation: Pharmaceutical: Lovenox 3. Pain Management: tylenol prn 4. Mood: Seems flat--question fatigue. Team to provide ego support. LCSW to follow for evaluation and support.  -neuropsych following trial SSRI, celexa 55m 5. Neuropsych: This patient iscapable of making decisions on herown behalf.  6. Skin/Wound Care: diffuse maculopapular rash--improving  -likely from antibiotics --?cefepime cross reactivity, hx amox allergy, needs medrol pack  -provided  prednisone x 2 doses  -observe for further resolution  -hydrocortisone cream prn 7. Fluids/Electrolytes/Nutrition: Monitor I/O. BMET normal, enc fluids  8. HTN: Montior BP bid. On Norvasc daily    Controlled 5/16 Vitals:   05/11/17 1246 05/12/17 0541  BP: 135/60 131/63  Pulse: 61 81  Resp: 18 18  Temp: 98.3 F (36.8 C) 98.3 F (36.8 C)   9. Dyslipidemia: Continue Lipitor.  10. Hypothyroid: On supplement 11. RA: Managed with methotrexate injections weekly.  12. Anorexia: Will liberalize diet to regular to help with food choices.  13. RLL PNA resolving still some congestion but no fever  Completed vanc and cefepime-  -  14.  Hypoalb- t prostat 15.  Nausea may be related to Celxa vs depression , some loose stool last noc but has not had multiple stools , cont prn compazine 16.  At risk for ankle sprain using air cast  LOS (Days) 15 A FACE TO FACE EVALUATION WAS PERFORMED  Vicki Pasqual E 05/12/2017, 7:43 AM

## 2017-05-12 NOTE — Progress Notes (Signed)
Nutrition Follow-up  DOCUMENTATION CODES:   Not applicable  INTERVENTION:  Encourage adequate PO intake.  Family to bring in meals/snacks that patient prefers to consume.   NUTRITION DIAGNOSIS:   Inadequate oral intake related to poor appetite as evidenced by per patient/family report; ongoing  GOAL:   Patient will meet greater than or equal to 90% of their needs; met  MONITOR:   PO intake, Supplement acceptance, Labs, Weight trends, Skin, I & O's  REASON FOR ASSESSMENT:   Malnutrition Screening Tool    ASSESSMENT:   71 y.o. 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 She was sent home and started developing left sided weakness with left facial weakness and slurred speech.  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   Meal completion has been 75-100% recently. Pt reports having a lack of appetite however is consuming her foods at meals. Noted family has been bringing in snacks and meals that pt prefers to consume. Pt reports no other difficulties. Noted Ensure has been discontinued. PT encouraged to eat her foods at meals. Labs and medications reviewed.   Diet Order:  Diet Heart Room service appropriate? Yes; Fluid consistency: Thin  Skin:  Reviewed, no issues  Last BM:  5/15  Height:   Ht Readings from Last 1 Encounters:  04/27/17 5' 3" (1.6 m)    Weight:   Wt Readings from Last 1 Encounters:  05/12/17 125 lb 14.4 oz (57.1 kg)    Ideal Body Weight:  52.27 kg  BMI:  Body mass index is 22.3 kg/m.  Estimated Nutritional Needs:   Kcal:  1500-1800  Protein:  65-75 grams  Fluid:  >/= 1.5 L/day  EDUCATION NEEDS:   Education needs addressed  Corrin Parker, MS, RD, LDN Pager # [phone] After hours/ weekend pager  # [phone]

## 2017-05-12 NOTE — Progress Notes (Signed)
Spoke to the nurse caring for this patient. She is aware that methotrexate is scheduled for 2pm today. In the past week the patients daughter gave the injection. The nurse will update me as to whether or not the IV team will need to administer the methotrexate or if the daughter will give it. Catalina Pizza

## 2017-05-12 NOTE — Patient Care Conference (Signed)
Inpatient RehabilitationTeam Conference and Plan of Care Update Date: 05/12/2017   Time: 11:00 AM    Patient Name: Donna Rhodes      Medical Record Number: [id number]  Date of Birth: 1946/05/04 Sex: Female         Room/Bed: 4M06C/4M06C-01 Payor Info: Payor: [employer]AGE / Plan: [employer] Must / Product Type: *No Product type* /    Admitting Diagnosis: R CVA  Admit Date/Time:  04/27/2017  2:37 PM Admission Comments: No comment available   Primary Diagnosis:  Right-sided lacunar infarction [facility]) Principal Problem: Right-sided lacunar infarction [facility])  Patient Active Problem List   Diagnosis Date Noted  . Adjustment disorder with mixed anxiety and depressed mood   . Hypokalemia   . HCAP (healthcare-associated pneumonia)   . Cough with fever   . 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 FUO (fever of unknown origin)   . Oral thrush   . Benign essential HTN   . Rheumatoid arthritis involving both hands ([place]) 04/28/2017  . 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[place]) 04/27/2017  . CVA (cerebral vascular accident) ([place]) 04/24/2017  . LGSIL Pap smear of vagina 12/15/2016  . Anal skin tag 02/02/2016  . Inflammatory arthritis 01/19/2016  . Right knee pain 01/19/2016  . Genital herpes 10/20/2015  . GERD (gastroesophageal reflux disease) 10/20/2015  . Status post vaginal hysterectomy 10/20/2015  . Health care maintenance 05/30/2015  . LLQ pain 03/31/2014  . Menopausal symptoms 03/31/2014  . Hypercholesterolemia 12/31/2012  . Hypothyroidism 12/29/2012  . Osteoarthritis 12/29/2012  . PULMONARY NODULE 01/02/2008  . Depression 12/30/2007  . Essential hypertension 12/30/2007    Expected Discharge Date: Expected Discharge Date: 05/19/17  Team Members Present: Physician leading conference: Dr. Alysia Penna Social Worker Present: Ovidio Kin, LCSW Nurse Present: Rayetta Pigg, RN PT Present: Dwyane Dee, PT OT Present: Cherylynn Ridges, OT SLP Present:  Weston Anna, SLP PPS Coordinator present : Daiva Nakayama, RN, CRRN     Current Status/Progress Goal Weekly Team Focus  Medical   Off IV abx, Left ankle instability, rash due to meds  ID med causing rash  treat rash   Bowel/Bladder   Continent of B&B.   BM- 5/15.  Continue scheduled meds.   Swallow/Nutrition/ Hydration   Regular textures with thin liquids, Mod I  Mod I  Goals Met   ADL's   Min A overall  Mod I/supervision  L NMR, pt/family ed, modified bathing/dressing, standing balance, e-stim   Mobility   min overall  supervision/min assist   postural control, midline orientation, transfers, balance, ambulation, NMR   Communication   N/A  N/A  N/A   Safety/Cognition/ Behavioral Observations  Mod I  Mod I  Goals Met    Pain   Rash is very light but continues to annoy her. No pain noted in the past few days.  Keep pain <2.  Continue pain monitoring.   Skin   Two former IV sites continue with minimal swelling, but one of the sites has some redness and is still tender. Rash light but still causing irritation.  Patient to maintain skin integrity.  Continue to monitor RFA and skin integrity.      *See Care Plan and progress notes for long and short-term goals.  Barriers to Discharge: heavy assist,see above    Possible Resolutions to Barriers:  cont med management    Discharge Planning/Teaching Needs:  Daughter's here daily and providing emotional assist. Will be providing physical assist at discharge. Need to come in for family education.  Team Discussion:  Progressing toward her goals of min-supervision level. Off IV antibiotics-pneumonia treated .Rash due to medications-steroids. On celexa now for depression. DC-SP due to met her goals. Focus more on OT & PT. Air cast on ankle for stability. Upgraded diet to regular thin. Downgraded PT goals to min assist level. Will schedule family education with daughter's  Revisions to Treatment Plan:  DC 5/23 downgraded PT goals to  min assist level   Continued Need for Acute Rehabilitation Level of Care: The patient requires daily medical management by a physician with specialized training in physical medicine and rehabilitation for the following conditions: Daily direction of a multidisciplinary physical rehabilitation program to ensure safe treatment while eliciting the highest outcome that is of practical value to the patient.: Yes Daily medical management of patient stability for increased activity during participation in an intensive rehabilitation regime.: Yes Daily analysis of laboratory values and/or radiology reports with any subsequent need for medication adjustment of medical intervention for : Neurological problems;Pulmonary problems  Brogan Martis, Gardiner Rhyme 05/12/2017, 1:17 PM

## 2017-05-13 ENCOUNTER — Inpatient Hospital Stay (HOSPITAL_COMMUNITY): Payer: PPO | Admitting: Physical Therapy

## 2017-05-13 ENCOUNTER — Inpatient Hospital Stay (HOSPITAL_COMMUNITY): Payer: PPO | Admitting: Occupational Therapy

## 2017-05-13 NOTE — Progress Notes (Addendum)
Occupational Therapy Session Note  Patient Details  Name: Donna Rhodes MRN: [id number] Date of Birth: 1946/01/09  Today's Date: 05/13/2017 OT Concurrent Time: 1305-1400 OT Concurrent Time Calculation (min): 55 min  Short Term Goals: Week 2:  OT Short Term Goal 1 (Week 2): Pt will maintain dynamic standing balance with mod A x1 in preparation for ADL OT Short Term Goal 2 (Week 2): Pt will complete UB dressing with Min A OT Short Term Goal 3 (Week 2): Pt will complete stand-pivot transfer to toilet with min A  Skilled Therapeutic Interventions/Progress Updates:    OT treatment session focused on standing balance, one-handed iADL modifications, kitchen access, and L e-stem. Educated pt on wheelchair positioning for safe kicthen access. Pt able to complete sit<>stand at counter with close supervision. Min guard A to reach overhead and collect large bowl for simple meal prep task. Provided pt with dysom to stabilize bowl while stirring and serving pudding. Pt side-stepped along edge of counter with min A to L, and close supervision to R. Pt then completed self-ROM of L UE, and 15 mins of e-stem focused on L wrist extensors. Returned to room and left seated in wc.  Therapy Documentation Precautions:  Precautions Precautions: Fall Precaution Comments: L hemiplegia Restrictions Weight Bearing Restrictions: No General:  Pain:  denies pain ADL: ADL ADL Comments: Please see functional navigator  See Function Navigator for Current Functional Status.   Therapy/Group: Concurrent Therapy  Daneen Schick Branon Sabine 05/13/2017, 4:05 PM

## 2017-05-13 NOTE — Progress Notes (Signed)
Physical Therapy Weekly Progress Note  Patient Details  Name: Donna Rhodes MRN: [id number] Date of Birth: 11-Feb-1946  Beginning of progress report period: May 05, 2017 End of progress report period: May 12, 2017  Today's Date: 05/13/2017 PT Individual Time: 1000-1100 PT Individual Time Calculation (min): 60 min   Patient has met 3 of 3 short term goals.  Pt has made exceptional progress this reporting period and is current performing all functional mobility with min assist or better.  Pt ambulating with LBQC short distances and propelling w/c with supervision for longer distances.   Patient continues to demonstrate the following deficits muscle weakness, unbalanced muscle activation, motor apraxia and decreased coordination, decreased attention to left and decreased standing balance, decreased postural control, hemiplegia and decreased balance strategies and therefore will continue to benefit from skilled PT intervention to increase functional independence with mobility.  Patient progressing toward long term goals..  Plan of care revisions: downgraded ambulation goal to min assist for household ambulation.  PT Short Term Goals Week 2:  PT Short Term Goal 1 (Week 2): Pt will ambulate 66' with LRAD and mod assist PT Short Term Goal 1 - Progress (Week 2): Met PT Short Term Goal 2 (Week 2): Pt will propel w/c with supervision PT Short Term Goal 2 - Progress (Week 2): Met PT Short Term Goal 3 (Week 2): Pt will demo static standing balance with single UE support with min assist  PT Short Term Goal 3 - Progress (Week 2): Met Week 3:  PT Short Term Goal 1 (Week 3): =LTGs due to ELOS  Skilled Therapeutic Interventions/Progress Updates:    no c/o pain, tolerated air cast.  Session focus on LLE strengthening and positioning during functional standing tasks.    Gait training within gym, up to 5' with [facility] and consistent min assist, pt demos more LLE varus with fatigue.  Stair negotiation x4  steps with R ascending/L descending rail and up to mod assist for LLE stability during descent.  Nustep x10 minutes with all 4 extremities at level 4 focus on LUE strengthening and coordination and overall activity tolerance.  Pt required seated rest breaks throughout session for fatigue.  Returned to room in w/c at end of session, positioned in bed with min assist for ambulatory transfer and supervision for bed mobility.  Call bell in reach and needs met.   Therapy Documentation Precautions:  Precautions Precautions: Fall Precaution Comments: L hemiplegia Restrictions Weight Bearing Restrictions: No   See Function Navigator for Current Functional Status.  Therapy/Group: Individual Therapy  [name] 05/13/2017, 4:48 PM

## 2017-05-13 NOTE — Progress Notes (Addendum)
Subjective/Complaints:  Slept ok no new issues, no cough , itching improving, discussed mood and SSRI  ROS: pt denies nausea, vomiting, diarrhea, cough, shortness of breath or chest pain  Objective: Vital Signs: Blood pressure (!) 120/58, pulse 62, temperature 97.4 F (36.3 C), temperature source Oral, resp. rate 16, height '5\' 3"'  (1.6 m), weight 56.7 kg (125 lb), last menstrual period 12/29/1981, SpO2 96 %. No results found. Results for orders placed or performed during the hospital encounter of 04/27/17 (from the past 72 hour(s))  Basic metabolic panel     Status: None   Collection Time: 05/11/17  7:19 AM  Result Value Ref Range   Sodium 140 135 - 145 mmol/L   Potassium 3.8 3.5 - 5.1 mmol/L   Chloride 105 101 - 111 mmol/L   CO2 27 22 - 32 mmol/L   Glucose, Bld 93 65 - 99 mg/dL   BUN 17 6 - 20 mg/dL   Creatinine, Ser 0.61 0.44 - 1.00 mg/dL   Calcium 9.0 8.9 - 10.3 mg/dL   GFR calc non Af Amer >60 >60 mL/min   GFR calc Af Amer >60 >60 mL/min    Comment: (NOTE) The eGFR has been calculated using the CKD EPI equation. This calculation has not been validated in all clinical situations. eGFR's persistently <60 mL/min signify possible Chronic Kidney Disease.    Anion gap 8 5 - 15  CBC     Status: Abnormal   Collection Time: 05/11/17  7:19 AM  Result Value Ref Range   WBC 10.2 4.0 - 10.5 K/uL   RBC 3.76 (L) 3.87 - 5.11 MIL/uL   Hemoglobin 11.4 (L) 12.0 - 15.0 g/dL   HCT 35.9 (L) 36.0 - 46.0 %   MCV 95.5 78.0 - 100.0 fL   MCH 30.3 26.0 - 34.0 pg   MCHC 31.8 30.0 - 36.0 g/dL   RDW 14.5 11.5 - 15.5 %   Platelets 471 (H) 150 - 400 K/uL     HEENT:  Cardio: RRR no JVD Resp: CTA B, normal effort GI: BS positive and ND Skin:   Diffuse maculopapular rash over back and trunk/arms with LESS erythema Neuro:  Alert Motor: LUE 2/5 elbow flexors, triceps LLE: HF, KE 3-/5, ADF 0/PF 2/5 RUE and RLE 5/5 Musc/Skel:  Other Left shoulder subluxation , bilateral hand MCP adn PIP chronic  swelling no acute synovitis Gen NAD. Well-developed.   Assessment/Plan: 1. Functional deficits secondary to Left hemiparesis which require 3+ hours per day of interdisciplinary therapy in a comprehensive inpatient rehab setting. Physiatrist is providing close team supervision and 24 hour management of active medical problems listed below. Physiatrist and rehab team continue to assess barriers to discharge/monitor patient progress toward functional and medical goals. FIM: Function - Bathing Position: Shower Body parts bathed by patient: Left arm, Chest, Abdomen, Front perineal area, Right upper leg, Left upper leg, Buttocks, Right lower leg, Left lower leg Body parts bathed by helper: Right arm Bathing not applicable: Right arm, Back Assist Level: Touching or steadying assistance(Pt > 75%)  Function- Upper Body Dressing/Undressing What is the patient wearing?: Pull over shirt/dress Pull over shirt/dress - Perfomed by patient: Thread/unthread left sleeve, Thread/unthread right sleeve, Put head through opening, Pull shirt over trunk Pull over shirt/dress - Perfomed by helper: Thread/unthread right sleeve, Pull shirt over trunk Assist Level: Supervision or verbal cues Function - Lower Body Dressing/Undressing What is the patient wearing?: Underwear, Pants Position: Wheelchair/chair at sink Underwear - Performed by patient: Thread/unthread right underwear leg, Thread/unthread  left underwear leg Underwear - Performed by helper: Pull underwear up/down Pants- Performed by patient: Thread/unthread right pants leg, Thread/unthread left pants leg Pants- Performed by helper: Pull pants up/down Non-skid slipper socks- Performed by helper: Don/doff right sock, Don/doff left sock Socks - Performed by patient: Don/doff right sock, Don/doff left sock Shoes - Performed by patient: Don/doff right shoe, Don/doff left shoe Shoes - Performed by helper: Don/doff left shoe Assist for footwear:  Supervision/touching assist Assist for lower body dressing: Touching or steadying assistance (Pt > 75%)  Function - Toileting Toileting steps completed by patient: Adjust clothing prior to toileting, Performs perineal hygiene, Adjust clothing after toileting Toileting steps completed by helper: Adjust clothing after toileting Toileting Assistive Devices: Grab bar or rail Assist level: Touching or steadying assistance (Pt.75%)  Function - Air cabin crew transfer assistive device: Grab bar Assist level to toilet: Moderate assist (Pt 50 - 74%/lift or lower) Assist level from toilet: Moderate assist (Pt 50 - 74%/lift or lower) Assist level to bedside commode (at bedside): 2 helpers Assist level from bedside commode (at bedside): 2 helpers  Function - Chair/bed transfer Chair/bed transfer method: Stand pivot Chair/bed transfer assist level: Touching or steadying assistance (Pt > 75%) Chair/bed transfer assistive device: Armrests, Cane Chair/bed transfer details: Tactile cues for weight shifting  Function - Locomotion: Wheelchair Will patient use wheelchair at discharge?: Yes Type: Manual Max wheelchair distance: 150 Assist Level: No help, No cues, assistive device, takes more than reasonable amount of time Assist Level: No help, No cues, assistive device, takes more than reasonable amount of time Assist Level: No help, No cues, assistive device, takes more than reasonable amount of time Turns around,maneuvers to table,bed, and toilet,negotiates 3% grade,maneuvers on rugs and over doorsills: No Function - Locomotion: Ambulation Assistive device: Cane-quad Max distance: 60 Assist level: Touching or steadying assistance (Pt > 75%) Assist level: Touching or steadying assistance (Pt > 75%) Walk 50 feet with 2 turns activity did not occur: Safety/medical concerns Assist level: Touching or steadying assistance (Pt > 75%) Walk 150 feet activity did not occur: Safety/medical  concerns Walk 10 feet on uneven surfaces activity did not occur: Safety/medical concerns  Function - Comprehension Comprehension: Auditory Comprehension assist level: Follows complex conversation/direction with extra time/assistive device  Function - Expression Expression: Verbal Expression assist level: Expresses complex ideas: With extra time/assistive device  Function - Social Interaction Social Interaction assist level: Interacts appropriately 90% of the time - Needs monitoring or encouragement for participation or interaction.  Function - Problem Solving Problem solving assist level: Solves complex problems: With extra time  Function - Memory Memory assist level: More than reasonable amount of time Patient normally able to recall (first 3 days only): Current season, Location of own room, Staff names and faces, That he or she is in a hospital   Medical Problem List and Plan: 1. 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 DVT Prophylaxis/Anticoagulation: Pharmaceutical: Lovenox 3. Pain Management: tylenol prn 4. Mood: Seems flat--question fatigue. Team to provide ego support. LCSW to follow for evaluation and support.  -neuropsych following trial SSRI, celexa 23m 5. Neuropsych: This patient iscapable of making decisions on herown behalf.  6. Skin/Wound Care: diffuse maculopapular rash--improving  -likely from antibiotics --?cefepime cross reactivity, hx amox allergy, needs medrol pack  -provided prednisone x 2 doses  -observe for further resolution  -hydrocortisone cream prn 7. Fluids/Electrolytes/Nutrition: Monitor I/O. BMET normal, enc fluids  8. HTN: Montior BP bid. On  Norvasc daily   Controlled 5/17 Vitals:   05/12/17 1607 05/13/17 0500  BP: (!) 114/55 (!) 120/58  Pulse: 67 62  Resp: 18 16  Temp: 98 F (36.7 C) 97.4 F (36.3 C)   9. Dyslipidemia: Continue Lipitor.  10. Hypothyroid:  On supplement 11. RA: Managed with methotrexate injections weekly.Usually administers them to herself at home now unable to due to CVA, per hosp policy pt on chemo prec even though not needed in outpt setting.   12. Anorexia: Will liberalize diet to regular to help with food choices.  13. RLL PNA resolving still some congestion but no fever  Completed vanc and cefepime-  -  14.  Hypoalb- t prostat 15.  Nausea may be related to Celxa vs depression , some loose stool last noc but has not had multiple stools , cont prn compazine 16.  At risk for ankle sprain using air cast  LOS (Days) 16 A FACE TO FACE EVALUATION WAS PERFORMED  Kassey Laforest E 05/13/2017, 7:19 AM

## 2017-05-13 NOTE — Progress Notes (Signed)
Occupational Therapy Session Note  Patient Details  Name: Donna Rhodes MRN: [id number] Date of Birth: 06-Apr-1946  Today's Date: 05/13/2017  Session 1 OT Individual Time: 0800-0900 OT Individual Time Calculation (min): 60 min   Session 2 OT Individual Time: 5929-2446 OT Individual Time Calculation (min): 40 min    Short Term Goals: Week 2:  OT Short Term Goal 1 (Week 2): Pt will maintain dynamic standing balance with mod A x1 in preparation for ADL OT Short Term Goal 2 (Week 2): Pt will complete UB dressing with Min A OT Short Term Goal 3 (Week 2): Pt will complete stand-pivot transfer to toilet with min A  Skilled Therapeutic Interventions/Progress Updates:  Session 1   OT treatment session focused on L NMR, functional use of L UE, and standing balance within functional bathing/dressing tasks. Pt completed stand pivot transfers with overall Min A. Transfer in/out of shower using grab bars was close supervision. Pt completed bathing using weight bearing and hemi techniques with min A. LB and UB dressing with set-up and close supervision. Min cues for hemi techniques to thread L U Ethrough shirt. Verbal cues for LLE positioning with sit<>stand and close supervision for standing balance. Pt left seated in wc at end of session with needs met.  Session 2 OT treatment session focused on splint modifications, splint management, and  LUE ROM. OT modified night resting hand splint to decrease thumb hyperextension. Removed tuhmb portion of interior splint, padded area, then sewed Velcro to splint cover for comfort.  Educated pt on splint wearing schedule, proper positioning, and practiced ways to don/doff splint. Pt then completed L UE self-ROM. Pt returned to bed with close supervision stand-pivot.   Therapy Documentation Precautions:  Precautions Precautions: Fall Precaution Comments: L hemiplegia Restrictions Weight Bearing Restrictions: No Pain:  denies pain ADL: ADL ADL Comments:  Please see functional navigator  See Function Navigator for Current Functional Status.   Therapy/Group: Individual Therapy  Valma Cava 05/13/2017, 4:00 PM

## 2017-05-14 ENCOUNTER — Inpatient Hospital Stay (HOSPITAL_COMMUNITY): Payer: PPO

## 2017-05-14 ENCOUNTER — Inpatient Hospital Stay (HOSPITAL_COMMUNITY): Payer: PPO | Admitting: Occupational Therapy

## 2017-05-14 ENCOUNTER — Inpatient Hospital Stay (HOSPITAL_COMMUNITY): Payer: PPO | Admitting: Physical Therapy

## 2017-05-14 NOTE — Progress Notes (Signed)
Occupational Therapy Weekly Progress Note  Patient Details  Name: Donna Rhodes MRN: [id number] Date of Birth: 11/11/1946  Beginning of progress report period: Apr 28, 2017 End of progress report period: May 14, 2017  Today's Date: 05/14/2017 OT Individual Time: 1433-1534 OT Individual Time Calculation (min): 61 min   Patient has met 3 of 3 short term goals.  Pt has demonstrated improved standing balance, improved transfers, and improved ability to compensate for deficits. She has progressed to an overall min A level for ADLs and functional transfers and is making great progress towards supervision/min A level goals for dc next week.   Patient continues to demonstrate the following deficits: muscle weakness and muscle paralysis, abnormal tone and decreased standing balance, hemiplegia and decreased balance strategies and therefore will continue to benefit from skilled OT intervention to enhance overall performance with BADL and Reduce care partner burden.  Patient progressing toward long term goals..  Continue plan of care.  OT Short Term Goals Week 2:  OT Short Term Goal 1 (Week 2): Pt will maintain dynamic standing balance with mod A x1 in preparation for ADL OT Short Term Goal 1 - Progress (Week 2): Met OT Short Term Goal 2 (Week 2): Pt will complete UB dressing with Min A OT Short Term Goal 2 - Progress (Week 2): Met OT Short Term Goal 3 (Week 2): Pt will complete stand-pivot transfer to toilet with min A OT Short Term Goal 3 - Progress (Week 2): Met Week 3:  OT Short Term Goal 1 (Week 3): STG=LTG 2/2 estimated dc date  Skilled Therapeutic Interventions/Progress Updates:    OT treatment session focused on dc plan, LB dressing, L NMR, e-stim, functional transfers, and standing balance. Educated pt on ways to don L air cast and shoe using shoe horn, pt still required min A for task. Stand-pivot transfers using quad cane with Min A. Pt able to maintain standing balance w/ supervision  to manage clothing before and after toileting. Pt voided bladder and completed peri-care independently. E-stim placed on L forearm extensors for 15 mins. During e-stim, pt completed side-steps along edge of mat with close supervision to R and min guard to L. Discussed dc plan, home set-up, and available assistance at dc. Pt returned to room at end of session with needs met.   Therapy Documentation Precautions:  Precautions Precautions: Fall Precaution Comments: L hemiplegia Restrictions Weight Bearing Restrictions: No Pain:  none/denies pain ADL: ADL ADL Comments: Please see functional navigator  See Function Navigator for Current Functional Status.   Therapy/Group: Individual Therapy   S  05/14/2017, 2:30 PM   

## 2017-05-14 NOTE — Progress Notes (Signed)
Subjective/Complaints:  Patient slept well, no headache, no shoulder pain  ROS: pt denies nausea, vomiting, diarrhea, cough, shortness of breath or chest pain  Objective: Vital Signs: Blood pressure 118/62, pulse 64, temperature 98.2 F (36.8 C), temperature source Oral, resp. rate 18, height '5\' 3"'  (1.6 m), weight 52.5 kg (115 lb 12.8 oz), last menstrual period 12/29/1981, SpO2 97 %. No results found. Results for orders placed or performed during the hospital encounter of 04/27/17 (from the past 72 hour(s))  Basic metabolic panel     Status: None   Collection Time: 05/11/17  7:19 AM  Result Value Ref Range   Sodium 140 135 - 145 mmol/L   Potassium 3.8 3.5 - 5.1 mmol/L   Chloride 105 101 - 111 mmol/L   CO2 27 22 - 32 mmol/L   Glucose, Bld 93 65 - 99 mg/dL   BUN 17 6 - 20 mg/dL   Creatinine, Ser 0.61 0.44 - 1.00 mg/dL   Calcium 9.0 8.9 - 10.3 mg/dL   GFR calc non Af Amer >60 >60 mL/min   GFR calc Af Amer >60 >60 mL/min    Comment: (NOTE) The eGFR has been calculated using the CKD EPI equation. This calculation has not been validated in all clinical situations. eGFR's persistently <60 mL/min signify possible Chronic Kidney Disease.    Anion gap 8 5 - 15  CBC     Status: Abnormal   Collection Time: 05/11/17  7:19 AM  Result Value Ref Range   WBC 10.2 4.0 - 10.5 K/uL   RBC 3.76 (L) 3.87 - 5.11 MIL/uL   Hemoglobin 11.4 (L) 12.0 - 15.0 g/dL   HCT 35.9 (L) 36.0 - 46.0 %   MCV 95.5 78.0 - 100.0 fL   MCH 30.3 26.0 - 34.0 pg   MCHC 31.8 30.0 - 36.0 g/dL   RDW 14.5 11.5 - 15.5 %   Platelets 471 (H) 150 - 400 K/uL     HEENT:  Cardio: RRR no JVD Resp: CTA B, normal effort GI: BS positive and ND Skin:   Diffuse maculopapular rash over back and trunk/arms with LESS erythema Neuro:  Alert Motor: LUE 2/5 elbow flexors, triceps LLE: HF, KE 3-/5, ADF 0/PF 2/5 RUE and RLE 5/5 Musc/Skel:  Other Left shoulder subluxation , bilateral hand MCP adn PIP chronic swelling no acute  synovitis Gen NAD. Well-developed.   Assessment/Plan: 1. Functional deficits secondary to Left hemiparesis which require 3+ hours per day of interdisciplinary therapy in a comprehensive inpatient rehab setting. Physiatrist is providing close team supervision and 24 hour management of active medical problems listed below. Physiatrist and rehab team continue to assess barriers to discharge/monitor patient progress toward functional and medical goals. FIM: Function - Bathing Position: Shower Body parts bathed by patient: Left arm, Chest, Abdomen, Front perineal area, Right upper leg, Left upper leg, Buttocks, Right lower leg, Left lower leg Body parts bathed by helper: Right arm Bathing not applicable: Right arm, Back Assist Level: Touching or steadying assistance(Pt > 75%)  Function- Upper Body Dressing/Undressing What is the patient wearing?: Pull over shirt/dress Pull over shirt/dress - Perfomed by patient: Thread/unthread left sleeve, Thread/unthread right sleeve, Put head through opening, Pull shirt over trunk Pull over shirt/dress - Perfomed by helper: Thread/unthread right sleeve, Pull shirt over trunk Assist Level: Supervision or verbal cues Function - Lower Body Dressing/Undressing What is the patient wearing?: Underwear, Pants Position: Wheelchair/chair at sink Underwear - Performed by patient: Thread/unthread right underwear leg, Thread/unthread left underwear leg Underwear -  Performed by helper: Pull underwear up/down Pants- Performed by patient: Thread/unthread right pants leg, Thread/unthread left pants leg Pants- Performed by helper: Pull pants up/down Non-skid slipper socks- Performed by helper: Don/doff right sock, Don/doff left sock Socks - Performed by patient: Don/doff right sock, Don/doff left sock Shoes - Performed by patient: Don/doff right shoe, Don/doff left shoe Shoes - Performed by helper: Don/doff left shoe Assist for footwear: Supervision/touching  assist Assist for lower body dressing: Touching or steadying assistance (Pt > 75%)  Function - Toileting Toileting steps completed by patient: Adjust clothing prior to toileting, Performs perineal hygiene, Adjust clothing after toileting Toileting steps completed by helper: Adjust clothing after toileting Toileting Assistive Devices: Grab bar or rail Assist level: Touching or steadying assistance (Pt.75%)  Function - Air cabin crew transfer assistive device: Grab bar Assist level to toilet: Moderate assist (Pt 50 - 74%/lift or lower) Assist level from toilet: Moderate assist (Pt 50 - 74%/lift or lower) Assist level to bedside commode (at bedside): 2 helpers Assist level from bedside commode (at bedside): 2 helpers  Function - Chair/bed transfer Chair/bed transfer method: Stand pivot Chair/bed transfer assist level: Touching or steadying assistance (Pt > 75%) Chair/bed transfer assistive device: Armrests, Cane Chair/bed transfer details: Tactile cues for weight shifting  Function - Locomotion: Wheelchair Will patient use wheelchair at discharge?: Yes Type: Manual Max wheelchair distance: 150 Assist Level: No help, No cues, assistive device, takes more than reasonable amount of time Assist Level: No help, No cues, assistive device, takes more than reasonable amount of time Assist Level: No help, No cues, assistive device, takes more than reasonable amount of time Turns around,maneuvers to table,bed, and toilet,negotiates 3% grade,maneuvers on rugs and over doorsills: No Function - Locomotion: Ambulation Assistive device: Cane-quad Max distance: 60 Assist level: Touching or steadying assistance (Pt > 75%) Assist level: Touching or steadying assistance (Pt > 75%) Walk 50 feet with 2 turns activity did not occur: Safety/medical concerns Assist level: Touching or steadying assistance (Pt > 75%) Walk 150 feet activity did not occur: Safety/medical concerns Walk 10 feet on  uneven surfaces activity did not occur: Safety/medical concerns  Function - Comprehension Comprehension: Auditory Comprehension assist level: Follows complex conversation/direction with extra time/assistive device  Function - Expression Expression: Verbal Expression assist level: Expresses complex ideas: With extra time/assistive device  Function - Social Interaction Social Interaction assist level: Interacts appropriately with others with medication or extra time (anti-anxiety, antidepressant).  Function - Problem Solving Problem solving assist level: Solves basic 90% of the time/requires cueing < 10% of the time  Function - Memory Memory assist level: Recognizes or recalls 90% of the time/requires cueing < 10% of the time Patient normally able to recall (first 3 days only): Current season, Location of own room, Staff names and faces, That he or she is in a hospital   Medical Problem List and Plan: 1. 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 DVT Prophylaxis/Anticoagulation: Pharmaceutical: Lovenox 3. Pain Management: tylenol prn 4. Mood: Seems flat--question fatigue. Team to provide ego support. LCSW to follow for evaluation and support.  -neuropsych following trial SSRI, celexa 23m 5. Neuropsych: This patient iscapable of making decisions on herown behalf.  6. Skin/Wound Care: diffuse maculopapular rash--resolved on Medrol Dosepak  -likely from antibiotics --?cefepime cross reactivity, hx amox allergy,   -provided prednisone x 2 doses  -observe for further resolution  -hydrocortisone cream prn 7. Fluids/Electrolytes/Nutrition: Monitor I/O. BMET normal, enc fluids  8.  HTN: Montior BP bid. On Norvasc daily   Controlled 5/18 Vitals:   05/13/17 1430 05/14/17 0608  BP: 120/60 118/62  Pulse: 60 64  Resp: 16 18  Temp: 98.2 F (36.8 C) 98.2 F (36.8 C)   9. Dyslipidemia: Continue Lipitor.  10.  Hypothyroid: On supplement 11. RA: Managed with methotrexate injections weekly.Usually administers them to herself at home now unable to due to CVA, per hosp policy pt on chemo prec even though not needed in outpt setting.   12. Anorexia: Will liberalize diet to regular to help with food choices.  13. RLL PNA resolving still some congestion but no fever  Completed vanc and cefepime-  -  14.  Hypoalb- t prostat 15.  Nausea may be related to Celxa vs depression , some loose stool last noc but has not had multiple stools , cont prn compazine 16.  At risk for ankle sprain using air cast  LOS (Days) 17 A FACE TO FACE EVALUATION WAS PERFORMED  KIRSTEINS,ANDREW E 05/14/2017, 6:51 AM

## 2017-05-14 NOTE — Plan of Care (Signed)
Problem: RH Ambulation Goal: LTG Patient will ambulate in controlled environment (PT) LTG: Patient will ambulate in a controlled environment, # of feet with assistance (PT).  Downgraded due to pt progress Goal: LTG Patient will ambulate in home environment (PT) LTG: Patient will ambulate in home environment, # of feet with assistance (PT).  Downgraded due to pt progress Goal: LTG Patient will ambulate in community environment (PT) LTG: Patient will ambulate in community environment, # of feet with assistance (PT).  Outcome: Not Applicable Date Met: 92/42/68 D/C as pt will not be a community ambulator at d/c.   Problem: RH Stairs Goal: LTG Patient will ambulate up and down stairs w/assist (PT) LTG: Patient will ambulate up and down # of stairs with assistance (PT)  Modified 5/18 cpc

## 2017-05-14 NOTE — Progress Notes (Signed)
Last BM 05/11/17. Uses suppository at home.  Offered Miralax dose but refused at this time. Pt request suppository after family has left.  Will pass on to next shift

## 2017-05-14 NOTE — Progress Notes (Signed)
Physical Therapy Session Note  Patient Details  Name: Donna Rhodes MRN: [id number] Date of Birth: 06-09-1946  Today's Date: 05/14/2017 PT Individual Time: 0900-1000 PT Individual Time Calculation (min): 60 min   Short Term Goals: Week 3:  PT Short Term Goal 1 (Week 3): =LTGs due to ELOS  Skilled Therapeutic Interventions/Progress Updates:    no c/o pain.  Session focus on LLE NMR during functional mobility and therex.   Pt transitions supine>sit mod I and dons socks/shoes with supervision, min assist for donning L shoe.  Pt completes ambulatory transfer to bathroom with steady assist for balance with [facility] [place], and completes 3/3 toileting steps with steady assist for standing balance with clothing management.  Hand hygiene and oral care at sink in standing with up to mod assist and verbal cues for foot placement to improve balance.  Pt propels w/c to therapy gym with BLEs for NMR reciprocal stepping pattern and activity tolerance.  Gait in hallway with visual feedback for increased BOS and min assist.  NMR sitting astride small therapy bench on airex pad while in engaged in simple cognitive task.  PT instructs pt in floor transfer with mod assist.  Discussed high fall risk at home and need for 24/7 supervision/assist.  PT instructs pt in BLE minisquats and heel raises at // bars with min assist for balance, 2x10 reps, for LLE NMR and overall strengthening.  Pt returned to room in w/c at end of session and positioned in w/c with call bell in reach and needs met.   Therapy Documentation Precautions:  Precautions Precautions: Fall Precaution Comments: L hemiplegia Restrictions Weight Bearing Restrictions: No   See Function Navigator for Current Functional Status.   Therapy/Group: Individual Therapy  Barclay Lennox E Penven-Crew 05/14/2017, 10:06 AM

## 2017-05-14 NOTE — Progress Notes (Signed)
Occupational Therapy Session Note  Patient Details  Name: Donna Rhodes MRN: [id number] Date of Birth: 10-Jul-1946  Today's Date: 05/14/2017 OT Individual Time: 1000-1115 OT Individual Time Calculation (min): 75 min    Short Term Goals: Week 1:  OT Short Term Goal 1 (Week 1): Pt will complete LB dressing with MOD A OT Short Term Goal 1 - Progress (Week 1): Met OT Short Term Goal 2 (Week 1): Pt will complete UB dressing using hemi-techniques OT Short Term Goal 2 - Progress (Week 1): Met OT Short Term Goal 3 (Week 1): Pt will maintain dynamic standing balance with mod A x1 in preparation for ADLa OT Short Term Goal 3 - Progress (Week 1): Met OT Short Term Goal 4 (Week 1): Pt will demonstrate self-ROM on L UE with min cues OT Short Term Goal 4 - Progress (Week 1): Met  Skilled Therapeutic Interventions/Progress Updates:    1:1. Focus of session on standing balance, UB dressing, and therapeutic exercise Pt stands throughout session with S-MIN A and VC for weight shifting, L knee extension and BOS. Pt stands to complete grooming at sink as stated above to complete 2 grooming tasks with VC for weight bearing  LUE on sink. Pt changes shirt seated in w/c with supervision and VC for adjusting fabric on L sleeve. Pt stands at kitchen sink completing towel glides weight bearing through LUE in flexion/extension, circles, and horizontal ab/adduction. Pt requesting exercises to complete on her own tome. OT provided pt with handout for self ROM with pictures and verbal instructions 2x10 of each exercise with VC for technique. Pt able to return demo each exercise second round without VC. Pt returned to room supine in bed, with call light in reach and all needs met.  Therapy Documentation Precautions:  Precautions Precautions: Fall Precaution Comments: L hemiplegia Restrictions Weight Bearing Restrictions: No General:   Vital Signs:   Pain:   ADL: ADL ADL Comments: Please see functional  navigator  See Function Navigator for Current Functional Status.   Therapy/Group: Individual Therapy  Tonny Branch 05/14/2017, 12:21 PM

## 2017-05-15 ENCOUNTER — Inpatient Hospital Stay (HOSPITAL_COMMUNITY): Payer: PPO | Admitting: Occupational Therapy

## 2017-05-15 NOTE — Progress Notes (Signed)
Subjective/Complaints:    ROS: pt denies nausea, vomiting, diarrhea, cough, shortness of breath or chest pain  Objective: Vital Signs: Blood pressure (!) 146/83, pulse 92, temperature 97.8 F (36.6 C), temperature source Oral, resp. rate 18, height 5\' 3"  (1.6 m), weight 51.2 kg (112 lb 14.4 oz), last menstrual period 12/29/1981, SpO2 98 %. No results found. No results found for this or any previous visit (from the past 72 hour(s)).   HEENT:  Cardio: RRR no JVD Resp: CTA B, normal effort GI: BS positive and ND Skin:   Diffuse maculopapular rash over back and trunk/arms with LESS erythema Neuro: MAS 2 in biceps tone Alert and oriented Motor: LUE 2/5 elbow flexors, triceps LLE: HF, KE 3-/5, ADF 0/PF 2/5 RUE and RLE 5/5 Musc/Skel:  Other Left shoulder subluxation , bilateral hand MCP adn PIP chronic swelling no acute synovitis Gen NAD. Well-developed. Psych - smiling , brighter affect  Assessment/Plan: 1. Functional deficits secondary to Left hemiparesis which require 3+ hours per day of interdisciplinary therapy in a comprehensive inpatient rehab setting. Physiatrist is providing close team supervision and 24 hour management of active medical problems listed below. Physiatrist and rehab team continue to assess barriers to discharge/monitor patient progress toward functional and medical goals. FIM: Function - Bathing Position: Shower Body parts bathed by patient: Left arm, Chest, Abdomen, Front perineal area, Right upper leg, Left upper leg, Buttocks, Right lower leg, Left lower leg Body parts bathed by helper: Right arm Bathing not applicable: Right arm, Back Assist Level: Touching or steadying assistance(Pt > 75%)  Function- Upper Body Dressing/Undressing What is the patient wearing?: Pull over shirt/dress Pull over shirt/dress - Perfomed by patient: Thread/unthread left sleeve, Thread/unthread right sleeve, Put head through opening, Pull shirt over trunk Pull over  shirt/dress - Perfomed by helper: Thread/unthread right sleeve, Pull shirt over trunk Assist Level: Supervision or verbal cues Function - Lower Body Dressing/Undressing What is the patient wearing?: Underwear, Pants Position: Wheelchair/chair at sink Underwear - Performed by patient: Thread/unthread right underwear leg, Thread/unthread left underwear leg Underwear - Performed by helper: Pull underwear up/down Pants- Performed by patient: Thread/unthread right pants leg, Thread/unthread left pants leg Pants- Performed by helper: Pull pants up/down Non-skid slipper socks- Performed by helper: Don/doff right sock, Don/doff left sock Socks - Performed by patient: Don/doff right sock, Don/doff left sock Shoes - Performed by patient: Don/doff right shoe, Don/doff left shoe Shoes - Performed by helper: Don/doff left shoe Assist for footwear: Supervision/touching assist Assist for lower body dressing: Touching or steadying assistance (Pt > 75%)  Function - Toileting Toileting steps completed by patient: Adjust clothing prior to toileting, Performs perineal hygiene, Adjust clothing after toileting Toileting steps completed by helper: Adjust clothing after toileting Toileting Assistive Devices: Grab bar or rail Assist level: Touching or steadying assistance (Pt.75%)  Function - Air cabin crew transfer assistive device: Grab bar Assist level to toilet: Touching or steadying assistance (Pt > 75%) Assist level from toilet: Touching or steadying assistance (Pt > 75%) Assist level to bedside commode (at bedside): 2 helpers Assist level from bedside commode (at bedside): 2 helpers  Function - Chair/bed transfer Chair/bed transfer method: Squat pivot, Stand pivot (supervision for squat/pivot to L, steady assist for ambulatory ) Chair/bed transfer assist level: Touching or steadying assistance (Pt > 75%) Chair/bed transfer assistive device: Armrests, Cane Chair/bed transfer details: Tactile  cues for weight shifting  Function - Locomotion: Wheelchair Will patient use wheelchair at discharge?: Yes Type: Manual Max wheelchair distance: 150 Assist Level: No  help, No cues, assistive device, takes more than reasonable amount of time Assist Level: No help, No cues, assistive device, takes more than reasonable amount of time Assist Level: No help, No cues, assistive device, takes more than reasonable amount of time Turns around,maneuvers to table,bed, and toilet,negotiates 3% grade,maneuvers on rugs and over doorsills: No Function - Locomotion: Ambulation Assistive device: Cane-quad Max distance: 45 Assist level: Touching or steadying assistance (Pt > 75%) Assist level: Touching or steadying assistance (Pt > 75%) Walk 50 feet with 2 turns activity did not occur: Safety/medical concerns Assist level: Touching or steadying assistance (Pt > 75%) Walk 150 feet activity did not occur: Safety/medical concerns Walk 10 feet on uneven surfaces activity did not occur: Safety/medical concerns  Function - Comprehension Comprehension: Auditory Comprehension assist level: Follows complex conversation/direction with extra time/assistive device  Function - Expression Expression: Verbal Expression assist level: Expresses complex ideas: With extra time/assistive device  Function - Social Interaction Social Interaction assist level: Interacts appropriately with others with medication or extra time (anti-anxiety, antidepressant).  Function - Problem Solving Problem solving assist level: Solves complex 90% of the time/cues < 10% of the time  Function - Memory Memory assist level: Recognizes or recalls 90% of the time/requires cueing < 10% of the time Patient normally able to recall (first 3 days only): Current season, Location of own room, Staff names and faces, That he or she is in a hospital   Medical Problem List and Plan: 1. 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 DVT Prophylaxis/Anticoagulation: Pharmaceutical: Lovenox 3. Pain Management: tylenol prn 4. Mood: Seems flat--question fatigue. Team to provide ego support. LCSW to follow for evaluation and support.  -neuropsych following trial SSRI, celexa 10mg , improving affect 5. Neuropsych: This patient iscapable of making decisions on herown behalf.  6. Skin/Wound Care: diffuse maculopapular rash--resolved on Medrol Dosepak  -likely from antibiotics --?cefepime cross reactivity, hx amox allergy,   -provided prednisone x 2 doses  -observe for further resolution  -hydrocortisone cream prn 7. Fluids/Electrolytes/Nutrition: Monitor I/O. BMET normal, enc fluids  8. HTN: Montior BP bid. On Norvasc daily   Controlled 5/19 Vitals:   05/14/17 1311 05/15/17 0624  BP: 122/60 (!) 146/83  Pulse: 62 92  Resp: 18   Temp: 97.9 F (36.6 C) 97.8 F (36.6 C)   9. Dyslipidemia: Continue Lipitor.  10. Hypothyroid: On supplement 11. RA: Managed with methotrexate injections weekly.Usually administers them to herself at home now unable to due to CVA, per hosp policy pt on chemo prec even though not needed in outpt setting.   12. Anorexia: Will liberalize diet to regular to help with food choices.  13. RLL PNA resolving still some congestion but no fever  Completed vanc and cefepime-  -  14.  Hypoalb- t prostat 15.  Nausea improved   16.  At risk for ankle sprain using air cast  LOS (Days) 18 A FACE TO FACE EVALUATION WAS PERFORMED  Mazen Marcin E 05/15/2017, 8:50 AM

## 2017-05-15 NOTE — Progress Notes (Signed)
Occupational Therapy Session Note  Patient Details  Name: Donna Rhodes MRN: [id number] Date of Birth: 08/25/1946   Skilled Therapeutic Interventions/Progress Updates:Patient participated in L UE and LE weight bearing and L UE ROM dynamically and statically standing and w/c level.  She was left with her dtrs at the end of the session.      Therapy Documentation Precautions:  Precautions Precautions: Fall Precaution Comments: L hemiplegia Restrictions Weight Bearing Restrictions: No  Pain:denied    See Function Navigator for Current Functional Status.   Therapy/Group: Individual Therapy  [name] [facility] 05/15/2017, 6:45 PM

## 2017-05-16 ENCOUNTER — Inpatient Hospital Stay (HOSPITAL_COMMUNITY): Payer: PPO

## 2017-05-16 NOTE — Progress Notes (Signed)
Occupational Therapy Session Note  Patient Details  Name: Donna Rhodes MRN: [id number] Date of Birth: 1946-07-27  Today's Date: 05/16/2017 OT Individual Time: 0800-0900 OT Individual Time Calculation (min): 60 min   Short Term Goals: Week 3:  OT Short Term Goal 1 (Week 3): STG=LTG 2/2 estimated dc date  Skilled Therapeutic Interventions/Progress Updates: Therapeutic activity with focus on dynamic standing balance, transfers, functional mobility using RW (to include ascend/descend steps), NMR of LUE to include instruction on table-top exercises, E-stim X 10 min, wrist extension (intensity grade 14).   Pt received in her room dressed and declined BADL session in favor of continued activities to promote recovery of LUE (dominant extremity).   After min assist to don aircast and left shoe, pt was escorted to rehab gym to ambulate short distance from raised mat to stairs (approx 30').  Pt required mod assist to advance left leg and plah ce left foot d/t scissor gait.   Pt required mod assist to ascend stair with vc to lead with right when going up stairs and lead with left when descending.   Pt demo'd mild LOB to her left while descending stairs requiring steadying assist to recover and to weight-shift to her right.   Pt returned to w/c and was setup with E-stim to provoke left wrist extension and finger extension with facilitation provided to extend digits and thumb.  Following E-stim pt was escorted to ADL apartment and instructed on table top exercises using low resistance towel/pillow case to perform left shoulder/elbow flexion/extension and internal/external rotation exercises.   Pt demo'd modest recovery of shoulder elevation/depression, flexion/extension and elbow flex/extension during HEP instruction.     Therapy Documentation Precautions:  Precautions Precautions: Fall Precaution Comments: L hemiplegia Restrictions Weight Bearing Restrictions: No    Pain: Pain Assessment Pain  Assessment: No/denies pain   ADL: ADL ADL Comments: Please see functional navigator   See Function Navigator for Current Functional Status.   Therapy/Group: Individual Therapy  [place] 05/16/2017, 12:57 PM

## 2017-05-16 NOTE — Progress Notes (Signed)
Subjective/Complaints:  Seen in OT, wants to work on amb and LUE strengthening  ROS: pt denies nausea, vomiting, diarrhea, cough, shortness of breath or chest pain  Objective: Vital Signs: Blood pressure (!) 144/65, pulse (!) 53, temperature 97.7 F (36.5 C), temperature source Oral, resp. rate 18, height 5\' 3"  (1.6 m), weight 51.3 kg (113 lb 3.2 oz), last menstrual period 12/29/1981, SpO2 97 %. No results found. No results found for this or any previous visit (from the past 72 hour(s)).   HEENT:  Cardio: RRR no JVD Resp: CTA B, normal effort GI: BS positive and ND Skin:   Diffuse maculopapular rash over back and trunk/arms with LESS erythema Neuro: MAS 2 in biceps tone Alert and oriented Motor: LUE 2/5 elbow flexors, triceps LLE: HF, KE 3-/5, ADF 0/PF 2/5 RUE and RLE 5/5 Musc/Skel:  Other Left shoulder subluxation , bilateral hand MCP adn PIP chronic swelling no acute synovitis Gen NAD. Well-developed. Psych - smiling , brighter affect  Assessment/Plan: 1. Functional deficits secondary to Left hemiparesis which require 3+ hours per day of interdisciplinary therapy in a comprehensive inpatient rehab setting. Physiatrist is providing close team supervision and 24 hour management of active medical problems listed below. Physiatrist and rehab team continue to assess barriers to discharge/monitor patient progress toward functional and medical goals. FIM: Function - Bathing Position: Shower Body parts bathed by patient: Left arm, Chest, Abdomen, Front perineal area, Right upper leg, Left upper leg, Buttocks, Right lower leg, Left lower leg Body parts bathed by helper: Right arm Bathing not applicable: Right arm, Back Assist Level: Touching or steadying assistance(Pt > 75%)  Function- Upper Body Dressing/Undressing What is the patient wearing?: Pull over shirt/dress Pull over shirt/dress - Perfomed by patient: Thread/unthread left sleeve, Thread/unthread right sleeve, Put head  through opening, Pull shirt over trunk Pull over shirt/dress - Perfomed by helper: Thread/unthread right sleeve, Pull shirt over trunk Assist Level: Supervision or verbal cues Function - Lower Body Dressing/Undressing What is the patient wearing?: Underwear, Pants Position: Wheelchair/chair at sink Underwear - Performed by patient: Thread/unthread right underwear leg, Thread/unthread left underwear leg Underwear - Performed by helper: Pull underwear up/down Pants- Performed by patient: Thread/unthread right pants leg, Thread/unthread left pants leg Pants- Performed by helper: Pull pants up/down Non-skid slipper socks- Performed by helper: Don/doff right sock, Don/doff left sock Socks - Performed by patient: Don/doff right sock, Don/doff left sock Shoes - Performed by patient: Don/doff right shoe, Don/doff left shoe Shoes - Performed by helper: Don/doff left shoe Assist for footwear: Supervision/touching assist Assist for lower body dressing: Touching or steadying assistance (Pt > 75%)  Function - Toileting Toileting steps completed by patient: Performs perineal hygiene Toileting steps completed by helper: Adjust clothing prior to toileting, Adjust clothing after toileting Toileting Assistive Devices: Grab bar or rail Assist level: Touching or steadying assistance (Pt.75%)  Function - Air cabin crew transfer assistive device: Grab bar Assist level to toilet: Touching or steadying assistance (Pt > 75%) Assist level from toilet: Touching or steadying assistance (Pt > 75%) Assist level to bedside commode (at bedside): 2 helpers Assist level from bedside commode (at bedside): 2 helpers  Function - Chair/bed transfer Chair/bed transfer method: Squat pivot, Stand pivot (supervision for squat/pivot to L, steady assist for ambulatory ) Chair/bed transfer assist level: Touching or steadying assistance (Pt > 75%) Chair/bed transfer assistive device: Armrests, Cane Chair/bed transfer  details: Tactile cues for weight shifting  Function - Locomotion: Wheelchair Will patient use wheelchair at discharge?: Yes Type: Manual  Max wheelchair distance: 150 Assist Level: No help, No cues, assistive device, takes more than reasonable amount of time Assist Level: No help, No cues, assistive device, takes more than reasonable amount of time Assist Level: No help, No cues, assistive device, takes more than reasonable amount of time Turns around,maneuvers to table,bed, and toilet,negotiates 3% grade,maneuvers on rugs and over doorsills: No Function - Locomotion: Ambulation Assistive device: Cane-quad Max distance: 45 Assist level: Touching or steadying assistance (Pt > 75%) Assist level: Touching or steadying assistance (Pt > 75%) Walk 50 feet with 2 turns activity did not occur: Safety/medical concerns Assist level: Touching or steadying assistance (Pt > 75%) Walk 150 feet activity did not occur: Safety/medical concerns Walk 10 feet on uneven surfaces activity did not occur: Safety/medical concerns  Function - Comprehension Comprehension: Auditory Comprehension assist level: Follows complex conversation/direction with extra time/assistive device  Function - Expression Expression: Verbal Expression assist level: Expresses complex ideas: With extra time/assistive device  Function - Social Interaction Social Interaction assist level: Interacts appropriately with others with medication or extra time (anti-anxiety, antidepressant).  Function - Problem Solving Problem solving assist level: Solves complex 90% of the time/cues < 10% of the time  Function - Memory Memory assist level: Recognizes or recalls 90% of the time/requires cueing < 10% of the time Patient normally able to recall (first 3 days only): Current season, Location of own room, Staff names and faces, That he or she is in a hospital   Medical Problem List and Plan: 1. Left hemiparesissecondary to right basal  ganglia/PLIC infarct ASA and Clopidigrel  Cont PT, OT, SLP,  2. DVT Prophylaxis/Anticoagulation: Pharmaceutical: Lovenox 3. Pain Management: tylenol prn 4. Mood: Seems flat--question fatigue. Team to provide ego support. LCSW to follow for evaluation and support.  -neuropsych following trial SSRI, celexa 10mg , improving affect 5. Neuropsych: This patient iscapable of making decisions on herown behalf.  6. Skin/Wound Care: diffuse maculopapular rash--resolved on Medrol Dosepak  -likely from antibiotics --?cefepime cross reactivity, hx amox allergy,   -Tapering medrol dosepack  -observe for further resolution  -hydrocortisone cream prn 7. Fluids/Electrolytes/Nutrition: Monitor I/O. BMET normal, enc fluids  8. HTN: Montior BP bid. On Norvasc daily   Controlled 5/19 Vitals:   05/15/17 1658 05/16/17 0450  BP: 129/73 (!) 144/65  Pulse: 61 (!) 53  Resp: 18 18  Temp: 97.9 F (36.6 C) 97.7 F (36.5 C)   9. Dyslipidemia: Continue Lipitor.  10. Hypothyroid: On supplement 11. RA: Managed with methotrexate injections weekly.Usually administers them to herself at home now unable to due to CVA, per hosp policy pt on chemo prec even though not needed in outpt setting.    12.  Hypoalb- t prostat   LOS (Days) 19 A FACE TO FACE EVALUATION WAS PERFORMED  Donna Rhodes 05/16/2017, 9:12 AM

## 2017-05-17 ENCOUNTER — Inpatient Hospital Stay (HOSPITAL_COMMUNITY): Payer: PPO | Admitting: Physical Therapy

## 2017-05-17 ENCOUNTER — Inpatient Hospital Stay (HOSPITAL_COMMUNITY): Payer: PPO | Admitting: Occupational Therapy

## 2017-05-17 ENCOUNTER — Other Ambulatory Visit: Payer: PPO

## 2017-05-17 ENCOUNTER — Telehealth: Payer: Self-pay | Admitting: Internal Medicine

## 2017-05-17 NOTE — Telephone Encounter (Signed)
The patient will be released from [facility] today . She has a follow scheduled for 5.30.18 @ 1:00.

## 2017-05-17 NOTE — Progress Notes (Signed)
Occupational Therapy Session Note  Patient Details  Name: Donna Rhodes MRN: [id number] Date of Birth: 1946/05/17  Today's Date: 05/17/2017 OT Individual Time: 1304-1420 OT Individual Time Calculation (min): 76 min    Short Term Goals: Week 1:  OT Short Term Goal 1 (Week 1): Pt will complete LB dressing with MOD A OT Short Term Goal 1 - Progress (Week 1): Met OT Short Term Goal 2 (Week 1): Pt will complete UB dressing using hemi-techniques OT Short Term Goal 2 - Progress (Week 1): Met OT Short Term Goal 3 (Week 1): Pt will maintain dynamic standing balance with mod A x1 in preparation for ADLa OT Short Term Goal 3 - Progress (Week 1): Met OT Short Term Goal 4 (Week 1): Pt will demonstrate self-ROM on L UE with min cues OT Short Term Goal 4 - Progress (Week 1): Met Week 2:  OT Short Term Goal 1 (Week 2): Pt will maintain dynamic standing balance with mod A x1 in preparation for ADL OT Short Term Goal 1 - Progress (Week 2): Met OT Short Term Goal 2 (Week 2): Pt will complete UB dressing with Min A OT Short Term Goal 2 - Progress (Week 2): Met OT Short Term Goal 3 (Week 2): Pt will complete stand-pivot transfer to toilet with min A OT Short Term Goal 3 - Progress (Week 2): Met Week 3:  OT Short Term Goal 1 (Week 3): STG=LTG 2/2 estimated dc date  Skilled Therapeutic Interventions/Progress Updates:    Pt received in room and transported to gym to focus on dynamic balance and LUE NMR.  Pt transferred to mat with stand pivot using quad cane with steadying A. First part of session focused on balance: 10-12 x each exercise -in sitting with dynamic reaching holding clasped hands and reaching to alternating sides with lateral wt shifts -sit to squat with hands clasped -sit to stand with hands clasped  -standing balance with hands clasped focusing on actively maintaining L knee extension Pt able to keep weight centered so there was no leaning to the left with light contact guard for safety.    Continued standing with B hands on elevated table to simulate kitchen counter. Worked on a/arom with hand on cloth with circles to simulate cleaning the counter. Pt demonstrated active shoulder horizontal add and scapular retractions.  In sitting, table slides with LUE on towel for a/arom with sh horiz add/abd, elbow flex/ext, push and pull.  Pt had minimal elbow extension.    Pt moved into supine on mat and worked on placing and holding of L arm at 90 degrees sh flex. Pt able to maintain elbow extension with support at upper arm and then was able to gradually and slowly lower hand toward chin and could then actively extend her elbow 30% of the way.  Repeated this exercise several times. Returned to sitting and then pt was able to actively push elbow into partial extension with resistance.    Estim (small muscle atrophy setting - 35 pps, 10 sec on, 5 off)  to finger flexors for 10 min at intensity 20 with pt focusing on squeezing her hands around a washcloth.    Pt's daughter arrived and pt demonstrated to her daughter the movement patterns she could do and also demonstrated the standing kitchen counter exercises. Pt transferred back to the w/c and returned to her room with her daughter.  Therapy Documentation Precautions:  Precautions Precautions: Fall Precaution Comments: L hemiplegia Restrictions Weight Bearing Restrictions: No  Pain: no c/o pain   ADL: ADL ADL Comments: Please see functional navigator       See Function Navigator for Current Functional Status.   Therapy/Group: Individual Therapy  [place] 05/17/2017, 2:28 PM

## 2017-05-17 NOTE — Progress Notes (Signed)
Social Work Patient ID: Donna Rhodes, female   DOB: Mar 28, 1946, 71 y.o.   MRN: [id number]  Amy-daughter here to go through family education with Mom. Both feel prepared to go home Wednesday. Discussed home health follow up and equipment needs. Daughter reports between she and sister she will have 24 hr care. Will work on discharge needs for Wednesday.

## 2017-05-17 NOTE — Progress Notes (Signed)
Physical Therapy Session Note  Patient Details  Name: Donna Rhodes MRN: [id number] Date of Birth: 10/07/1946  Today's Date: 05/17/2017 PT Individual Time: 1053-1155 PT Individual Time Calculation (min): 62 min    Therapy Documentation Precautions:  Precautions Precautions: Fall Precaution Comments: L hemiplegia Restrictions Weight Bearing Restrictions: No General:   Vital Signs: Therapy Vitals BP: 135/65 Pain:  Patient denies any pain.    Transfers: Sit to and from stand transfer with close supervision; verbal cues even weight distribution and controlled descent.   Pre-gait activities: Patient performed rapid stepping with WBQC for approximately 90 seconds with RLE min assist in order to increase weight bearing of LLE for strengthening and initiation of protective stepping strategies.  Approximation applied at left knee to prevent buckling.   Patient performed step ups on a 6 inch step with WBQC with RLE 20x min assist in order to increase weight bearing of LLE and strengthening and endurance.  Approximation applied at left knee to prevent buckling.   Ambulation: Patient ambulated 125 feet and 75 feet with WBQC min assist. Patient ambulated with a varied step to and step through gait pattern. Verbal cues for increased step length. Patient demonstrates uneven step and stride length with a varying cadence. Manual facilitation for LLE placement and stabilization during stance phase of gait.    Stair negotiation: Patient negotiated 12 steps with right handrail and min assist. Patient performed stairs with a step to pattern. Patient educated on proper sequence and technique and was able to return demonstration with minimal verbal cues.   Patient returned to room at end of session with all needs met and set-up with lunch tray RN Ed notified and aware as patient requires intermittent supervision. All needs met prior to exit   See Function Navigator for Current Functional  Status.   Therapy/Group: Individual Therapy  , J 05/17/2017, 12:32 PM  

## 2017-05-17 NOTE — Progress Notes (Signed)
Subjective/Complaints:  No issues overnite, no arm pain  ROS: pt denies nausea, vomiting, diarrhea, cough, shortness of breath or chest pain  Objective: Vital Signs: Blood pressure 134/68, pulse 63, temperature 98 F (36.7 C), temperature source Oral, resp. rate 18, height 5\' 3"  (1.6 m), weight 51.3 kg (113 lb), last menstrual period 12/29/1981, SpO2 99 %. No results found. No results found for this or any previous visit (from the past 72 hour(s)).   HEENT:  Cardio: RRR no JVD Resp: CTA B, normal effort GI: BS positive and ND Skin:   Diffuse maculopapular rash over back and trunk/arms with LESS erythema Neuro: MAS 2 in biceps tone Alert and oriented Motor: LUE 2/5 elbow flexors, triceps LLE: HF, KE 3-/5, ADF 0/PF 2/5 RUE and RLE 5/5 Musc/Skel:  Other Left shoulder subluxation , bilateral hand MCP adn PIP chronic swelling no acute synovitis Gen NAD. Well-developed. Psych - smiling , brighter affect  Assessment/Plan: 1. Functional deficits secondary to Left hemiparesis which require 3+ hours per day of interdisciplinary therapy in a comprehensive inpatient rehab setting. Physiatrist is providing close team supervision and 24 hour management of active medical problems listed below. Physiatrist and rehab team continue to assess barriers to discharge/monitor patient progress toward functional and medical goals. FIM: Function - Bathing Position: Shower Body parts bathed by patient: Left arm, Chest, Abdomen, Front perineal area, Right upper leg, Left upper leg, Buttocks, Right lower leg, Left lower leg Body parts bathed by helper: Right arm Bathing not applicable: Right arm, Back Assist Level: Touching or steadying assistance(Pt > 75%)  Function- Upper Body Dressing/Undressing What is the patient wearing?: Pull over shirt/dress Pull over shirt/dress - Perfomed by patient: Thread/unthread left sleeve, Thread/unthread right sleeve, Put head through opening, Pull shirt over  trunk Pull over shirt/dress - Perfomed by helper: Thread/unthread right sleeve, Pull shirt over trunk Assist Level: Supervision or verbal cues Function - Lower Body Dressing/Undressing What is the patient wearing?: Underwear, Pants Position: Wheelchair/chair at sink Underwear - Performed by patient: Thread/unthread right underwear leg, Thread/unthread left underwear leg Underwear - Performed by helper: Pull underwear up/down Pants- Performed by patient: Thread/unthread right pants leg, Thread/unthread left pants leg Pants- Performed by helper: Pull pants up/down Non-skid slipper socks- Performed by helper: Don/doff right sock, Don/doff left sock Socks - Performed by patient: Don/doff right sock, Don/doff left sock Shoes - Performed by patient: Don/doff right shoe, Don/doff left shoe Shoes - Performed by helper: Don/doff left shoe Assist for footwear: Supervision/touching assist Assist for lower body dressing: Touching or steadying assistance (Pt > 75%)  Function - Toileting Toileting steps completed by patient: Performs perineal hygiene, Adjust clothing after toileting, Adjust clothing prior to toileting Toileting steps completed by helper: Adjust clothing after toileting, Adjust clothing prior to toileting Toileting Assistive Devices: Grab bar or rail Assist level: Touching or steadying assistance (Pt.75%)  Function - Air cabin crew transfer assistive device: Grab bar Assist level to toilet: Touching or steadying assistance (Pt > 75%) Assist level from toilet: Moderate assist (Pt 50 - 74%/lift or lower) Assist level to bedside commode (at bedside): 2 helpers Assist level from bedside commode (at bedside): 2 helpers  Function - Chair/bed transfer Chair/bed transfer method: Squat pivot, Stand pivot (supervision for squat/pivot to L, steady assist for ambulatory ) Chair/bed transfer assist level: Touching or steadying assistance (Pt > 75%) Chair/bed transfer assistive device:  Armrests, Cane Chair/bed transfer details: Tactile cues for weight shifting  Function - Locomotion: Wheelchair Will patient use wheelchair at discharge?: Yes Type:  Manual Max wheelchair distance: 150 Assist Level: No help, No cues, assistive device, takes more than reasonable amount of time Assist Level: No help, No cues, assistive device, takes more than reasonable amount of time Assist Level: No help, No cues, assistive device, takes more than reasonable amount of time Turns around,maneuvers to table,bed, and toilet,negotiates 3% grade,maneuvers on rugs and over doorsills: No Function - Locomotion: Ambulation Assistive device: Cane-quad Max distance: 45 Assist level: Touching or steadying assistance (Pt > 75%) Assist level: Touching or steadying assistance (Pt > 75%) Walk 50 feet with 2 turns activity did not occur: Safety/medical concerns Assist level: Touching or steadying assistance (Pt > 75%) Walk 150 feet activity did not occur: Safety/medical concerns Walk 10 feet on uneven surfaces activity did not occur: Safety/medical concerns  Function - Comprehension Comprehension: Auditory Comprehension assist level: Follows complex conversation/direction with no assist  Function - Expression Expression: Verbal Expression assist level: Expresses complex ideas: With no assist  Function - Social Interaction Social Interaction assist level: Interacts appropriately with others - No medications needed.  Function - Problem Solving Problem solving assist level: Solves complex problems: Recognizes & self-corrects  Function - Memory Memory assist level: Complete Independence: No helper Patient normally able to recall (first 3 days only): Current season, Location of own room, Staff names and faces, That he or she is in a hospital   Medical Problem List and Plan: 1. 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 DVT  Prophylaxis/Anticoagulation: Pharmaceutical: Lovenox 3. Pain Management: tylenol prn 4. Mood: Seems flat--question fatigue. Team to provide ego support. LCSW to follow for evaluation and support.  -neuropsych following trial SSRI, celexa 10mg , improving affect 5. Neuropsych: This patient iscapable of making decisions on herown behalf.  6. Skin/Wound Care: diffuse maculopapular rash--resolved on Medrol Dosepak  -likely from antibiotics --cefepime cross reactivity, hx amox allergy, Will list  -Tapering medrol dosepack  -observe for further resolution  -hydrocortisone cream prn 7. Fluids/Electrolytes/Nutrition: Monitor I/O. BMET normal, enc fluids  8. HTN: Montior BP bid. On Norvasc daily   Controlled 5/19 Vitals:   05/16/17 1600 05/17/17 0500  BP: 131/87 134/68  Pulse: 60 63  Resp:  18  Temp: 97.8 F (36.6 C) 98 F (36.7 C)   9. Dyslipidemia: Continue Lipitor.  10. Hypothyroid: On supplement 11. RA: Managed with methotrexate injections weekly.Usually administers them to herself at home now unable to due to CVA, per hosp policy pt on chemo prec even though not needed in outpt setting.    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

## 2017-05-17 NOTE — Progress Notes (Signed)
Occupational Therapy Session Note  Patient Details  Name: Donna Rhodes MRN: [id number] Date of Birth: 26-Jun-1946  Today's Date: 05/17/2017 OT Individual Time: 2780-0447 OT Individual Time Calculation (min): 60 min    Short Term Goals: Week 3:  OT Short Term Goal 1 (Week 3): STG=LTG 2/2 estimated dc date  Skilled Therapeutic Interventions/Progress Updates:    OT treatment session focused on pt/family education, transfer training, hemi-techniques, standing balance, functional use of L UE, and L NMR within bathing/dressing tasks. Reviewed home measurement sheet with pt's daughter Amy. Practiced tub shower transfer with tub bench. Pt completed transfer with supervision and min cues for family positioning. Pt then completed shower level bathing with supervision. Stand-pivot transfers in/out of shower with close supervision. UB dressing w/ set-up A. Pt able to don pants, underwear and socks with set-up A, and close supervision for standing balance at the sink. Min A to don L air cast, and L shoe. Educated pt and daughter on home modifications for safe ADL participation. Pt left seated in wc at end of session with needs met and family present.   Therapy Documentation Precautions:  Precautions Precautions: Fall Precaution Comments: L hemiplegia Restrictions Weight Bearing Restrictions: No Pain:  denies pain ADL: ADL ADL Comments: Please see functional navigator  See Function Navigator for Current Functional Status.  Therapy/Group: Individual Therapy  Valma Cava 05/17/2017, 8:56 AM

## 2017-05-18 ENCOUNTER — Inpatient Hospital Stay (HOSPITAL_COMMUNITY): Payer: PPO | Admitting: Physical Therapy

## 2017-05-18 ENCOUNTER — Inpatient Hospital Stay (HOSPITAL_COMMUNITY): Payer: PPO | Admitting: Occupational Therapy

## 2017-05-18 LAB — BASIC METABOLIC PANEL
ANION GAP: 8 (ref 5–15)
BUN: 20 mg/dL (ref 6–20)
CALCIUM: 9.1 mg/dL (ref 8.9–10.3)
CO2: 28 mmol/L (ref 22–32)
Chloride: 104 mmol/L (ref 101–111)
Creatinine, Ser: 0.81 mg/dL (ref 0.44–1.00)
GFR calc Af Amer: >60 mL/min (ref >60)
GFR calc non Af Amer: >60 mL/min (ref >60)
Glucose, Bld: 93 mg/dL (ref 65–99)
Potassium: 3.6 mmol/L (ref 3.5–5.1)
SODIUM: 140 mmol/L (ref 135–145)

## 2017-05-18 LAB — CBC
HCT: 37 % (ref 36.0–46.0)
Hemoglobin: 11.5 g/dL — ABNORMAL LOW (ref 12.0–15.0)
MCH: 29.7 pg (ref 26.0–34.0)
MCHC: 31.1 g/dL (ref 30.0–36.0)
MCV: 95.6 fL (ref 78.0–100.0)
PLATELETS: 368 10*3/uL (ref 150–400)
RBC: 3.87 MIL/uL (ref 3.87–5.11)
RDW: 14.9 % (ref 11.5–15.5)
WBC: 6.7 10*3/uL (ref 4.0–10.5)

## 2017-05-18 NOTE — Progress Notes (Signed)
Subjective/Complaints:  Looking forward to d/c in am  ROS: pt denies nausea, vomiting, diarrhea, cough, shortness of breath or chest pain  Objective: Vital Signs: Blood pressure (!) 143/69, pulse 62, temperature 98.1 F (36.7 C), temperature source Oral, resp. rate 16, height '5\' 3"'  (1.6 m), weight 51.3 kg (113 lb), last menstrual period 12/29/1981, SpO2 98 %. No results found. Results for orders placed or performed during the hospital encounter of 04/27/17 (from the past 72 hour(s))  Basic metabolic panel     Status: None   Collection Time: 05/18/17  4:42 AM  Result Value Ref Range   Sodium 140 135 - 145 mmol/L   Potassium 3.6 3.5 - 5.1 mmol/L   Chloride 104 101 - 111 mmol/L   CO2 28 22 - 32 mmol/L   Glucose, Bld 93 65 - 99 mg/dL   BUN 20 6 - 20 mg/dL   Creatinine, Ser 0.81 0.44 - 1.00 mg/dL   Calcium 9.1 8.9 - 10.3 mg/dL   GFR calc non Af Amer >60 >60 mL/min   GFR calc Af Amer >60 >60 mL/min    Comment: (NOTE) The eGFR has been calculated using the CKD EPI equation. This calculation has not been validated in all clinical situations. eGFR's persistently <60 mL/min signify possible Chronic Kidney Disease.    Anion gap 8 5 - 15  CBC     Status: Abnormal   Collection Time: 05/18/17  4:42 AM  Result Value Ref Range   WBC 6.7 4.0 - 10.5 K/uL   RBC 3.87 3.87 - 5.11 MIL/uL   Hemoglobin 11.5 (L) 12.0 - 15.0 g/dL   HCT 37.0 36.0 - 46.0 %   MCV 95.6 78.0 - 100.0 fL   MCH 29.7 26.0 - 34.0 pg   MCHC 31.1 30.0 - 36.0 g/dL   RDW 14.9 11.5 - 15.5 %   Platelets 368 150 - 400 K/uL     HEENT:  Cardio: RRR no JVD Resp: CTA B, normal effort GI: BS positive and ND Skin:   Diffuse maculopapular rash over back and trunk/arms with LESS erythema Neuro: MAS 2 in biceps tone Alert and oriented Motor: LUE 2/5 elbow flexors, triceps LLE: HF, KE 3-/5, ADF 0/PF 2/5 RUE and RLE 5/5 Musc/Skel:  Other Left shoulder subluxation , bilateral hand MCP adn PIP chronic swelling no acute  synovitis Gen NAD. Well-developed. Psych - smiling , brighter affect  Assessment/Plan: 1. Functional deficits secondary to Left hemiparesis which require 3+ hours per day of interdisciplinary therapy in a comprehensive inpatient rehab setting. Physiatrist is providing close team supervision and 24 hour management of active medical problems listed below. Physiatrist and rehab team continue to assess barriers to discharge/monitor patient progress toward functional and medical goals. FIM: Function - Bathing Position: Shower Body parts bathed by patient: Left arm, Chest, Abdomen, Front perineal area, Buttocks, Right upper leg, Left upper leg, Right lower leg, Left lower leg Body parts bathed by helper: Right arm Bathing not applicable: Right arm, Back Assist Level: Supervision or verbal cues  Function- Upper Body Dressing/Undressing What is the patient wearing?: Pull over shirt/dress Pull over shirt/dress - Perfomed by patient: Thread/unthread right sleeve, Thread/unthread left sleeve, Put head through opening, Pull shirt over trunk Pull over shirt/dress - Perfomed by helper: Thread/unthread right sleeve, Pull shirt over trunk Assist Level: Set up Set up : To obtain clothing/put away Function - Lower Body Dressing/Undressing What is the patient wearing?: Underwear, Pants, Socks Position: Wheelchair/chair at sink Underwear - Performed by patient: Thread/unthread  right underwear leg, Thread/unthread left underwear leg, Pull underwear up/down Underwear - Performed by helper: Pull underwear up/down Pants- Performed by patient: Thread/unthread right pants leg, Thread/unthread left pants leg, Pull pants up/down Pants- Performed by helper: Pull pants up/down Non-skid slipper socks- Performed by patient: Don/doff left sock, Don/doff right sock Non-skid slipper socks- Performed by helper: Don/doff right sock, Don/doff left sock Socks - Performed by patient: Don/doff right sock, Don/doff left  sock Shoes - Performed by patient: Don/doff right shoe, Don/doff left shoe Shoes - Performed by helper: Don/doff left shoe Assist for footwear: Setup Assist for lower body dressing: Supervision or verbal cues  Function - Toileting Toileting steps completed by patient: Adjust clothing prior to toileting, Performs perineal hygiene, Adjust clothing after toileting Toileting steps completed by helper: Adjust clothing after toileting, Adjust clothing prior to toileting Toileting Assistive Devices: Grab bar or rail Assist level: Touching or steadying assistance (Pt.75%)  Function - Air cabin crew transfer assistive device: Grab bar Assist level to toilet: Touching or steadying assistance (Pt > 75%) Assist level from toilet: Moderate assist (Pt 50 - 74%/lift or lower) Assist level to bedside commode (at bedside): 2 helpers Assist level from bedside commode (at bedside): 2 helpers  Function - Chair/bed transfer Chair/bed transfer method: Stand pivot Chair/bed transfer assist level: Touching or steadying assistance (Pt > 75%) Chair/bed transfer assistive device: Armrests, Cane Chair/bed transfer details: Tactile cues for weight shifting, Verbal cues for technique, Verbal cues for sequencing  Function - Locomotion: Wheelchair Will patient use wheelchair at discharge?: Yes Type: Manual Max wheelchair distance: 150 Assist Level: No help, No cues, assistive device, takes more than reasonable amount of time Assist Level: No help, No cues, assistive device, takes more than reasonable amount of time Assist Level: No help, No cues, assistive device, takes more than reasonable amount of time Turns around,maneuvers to table,bed, and toilet,negotiates 3% grade,maneuvers on rugs and over doorsills: No Function - Locomotion: Ambulation Assistive device: Cane-quad Max distance: 120 Assist level: Touching or steadying assistance (Pt > 75%) Assist level: Touching or steadying assistance (Pt >  75%) Walk 50 feet with 2 turns activity did not occur: Safety/medical concerns Assist level: Touching or steadying assistance (Pt > 75%) Walk 150 feet activity did not occur: Safety/medical concerns Walk 10 feet on uneven surfaces activity did not occur: Safety/medical concerns Assist level: Moderate assist (Pt 50 - 74%)  Function - Comprehension Comprehension: Auditory Comprehension assist level: Follows complex conversation/direction with no assist  Function - Expression Expression: Verbal Expression assist level: Expresses complex ideas: With no assist  Function - Social Interaction Social Interaction assist level: Interacts appropriately with others - No medications needed.  Function - Problem Solving Problem solving assist level: Solves complex problems: Recognizes & self-corrects  Function - Memory Memory assist level: Complete Independence: No helper Patient normally able to recall (first 3 days only): Current season, Location of own room, Staff names and faces, That he or she is in a hospital   Medical Problem List and Plan: 1. 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 DVT Prophylaxis/Anticoagulation: Pharmaceutical: Lovenox 3. Pain Management: tylenol prn 4. Mood: Seems flat--question fatigue. Team to provide ego support. LCSW to follow for evaluation and support.  -neuropsych following trial SSRI, celexa 83m, improving affect 5. Neuropsych: This patient iscapable of making decisions on herown behalf.  6. Skin/Wound Care: diffuse maculopapular rash--resolved on Medrol Dosepak  -likely from antibiotics --cefepime cross reactivity, hx amox allergy, Will  list  -Tapering medrol dosepack  -observe for further resolution  -hydrocortisone cream prn 7. Fluids/Electrolytes/Nutrition: Monitor I/O. BMET normal, enc fluids  8. HTN: Montior BP bid. On Norvasc daily   Controlled 5/19 Vitals:    05/17/17 1500 05/18/17 0546  BP: 130/88 (!) 143/69  Pulse: 64 62  Resp: 18 16  Temp: 98.3 F (36.8 C) 98.1 F (36.7 C)   9. Dyslipidemia: Continue Lipitor.  10. Hypothyroid: On supplement 11. RA: Managed with methotrexate injections weekly.Usually administers them to herself at home now unable to due to CVA, per hosp policy pt on chemo prec even though not needed in outpt setting.    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

## 2017-05-18 NOTE — Progress Notes (Signed)
Occupational Therapy Discharge Summary  Patient Details  Name: Donna Rhodes MRN: [id number] Date of Birth: 01-Mar-1946  Today's Date: 05/18/2017 OT Individual Time: 1300-1400 OT Individual Time Calculation (min): 60 min   OT treatment session focused on increased independence with BADL tasks, functional use of L UE, and L NMR. From w/c level, pt gathered clothing, then completed stand-pivot wc<>toilet<>tub bench with overall supervision. Incorporated L NMR within bathing using wash mit. LB and UB dressing completed with overall Mod I w/ L UE utilized as stabilizer. Problem solved ways to don L air cast, with pt eventually able to don with mod I. L NMR-L elbow, shoulder, wrist, hand AAROM. With OT assisted with wrist support OT passively extended fingers, pt able to actively flex fingers! Pt left seated in wc with needs met.  Patient has met 10 of 10 long term goals due to improved activity tolerance, improved balance, postural control, ability to compensate for deficits and functional use of  LEFT upper and LEFT lower extremity.  Patient to discharge at overall Supervision level.  Patient's care partner is independent to provide the necessary physical assistance at discharge.    Reasons goals not met: n/a  Recommendation:  Patient will benefit from ongoing skilled OT services in home health setting to continue to advance functional skills in the area of BADL.  Equipment: 3-in-1 commode, quad cane, wheelchair  Reasons for discharge: treatment goals met and discharge from hospital  Patient/family agrees with progress made and goals achieved: Yes  OT Discharge Precautions/Restrictions  Precautions Precautions: Fall Precaution Comments: L hemiplegia Restrictions Weight Bearing Restrictions: No Pain Pain Assessment Pain Assessment: No/denies pain ADL ADL Equipment Provided: Long-handled shoe horn Eating: Modified independent Grooming: Modified independent Upper Body Bathing:  Modified independent Lower Body Bathing: Modified independent Upper Body Dressing: Modified independent (Device) Where Assessed-Upper Body Dressing: Wheelchair Lower Body Dressing: Supervision/safety Where Assessed-Lower Body Dressing: Wheelchair Toileting: Modified independent Where Assessed-Toileting: Glass blower/designer: Close supervision Toilet Transfer Method: Arts development officer: Energy manager: Close supervison Clinical cytogeneticist Method: Librarian, academic: Grab bars, Radio broadcast assistant ADL Comments: Please see functional navigator [employer]: Within Designer, television/film set  Perception: Within Functional Limits Praxis Praxis: Intact Cognition Overall Cognitive Status: Within Functional Limits for tasks assessed Arousal/Alertness: Awake/alert Orientation Level: Oriented X4 Problem Solving: Appears intact Safety/Judgment: Appears intact Sensation Sensation Light Touch: Appears Intact Hot/Cold: Appears Intact Coordination Gross Motor Movements are Fluid and Coordinated: No Fine Motor Movements are Fluid and Coordinated: No Coordination and Movement Description: L UE flexor tone Motor  Motor Motor: Hemiplegia;Abnormal tone Motor - Discharge Observations: L UE flexor tone, able to utilize tone functionally Mobility  Transfers Sit to Stand: 5: Supervision  Trunk/Postural Assessment  Cervical Assessment Cervical Assessment: Within Functional Limits Thoracic Assessment Thoracic Assessment: Within Functional Limits Lumbar Assessment Lumbar Assessment: Within Functional Limits Postural Control Postural Control: Within Functional Limits  Balance Balance Balance Assessed: Yes Dynamic Sitting Balance Dynamic Sitting - Balance Support: Feet supported;During functional activity Dynamic Sitting - Level of Assistance: 6: Modified independent (Device/Increase time) Dynamic Standing Balance Dynamic  Standing - Balance Support: During functional activity Dynamic Standing - Level of Assistance: 5: Stand by assistance Extremity/Trunk Assessment RUE Assessment RUE Assessment: Within Functional Limits LUE Assessment LUE Assessment: Exceptions to [facility] LUE AROM (degrees) Left Shoulder Flexion: 60 Degrees Left Elbow Flexion: 90 Left Composite Finger Flexion: 50% LUE PROM (degrees) LUE Overall PROM Comments: Good return with flexor synergy patterns, proximally in shoudler/elbow- able to  actively push and pull, with wrist supported, and assistance to extend fingers, pt able to actively flex fingers 50%.  LUE Tone LUE Tone: Modified Ashworth;Mild;Moderate Modified Ashworth Scale for Grading Hypertonia LUE: More marked increase in muscle tone through most of the ROM, but affected part(s) easily moved LUE Tone Comments: Extensor tone, but able to break tone with gentle ROM.   See Function Navigator for Current Functional Status.   S  05/18/2017, 4:13 PM 

## 2017-05-18 NOTE — Telephone Encounter (Signed)
Patient will not discharge until 05/19/17 per chart.

## 2017-05-18 NOTE — Progress Notes (Signed)
Recreational Therapy Session Note  Patient Details  Name: Donna Rhodes MRN: [id number] Date of Birth: 12/16/1946 Today's Date: 05/18/2017  Pain: no c/o Skilled Therapeutic Interventions/Progress Updates: Session focused on discharge planning and community reintegration during co-treat with PT.  Pt propelled w/c on even surfaces with close supervision/min assist and uneven surfaces including curb cut with mod-max assist.  Pt needed verbal cues to attend to LLE during all mobility tasks.  Discussed energy conservation techniques, safety concerns, & use of leisure time post discharge. [place] 05/18/2017, 2:40 PM

## 2017-05-18 NOTE — Progress Notes (Signed)
Occupational Therapy Session Note  Patient Details  Name: Donna Rhodes MRN: [id number] Date of Birth: 09/01/1946  Today's Date: 05/18/2017 OT Individual Time: 1115-1200 OT Individual Time Calculation (min): 45 min    Short Term Goals: Week 1:  OT Short Term Goal 1 (Week 1): Pt will complete LB dressing with MOD A OT Short Term Goal 1 - Progress (Week 1): Met OT Short Term Goal 2 (Week 1): Pt will complete UB dressing using hemi-techniques OT Short Term Goal 2 - Progress (Week 1): Met OT Short Term Goal 3 (Week 1): Pt will maintain dynamic standing balance with mod A x1 in preparation for ADLa OT Short Term Goal 3 - Progress (Week 1): Met OT Short Term Goal 4 (Week 1): Pt will demonstrate self-ROM on L UE with min cues OT Short Term Goal 4 - Progress (Week 1): Met Week 2:  OT Short Term Goal 1 (Week 2): Pt will maintain dynamic standing balance with mod A x1 in preparation for ADL OT Short Term Goal 1 - Progress (Week 2): Met OT Short Term Goal 2 (Week 2): Pt will complete UB dressing with Min A OT Short Term Goal 2 - Progress (Week 2): Met OT Short Term Goal 3 (Week 2): Pt will complete stand-pivot transfer to toilet with min A OT Short Term Goal 3 - Progress (Week 2): Met Week 3:  OT Short Term Goal 1 (Week 3): STG=LTG 2/2 estimated dc date  Skilled Therapeutic Interventions/Progress Updates:    Pt seen this session to focus on LUE NMR.  Pt taken to gym and she transferred to mat with quad cane. Pt worked on various a/arom exercises using table top slides and L hand on handle of cane for shoulder flex/ext and elbow flex/ext.   estim for wrist and finger extensors for 10 min at intensity 21. During estim hand over hand facilitation of grasping a cup and then pt worked on letting go of the cup during the extension phase.   Post estim, hand over hand facilitation with grasp and release of cup with a/arom of pt working on elb flex/ext to bring the cup to her mouth.  With pt's wrist  held in neutral, therapist passively extended fingers and then pt was able to actively flex her fingers to 30-40% of full ROM.  Repeated this exercise 15 x. Pt very excited to see her fingers move.  Pt transferred back to her w/c, taken back to room so she could wash her hands in prep for lunch.  Pt in room with all needs met.  Therapy Documentation Precautions:  Precautions Precautions: Fall Precaution Comments: L hemiplegia Restrictions Weight Bearing Restrictions: No      Pain: Pain Assessment Pain Assessment: No/denies pain Pain Score: 0-No pain ADL: ADL ADL Comments: Please see functional navigator  See Function Navigator for Current Functional Status.   Therapy/Group: Individual Therapy  [place] 05/18/2017, 1:18 PM

## 2017-05-18 NOTE — Progress Notes (Signed)
Recreational Therapy Discharge Summary Patient Details  Name: Donna Rhodes MRN: [id number] Date of Birth: 1946-07-23 Today's Date: 05/18/2017 Comments on progress toward goals: Pt has made good progress during LOS and is extremely motivated to regain further independence.  Pt is anxious for continued motor return and frustrated with slow progress- often times tearful although affect has improved.  Education provided on energy conservation, community pursuits,use of leisure time with activity analysis and potential modifications.  Pt stated understanding.  Pt is discharging home tomorrow with family to provide 24 hour supervision/assistance.   Reasons for discharge: discharge from hospital [place] 05/18/2017, 2:51 PM

## 2017-05-18 NOTE — Progress Notes (Addendum)
Physical Therapy Discharge Summary  Patient Details  Name: Donna Rhodes MRN: [id number] Date of Birth: 1946/02/13  Today's Date: 05/18/2017 PT Individual Time: 0900-1000 PT Individual Time Calculation (min): 60 min    Patient has met 9 of 10 long term goals due to improved activity tolerance, improved balance, improved postural control, increased strength, ability to compensate for deficits, functional use of  left lower extremity, improved attention, improved awareness and improved coordination.  Patient to discharge at a wheelchair level Supervision, expecting to progress to household level ambulatory with min assist with family education.   Patient's care partner present only for OT family education, so able to assist pt with transfers, w/c mobility, and ADLs however would not recommend household ambulation with family until assessed with HHPT.   Reasons goals not met: Pt continues to require steady assist for dynamic standing balance due to decreased limits of stability and impaired postural control   Recommendation:  Patient will benefit from ongoing skilled PT services in home health setting to continue to advance safe functional mobility, address ongoing impairments in balance, coordination, endurance, and strength, and minimize fall risk.  Equipment: 16x16 hemi height w/c, large base quad cane  Reasons for discharge: treatment goals met and discharge from hospital  Patient/family agrees with progress made and goals achieved: Yes   Skilled PT Intervention: Pt with no c/o pain.  Session focus on d/c assessment, functional mobility in controlled and community setting.  Pt requiring steady assist with min verbal cues for gait with LBQC.  Mod I for w/c mobility with R hemi technique and supervision for use of BLEs for reciprocal stepping pattern.  Pt requires up to mod assist for w/c mobility in community setting for safety awareness with accessing curb cuts, and propelling up/down  inclines safely.  Pt requires max multimodal cues for awareness of L foot positioning when propelling in the community.  Pt returned to room at end of session with call bell in reach and needs met.   PT Discharge Precautions/Restrictions Precautions Precautions: Fall Precaution Comments: L hemiplegia Restrictions Weight Bearing Restrictions: No Pain Pain Assessment Pain Assessment: 0-10 Pain Score: 0-No pain Vision/Perception  Perception Perception: Within Functional Limits Praxis Praxis: Intact  Cognition Overall Cognitive Status: Within Functional Limits for tasks assessed Arousal/Alertness: Awake/alert Orientation Level: Oriented X4 Memory: Appears intact Awareness: Appears intact Problem Solving: Appears intact Safety/Judgment: Appears intact Sensation Sensation Light Touch: Appears Intact (LEs intact to LT throughout) Coordination Gross Motor Movements are Fluid and Coordinated: No Fine Motor Movements are Fluid and Coordinated: No Coordination and Movement Description: some persistant LLE flexor tone, worse with fatigue Motor  Motor Motor: Hemiplegia;Abnormal tone Motor - Discharge Observations: some persistant LLE flexor tone, worse with fatigue  Mobility Bed Mobility Supine to Sit: 6: Modified independent (Device/Increase time) Sit to Supine: 6: Modified independent (Device/Increase time) Transfers Transfers: Yes Sit to Stand: 4: Min guard Stand Pivot Transfers: 4: Min Conservation officer, historic buildings Transfers: 5: Supervision Locomotion  Ambulation Ambulation: Yes Ambulation/Gait Assistance: 4: Min guard Ambulation Distance (Feet): 100 Feet Assistive device: Large base quad cane Ambulation/Gait Assistance Details: Tactile cues for weight shifting;Verbal cues for technique;Verbal cues for gait pattern;Verbal cues for precautions/safety Ambulation/Gait Assistance Details: assist to block LLE varus Gait Gait: Yes Gait Pattern: Impaired Gait Pattern: Step-to  pattern;Left steppage;Lateral trunk lean to left;Trunk flexed Stairs / Additional Locomotion Stairs: Yes Stairs Assistance: 4: Min assist Stair Management Technique: One rail Right Number of Stairs: 12 Ramp: 4: Min assist Curb: Not tested (comment) Wheelchair  Mobility Wheelchair Mobility: Yes Wheelchair Assistance: 5: Supervision Wheelchair Propulsion: Right upper extremity;Both lower extermities;Right lower extremity Wheelchair Parts Management: Supervision/cueing Distance: 150  Trunk/Postural Assessment  Cervical Assessment Cervical Assessment: Within Functional Limits Thoracic Assessment Thoracic Assessment: Within Functional Limits Lumbar Assessment Lumbar Assessment: Within Functional Limits Postural Control Postural Control: Deficits on evaluation Protective Responses: delayed and insufficient  Balance Balance Balance Assessed: Yes Dynamic Sitting Balance Dynamic Sitting - Balance Support: Feet supported;During functional activity Dynamic Sitting - Level of Assistance: 6: Modified independent (Device/Increase time) Static Standing Balance Static Standing - Balance Support: Right upper extremity supported Static Standing - Level of Assistance: 5: Stand by assistance Dynamic Standing Balance Dynamic Standing - Balance Support: During functional activity Dynamic Standing - Level of Assistance: 4: Min assist Extremity Assessment      RLE Assessment RLE Assessment: Within Functional Limits LLE Assessment LLE Assessment: Exceptions to [facility] LLE Strength Left Hip Flexion: 3+/5 Left Knee Flexion: 3/5 Left Knee Extension: 4/5 Left Ankle Dorsiflexion: 3+/5 Left Ankle Plantar Flexion: 3+/5   See Function Navigator for Current Functional Status.  Donna Rhodes 05/18/2017, 1:04 PM

## 2017-05-18 NOTE — Progress Notes (Signed)
Physical Therapy Session Note  Patient Details  Name: KEYARI KLEEMAN MRN: [id number] Date of Birth: May 03, 1946  Today's Date: 05/18/2017 PT Individual Time: 1035-1100 PT Individual Time Calculation (min): 25 min   Skilled Therapeutic Interventions/Progress Updates:    Session focused on improving functional mobility.  Pt continues to require min assist for L LE placement and L knee stabilization during gait and stairs.  Pt ambulated approx 15 ft x 2 with [facility] today with min assist from PT to stabilize L knee and to assist in appropriate L foot placement during activity.  Pt additionally requires min assist for L foot placement when ascending and descending stairs as well as assist for L knee stability.  Pt propelled w/c with R UE/LE into gym and return.  Pt left seated up in chair with call bell and phone in reach.    Therapy Documentation Precautions:  Precautions Precautions: Fall Precaution Comments: L hemiplegia Restrictions Weight Bearing Restrictions: No General:    See Function Navigator for Current Functional Status.   Therapy/Group: Individual Therapy  Nishika Parkhurst Hilario Quarry 05/18/2017, 11:15 AM

## 2017-05-18 NOTE — Plan of Care (Signed)
Problem: RH Balance Goal: LTG Patient will maintain dynamic standing balance (PT) LTG:  Patient will maintain dynamic standing balance with assistance during mobility activities (PT)  Outcome: Not Met (add Reason) Continues to require up to min assist for dynamic standing balance

## 2017-05-18 NOTE — Progress Notes (Signed)
Social Work  Discharge Note  The overall goal for the admission was met for:   Discharge location: Yes-HOME WITH DAUGHTER'S PROVIDING 24 HR CARE  Length of Stay: Yes-22 DAYS  Discharge activity level: Yes-MIN ASSIST LEVEL  Home/community participation: Yes  Services provided included: MD, RD, PT, OT, SLP, RN, CM, TR, Pharmacy, Neuropsych and SW  Financial Services: Private Insurance: [employer]  Follow-up services arranged: Home Health: [facility]-PT,OT,RN, DME: [facility], [place] and Patient/Family request agency HH: PREF [facility], DME: NO PREF  Comments (or additional information):DAUGHTER'S WERE HERE DAILY AND PARTICIPATED IN PT'S CARE. ALL COMFORTABLE WITH HER DISCHARGE TODAY. AWARE OF NEED FOR 24 HR CARE AND WILL PROVIDE THIS.  Patient/Family verbalized understanding of follow-up arrangements: Yes  Individual responsible for coordination of the follow-up plan: SELF & DAUGHTER'S-AMY & SUSAN  Confirmed correct DME delivered: Elease Hashimoto 05/18/2017    Elease Hashimoto

## 2017-05-19 MED ORDER — FAMOTIDINE 10 MG PO TABS: 10.0000 mg | ORAL_TABLET | Freq: Two times a day (BID) | ORAL | 0 refills | Status: DC

## 2017-05-19 MED ORDER — AMLODIPINE BESYLATE 5 MG PO TABS: 5.0000 mg | ORAL_TABLET | Freq: Every day | ORAL | 0 refills | Status: DC

## 2017-05-19 MED ORDER — CITALOPRAM HYDROBROMIDE 10 MG PO TABS: 10.0000 mg | ORAL_TABLET | Freq: Every day | ORAL | 0 refills | Status: DC

## 2017-05-19 MED ORDER — ASPIRIN 81 MG PO TBEC: 81.0000 mg | DELAYED_RELEASE_TABLET | Freq: Every day | ORAL | 0 refills | Status: DC

## 2017-05-19 MED ORDER — PANTOPRAZOLE SODIUM 40 MG PO TBEC: 40.0000 mg | DELAYED_RELEASE_TABLET | Freq: Every day | ORAL | 0 refills | Status: DC

## 2017-05-19 MED ORDER — ATORVASTATIN CALCIUM 40 MG PO TABS: 40.0000 mg | ORAL_TABLET | Freq: Every day | ORAL | 0 refills | Status: DC

## 2017-05-19 MED ORDER — LEVOTHYROXINE SODIUM 50 MCG PO TABS: 50.0000 ug | ORAL_TABLET | Freq: Every day | ORAL | 0 refills | Status: DC

## 2017-05-19 MED ORDER — CLOPIDOGREL BISULFATE 75 MG PO TABS: 75.0000 mg | ORAL_TABLET | Freq: Every day | ORAL | 0 refills | Status: DC

## 2017-05-19 NOTE — Progress Notes (Signed)
Pt d/c home with personal belongings and equipment.  Pt and family verbalized an understanding of medication instructions as well as d/c instructions provided by P. Love, PA-C

## 2017-05-19 NOTE — Telephone Encounter (Signed)
Attempted TCM call, left a VM to call back. thanks

## 2017-05-19 NOTE — Progress Notes (Addendum)
Subjective/Complaints:  Pt excited about moving fingers and going home today  ROS: pt denies nausea, vomiting, diarrhea, cough, shortness of breath or chest pain  Objective: Vital Signs: Blood pressure (!) 128/55, pulse 63, temperature 98.1 F (36.7 C), temperature source Oral, resp. rate 17, height '5\' 3"'  (1.6 m), weight 51.3 kg (113 lb), last menstrual period 12/29/1981, SpO2 98 %. No results found. Results for orders placed or performed during the hospital encounter of 04/27/17 (from the past 72 hour(s))  Basic metabolic panel     Status: None   Collection Time: 05/18/17  4:42 AM  Result Value Ref Range   Sodium 140 135 - 145 mmol/L   Potassium 3.6 3.5 - 5.1 mmol/L   Chloride 104 101 - 111 mmol/L   CO2 28 22 - 32 mmol/L   Glucose, Bld 93 65 - 99 mg/dL   BUN 20 6 - 20 mg/dL   Creatinine, Ser 0.81 0.44 - 1.00 mg/dL   Calcium 9.1 8.9 - 10.3 mg/dL   GFR calc non Af Amer >60 >60 mL/min   GFR calc Af Amer >60 >60 mL/min    Comment: (NOTE) The eGFR has been calculated using the CKD EPI equation. This calculation has not been validated in all clinical situations. eGFR's persistently <60 mL/min signify possible Chronic Kidney Disease.    Anion gap 8 5 - 15  CBC     Status: Abnormal   Collection Time: 05/18/17  4:42 AM  Result Value Ref Range   WBC 6.7 4.0 - 10.5 K/uL   RBC 3.87 3.87 - 5.11 MIL/uL   Hemoglobin 11.5 (L) 12.0 - 15.0 g/dL   HCT 37.0 36.0 - 46.0 %   MCV 95.6 78.0 - 100.0 fL   MCH 29.7 26.0 - 34.0 pg   MCHC 31.1 30.0 - 36.0 g/dL   RDW 14.9 11.5 - 15.5 %   Platelets 368 150 - 400 K/uL     HEENT:  Cardio: RRR no JVD Resp: CTA B, normal effort GI: BS positive and ND Skin:   Diffuse maculopapular rash over back and trunk/arms with LESS erythema Neuro: MAS 2 in biceps tone Alert and oriented Motor: LUE 2/5 elbow flexors, triceps LLE: HF, KE 3-/5, ADF 0/PF 2/5 RUE and RLE 5/5 Musc/Skel:  Other Left shoulder subluxation , bilateral hand MCP adn PIP chronic  swelling no acute synovitis Gen NAD. Well-developed. Psych - smiling , brighter affect  Assessment/Plan: 1. Functional deficits secondary to Left hemiparesis  Stable for D/C today F/u PCP in 3-4 weeks F/u PM&R 2 weeks See D/C summary See D/C instructions FIM: Function - Bathing Position: Shower Body parts bathed by patient: Left arm, Right arm, Chest, Abdomen, Front perineal area, Buttocks, Right upper leg, Left upper leg, Right lower leg, Left lower leg Body parts bathed by helper: Right arm Bathing not applicable: Right arm, Back Assist Level: More than reasonable time  Function- Upper Body Dressing/Undressing What is the patient wearing?: Pull over shirt/dress Pull over shirt/dress - Perfomed by patient: Thread/unthread left sleeve, Thread/unthread right sleeve, Put head through opening, Pull shirt over trunk Pull over shirt/dress - Perfomed by helper: Thread/unthread right sleeve, Pull shirt over trunk Assist Level: No help, No cues Set up : To obtain clothing/put away Function - Lower Body Dressing/Undressing What is the patient wearing?: Underwear, Pants, Socks, Shoes Position: Wheelchair/chair at sink Underwear - Performed by patient: Thread/unthread right underwear leg, Thread/unthread left underwear leg, Pull underwear up/down Underwear - Performed by helper: Pull underwear up/down Pants- Performed  by patient: Thread/unthread right pants leg, Thread/unthread left pants leg, Pull pants up/down Pants- Performed by helper: Pull pants up/down Non-skid slipper socks- Performed by patient: Don/doff left sock, Don/doff right sock Non-skid slipper socks- Performed by helper: Don/doff right sock, Don/doff left sock Socks - Performed by patient: Don/doff right sock, Don/doff left sock Shoes - Performed by patient: Don/doff right shoe, Don/doff left shoe Shoes - Performed by helper: Don/doff left shoe Assist for footwear: Setup Assist for lower body dressing: More than reasonable  time  Function - Toileting Toileting steps completed by patient: Adjust clothing prior to toileting, Performs perineal hygiene, Adjust clothing after toileting Toileting steps completed by helper: Adjust clothing prior to toileting, Adjust clothing after toileting Toileting Assistive Devices: Grab bar or rail Assist level: More than reasonable time  Function Midwife transfer assistive device: Grab bar Assist level to toilet: Supervision or verbal cues Assist level from toilet: Supervision or verbal cues Assist level to bedside commode (at bedside): Supervision or verbal cues Assist level from bedside commode (at bedside): 2 helpers  Function - Chair/bed transfer Chair/bed transfer method: Stand pivot, Squat pivot Chair/bed transfer assist level: Supervision or verbal cues Chair/bed transfer assistive device: Armrests, Cane Chair/bed transfer details: Tactile cues for weight shifting, Verbal cues for technique, Verbal cues for sequencing  Function - Locomotion: Wheelchair Will patient use wheelchair at discharge?: Yes Type: Manual Max wheelchair distance: 150 Assist Level: No help, No cues, assistive device, takes more than reasonable amount of time Assist Level: No help, No cues, assistive device, takes more than reasonable amount of time Assist Level: No help, No cues, assistive device, takes more than reasonable amount of time Turns around,maneuvers to table,bed, and toilet,negotiates 3% grade,maneuvers on rugs and over doorsills: No Function - Locomotion: Ambulation Assistive device: Cane-quad Max distance: 100 Assist level: Touching or steadying assistance (Pt > 75%) Assist level: Touching or steadying assistance (Pt > 75%) Walk 50 feet with 2 turns activity did not occur: Safety/medical concerns Assist level: Touching or steadying assistance (Pt > 75%) Walk 150 feet activity did not occur: Safety/medical concerns Walk 10 feet on uneven surfaces activity did  not occur: Safety/medical concerns Assist level: Moderate assist (Pt 50 - 74%)  Function - Comprehension Comprehension: Auditory Comprehension assist level: Follows complex conversation/direction with no assist  Function - Expression Expression: Verbal Expression assist level: Expresses complex ideas: With no assist  Function - Social Interaction Social Interaction assist level: Interacts appropriately with others - No medications needed.  Function - Problem Solving Problem solving assist level: Solves complex problems: Recognizes & self-corrects  Function - Memory Memory assist level: Complete Independence: No helper Patient normally able to recall (first 3 days only): Current season, Location of own room, Staff names and faces, That he or she is in a hospital   Medical Problem List and Plan: 1. 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 DVT Prophylaxis/Anticoagulation: Pharmaceutical: Lovenox 3. Pain Management: tylenol prn 4. Mood: Seems flat--question fatigue. Team to provide ego support. LCSW to follow for evaluation and support.  -neuropsych following trial SSRI, celexa 72m, improving affect 5. Neuropsych: This patient iscapable of making decisions on herown behalf.  6. Skin/Wound Care: diffuse maculopapular rash--resolved on Medrol Dosepak  -likely from antibiotics --cefepime cross reactivity, hx amox allergy, Will list  -Off medrol dosepack  -observe for further resolution  -hydrocortisone cream prn 7. Fluids/Electrolytes/Nutrition: Monitor I/O. BMET normal, enc fluids  8. HTN: [place] BP bid.  On Norvasc daily   Controlled 5/23 Vitals:   05/18/17 1422 05/19/17 0415  BP: (!) 147/67 (!) 128/55  Pulse: 67 63  Resp: 18 17  Temp: 98.2 F (36.8 C) 98.1 F (36.7 C)   9. Dyslipidemia: Continue Lipitor.  10. Hypothyroid: On supplement 11. RA: Managed with methotrexate injections  weekly.Usually administers them to herself at home now unable to due to CVA, per hosp policy pt on chemo prec even though not needed in outpt setting.    12.  Hypoalb- t prostat   LOS (Days) 22 A FACE TO FACE EVALUATION WAS PERFORMED  Lexa Coronado E 05/19/2017, 8:26 AM

## 2017-05-19 NOTE — Discharge Instructions (Signed)
Inpatient Rehab Discharge Instructions  NATLIE ASFOUR Discharge date and time:  05/19/17  Activities/Precautions/ Functional Status: Activity: no lifting, driving, or strenuous exercise till cleared by MD.  Diet: low fat, low cholesterol diet Wound Care: none needed   Functional status:  ___ No restrictions     ___ Walk up steps independently _X__ 24/7 supervision/assistance   ___ Walk up steps with assistance ___ Intermittent supervision/assistance  ___ Bathe/dress independently ___ Walk with walker     ___ Bathe/dress with assistance ___ Walk Independently    ___ Shower independently ___ Walk with assistance    _X__ Shower with assistance _X__ No alcohol     ___ Return to work/school ________  Special Instructions:    COMMUNITY REFERRALS UPON DISCHARGE:    Home Health:   PT, OT, RN  [place]   Date of last service:05/19/2017  Medical Equipment/Items Ordered:WHEELCHAIR, [place]   [phone]   GENERAL COMMUNITY RESOURCES FOR PATIENT/FAMILY: Support Groups:[facility] SUPPORT GROUP EVERY SECOND Thursday @ 3:00-4:00 PM ON THE REHAB UNIT QUESTIONS CONTACT [facility] [phone]   STROKE/TIA DISCHARGE INSTRUCTIONS SMOKING Cigarette smoking nearly doubles your risk of having a stroke & is the single most alterable risk factor  If you smoke or have smoked in the last 12 months, you are advised to quit smoking for your health.  Most of the excess cardiovascular risk related to smoking disappears within a year of stopping.  Ask you doctor about anti-smoking medications  [place] Quit Line: 1-800-QUIT NOW  Free Smoking Cessation Classes [phone]  CHOLESTEROL Know your levels; limit fat & cholesterol in your diet  Lipid Panel     Component Value Date/Time   CHOL 203 (H) 04/26/2017 0430   TRIG 88 04/26/2017 0430   HDL 54 04/26/2017 0430   CHOLHDL 3.8 04/26/2017 0430   VLDL 18 04/26/2017 0430     LDLCALC 131 (H) 04/26/2017 0430      Many patients benefit from treatment even if their cholesterol is at goal.  Goal: Total Cholesterol (CHOL) less than 160  Goal:  Triglycerides (TRIG) less than 150  Goal:  HDL greater than 40  Goal:  LDL (LDLCALC) less than 100   BLOOD PRESSURE American Stroke Association blood pressure target is less that 120/80 mm/Hg  Your discharge blood pressure is:  BP: (!) 128/55  Monitor your blood pressure  Limit your salt and alcohol intake  Many individuals will require more than one medication for high blood pressure  DIABETES (A1c is a blood sugar average for last 3 months) Goal HGBA1c is under 7% (HBGA1c is blood sugar average for last 3 months)  Diabetes: No known diagnosis of diabetes    Lab Results  Component Value Date   HGBA1C 5.7 (H) 04/25/2017     Your HGBA1c can be lowered with medications, healthy diet, and exercise.  Check your blood sugar as directed by your physician  Call your physician if you experience unexplained or low blood sugars.  PHYSICAL ACTIVITY/REHABILITATION Goal is 30 minutes at least 4 days per week  Activity: No driving, Therapies: See above Return to work: N/A  Activity decreases your risk of heart attack and stroke and makes your heart stronger.  It helps control your weight and blood pressure; helps you relax and can improve your mood.  Participate in a regular exercise program.  Talk with your doctor about the best form of exercise for you (dancing, walking, swimming, cycling).  DIET/WEIGHT Goal is  to maintain a healthy weight  Your discharge diet is: Diet Heart Room service appropriate? Yes; Fluid consistency: Thin liquids Your height is:  Height: 5\' 3"  (160 cm) Your current weight is: Weight: 51.3 kg (113 lb) Your Body Mass Index (BMI) is:  BMI (Calculated): 21.6  Following the type of diet specifically designed for you will help prevent another stroke.  You are at goal weight   Your goal Body  Mass Index (BMI) is 19-24.  Healthy food habits can help reduce 3 risk factors for stroke:  High cholesterol, hypertension, and excess weight.  RESOURCES Stroke/Support Group:  Call [phone]   STROKE EDUCATION PROVIDED/REVIEWED AND GIVEN TO PATIENT Stroke warning signs and symptoms How to activate emergency medical system (call 911). Medications prescribed at discharge. Need for follow-up after discharge. Personal risk factors for stroke. Pneumonia vaccine given:  Flu vaccine given:  My questions have been answered, the writing is legible, and I understand these instructions.  I will adhere to these goals & educational materials that have been provided to me after my discharge from the hospital.     My questions have been answered and I understand these instructions. I will adhere to these goals and the provided educational materials after my discharge from the hospital.  Patient/Caregiver Signature _______________________________ Date __________  Clinician Signature _______________________________________ Date __________  Please bring this form and your medication list with you to all your follow-up doctor's appointments.

## 2017-05-19 NOTE — Discharge Summary (Signed)
Physician Discharge Summary  Patient ID: Donna Rhodes MRN: [id number] DOB/AGE: 72/26/1947 71 y.o.  Admit date: 04/27/2017 Discharge date: 05/19/2017  Discharge Diagnoses:  Principal Problem:   Right-sided lacunar infarction [facility]) Active Problems:   Essential hypertension   Hypercholesterolemia   GERD (gastroesophageal reflux disease)   Rheumatoid arthritis involving both hands (HCC)   Flaccid monoplegia of upper extremity (HCC)   Oral thrush   Benign essential HTN   Hypokalemia   HCAP (healthcare-associated pneumonia)   Adjustment disorder with mixed anxiety and depressed mood   Discharged Condition: stable   Significant Diagnostic Studies: Dg Chest 2 View  Result Date: 05/01/2017 CLINICAL DATA:  Cough with fever. EXAM: CHEST  2 VIEW COMPARISON:  04/27/2017 chest radiograph. FINDINGS: The cardiomediastinal silhouette is unremarkable. Right lower lobe airspace disease is noted compatible with pneumonia. The left lung is clear. There is no evidence of pleural effusion, pneumothorax or acute bony abnormality. IMPRESSION: Right lower lobe airspace disease compatible with pneumonia. Radiographic follow-up to resolution is recommended. Electronically Signed   By: Margarette Canada M.D.   On: 05/01/2017 17:05   Dg Chest 2 View  Result Date: 04/27/2017 CLINICAL DATA:  Cough since last night. EXAM: CHEST  2 VIEW COMPARISON:  Chest CT 09/23/2010 FINDINGS: Lungs are adequately inflated without consolidation or effusion. Cardiomediastinal silhouette is within normal bones and soft tissues are unremarkable. IMPRESSION: No active cardiopulmonary disease. Electronically Signed   By: Marin Olp M.D.   On: 04/27/2017 18:09    Labs:  Basic Metabolic Panel: BMP Latest Ref Rng & Units 05/18/2017 05/11/2017 05/04/2017  Glucose 65 - 99 mg/dL 93 93 120(H)  BUN 6 - 20 mg/dL 20 17 15   Creatinine 0.44 - 1.00 mg/dL 0.81 0.61 0.67  Sodium 135 - 145 mmol/L 140 140 135  Potassium 3.5 - 5.1 mmol/L 3.6 3.8 3.8   Chloride 101 - 111 mmol/L 104 105 102  CO2 22 - 32 mmol/L 28 27 25   Calcium 8.9 - 10.3 mg/dL 9.1 9.0 8.4(L)    CBC: CBC Latest Ref Rng & Units 05/18/2017 05/11/2017 05/04/2017  WBC 4.0 - 10.5 K/uL 6.7 10.2 8.0  Hemoglobin 12.0 - 15.0 g/dL 11.5(L) 11.4(L) 10.5(L)  Hematocrit 36.0 - 46.0 % 37.0 35.9(L) 33.4(L)  Platelets 150 - 400 K/uL 368 471(H) 301    CBG: No results for input(s): GLUCAP in the last 168 hours.  Brief HPI:   Donna Rhodes an 71 y.o.LH femalewith history of HTN, GERD, RA, lung nodule who was admitted to [facility] on 04/24/17 with multiple falls and MRI brain without acute findings and cervical spondylosis with central protrusion at C4/5. She was sent home and started developing left sided weakness withleft facial weakness and slurred speech. 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 MRA brain/neck was negative for occlusion or stenosis. 2D echo with EF 50-55% and grade 1 diastolic dysfunction. Stroke felt to be due to small vessel disease and Dr. Doy Mince recommended low dose ASA and plavix for secondary stroke prevention. She did have increase in weakness LUE on 4/30 and was treated with fluid bolus. She was started on dysphagia 3, thin liquids with aspiration precautions and educated on OME. Patient with resultant left sided weakness, right lean and decreased in ability to WB on LLE. She has had significant decline in mobility and ability to carry out ADL tasks. CIR recommended for follow up therapy.    Hospital Course: Donna Rhodes was admitted to rehab 04/27/2017 for inpatient therapies to consist of PT, ST and OT at least three hours five days a week. Past admission physiatrist, therapy team and rehab RN have worked together to provide customized collaborative inpatient rehab. She reported episode of chocking on meds the nights before with onset of cough therefore CXR was done past  admission and was negative. Swallow evaluation showed mild pocketing but no signs of dysphagia. She did develop fever with SOB on 5/5 and was found to have RLL PNA. She was treated with a week course of Vancomycin and cefepime with resolution of fevers and improvement in respiratory symptoms. She did develop diffuse macular rash question due to cross reaction from cefepime and prednisone X 2 dose was ineffective therefore was started on medrol dose pack with resolution of rash.  Her blood pressures have been stable on norvasc.  She has had flat affect with evidence of adjustment disorder. Neuropsychologist has been following for support and Celexa was added for mood stabilization.     BLE dopplers were negative for DVT and Sq lovenox was use for DVT prophylaxis. Oral thrush has resolved with use of nystatin mouthwash and diflucan. She continues on ASA/Plavix for secondary stroke prevention. Her po intake has improved and serial monitoring of lytes shows renal status is stable. Follow up CBC showed improvement in H/H.  Due to dense left hemiparesis; resting hand splint was used at bedtime and Aircast was used during the day for support of left ankle during mobility.  She has made steady progress during her rehab stay and is currently at min assist level. She will continue to receive follow up [place], [place] and [place] by [place] after discharge.    Rehab course: During patient's stay in rehab weekly team conferences were held to monitor patient's progress, set goals and discuss barriers to discharge. She required mod to max assist with basic self care tasks and max assist with mobility. She exhibited mild cognitive impairments with left inattention affecting functional tasks as well as mil deficits in complex problem solving. She has had improvement in activity tolerance, balance, postural control, as well as ability to compensate for deficits. She is has had improvement in functional use LUE  and LLE as  well as improved awareness She is requires set up assist but is able to complete dressing tasks at modified independent level and requires suprevision with bathing. She is able to perform transfers and ambulate 15' with min assist. She requires steady assist for dynamic standing balance. She is able to propel her wheelchair at modified independent in supervised setting. Family education was completed regarding all aspects of care and her  daughters will assist as needed after discharge.    Disposition: 01-Home or Self Care  Diet: Heart healthy.  Special Instructions: 1. No strenuous activity. No driving.    Discharge Instructions    Ambulatory referral to Physical Medicine Rehab    Complete by:  As directed    1-2 weeks transitional care appt     Allergies as of 05/19/2017      Reactions   Amoxicillin Other (See Comments)   Irritated tongue   Avelox [moxifloxacin Hcl In Nacl]    Azithromycin Other (See Comments)   Caused mouth and tongue to be sore   Codeine    [place] Weight Management [nutritional Supplements]    Mouth soreness   Food    Walnuts --mouth breaks out   Moxifloxacin Other (See Comments)   Naproxen  Cefepime Rash      Medication List    STOP taking these medications   esomeprazole 40 MG capsule Commonly known as:  [place] by:  pantoprazole 40 MG tablet   ESTRACE VAGINAL 0.1 MG/GM vaginal cream Generic drug:  estradiol     TAKE these medications   amLODipine 5 MG tablet Commonly known as:  NORVASC Take 1 tablet (5 mg total) by mouth daily.   aspirin 81 MG EC tablet Take 1 tablet (81 mg total) by mouth daily.   atorvastatin 40 MG tablet Commonly known as:  LIPITOR Take 1 tablet (40 mg total) by mouth daily at 6 PM.   calcium-vitamin D 250-125 MG-UNIT tablet Commonly known as:  OSCAL WITH D Take 1 tablet by mouth daily.   citalopram 10 MG tablet Commonly known as:  CELEXA Take 1 tablet (10 mg total) by mouth daily. Start taking on:   05/20/2017   clopidogrel 75 MG tablet Commonly known as:  PLAVIX Take 1 tablet (75 mg total) by mouth daily.   famotidine 10 MG tablet Commonly known as:  PEPCID Take 1 tablet (10 mg total) by mouth 2 (two) times daily.   folic acid 1 MG tablet Commonly known as:  FOLVITE Take 1 mg by mouth daily.   levothyroxine 50 MCG tablet Commonly known as:  SYNTHROID, LEVOTHROID Take 1 tablet (50 mcg total) by mouth daily before breakfast. What changed:  when to take this   methotrexate 50 MG/2ML injection Inject 0.15mL under the skin once a week.   pantoprazole 40 MG tablet Commonly known as:  PROTONIX Take 1 tablet (40 mg total) by mouth daily. Start taking on:  05/20/2017 Replaces:  esomeprazole 40 MG capsule      Follow-up Information    Kirsteins, Luanna Salk, MD Follow up.   Specialty:  Physical Medicine and Rehabilitation Why:  office will call you with follow up appointment Contact information: [place] [address] [phone]        Einar Pheasant, MD Follow up on 05/26/2017.   Specialty:  Internal Medicine Why:  Appointment @ 1:00 PM. Discuss referral to neurologist in town for follow up.  Contact information: [address] [place] [address] [phone]           Signed: Bary Leriche 05/19/2017, 5:45 PM

## 2017-05-20 ENCOUNTER — Other Ambulatory Visit: Payer: Self-pay

## 2017-05-20 NOTE — Telephone Encounter (Signed)
Patient called and left message stating that she was released from the hospital today and recieved all but 1 of her medication refills, calcium-vitamin d, wants to know if this can be refilled/prescribed for her.

## 2017-05-20 NOTE — Telephone Encounter (Signed)
Calcium with Vitamin D was listed as her home med and is OTC.

## 2017-05-20 NOTE — Telephone Encounter (Signed)
Transition Care Management Follow-up Telephone Call  How have you been since you were released from the hospital? Pretty good for what happened. Daughter is staying with her right now.    Do you understand why you were in the hospital? Yes, had a stroke   Do you understand the discharge instrcutions? Daughter with her till Tuesday then has intermittent help. No questions regarding the discharge paperwork.   Items Reviewed:  Medications reviewed: 4 or 5 added,no questions with them, needs her vitamin D and calcium refilled but has a call into another provider to do that first, will let us no if she needs anything  Allergies reviewed: yes, no changes  Dietary changes reviewed: yes, no changes  Referrals reviewed: Dr. Nicki Reaper the 30th, neurologist, cardiologist needs to call for appt date and times.    Functional Questionnaire:   Activities of Daily Living (ADLs):   She states they are independent in the following: Wheel chair, four leg cane, brace for her ankle tends to turn her ankle over, lose her balance sometimes.   States they require assistance with the following:    Any transportation issues/concerns?: daughter to bring to the appt.    Any patient concerns? none   Confirmed importance and date/time of follow-up visits scheduled: 05/26/17 with Dr. Nicki Reaper.    Confirmed with patient if condition begins to worsen call PCP or go to the ER.  Patient was given the Call-a-Nurse line [phone] and agreed.

## 2017-05-20 NOTE — Telephone Encounter (Signed)
Pt called back returning your call. Please advise, thank you!  Call pt @ [phone]

## 2017-05-21 DIAGNOSIS — I69354 Hemiplegia and hemiparesis following cerebral infarction affecting left non-dominant side: Secondary | ICD-10-CM | POA: Diagnosis not present

## 2017-05-21 DIAGNOSIS — I10 Essential (primary) hypertension: Secondary | ICD-10-CM | POA: Diagnosis not present

## 2017-05-21 DIAGNOSIS — Z7902 Long term (current) use of antithrombotics/antiplatelets: Secondary | ICD-10-CM | POA: Diagnosis not present

## 2017-05-21 DIAGNOSIS — K219 Gastro-esophageal reflux disease without esophagitis: Secondary | ICD-10-CM | POA: Diagnosis not present

## 2017-05-21 DIAGNOSIS — Z7982 Long term (current) use of aspirin: Secondary | ICD-10-CM | POA: Diagnosis not present

## 2017-05-21 DIAGNOSIS — Z87891 Personal history of nicotine dependence: Secondary | ICD-10-CM | POA: Diagnosis not present

## 2017-05-21 DIAGNOSIS — M069 Rheumatoid arthritis, unspecified: Secondary | ICD-10-CM | POA: Diagnosis not present

## 2017-05-21 NOTE — Telephone Encounter (Signed)
Contacted patient and informed her of what Pam stated.

## 2017-05-26 ENCOUNTER — Other Ambulatory Visit: Payer: Self-pay | Admitting: Internal Medicine

## 2017-05-26 ENCOUNTER — Encounter: Payer: Self-pay | Admitting: Internal Medicine

## 2017-05-26 ENCOUNTER — Ambulatory Visit (INDEPENDENT_AMBULATORY_CARE_PROVIDER_SITE_OTHER): Payer: PPO | Admitting: Internal Medicine

## 2017-05-26 ENCOUNTER — Ambulatory Visit (INDEPENDENT_AMBULATORY_CARE_PROVIDER_SITE_OTHER): Payer: PPO

## 2017-05-26 VITALS — BP 118/62 | HR 74 | Temp 98.4°F | Resp 16 | Ht 63.0 in | Wt 115.8 lb

## 2017-05-26 DIAGNOSIS — G8320 Monoplegia of upper limb affecting unspecified side: Secondary | ICD-10-CM

## 2017-05-26 DIAGNOSIS — J189 Pneumonia, unspecified organism: Secondary | ICD-10-CM

## 2017-05-26 DIAGNOSIS — M069 Rheumatoid arthritis, unspecified: Secondary | ICD-10-CM | POA: Diagnosis not present

## 2017-05-26 DIAGNOSIS — E7800 Pure hypercholesterolemia, unspecified: Secondary | ICD-10-CM

## 2017-05-26 DIAGNOSIS — F4323 Adjustment disorder with mixed anxiety and depressed mood: Secondary | ICD-10-CM | POA: Diagnosis not present

## 2017-05-26 DIAGNOSIS — B370 Candidal stomatitis: Secondary | ICD-10-CM | POA: Diagnosis not present

## 2017-05-26 DIAGNOSIS — R9389 Abnormal findings on diagnostic imaging of other specified body structures: Secondary | ICD-10-CM

## 2017-05-26 DIAGNOSIS — I639 Cerebral infarction, unspecified: Secondary | ICD-10-CM

## 2017-05-26 DIAGNOSIS — K219 Gastro-esophageal reflux disease without esophagitis: Secondary | ICD-10-CM | POA: Diagnosis not present

## 2017-05-26 DIAGNOSIS — I69354 Hemiplegia and hemiparesis following cerebral infarction affecting left non-dominant side: Secondary | ICD-10-CM

## 2017-05-26 DIAGNOSIS — I10 Essential (primary) hypertension: Secondary | ICD-10-CM

## 2017-05-26 MED ORDER — NYSTATIN 100000 UNIT/ML MT SUSP: OROMUCOSAL | 0 refills | Status: DC

## 2017-05-26 NOTE — Progress Notes (Signed)
Patient ID: WALSIE SMELTZ, female   DOB: January 18, 1946, 71 y.o.   MRN: [id number]   Subjective:    Patient ID: [name], female    DOB: 01/30/1946, 71 y.o.   MRN: [id number]  HPI  Patient here for hospital follow up.  She is accompanied by her daughter (Amy).  History obtained from both of them.  She was admitted 04/24/17 and diagnosed with CVA - with resulting left arm and leg weakness.  (right side lacunar infarction).  MRI neck negative for stenosis.  ECHO EF 5%.  On plavix and aspirin.  Was sent to rehab at [facility].  Had PT/OT and was discharged to home 05/19/17.  She continues with home PT.  Ambulating with a cane.  While in rehab, was diagnosed with pneumonia.  Needs f/u cxr to confirm complete clearance.  Reports no increased cough or congestion now.  No sob.  No chest pain.  Occasionally will notice a fluttering sensation - but not as bad as prior to her hospitalization.  Eating better.  No nausea or vomiting.  No acid reflux.  Does report a bad taste in her mouth. Bowels moving.     Past Medical History:  Diagnosis Date  . Atrophic vaginitis   . Dysplasia of vagina    vin 1 - vulva and vaginal cuff  . GERD (gastroesophageal reflux disease)   . HCV infection   . HSV (herpes simplex virus) infection   . Hypercholesterolemia   . Hypertension   . Hypothyroidism   . Lung nodule    stable  . Menopausal state   . Osteoarthritis    hand involvement, cervical disc disease  . Osteoporosis   . Pelvic pain in female   . Rheumatoid arthritis ([place])   . 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 Thyroid nodule   . Vaginal Pap smear, abnormal    lgsil   Past Surgical History:  Procedure Laterality Date  . ADENOIDECTOMY    . APPENDECTOMY  1965  . [place]   left  . CHOLECYSTECTOMY    . [place]  . laser vein surgery    . MANDIBLE RECONSTRUCTION  1992  . TOENAIL EXCISION  1998   several  . [place]  . VAGINAL HYSTERECTOMY  1983   als0-  anterior/posterior colporrhaphy  . VAGINAL WOUND CLOSURE / REPAIR  1999, 2000  . VAGINAL WOUND CLOSURE / REPAIR     Family History  Problem Relation Age of Onset  . Hypertension Father   . Osteoarthritis Mother   . Arthritis/Rheumatoid Sister   . Breast cancer Unknown        paternal side  . Breast cancer Paternal Aunt   . Breast cancer Cousin        maternal  . Uterine cancer Maternal Grandmother   . Ovarian cancer Neg Hx   . Heart disease Neg Hx   . Diabetes Neg Hx   . Colon cancer Neg Hx    Social History   Social History  . Marital status: Divorced    Spouse name: N/A  . Number of children: 3  . Years of education: N/A   Social History Main Topics  . Smoking status: Former Smoker    Quit date: 12/29/1979  . Smokeless tobacco: Never Used  . Alcohol use No  . Drug use: No  . Sexual activity: Not Currently    Birth control/ protection: Surgical   Other Topics Concern  . None  Social History Narrative  . None    Outpatient Encounter Prescriptions as of 05/26/2017  Medication Sig  . amLODipine (NORVASC) 5 MG tablet Take 1 tablet (5 mg total) by mouth daily.  Marland Kitchen aspirin 81 MG EC tablet Take 1 tablet (81 mg total) by mouth daily.  Marland Kitchen atorvastatin (LIPITOR) 40 MG tablet Take 1 tablet (40 mg total) by mouth daily at 6 PM.  . calcium-vitamin D (OSCAL WITH D) 250-125 MG-UNIT tablet Take 1 tablet by mouth daily.  . citalopram (CELEXA) 10 MG tablet Take 1 tablet (10 mg total) by mouth daily.  . clopidogrel (PLAVIX) 75 MG tablet Take 1 tablet (75 mg total) by mouth daily.  . famotidine (PEPCID) 10 MG tablet Take 1 tablet (10 mg total) by mouth 2 (two) times daily.  . folic acid (FOLVITE) 1 MG tablet Take 1 mg by mouth daily.  Marland Kitchen levothyroxine (SYNTHROID, LEVOTHROID) 50 MCG tablet Take 1 tablet (50 mcg total) by mouth daily before breakfast.  . methotrexate 50 MG/2ML injection Inject 0.65mL under the skin once a week.  . pantoprazole (PROTONIX) 40 MG tablet Take 1 tablet (40 mg  total) by mouth daily.  Marland Kitchen nystatin (MYCOSTATIN) 100000 UNIT/ML suspension 5 cc's swish and spit tid   No facility-administered encounter medications on file as of 05/26/2017.     Review of Systems  Constitutional:       Has had decreased appetite.  Improved some recently.    HENT: Negative for congestion and sinus pressure.   Respiratory: Negative for cough, chest tightness and shortness of breath.   Cardiovascular: Negative for chest pain and leg swelling.  Gastrointestinal: Negative for abdominal pain, diarrhea, nausea and vomiting.  Genitourinary: Negative for difficulty urinating and dysuria.  Musculoskeletal: Negative for back pain and joint swelling.  Skin: Negative for color change and rash.  Neurological: Negative for dizziness, light-headedness and headaches.       Persistent leg and arm weakness (left).    Psychiatric/Behavioral: Negative for agitation.       Crying spells.  Overall doing better since on citalopram.         Objective:    Physical Exam  Constitutional: She appears well-developed and well-nourished. No distress.  HENT:  Nose: Nose normal.  Mouth/Throat: Oropharynx is clear and moist.  Neck: Neck supple. No thyromegaly present.  Cardiovascular: Normal rate and regular rhythm.   Pulmonary/Chest: Breath sounds normal. No respiratory distress. She has no wheezes.  Abdominal: Soft. Bowel sounds are normal. There is no tenderness.  Musculoskeletal: She exhibits no edema or tenderness.  Able to move left arm - more from her shoulder, but is able to raise forearm slightly.  Able to move fingers slightly.  Left leg weakness.    Lymphadenopathy:    She has no cervical adenopathy.  Skin: No rash noted. No erythema.  Psychiatric: She has a normal mood and affect. Her behavior is normal.    BP 118/62   Pulse 74   Temp 98.4 F (36.9 C) (Oral)   Resp 16   Ht 5\' 3"  (1.6 m)   Wt 115 lb 12 oz (52.5 kg)   LMP 12/29/1981   SpO2 95%   BMI 20.50 kg/m  Wt  Readings from Last 3 Encounters:  05/26/17 115 lb 12 oz (52.5 kg)  05/17/17 113 lb (51.3 kg)  04/27/17 134 lb 1.6 oz (60.8 kg)     Lab Results  Component Value Date   WBC 6.7 05/18/2017   HGB 11.5 (L) 05/18/2017  HCT 37.0 05/18/2017   PLT 368 05/18/2017   GLUCOSE 93 05/18/2017   CHOL 203 (H) 04/26/2017   TRIG 88 04/26/2017   HDL 54 04/26/2017   LDLDIRECT 128.0 05/25/2013   LDLCALC 131 (H) 04/26/2017   ALT 28 04/28/2017   AST 33 04/28/2017   NA 140 05/18/2017   K 3.6 05/18/2017   CL 104 05/18/2017   CREATININE 0.81 05/18/2017   BUN 20 05/18/2017   CO2 28 05/18/2017   TSH 3.344 04/24/2017   HGBA1C 5.7 (H) 04/25/2017    Dg Chest 2 View  Result Date: 04/27/2017 CLINICAL DATA:  Cough since last night. EXAM: CHEST  2 VIEW COMPARISON:  Chest CT 09/23/2010 FINDINGS: Lungs are adequately inflated without consolidation or effusion. Cardiomediastinal silhouette is within normal bones and soft tissues are unremarkable. IMPRESSION: No active cardiopulmonary disease. Electronically Signed   By: Marin Olp M.D.   On: 04/27/2017 18:09       Assessment & Plan:   Problem List Items Addressed This Visit    Adjustment disorder with mixed anxiety and depressed mood    Now on citalopram.  Daughter reports doing better.  Still with crying spells.  Follow.        CVA (cerebral vascular accident) [facility])    Recently admitted with CVA as outlined.  Residual left arm and leg weakness.  Discharged from rehab 05/19/17.  Home PT now.  Continue on aspirin and plavix.  W/up as outlined.        Essential hypertension    Blood pressure has been under good control.  Continue same medication regimen.  Follow pressures.  Follow metabolic panel.        Flaccid monoplegia of upper extremity (HCC)    Undergoing continued physical therapy (home).        GERD (gastroesophageal reflux disease)    Controlled on protonix.        HCAP (healthcare-associated pneumonia) - Primary    Diagnosed in  hospital.  Cough resolved.  Recheck cxr today.        Relevant Medications   nystatin (MYCOSTATIN) 100000 UNIT/ML suspension   Other Relevant Orders   DG Chest 2 View (Completed)   Hypercholesterolemia    On lipitor.  Follow lipid panel and liver function tests.        Oral thrush    Present on exam.  Feel contributing to bad taste.  Nystatin suspension.        Relevant Medications   nystatin (MYCOSTATIN) 100000 UNIT/ML suspension   Rheumatoid arthritis involving both hands ([place])    Followed by Dr Jefm Bryant.  On MTX.            Einar Pheasant, MD

## 2017-05-26 NOTE — Progress Notes (Signed)
Order placed for f/u cxr.  

## 2017-05-27 ENCOUNTER — Encounter: Payer: Self-pay | Admitting: Internal Medicine

## 2017-05-27 NOTE — Assessment & Plan Note (Signed)
Recently admitted with CVA as outlined.  Residual left arm and leg weakness.  Discharged from rehab 05/19/17.  Home PT now.  Continue on aspirin and plavix.  W/up as outlined.

## 2017-05-27 NOTE — Assessment & Plan Note (Signed)
Controlled on protonix.   

## 2017-05-27 NOTE — Assessment & Plan Note (Signed)
On lipitor.  Follow lipid panel and liver function tests.   

## 2017-05-27 NOTE — Assessment & Plan Note (Signed)
Diagnosed in hospital.  Cough resolved.  Recheck cxr today.

## 2017-05-27 NOTE — Assessment & Plan Note (Signed)
Followed by Dr Kernodle.  On MTX.   

## 2017-05-27 NOTE — Assessment & Plan Note (Signed)
Now on citalopram.  Daughter reports doing better.  Still with crying spells.  Follow.

## 2017-05-27 NOTE — Assessment & Plan Note (Signed)
Blood pressure has been under good control.  Continue same medication regimen.  Follow pressures.  Follow metabolic panel.   

## 2017-05-27 NOTE — Assessment & Plan Note (Signed)
Undergoing continued physical therapy (home).

## 2017-05-27 NOTE — Assessment & Plan Note (Signed)
Present on exam.  Feel contributing to bad taste.  Nystatin suspension.

## 2017-05-31 ENCOUNTER — Ambulatory Visit (HOSPITAL_BASED_OUTPATIENT_CLINIC_OR_DEPARTMENT_OTHER): Payer: PPO | Admitting: Physical Medicine & Rehabilitation

## 2017-05-31 ENCOUNTER — Encounter: Payer: PPO | Attending: Physical Medicine & Rehabilitation

## 2017-05-31 ENCOUNTER — Encounter: Payer: Self-pay | Admitting: Physical Medicine & Rehabilitation

## 2017-05-31 VITALS — BP 120/74 | HR 63 | Resp 14

## 2017-05-31 DIAGNOSIS — E785 Hyperlipidemia, unspecified: Secondary | ICD-10-CM | POA: Diagnosis not present

## 2017-05-31 DIAGNOSIS — R269 Unspecified abnormalities of gait and mobility: Secondary | ICD-10-CM

## 2017-05-31 DIAGNOSIS — M069 Rheumatoid arthritis, unspecified: Secondary | ICD-10-CM | POA: Diagnosis not present

## 2017-05-31 DIAGNOSIS — Z7902 Long term (current) use of antithrombotics/antiplatelets: Secondary | ICD-10-CM | POA: Diagnosis not present

## 2017-05-31 DIAGNOSIS — I69354 Hemiplegia and hemiparesis following cerebral infarction affecting left non-dominant side: Secondary | ICD-10-CM | POA: Insufficient documentation

## 2017-05-31 DIAGNOSIS — K219 Gastro-esophageal reflux disease without esophagitis: Secondary | ICD-10-CM | POA: Diagnosis not present

## 2017-05-31 DIAGNOSIS — I639 Cerebral infarction, unspecified: Secondary | ICD-10-CM

## 2017-05-31 DIAGNOSIS — M7989 Other specified soft tissue disorders: Secondary | ICD-10-CM | POA: Insufficient documentation

## 2017-05-31 DIAGNOSIS — R531 Weakness: Secondary | ICD-10-CM | POA: Insufficient documentation

## 2017-05-31 DIAGNOSIS — I69398 Other sequelae of cerebral infarction: Secondary | ICD-10-CM

## 2017-05-31 DIAGNOSIS — G8114 Spastic hemiplegia affecting left nondominant side: Secondary | ICD-10-CM | POA: Insufficient documentation

## 2017-05-31 DIAGNOSIS — Z7982 Long term (current) use of aspirin: Secondary | ICD-10-CM | POA: Insufficient documentation

## 2017-05-31 DIAGNOSIS — Z87891 Personal history of nicotine dependence: Secondary | ICD-10-CM | POA: Diagnosis not present

## 2017-05-31 DIAGNOSIS — I10 Essential (primary) hypertension: Secondary | ICD-10-CM | POA: Insufficient documentation

## 2017-05-31 NOTE — Patient Instructions (Addendum)
Continue to use Air cast you may need an AFO if the ankle weakness persists  Cont to massage from Left fingers to wrist 100 strokes per day  Neurology office will call you

## 2017-05-31 NOTE — Progress Notes (Signed)
Subjective:    Patient ID: Donna Rhodes, female    DOB: [birth year], 71 y.o.   MRN: [id number] 70 y.o.LH femalewith history of HTN, GERD, RA, lung nodule who was admitted to [facility] on 04/24/17 with multiple falls and MRI brain without acute findings and cervical spondylosis with central protrusion at C4/5. She was sent home and started developing left sided weakness withleft facial weakness and slurred speech. 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 MRA brain/neck was negative for occlusion or stenosis. 2D echo with EF 50-55% and grade 1 diastolic dysfunction. Stroke felt to be due to small vessel disease and Dr. Doy Mince recommended low dose ASA and plavix for secondary stroke prevention. She did have increase in weakness LUE on 4/30 and was treated with fluid bolus. She was started on dysphagia 3, thin liquids with aspiration precautions and educated on OME. Patient with resultant left sided weakness, right lean and decreased in ability to WB on LLE. She has had significant decline in mobility and ability to carry out ADL tasks. CIR recommended for follow up therapy.   HPI No falls  Need help to adjust clothing and donning air cast  Left hand swelling Some Left foot swelling  Left shoulder not painful  Pain Inventory Average Pain 4 Pain Right Now 4 My pain is intermittent and sore  In the last 24 hours, has pain interfered with the following? General activity 0 Relation with others 0 Enjoyment of life 0 What TIME of day is your pain at its worst? varies with movement Sleep (in general) Fair  Pain is worse with: some activites and raising arm Pain improves with: rest Relief from Meds: no pain meds  Mobility walk with assistance use a cane ability to climb steps?  yes do you drive?  no use a wheelchair needs help with transfers Do you have any goals in this area?   yes  Function retired I need assistance with the following:  bathing, meal prep, household duties and shopping  Neuro/Psych No problems in this area  Prior Studies hospital f/u  Physicians involved in your care hospital f/u   Family History  Problem Relation Age of Onset  . Hypertension Father   . Osteoarthritis Mother   . Arthritis/Rheumatoid Sister   . Breast cancer Unknown        paternal side  . Breast cancer Paternal Aunt   . Breast cancer Cousin        maternal  . Uterine cancer Maternal Grandmother   . Ovarian cancer Neg Hx   . Heart disease Neg Hx   . Diabetes Neg Hx   . Colon cancer Neg Hx    Social History   Social History  . Marital status: Divorced    Spouse name: N/A  . Number of children: 3  . Years of education: N/A   Social History Main Topics  . Smoking status: Former Smoker    Quit date: 12/29/1979  . Smokeless tobacco: Never Used  . Alcohol use No  . Drug use: No  . Sexual activity: Not Currently    Birth control/ protection: Surgical   Other Topics Concern  . None   Social History Narrative  . None   Past Surgical History:  Procedure Laterality Date  . ADENOIDECTOMY    . APPENDECTOMY  1965  . [place]   left  . CHOLECYSTECTOMY    . FINGER SURGERY  1982  . laser vein surgery    . MANDIBLE RECONSTRUCTION  1992  . TOENAIL EXCISION  1998   several  . [place]  . VAGINAL HYSTERECTOMY  1983   als0- anterior/posterior colporrhaphy  . VAGINAL WOUND CLOSURE / REPAIR  1999, 2000  . VAGINAL WOUND CLOSURE / REPAIR     Past Medical History:  Diagnosis Date  . Atrophic vaginitis   . Dysplasia of vagina    vin 1 - vulva and vaginal cuff  . GERD (gastroesophageal reflux disease)   . HCV infection   . HSV (herpes simplex virus) infection   . Hypercholesterolemia   . Hypertension   . Hypothyroidism   . Lung nodule    stable  . Menopausal state   . Osteoarthritis    hand involvement, cervical disc disease  .  Osteoporosis   . Pelvic pain in female   . Rheumatoid arthritis ([place])   . Stroke (cerebrum) ([place]) 04/24/2017  . Thyroid nodule   . Vaginal Pap smear, abnormal    lgsil   BP 120/74 (BP Location: Right Arm, Patient Position: Sitting, Cuff Size: Normal)   Pulse 63   Resp 14   LMP 12/29/1981   SpO2 95%   Opioid Risk Score:   Fall Risk Score:  `1  Depression screen PHQ 2/9  Depression screen [facility] 2/9 05/26/2017 10/23/2016 09/30/2015 05/31/2015 09/11/2014 05/29/2013 12/31/2012  Decreased Interest 0 0 0 0 0 0 0  Down, Depressed, Hopeless 0 0 0 0 0 0 0  PHQ - 2 Score 0 0 0 0 0 0 0    Review of Systems  Constitutional: Negative.   HENT: Negative.   Eyes: Negative.   Respiratory: Negative.   Cardiovascular: Negative.   Gastrointestinal: Negative.   Endocrine: Negative.   Genitourinary: Negative.   Musculoskeletal: Positive for arthralgias.  Skin: Negative.   Allergic/Immunologic: Negative.   Neurological: Negative.   Hematological: Negative.   Psychiatric/Behavioral: Negative.   All other systems reviewed and are negative.      Objective:   Physical Exam  Constitutional: She is oriented to person, place, and time. She appears well-developed and well-nourished.  HENT:  Head: Normocephalic and atraumatic.  Eyes: Conjunctivae and EOM are normal. Pupils are equal, round, and reactive to light.  Neck: Normal range of motion.  Cardiovascular: Normal rate and regular rhythm.   No murmur heard. Pulmonary/Chest: Effort normal and breath sounds normal. No respiratory distress. She has no wheezes.  Abdominal: Soft. Bowel sounds are normal. She exhibits no distension. There is no tenderness.  Neurological: She is alert and oriented to person, place, and time.  Motor strength is 5/5 in the right deltoid, biceps, triceps, grip, hip flexor, knee extensor, ankle dorsiflexion. 2 minus in the left finger flexors. Trace biceps, 0 triceps, 0 deltoid Left lower extremity. 3. At the hip flexor, 4  minus. Knee extensor, 3 minus. Ankle dorsiflexor  Sensation intact to pinprick and light touch in the upper and lower limbs bilaterally  Skin: Skin is warm and dry.  Nursing note and vitals reviewed.  Left hand has dorsum of the hand edema 1 plus. Skin is warm and dry. Left ankle, trace edema  Patient has some inversion of the left foot during ambulation. This is partially controlled by Aircast.     Assessment & Plan:  1. 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  She has swelling in the left hand and left ankle due to weakness  and lack of muscle activity. This was discussed with patient and her daughter.  Recommended elevation as well as retrograde massage  Recommend continue home health PT, OT for the next month, then follow-up with me for transition to outpatient therapy.  2. Hyperlipidemia. Continue Lipitor  3. Anticoagulation. Continue aspirin and Plavix as directed by neurology. Will need neurology follow-up theym .May discontinue aspirin at 3 months post stroke

## 2017-06-02 DIAGNOSIS — I10 Essential (primary) hypertension: Secondary | ICD-10-CM | POA: Diagnosis not present

## 2017-06-02 DIAGNOSIS — M069 Rheumatoid arthritis, unspecified: Secondary | ICD-10-CM | POA: Diagnosis not present

## 2017-06-02 DIAGNOSIS — Z87891 Personal history of nicotine dependence: Secondary | ICD-10-CM | POA: Diagnosis not present

## 2017-06-02 DIAGNOSIS — M0609 Rheumatoid arthritis without rheumatoid factor, multiple sites: Secondary | ICD-10-CM | POA: Diagnosis not present

## 2017-06-02 DIAGNOSIS — K219 Gastro-esophageal reflux disease without esophagitis: Secondary | ICD-10-CM | POA: Diagnosis not present

## 2017-06-02 DIAGNOSIS — Z7902 Long term (current) use of antithrombotics/antiplatelets: Secondary | ICD-10-CM | POA: Diagnosis not present

## 2017-06-02 DIAGNOSIS — I69354 Hemiplegia and hemiparesis following cerebral infarction affecting left non-dominant side: Secondary | ICD-10-CM | POA: Diagnosis not present

## 2017-06-02 DIAGNOSIS — Z79899 Other long term (current) drug therapy: Secondary | ICD-10-CM | POA: Diagnosis not present

## 2017-06-02 DIAGNOSIS — Z7982 Long term (current) use of aspirin: Secondary | ICD-10-CM | POA: Diagnosis not present

## 2017-06-09 ENCOUNTER — Ambulatory Visit: Payer: PPO | Admitting: Internal Medicine

## 2017-06-11 ENCOUNTER — Other Ambulatory Visit: Payer: Self-pay | Admitting: Physical Medicine and Rehabilitation

## 2017-06-11 DIAGNOSIS — K219 Gastro-esophageal reflux disease without esophagitis: Secondary | ICD-10-CM | POA: Diagnosis not present

## 2017-06-11 DIAGNOSIS — I69354 Hemiplegia and hemiparesis following cerebral infarction affecting left non-dominant side: Secondary | ICD-10-CM | POA: Diagnosis not present

## 2017-06-11 DIAGNOSIS — M069 Rheumatoid arthritis, unspecified: Secondary | ICD-10-CM | POA: Diagnosis not present

## 2017-06-11 DIAGNOSIS — Z7902 Long term (current) use of antithrombotics/antiplatelets: Secondary | ICD-10-CM | POA: Diagnosis not present

## 2017-06-11 DIAGNOSIS — I10 Essential (primary) hypertension: Secondary | ICD-10-CM | POA: Diagnosis not present

## 2017-06-11 DIAGNOSIS — Z87891 Personal history of nicotine dependence: Secondary | ICD-10-CM | POA: Diagnosis not present

## 2017-06-11 DIAGNOSIS — Z7982 Long term (current) use of aspirin: Secondary | ICD-10-CM | POA: Diagnosis not present

## 2017-06-14 DIAGNOSIS — I69354 Hemiplegia and hemiparesis following cerebral infarction affecting left non-dominant side: Secondary | ICD-10-CM | POA: Diagnosis not present

## 2017-06-14 DIAGNOSIS — M069 Rheumatoid arthritis, unspecified: Secondary | ICD-10-CM | POA: Diagnosis not present

## 2017-06-14 DIAGNOSIS — K219 Gastro-esophageal reflux disease without esophagitis: Secondary | ICD-10-CM | POA: Diagnosis not present

## 2017-06-14 DIAGNOSIS — Z7902 Long term (current) use of antithrombotics/antiplatelets: Secondary | ICD-10-CM | POA: Diagnosis not present

## 2017-06-14 DIAGNOSIS — I10 Essential (primary) hypertension: Secondary | ICD-10-CM | POA: Diagnosis not present

## 2017-06-14 DIAGNOSIS — Z87891 Personal history of nicotine dependence: Secondary | ICD-10-CM | POA: Diagnosis not present

## 2017-06-14 DIAGNOSIS — Z7982 Long term (current) use of aspirin: Secondary | ICD-10-CM | POA: Diagnosis not present

## 2017-06-14 NOTE — Telephone Encounter (Signed)
Medications were prescribed at hospital discharge following patient's Right-sided lacunar infarction.  These medications are not typically prescribed by Physical Medicine and Rehabilitation on a maintenance basis. Please advise on future refills

## 2017-06-15 ENCOUNTER — Telehealth: Payer: Self-pay | Admitting: *Deleted

## 2017-06-15 DIAGNOSIS — I10 Essential (primary) hypertension: Secondary | ICD-10-CM | POA: Diagnosis not present

## 2017-06-15 DIAGNOSIS — I69354 Hemiplegia and hemiparesis following cerebral infarction affecting left non-dominant side: Secondary | ICD-10-CM | POA: Diagnosis not present

## 2017-06-15 DIAGNOSIS — Z7982 Long term (current) use of aspirin: Secondary | ICD-10-CM | POA: Diagnosis not present

## 2017-06-15 DIAGNOSIS — M79642 Pain in left hand: Secondary | ICD-10-CM

## 2017-06-15 DIAGNOSIS — M069 Rheumatoid arthritis, unspecified: Secondary | ICD-10-CM | POA: Diagnosis not present

## 2017-06-15 DIAGNOSIS — Z7902 Long term (current) use of antithrombotics/antiplatelets: Secondary | ICD-10-CM | POA: Diagnosis not present

## 2017-06-15 DIAGNOSIS — Z87891 Personal history of nicotine dependence: Secondary | ICD-10-CM | POA: Diagnosis not present

## 2017-06-15 DIAGNOSIS — K219 Gastro-esophageal reflux disease without esophagitis: Secondary | ICD-10-CM | POA: Diagnosis not present

## 2017-06-15 NOTE — Telephone Encounter (Signed)
Patients daughter notified of below and verbalized understanding.

## 2017-06-15 NOTE — Telephone Encounter (Signed)
Pt slid out of her bed on 06/18. Patient injured her left hand, pt has total motion of the hand , with a little swelling . Patients daughter requested to have a call, physical therapy took a look at the hand on 06/14/17, they thought the hand looked okay, however pt wanted to have a Xray to make sure there are no futher injuries. Daughter requested a call with advice. Contact Amy [phone]

## 2017-06-15 NOTE — Telephone Encounter (Signed)
X rays of wrist and hand ordered.  Dr Nicki Reaper is out of of the office for the week.  If there are any fractures I will make a referral to orthopedics

## 2017-06-15 NOTE — Telephone Encounter (Signed)
Sent in prescriptions for Donna Rhodes.  Let me know if you need anything else.

## 2017-06-17 ENCOUNTER — Ambulatory Visit (INDEPENDENT_AMBULATORY_CARE_PROVIDER_SITE_OTHER): Payer: PPO

## 2017-06-17 ENCOUNTER — Ambulatory Visit: Payer: PPO

## 2017-06-17 ENCOUNTER — Other Ambulatory Visit: Payer: Self-pay

## 2017-06-17 ENCOUNTER — Telehealth: Payer: Self-pay | Admitting: Radiology

## 2017-06-17 DIAGNOSIS — M79642 Pain in left hand: Secondary | ICD-10-CM

## 2017-06-17 DIAGNOSIS — S60222A Contusion of left hand, initial encounter: Secondary | ICD-10-CM | POA: Diagnosis not present

## 2017-06-17 DIAGNOSIS — M25512 Pain in left shoulder: Secondary | ICD-10-CM | POA: Diagnosis not present

## 2017-06-17 DIAGNOSIS — M13842 Other specified arthritis, left hand: Secondary | ICD-10-CM | POA: Diagnosis not present

## 2017-06-17 DIAGNOSIS — M25532 Pain in left wrist: Secondary | ICD-10-CM | POA: Diagnosis not present

## 2017-06-17 DIAGNOSIS — M79622 Pain in left upper arm: Secondary | ICD-10-CM | POA: Diagnosis not present

## 2017-06-17 DIAGNOSIS — Z8673 Personal history of transient ischemic attack (TIA), and cerebral infarction without residual deficits: Secondary | ICD-10-CM | POA: Diagnosis not present

## 2017-06-17 NOTE — Telephone Encounter (Signed)
Noted, thanks!

## 2017-06-17 NOTE — Telephone Encounter (Signed)
FYI. Pt came in for left hand and left wrist xray's ordered by Dr. Derrel Nip without being seen by a provider. PT was c/o also of left proximal humerus and left shoulder pain as well. Pt stated she wanted her left shoulder and left arm to be imaged as well. Explained to pt that I would be unable to image her shoulder and humerus without an order but would talk to Dr. Derrel Nip to see if she will order. Dr. Derrel Nip was in a room with her pt, so proceeded to image pt left hand and left wrist that had been ordered. Informed Lavella Lemons of situation and the condition of pt. Lavella Lemons came down to lab to notify me that an order had been placed of left shoulder, left humerus and left forearm per Dr. Derrel Nip. Pt refused forearm xray and stated she had no pain there. Proceeded with left shoulder and left humerus xray. Per Dr. Derrel Nip, pt to wait for results but pt refused to stay and stated she wanted to be called with results.

## 2017-06-17 NOTE — Telephone Encounter (Signed)
Patient arrived for Xrays, Tech came to me and patient is requesting to have shoulder and arm xrays completed also.  Staff concerned that patient needs to be seen, currently doing xray now, would you like further xrays done.

## 2017-06-17 NOTE — Telephone Encounter (Signed)
The patient left before the x rays were resulted .  The additional shoulder and humerus films are pending, but the wrist and hand films show no acute changes

## 2017-06-18 ENCOUNTER — Telehealth: Payer: Self-pay | Admitting: *Deleted

## 2017-06-18 NOTE — Telephone Encounter (Signed)
Daughter Manuela Schwartz notified by VM per DPR

## 2017-06-18 NOTE — Telephone Encounter (Signed)
May use diclofenac or Voltaren gel. I would not recommend ibuprofen

## 2017-06-18 NOTE — Telephone Encounter (Signed)
Donna Rhodes's daughter called to ask if it is ok for Donna Rhodes to take ibuprofen and use voltaren gel.  They were told to ask since she had a stroke. Please advise

## 2017-06-19 DIAGNOSIS — I639 Cerebral infarction, unspecified: Secondary | ICD-10-CM | POA: Diagnosis not present

## 2017-06-20 NOTE — Telephone Encounter (Signed)
Per record, was notified and referred to [facility].

## 2017-06-21 DIAGNOSIS — I10 Essential (primary) hypertension: Secondary | ICD-10-CM | POA: Diagnosis not present

## 2017-06-21 DIAGNOSIS — Z87891 Personal history of nicotine dependence: Secondary | ICD-10-CM | POA: Diagnosis not present

## 2017-06-21 DIAGNOSIS — Z7902 Long term (current) use of antithrombotics/antiplatelets: Secondary | ICD-10-CM | POA: Diagnosis not present

## 2017-06-21 DIAGNOSIS — I69354 Hemiplegia and hemiparesis following cerebral infarction affecting left non-dominant side: Secondary | ICD-10-CM | POA: Diagnosis not present

## 2017-06-21 DIAGNOSIS — M069 Rheumatoid arthritis, unspecified: Secondary | ICD-10-CM | POA: Diagnosis not present

## 2017-06-21 DIAGNOSIS — Z7982 Long term (current) use of aspirin: Secondary | ICD-10-CM | POA: Diagnosis not present

## 2017-06-21 DIAGNOSIS — K219 Gastro-esophageal reflux disease without esophagitis: Secondary | ICD-10-CM | POA: Diagnosis not present

## 2017-06-22 ENCOUNTER — Telehealth: Payer: Self-pay

## 2017-06-22 DIAGNOSIS — I69354 Hemiplegia and hemiparesis following cerebral infarction affecting left non-dominant side: Secondary | ICD-10-CM | POA: Diagnosis not present

## 2017-06-22 DIAGNOSIS — Z7902 Long term (current) use of antithrombotics/antiplatelets: Secondary | ICD-10-CM | POA: Diagnosis not present

## 2017-06-22 DIAGNOSIS — Z87891 Personal history of nicotine dependence: Secondary | ICD-10-CM | POA: Diagnosis not present

## 2017-06-22 DIAGNOSIS — I10 Essential (primary) hypertension: Secondary | ICD-10-CM | POA: Diagnosis not present

## 2017-06-22 DIAGNOSIS — Z7982 Long term (current) use of aspirin: Secondary | ICD-10-CM | POA: Diagnosis not present

## 2017-06-22 DIAGNOSIS — M069 Rheumatoid arthritis, unspecified: Secondary | ICD-10-CM | POA: Diagnosis not present

## 2017-06-22 DIAGNOSIS — K219 Gastro-esophageal reflux disease without esophagitis: Secondary | ICD-10-CM | POA: Diagnosis not present

## 2017-06-22 NOTE — Telephone Encounter (Signed)
Sherlynn Stalls OT [facility] called [phone] requesting verbal orders to extend OT due to patient being In Emergency room, verbal orders approved.

## 2017-06-23 ENCOUNTER — Telehealth: Payer: Self-pay | Admitting: Internal Medicine

## 2017-06-23 NOTE — Telephone Encounter (Signed)
Pt daughter called and stated that they want to go over medication and possibly start getting off citalopram (CELEXA) 10 MG tablet. Please advise, thank you!  Call Amy @ [phone]

## 2017-06-23 NOTE — Telephone Encounter (Signed)
Confirm that she is on citalopram 10mg  q day.  If so, then to taper, she can decrease to 1/2 (10mg ) tablet q day for 10 days and then 1/2 (10mg ) qod for 10 days and then stop.  Regarding the urinary symptoms, need to know specifically how she is doing.  Ideally, would prefer her to be seen, but if doing ok (i.e., eating, drinking, no nausea or vomiting, abdominal pain, etc) then can bring urine here for urinalysis and culture.

## 2017-06-23 NOTE — Telephone Encounter (Signed)
Called daughter she states that patient is taking the Celexa at night but feels like it is making her sleepy all day. She does not feel like she needs it and would like to stop it. Daughter wanted to know if you are ok with that and if so how to titrate her off of it.   Also wanted [place] to know that she is taking OTC Pepcid as well as Protonix. They are going to stop the OTC and see if she is still getting the same amount of improvement.   Mom told her today that she has had some incontinence issues for the last 3 days with dysuria. Daughter brought sterile cup form work and took back and tested and it showed large leukocytes. Wanted to know if you felt like she needed to be treated for UTI?   I informed daughter that you are our of the office for the rest of the afternoon. If any changes in symptoms or she felt this needed to be addressed right away she should take to ED or walking. She is fine waiting until tomorrow.

## 2017-06-24 NOTE — Telephone Encounter (Signed)
Spoke to daughter she is on citalopram 10mg  she will taper as directed had her repeat all information back to me. She states mom has not had any other symptoms with the urine other than the incontinence and dysuria. Daughter will check and make sure this am that nothing has changed and call me back and let me know if they need to be seen or if she will drop off urine.

## 2017-06-25 DIAGNOSIS — M069 Rheumatoid arthritis, unspecified: Secondary | ICD-10-CM | POA: Diagnosis not present

## 2017-06-25 DIAGNOSIS — K219 Gastro-esophageal reflux disease without esophagitis: Secondary | ICD-10-CM | POA: Diagnosis not present

## 2017-06-25 DIAGNOSIS — Z7902 Long term (current) use of antithrombotics/antiplatelets: Secondary | ICD-10-CM | POA: Diagnosis not present

## 2017-06-25 DIAGNOSIS — Z87891 Personal history of nicotine dependence: Secondary | ICD-10-CM | POA: Diagnosis not present

## 2017-06-25 DIAGNOSIS — I69354 Hemiplegia and hemiparesis following cerebral infarction affecting left non-dominant side: Secondary | ICD-10-CM | POA: Diagnosis not present

## 2017-06-25 DIAGNOSIS — Z7982 Long term (current) use of aspirin: Secondary | ICD-10-CM | POA: Diagnosis not present

## 2017-06-25 DIAGNOSIS — I10 Essential (primary) hypertension: Secondary | ICD-10-CM | POA: Diagnosis not present

## 2017-06-25 NOTE — Telephone Encounter (Signed)
Called to check on patient no answer no v/m

## 2017-06-27 DIAGNOSIS — M069 Rheumatoid arthritis, unspecified: Secondary | ICD-10-CM | POA: Diagnosis not present

## 2017-06-27 DIAGNOSIS — I69354 Hemiplegia and hemiparesis following cerebral infarction affecting left non-dominant side: Secondary | ICD-10-CM | POA: Diagnosis not present

## 2017-06-27 DIAGNOSIS — K219 Gastro-esophageal reflux disease without esophagitis: Secondary | ICD-10-CM | POA: Diagnosis not present

## 2017-06-27 DIAGNOSIS — Z7902 Long term (current) use of antithrombotics/antiplatelets: Secondary | ICD-10-CM | POA: Diagnosis not present

## 2017-06-27 DIAGNOSIS — Z7982 Long term (current) use of aspirin: Secondary | ICD-10-CM | POA: Diagnosis not present

## 2017-06-27 DIAGNOSIS — I10 Essential (primary) hypertension: Secondary | ICD-10-CM | POA: Diagnosis not present

## 2017-06-27 DIAGNOSIS — Z87891 Personal history of nicotine dependence: Secondary | ICD-10-CM | POA: Diagnosis not present

## 2017-06-29 ENCOUNTER — Telehealth: Payer: Self-pay

## 2017-06-29 NOTE — Telephone Encounter (Signed)
Called amy they have app on Thursday with GYN to follow up on issue will call if any questions.

## 2017-06-29 NOTE — Telephone Encounter (Signed)
Amy works at [employer].  She is Melody Shambley's CMA.  Should be able to reach her there.

## 2017-06-29 NOTE — Telephone Encounter (Signed)
Called daughter no v/m set up. Do you need me to keep trying to reach?

## 2017-06-29 NOTE — Telephone Encounter (Signed)
Returned Customer service manager (OT with [facility])  phone call and mentioned to Sherlynn Stalls that Dr. Letta Pate was on vacation. Once Dr. Letta Pate returns he will sign all order. Ester verbalize understanding and was very appreciative of the call back.

## 2017-06-29 NOTE — Telephone Encounter (Signed)
Donna Rhodes has called stating she needs an order faxed to [facility]. Fax to: [phone]  for patient to attend Therapy. Donna Rhodes can be reached at [phone] if there are any questions or concerns.

## 2017-07-01 ENCOUNTER — Ambulatory Visit (INDEPENDENT_AMBULATORY_CARE_PROVIDER_SITE_OTHER): Payer: PPO | Admitting: Obstetrics and Gynecology

## 2017-07-01 ENCOUNTER — Encounter: Payer: Self-pay | Admitting: Obstetrics and Gynecology

## 2017-07-01 ENCOUNTER — Encounter: Payer: PPO | Admitting: Obstetrics and Gynecology

## 2017-07-01 VITALS — BP 144/76 | HR 66 | Ht 63.0 in | Wt 116.1 lb

## 2017-07-01 DIAGNOSIS — R32 Unspecified urinary incontinence: Secondary | ICD-10-CM | POA: Diagnosis not present

## 2017-07-01 DIAGNOSIS — R300 Dysuria: Secondary | ICD-10-CM | POA: Diagnosis not present

## 2017-07-01 DIAGNOSIS — N952 Postmenopausal atrophic vaginitis: Secondary | ICD-10-CM | POA: Diagnosis not present

## 2017-07-01 LAB — POCT URINALYSIS DIPSTICK
Bilirubin, UA: NEGATIVE
Glucose, UA: NEGATIVE
Ketones, UA: NEGATIVE
LEUKOCYTES UA: NEGATIVE
NITRITE UA: NEGATIVE
PH UA: 6 (ref 5.0–8.0)
Protein, UA: NEGATIVE
RBC UA: NEGATIVE
Spec Grav, UA: >=1.03 — AB (ref 1.010–1.025)
UROBILINOGEN UA: 0.2 U/dL

## 2017-07-01 NOTE — Patient Instructions (Addendum)

## 2017-07-02 ENCOUNTER — Telehealth: Payer: Self-pay | Admitting: Internal Medicine

## 2017-07-02 DIAGNOSIS — K219 Gastro-esophageal reflux disease without esophagitis: Secondary | ICD-10-CM | POA: Diagnosis not present

## 2017-07-02 DIAGNOSIS — I10 Essential (primary) hypertension: Secondary | ICD-10-CM | POA: Diagnosis not present

## 2017-07-02 DIAGNOSIS — M79622 Pain in left upper arm: Secondary | ICD-10-CM

## 2017-07-02 DIAGNOSIS — Z7982 Long term (current) use of aspirin: Secondary | ICD-10-CM | POA: Diagnosis not present

## 2017-07-02 DIAGNOSIS — Z87891 Personal history of nicotine dependence: Secondary | ICD-10-CM | POA: Diagnosis not present

## 2017-07-02 DIAGNOSIS — Z7902 Long term (current) use of antithrombotics/antiplatelets: Secondary | ICD-10-CM | POA: Diagnosis not present

## 2017-07-02 DIAGNOSIS — M069 Rheumatoid arthritis, unspecified: Secondary | ICD-10-CM | POA: Diagnosis not present

## 2017-07-02 DIAGNOSIS — I69354 Hemiplegia and hemiparesis following cerebral infarction affecting left non-dominant side: Secondary | ICD-10-CM | POA: Diagnosis not present

## 2017-07-02 NOTE — Telephone Encounter (Signed)
If she hurt her arm and there is a knot, I would still like to refer her to ortho for further evaluation.

## 2017-07-02 NOTE — Progress Notes (Signed)
    GYNECOLOGY PROGRESS NOTE  Subjective:    Patient ID: Donna Rhodes, female    DOB: 07/11/1946, 71 y.o.   MRN: [id number]  HPI  Patient is a 71 y.o. [id number] female with recent h/o TIA who presents for complaints of urinary incontinence x 1 week. Notes spontaneous episodes of leakage of urine, and cannot hold urine to get to the restroom.  Also notes mild dysuria over the past 3 days, and has several episodes of spotting with wiping.  Has been taking Azo for the past several days which has helped symptoms. Notes that the urinary incontinence has almost resolved starting yesterday.   The following portions of the patient's history were reviewed and updated as appropriate: allergies, current medications, past family history, past medical history, past social history, past surgical history and problem list.  Review of Systems Pertinent items noted in HPI and remainder of comprehensive ROS otherwise negative.   Objective:   Blood pressure (!) 144/76, pulse 66, height 5\' 3"  (1.6 m), weight 116 lb 1.6 oz (52.7 kg), last menstrual period 12/29/1981. General appearance: alert and no distress Abdomen: soft, non-tender; bowel sounds normal; no masses,  no organomegaly  Back: symmetric, no curvature. ROM normal. No CVA tenderness. Pelvic: external genitalia normal, rectovaginal septum normal.  Vagina without discharge. Moderate atrophy present. Cervix normal appearing, no lesions and no motion tenderness.  Uterus mobile, nontender, normal shape and size.  Adnexae non-palpable, nontender bilaterally.    Labs:  Results for orders placed or performed in visit on 07/01/17  POCT urinalysis dipstick  Result Value Ref Range   Color, UA dark yellow    Clarity, UA clear    Glucose, UA neg    Bilirubin, UA neg    Ketones, UA neg    Spec Grav, UA >=1.030 (A) 1.010 - 1.025   Blood, UA neg    pH, UA 6.0 5.0 - 8.0   Protein, UA neg    Urobilinogen, UA 0.2 0.2 or 1.0 E.U./dL   Nitrite, UA neg    Leukocytes, UA Negative Negative    Assessment:   Urinary incontinence (resolving) Dysuria Vaginal atrophy  Plan:   - Discussed urinary symptoms with patient.  UA with no evidence of UTI however symptoms suggestive. Will send urine culture. Urinary incontinence resolving. Continue Azo.   - Moderate vaginal atrophy.  Patient notes that she has not used her Premarin cream in a while due to "everything that has been going on". Encouraged use.  - To f/u if symptoms do not resolve or worsen.    Rubie Maid, MD [facility]

## 2017-07-02 NOTE — Telephone Encounter (Signed)
Patient is ok with being evaled by ortho.

## 2017-07-02 NOTE — Telephone Encounter (Signed)
Patient stated she hurt her arm between her elbow and shoulder. There is a knot on the muscle and she would like to have it evauated and an MRI is what she feels like will see something if there is something. Please advise.

## 2017-07-02 NOTE — Telephone Encounter (Signed)
Please advise 

## 2017-07-02 NOTE — Telephone Encounter (Signed)
Pt called and stated her left arm is not any better. She states that she would like an MRI done to see on her elbow and shoulder. Please advise, thank you!  Call pt @ [phone]

## 2017-07-02 NOTE — Telephone Encounter (Signed)
Need specifics of what is going on.  If persistent pain in her shoulder and arm, I would like to have ortho evaluate to determine further w/up.

## 2017-07-03 LAB — URINE CULTURE

## 2017-07-04 NOTE — Telephone Encounter (Signed)
Order placed for ortho referral.   

## 2017-07-05 ENCOUNTER — Telehealth: Payer: Self-pay | Admitting: Obstetrics and Gynecology

## 2017-07-05 ENCOUNTER — Telehealth: Payer: Self-pay | Admitting: Internal Medicine

## 2017-07-05 ENCOUNTER — Telehealth: Payer: Self-pay

## 2017-07-05 NOTE — Telephone Encounter (Signed)
Spoke with pt regarding AWV. Pt stated that she is not interested in scheduling awv at this time, but she may decide to schedule appt later this year (2018). Informed pt to call office whenever she is ready to schedule appt.

## 2017-07-05 NOTE — Telephone Encounter (Signed)
Called pt's daughter listed on DPR, informed her of negative urine cx.

## 2017-07-05 NOTE — Telephone Encounter (Signed)
Tried to call pt and discuss.  LMOM to call back.  Ortho referral has been placed.  It appears that her questions regarding this referral have been handled.  I reviewed the holter results from cardiology and the report that I can see is that her holter monitor did not reveal any significant arrhythmias.  I would like for her to discuss this with cardiology as well.  We can contact them regarding a f/u appt.  Should not need another referral.  Let me know if I need to call cardiology.

## 2017-07-05 NOTE — Telephone Encounter (Signed)
Pt requested a call, in ortho referral  Pt contact [phone]

## 2017-07-05 NOTE — Telephone Encounter (Signed)
Patient called back states that she thought that after her last o/v we were going to send referral over to Dr. Ubaldo Glassing for a follow up. She states no one has called about that appointment. She is also upset that his office has not called back about the heart monitor that she had. She also had questions about the ortho app that I was able to handel for her.

## 2017-07-05 NOTE — Telephone Encounter (Signed)
Left message to return call to our office.  

## 2017-07-05 NOTE — Telephone Encounter (Signed)
Called pt informed her of negative urine cx.

## 2017-07-05 NOTE — Telephone Encounter (Signed)
Patient lvm wanting to know the results of the patient last Urine culture. Patient would like to speak with someone in regards to that, and patient did not disclose any other information. Please advise.

## 2017-07-05 NOTE — Telephone Encounter (Signed)
-----   Message from Rubie Maid, MD sent at 07/04/2017  4:05 PM EDT ----- Please inform patient of negative urine culture.

## 2017-07-06 NOTE — Telephone Encounter (Signed)
Called patient she had several questions about medications and new symptoms. I have made app for Friday to be seen in office.

## 2017-07-07 DIAGNOSIS — M25519 Pain in unspecified shoulder: Secondary | ICD-10-CM | POA: Diagnosis not present

## 2017-07-09 ENCOUNTER — Encounter: Payer: Self-pay | Admitting: Internal Medicine

## 2017-07-09 ENCOUNTER — Ambulatory Visit (INDEPENDENT_AMBULATORY_CARE_PROVIDER_SITE_OTHER): Payer: PPO | Admitting: Internal Medicine

## 2017-07-09 ENCOUNTER — Telehealth: Payer: Self-pay | Admitting: *Deleted

## 2017-07-09 VITALS — BP 124/68 | HR 68 | Temp 98.7°F | Resp 12 | Ht 63.0 in | Wt 112.0 lb

## 2017-07-09 DIAGNOSIS — E039 Hypothyroidism, unspecified: Secondary | ICD-10-CM

## 2017-07-09 DIAGNOSIS — E7800 Pure hypercholesterolemia, unspecified: Secondary | ICD-10-CM

## 2017-07-09 DIAGNOSIS — G8194 Hemiplegia, unspecified affecting left nondominant side: Secondary | ICD-10-CM

## 2017-07-09 DIAGNOSIS — I6381 Other cerebral infarction due to occlusion or stenosis of small artery: Secondary | ICD-10-CM

## 2017-07-09 DIAGNOSIS — F32A Depression, unspecified: Secondary | ICD-10-CM

## 2017-07-09 DIAGNOSIS — R350 Frequency of micturition: Secondary | ICD-10-CM | POA: Diagnosis not present

## 2017-07-09 DIAGNOSIS — F4323 Adjustment disorder with mixed anxiety and depressed mood: Secondary | ICD-10-CM | POA: Diagnosis not present

## 2017-07-09 DIAGNOSIS — G8320 Monoplegia of upper limb affecting unspecified side: Secondary | ICD-10-CM

## 2017-07-09 DIAGNOSIS — R002 Palpitations: Secondary | ICD-10-CM | POA: Diagnosis not present

## 2017-07-09 DIAGNOSIS — D649 Anemia, unspecified: Secondary | ICD-10-CM | POA: Diagnosis not present

## 2017-07-09 DIAGNOSIS — M069 Rheumatoid arthritis, unspecified: Secondary | ICD-10-CM | POA: Diagnosis not present

## 2017-07-09 DIAGNOSIS — F329 Major depressive disorder, single episode, unspecified: Secondary | ICD-10-CM | POA: Diagnosis not present

## 2017-07-09 DIAGNOSIS — R42 Dizziness and giddiness: Secondary | ICD-10-CM | POA: Diagnosis not present

## 2017-07-09 DIAGNOSIS — G8114 Spastic hemiplegia affecting left nondominant side: Secondary | ICD-10-CM

## 2017-07-09 DIAGNOSIS — K219 Gastro-esophageal reflux disease without esophagitis: Secondary | ICD-10-CM

## 2017-07-09 DIAGNOSIS — I639 Cerebral infarction, unspecified: Secondary | ICD-10-CM

## 2017-07-09 DIAGNOSIS — I10 Essential (primary) hypertension: Secondary | ICD-10-CM

## 2017-07-09 LAB — CBC WITH DIFFERENTIAL/PLATELET
BASOS ABS: 0.1 10*3/uL (ref 0.0–0.1)
Basophils Relative: 1.6 % (ref 0.0–3.0)
EOS PCT: 5.4 % — AB (ref 0.0–5.0)
Eosinophils Absolute: 0.3 10*3/uL (ref 0.0–0.7)
HCT: 36.1 % (ref 36.0–46.0)
HEMOGLOBIN: 11.9 g/dL — AB (ref 12.0–15.0)
Lymphocytes Relative: 19.5 % (ref 12.0–46.0)
Lymphs Abs: 1 10*3/uL (ref 0.7–4.0)
MCHC: 32.8 g/dL (ref 30.0–36.0)
MCV: 95.1 fl (ref 78.0–100.0)
MONO ABS: 0.5 10*3/uL (ref 0.1–1.0)
Monocytes Relative: 9.6 % (ref 3.0–12.0)
NEUTROS PCT: 63.9 % (ref 43.0–77.0)
Neutro Abs: 3.4 10*3/uL (ref 1.4–7.7)
Platelets: 265 10*3/uL (ref 150.0–400.0)
RBC: 3.79 Mil/uL — AB (ref 3.87–5.11)
RDW: 15.9 % — ABNORMAL HIGH (ref 11.5–15.5)
WBC: 5.3 10*3/uL (ref 4.0–10.5)

## 2017-07-09 LAB — BASIC METABOLIC PANEL
BUN: 20 mg/dL (ref 6–23)
CALCIUM: 9.6 mg/dL (ref 8.4–10.5)
CO2: 29 mEq/L (ref 19–32)
CREATININE: 0.81 mg/dL (ref 0.40–1.20)
Chloride: 105 mEq/L (ref 96–112)
GFR: 73.94 mL/min (ref >60.00)
Glucose, Bld: 84 mg/dL (ref 70–99)
POTASSIUM: 3.9 meq/L (ref 3.5–5.1)
Sodium: 142 mEq/L (ref 135–145)

## 2017-07-09 LAB — URINALYSIS, ROUTINE W REFLEX MICROSCOPIC
Bilirubin Urine: NEGATIVE
Hgb urine dipstick: NEGATIVE
Nitrite: NEGATIVE
PH: 5.5 (ref 5.0–8.0)
RBC / HPF: NONE SEEN (ref 0–?)
SPECIFIC GRAVITY, URINE: 1.025 (ref 1.000–1.030)
Total Protein, Urine: NEGATIVE
URINE GLUCOSE: NEGATIVE
UROBILINOGEN UA: 0.2 (ref 0.0–1.0)

## 2017-07-09 LAB — TSH: TSH: 2.38 u[IU]/mL (ref 0.35–4.50)

## 2017-07-09 LAB — HEPATIC FUNCTION PANEL
ALBUMIN: 4.2 g/dL (ref 3.5–5.2)
ALK PHOS: 68 U/L (ref 39–117)
ALT: 23 U/L (ref 0–35)
AST: 23 U/L (ref 0–37)
Bilirubin, Direct: 0.1 mg/dL (ref 0.0–0.3)
TOTAL PROTEIN: 6.9 g/dL (ref 6.0–8.3)
Total Bilirubin: 0.5 mg/dL (ref 0.2–1.2)

## 2017-07-09 NOTE — Progress Notes (Signed)
Patient ID: Donna Rhodes, female   DOB: 02/15/1946, 71 y.o.   MRN: [id number]   Subjective:    Patient ID: Donna Rhodes, female    DOB: Apr 10, 1946, 71 y.o.   MRN: [id number]  HPI  Patient here for as a work in with concerns regarding polyuria and had questions about her medications.  She is accompanied by her sister.  History obtained from both of them.  She reports noticing some nocturia and increased urinary frequency recently.  Also noticed some light pink on the tissue when she wiped.  Started AZO and started drinking cranberry juice.  Has also started noticing a smell to her urine.  No abdominal pain.  Some intermittent constipation.  She is eating, but decreased po intake.  Discussed nutritional shakes to supplement her meals.  Working with therapy.  Is improving.  Still with limitations - moving her left arm and using her left hand.  Some left upper arm pain.  Has appt with Dr Jefm Bryant - 07/15/17.  States feels fatigued.  Some chest fatigue.  No actual pain.  Discussed further w/up.  Discussed EKG.  She declines.  Due to see Dr Ubaldo Glassing today after this appt.  had questions regarding her medications.  Was concerned that her cholesterol medication or the plavix could be causing her urinary symptoms.  No vaginal itching or discharge.      Past Medical History:  Diagnosis Date  . Atrophic vaginitis   . Dysplasia of vagina    vin 1 - vulva and vaginal cuff  . GERD (gastroesophageal reflux disease)   . HCV infection   . HSV (herpes simplex virus) infection   . Hypercholesterolemia   . Hypertension   . Hypothyroidism   . Lung nodule    stable  . Menopausal state   . Osteoarthritis    hand involvement, cervical disc disease  . Osteoporosis   . Pelvic pain in female   . Rheumatoid arthritis ([place])   . 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 Thyroid nodule   . Vaginal Pap smear, abnormal    lgsil   Past Surgical History:  Procedure Laterality Date  . ADENOIDECTOMY    . APPENDECTOMY   1965  . [place]   left  . CHOLECYSTECTOMY    . [place]  . laser vein surgery    . MANDIBLE RECONSTRUCTION  1992  . TOENAIL EXCISION  1998   several  . [place]  . VAGINAL HYSTERECTOMY  1983   als0- anterior/posterior colporrhaphy  . VAGINAL WOUND CLOSURE / REPAIR  1999, 2000  . VAGINAL WOUND CLOSURE / REPAIR     Family History  Problem Relation Age of Onset  . Hypertension Father   . Osteoarthritis Mother   . Arthritis/Rheumatoid Sister   . Breast cancer Unknown        paternal side  . Breast cancer Paternal Aunt   . Breast cancer Cousin        maternal  . Uterine cancer Maternal Grandmother   . Ovarian cancer Neg Hx   . Heart disease Neg Hx   . Diabetes Neg Hx   . Colon cancer Neg Hx    Social History   Social History  . Marital status: Divorced    Spouse name: N/A  . Number of children: 3  . Years of education: N/A   Social History Main Topics  . Smoking status: Former Smoker    Quit date: 12/29/1979  . Smokeless  tobacco: Never Used  . Alcohol use No  . Drug use: No  . Sexual activity: Not Currently    Birth control/ protection: Surgical   Other Topics Concern  . None   Social History Narrative  . None    Outpatient Encounter Prescriptions as of 07/09/2017  Medication Sig  . amLODipine (NORVASC) 5 MG tablet TAKE 1 TABLET BY MOUTH ONCE DAILY  . aspirin 81 MG EC tablet Take 1 tablet (81 mg total) by mouth daily.  Marland Kitchen atorvastatin (LIPITOR) 40 MG tablet TAKE 1 TABLET BY MOUTH ONCE DAILY AT  6PM  . calcium-vitamin D (OSCAL WITH D) 250-125 MG-UNIT tablet Take 1 tablet by mouth daily.  . citalopram (CELEXA) 10 MG tablet TAKE 1 TABLET BY MOUTH ONCE DAILY (Patient taking differently: half tablet daily trying to wean off)  . clopidogrel (PLAVIX) 75 MG tablet TAKE 1 TABLET BY MOUTH ONCE DAILY  . folic acid (FOLVITE) 1 MG tablet Take 1 mg by mouth daily.  Marland Kitchen levothyroxine (SYNTHROID, LEVOTHROID) 50 MCG tablet Take 1 tablet (50 mcg  total) by mouth daily before breakfast.  . methotrexate 50 MG/2ML injection Inject 0.26mL under the skin once a week.  . pantoprazole (PROTONIX) 40 MG tablet TAKE 1 TABLET BY MOUTH ONCE DAILY   No facility-administered encounter medications on file as of 07/09/2017.     Review of Systems  Constitutional: Positive for fatigue.       Decreased po intake.  Decreased weight.   HENT: Negative for congestion and sinus pressure.   Respiratory: Negative for cough, chest tightness and shortness of breath.   Cardiovascular: Negative for chest pain, palpitations and leg swelling.       "chest fatigue" as outlined.   Gastrointestinal: Negative for abdominal pain, diarrhea, nausea and vomiting.  Genitourinary: Positive for frequency. Negative for dysuria and vaginal discharge.  Musculoskeletal: Negative for back pain and myalgias.  Skin: Negative for color change and rash.  Neurological: Negative for dizziness, light-headedness and headaches.  Psychiatric/Behavioral: Negative for agitation.       Increased stress.  She has been trying to taper the citalopram.  Discussed going back on previous dose.         Objective:    Physical Exam  Constitutional: She appears well-developed and well-nourished. No distress.  HENT:  Nose: Nose normal.  Mouth/Throat: Oropharynx is clear and moist.  Neck: Neck supple.  Cardiovascular: Normal rate and regular rhythm.   Pulmonary/Chest: Breath sounds normal. No respiratory distress. She has no wheezes.  Abdominal: Soft. Bowel sounds are normal. There is no tenderness.  Musculoskeletal: She exhibits no edema or tenderness.  Lymphadenopathy:    She has no cervical adenopathy.  Skin: No rash noted. No erythema.  Psychiatric: She has a normal mood and affect. Her behavior is normal.    BP 124/68 (BP Location: Right Arm, Patient Position: Sitting, Cuff Size: Normal)   Pulse 68   Temp 98.7 F (37.1 C) (Oral)   Resp 12   Ht 5\' 3"  (1.6 m)   Wt 112 lb (50.8 kg)    LMP 12/29/1981   SpO2 97%   BMI 19.84 kg/m  Wt Readings from Last 3 Encounters:  07/09/17 112 lb (50.8 kg)  07/01/17 116 lb 1.6 oz (52.7 kg)  05/26/17 115 lb 12 oz (52.5 kg)     Lab Results  Component Value Date   WBC 5.3 07/09/2017   HGB 11.9 (L) 07/09/2017   HCT 36.1 07/09/2017   PLT 265.0 07/09/2017   GLUCOSE  84 07/09/2017   CHOL 203 (H) 04/26/2017   TRIG 88 04/26/2017   HDL 54 04/26/2017   LDLDIRECT 128.0 05/25/2013   LDLCALC 131 (H) 04/26/2017   ALT 23 07/09/2017   AST 23 07/09/2017   NA 142 07/09/2017   K 3.9 07/09/2017   CL 105 07/09/2017   CREATININE 0.81 07/09/2017   BUN 20 07/09/2017   CO2 29 07/09/2017   TSH 2.38 07/09/2017   HGBA1C 5.7 (H) 04/25/2017    Dg Chest 2 View  Result Date: 04/27/2017 CLINICAL DATA:  Cough since last night. EXAM: CHEST  2 VIEW COMPARISON:  Chest CT 09/23/2010 FINDINGS: Lungs are adequately inflated without consolidation or effusion. Cardiomediastinal silhouette is within normal bones and soft tissues are unremarkable. IMPRESSION: No active cardiopulmonary disease. Electronically Signed   By: Marin Olp M.D.   On: 04/27/2017 18:09       Assessment & Plan:   Problem List Items Addressed This Visit    Adjustment disorder with mixed anxiety and depressed mood    On citalopram.  Increase back up to one tablet per day.  Follow.        CVA (cerebral vascular accident) [facility])    Recently admitted with CVA.  Therapy.  Continue exercises.  Remain on aspirin and plavix.        Depression    Was trying to taper citalopram.  Discussed with her today.  She was questioning going back on previous dose.  Will increase to one tablet per day.  Did well with this previously.  Follow.       Essential hypertension    Blood pressure under good control.  Continue same medication regimen.  Follow pressures.  Follow metabolic panel.        Relevant Orders   Basic metabolic panel (Completed)   Flaccid monoplegia of upper extremity (HCC)     Continue therapy and exercise.  Follow.       GERD (gastroesophageal reflux disease)    Controlled on protonix.       Hypercholesterolemia    On lipitor.  Follow lipid panel and liver function tests.  Discussed that I did not feel the medications were contributing to her current symptoms (i.e., urinary frequency, etc).  Continue.        Relevant Orders   Hepatic function panel (Completed)   Hypothyroidism - Primary    On thyroid replacement.  Follow tsh.       Relevant Orders   TSH (Completed)   Left spastic hemiparesis (HCC)    Recent CVA.  On aspirin and plavix.  Continue exercise.        Rheumatoid arthritis involving both hands ([place])    Followed by Dr Jefm Bryant.  Has f/u planned 07/15/17.        Right-sided lacunar infarction [facility])    Recent cva.  Left arm weakness.  Continue aspirin and plavix.  Continue exercise.        Urinary frequency    Will recheck urin to confirm no infection.  Is s/p hysterectomy.  No vaginal discharge.  Noticed pink on tissue.  Not sure if emptying bladder fully.  Pending results of urine, may need urology evaluation.  Discussed with pt today.        Other Visit Diagnoses    Anemia, unspecified type       Relevant Orders   CBC with Differential/Platelet (Completed)   Increased urinary frequency       Relevant Orders   Urinalysis, Routine w reflex  microscopic (Completed)   Urine Culture (Completed)       Einar Pheasant, MD

## 2017-07-09 NOTE — Progress Notes (Signed)
Pre-visit discussion using our clinic review tool. No additional management support is needed unless otherwise documented below in the visit note.  

## 2017-07-09 NOTE — Telephone Encounter (Signed)
Esther OT [facility] called to ask about the requested referrals to [facility] rehab that was requested 06/29/17.  I do no see that orders were place.  I have placed the orders for OT and PT and let Sherlynn Stalls know that it has been done.

## 2017-07-11 ENCOUNTER — Encounter: Payer: Self-pay | Admitting: Internal Medicine

## 2017-07-11 DIAGNOSIS — R350 Frequency of micturition: Secondary | ICD-10-CM | POA: Insufficient documentation

## 2017-07-11 LAB — URINE CULTURE

## 2017-07-11 NOTE — Assessment & Plan Note (Signed)
Controlled on protonix.   

## 2017-07-11 NOTE — Assessment & Plan Note (Signed)
Recent cva.  Left arm weakness.  Continue aspirin and plavix.  Continue exercise.

## 2017-07-11 NOTE — Assessment & Plan Note (Signed)
Blood pressure under good control.  Continue same medication regimen.  Follow pressures.  Follow metabolic panel.   

## 2017-07-11 NOTE — Assessment & Plan Note (Signed)
Recent CVA.  On aspirin and plavix.  Continue exercise.

## 2017-07-11 NOTE — Assessment & Plan Note (Signed)
On thyroid replacement.  Follow tsh.  

## 2017-07-11 NOTE — Assessment & Plan Note (Signed)
Recently admitted with CVA.  Therapy.  Continue exercises.  Remain on aspirin and plavix.

## 2017-07-11 NOTE — Assessment & Plan Note (Signed)
On citalopram.  Increase back up to one tablet per day.  Follow.

## 2017-07-11 NOTE — Assessment & Plan Note (Signed)
Followed by Dr Jefm Bryant.  Has f/u planned 07/15/17.

## 2017-07-11 NOTE — Assessment & Plan Note (Signed)
Will recheck urin to confirm no infection.  Is s/p hysterectomy.  No vaginal discharge.  Noticed pink on tissue.  Not sure if emptying bladder fully.  Pending results of urine, may need urology evaluation.  Discussed with pt today.

## 2017-07-11 NOTE — Assessment & Plan Note (Signed)
Was trying to taper citalopram.  Discussed with her today.  She was questioning going back on previous dose.  Will increase to one tablet per day.  Did well with this previously.  Follow.

## 2017-07-11 NOTE — Assessment & Plan Note (Signed)
On lipitor.  Follow lipid panel and liver function tests.  Discussed that I did not feel the medications were contributing to her current symptoms (i.e., urinary frequency, etc).  Continue.

## 2017-07-11 NOTE — Assessment & Plan Note (Signed)
Continue therapy and exercise.  Follow.

## 2017-07-12 ENCOUNTER — Ambulatory Visit (HOSPITAL_BASED_OUTPATIENT_CLINIC_OR_DEPARTMENT_OTHER): Payer: PPO | Admitting: Physical Medicine & Rehabilitation

## 2017-07-12 ENCOUNTER — Encounter: Payer: PPO | Attending: Physical Medicine & Rehabilitation

## 2017-07-12 ENCOUNTER — Telehealth: Payer: Self-pay

## 2017-07-12 ENCOUNTER — Encounter: Payer: Self-pay | Admitting: Physical Medicine & Rehabilitation

## 2017-07-12 ENCOUNTER — Other Ambulatory Visit: Payer: Self-pay | Admitting: Internal Medicine

## 2017-07-12 VITALS — BP 143/76 | HR 74

## 2017-07-12 DIAGNOSIS — Z7982 Long term (current) use of aspirin: Secondary | ICD-10-CM | POA: Diagnosis not present

## 2017-07-12 DIAGNOSIS — R531 Weakness: Secondary | ICD-10-CM | POA: Diagnosis not present

## 2017-07-12 DIAGNOSIS — M7502 Adhesive capsulitis of left shoulder: Secondary | ICD-10-CM | POA: Diagnosis not present

## 2017-07-12 DIAGNOSIS — M069 Rheumatoid arthritis, unspecified: Secondary | ICD-10-CM | POA: Insufficient documentation

## 2017-07-12 DIAGNOSIS — G8114 Spastic hemiplegia affecting left nondominant side: Secondary | ICD-10-CM | POA: Diagnosis not present

## 2017-07-12 DIAGNOSIS — K219 Gastro-esophageal reflux disease without esophagitis: Secondary | ICD-10-CM | POA: Insufficient documentation

## 2017-07-12 DIAGNOSIS — Z7902 Long term (current) use of antithrombotics/antiplatelets: Secondary | ICD-10-CM | POA: Diagnosis not present

## 2017-07-12 DIAGNOSIS — M7989 Other specified soft tissue disorders: Secondary | ICD-10-CM | POA: Insufficient documentation

## 2017-07-12 DIAGNOSIS — E785 Hyperlipidemia, unspecified: Secondary | ICD-10-CM | POA: Insufficient documentation

## 2017-07-12 DIAGNOSIS — R350 Frequency of micturition: Secondary | ICD-10-CM

## 2017-07-12 DIAGNOSIS — I69354 Hemiplegia and hemiparesis following cerebral infarction affecting left non-dominant side: Secondary | ICD-10-CM | POA: Diagnosis not present

## 2017-07-12 DIAGNOSIS — I10 Essential (primary) hypertension: Secondary | ICD-10-CM | POA: Insufficient documentation

## 2017-07-12 DIAGNOSIS — G8194 Hemiplegia, unspecified affecting left nondominant side: Secondary | ICD-10-CM

## 2017-07-12 DIAGNOSIS — R269 Unspecified abnormalities of gait and mobility: Secondary | ICD-10-CM | POA: Insufficient documentation

## 2017-07-12 NOTE — Telephone Encounter (Signed)
Not ready for driving yet, we will discuss at next visit

## 2017-07-12 NOTE — Telephone Encounter (Signed)
Donna Rhodes forgot to ask while on her visit with you when it will be ok for her to drive and if she is able where can she be tested to see if she is fit to drive again.

## 2017-07-12 NOTE — Progress Notes (Signed)
Subjective:    Patient ID: Donna Rhodes, female    DOB: 02/21/1946, 71 y.o.   MRN: [id number]  HPI Left shoulder, upper arm pain. Had x-rays after a fall, demonstrating no fracture in the humeral area. No shoulder joint abnormalities other than subluxation noted of the glenohumeral joint. Hand x-ray showed osteoarthritis diffusely in the PIP and DIP joints.  Patient wanted to stop some medication stopped Celexa, but started crying more. She recently restarted this. Has completed. Home health PT, OT, interested in going to outpatient at [facility] Pain Inventory Average Pain 10 Pain Right Now 0 My pain is intermittent, sharp and stabbing  In the last 24 hours, has pain interfered with the following? General activity 5 Relation with others 5 Enjoyment of life 5 What TIME of day is your pain at its worst? all Sleep (in general) Fair  Pain is worse with: some activites and certian movement Pain improves with: positional Relief from Meds: na  Mobility walk with assistance use a cane ability to climb steps?  no do you drive?  no  Function retired  Neuro/Psych bladder control problems bowel control problems weakness numbness tingling trouble walking  Prior Studies Any changes since last visit?  no  Physicians involved in your care Any changes since last visit?  no   Family History  Problem Relation Age of Onset  . Hypertension Father   . Osteoarthritis Mother   . Arthritis/Rheumatoid Sister   . Breast cancer Unknown        paternal side  . Breast cancer Paternal Aunt   . Breast cancer Cousin        maternal  . Uterine cancer Maternal Grandmother   . Ovarian cancer Neg Hx   . Heart disease Neg Hx   . Diabetes Neg Hx   . Colon cancer Neg Hx    Social History   Social History  . Marital status: Divorced    Spouse name: N/A  . Number of children: 3  . Years of education: N/A   Social History Main Topics  . Smoking status:  Former Smoker    Quit date: 12/29/1979  . Smokeless tobacco: Never Used  . Alcohol use No  . Drug use: No  . Sexual activity: Not Currently    Birth control/ protection: Surgical   Other Topics Concern  . None   Social History Narrative  . None   Past Surgical History:  Procedure Laterality Date  . ADENOIDECTOMY    . APPENDECTOMY  1965  . [place]   left  . CHOLECYSTECTOMY    . [place]  . laser vein surgery    . MANDIBLE RECONSTRUCTION  1992  . TOENAIL EXCISION  1998   several  . [place]  . VAGINAL HYSTERECTOMY  1983   als0- anterior/posterior colporrhaphy  . VAGINAL WOUND CLOSURE / REPAIR  1999, 2000  . VAGINAL WOUND CLOSURE / REPAIR     Past Medical History:  Diagnosis Date  . Atrophic vaginitis   . Dysplasia of vagina    vin 1 - vulva and vaginal cuff  . GERD (gastroesophageal reflux disease)   . HCV infection   . HSV (herpes simplex virus) infection   . Hypercholesterolemia   . Hypertension   . Hypothyroidism   . Lung nodule    stable  . Menopausal state   . Osteoarthritis    hand involvement, cervical disc disease  . Osteoporosis   .  Pelvic pain in female   . Rheumatoid arthritis ([place])   . 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 Thyroid nodule   . Vaginal Pap smear, abnormal    lgsil   BP (!) 143/76   Pulse 74   LMP 12/29/1981   SpO2 97%   Opioid Risk Score:   Fall Risk Score:  `1  Depression screen PHQ 2/9  Depression screen [facility] 2/9 05/26/2017 10/23/2016 09/30/2015 05/31/2015 09/11/2014 05/29/2013 12/31/2012  Decreased Interest 0 0 0 0 0 0 0  Down, Depressed, Hopeless 0 0 0 0 0 0 0  PHQ - 2 Score 0 0 0 0 0 0 0     Review of Systems  Constitutional: Positive for appetite change and diaphoresis.  HENT: Negative.   Eyes: Negative.   Respiratory: Positive for shortness of breath.   Cardiovascular: Negative.   Gastrointestinal: Positive for constipation.  Endocrine: Negative.   Genitourinary: Negative.     Musculoskeletal: Positive for joint swelling.  Skin: Negative.   Allergic/Immunologic: Negative.   Neurological: Negative.   Hematological: Bruises/bleeds easily.  Psychiatric/Behavioral: Negative.        Objective:   Physical Exam  Constitutional: She is oriented to person, place, and time. She appears well-developed and well-nourished.  HENT:  Head: Normocephalic and atraumatic.  Eyes: Pupils are equal, round, and reactive to light. Conjunctivae and EOM are normal.  Musculoskeletal:  Left shoulder has pain with external rotation as well as abduction.  Neurological: She is alert and oriented to person, place, and time.  Motor strength is 2 minus at the elbow flexor and finger flexors on the left side, trace extension at the elbow. Left lower extremity 4 minus. Hip flexion, knee extension, ankle dorsiflexion. Ambulates with valgus deformity at left knee tends to roll laterally on the foot. She has a stiff legged gait.  Psychiatric: She has a normal mood and affect.  Nursing note and vitals reviewed.         Assessment & Plan:   1. Left shoulder pain radiating to the left upper arm. Multifactorial. Left shoulder subluxation, left adhesive capsulitis, may have some element of impingement as well.  2. Gait disorder secondary to CVA. Has valgus deformity at the knee secondary to proximal weakness. Also has some feeling of buckling, but no falls. No evidence of foot drag  We'll make referral to outpatient PT, OT Left shoulder glenohumeral injection today Will schedule for series of 3, if first injection, not helpful, we will do under ultrasound guidance.  Left Shoulder injection   Indication:Left Shoulder pain not relieved by medication management and other conservative care.  Informed consent was obtained after describing risks and benefits of the procedure with the patient, this includes bleeding, bruising, infection and medication side effects. The patient wishes to proceed  and has given written consent. Patient was placed in a seated position. The left shoulder was marked and prepped with betadine in the subacromial area. A 25-gauge 1-1/2 inch needle was inserted into the subacromial area. After negative draw back for blood, a solution containing 1 mL of 6 mg per ML betamethasone and 4 mL of 1% lidocaine was injected. A band aid was applied. The patient tolerated the procedure well. Post procedure instructions were given.

## 2017-07-12 NOTE — Progress Notes (Signed)
Order placed for urology referral.  

## 2017-07-13 NOTE — Telephone Encounter (Signed)
[place] notified.

## 2017-07-15 DIAGNOSIS — M150 Primary generalized (osteo)arthritis: Secondary | ICD-10-CM | POA: Diagnosis not present

## 2017-07-15 DIAGNOSIS — Z79899 Other long term (current) drug therapy: Secondary | ICD-10-CM | POA: Diagnosis not present

## 2017-07-15 DIAGNOSIS — M0609 Rheumatoid arthritis without rheumatoid factor, multiple sites: Secondary | ICD-10-CM | POA: Diagnosis not present

## 2017-07-15 DIAGNOSIS — I639 Cerebral infarction, unspecified: Secondary | ICD-10-CM | POA: Diagnosis not present

## 2017-07-19 ENCOUNTER — Ambulatory Visit: Payer: PPO | Admitting: Diagnostic Neuroimaging

## 2017-07-20 ENCOUNTER — Telehealth: Payer: Self-pay | Admitting: Internal Medicine

## 2017-07-20 NOTE — Telephone Encounter (Signed)
Went over last lab results and mailed copy to patient. She will call if any further questions.

## 2017-07-20 NOTE — Telephone Encounter (Signed)
Pt called and requested to go over lab results again. Pt would like to know what Dr. Nicki Reaper was checking for. Please advise, thank you!  Call pt @ [phone]

## 2017-07-21 ENCOUNTER — Encounter: Payer: Self-pay | Admitting: Physical Therapy

## 2017-07-21 ENCOUNTER — Ambulatory Visit: Payer: PPO | Attending: Physical Medicine & Rehabilitation | Admitting: Occupational Therapy

## 2017-07-21 ENCOUNTER — Ambulatory Visit: Payer: PPO | Admitting: Physical Therapy

## 2017-07-21 ENCOUNTER — Encounter: Payer: Self-pay | Admitting: Occupational Therapy

## 2017-07-21 DIAGNOSIS — R2689 Other abnormalities of gait and mobility: Secondary | ICD-10-CM | POA: Diagnosis not present

## 2017-07-21 DIAGNOSIS — M6281 Muscle weakness (generalized): Secondary | ICD-10-CM | POA: Insufficient documentation

## 2017-07-21 DIAGNOSIS — I69352 Hemiplegia and hemiparesis following cerebral infarction affecting left dominant side: Secondary | ICD-10-CM | POA: Insufficient documentation

## 2017-07-21 NOTE — Patient Instructions (Signed)
Balance, Proprioception: Hip Abduction With Tubing   With tubing attached to both ankles, Standing holding onto counter, kick one leg out to side and then Return.  Repeat _10___ times  On each side.  Do ___2_ sessions per day.  http://cc.exer.us/20   Copyright  VHI. All rights reserved.  Balance, Proprioception: Hip Adduction With Tubing   With tubing tied around table leg, loop band around one ankle and try to cross leg in front. Return. Repeat _10___ times. do __2__ sessions per day.  http://cc.exer.us/21   Copyright  VHI. All rights reserved.  Balance, Proprioception: Hip Extension With Tubing   With tubing tied around both legs, holding onto kitchen counter, swing leg back. Return. Repeat _10___ times . Do __2__ sessions per day.   Ankle dorsiflexion   Sit with right foot flat. Have band tied around both feet, bend ankle, bringing toes toward head. Complete __2_ sets of __10_ repetitions. Perform _2__ sessions per day. ANKLE: Plantarflexion, Bilateral - Standing    Stand with upright posture. Raise heels up as high as possible. ___ reps per set, ___ sets per day, ___ days per week Hold onto a support.  Copyright  VHI. All rights reserved.

## 2017-07-21 NOTE — Therapy (Addendum)
[place] MAIN [facility] SERVICES [address] [place], [place], [address] Phone: [phone]   Fax:  [phone]  Occupational Therapy Evaluation  Patient Details  Name: Donna Rhodes MRN: [id number] Date of Birth: [birth year] Referring Provider: Dr. Letta Pate  Encounter Date: 07/21/2017      OT End of Session - 07/21/17 1230    Visit Number 1   Number of Visits 24   Date for OT Re-Evaluation 10/13/17   Authorization Type Medicare G Code 1 of 10   OT Start Time 1100   OT Stop Time 1200   OT Time Calculation (min) 60 min   Activity Tolerance Patient tolerated treatment well   Behavior During Therapy [facility] for tasks assessed/performed      Past Medical History:  Diagnosis Date  . Atrophic vaginitis   . Dysplasia of vagina    vin 1 - vulva and vaginal cuff  . GERD (gastroesophageal reflux disease)   . HCV infection   . HSV (herpes simplex virus) infection   . Hypercholesterolemia   . Hypertension   . Hypothyroidism   . Lung nodule    stable  . Menopausal state   . Osteoarthritis    hand involvement, cervical disc disease  . Osteoporosis   . Pelvic pain in female   . Rheumatoid arthritis ([place])   . 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 Thyroid nodule   . Vaginal Pap smear, abnormal    lgsil    Past Surgical History:  Procedure Laterality Date  . ADENOIDECTOMY    . APPENDECTOMY  1965  . [place]   left  . CHOLECYSTECTOMY    . [place]  . laser vein surgery    . MANDIBLE RECONSTRUCTION  1992  . TOENAIL EXCISION  1998   several  . [place]  . VAGINAL HYSTERECTOMY  1983   als0- anterior/posterior colporrhaphy  . VAGINAL WOUND CLOSURE / REPAIR  1999, 2000  . VAGINAL WOUND CLOSURE / REPAIR      There were no vitals filed for this visit.      Subjective Assessment - 07/21/17 1107    Subjective  Pt. reports she had home health OT with Sherlynn Stalls.   Patient is accompained by: Family  member   Pertinent History Pt. is a 71 y.o. female who had a CVA on 04/24/2017. Pt. went to the ER when she started not feeling well, and having heart palpitations. Pt. reports having work up initially, however was sent home. Pt. reports later in the day she was unable to write with her left hand. Pt. returned to the ER, and was admitted with workup for a CVA. Pt. was admitted to [facility], and stayed for approximately 3 weeks. Pt. returned home alone, and  received home health services for the past 2 months. Pt. now presents for Outpatient OT services. Pt. resides home alone, and has support children who stop by daily, assist with shopping, transportation, and  ADL/IADL tasks as needed.    Limitations Pt. is unable to use her dominant RUE during functional tasks.   Patient Stated Goals To get back the way she was. (Independent)   Currently in Pain? No/denies   Pain Score 10-Worst pain ever   Pain Location Shoulder   Pain Orientation Left   Pain Descriptors / Indicators Aching   Aggravating Factors  At shoulder flexion, and abduction end ranges.  [facility] OT Assessment - 07/21/17 1118      Assessment   Diagnosis CVA   Referring Provider Dr. Letta Pate   Onset Date 04/16/17     Balance Screen   Has the patient fallen in the past 6 months Yes   How many times? 1   Has the patient had a decrease in activity level because of a fear of falling?  Yes   Is the patient reluctant to leave their home because of a fear of falling?  No     Home  Environment   Family/patient expects to be discharged to: Private residence   Available Help at Discharge Family   Type of [place] One level   Bathroom Shower/Tub Tub/Shower unit;Curtain   [place]   How accessible Accessible via walker   [place] held shower head;Tub bench;Grab bars - tub/shower;Bedside commode;Wheelchair - [employer] -quad   Lives With Alone      Prior Function   Level of Independence Independent   Vocation Retired   Leisure yard work, Statistician     ADL   Eating/Feeding Independent  uses right hand,    Grooming --  increaased time, uses nondominant right hand.   Upper Body Bathing Minimal assistance   Lower Body Bathing Increased time   Upper Body Dressing Independent;Increased time   Lower Body Dressing Increased time   Toilet Transfer Independent   Tub/Shower Transfer Independent   ADL comments Pt. is using her nondominant right hand to complete ADL tasks.      IADL   Shopping Completely unable to shop   Light Housekeeping Does personal laundry completely;Needs help with all home maintenance tasks   Meal Prep Able to complete simple warm meal prep   Community Mobility Relies on family or friends for transportation   Medication Management Is responsible for taking medication in correct dosages at correct time   Financial Management Manages day-to-day purchases, but needs help with banking, major purchases, etc.     Written Expression   Dominant Hand Left     Vision - History   Baseline Vision Wears glasses all the time  Has a cataract     Activity Tolerance   Activity Tolerance Tolerates 10-20 min activity with muiltiple rests  fatigues lately, secondary to not sleeping well at night.     Cognition   Overall Cognitive Status Within Functional Limits for tasks assessed     Coordination   Gross Motor Movements are Fluid and Coordinated No   Fine Motor Movements are Fluid and Coordinated No   Finger Nose Finger Test Impaired     AROM   Overall AROM Comments RUE WNL, LUE: Active elbow flexion through full Range, initiating active supination, digit flexion to [facility] 2nd: 4cm, 3rd: 5.5, 4th: 6cm, 5th: 4 cm Pt. is initiating active thumb movement.     PROM   Overall PROM Comments LUE shoulder flexion to 91 degrees,with 10/10 pain at end range., shoulder abduction 67 degrees, wrist  extension 37 degrees.      Strength   Overall Strength Comments RUE: WFL.                         OT Education - 07/21/17 1229    Education provided Yes   Education Details LUE functioning, splint adjustment, and splint application   Person(s) Educated Patient   Methods Explanation;Demonstration   Comprehension Returned demonstration;Verbal cues required;Verbalized  understanding             OT Long Term Goals - 07/21/17 1411      OT LONG TERM GOAL #1   Title Pt. will tolerate increased shoulder PROM by 20 degrees with pain improved to under 3/10   Baseline Flexion: 91, Abduction: 67   Time 12   Period Weeks   Status New   Target Date 10/13/17     OT LONG TERM GOAL #2   Title Pt. will formulate a full fist to be able to stabilize objects with her Left hand while using her right hand.    Baseline Digit flexion to [facility]: 2nd digit: 4cm, 3rd: 5.5, 4th: 6, 5th: 4.   Time 12   Period Weeks   Status New   Target Date 10/13/17     OT LONG TERM GOAL #3   Title Pt. will be able to wipe face independently with her Left UE.   Baseline Pt. is unable   Time 12   Period Weeks   Status New     OT LONG TERM GOAL #4   Title Pt. will demonstrate independence with splint application.   Baseline Pt. is unable to donn splint    Time 12   Period Weeks   Status New   Target Date 10/13/17     OT LONG TERM GOAL #5   Title Pt. will initiate active grasp release with thumb to 2nd digit 100% of the time to be able to hold a utensil in preparation for self-feeding.   Baseline Minimal thum movement.   Time 12   Period Weeks   Status New   Target Date 10/13/17     Long Term Additional Goals   Additional Long Term Goals Yes     OT LONG TERM GOAL #6   Title Pt. will initiate wrist extension in preparation for functional reaching.   Baseline No active wrist extension.   Time 12   Period Weeks   Status New   Target Date 10/13/17               Plan - 07/21/17 1231    Clinical  Impression Statement Pt. is a 71 y.o. female who had a CVA with residual left sided weakness. Pt. is unable to use, or engage her dominant LUE during ADL, and IADL tasks. Pt. does present with active elbow flexion, limited digit flexion, forearm supination, and thumb moveement. Pt. has a resting hand splint that she brought to the session. Pt. reports she has not worn it since the hospital. Splint was adjusted, and pt. was provided with education about proper application, and  fit. Pt. will benefit from skilled OT services to work on improving RUE functioning for improved engagement in daily ADL, and self-care tasks.   Occupational Profile and client history currently impacting functional performance Pt. resides home alone, is retired, and has supprotive family.   Occupational performance deficits (Please refer to evaluation for details): ADL's;IADL's   Rehab Potential Good   OT Frequency 2x / week   OT Duration 12 weeks   Clinical Decision Making Multiple treatment options, significant modification of task necessary   Consulted and Agree with Plan of Care Patient      Patient will benefit from skilled therapeutic intervention in order to improve the following deficits and impairments:  Decreased cognition, Difficulty walking, Decreased balance, Decreased strength, Decreased safety awareness, Decreased coordination, Impaired UE functional use, Impaired tone, Decreased range of motion, Decreased endurance, Decreased  activity tolerance, Impaired perceived functional ability, Pain  Visit Diagnosis: Muscle weakness (generalized)      G-Codes - 08/01/17 1239    Functional Assessment Tool Used (Outpatient only) clinical judgement based on pt. current functional level, MMT, ROM   Functional Limitation Carrying, moving and handling objects;Self care   Carrying, Moving and Handling Objects Current Status [phone]) At least 70 percent but less than 100 percent impaired, limited or restricted   Carrying,  Moving and Handling Objects Goal Status (T7711) At least 40 percent but less than 60 percent impaired, limited or restricted   Self Care Current Status (A5790) At least 20 percent but less than 40 percent impaired, limited or restricted   Self Care Goal Status (X8333) At least 1 percent but less than 20 percent impaired, limited or restricted      Problem List Patient Active Problem List   Diagnosis Date Noted  . Urinary frequency 07/11/2017  . Gait disturbance, post-stroke 05/31/2017  . Left spastic hemiparesis ([place]) 05/31/2017  . Adjustment disorder with mixed anxiety and depressed mood   . Hypokalemia   . HCAP (healthcare-associated pneumonia)   . Cough with fever   . 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 FUO (fever of unknown origin)   . Oral thrush   . Benign essential HTN   . Rheumatoid arthritis involving both hands ([place]) 04/28/2017  . 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[place]) 04/27/2017  . CVA (cerebral vascular accident) ([place]) 04/24/2017  . LGSIL Pap smear of vagina 12/15/2016  . Anal skin tag 02/02/2016  . Inflammatory arthritis 01/19/2016  . Right knee pain 01/19/2016  . Genital herpes 10/20/2015  . GERD (gastroesophageal reflux disease) 10/20/2015  . Status post vaginal hysterectomy 10/20/2015  . Health care maintenance 05/30/2015  . LLQ pain 03/31/2014  . Menopausal symptoms 03/31/2014  . Hypercholesterolemia 12/31/2012  . Hypothyroidism 12/29/2012  . Osteoarthritis 12/29/2012  . PULMONARY NODULE 01/02/2008  . Depression 12/30/2007  . Essential hypertension 12/30/2007    Harrel Carina, MS, OTR/L August 01, 2017, 3:30 PM  [place] MAIN [facility] SERVICES [address] [place], [place], [address] Phone: [phone]   Fax:  [phone]  Name: Donna Rhodes MRN: [id number] Date of Birth: 05-10-1946

## 2017-07-21 NOTE — Therapy (Signed)
[place] MAIN [facility] SERVICES [address] [place], [place], [address] Phone: [phone]   Fax:  [phone]  Physical Therapy Evaluation  Patient Details  Name: Donna Rhodes MRN: [id number] Date of Birth: Feb 11, 1946 Referring Provider: Alysia Penna  Encounter Date: 07/21/2017      PT End of Session - 07/21/17 0944    Visit Number 1   Number of Visits 17   Date for PT Re-Evaluation 09/15/17   Authorization Type gcode 1   Authorization Time Period 10   PT Start Time 1000   PT Stop Time 1100   PT Time Calculation (min) 60 min   Equipment Utilized During Treatment Gait belt   Activity Tolerance Patient tolerated treatment well;No increased pain   Behavior During Therapy [facility] for tasks assessed/performed;Flat affect      Past Medical History:  Diagnosis Date  . Atrophic vaginitis   . Dysplasia of vagina    vin 1 - vulva and vaginal cuff  . GERD (gastroesophageal reflux disease)   . HCV infection   . HSV (herpes simplex virus) infection   . Hypercholesterolemia   . Hypertension   . Hypothyroidism   . Lung nodule    stable  . Menopausal state   . Osteoarthritis    hand involvement, cervical disc disease  . Osteoporosis   . Pelvic pain in female   . Rheumatoid arthritis ([place])   . 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 Thyroid nodule   . Vaginal Pap smear, abnormal    lgsil    Past Surgical History:  Procedure Laterality Date  . ADENOIDECTOMY    . APPENDECTOMY  1965  . [place]   left  . CHOLECYSTECTOMY    . [place]  . laser vein surgery    . MANDIBLE RECONSTRUCTION  1992  . TOENAIL EXCISION  1998   several  . [place]  . VAGINAL HYSTERECTOMY  1983   als0- anterior/posterior colporrhaphy  . VAGINAL WOUND CLOSURE / REPAIR  1999, 2000  . VAGINAL WOUND CLOSURE / REPAIR      There were no vitals filed for this visit.      Subjective Assessment - 07/21/17 0956     Subjective Pt reports she finished home health 2 weeks ago; pt states walking has improved more than UE use; Pt reports that she can not use the left arm at all and when moving it at times will be painful; Pt has the [facility] but states she does not use the device anywhere;   Pertinent History Pt is a 71 y.o female that presents to therapy s/p CVA; On April 24, 2017 pt suffered an acute infarct in right basal ganglia. She was in hospital 3 days; after pt was transferred to [facility] where she did inpatient therapy; Pt then completed home PT until about 2 weeks ago; Pt has pain in shoulder when moving it with R UE and reports that deltoid is paralyzed; Pt fell out of bed one day and fell on L UE; Pt reported doctor put a shot in L shoulder and will receive 3 more shots in shoulder; Pt reports no sensation or numbness and tingling with LE just some sensation impaired in L hand; Pt has an air cast for L LE and splint for wrist but does not use because difficulty donning;  Pt has had trouble sleeping at night with waking and going to the bathroom but denies any pain;  Only  has pain in shoulder when moving described as throbbing; Pt reports not using her cane in the last month and states her balance is fine without it;    Limitations Lifting;Standing;Walking   How long can you sit comfortably? hours    How long can you stand comfortably? 30 min    How long can you walk comfortably? better in the morning because more energy;    Diagnostic tests x rays in shoulder and hand; all clear; doctor stated "inferior pseudo-subluxation from paralysis of the deltoid. she has advanced left hand arthritis"   Patient Stated Goals walking better without the cane;    Currently in Pain? No/denies  currently at rest            [facility] PT Assessment - 07/21/17 0001      Assessment   Medical Diagnosis L hemiparesis s/p CVA   Referring Provider Alysia Penna   Onset Date/Surgical Date 04/24/17   Hand Dominance Left    Next MD Visit Aug 2018   Prior Therapy Inpatient and home PT; pt still is doing some exercises from home PT     Precautions   Precautions Fall     Restrictions   Weight Bearing Restrictions No     Balance Screen   Has the patient fallen in the past 6 months Yes   How many times? 2   Has the patient had a decrease in activity level because of a fear of falling?  Yes   Is the patient reluctant to leave their home because of a fear of falling?  No     Home Environment   Living Environment Private residence   Living Arrangements Alone   Available Help at Discharge Family   Type of [place] to enter   Entrance Stairs-Number of Steps 3   Entrance Stairs-Rails Can reach both;Left;Right   Home Layout One level   [place] - quad;Shower seat;Grab bars - tub/shower;Toilet riser;Wheelchair - manual     Prior Function   Level of Independence Independent   Vocation Retired     Charity fundraiser Status Within Functional Limits for tasks assessed     Observation/Other Assessments   Observations CN- appears intact except CN11 impaired left side; hyperreflevie (3+) on L biceps and patella tendon, right side 2+ (normal);      Sensation   Light Touch Impaired by gross assessment  in LUE; intact in BLE   Additional Comments Sensation impaired in fingers of  LUE     Coordination   Gross Motor Movements are Fluid and Coordinated Yes     Posture/Postural Control   Posture Comments Observation in sitting- pt was holding flaccid L UE against body and L shoulder appeared lower than R;      AROM   Overall AROM  Deficits  severe limitations with L UE;    Overall AROM Comments R- UE & LE WFL; L LE WFL     Strength   Right Hip Flexion 5/5   Right Hip ABduction 5/5   Right Hip ADduction 5/5   Left Hip Flexion 4/5   Left Hip ABduction 4/5   Left Hip ADduction 4+/5   Right Knee Flexion 5/5   Right Knee Extension 5/5   Left Knee Flexion 4-/5    Left Knee Extension 4+/5   Right Ankle Dorsiflexion 5/5     Transfers   Five time sit to stand comments  Independent with no use of  arm rests     Ambulation/Gait   Ambulation/Gait Yes   Ambulation/Gait Assistance 7: Independent   Gait Pattern Step-through pattern;Within Functional Limits;Decreased arm swing - left;Decreased dorsiflexion - left   Gait Comments exhibits less weight shift to left side with increased LLE knee flexion in stance with decreased right step length; also exhibits increased left foot drag with prolonged ambulation due to less ankle DF during swing;      6 Minute Walk- Baseline   6 Minute Walk- Baseline yes   BP (mmHg) 151/65   HR (bpm) 62     6 Minute walk- Post Test   6 Minute Walk Post Test yes   BP (mmHg) 140/67     6 minute walk test results    Aerobic Endurance Distance Walked 565  no assistive device used; less than com. ambulator 1000 feet   Endurance additional comments pt had normal response to exercise with no signs or reports of fatigue     Balance   Balance Assessed Yes     Static Standing Balance   Static Standing - Balance Support No upper extremity supported  good minus      Dynamic Standing Balance   Dynamic Standing - Balance Support No upper extremity supported  good     Standardized Balance Assessment   Standardized Balance Assessment Five Times Sit to Stand;Berg Balance Test;10 meter walk test   Five times sit to stand comments  18 sec (>15sec is greater risk for fall)   10 Meter Walk .5 m/s (limited community ambulator) at higher risk for fall          PT TREATMENT;  Pt was instructed and demonstrated understanding of HEP;  Standing with red tband resistance; L Hip flexion x 10 L Hip extension x 10 L Hip abduction x 10 Bilat plantarflexion x 10  Seated; with red tband resistance; Dorsiflexion x 10;  Pt demonstrated all exercises with min cueing in order to improve technique and muscle activation;                       PT Education - 07/21/17 1203    Education provided Yes   Education Details HEP; gait safety; strengthening;   Person(s) Educated Patient   Methods Handout;Verbal cues;Explanation   Comprehension Verbalized understanding;Verbal cues required;Returned demonstration             PT Long Term Goals - 07/21/17 1245      PT LONG TERM GOAL #1   Title Pt will become independent with HEP in order to improve ADL independence;   Time 8   Period Weeks   Status New   Target Date 09/15/17     PT LONG TERM GOAL #2   Title Pt will decrease 5 times sit to stand to less than 15 sec with no UE use to decrease risk of fall;   Baseline 18 sec   Time 8   Period Weeks   Status New   Target Date 09/15/17     PT LONG TERM GOAL #3   Title Pt will improve gait speed in 10MWT to .8-1.58m/s in order to be considered a comunity ambulator;   Baseline .49m/s   Time 8   Period Weeks   Status New   Target Date 09/15/17     PT LONG TERM GOAL #4   Title Pt will improve 6MWT to 800 feet in order to improve activity tolerance;   Baseline 565 feet   Time  8   Period Weeks   Status New   Target Date 09/15/17     PT LONG TERM GOAL #5   Title Pt will increase strength in L LE to 4+/5 for all muscles in order to improve mobility and function with daily activities;    Time 8   Period Weeks   Status New   Target Date 09/15/17               Plan - 07/23/17 1220    Clinical Impression Statement Pt is a 71 year female; pt had a stroke on April 24, 2017 pt presents to therapy with deficits from stroke including L hemiparesis; Pt's L UE is completely flaccid from stroke and L LE has residual strength deficits; Pt 5x sit to stand places her at risk for fall but pt demonstrated good technique with no UE use; Pt gait speed was .5 m/s placing her in the limited community ambulator and increasing her risk for fall as well; Pt ambulated 565 feet during the 6MWT where 1000  feet is considered community ambulation after test vitals were normal and reported no fatigue; Pt's balance was not formally assesed but by gait observation pt balance may put her at risk for fall along with other assessments; Pt would benefit from skilled PT in order to improve strength, balance, and gait safety;    History and Personal Factors relevant to plan of care: comorbidities; stairs to enter/exit home; depression; lives alone;    Clinical Presentation Stable   Clinical Presentation due to: Pt's LE strength is not significantly limited; she is able to walk without assistive device with supervision; no falls in last month;    Clinical Decision Making Low   Rehab Potential Fair   Clinical Impairments Affecting Rehab Potential neg: age, lives alone, comorbidities, pos: wants to be more independent; already high functioning    PT Frequency 2x / week   PT Duration 8 weeks   PT Treatment/Interventions ADLs/Self Care Home Management;Aquatic Therapy;Moist Heat;Cryotherapy;DME Instruction;Gait training;Stair training;Functional mobility training;Therapeutic activities;Therapeutic exercise;Balance training;Neuromuscular re-education;Manual techniques;Passive range of motion;Energy conservation;Electrical Stimulation;Patient/family education;Orthotic Fit/Training   PT Next Visit Plan berg; stairs; strengthening   PT Home Exercise Plan see pt instructions   Consulted and Agree with Plan of Care Patient      Patient will benefit from skilled therapeutic intervention in order to improve the following deficits and impairments:  Abnormal gait, Impaired sensation, Decreased mobility, Decreased activity tolerance, Decreased endurance, Decreased range of motion, Decreased strength, Impaired UE functional use, Decreased balance, Decreased safety awareness, Difficulty walking, Pain  Visit Diagnosis: Hemiplegia and hemiparesis following cerebral infarction affecting left dominant side (HCC) - Plan: PT plan of  care cert/re-cert  Muscle weakness (generalized) - Plan: PT plan of care cert/re-cert  Other abnormalities of gait and mobility - Plan: PT plan of care cert/re-cert       G-Codes - 07-23-2017 1513    Functional Assessment Tool Used (Outpatient Only) 5 times sit<>Stand, 10 meter walk, strength/clinical judgement;    Functional Limitation Mobility: Walking and moving around   Mobility: Walking and Moving Around Current Status [phone]) At least 20 percent but less than 40 percent impaired, limited or restricted   Mobility: Walking and Moving Around Goal Status [phone]) At least 1 percent but less than 20 percent impaired, limited or restricted      Problem List Patient Active Problem List   Diagnosis Date Noted  . Urinary frequency 07/11/2017  . Gait disturbance, post-stroke 05/31/2017  . Left  spastic hemiparesis ([place]) 05/31/2017  . Adjustment disorder with mixed anxiety and depressed mood   . Hypokalemia   . HCAP (healthcare-associated pneumonia)   . Cough with fever   . 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 FUO (fever of unknown origin)   . Oral thrush   . Benign essential HTN   . Rheumatoid arthritis involving both hands ([place]) 04/28/2017  . 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[place]) 04/27/2017  . CVA (cerebral vascular accident) ([place]) 04/24/2017  . LGSIL Pap smear of vagina 12/15/2016  . Anal skin tag 02/02/2016  . Inflammatory arthritis 01/19/2016  . Right knee pain 01/19/2016  . Genital herpes 10/20/2015  . GERD (gastroesophageal reflux disease) 10/20/2015  . Status post vaginal hysterectomy 10/20/2015  . Health care maintenance 05/30/2015  . LLQ pain 03/31/2014  . Menopausal symptoms 03/31/2014  . Hypercholesterolemia 12/31/2012  . Hypothyroidism 12/29/2012  . Osteoarthritis 12/29/2012  . PULMONARY NODULE 01/02/2008  . Depression 12/30/2007  . Essential hypertension 12/30/2007   Doreene Nest SPT This entire session was performed under direct supervision and  direction of a licensed Chiropractor . I have personally read, edited and approve of the note as written.  Trotter,Margaret PT, DPT 07/21/2017, 3:19 PM  [place] MAIN [facility] SERVICES [address] [place], [place], [address] Phone: [phone]   Fax:  [phone]  Name: KOREENA JOOST MRN: [id number] Date of Birth: 1946/10/01

## 2017-07-26 ENCOUNTER — Other Ambulatory Visit: Payer: Self-pay

## 2017-07-26 MED ORDER — ATORVASTATIN CALCIUM 40 MG PO TABS: ORAL_TABLET | ORAL | 0 refills | Status: DC

## 2017-07-27 ENCOUNTER — Ambulatory Visit: Payer: PPO | Admitting: Occupational Therapy

## 2017-07-27 ENCOUNTER — Ambulatory Visit: Payer: PPO | Admitting: Physical Therapy

## 2017-07-27 DIAGNOSIS — M6281 Muscle weakness (generalized): Secondary | ICD-10-CM | POA: Diagnosis not present

## 2017-07-27 DIAGNOSIS — R2689 Other abnormalities of gait and mobility: Secondary | ICD-10-CM

## 2017-07-27 DIAGNOSIS — I69352 Hemiplegia and hemiparesis following cerebral infarction affecting left dominant side: Secondary | ICD-10-CM

## 2017-07-27 NOTE — Therapy (Signed)
[place] MAIN [facility] SERVICES [address] [place], [place], [address] Phone: [phone]   Fax:  [phone]  Physical Therapy Treatment  Patient Details  Name: Donna Rhodes MRN: [id number] Date of Birth: 1946/03/08 Referring Provider: Alysia Penna  Encounter Date: 07/27/2017      PT End of Session - 07/27/17 1214    Visit Number 2   Number of Visits 17   Date for PT Re-Evaluation 09/15/17   Authorization Type gcode 2   Authorization Time Period 10   PT Start Time 1113   PT Stop Time 1200   PT Time Calculation (min) 47 min   Equipment Utilized During Treatment Gait belt   Activity Tolerance Patient tolerated treatment well;No increased pain   Behavior During Therapy [facility] for tasks assessed/performed;Flat affect      Past Medical History:  Diagnosis Date  . Atrophic vaginitis   . Dysplasia of vagina    vin 1 - vulva and vaginal cuff  . GERD (gastroesophageal reflux disease)   . HCV infection   . HSV (herpes simplex virus) infection   . Hypercholesterolemia   . Hypertension   . Hypothyroidism   . Lung nodule    stable  . Menopausal state   . Osteoarthritis    hand involvement, cervical disc disease  . Osteoporosis   . Pelvic pain in female   . Rheumatoid arthritis ([place])   . 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 Thyroid nodule   . Vaginal Pap smear, abnormal    lgsil    Past Surgical History:  Procedure Laterality Date  . ADENOIDECTOMY    . APPENDECTOMY  1965  . [place]   left  . CHOLECYSTECTOMY    . [place]  . laser vein surgery    . MANDIBLE RECONSTRUCTION  1992  . TOENAIL EXCISION  1998   several  . Donna Rhodes  . VAGINAL HYSTERECTOMY  1983   als0- anterior/posterior colporrhaphy  . VAGINAL WOUND CLOSURE / REPAIR  1999, 2000  . VAGINAL WOUND CLOSURE / REPAIR      There were no vitals filed for this visit.      Subjective Assessment - 07/27/17 1118     Subjective Pt states she wants to work on walking and preventing her foot from rolling in; Pt denies any pain; Pt denies any falls; works in garden and has to walk around obstacles and has no trouble; pt states walking gets more difficult in the afternoon when she gets tired;     Pertinent History Pt is a 71 y.o female that presents to therapy s/p CVA; On April 24, 2017 pt suffered an acute infarct in right basal ganglia. 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; Pt fell out of bed one day and fell on L UE; Pt reported doctor put a shot in L shoulder and will receive 3 more shots in shoulder; Pt reports no sensation or numbness and tingling with LE just some sensation impaired in L hand; Pt has an air cast for L LE and splint for wrist but does not use because difficulty donning;  Pt has had trouble sleeping at night with waking and going to the bathroom but denies any pain;  Only has  pain in shoulder when moving described as throbbing; Pt reports not using her cane in the last month and states her balance is fine without it;    Limitations Lifting;Standing;Walking   How long can you sit comfortably? hours    How long can you stand comfortably? 30 min    How long can you walk comfortably? better in the morning because more energy;    Diagnostic tests x rays in shoulder and hand; all clear; doctor stated "inferior pseudo-subluxation from paralysis of the deltoid. she has advanced left hand arthritis"   Patient Stated Goals walking better without the cane;      PT TREATMENT;  Nu step; x 4 min with BLE lev 2 ( unbilled)    Pt instructed in berg balance test-see below Pt instructed in stair negotiation;  Stair negotiation; reciprocal gait pattern; Pt demonstrates slower descent on the way down and needs extra  focus in order to keep foot from inverting which pt takes extra time in order to focus on preventing that; pt states never rolling ankle in past;    Parallel bars;  Side Stepping with red tband; 4 x 10 feet; cued for larger step lengths and keep foot pointed forward;  Lateral tep downs 5" step; with left foot on step; 2 x 10; Pt uses r UE for support;  Step ups on to 5" step and down on to airex pad; cue to dorsiflex ankle and not invert foot when stepping down; Pt demonstrates good control when focusing on task;   heel to toe raises; cue to lift toes on heel rock; 2 x 15  standing on 1/2 foam roller rocks x 10; increase ankle flexibility; pt stated feeling tightness in the back of leg and when rocking on heel pt could still not keep foot from inverting;   Leg Press;  BLE plate 60#; 2 x 15  LLE plate 30#; 2 x 15  BLE straight leg calf press plate 60# 2 x 15 Pt cued for foot positioning and slow control for optimal muscle activation;       [facility] PT Assessment - 07/27/17 0001      Standardized Balance Assessment   Standardized Balance Assessment Berg Balance Test     Berg Balance Test   Sit to Stand Able to stand without using hands and stabilize independently   Standing Unsupported Able to stand safely 2 minutes   Sitting with Back Unsupported but Feet Supported on Floor or Stool Able to sit safely and securely 2 minutes   Stand to Sit Sits safely with minimal use of hands   Transfers Able to transfer safely, minor use of hands   Standing Unsupported with Eyes Closed Able to stand 10 seconds safely   Standing Ubsupported with Feet Together Able to place feet together independently and stand 1 minute safely   From Standing, Reach Forward with Outstretched Arm Can reach confidently >25 cm (10")   From Standing Position, Pick up Object from Floor Able to pick up shoe safely and easily   From Standing Position, Turn to Look Behind Over each Shoulder Looks behind from both sides and  weight shifts well   Turn 360 Degrees Able to turn 360 degrees safely but slowly   Standing Unsupported, Alternately Place Feet on Step/Stool Able to stand independently and complete 8 steps >20 seconds   Standing Unsupported, One Foot in Front Able to plae foot ahead of the other independently and hold 30 seconds   Standing on One Leg  Able to lift leg independently and hold 5-10 seconds   Total Score 51   Berg comment: moderate risk for fall                              PT Education - 07/27/17 1121    Education provided Yes   Education Details berg; strengthening; stair negotiation; gait; stroke support group   Person(s) Educated Patient   Methods Explanation;Verbal cues   Comprehension Verbalized understanding;Verbal cues required;Returned demonstration             PT Long Term Goals - 07/21/17 1245      PT LONG TERM GOAL #1   Title Pt will become independent with HEP in order to improve ADL independence;   Time 8   Period Weeks   Status New   Target Date 09/15/17     PT LONG TERM GOAL #2   Title Pt will decrease 5 times sit to stand to less than 15 sec with no UE use to decrease risk of fall;   Baseline 18 sec   Time 8   Period Weeks   Status New   Target Date 09/15/17     PT LONG TERM GOAL #3   Title Pt will improve gait speed in 10MWT to .8-1.53m/s in order to be considered a comunity ambulator;   Baseline .100m/s   Time 8   Period Weeks   Status New   Target Date 09/15/17     PT LONG TERM GOAL #4   Title Pt will improve 6MWT to 800 feet in order to improve activity tolerance;   Baseline 565 feet   Time 8   Period Weeks   Status New   Target Date 09/15/17     PT LONG TERM GOAL #5   Title Pt will increase strength in L LE to 4+/5 for all muscles in order to improve mobility and function with daily activities;    Time 8   Period Weeks   Status New   Target Date 09/15/17               Plan - 07/27/17 1215    Clinical  Impression Statement Pt presented to therapy today concerned about her gait; Pt will be educated next visit on walk aide and see if it helps decrease her ankle inversion and and increase ankle dorsiflexion; Pt was also instructed in berg balance which she scored a 51 (moderate risk for falls)which means she is on the borderline for low risk; Pt's main difficulties were balance with a narrow base of support;  Pt demonstrated stair negotiation with a good reciprocal gait pattern ascending and descending with a decreased speed descending for fear of ankle inversion; Pt was also instructed in LE strengthening exercises requiring min cues for foot placement and technique; Pt would continue to benefit from skilled PT in order to improve strength, balance, and gait safety;    Rehab Potential Fair   Clinical Impairments Affecting Rehab Potential neg: age, lives alone, comorbidities, pos: wants to be more independent; already high functioning    PT Frequency 2x / week   PT Duration 8 weeks   PT Treatment/Interventions ADLs/Self Care Home Management;Aquatic Therapy;Moist Heat;Cryotherapy;DME Instruction;Gait training;Stair training;Functional mobility training;Therapeutic activities;Therapeutic exercise;Balance training;Neuromuscular re-education;Manual techniques;Passive range of motion;Energy conservation;Electrical Stimulation;Patient/family education;Orthotic Fit/Training   PT Next Visit Plan berg; stairs; strengthening   PT Home Exercise Plan see pt instructions   Consulted and Agree with  Plan of Care Patient      Patient will benefit from skilled therapeutic intervention in order to improve the following deficits and impairments:  Abnormal gait, Impaired sensation, Decreased mobility, Decreased activity tolerance, Decreased endurance, Decreased range of motion, Decreased strength, Impaired UE functional use, Decreased balance, Decreased safety awareness, Difficulty walking, Pain  Visit  Diagnosis: Hemiplegia and hemiparesis following cerebral infarction affecting left dominant side (HCC)  Muscle weakness (generalized)  Other abnormalities of gait and mobility     Problem List Patient Active Problem List   Diagnosis Date Noted  . Urinary frequency 07/11/2017  . Gait disturbance, post-stroke 05/31/2017  . Left spastic hemiparesis ([place]) 05/31/2017  . Adjustment disorder with mixed anxiety and depressed mood   . Hypokalemia   . HCAP (healthcare-associated pneumonia)   . Cough with fever   . 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 FUO (fever of unknown origin)   . Oral thrush   . Benign essential HTN   . Rheumatoid arthritis involving both hands ([place]) 04/28/2017  . 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[place]) 04/27/2017  . CVA (cerebral vascular accident) ([place]) 04/24/2017  . LGSIL Pap smear of vagina 12/15/2016  . Anal skin tag 02/02/2016  . Inflammatory arthritis 01/19/2016  . Right knee pain 01/19/2016  . Genital herpes 10/20/2015  . GERD (gastroesophageal reflux disease) 10/20/2015  . Status post vaginal hysterectomy 10/20/2015  . Health care maintenance 05/30/2015  . LLQ pain 03/31/2014  . Menopausal symptoms 03/31/2014  . Hypercholesterolemia 12/31/2012  . Hypothyroidism 12/29/2012  . Osteoarthritis 12/29/2012  . PULMONARY NODULE 01/02/2008  . Depression 12/30/2007  . Essential hypertension 12/30/2007   Doreene Nest SPT This entire session was performed under direct supervision and direction of a licensed Chiropractor . I have personally read, edited and approve of the note as written.   Trotter,Margaret PT, DPT 07/27/2017, 3:08 PM   [place] MAIN [facility] SERVICES [address] [place], [place], [address] Phone: [phone]   Fax:  [phone]  Name: FERNANDE TREIBER MRN: [id number] Date of Birth: 12/24/1946

## 2017-07-27 NOTE — Therapy (Signed)
[place] MAIN [facility] SERVICES [address] [place], [place], [address] Phone: [phone]   Fax:  [phone]  Occupational Therapy Treatment  Patient Details  Name: Donna Rhodes MRN: [id number] Date of Birth: June 06, 1946 Referring Provider: Dr. Letta Pate  Encounter Date: 07/27/2017      OT End of Session - 07/27/17 1210    Visit Number 2   Number of Visits 24   Date for OT Re-Evaluation 10/13/17   Authorization Type Medicare G Code 2 of 10   OT Start Time 1018   OT Stop Time 1106   OT Time Calculation (min) 48 min   Activity Tolerance Patient tolerated treatment well   Behavior During Therapy [facility] for tasks assessed/performed      Past Medical History:  Diagnosis Date  . Atrophic vaginitis   . Dysplasia of vagina    vin 1 - vulva and vaginal cuff  . GERD (gastroesophageal reflux disease)   . HCV infection   . HSV (herpes simplex virus) infection   . Hypercholesterolemia   . Hypertension   . Hypothyroidism   . Lung nodule    stable  . Menopausal state   . Osteoarthritis    hand involvement, cervical disc disease  . Osteoporosis   . Pelvic pain in female   . Rheumatoid arthritis ([place])   . 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 Thyroid nodule   . Vaginal Pap smear, abnormal    lgsil    Past Surgical History:  Procedure Laterality Date  . ADENOIDECTOMY    . APPENDECTOMY  1965  . [place]   left  . CHOLECYSTECTOMY    . [place]  . laser vein surgery    . MANDIBLE RECONSTRUCTION  1992  . TOENAIL EXCISION  1998   several  . [place]  . VAGINAL HYSTERECTOMY  1983   als0- anterior/posterior colporrhaphy  . VAGINAL WOUND CLOSURE / REPAIR  1999, 2000  . VAGINAL WOUND CLOSURE / REPAIR      There were no vitals filed for this visit.      Subjective Assessment - 07/27/17 1208    Subjective  pt. reports she tries to keep her arm up near her stomach.   Patient is accompained by:  Family member   Pertinent History Pt. is a 71 y.o. female who had a CVA on 04/24/2017. Pt. went to the ER when she started not feeling well, and having heart palpitations. Pt. reports having work up initially, however was sent home. Pt. reports later in the day she was unable to write with her left hand. Pt. returned to the ER, and was admitted with workup for a CVA. Pt. was admitted to [facility], and stayed for approximately 3 weeks. Pt. returned home alone, and  received home health services for the past 2 months. Pt. now presents for Outpatient OT services. Pt. resides home alone, and has support children who stop by daily, assist with shopping, transportation, and  ADL/IADL tasks as needed.    Limitations Pt. is unable to use her dominant RUE during functional tasks.   Currently in Pain? No/denies   Pain Score 6    Pain Location Shoulder   Pain Orientation Left   Pain Descriptors / Indicators Aching;Sore        OT TREATMENT    Therapeutic Exercise:  Pt. Worked on PROM in supine for shoulder flexion, abduction, AAROM for horizontal abduction, and adduction. Pt. worked  on AROM for elbow extension, elbow flexion in preparation for hand to face patterns, forearm supination wrist extension with facilitation through gentle tapping, and and thumb abduction. PROM to wrist and digits.  Manual Therapy:  Pt. tolerated scapular mobes in elevation, depression, abduction, and rotation in sitting, and sidelying. Pt. Tolerated gentle retrograde massage to the Left hand secondary to edema.                           OT Education - 07/27/17 1209    Education provided Yes   Education Details LUE ROM, and hand function   Person(s) Educated Patient   Methods Explanation;Verbal cues   Comprehension Verbalized understanding;Verbal cues required;Returned demonstration             OT Long Term Goals - 07/21/17 1411      OT LONG TERM GOAL #1   Title Pt. will  tolerate increased shoulder PROM by 20 degrees with pain improved to under 3/10   Baseline Flexion: 91, Abduction: 67   Time 12   Period Weeks   Status New   Target Date 10/13/17     OT LONG TERM GOAL #2   Title Pt. will formulate a full fist to be able to stabilize objects with her Left hand while using her right hand.    Baseline Digit flexion to [facility]: 2nd digit: 4cm, 3rd: 5.5, 4th: 6, 5th: 4.   Time 12   Period Weeks   Status New   Target Date 10/13/17     OT LONG TERM GOAL #3   Title Pt. will be able to wipe face independently with her Left UE.   Baseline Pt. is unable   Time 12   Period Weeks   Status New     OT LONG TERM GOAL #4   Title Pt. will demonstrate independence with splint application.   Baseline Pt. is unable to donn splint    Time 12   Period Weeks   Status New   Target Date 10/13/17     OT LONG TERM GOAL #5   Title Pt. will initiate active grasp release with thumb to 2nd digit 100% of the time to be able to hold a utensil in preparation for self-feeding.   Baseline Minimal thum movement.   Time 12   Period Weeks   Status New   Target Date 10/13/17     Long Term Additional Goals   Additional Long Term Goals Yes     OT LONG TERM GOAL #6   Title Pt. will initiate wrist extension in preparation for functional reaching.   Baseline No active wrist extension.   Time 12   Period Weeks   Status New   Target Date 10/13/17               Plan - 07/27/17 1211    Clinical Impression Statement Pt. continues to presents with Left shoulder pain intermittently with movement. Pt. presents with active elbow flexion, extension, forearm supination, thumb flexion, and inconsistent thumb abduction, and trace wrist extension. Pt. conitnues to work on improving LUE functioning for increased engagement in ADL tasks.       Patient will benefit from skilled therapeutic intervention in order to improve the following deficits and impairments:     Visit  Diagnosis: Muscle weakness (generalized)    Problem List Patient Active Problem List   Diagnosis Date Noted  . Urinary frequency 07/11/2017  . Gait disturbance, post-stroke 05/31/2017  .  Left spastic hemiparesis ([place]) 05/31/2017  . Adjustment disorder with mixed anxiety and depressed mood   . Hypokalemia   . HCAP (healthcare-associated pneumonia)   . Cough with fever   . 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 FUO (fever of unknown origin)   . Oral thrush   . Benign essential HTN   . Rheumatoid arthritis involving both hands ([place]) 04/28/2017  . 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[place]) 04/27/2017  . CVA (cerebral vascular accident) ([place]) 04/24/2017  . LGSIL Pap smear of vagina 12/15/2016  . Anal skin tag 02/02/2016  . Inflammatory arthritis 01/19/2016  . Right knee pain 01/19/2016  . Genital herpes 10/20/2015  . GERD (gastroesophageal reflux disease) 10/20/2015  . Status post vaginal hysterectomy 10/20/2015  . Health care maintenance 05/30/2015  . LLQ pain 03/31/2014  . Menopausal symptoms 03/31/2014  . Hypercholesterolemia 12/31/2012  . Hypothyroidism 12/29/2012  . Osteoarthritis 12/29/2012  . PULMONARY NODULE 01/02/2008  . Depression 12/30/2007  . Essential hypertension 12/30/2007    Harrel Carina, MS, OTR/L 07/27/2017, 12:17 PM  [place] MAIN [facility] SERVICES [address] [place], [place], [address] Phone: [phone]   Fax:  [phone]  Name: Donna Rhodes MRN: [id number] Date of Birth: [birth year]

## 2017-07-29 ENCOUNTER — Ambulatory Visit: Payer: PPO | Admitting: Physical Therapy

## 2017-07-29 ENCOUNTER — Encounter: Payer: Self-pay | Admitting: Physical Therapy

## 2017-07-29 ENCOUNTER — Ambulatory Visit: Payer: PPO | Attending: Physical Medicine & Rehabilitation | Admitting: Occupational Therapy

## 2017-07-29 DIAGNOSIS — I639 Cerebral infarction, unspecified: Secondary | ICD-10-CM | POA: Insufficient documentation

## 2017-07-29 DIAGNOSIS — I69352 Hemiplegia and hemiparesis following cerebral infarction affecting left dominant side: Secondary | ICD-10-CM | POA: Diagnosis not present

## 2017-07-29 DIAGNOSIS — R278 Other lack of coordination: Secondary | ICD-10-CM | POA: Diagnosis not present

## 2017-07-29 DIAGNOSIS — M6281 Muscle weakness (generalized): Secondary | ICD-10-CM

## 2017-07-29 DIAGNOSIS — R2689 Other abnormalities of gait and mobility: Secondary | ICD-10-CM | POA: Insufficient documentation

## 2017-07-29 NOTE — Therapy (Signed)
[place] MAIN [facility] SERVICES [address] [place], [place], [address] Phone: [phone]   Fax:  [phone]  Occupational Therapy Treatment  Patient Details  Name: Donna Rhodes MRN: [id number] Date of Birth: Aug 21, 1946 Referring Provider: Dr. Letta Pate  Encounter Date: 07/29/2017      OT End of Session - 07/29/17 1159    Visit Number 3   Number of Visits 24   Date for OT Re-Evaluation 10/13/17   Authorization Type Medicare G Code 3 of 10   OT Start Time 4401   OT Stop Time 1100   OT Time Calculation (min) 45 min   Activity Tolerance Patient tolerated treatment well   Behavior During Therapy [facility] for tasks assessed/performed;Flat affect      Past Medical History:  Diagnosis Date  . Atrophic vaginitis   . Dysplasia of vagina    vin 1 - vulva and vaginal cuff  . GERD (gastroesophageal reflux disease)   . HCV infection   . HSV (herpes simplex virus) infection   . Hypercholesterolemia   . Hypertension   . Hypothyroidism   . Lung nodule    stable  . Menopausal state   . Osteoarthritis    hand involvement, cervical disc disease  . Osteoporosis   . Pelvic pain in female   . Rheumatoid arthritis ([place])   . 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 Thyroid nodule   . Vaginal Pap smear, abnormal    lgsil    Past Surgical History:  Procedure Laterality Date  . ADENOIDECTOMY    . APPENDECTOMY  1965  . [place]   left  . CHOLECYSTECTOMY    . [place]  . laser vein surgery    . MANDIBLE RECONSTRUCTION  1992  . TOENAIL EXCISION  1998   several  . [place]  . VAGINAL HYSTERECTOMY  1983   als0- anterior/posterior colporrhaphy  . VAGINAL WOUND CLOSURE / REPAIR  1999, 2000  . VAGINAL WOUND CLOSURE / REPAIR      There were no vitals filed for this visit.      Subjective Assessment - 07/29/17 1156    Subjective  Pt. asked if using a weight would help her left arm. Pt. was advised not to use  weights with her left arm at this time.   Patient is accompained by: Family member   Pertinent History Pt. is a 71 y.o. female who had a CVA on 04/24/2017. Pt. went to the ER when she started not feeling well, and having heart palpitations. Pt. reports having work up initially, however was sent home. Pt. reports later in the day she was unable to write with her left hand. Pt. returned to the ER, and was admitted with workup for a CVA. Pt. was admitted to [facility], and stayed for approximately 3 weeks. Pt. returned home alone, and  received home health services for the past 2 months. Pt. now presents for Outpatient OT services. Pt. resides home alone, and has support children who stop by daily, assist with shopping, transportation, and  ADL/IADL tasks as needed.    Limitations Pt. is unable to use her dominant RUE during functional tasks.   Patient Stated Goals To get back the way she was. (Independent)   Currently in Pain? No/denies      OT TREATMENT    Neuro muscular re-education:  Therapeutic Exercise:  Pt. Worked on PROM in supine for shoulder flexion, abduction, AAROM for  horizontal abduction, and adduction. Pt. worked on AROM for elbow extension, elbow flexion in preparation for hand to face patterns, forearm supination, wrist extension with facilitation through gentle tapping, and and thumb abduction. PROM to wrist and digits. Pt. Presents with more consistent active wrist extension responses.   Manual Therapy:   Pt. tolerated scapular mobes in elevation, depression, abduction, and rotation in sitting, and sidelying. Pt. Tolerated gentle retrograde massage to the Left hand secondary to edema.                             OT Education - 07/29/17 1159    Education provided Yes   Education Details LUE function   Person(s) Educated Patient   Methods Verbal cues;Explanation   Comprehension Verbal cues required;Verbalized understanding;Tactile  cues required             OT Long Term Goals - 07/21/17 1411      OT LONG TERM GOAL #1   Title Pt. will tolerate increased shoulder PROM by 20 degrees with pain improved to under 3/10   Baseline Flexion: 91, Abduction: 67   Time 12   Period Weeks   Status New   Target Date 10/13/17     OT LONG TERM GOAL #2   Title Pt. will formulate a full fist to be able to stabilize objects with her Left hand while using her right hand.    Baseline Digit flexion to [facility]: 2nd digit: 4cm, 3rd: 5.5, 4th: 6, 5th: 4.   Time 12   Period Weeks   Status New   Target Date 10/13/17     OT LONG TERM GOAL #3   Title Pt. will be able to wipe face independently with her Left UE.   Baseline Pt. is unable   Time 12   Period Weeks   Status New     OT LONG TERM GOAL #4   Title Pt. will demonstrate independence with splint application.   Baseline Pt. is unable to donn splint    Time 12   Period Weeks   Status New   Target Date 10/13/17     OT LONG TERM GOAL #5   Title Pt. will initiate active grasp release with thumb to 2nd digit 100% of the time to be able to hold a utensil in preparation for self-feeding.   Baseline Minimal thum movement.   Time 12   Period Weeks   Status New   Target Date 10/13/17     Long Term Additional Goals   Additional Long Term Goals Yes     OT LONG TERM GOAL #6   Title Pt. will initiate wrist extension in preparation for functional reaching.   Baseline No active wrist extension.   Time 12   Period Weeks   Status New   Target Date 10/13/17               Plan - 07/29/17 1200    Clinical Impression Statement Pt. reports she has been using horse lindament/ointment on her arm, and hand. Pt. reports she got it from [employer]. Pt. continues to present with intermittent left shoulder pain with movement. Pt. continues to present with active elbow flexion, extension, forearm supination, thumb flexion, abduction, and more consistent trace extension. Pt.  continues to work on improving LUE ROM in preparation for use during ADLs, and IADLs.   Occupational performance deficits (Please refer to evaluation for details): ADL's;IADL's   Rehab Potential  Good   OT Frequency 2x / week   OT Duration 12 weeks   Consulted and Agree with Plan of Care Patient      Patient will benefit from skilled therapeutic intervention in order to improve the following deficits and impairments:  Decreased cognition, Difficulty walking, Decreased balance, Decreased strength, Decreased safety awareness, Decreased coordination, Impaired UE functional use, Impaired tone, Decreased range of motion, Decreased endurance, Decreased activity tolerance, Impaired perceived functional ability, Pain  Visit Diagnosis: Muscle weakness (generalized)    Problem List Patient Active Problem List   Diagnosis Date Noted  . Urinary frequency 07/11/2017  . Gait disturbance, post-stroke 05/31/2017  . Left spastic hemiparesis ([place]) 05/31/2017  . Adjustment disorder with mixed anxiety and depressed mood   . Hypokalemia   . HCAP (healthcare-associated pneumonia)   . Cough with fever   . 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 FUO (fever of unknown origin)   . Oral thrush   . Benign essential HTN   . Rheumatoid arthritis involving both hands ([place]) 04/28/2017  . 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[place]) 04/27/2017  . CVA (cerebral vascular accident) ([place]) 04/24/2017  . LGSIL Pap smear of vagina 12/15/2016  . Anal skin tag 02/02/2016  . Inflammatory arthritis 01/19/2016  . Right knee pain 01/19/2016  . Genital herpes 10/20/2015  . GERD (gastroesophageal reflux disease) 10/20/2015  . Status post vaginal hysterectomy 10/20/2015  . Health care maintenance 05/30/2015  . LLQ pain 03/31/2014  . Menopausal symptoms 03/31/2014  . Hypercholesterolemia 12/31/2012  . Hypothyroidism 12/29/2012  . Osteoarthritis 12/29/2012  . PULMONARY NODULE 01/02/2008  . Depression 12/30/2007  .  Essential hypertension 12/30/2007    Harrel Carina 07/29/2017, 12:08 PM  [place] MAIN [facility] SERVICES [address] [place], [place], [address] Phone: [phone]   Fax:  [phone]  Name: Donna Rhodes MRN: [id number] Date of Birth: 02/20/1946

## 2017-07-29 NOTE — Therapy (Signed)
[place] MAIN [facility] SERVICES [address] [place], [place], [address] Phone: [phone]   Fax:  [phone]  Physical Therapy Treatment  Patient Details  Name: Donna Rhodes MRN: [id number] Date of Birth: 1946/06/12 Referring Provider: Alysia Penna  Encounter Date: 07/29/2017      PT End of Session - 07/29/17 1124    Visit Number 3   Number of Visits 17   Date for PT Re-Evaluation 09/15/17   Authorization Type gcode 3   Authorization Time Period 10   PT Start Time 1115   PT Stop Time 1200   PT Time Calculation (min) 45 min   Equipment Utilized During Treatment Gait belt   Activity Tolerance Patient tolerated treatment well;No increased pain   Behavior During Therapy [facility] for tasks assessed/performed;Flat affect      Past Medical History:  Diagnosis Date  . Atrophic vaginitis   . Dysplasia of vagina    vin 1 - vulva and vaginal cuff  . GERD (gastroesophageal reflux disease)   . HCV infection   . HSV (herpes simplex virus) infection   . Hypercholesterolemia   . Hypertension   . Hypothyroidism   . Lung nodule    stable  . Menopausal state   . Osteoarthritis    hand involvement, cervical disc disease  . Osteoporosis   . Pelvic pain in female   . Rheumatoid arthritis ([place])   . Stroke (cerebrum) ([place]) 04/24/2017  . Thyroid nodule   . Vaginal Pap smear, abnormal    lgsil    Past Surgical History:  Procedure Laterality Date  . ADENOIDECTOMY    . APPENDECTOMY  1965  . [place]   left  . CHOLECYSTECTOMY    . [place]  . laser vein surgery    . MANDIBLE RECONSTRUCTION  1992  . TOENAIL EXCISION  1998   several  . [place]  . VAGINAL HYSTERECTOMY  1983   als0- anterior/posterior colporrhaphy  . VAGINAL WOUND CLOSURE / REPAIR  1999, 2000  . VAGINAL WOUND CLOSURE / REPAIR      There were no vitals filed for this visit.      Subjective Assessment - 07/29/17 1122     Subjective Pt reports being tired from waking up multiple times a night to use the restroom;    Pertinent History Pt is a 71 y.o female that presents to therapy s/p CVA; On April 24, 2017 pt suffered an acute infarct in right basal ganglia. She was in hospital 3 days; after pt was transferred to [facility] where she did inpatient therapy; Pt then completed home PT until about 2 weeks ago; Pt has pain in shoulder when moving it with R UE and reports that deltoid is paralyzed; Pt fell out of bed one day and fell on L UE; Pt reported doctor put a shot in L shoulder and will receive 3 more shots in shoulder; Pt reports no sensation or numbness and tingling with LE just some sensation impaired in L hand; Pt has an air cast for L LE and splint for wrist but does not use because difficulty donning;  Pt has had trouble sleeping at night with waking and going to the bathroom but denies any pain;  Only has pain in shoulder when moving described as throbbing; Pt reports not using her cane in the last month and states her balance is fine without it;    Limitations Lifting;Standing;Walking   How long  can you sit comfortably? hours    How long can you stand comfortably? 30 min    How long can you walk comfortably? better in the morning because more energy;    Diagnostic tests x rays in shoulder and hand; all clear; doctor stated "inferior pseudo-subluxation from paralysis of the deltoid. she has advanced left hand arthritis"   Patient Stated Goals walking better without the cane;    Currently in Pain? No/denies       PT TREATMENT;  Pt was fitted for walk aide with good activation of ankle dorsiflexion;  Pt was instructed in gait for walk aide using hand sensor to calibrate walkaide for tilt mode;   5 x 80 feet; pt demonstrated better dorsiflexion and heel-to-toe gait pattern with walk aide set at intensity 2; Pt required cues for keep leg extended until swing phase so that the walk aide would activate at the  right time during gait cycle;  Pt was instructed in stairs;  3 x 4 steps ascending and descending reciprocally; pt demonstrated better dorsiflexion while descending stairs;  Pt cued when hearing the beep to quickly descend stair because the walkaide would turn off before placing foot on next step   Pt gait without walkaide x 80 feet; Pt still demonstrated some inversion during gait but better toe off with gait and less foot drag; She denies any pain at end of session and exhibits good tissue integrity with removal of electrodes.                          PT Education - 07/29/17 1123    Education provided Yes   Education Details gait with walk aide;   Person(s) Educated Patient   Methods Explanation;Verbal cues   Comprehension Returned demonstration;Verbalized understanding;Verbal cues required             PT Long Term Goals - 07/21/17 1245      PT LONG TERM GOAL #1   Title Pt will become independent with HEP in order to improve ADL independence;   Time 8   Period Weeks   Status New   Target Date 09/15/17     PT LONG TERM GOAL #2   Title Pt will decrease 5 times sit to stand to less than 15 sec with no UE use to decrease risk of fall;   Baseline 18 sec   Time 8   Period Weeks   Status New   Target Date 09/15/17     PT LONG TERM GOAL #3   Title Pt will improve gait speed in 10MWT to .8-1.29m/s in order to be considered a comunity ambulator;   Baseline .89m/s   Time 8   Period Weeks   Status New   Target Date 09/15/17     PT LONG TERM GOAL #4   Title Pt will improve 6MWT to 800 feet in order to improve activity tolerance;   Baseline 565 feet   Time 8   Period Weeks   Status New   Target Date 09/15/17     PT LONG TERM GOAL #5   Title Pt will increase strength in L LE to 4+/5 for all muscles in order to improve mobility and function with daily activities;    Time 8   Period Weeks   Status New   Target Date 09/15/17                Plan - 07/29/17 1124  Clinical Impression Statement Pt today was fitted and instructed on gait with walk aid; Pt demonstrated better swing phase dorsiflexion with walk aid and better stair negotiation; Pt required min cues for leg extension during end of swing phase and initial stance stance phase for better walkaide activation/deactivation; Pt demonstrated good carryover with dorsiflexion during gait without walkaide; Pt would continue to benefit exercises and gait training with walkaide;    Rehab Potential Fair   Clinical Impairments Affecting Rehab Potential neg: age, lives alone, comorbidities, pos: wants to be more independent; already high functioning    PT Frequency 2x / week   PT Duration 8 weeks   PT Treatment/Interventions ADLs/Self Care Home Management;Aquatic Therapy;Moist Heat;Cryotherapy;DME Instruction;Gait training;Stair training;Functional mobility training;Therapeutic activities;Therapeutic exercise;Balance training;Neuromuscular re-education;Manual techniques;Passive range of motion;Energy conservation;Electrical Stimulation;Patient/family education;Orthotic Fit/Training   PT Next Visit Plan berg; stairs; strengthening   PT Home Exercise Plan see pt instructions   Consulted and Agree with Plan of Care Patient      Patient will benefit from skilled therapeutic intervention in order to improve the following deficits and impairments:  Abnormal gait, Impaired sensation, Decreased mobility, Decreased activity tolerance, Decreased endurance, Decreased range of motion, Decreased strength, Impaired UE functional use, Decreased balance, Decreased safety awareness, Difficulty walking, Pain  Visit Diagnosis: Hemiplegia and hemiparesis following cerebral infarction affecting left dominant side (HCC)  Muscle weakness (generalized)  Other abnormalities of gait and mobility     Problem List Patient Active Problem List   Diagnosis Date Noted  . Urinary frequency 07/11/2017  . Gait  disturbance, post-stroke 05/31/2017  . Left spastic hemiparesis ([place]) 05/31/2017  . Adjustment disorder with mixed anxiety and depressed mood   . Hypokalemia   . HCAP (healthcare-associated pneumonia)   . Cough with fever   . 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 FUO (fever of unknown origin)   . Oral thrush   . Benign essential HTN   . Rheumatoid arthritis involving both hands ([place]) 04/28/2017  . 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[place]) 04/27/2017  . CVA (cerebral vascular accident) ([place]) 04/24/2017  . LGSIL Pap smear of vagina 12/15/2016  . Anal skin tag 02/02/2016  . Inflammatory arthritis 01/19/2016  . Right knee pain 01/19/2016  . Genital herpes 10/20/2015  . GERD (gastroesophageal reflux disease) 10/20/2015  . Status post vaginal hysterectomy 10/20/2015  . Health care maintenance 05/30/2015  . LLQ pain 03/31/2014  . Menopausal symptoms 03/31/2014  . Hypercholesterolemia 12/31/2012  . Hypothyroidism 12/29/2012  . Osteoarthritis 12/29/2012  . PULMONARY NODULE 01/02/2008  . Depression 12/30/2007  . Essential hypertension 12/30/2007   Doreene Nest SPT This entire session was performed under direct supervision and direction of a licensed Chiropractor . I have personally read, edited and approve of the note as written.  Trotter,Margaret PT, DPT 07/29/2017, 2:09 PM  [place] MAIN [facility] SERVICES [address] [place], [place], [address] Phone: [phone]   Fax:  [phone]  Name: Donna Rhodes MRN: [id number] Date of Birth: 12/06/1946

## 2017-08-02 ENCOUNTER — Encounter: Payer: Self-pay | Admitting: Internal Medicine

## 2017-08-02 ENCOUNTER — Ambulatory Visit (INDEPENDENT_AMBULATORY_CARE_PROVIDER_SITE_OTHER): Payer: PPO | Admitting: Internal Medicine

## 2017-08-02 VITALS — BP 120/74 | HR 71 | Temp 97.8°F | Resp 12 | Ht 63.0 in | Wt 113.0 lb

## 2017-08-02 DIAGNOSIS — E039 Hypothyroidism, unspecified: Secondary | ICD-10-CM | POA: Diagnosis not present

## 2017-08-02 DIAGNOSIS — I10 Essential (primary) hypertension: Secondary | ICD-10-CM

## 2017-08-02 DIAGNOSIS — D649 Anemia, unspecified: Secondary | ICD-10-CM

## 2017-08-02 DIAGNOSIS — R350 Frequency of micturition: Secondary | ICD-10-CM | POA: Diagnosis not present

## 2017-08-02 DIAGNOSIS — G8114 Spastic hemiplegia affecting left nondominant side: Secondary | ICD-10-CM

## 2017-08-02 DIAGNOSIS — K219 Gastro-esophageal reflux disease without esophagitis: Secondary | ICD-10-CM | POA: Diagnosis not present

## 2017-08-02 DIAGNOSIS — I639 Cerebral infarction, unspecified: Secondary | ICD-10-CM

## 2017-08-02 DIAGNOSIS — I69398 Other sequelae of cerebral infarction: Secondary | ICD-10-CM | POA: Diagnosis not present

## 2017-08-02 DIAGNOSIS — R269 Unspecified abnormalities of gait and mobility: Secondary | ICD-10-CM

## 2017-08-02 DIAGNOSIS — F320 Major depressive disorder, single episode, mild: Secondary | ICD-10-CM | POA: Diagnosis not present

## 2017-08-02 DIAGNOSIS — M069 Rheumatoid arthritis, unspecified: Secondary | ICD-10-CM | POA: Diagnosis not present

## 2017-08-02 DIAGNOSIS — I6381 Other cerebral infarction due to occlusion or stenosis of small artery: Secondary | ICD-10-CM

## 2017-08-02 DIAGNOSIS — E7800 Pure hypercholesterolemia, unspecified: Secondary | ICD-10-CM | POA: Diagnosis not present

## 2017-08-02 DIAGNOSIS — F32A Depression, unspecified: Secondary | ICD-10-CM

## 2017-08-02 MED ORDER — LEVOTHYROXINE SODIUM 50 MCG PO TABS: 50.0000 ug | ORAL_TABLET | Freq: Every day | ORAL | 1 refills | Status: DC

## 2017-08-02 MED ORDER — CLOPIDOGREL BISULFATE 75 MG PO TABS: 75.0000 mg | ORAL_TABLET | Freq: Every day | ORAL | 1 refills | Status: DC

## 2017-08-02 MED ORDER — ATORVASTATIN CALCIUM 20 MG PO TABS: 20.0000 mg | ORAL_TABLET | Freq: Every day | ORAL | 1 refills | Status: DC

## 2017-08-02 NOTE — Assessment & Plan Note (Signed)
Working with therapy.

## 2017-08-02 NOTE — Assessment & Plan Note (Signed)
Resulting left arm/leg weakness.  Continue aspirin and plavix.  Continue PT.

## 2017-08-02 NOTE — Assessment & Plan Note (Signed)
Recent CVA.  On aspirin and plavix.  Continue therapy.

## 2017-08-02 NOTE — Progress Notes (Signed)
Patient ID: Donna Rhodes, female   DOB: [birth year], 71 y.o.   MRN: [id number]   Subjective:    Patient ID: Donna Rhodes, female    DOB: January 13, 1946, 71 y.o.   MRN: [id number]  HPI  Patient here for a scheduled follow up.  She is accompanied by her granddaughter.  She is going to physical therapy - outpatient.  She is getting around better.  Increased pain in her left upper arm. S/p injection.  May have helped some.  Has f/u scheduled.  Will receive another injection.  Just saw Dr Ubaldo Glassing.  Recent ECHO - EF 50-55%.  Holter with PVCs and PACs.  No significant arrhythmia.  He decreased her amlodipine to 2.5mg  q day secondary to her complaint of fatigue.  Still reports some fatigue.  Some decreased appetite.  She is concerned her medication is causing her symptoms.  She has stopped her citalopram.  Discussed with her today.  She does not want to be on the medication now.  She also decreased her lipitor to 1/2 tablet.  No abdominal pain.  Bowels moving.  Recently saw Dr Lavonne Chick.  No changes made.  Recommended continuing MTX.     Past Medical History:  Diagnosis Date  . Atrophic vaginitis   . Dysplasia of vagina    vin 1 - vulva and vaginal cuff  . GERD (gastroesophageal reflux disease)   . HCV infection   . HSV (herpes simplex virus) infection   . Hypercholesterolemia   . Hypertension   . Hypothyroidism   . Lung nodule    stable  . Menopausal state   . Osteoarthritis    hand involvement, cervical disc disease  . Osteoporosis   . Pelvic pain in female   . Rheumatoid arthritis ([place])   . 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 Thyroid nodule   . Vaginal Pap smear, abnormal    lgsil   Past Surgical History:  Procedure Laterality Date  . ADENOIDECTOMY    . APPENDECTOMY  1965  . [place]   left  . CHOLECYSTECTOMY    . [place]  . laser vein surgery    . MANDIBLE RECONSTRUCTION  1992  . TOENAIL EXCISION  1998   several  . [place]  . VAGINAL  HYSTERECTOMY  1983   als0- anterior/posterior colporrhaphy  . VAGINAL WOUND CLOSURE / REPAIR  1999, 2000  . VAGINAL WOUND CLOSURE / REPAIR     Family History  Problem Relation Age of Onset  . Hypertension Father   . Osteoarthritis Mother   . Arthritis/Rheumatoid Sister   . Breast cancer Unknown        paternal side  . Breast cancer Paternal Aunt   . Breast cancer Cousin        maternal  . Uterine cancer Maternal Grandmother   . Ovarian cancer Neg Hx   . Heart disease Neg Hx   . Diabetes Neg Hx   . Colon cancer Neg Hx    Social History   Social History  . Marital status: Divorced    Spouse name: N/A  . Number of children: 3  . Years of education: N/A   Social History Main Topics  . Smoking status: Former Smoker    Quit date: 12/29/1979  . Smokeless tobacco: Never Used  . Alcohol use No  . Drug use: No  . Sexual activity: Not Currently    Birth control/ protection: Surgical   Other Topics Concern  .  None   Social History Narrative  . None    Outpatient Encounter Prescriptions as of 08/02/2017  Medication Sig  . amLODipine (NORVASC) 5 MG tablet TAKE 1 TABLET BY MOUTH ONCE DAILY (Patient taking differently: TAKE 1/2 TABLET BY MOUTH ONCE DAILY)  . aspirin 81 MG EC tablet Take 1 tablet (81 mg total) by mouth daily.  . calcium-vitamin D (OSCAL WITH D) 250-125 MG-UNIT tablet Take 1 tablet by mouth daily.  . clopidogrel (PLAVIX) 75 MG tablet Take 1 tablet (75 mg total) by mouth daily.  . Famotidine (PEPCID PO) Take by mouth 2 (two) times daily.  . folic acid (FOLVITE) 1 MG tablet Take 1 mg by mouth daily.  Marland Kitchen levothyroxine (SYNTHROID, LEVOTHROID) 50 MCG tablet Take 1 tablet (50 mcg total) by mouth daily before breakfast.  . methotrexate 50 MG/2ML injection Inject 0.48mL under the skin once a week.  . [DISCONTINUED] atorvastatin (LIPITOR) 40 MG tablet TAKE 1 TABLET BY MOUTH ONCE DAILY AT  6PM (Patient taking differently: TAKE 1 TABLET BY MOUTH ONCE DAILY AT  6PM)  .  [DISCONTINUED] clopidogrel (PLAVIX) 75 MG tablet TAKE 1 TABLET BY MOUTH ONCE DAILY  . [DISCONTINUED] levothyroxine (SYNTHROID, LEVOTHROID) 50 MCG tablet Take 1 tablet (50 mcg total) by mouth daily before breakfast.  . atorvastatin (LIPITOR) 20 MG tablet Take 1 tablet (20 mg total) by mouth daily.  . [DISCONTINUED] amLODipine (NORVASC) 2.5 MG tablet Take 2.5 mg by mouth daily.  . [DISCONTINUED] citalopram (CELEXA) 10 MG tablet TAKE 1 TABLET BY MOUTH ONCE DAILY (Patient not taking: Reported on 08/02/2017)  . [DISCONTINUED] pantoprazole (PROTONIX) 40 MG tablet TAKE 1 TABLET BY MOUTH ONCE DAILY (Patient not taking: Reported on 08/02/2017)   No facility-administered encounter medications on file as of 08/02/2017.     Review of Systems  Constitutional:       Decreased appetite.  Weight stable from the previous check.    HENT: Negative for congestion and sinus pressure.   Respiratory: Negative for cough, chest tightness and shortness of breath.   Cardiovascular: Negative for chest pain, palpitations and leg swelling.  Gastrointestinal: Negative for abdominal pain, diarrhea, nausea and vomiting.  Genitourinary: Positive for frequency. Negative for difficulty urinating and dysuria.  Musculoskeletal: Negative for myalgias.       Left upper arm pain.    Skin: Negative for color change and rash.  Neurological: Negative for dizziness, light-headedness and headaches.  Psychiatric/Behavioral: Negative for agitation.       Increased stress related to her recent stroke.         Objective:    Physical Exam  Constitutional: She appears well-developed and well-nourished. No distress.  HENT:  Nose: Nose normal.  Mouth/Throat: Oropharynx is clear and moist.  Neck: Neck supple. No thyromegaly present.  Cardiovascular: Normal rate and regular rhythm.   Pulmonary/Chest: Breath sounds normal. No respiratory distress. She has no wheezes.  Abdominal: Soft. Bowel sounds are normal. There is no tenderness.    Musculoskeletal: She exhibits no edema or tenderness.  Left arm weakness.    Lymphadenopathy:    She has no cervical adenopathy.  Skin: No rash noted. No erythema.  Psychiatric: She has a normal mood and affect.    BP 120/74 (BP Location: Right Arm, Patient Position: Sitting, Cuff Size: Normal)   Pulse 71   Temp 97.8 F (36.6 C) (Oral)   Resp 12   Ht 5\' 3"  (1.6 m)   Wt 113 lb (51.3 kg)   LMP 12/29/1981   SpO2 97%  BMI 20.02 kg/m  Wt Readings from Last 3 Encounters:  08/02/17 113 lb (51.3 kg)  07/09/17 112 lb (50.8 kg)  07/01/17 116 lb 1.6 oz (52.7 kg)     Lab Results  Component Value Date   WBC 5.3 07/09/2017   HGB 11.9 (L) 07/09/2017   HCT 36.1 07/09/2017   PLT 265.0 07/09/2017   GLUCOSE 84 07/09/2017   CHOL 203 (H) 04/26/2017   TRIG 88 04/26/2017   HDL 54 04/26/2017   LDLDIRECT 128.0 05/25/2013   LDLCALC 131 (H) 04/26/2017   ALT 23 07/09/2017   AST 23 07/09/2017   NA 142 07/09/2017   K 3.9 07/09/2017   CL 105 07/09/2017   CREATININE 0.81 07/09/2017   BUN 20 07/09/2017   CO2 29 07/09/2017   TSH 2.38 07/09/2017   HGBA1C 5.7 (H) 04/25/2017    Dg Chest 2 View  Result Date: 04/27/2017 CLINICAL DATA:  Cough since last night. EXAM: CHEST  2 VIEW COMPARISON:  Chest CT 09/23/2010 FINDINGS: Lungs are adequately inflated without consolidation or effusion. Cardiomediastinal silhouette is within normal bones and soft tissues are unremarkable. IMPRESSION: No active cardiopulmonary disease. Electronically Signed   By: Marin Olp M.D.   On: 04/27/2017 18:09       Assessment & Plan:   Problem List Items Addressed This Visit    Essential hypertension    Amlodipine just changed to 2.5mg  q day.  Blood pressure ok.  Continue current medication regimen.  Follow.       Relevant Medications   atorvastatin (LIPITOR) 20 MG tablet   Other Relevant Orders   Basic metabolic panel   Gait disturbance, post-stroke    Working with therapy.       GERD (gastroesophageal  reflux disease)    Controlled on pepcid now.       Relevant Medications   Famotidine (PEPCID PO)   Hypercholesterolemia    On lipitor.  She decreased her lipitor to 20mg  q day.  Discussed importance and reasoning for high dose cholesterol medication.  She elects to remain on 20mg .  Check lipid panel and liver function tests.       Relevant Medications   atorvastatin (LIPITOR) 20 MG tablet   Other Relevant Orders   Hepatic function panel   Lipid panel   Hypothyroidism    On thyroid replacement.  Follow tsh.        Relevant Medications   levothyroxine (SYNTHROID, LEVOTHROID) 50 MCG tablet   Left spastic hemiparesis (HCC)    Recent CVA.  On aspirin and plavix.  Continue therapy.       Mild depression (HCC)    Off citalopram.  She stopped.  Desires not to restart.  Follow.        Rheumatoid arthritis involving both hands ([place])    Followed by Dr Jefm Bryant.  Just evaluated 07/15/17.  Recommended continue MTX.       Right-sided lacunar infarction [facility])    Resulting left arm/leg weakness.  Continue aspirin and plavix.  Continue PT.       Urinary frequency    Recent urinalysis - no infection.  Has appt with Dr Jacqlyn Larsen to discuss given persistent symptoms.         Other Visit Diagnoses    Anemia, unspecified type    -  Primary   Relevant Orders   CBC with Differential/Platelet   Ferritin   Vitamin B12       Einar Pheasant, MD

## 2017-08-02 NOTE — Progress Notes (Signed)
Pre-visit discussion using our clinic review tool. No additional management support is needed unless otherwise documented below in the visit note.  

## 2017-08-02 NOTE — Assessment & Plan Note (Signed)
Recent urinalysis - no infection.  Has appt with Dr Jacqlyn Larsen to discuss given persistent symptoms.

## 2017-08-02 NOTE — Assessment & Plan Note (Signed)
Controlled on pepcid now.

## 2017-08-02 NOTE — Assessment & Plan Note (Signed)
Amlodipine just changed to 2.5mg  q day.  Blood pressure ok.  Continue current medication regimen.  Follow.

## 2017-08-02 NOTE — Assessment & Plan Note (Signed)
On lipitor.  She decreased her lipitor to 20mg  q day.  Discussed importance and reasoning for high dose cholesterol medication.  She elects to remain on 20mg .  Check lipid panel and liver function tests.

## 2017-08-02 NOTE — Assessment & Plan Note (Signed)
On thyroid replacement.  Follow tsh.  

## 2017-08-02 NOTE — Assessment & Plan Note (Signed)
Followed by Dr Jefm Bryant.  Just evaluated 07/15/17.  Recommended continue MTX.

## 2017-08-02 NOTE — Assessment & Plan Note (Signed)
Off citalopram.  She stopped.  Desires not to restart.  Follow.

## 2017-08-03 ENCOUNTER — Ambulatory Visit: Payer: PPO | Admitting: Occupational Therapy

## 2017-08-03 ENCOUNTER — Encounter: Payer: Self-pay | Admitting: Physical Therapy

## 2017-08-03 ENCOUNTER — Ambulatory Visit (INDEPENDENT_AMBULATORY_CARE_PROVIDER_SITE_OTHER): Payer: PPO | Admitting: Diagnostic Neuroimaging

## 2017-08-03 ENCOUNTER — Ambulatory Visit: Payer: PPO | Admitting: Physical Therapy

## 2017-08-03 ENCOUNTER — Encounter: Payer: Self-pay | Admitting: Diagnostic Neuroimaging

## 2017-08-03 VITALS — BP 120/73 | HR 68 | Ht 64.0 in | Wt 113.4 lb

## 2017-08-03 DIAGNOSIS — M6281 Muscle weakness (generalized): Secondary | ICD-10-CM

## 2017-08-03 DIAGNOSIS — R2689 Other abnormalities of gait and mobility: Secondary | ICD-10-CM

## 2017-08-03 DIAGNOSIS — R278 Other lack of coordination: Secondary | ICD-10-CM

## 2017-08-03 DIAGNOSIS — I639 Cerebral infarction, unspecified: Secondary | ICD-10-CM

## 2017-08-03 DIAGNOSIS — I69352 Hemiplegia and hemiparesis following cerebral infarction affecting left dominant side: Secondary | ICD-10-CM

## 2017-08-03 DIAGNOSIS — I63311 Cerebral infarction due to thrombosis of right middle cerebral artery: Secondary | ICD-10-CM

## 2017-08-03 DIAGNOSIS — R002 Palpitations: Secondary | ICD-10-CM | POA: Diagnosis not present

## 2017-08-03 NOTE — Patient Instructions (Signed)
Thank you for coming to see [place] at [facility]. I hope we have been able to provide you high quality care today.  You may receive a patient satisfaction survey over the next few weeks. We would appreciate your feedback and comments so that we may continue to improve ourselves and the health of our patients.   RIGHT BRAIN STROKE - STOP ASPIRIN - CONTINUE PLAVIX 75MG DAILY - continue atorvastatin - continue amlodipine  PALPITATIONS - setup 30 day cardiac monitor; if negative then setup implanted loop recorder  ~~~~~~~~~~~~~~~~~~~~~~~~~~~~~~~~~~~~~~~~~~~~~~~~~~~~~~~~~~~~~~~~~  DR. Govanni Plemons'S GUIDE TO HAPPY AND HEALTHY LIVING These are some of my general health and wellness recommendations. Some of them may apply to you better than others. Please use common sense as you try these suggestions and feel free to ask me any questions.   ACTIVITY/FITNESS Mental, social, emotional and physical stimulation are very important for brain and body health. Try learning a new activity (arts, music, language, sports, games).  Keep moving your body to the best of your abilities. You can do this at home, inside or outside, the park, community center, gym or anywhere you like. Consider a physical therapist or personal trainer to get started. Consider the app Sworkit. Fitness trackers such as smart-watches, smart-phones or Fitbits can help as well.   NUTRITION Eat more plants: colorful vegetables, nuts, seeds and berries.  Eat less sugar, salt, preservatives and processed foods.  Avoid toxins such as cigarettes and alcohol.  Drink water when you are thirsty. Warm water with a slice of lemon is an excellent morning drink to start the day.  Consider these websites for more information The Nutrition Source (https://www.henry-hernandez.biz/) Precision Nutrition (WindowBlog.ch)   RELAXATION Consider practicing mindfulness meditation or other  relaxation techniques such as deep breathing, prayer, yoga, tai chi, massage. See website mindful.org or the apps Headspace or Calm to help get started.   SLEEP Try to get at least 7-8+ hours sleep per day. Regular exercise and reduced caffeine will help you sleep better. Practice good sleep hygeine techniques. See website sleep.org for more information.   PLANNING Prepare estate planning, living will, healthcare POA documents. Sometimes this is best planned with the help of an attorney. Theconversationproject.org and agingwithdignity.org are excellent resources.

## 2017-08-03 NOTE — Therapy (Signed)
[place] MAIN [facility] SERVICES [address] [place], [place], [address] Phone: [phone]   Fax:  [phone]  Physical Therapy Treatment  Patient Details  Name: Donna Rhodes MRN: [id number] Date of Birth: 1946/04/05 Referring Provider: Alysia Penna  Encounter Date: 08/03/2017      PT End of Session - 08/03/17 1442    Visit Number 4   Number of Visits 17   Date for PT Re-Evaluation 09/15/17   Authorization Type gcode 4   Authorization Time Period 10   PT Start Time 1306   PT Stop Time 1345   PT Time Calculation (min) 39 min   Equipment Utilized During Treatment Gait belt   Activity Tolerance Patient tolerated treatment well;No increased pain   Behavior During Therapy [facility] for tasks assessed/performed;Flat affect      Past Medical History:  Diagnosis Date  . Atrophic vaginitis   . Dysplasia of vagina    vin 1 - vulva and vaginal cuff  . GERD (gastroesophageal reflux disease)   . HCV infection   . HSV (herpes simplex virus) infection   . Hypercholesterolemia   . Hypertension   . Hypothyroidism   . Lung nodule    stable  . Menopausal state   . Osteoarthritis    hand involvement, cervical disc disease  . Osteoporosis   . Pelvic pain in female   . Rheumatoid arthritis ([place])   . 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 Thyroid nodule   . Vaginal Pap smear, abnormal    lgsil    Past Surgical History:  Procedure Laterality Date  . ADENOIDECTOMY    . APPENDECTOMY  1965  . [place]   left  . CHOLECYSTECTOMY    . [place]  . laser vein surgery    . MANDIBLE RECONSTRUCTION  1992  . TOENAIL EXCISION  1998   several  . [place]  . VAGINAL HYSTERECTOMY  1983   als0- anterior/posterior colporrhaphy  . VAGINAL WOUND CLOSURE / REPAIR  1999, 2000  . VAGINAL WOUND CLOSURE / REPAIR      There were no vitals filed for this visit.      Subjective Assessment - 08/03/17 1439     Subjective pt reports doing well today; Pt is having trouble with L shoulder; Pt reports still having trouble sleeping at night; Pt went to doctor and was able to decrease and stop taking some medication;    Patient is accompained by: Family member   Pertinent History Pt is a 71 y.o female that presents to therapy s/p CVA; On April 24, 2017 pt suffered an acute infarct in right basal ganglia. She was in hospital 3 days; after pt was transferred to [facility] where she did inpatient therapy; Pt then completed home PT until about 2 weeks ago; Pt has pain in shoulder when moving it with R UE and reports that deltoid is paralyzed; Pt fell out of bed one day and fell on L UE; Pt reported doctor put a shot in L shoulder and will receive 3 more shots in shoulder; Pt reports no sensation or numbness and tingling with LE just some sensation impaired in L hand; Pt has an air cast for L LE and splint for wrist but does not use because difficulty donning;  Pt has had trouble sleeping at night with waking and going to the bathroom but denies any pain;  Only has pain in shoulder when moving described as throbbing;  Pt reports not using her cane in the last month and states her balance is fine without it;    Limitations Lifting;Standing;Walking   How long can you sit comfortably? hours    How long can you stand comfortably? 30 min    How long can you walk comfortably? better in the morning because more energy;    Diagnostic tests x rays in shoulder and hand; all clear; doctor stated "inferior pseudo-subluxation from paralysis of the deltoid. she has advanced left hand arthritis"   Patient Stated Goals walking better without the cane;    Currently in Pain? Yes   Pain Score 2    Pain Location Shoulder   Pain Orientation Left   Pain Descriptors / Indicators Sore   Pain Type Chronic pain   Pain Onset More than a month ago   Pain Frequency Intermittent   Aggravating Factors  end range, shoulder ROM   Pain Relieving  Factors rest, cortisone injections, medication   Effect of Pain on Daily Activities decreased UE movement tolerance;       PT TREATMENT;   Standing in //bars with 2# ankle weights;  Hip abduction x15  Hip flexion marches x 15  Hip extension x 15  Knee flexion x 15  All exercise on LLE to facilitate better strengthening; Required min VCs for correct positioning and LE movement for better strengthening;  Lateral step ups; 2 x 10ea with RUE support;  Forward step up and over on 5" box; alt steps 2 x 10 ea; Pt cued on foot placement and verbal cues for foot clearance by increasing ankle dorsiflexion; Pt's weakness in hip abductors caused difficulty with side stepping to the L LE;    Airex pad tandem; R/L foot forward 2 x 1 min with  trunk rotations R/L x 10 ea; increase in sway when turning and looking to the left; pt had a few episodes of LOB but was able to correct posture; Pt cued to stand erect and look forward;  PT placed walk aide on L LE for gait placed on tilt mode and level 4 intensity;  Gait training; on treadmill x 8 min at .13mph  Pt ambulated on treadmill to work on increasing dorsiflexion with walk aide and decrease circumduction on L hip; Pt was cued to decrease gait speed by lengthening steps; Pt had a decreased stance time on the L and decreased step length on the R; Pt also cued to land on heel of L LE and land with knee extended to improve accuracy of the walk aide activation;Pt was able to correct positioning and speed with the verbal cues given; PT held L flaccid UE in coordination with stepping to support it and decrease pain;  Patient tends to ambulate with flexed position in knee in stance due to weakness and poor proprioception;   Pt tolerated treatment well; with no increase in pain                             PT Education - 08/03/17 1441    Education provided Yes   Education Details strengthening, gait, balance   Person(s) Educated  Patient   Methods Explanation;Verbal cues   Comprehension Verbalized understanding;Returned demonstration;Verbal cues required             PT Long Term Goals - 07/21/17 1245      PT LONG TERM GOAL #1   Title Pt will become independent with HEP in order to  improve ADL independence;   Time 8   Period Weeks   Status New   Target Date 09/15/17     PT LONG TERM GOAL #2   Title Pt will decrease 5 times sit to stand to less than 15 sec with no UE use to decrease risk of fall;   Baseline 18 sec   Time 8   Period Weeks   Status New   Target Date 09/15/17     PT LONG TERM GOAL #3   Title Pt will improve gait speed in 10MWT to .8-1.39m/s in order to be considered a comunity ambulator;   Baseline .48m/s   Time 8   Period Weeks   Status New   Target Date 09/15/17     PT LONG TERM GOAL #4   Title Pt will improve 6MWT to 800 feet in order to improve activity tolerance;   Baseline 565 feet   Time 8   Period Weeks   Status New   Target Date 09/15/17     PT LONG TERM GOAL #5   Title Pt will increase strength in L LE to 4+/5 for all muscles in order to improve mobility and function with daily activities;    Time 8   Period Weeks   Status New   Target Date 09/15/17               Plan - 08/03/17 1442    Clinical Impression Statement Pt today was instructed in advanced strengthening LE; Pt was able to do strengthening with ankle weights, and reports good muscle fatigue after exercises; Pt was also challenged with balance in tandem stance; Pt had a few episodes of LOB but was able to recover with UE support; Pt trained in gait with walk aide on the treadmill which seems to benefit her with increasing dorsiflexion; Pt required multiple cues during gait training for increasing steep length to prevent from shuffling gait; Pt also would benefit from more quad strengthening and TKE in order to increase quad strength for gait and hold knee in extension; Pt would continue to benefit from  PT in order to improve strength, balance, and gait safety;    Rehab Potential Fair   Clinical Impairments Affecting Rehab Potential neg: age, lives alone, comorbidities, pos: wants to be more independent; already high functioning    PT Frequency 2x / week   PT Duration 8 weeks   PT Treatment/Interventions ADLs/Self Care Home Management;Aquatic Therapy;Moist Heat;Cryotherapy;DME Instruction;Gait training;Stair training;Functional mobility training;Therapeutic activities;Therapeutic exercise;Balance training;Neuromuscular re-education;Manual techniques;Passive range of motion;Energy conservation;Electrical Stimulation;Patient/family education;Orthotic Fit/Training   PT Next Visit Plan berg; stairs; strengthening   PT Home Exercise Plan see pt instructions   Consulted and Agree with Plan of Care Patient      Patient will benefit from skilled therapeutic intervention in order to improve the following deficits and impairments:  Abnormal gait, Impaired sensation, Decreased mobility, Decreased activity tolerance, Decreased endurance, Decreased range of motion, Decreased strength, Impaired UE functional use, Decreased balance, Decreased safety awareness, Difficulty walking, Pain  Visit Diagnosis: Hemiplegia and hemiparesis following cerebral infarction affecting left dominant side (HCC)  Muscle weakness (generalized)  Other abnormalities of gait and mobility     Problem List Patient Active Problem List   Diagnosis Date Noted  . Urinary frequency 07/11/2017  . Gait disturbance, post-stroke 05/31/2017  . Left spastic hemiparesis ([place]) 05/31/2017  . Adjustment disorder with mixed anxiety and depressed mood   . Hypokalemia   . HCAP (healthcare-associated pneumonia)   . Cough  with fever   . 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 FUO (fever of unknown origin)   . Oral thrush   . Benign essential HTN   . Rheumatoid arthritis involving both hands ([place]) 04/28/2017  . Right-sided  lacunar infarction ([place]) 04/27/2017  . CVA (cerebral vascular accident) ([place]) 04/24/2017  . LGSIL Pap smear of vagina 12/15/2016  . Anal skin tag 02/02/2016  . Inflammatory arthritis 01/19/2016  . Right knee pain 01/19/2016  . Genital herpes 10/20/2015  . GERD (gastroesophageal reflux disease) 10/20/2015  . Status post vaginal hysterectomy 10/20/2015  . Health care maintenance 05/30/2015  . LLQ pain 03/31/2014  . Menopausal symptoms 03/31/2014  . Hypercholesterolemia 12/31/2012  . Hypothyroidism 12/29/2012  . Osteoarthritis 12/29/2012  . PULMONARY NODULE 01/02/2008  . Mild depression ([place]) 12/30/2007  . Essential hypertension 12/30/2007   Doreene Nest, SPT This entire session was performed under direct supervision and direction of a licensed therapist/therapist assistant . I have personally read, edited and approve of the note as written.  Trotter,Margaret PT, DPT 08/03/2017, 5:06 PM  [place] MAIN [facility] SERVICES [address] [place], [place], [address] Phone: [phone]   Fax:  [phone]  Name: Donna Rhodes MRN: [id number] Date of Birth: 12/28/1946

## 2017-08-03 NOTE — Progress Notes (Signed)
[facility]  PATIENT: Donna Rhodes DOB: 07/09/1946  REFERRING CLINICIAN: A Kirsteins / C Scott HISTORY FROM: patient  REASON FOR VISIT: new consult    HISTORICAL  CHIEF COMPLAINT:  Chief Complaint  Patient presents with  . Stroke    rm 7, New Pt, hospital FU, dgtr- Manuela Schwartz, doing outpt therapy now    HISTORY OF PRESENT ILLNESS:   71 year old left-handed female here for evaluation of stroke. Patient presented to emergency room on 04/24/17 for left-sided weakness and slurred speech. Initial workup with CT scan and MRI of the brain were unremarkable. However upon discharge patient felt that her left hand was not working properly. Patient was offered opportunity to stay in the emergency room and have further evaluation. However patient declined and went back home. Unfortunately several hours later is her symptoms continue to worsen. She returned to the emergency room that evening and had another MRI scan of the brain which now showed a right range subcortical ischemic infarction. Patient was admitted for workup. She was discharged to inpatient rehabilitation.  Since that time symptoms are improving. She still has numbness, weakness, slurred speech, change in appetite, fatigue, pain. She is working with primary care, return rehabilitation clinic, physical therapy.    REVIEW OF SYSTEMS: Full 14 system review of systems performed and negative with exception of: As per history of present illness.  ALLERGIES: Allergies  Allergen Reactions  . Amoxicillin Other (See Comments)    Irritated tongue  . Avelox [Moxifloxacin Hcl In Nacl]   . Azithromycin Other (See Comments)    Caused mouth and tongue to be sore  . Codeine   . [place] Weight Management [Nutritional Supplements]     Mouth soreness  . Food     Walnuts --mouth breaks out  . Moxifloxacin Other (See Comments)  . Naproxen   . Cefepime Rash    HOME MEDICATIONS: Outpatient Medications Prior to Visit    Medication Sig Dispense Refill  . amLODipine (NORVASC) 5 MG tablet TAKE 1 TABLET BY MOUTH ONCE DAILY (Patient taking differently: TAKE 1/2 TABLET BY MOUTH ONCE DAILY) 30 tablet 2  . aspirin 81 MG EC tablet Take 1 tablet (81 mg total) by mouth daily. 100 tablet 0  . atorvastatin (LIPITOR) 20 MG tablet Take 1 tablet (20 mg total) by mouth daily. 90 tablet 1  . calcium-vitamin D (OSCAL WITH D) 250-125 MG-UNIT tablet Take 1 tablet by mouth daily.    . clopidogrel (PLAVIX) 75 MG tablet Take 1 tablet (75 mg total) by mouth daily. 90 tablet 1  . Famotidine (PEPCID PO) Take by mouth 2 (two) times daily.    . folic acid (FOLVITE) 1 MG tablet Take 1 mg by mouth daily.    Marland Kitchen levothyroxine (SYNTHROID, LEVOTHROID) 50 MCG tablet Take 1 tablet (50 mcg total) by mouth daily before breakfast. 90 tablet 1  . methotrexate 50 MG/2ML injection Inject 0.38mL under the skin once a week.     No facility-administered medications prior to visit.     PAST MEDICAL HISTORY: Past Medical History:  Diagnosis Date  . Atrophic vaginitis   . Dysplasia of vagina    vin 1 - vulva and vaginal cuff  . GERD (gastroesophageal reflux disease)   . HCV infection   . HSV (herpes simplex virus) infection   . Hypercholesterolemia   . Hypertension   . Hypothyroidism   . Lung nodule    stable  . Menopausal state   . Osteoarthritis    hand involvement,  cervical disc disease  . Osteoporosis   . Pelvic pain in female   . Rheumatoid arthritis ([place])   . Stroke (cerebrum) ([place]) 04/24/2017  . Thyroid nodule   . Vaginal Pap smear, abnormal    lgsil    PAST SURGICAL HISTORY: Past Surgical History:  Procedure Laterality Date  . ADENOIDECTOMY    . APPENDECTOMY  1965  . [place]   left  . CHOLECYSTECTOMY    . [place]  . laser vein surgery    . MANDIBLE RECONSTRUCTION  1992  . TOENAIL EXCISION  1998   several  . [place]  . VAGINAL HYSTERECTOMY  1983   als0- anterior/posterior  colporrhaphy  . VAGINAL WOUND CLOSURE / REPAIR  1999, 2000  . VAGINAL WOUND CLOSURE / REPAIR      FAMILY HISTORY: Family History  Problem Relation Age of Onset  . Hypertension Father   . Dementia Father   . Osteoarthritis Mother   . Arthritis/Rheumatoid Sister   . Breast cancer Unknown        paternal side  . Breast cancer Paternal Aunt   . Breast cancer Cousin        maternal  . Uterine cancer Maternal Grandmother   . Ovarian cancer Neg Hx   . Heart disease Neg Hx   . Diabetes Neg Hx   . Colon cancer Neg Hx     SOCIAL HISTORY:  Social History   Social History  . Marital status: Divorced    Spouse name: N/A  . Number of children: 3  . Years of education: 12   Occupational History  .      retired   Social History Main Topics  . Smoking status: Former Smoker    Quit date: 12/29/1979  . Smokeless tobacco: Never Used  . Alcohol use No  . Drug use: No  . Sexual activity: Not Currently    Birth control/ protection: Surgical   Other Topics Concern  . Not on file   Social History Narrative   Lives alone   Caffeine- 1 cup coffee     PHYSICAL EXAM  GENERAL EXAM/CONSTITUTIONAL: Vitals:  Vitals:   08/03/17 0854  BP: 120/73  Pulse: 68  Weight: 113 lb 6.4 oz (51.4 kg)  Height: 5\' 4"  (1.626 m)     Body mass index is 19.47 kg/m.  Visual Acuity Screening   Right eye Left eye Both eyes  Without correction:     With correction:  20/100   Comments: 08/03/17 cataract in right eye, unable to see any letters    Patient is in no distress; well developed, nourished and groomed; neck is supple  CARDIOVASCULAR:  Examination of carotid arteries is normal; no carotid bruits  Regular rate and rhythm, no murmurs  Examination of peripheral vascular system by observation and palpation is normal  EYES:  Ophthalmoscopic exam of optic discs and posterior segments is normal; no papilledema or hemorrhages  MUSCULOSKELETAL:  Gait, strength, tone, movements noted in  Neurologic exam below  NEUROLOGIC: MENTAL STATUS:  No flowsheet data found.  awake, alert, oriented to person, place and time  recent and remote memory intact  normal attention and concentration  language fluent, comprehension intact, naming intact,   fund of knowledge appropriate  CRANIAL NERVE:   2nd - no papilledema on fundoscopic exam  2nd, 3rd, 4th, 6th - pupils equal and reactive to light, visual fields full to confrontation, extraocular muscles intact, no nystagmus  5th -  facial sensation symmetric  7th - facial strength symmetric  8th - hearing intact  9th - palate elevates symmetrically, uvula midline  11th - shoulder shrug symmetric  12th - tongue protrusion midline  MOTOR:   normal bulk, full strength in the RUE, RLE  LUE 2-3 PROX; 1-2 DISTAL; INCR TONE DISTALLY IN LUE  LLE 4+  SENSORY:   normal and symmetric to light touch, temperature, vibration  SLIGHT DECR IN LEFT HAND FINGERTIPS TO PP  COORDINATION:   finger-nose-finger, fine finger movements --> SLOW ON LEFT DUE TO WEAKNESS  SLOWER FOOT TAP ON LEFT  REFLEXES:   deep tendon reflexes present --> SLIGHTLY BRISK IN LEFT ARM  GAIT/STATION:   LEFT HEMIPARETIC GAIT; UNSTEADY; ALMOST FALLS WHEN STANDING UP    DIAGNOSTIC DATA (LABS, IMAGING, TESTING) - I reviewed patient records, labs, notes, testing and imaging myself where available.  Lab Results  Component Value Date   WBC 5.3 07/09/2017   HGB 11.9 (L) 07/09/2017   HCT 36.1 07/09/2017   MCV 95.1 07/09/2017   PLT 265.0 07/09/2017      Component Value Date/Time   NA 142 07/09/2017 0934   K 3.9 07/09/2017 0934   CL 105 07/09/2017 0934   CO2 29 07/09/2017 0934   GLUCOSE 84 07/09/2017 0934   BUN 20 07/09/2017 0934   CREATININE 0.81 07/09/2017 0934   CALCIUM 9.6 07/09/2017 0934   PROT 6.9 07/09/2017 0934   ALBUMIN 4.2 07/09/2017 0934   AST 23 07/09/2017 0934   ALT 23 07/09/2017 0934   ALKPHOS 68 07/09/2017 0934   BILITOT  0.5 07/09/2017 0934   GFRNONAA >60 05/18/2017 0442   GFRAA >60 05/18/2017 0442   Lab Results  Component Value Date   CHOL 203 (H) 04/26/2017   HDL 54 04/26/2017   LDLCALC 131 (H) 04/26/2017   LDLDIRECT 128.0 05/25/2013   TRIG 88 04/26/2017   CHOLHDL 3.8 04/26/2017   Lab Results  Component Value Date   HGBA1C 5.7 (H) 04/25/2017   Lab Results  Component Value Date   VITAMINB12 436 09/11/2014   Lab Results  Component Value Date   TSH 2.38 07/09/2017    04/24/17 MRI brain / MRA head (8:31am) [I reviewed images myself and agree with interpretation. -VRP]  - No acute intracranial findings. Mild subcortical and periventricular T2 and FLAIR hyperintensities, likely chronic microvascular ischemic change. - Cervical spondylosis with central protrusion at C4-5, unknown degree of canal stenosis.  04/24/17 MRI brain / MRA head (5:09pm) [I reviewed images myself and agree with interpretation. Stroke is 2cm in size. -VRP]  - Acute infarction in the right basal ganglia/ posterior limb internal capsule. No evidence of hemorrhage or mass effect. - Negative intracranial MR angiography of the large and medium size vessels. No occlusion or correctable proximal stenosis. Some atherosclerotic irregularity in the carotid siphon regions without stenosis. Anatomic variations of hypoplastic A1 segment on the left, azygos anterior cerebral artery and fetal origin left PCA.  04/24/17 MRA neck - Normal MR angiography of the neck vessels.  04/26/17 TTE - Left ventricle: Wall thickness was increased in a pattern of mild   LVH. Systolic function was normal. The estimated ejection   fraction was in the range of 50% to 55%. Doppler parameters are   consistent with abnormal left ventricular relaxation (grade 1   diastolic dysfunction). - Aortic valve: Valve area (Vmax): 2.07 cm^2. - Mitral valve: Valve area by pressure half-time: 2.08 cm^2.  04/28/17 BLE u/s - No evidence of deep vein  thrombosis involving the  visualized veins of the right lower extremity. - No evidence of deep vein thrombosis involving the visualized veins of the left lower extremity. - No evidence of Baker&'s cyst on the right or left.     ASSESSMENT AND PLAN  71 y.o. year old female here with right brain subcortical ischemic infarction, with resultant left-sided weakness. She also had palpitations prior to onset of symptoms and another family member states that patient may have had atrial fibrillation in the past.  Dx:  1. Right basal ganglia embolic stroke ([place])   2. Cerebrovascular accident (CVA) due to thrombosis of right middle cerebral artery (H[facility])      PLAN:  RIGHT BRAIN SUBCORTICAL STROKE (2cm stroke; larger than typical lacunar infarct; need to complete cardio-embolic workup) - patient completed aspirin + plavix x 3 months; now reduce to plavix alone - continue atorvastatin - continue amlodipine  PALPITATIONS (? Atrial fibrillation per family account) - setup 30 day cardiac monitor; if negative then setup implanted loop recorder; patient requesting a different cardiologist, so I will set up consult  Orders Placed This Encounter  Procedures  . Ambulatory referral to Cardiology   Return in about 4 months (around 12/03/2017).    Penni Bombard, MD 0/12/7492, 4:96 AM Certified in Neurology, Neurophysiology and Neuroimaging  [facility] [address] [place], [place] [address] [phone]

## 2017-08-03 NOTE — Therapy (Signed)
[place] MAIN [facility] SERVICES [address] [place], [place], [address] Phone: [phone]   Fax:  [phone]  Occupational Therapy Treatment  Patient Details  Name: Donna Rhodes MRN: [id number] Date of Birth: 09/07/1946 Referring Provider: Dr. Letta Pate  Encounter Date: 08/03/2017      OT End of Session - 08/03/17 1452    Visit Number 4   Number of Visits 24   Date for OT Re-Evaluation 10/13/17   Authorization Type Medicare G Code 4 of 10   OT Start Time 1350   OT Stop Time 1430   OT Time Calculation (min) 40 min   Activity Tolerance Patient tolerated treatment well   Behavior During Therapy [facility] for tasks assessed/performed;Flat affect      Past Medical History:  Diagnosis Date  . Atrophic vaginitis   . Dysplasia of vagina    vin 1 - vulva and vaginal cuff  . GERD (gastroesophageal reflux disease)   . HCV infection   . HSV (herpes simplex virus) infection   . Hypercholesterolemia   . Hypertension   . Hypothyroidism   . Lung nodule    stable  . Menopausal state   . Osteoarthritis    hand involvement, cervical disc disease  . Osteoporosis   . Pelvic pain in female   . Rheumatoid arthritis ([place])   . Stroke (cerebrum) ([place]) 04/24/2017  . Thyroid nodule   . Vaginal Pap smear, abnormal    lgsil    Past Surgical History:  Procedure Laterality Date  . ADENOIDECTOMY    . APPENDECTOMY  1965  . [place]   left  . CHOLECYSTECTOMY    . [place]  . laser vein surgery    . MANDIBLE RECONSTRUCTION  1992  . TOENAIL EXCISION  1998   several  . [place]  . VAGINAL HYSTERECTOMY  1983   als0- anterior/posterior colporrhaphy  . VAGINAL WOUND CLOSURE / REPAIR  1999, 2000  . VAGINAL WOUND CLOSURE / REPAIR      There were no vitals filed for this visit.      Subjective Assessment - 08/03/17 1450    Subjective  Pt. reports she worked out in the yard this weekend.   Patient is accompained  by: Family member   Pertinent History Pt. is a 71 y.o. female who had a CVA on 04/24/2017. Pt. went to the ER when she started not feeling well, and having heart palpitations. Pt. reports having work up initially, however was sent home. Pt. reports later in the day she was unable to write with her left hand. Pt. returned to the ER, and was admitted with workup for a CVA. Pt. was admitted to [facility], and stayed for approximately 3 weeks. Pt. returned home alone, and  received home health services for the past 2 months. Pt. now presents for Outpatient OT services. Pt. resides home alone, and has support children who stop by daily, assist with shopping, transportation, and  ADL/IADL tasks as needed.    Limitations Pt. is unable to use her dominant RUE during functional tasks.   Currently in Pain? Yes      OT TREATMENT    Therapeutic Exercise:  Pt. worked on PROM in all joint ranges of the LUE, and hand. Pt. tolerated moist heat to the left shoulder ROM to the wrist, and digits. Heat was utilized prior to shoulder ROM secondary to pain. Pt. tolerated the moist heat well, and  was able to tolerate PROM to the shoulder better. Pt. worked on AROM of left elbow flexion, and extension, and attempted hand to face patterns in preparation for self-feeding, and grooming.  Manual Therapy:  Pt. Tolerated scapular mobes in elevation, depression, abduction, and rotation to normalize tone, and prepare the LUE for ROM.                            OT Education - 08/03/17 1451    Education provided Yes   Education Details UE strength, and coordination skills.   Person(s) Educated Patient   Methods Explanation;Verbal cues   Comprehension Verbalized understanding;Returned demonstration;Verbal cues required             OT Long Term Goals - 07/21/17 1411      OT LONG TERM GOAL #1   Title Pt. will tolerate increased shoulder PROM by 20 degrees with pain improved to  under 3/10   Baseline Flexion: 91, Abduction: 67   Time 12   Period Weeks   Status New   Target Date 10/13/17     OT LONG TERM GOAL #2   Title Pt. will formulate a full fist to be able to stabilize objects with her Left hand while using her right hand.    Baseline Digit flexion to [facility]: 2nd digit: 4cm, 3rd: 5.5, 4th: 6, 5th: 4.   Time 12   Period Weeks   Status New   Target Date 10/13/17     OT LONG TERM GOAL #3   Title Pt. will be able to wipe face independently with her Left UE.   Baseline Pt. is unable   Time 12   Period Weeks   Status New     OT LONG TERM GOAL #4   Title Pt. will demonstrate independence with splint application.   Baseline Pt. is unable to donn splint    Time 12   Period Weeks   Status New   Target Date 10/13/17     OT LONG TERM GOAL #5   Title Pt. will initiate active grasp release with thumb to 2nd digit 100% of the time to be able to hold a utensil in preparation for self-feeding.   Baseline Minimal thum movement.   Time 12   Period Weeks   Status New   Target Date 10/13/17     Long Term Additional Goals   Additional Long Term Goals Yes     OT LONG TERM GOAL #6   Title Pt. will initiate wrist extension in preparation for functional reaching.   Baseline No active wrist extension.   Time 12   Period Weeks   Status New   Target Date 10/13/17               Plan - 08/03/17 1453    Clinical Impression Statement Pt. reports she was out pulling weeds, and working in her yard. Pt. continues to present with limited ROM, and functional hand use. Pt. reports her arm, was hurting over the weekend. Pt. has a follow-up appointment with Dr. Letta Pate on Friday, 08/06/2017. Pt. conitnues to work on improving her LUE ROM, and hand function skills.   Occupational performance deficits (Please refer to evaluation for details): ADL's;IADL's   Rehab Potential Good   OT Frequency 2x / week   OT Duration 12 weeks   OT Treatment/Interventions Self-care/ADL  training;DME and/or AE instruction;Energy conservation;Therapeutic exercise;Neuromuscular education;Moist Heat;Therapeutic activities;Cognitive remediation/compensation;Therapeutic exercises;Patient/family education   Consulted and  Agree with Plan of Care Patient      Patient will benefit from skilled therapeutic intervention in order to improve the following deficits and impairments:  Decreased cognition, Difficulty walking, Decreased balance, Decreased strength, Decreased safety awareness, Decreased coordination, Impaired UE functional use, Impaired tone, Decreased range of motion, Decreased endurance, Decreased activity tolerance, Impaired perceived functional ability, Pain  Visit Diagnosis: Muscle weakness (generalized)  Other lack of coordination    Problem List Patient Active Problem List   Diagnosis Date Noted  . Urinary frequency 07/11/2017  . Gait disturbance, post-stroke 05/31/2017  . Left spastic hemiparesis ([place]) 05/31/2017  . Adjustment disorder with mixed anxiety and depressed mood   . Hypokalemia   . HCAP (healthcare-associated pneumonia)   . Cough with fever   . 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 FUO (fever of unknown origin)   . Oral thrush   . Benign essential HTN   . Rheumatoid arthritis involving both hands ([place]) 04/28/2017  . 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[place]) 04/27/2017  . CVA (cerebral vascular accident) ([place]) 04/24/2017  . LGSIL Pap smear of vagina 12/15/2016  . Anal skin tag 02/02/2016  . Inflammatory arthritis 01/19/2016  . Right knee pain 01/19/2016  . Genital herpes 10/20/2015  . GERD (gastroesophageal reflux disease) 10/20/2015  . Status post vaginal hysterectomy 10/20/2015  . Health care maintenance 05/30/2015  . LLQ pain 03/31/2014  . Menopausal symptoms 03/31/2014  . Hypercholesterolemia 12/31/2012  . Hypothyroidism 12/29/2012  . Osteoarthritis 12/29/2012  . PULMONARY NODULE 01/02/2008  . Mild depression ([place]) 12/30/2007  .  Essential hypertension 12/30/2007    Harrel Carina, MS, OTR/L 08/03/2017, 3:01 PM  [place] MAIN [facility] SERVICES [address] [place], [place], [address] Phone: [phone]   Fax:  [phone]  Name: Donna Rhodes MRN: [id number] Date of Birth: 06-04-1946

## 2017-08-05 ENCOUNTER — Encounter: Payer: Self-pay | Admitting: Physical Therapy

## 2017-08-05 ENCOUNTER — Ambulatory Visit: Payer: PPO

## 2017-08-05 ENCOUNTER — Ambulatory Visit: Payer: PPO | Admitting: Occupational Therapy

## 2017-08-05 DIAGNOSIS — M6281 Muscle weakness (generalized): Secondary | ICD-10-CM | POA: Diagnosis not present

## 2017-08-05 DIAGNOSIS — R278 Other lack of coordination: Secondary | ICD-10-CM

## 2017-08-05 DIAGNOSIS — I69352 Hemiplegia and hemiparesis following cerebral infarction affecting left dominant side: Secondary | ICD-10-CM

## 2017-08-05 DIAGNOSIS — R2689 Other abnormalities of gait and mobility: Secondary | ICD-10-CM

## 2017-08-05 NOTE — Therapy (Signed)
[place] MAIN [facility] SERVICES [address] [place], [place], [address] Phone: [phone]   Fax:  [phone]  Physical Therapy Treatment  Patient Details  Name: Donna Rhodes MRN: [id number] Date of Birth: Sep 23, 1946 Referring Provider: Alysia Penna  Encounter Date: 08/05/2017      PT End of Session - 08/05/17 1046    Visit Number 5   Number of Visits 17   Date for PT Re-Evaluation 09/15/17   Authorization Type gcode 5   Authorization Time Period 10   PT Start Time 1030   PT Stop Time 1115   PT Time Calculation (min) 45 min   Equipment Utilized During Treatment Gait belt   Activity Tolerance Patient tolerated treatment well;No increased pain   Behavior During Therapy [facility] for tasks assessed/performed;Flat affect      Past Medical History:  Diagnosis Date  . Atrophic vaginitis   . Dysplasia of vagina    vin 1 - vulva and vaginal cuff  . GERD (gastroesophageal reflux disease)   . HCV infection   . HSV (herpes simplex virus) infection   . Hypercholesterolemia   . Hypertension   . Hypothyroidism   . Lung nodule    stable  . Menopausal state   . Osteoarthritis    hand involvement, cervical disc disease  . Osteoporosis   . Pelvic pain in female   . Rheumatoid arthritis ([place])   . Stroke (cerebrum) ([place]) 04/24/2017  . Thyroid nodule   . Vaginal Pap smear, abnormal    lgsil    Past Surgical History:  Procedure Laterality Date  . ADENOIDECTOMY    . APPENDECTOMY  1965  . [place]   left  . CHOLECYSTECTOMY    . [place]  . laser vein surgery    . MANDIBLE RECONSTRUCTION  1992  . TOENAIL EXCISION  1998   several  . [place]  . VAGINAL HYSTERECTOMY  1983   als0- anterior/posterior colporrhaphy  . VAGINAL WOUND CLOSURE / REPAIR  1999, 2000  . VAGINAL WOUND CLOSURE / REPAIR      There were no vitals filed for this visit.      Subjective Assessment - 08/05/17 1031     Subjective Pt reports being tired and not sleeping; Pt states by the afternoon her L foot starts to catch when walking   Patient is accompained by: Family member   Pertinent History Pt is a 71 y.o female that presents to therapy s/p CVA; On April 24, 2017 pt suffered an acute infarct in right basal ganglia. She was in hospital 3 days; after pt was transferred to [facility] where she did inpatient therapy; Pt then completed home PT until about 2 weeks ago; Pt has pain in shoulder when moving it with R UE and reports that deltoid is paralyzed; Pt fell out of bed one day and fell on L UE; Pt reported doctor put a shot in L shoulder and will receive 3 more shots in shoulder; Pt reports no sensation or numbness and tingling with LE just some sensation impaired in L hand; Pt has an air cast for L LE and splint for wrist but does not use because difficulty donning;  Pt has had trouble sleeping at night with waking and going to the bathroom but denies any pain;  Only has pain in shoulder when moving described as throbbing; Pt reports not using her cane in the last month and states her balance is  fine without it;    Limitations Lifting;Standing;Walking   How long can you sit comfortably? hours    How long can you stand comfortably? 30 min    How long can you walk comfortably? better in the morning because more energy;    Diagnostic tests x rays in shoulder and hand; all clear; doctor stated "inferior pseudo-subluxation from paralysis of the deltoid. she has advanced left hand arthritis"   Patient Stated Goals walking better without the cane;    Currently in Pain? No/denies   Pain Onset --        PT TREATMENT;  BAPS board;  clockwise and counterclockwise motion; x 5 ea way;  Dorsiflexion and plantarflexion x 5 ea Eversion and inversion; Pt had difficulty controlling slow movement with the ankle in most directions;  Blue Tband ankle dorsiflexion 2 x 20;  L Single Squat to table with HHA  2 x 10; table  was elevated so pt squat was about 45 degrees; Pt was able to perform the exercise with min cuing for positioning;   LAQ; 3 x 10 with 2.5# ankle weight; cues for slow movement for better muscle activation  Standing TKE with ball behind knee 2 x 10 with 3 sec hold   Leg press: BLE plate 75# E93 reps Leg press, L LE only plate 45# 2 x 12 reps Pt required cues to improve knee positioning and to prevent hyperextension for better strengthening and control;   Step ups 5" box alt LE x 10 ea  Lateral step ups 5"box alt LE x 10 ea  Pt VCs to keep foot dorsiflex to not drag foot on step also to land on heel of foot in order to prevent ankle turning in; Pt L and R foot demonstrate similar stepping strategies; Pt educated on strengthening and how it will improve stair negotiation;   Pt tolerated treatment well                             PT Education - 08/05/17 1046    Education provided Yes   Education Details Strengthening LE; steps;    Person(s) Educated Patient   Methods Explanation;Verbal cues;Tactile cues   Comprehension Verbalized understanding;Returned demonstration;Verbal cues required             PT Long Term Goals - 07/21/17 1245      PT LONG TERM GOAL #1   Title Pt will become independent with HEP in order to improve ADL independence;   Time 8   Period Weeks   Status New   Target Date 09/15/17     PT LONG TERM GOAL #2   Title Pt will decrease 5 times sit to stand to less than 15 sec with no UE use to decrease risk of fall;   Baseline 18 sec   Time 8   Period Weeks   Status New   Target Date 09/15/17     PT LONG TERM GOAL #3   Title Pt will improve gait speed in 10MWT to .8-1.36m/s in order to be considered a comunity ambulator;   Baseline .74m/s   Time 8   Period Weeks   Status New   Target Date 09/15/17     PT LONG TERM GOAL #4   Title Pt will improve 6MWT to 800 feet in order to improve activity tolerance;   Baseline 565 feet   Time  8   Period Weeks   Status New  Target Date 09/15/17     PT LONG TERM GOAL #5   Title Pt will increase strength in L LE to 4+/5 for all muscles in order to improve mobility and function with daily activities;    Time 8   Period Weeks   Status New   Target Date 09/15/17               Plan - 08/05/17 1047    Clinical Impression Statement Pt was instructed in LE strengthening, focusing on L quad and ankle control; Pt reports concern about ankle when negotiating stairs, when assessing steps pt demonstrates similar pattern with R LE; Pt demonstrates weakness in quads leading to knee hyperextension; Pt performs exercises very quickly and requires cues for slow controlled movement for best muscle activation; Pt will continue to benefit from skilled PT in order to improve strength, balance, and gait safety;    Rehab Potential Fair   Clinical Impairments Affecting Rehab Potential neg: age, lives alone, comorbidities, pos: wants to be more independent; already high functioning    PT Frequency 2x / week   PT Duration 8 weeks   PT Treatment/Interventions ADLs/Self Care Home Management;Aquatic Therapy;Moist Heat;Cryotherapy;DME Instruction;Gait training;Stair training;Functional mobility training;Therapeutic activities;Therapeutic exercise;Balance training;Neuromuscular re-education;Manual techniques;Passive range of motion;Energy conservation;Electrical Stimulation;Patient/family education;Orthotic Fit/Training   PT Next Visit Plan berg; stairs; strengthening   PT Home Exercise Plan see pt instructions   Consulted and Agree with Plan of Care Patient      Patient will benefit from skilled therapeutic intervention in order to improve the following deficits and impairments:  Abnormal gait, Impaired sensation, Decreased mobility, Decreased activity tolerance, Decreased endurance, Decreased range of motion, Decreased strength, Impaired UE functional use, Decreased balance, Decreased safety  awareness, Difficulty walking, Pain  Visit Diagnosis: Muscle weakness (generalized)  Other lack of coordination  Hemiplegia and hemiparesis following cerebral infarction affecting left dominant side (HCC)  Other abnormalities of gait and mobility     Problem List Patient Active Problem List   Diagnosis Date Noted  . Urinary frequency 07/11/2017  . Gait disturbance, post-stroke 05/31/2017  . Left spastic hemiparesis ([place]) 05/31/2017  . Adjustment disorder with mixed anxiety and depressed mood   . Hypokalemia   . HCAP (healthcare-associated pneumonia)   . Cough with fever   . 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 FUO (fever of unknown origin)   . Oral thrush   . Benign essential HTN   . Rheumatoid arthritis involving both hands ([place]) 04/28/2017  . 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[place]) 04/27/2017  . CVA (cerebral vascular accident) ([place]) 04/24/2017  . LGSIL Pap smear of vagina 12/15/2016  . Anal skin tag 02/02/2016  . Inflammatory arthritis 01/19/2016  . Right knee pain 01/19/2016  . Genital herpes 10/20/2015  . GERD (gastroesophageal reflux disease) 10/20/2015  . Status post vaginal hysterectomy 10/20/2015  . Health care maintenance 05/30/2015  . LLQ pain 03/31/2014  . Menopausal symptoms 03/31/2014  . Hypercholesterolemia 12/31/2012  . Hypothyroidism 12/29/2012  . Osteoarthritis 12/29/2012  . PULMONARY NODULE 01/02/2008  . Mild depression ([place]) 12/30/2007  . Essential hypertension 12/30/2007   This entire session was performed under direct supervision and direction of a licensed therapist/therapist assistant . I have personally read, edited and approve of the note as written.   Arda Keadle SPT Phillips Grout PT, DPT   Huprich,Jason 08/05/2017, 2:26 PM  [place] MAIN [facility] SERVICES [address] [place], [place], [address] Phone: [phone]   Fax:  [phone]  Name: DOMINO HOLTEN MRN: [id number] Date of  Birth: [birth year]

## 2017-08-05 NOTE — Therapy (Addendum)
[place] MAIN [facility] SERVICES [address] [place], [place], [address] Phone: [phone]   Fax:  [phone]  Occupational Therapy Treatment  Patient Details  Name: Donna Rhodes MRN: [id number] Date of Birth: May 22, 1946 Referring Provider: Dr. Letta Pate  Encounter Date: 08/05/2017      OT End of Session - 08/05/17 1158    Visit Number 5   Number of Visits 24   Date for OT Re-Evaluation 10/13/17   Authorization Type Medicare G Code 5 of 10   OT Start Time [phone]   OT Stop Time 1015   OT Time Calculation (min) 23 min   Activity Tolerance Patient tolerated treatment well   Behavior During Therapy [facility] for tasks assessed/performed;Flat affect      Past Medical History:  Diagnosis Date  . Atrophic vaginitis   . Dysplasia of vagina    vin 1 - vulva and vaginal cuff  . GERD (gastroesophageal reflux disease)   . HCV infection   . HSV (herpes simplex virus) infection   . Hypercholesterolemia   . Hypertension   . Hypothyroidism   . Lung nodule    stable  . Menopausal state   . Osteoarthritis    hand involvement, cervical disc disease  . Osteoporosis   . Pelvic pain in female   . Rheumatoid arthritis ([place])   . Stroke (cerebrum) ([place]) 04/24/2017  . Thyroid nodule   . Vaginal Pap smear, abnormal    lgsil    Past Surgical History:  Procedure Laterality Date  . ADENOIDECTOMY    . APPENDECTOMY  1965  . [place]   left  . CHOLECYSTECTOMY    . [place]  . laser vein surgery    . MANDIBLE RECONSTRUCTION  1992  . TOENAIL EXCISION  1998   several  . [place]  . VAGINAL HYSTERECTOMY  1983   als0- anterior/posterior colporrhaphy  . VAGINAL WOUND CLOSURE / REPAIR  1999, 2000  . VAGINAL WOUND CLOSURE / REPAIR      There were no vitals filed for this visit.      Subjective Assessment - 08/05/17 1147    Subjective  Pt. has an MD appointment this week with Dr. Letta Pate.   Patient is  accompained by: Family member   Pertinent History Pt. is a 71 y.o. female who had a CVA on 04/24/2017. Pt. went to the ER when she started not feeling well, and having heart palpitations. Pt. reports having work up initially, however was sent home. Pt. reports later in the day she was unable to write with her left hand. Pt. returned to the ER, and was admitted with workup for a CVA. Pt. was admitted to [facility], and stayed for approximately 3 weeks. Pt. returned home alone, and  received home health services for the past 2 months. Pt. now presents for Outpatient OT services. Pt. resides home alone, and has support children who stop by daily, assist with shopping, transportation, and  ADL/IADL tasks as needed.    Currently in Pain? Yes   Pain Score 10-Worst pain ever   Pain Location Arm   Pain Orientation Left   Aggravating Factors  end ROM in abduction   Pain Relieving Factors Pt. reports putting horse lindamint on it, and taking tylenol.      OT TREATMENT    Therapeutic Exercise:  Pt. Tolerated PROM to all joint ranges in the LUE, however had 10/10 with shoulder abduction. Pt.  was able to tolerate left shoulder flexion with milder discomfort at the end range. Pt. Also has discomfort in the left digit MPs, and PIPs. Pt. education was provided about positioning of the LUE.  Manual Therapy:  Pt. Tolerated scapular mobes for elevation depression, rotation/abduction. Pt. Able to tolerate well , without discomfort.                           OT Education - 08/05/17 1150    Education provided Yes   Person(s) Educated Patient   Methods Explanation;Verbal cues;Tactile cues   Comprehension Verbalized understanding;Returned demonstration;Verbal cues required             OT Long Term Goals - 07/21/17 1411      OT LONG TERM GOAL #1   Title Pt. will tolerate increased shoulder PROM by 20 degrees with pain improved to under 3/10   Baseline Flexion: 91,  Abduction: 67   Time 12   Period Weeks   Status New   Target Date 10/13/17     OT LONG TERM GOAL #2   Title Pt. will formulate a full fist to be able to stabilize objects with her Left hand while using her right hand.    Baseline Digit flexion to [facility]: 2nd digit: 4cm, 3rd: 5.5, 4th: 6, 5th: 4.   Time 12   Period Weeks   Status New   Target Date 10/13/17     OT LONG TERM GOAL #3   Title Pt. will be able to wipe face independently with her Left UE.   Baseline Pt. is unable   Time 12   Period Weeks   Status New     OT LONG TERM GOAL #4   Title Pt. will demonstrate independence with splint application.   Baseline Pt. is unable to donn splint    Time 12   Period Weeks   Status New   Target Date 10/13/17     OT LONG TERM GOAL #5   Title Pt. will initiate active grasp release with thumb to 2nd digit 100% of the time to be able to hold a utensil in preparation for self-feeding.   Baseline Minimal thum movement.   Time 12   Period Weeks   Status New   Target Date 10/13/17     Long Term Additional Goals   Additional Long Term Goals Yes     OT LONG TERM GOAL #6   Title Pt. will initiate wrist extension in preparation for functional reaching.   Baseline No active wrist extension.   Time 12   Period Weeks   Status New   Target Date 10/13/17               Plan - 08/05/17 1200    Clinical Impression Statement Pt. was late to the session. Pt. reports she almost did not come because she did not sleep well the previous night, secondary to left arm pain.  Pt. has 10/10 pain in her upper arm with abduction. ROM is limited by pain. Pt.took tylenol this morning, and put rubbing horse lindament on it. Pt. continues to work on improving UE functioning to increase engagement in ADL, and IADL tasks.    Occupational performance deficits (Please refer to evaluation for details): ADL's;IADL's   Rehab Potential Good   OT Frequency 2x / week   OT Duration 12 weeks   OT  Treatment/Interventions Self-care/ADL training;DME and/or AE instruction;Energy conservation;Therapeutic exercise;Neuromuscular education;Moist Heat;Therapeutic activities;Cognitive remediation/compensation;Therapeutic  exercises;Patient/family education   Consulted and Agree with Plan of Care Patient      Patient will benefit from skilled therapeutic intervention in order to improve the following deficits and impairments:  Decreased cognition, Difficulty walking, Decreased balance, Decreased strength, Decreased safety awareness, Decreased coordination, Impaired UE functional use, Impaired tone, Decreased range of motion, Decreased endurance, Decreased activity tolerance, Impaired perceived functional ability, Pain  Visit Diagnosis: Muscle weakness (generalized)    Problem List Patient Active Problem List   Diagnosis Date Noted  . Urinary frequency 07/11/2017  . Gait disturbance, post-stroke 05/31/2017  . Left spastic hemiparesis ([place]) 05/31/2017  . Adjustment disorder with mixed anxiety and depressed mood   . Hypokalemia   . HCAP (healthcare-associated pneumonia)   . Cough with fever   . 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 FUO (fever of unknown origin)   . Oral thrush   . Benign essential HTN   . Rheumatoid arthritis involving both hands ([place]) 04/28/2017  . 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[place]) 04/27/2017  . CVA (cerebral vascular accident) ([place]) 04/24/2017  . LGSIL Pap smear of vagina 12/15/2016  . Anal skin tag 02/02/2016  . Inflammatory arthritis 01/19/2016  . Right knee pain 01/19/2016  . Genital herpes 10/20/2015  . GERD (gastroesophageal reflux disease) 10/20/2015  . Status post vaginal hysterectomy 10/20/2015  . Health care maintenance 05/30/2015  . LLQ pain 03/31/2014  . Menopausal symptoms 03/31/2014  . Hypercholesterolemia 12/31/2012  . Hypothyroidism 12/29/2012  . Osteoarthritis 12/29/2012  . PULMONARY NODULE 01/02/2008  . Mild depression ([place])  12/30/2007  . Essential hypertension 12/30/2007    Harrel Carina, MS, OTR/L 08/05/2017, 12:09 PM  [place] MAIN [facility] SERVICES [address] [place], [place], [address] Phone: [phone]   Fax:  [phone]  Name: Donna Rhodes MRN: [id number] Date of Birth: October 17, 1946

## 2017-08-06 ENCOUNTER — Encounter: Payer: Self-pay | Admitting: Physical Medicine & Rehabilitation

## 2017-08-06 ENCOUNTER — Encounter: Payer: PPO | Attending: Physical Medicine & Rehabilitation

## 2017-08-06 ENCOUNTER — Ambulatory Visit (HOSPITAL_BASED_OUTPATIENT_CLINIC_OR_DEPARTMENT_OTHER): Payer: PPO | Admitting: Physical Medicine & Rehabilitation

## 2017-08-06 VITALS — BP 130/75 | HR 71

## 2017-08-06 DIAGNOSIS — I10 Essential (primary) hypertension: Secondary | ICD-10-CM | POA: Diagnosis not present

## 2017-08-06 DIAGNOSIS — Z7982 Long term (current) use of aspirin: Secondary | ICD-10-CM | POA: Diagnosis not present

## 2017-08-06 DIAGNOSIS — M7502 Adhesive capsulitis of left shoulder: Secondary | ICD-10-CM | POA: Insufficient documentation

## 2017-08-06 DIAGNOSIS — I69398 Other sequelae of cerebral infarction: Secondary | ICD-10-CM

## 2017-08-06 DIAGNOSIS — I69354 Hemiplegia and hemiparesis following cerebral infarction affecting left non-dominant side: Secondary | ICD-10-CM | POA: Insufficient documentation

## 2017-08-06 DIAGNOSIS — K219 Gastro-esophageal reflux disease without esophagitis: Secondary | ICD-10-CM | POA: Diagnosis not present

## 2017-08-06 DIAGNOSIS — Z7902 Long term (current) use of antithrombotics/antiplatelets: Secondary | ICD-10-CM | POA: Diagnosis not present

## 2017-08-06 DIAGNOSIS — R531 Weakness: Secondary | ICD-10-CM | POA: Diagnosis not present

## 2017-08-06 DIAGNOSIS — G8114 Spastic hemiplegia affecting left nondominant side: Secondary | ICD-10-CM | POA: Insufficient documentation

## 2017-08-06 DIAGNOSIS — R269 Unspecified abnormalities of gait and mobility: Secondary | ICD-10-CM | POA: Insufficient documentation

## 2017-08-06 DIAGNOSIS — E785 Hyperlipidemia, unspecified: Secondary | ICD-10-CM | POA: Insufficient documentation

## 2017-08-06 DIAGNOSIS — M7989 Other specified soft tissue disorders: Secondary | ICD-10-CM | POA: Diagnosis not present

## 2017-08-06 DIAGNOSIS — M069 Rheumatoid arthritis, unspecified: Secondary | ICD-10-CM | POA: Insufficient documentation

## 2017-08-06 NOTE — Patient Instructions (Signed)
may have to repeat this shoulder injection in 1 month

## 2017-08-06 NOTE — Progress Notes (Signed)
Subjective:    Patient ID: Donna Rhodes, female    DOB: 1946-10-17, 71 y.o.   MRN: [id number]  HPI Patient continuing with outpatient PT and OT Dressing and bathing independently. She ambulates with a 4-point cane but sometimes ambulates without it. No falls since last visit. Continues to have left shoulder pain mainly when she moves it in a certain direction. Not driving. Pain Inventory Average Pain 10 Pain Right Now 0 My pain is sharp, stabbing and aching  In the last 24 hours, has pain interfered with the following? General activity 1 Relation with others 0 Enjoyment of life 8 What TIME of day is your pain at its worst? . Sleep (in general) Poor  Pain is worse with: some activites Pain improves with: therapy/exercise Relief from Meds: .  Mobility walk without assistance ability to climb steps?  yes do you drive?  no  Function retired  Neuro/Psych bladder control problems bowel control problems weakness numbness trouble walking anxiety loss of taste or smell  Prior Studies Any changes since last visit?  no  Physicians involved in your care Any changes since last visit?  no   Family History  Problem Relation Age of Onset  . Hypertension Father   . Dementia Father   . Osteoarthritis Mother   . Arthritis/Rheumatoid Sister   . Breast cancer Unknown        paternal side  . Breast cancer Paternal Aunt   . Breast cancer Cousin        maternal  . Uterine cancer Maternal Grandmother   . Ovarian cancer Neg Hx   . Heart disease Neg Hx   . Diabetes Neg Hx   . Colon cancer Neg Hx    Social History   Social History  . Marital status: Divorced    Spouse name: N/A  . Number of children: 3  . Years of education: 12   Occupational History  .      retired   Social History Main Topics  . Smoking status: Former Smoker    Quit date: 12/29/1979  . Smokeless tobacco: Never Used  . Alcohol use No  . Drug use: No  . Sexual activity: Not Currently   Birth control/ protection: Surgical   Other Topics Concern  . Not on file   Social History Narrative   Lives alone   Caffeine- 1 cup coffee   Past Surgical History:  Procedure Laterality Date  . ADENOIDECTOMY    . APPENDECTOMY  1965  . [place]   left  . CHOLECYSTECTOMY    . [place]  . laser vein surgery    . MANDIBLE RECONSTRUCTION  1992  . TOENAIL EXCISION  1998   several  . [place]  . VAGINAL HYSTERECTOMY  1983   als0- anterior/posterior colporrhaphy  . VAGINAL WOUND CLOSURE / REPAIR  1999, 2000  . VAGINAL WOUND CLOSURE / REPAIR     Past Medical History:  Diagnosis Date  . Atrophic vaginitis   . Dysplasia of vagina    vin 1 - vulva and vaginal cuff  . GERD (gastroesophageal reflux disease)   . HCV infection   . HSV (herpes simplex virus) infection   . Hypercholesterolemia   . Hypertension   . Hypothyroidism   . Lung nodule    stable  . Menopausal state   . Osteoarthritis    hand involvement, cervical disc disease  . Osteoporosis   . Pelvic pain in female   .  Rheumatoid arthritis ([place])   . Stroke (cerebrum) ([place]) 04/24/2017  . Thyroid nodule   . Vaginal Pap smear, abnormal    lgsil   LMP 12/29/1981   Opioid Risk Score:   Fall Risk Score:  `1  Depression screen PHQ 2/9  Depression screen [facility] 2/9 05/26/2017 10/23/2016 09/30/2015 05/31/2015 09/11/2014 05/29/2013 12/31/2012  Decreased Interest 0 0 0 0 0 0 0  Down, Depressed, Hopeless 0 0 0 0 0 0 0  PHQ - 2 Score 0 0 0 0 0 0 0     Review of Systems  Constitutional: Positive for appetite change, fever and unexpected weight change.  HENT: Negative.   Eyes: Negative.   Respiratory: Positive for shortness of breath.   Cardiovascular: Negative.   Gastrointestinal: Positive for constipation.  Endocrine: Negative.   Genitourinary: Negative.   Musculoskeletal: Positive for joint swelling.  Skin: Negative.   Allergic/Immunologic: Negative.   Neurological: Negative.     Hematological: Bruises/bleeds easily.  Psychiatric/Behavioral: Negative.   All other systems reviewed and are negative.      Objective:   Physical Exam  Constitutional: She appears well-developed and well-nourished. No distress.  HENT:  Head: Normocephalic and atraumatic.  Eyes: Pupils are equal, round, and reactive to light. Conjunctivae and EOM are normal.  Musculoskeletal:       Left shoulder: She exhibits decreased range of motion, deformity and decreased strength. She exhibits no tenderness and no effusion.  Neurological:  Motor strength is 2 minus at the left deltoid, bicep, tricep, finger flexors and extensors. 4. At the left knee extensor, 3 minus. Ankle dorsiflexor, 3 minus. Left hip flexor. Gait is with circumduction. No evidence of any instability. She does have a valgus deformity at the left knee.  Skin: She is not diaphoretic.  Nursing note and vitals reviewed.  Pain with left shoulder external rotation, abduction, forward flexion and adduction. There is evidence of inferior subluxation       Assessment & Plan:  1. Right basal ganglia and internal capsule infarct with left spastic hemiplegia.  2. Adhesive capsulitis, left shoulder, head, subacromial injection with some partial relief. Will to glenohumeral injection using ultrasound guidance. We'll need to repeat in one month. If no resolution  Shoulder injection Left  With ultrasound guidance)  Indication:Left Shoulder pain not relieved by medication management and other conservative care.  Informed consent was obtained after describing risks and benefits of the procedure with the patient, this includes bleeding, bruising, infection and medication side effects. The patient wishes to proceed and has given written consent. Patient was placed in a seated position. TheLeft shoulder was marked and prepped with betadine in the subacromial area. A 25-gauge 1-1/2 inch needle was inserted into the subacromial area. After  negative draw back for blood, a solution containing 1 mL of 6 mg per ML betamethasone and 4 mL of 1% lidocaine was injected. A band aid was applied. The patient tolerated the procedure well. Post procedure instructions were given.

## 2017-08-10 ENCOUNTER — Ambulatory Visit: Payer: PPO | Admitting: Occupational Therapy

## 2017-08-10 ENCOUNTER — Ambulatory Visit: Payer: PPO | Admitting: Physical Therapy

## 2017-08-10 ENCOUNTER — Encounter: Payer: Self-pay | Admitting: Occupational Therapy

## 2017-08-10 ENCOUNTER — Encounter: Payer: Self-pay | Admitting: Physical Therapy

## 2017-08-10 DIAGNOSIS — R278 Other lack of coordination: Secondary | ICD-10-CM

## 2017-08-10 DIAGNOSIS — M6281 Muscle weakness (generalized): Secondary | ICD-10-CM | POA: Diagnosis not present

## 2017-08-10 DIAGNOSIS — I69352 Hemiplegia and hemiparesis following cerebral infarction affecting left dominant side: Secondary | ICD-10-CM

## 2017-08-10 DIAGNOSIS — R2689 Other abnormalities of gait and mobility: Secondary | ICD-10-CM

## 2017-08-10 NOTE — Therapy (Signed)
[place] MAIN [facility] SERVICES [address] [place], [place], [address] Phone: [phone]   Fax:  [phone]  Physical Therapy Treatment  Patient Details  Name: Donna Rhodes MRN: [id number] Date of Birth: 1946-03-13 Referring Provider: Alysia Penna  Encounter Date: 08/10/2017      PT End of Session - 08/10/17 1644    Visit Number 6   Number of Visits 17   Date for PT Re-Evaluation 09/15/17   Authorization Type gcode 6   Authorization Time Period 10   PT Start Time 1015   PT Stop Time 1057   PT Time Calculation (min) 42 min   Activity Tolerance Patient tolerated treatment well;Patient limited by fatigue   Behavior During Therapy [facility] for tasks assessed/performed;Flat affect      Past Medical History:  Diagnosis Date  . Atrophic vaginitis   . Dysplasia of vagina    vin 1 - vulva and vaginal cuff  . GERD (gastroesophageal reflux disease)   . HCV infection   . HSV (herpes simplex virus) infection   . Hypercholesterolemia   . Hypertension   . Hypothyroidism   . Lung nodule    stable  . Menopausal state   . Osteoarthritis    hand involvement, cervical disc disease  . Osteoporosis   . Pelvic pain in female   . Rheumatoid arthritis ([place])   . 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 Thyroid nodule   . Vaginal Pap smear, abnormal    lgsil    Past Surgical History:  Procedure Laterality Date  . ADENOIDECTOMY    . APPENDECTOMY  1965  . [place]   left  . CHOLECYSTECTOMY    . [employer]  . laser vein surgery    . MANDIBLE RECONSTRUCTION  1992  . TOENAIL EXCISION  1998   several  . [place]  . VAGINAL HYSTERECTOMY  1983   als0- anterior/posterior colporrhaphy  . VAGINAL WOUND CLOSURE / REPAIR  1999, 2000  . VAGINAL WOUND CLOSURE / REPAIR      There were no vitals filed for this visit.      Subjective Assessment - 08/10/17 1020    Subjective Pt reports that she got a steroid  injection in her L shoulder on 8/10; reports she is having less pain and moving better. She reports no pain at this time, and no changes since last session.    Patient is accompained by: Family member   Pertinent History Pt is a 71 y.o female that presents to therapy s/p CVA; On April 24, 2017 pt suffered an acute infarct in right basal ganglia. She was in hospital 3 days; after pt was transferred to [facility] where she did inpatient therapy; Pt then completed home PT until about 2 weeks ago; Pt has pain in shoulder when moving it with R UE and reports that deltoid is paralyzed; Pt fell out of bed one day and fell on L UE; Pt reported doctor put a shot in L shoulder and will receive 3 more shots in shoulder; Pt reports no sensation or numbness and tingling with LE just some sensation impaired in L hand; Pt has an air cast for L LE and splint for wrist but does not use because difficulty donning;  Pt has had trouble sleeping at night with waking and going to the bathroom but denies any pain;  Only has pain in shoulder when moving described as throbbing; Pt reports not using her  cane in the last month and states her balance is fine without it;    Limitations Lifting;Standing;Walking   How long can you sit comfortably? hours    How long can you stand comfortably? 30 min    How long can you walk comfortably? better in the morning because more energy;    Diagnostic tests x rays in shoulder and hand; all clear; doctor stated "inferior pseudo-subluxation from paralysis of the deltoid. she has advanced left hand arthritis"   Patient Stated Goals walking better without the cane;    Currently in Pain? No/denies   Pain Onset More than a month ago       Treatment:   Ther-ex:  Leg press: BLE plate 80# 2 x 12 reps pt cued to avoid genu recurvatum, however she is very cautious with this. Pt reports that it is heavy, but does not appear to be using maximal effort.   L LE only plate 50# 2 x 12 reps; cues to  adjust foot position for better alignment. Pt reported it was not too difficult. Pt with good form.  Standing in parallel bars:   TKE resisted with green t-band 2 x 20 with visual cues using mirror and verbal cues to avoid genu recurvatum while trying to get knee as straight as possible for improved knee control.   Sitting  March resisted(green tband) x 20 LLE only, pt given tactile cues for increased height; in order to increase hip flexor strength    Neuro Re-education:   Sitting: BAPS L3 board LLE only  Clockwise and counterclockwise motion x 5 each with verbal and visual cues to touch edge of board to the ground all the way around; pt had difficulty with going into supination in counterclockwise direction;  Dorsiflexion and plantarflexion x 10 each; pt had decreased motor control, unable to consistently touch posterior board to ground (DF) despite mod cues. Pronation/ Supination x 10 each with continued difficulty with supination;         PT Education - 08/10/17 1643    Education provided Yes   Education Details ther-ex, neuro re-ed   Person(s) Educated Patient   Methods Explanation;Demonstration;Tactile cues;Verbal cues   Comprehension Verbalized understanding;Returned demonstration;Verbal cues required;Tactile cues required             PT Long Term Goals - 07/21/17 1245      PT LONG TERM GOAL #1   Title Pt will become independent with HEP in order to improve ADL independence;   Time 8   Period Weeks   Status New   Target Date 09/15/17     PT LONG TERM GOAL #2   Title Pt will decrease 5 times sit to stand to less than 15 sec with no UE use to decrease risk of fall;   Baseline 18 sec   Time 8   Period Weeks   Status New   Target Date 09/15/17     PT LONG TERM GOAL #3   Title Pt will improve gait speed in 10MWT to .8-1.44m/s in order to be considered a comunity ambulator;   Baseline .77m/s   Time 8   Period Weeks   Status New   Target Date 09/15/17     PT  LONG TERM GOAL #4   Title Pt will improve 6MWT to 800 feet in order to improve activity tolerance;   Baseline 565 feet   Time 8   Period Weeks   Status New   Target Date 09/15/17  PT LONG TERM GOAL #5   Title Pt will increase strength in L LE to 4+/5 for all muscles in order to improve mobility and function with daily activities;    Time 8   Period Weeks   Status New   Target Date 09/15/17               Plan - 08/10/17 1645    Clinical Impression Statement Pt instructed in ther-ex and neuro re-education to increase LE strength and motor control in order normalize gait for improved efficiency. Treatment focused on increased knee control to facilitate increased knee extension on heel strike and stance for better utilization of the walk aid to prevent foot drop. Pt was able to achieve functional knee extension AROM with leg press and TKE exercise without hyperextending. She will benefit from continued therapy to integrate this training into her gait pattern to maximize functional mobility and safety.   Rehab Potential Fair   Clinical Impairments Affecting Rehab Potential neg: age, lives alone, comorbidities, pos: wants to be more independent; already high functioning    PT Frequency 2x / week   PT Duration 8 weeks   PT Treatment/Interventions ADLs/Self Care Home Management;Aquatic Therapy;Moist Heat;Cryotherapy;DME Instruction;Gait training;Stair training;Functional mobility training;Therapeutic activities;Therapeutic exercise;Balance training;Neuromuscular re-education;Manual techniques;Passive range of motion;Energy conservation;Electrical Stimulation;Patient/family education;Orthotic Fit/Training   PT Next Visit Plan berg; stairs; strengthening   PT Home Exercise Plan continue as given;    Consulted and Agree with Plan of Care Patient      Patient will benefit from skilled therapeutic intervention in order to improve the following deficits and impairments:  Abnormal gait,  Impaired sensation, Decreased mobility, Decreased activity tolerance, Decreased endurance, Decreased range of motion, Decreased strength, Impaired UE functional use, Decreased balance, Decreased safety awareness, Difficulty walking, Pain  Visit Diagnosis: Muscle weakness (generalized)  Hemiplegia and hemiparesis following cerebral infarction affecting left dominant side (HCC)  Other abnormalities of gait and mobility  Other lack of coordination     Problem List Patient Active Problem List   Diagnosis Date Noted  . Adhesive capsulitis of left shoulder 08/06/2017  . Urinary frequency 07/11/2017  . Gait disturbance, post-stroke 05/31/2017  . Left spastic hemiparesis ([place]) 05/31/2017  . Adjustment disorder with mixed anxiety and depressed mood   . Hypokalemia   . HCAP (healthcare-associated pneumonia)   . Cough with fever   . 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 FUO (fever of unknown origin)   . Oral thrush   . Benign essential HTN   . Rheumatoid arthritis involving both hands ([place]) 04/28/2017  . 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[place]) 04/27/2017  . CVA (cerebral vascular accident) ([place]) 04/24/2017  . LGSIL Pap smear of vagina 12/15/2016  . Anal skin tag 02/02/2016  . Inflammatory arthritis 01/19/2016  . Right knee pain 01/19/2016  . Genital herpes 10/20/2015  . GERD (gastroesophageal reflux disease) 10/20/2015  . Status post vaginal hysterectomy 10/20/2015  . Health care maintenance 05/30/2015  . LLQ pain 03/31/2014  . Menopausal symptoms 03/31/2014  . Hypercholesterolemia 12/31/2012  . Hypothyroidism 12/29/2012  . Osteoarthritis 12/29/2012  . PULMONARY NODULE 01/02/2008  . Mild depression ([place]) 12/30/2007  . Essential hypertension 12/30/2007   Burnett Corrente, SPT This entire session was performed under direct supervision and direction of a licensed therapist/therapist assistant . I have personally read, edited and approve of the note as  written.  Trotter,Margaret PT, DPT 08/10/2017, 6:09 PM  [place] MAIN [facility] SERVICES [place],  [place], [address] Phone: [phone]   Fax:  [phone]  Name: LESIA MONICA MRN: [id number] Date of Birth: 10-22-1946

## 2017-08-10 NOTE — Therapy (Signed)
[place] MAIN [facility] SERVICES [address] [place], [place], [address] Phone: [phone]   Fax:  [phone]  Occupational Therapy Treatment  Patient Details  Name: Donna Rhodes MRN: [id number] Date of Birth: 06/14/1946 Referring Provider: Dr. Letta Pate  Encounter Date: 08/10/2017      OT End of Session - 08/10/17 1349    Visit Number 6   Number of Visits 24   Date for OT Re-Evaluation 10/13/17   Authorization Type Medicare G Code 6 of 10   OT Start Time 0933   OT Stop Time 1015   OT Time Calculation (min) 42 min   Activity Tolerance Patient tolerated treatment well   Behavior During Therapy [facility] for tasks assessed/performed      Past Medical History:  Diagnosis Date  . Atrophic vaginitis   . Dysplasia of vagina    vin 1 - vulva and vaginal cuff  . GERD (gastroesophageal reflux disease)   . HCV infection   . HSV (herpes simplex virus) infection   . Hypercholesterolemia   . Hypertension   . Hypothyroidism   . Lung nodule    stable  . Menopausal state   . Osteoarthritis    hand involvement, cervical disc disease  . Osteoporosis   . Pelvic pain in female   . Rheumatoid arthritis ([place])   . 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 Thyroid nodule   . Vaginal Pap smear, abnormal    lgsil    Past Surgical History:  Procedure Laterality Date  . ADENOIDECTOMY    . APPENDECTOMY  1965  . [place]   left  . CHOLECYSTECTOMY    . [place]  . laser vein surgery    . MANDIBLE RECONSTRUCTION  1992  . TOENAIL EXCISION  1998   several  . [place]  . VAGINAL HYSTERECTOMY  1983   als0- anterior/posterior colporrhaphy  . VAGINAL WOUND CLOSURE / REPAIR  1999, 2000  . VAGINAL WOUND CLOSURE / REPAIR      There were no vitals filed for this visit.      Subjective Assessment - 08/10/17 1347    Subjective  Pt. had an injection in her shoulder during her last appointment with Dr. Letta Pate.   Patient is accompained by: Family member   Pertinent History Pt. is a 71 y.o. female who had a CVA on 04/24/2017. Pt. went to the ER when she started not feeling well, and having heart palpitations. Pt. reports having work up initially, however was sent home. Pt. reports later in the day she was unable to write with her left hand. Pt. returned to the ER, and was admitted with workup for a CVA. Pt. was admitted to [facility], and stayed for approximately 3 weeks. Pt. returned home alone, and  received home health services for the past 2 months. Pt. now presents for Outpatient OT services. Pt. resides home alone, and has support children who stop by daily, assist with shopping, transportation, and  ADL/IADL tasks as needed.    Currently in Pain? Yes   Pain Score 8    Pain Location Shoulder   Pain Orientation Left     OT TREATMENT    Therapeutic Exercise:  Pt. tolerated PROM in all joint ranges of the LUE including: shoulder flexion, abduction, horizontal abduction, elbow flexion, extension, forearm supination, wrist extension, thumb abduction, digit extension. Pt. Performed AROM for elbow flexion, extension, forearm supination, wrist extension, and thumb abduction.  Manual Therapy:  Pt. performed scapular mobilizations for scapular elevation, depression, abduction/adduction in sitting, and sidelying. Scapular mobes were performed to help normalize tone, and prepare UE for ROM and functional use.                               OT Education - 08/10/17 1348    Education provided Yes   Education Details UE strengthening, and [facility]   Person(s) Educated Patient   Methods Explanation;Verbal cues;Tactile cues   Comprehension Verbalized understanding;Returned demonstration;Verbal cues required             OT Long Term Goals - 07/21/17 1411      OT LONG TERM GOAL #1   Title Pt. will tolerate increased shoulder PROM by 20 degrees with pain improved to  under 3/10   Baseline Flexion: 91, Abduction: 67   Time 12   Period Weeks   Status New   Target Date 10/13/17     OT LONG TERM GOAL #2   Title Pt. will formulate a full fist to be able to stabilize objects with her Left hand while using her right hand.    Baseline Digit flexion to [facility]: 2nd digit: 4cm, 3rd: 5.5, 4th: 6, 5th: 4.   Time 12   Period Weeks   Status New   Target Date 10/13/17     OT LONG TERM GOAL #3   Title Pt. will be able to wipe face independently with her Left UE.   Baseline Pt. is unable   Time 12   Period Weeks   Status New     OT LONG TERM GOAL #4   Title Pt. will demonstrate independence with splint application.   Baseline Pt. is unable to donn splint    Time 12   Period Weeks   Status New   Target Date 10/13/17     OT LONG TERM GOAL #5   Title Pt. will initiate active grasp release with thumb to 2nd digit 100% of the time to be able to hold a utensil in preparation for self-feeding.   Baseline Minimal thum movement.   Time 12   Period Weeks   Status New   Target Date 10/13/17     Long Term Additional Goals   Additional Long Term Goals Yes     OT LONG TERM GOAL #6   Title Pt. will initiate wrist extension in preparation for functional reaching.   Baseline No active wrist extension.   Time 12   Period Weeks   Status New   Target Date 10/13/17               Plan - 08/10/17 1350    Clinical Impression Statement Pt. continues to present with pain in her shoulder, however it is less today at 8/10. Pt. was able to tolerate 110 degrees of PROM for shoulder flexion, and 85 degrees of abduction. Pt. requires verbal cues for encouragement. Pt. education was provided about positioning, and LUE functioning for improved ADL, and IADLs.   Occupational performance deficits (Please refer to evaluation for details): ADL's;IADL's   Rehab Potential Good   OT Frequency 2x / week   OT Duration 12 weeks   OT Treatment/Interventions Self-care/ADL  training;DME and/or AE instruction;Energy conservation;Therapeutic exercise;Neuromuscular education;Moist Heat;Therapeutic activities;Cognitive remediation/compensation;Therapeutic exercises;Patient/family education   Consulted and Agree with Plan of Care Patient      Patient will benefit from skilled therapeutic intervention in order to improve  the following deficits and impairments:  Decreased cognition, Difficulty walking, Decreased balance, Decreased strength, Decreased safety awareness, Decreased coordination, Impaired UE functional use, Impaired tone, Decreased range of motion, Decreased endurance, Decreased activity tolerance, Impaired perceived functional ability, Pain  Visit Diagnosis: Muscle weakness (generalized)  Other lack of coordination    Problem List Patient Active Problem List   Diagnosis Date Noted  . Adhesive capsulitis of left shoulder 08/06/2017  . Urinary frequency 07/11/2017  . Gait disturbance, post-stroke 05/31/2017  . Left spastic hemiparesis ([place]) 05/31/2017  . Adjustment disorder with mixed anxiety and depressed mood   . Hypokalemia   . HCAP (healthcare-associated pneumonia)   . Cough with fever   . 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 FUO (fever of unknown origin)   . Oral thrush   . Benign essential HTN   . Rheumatoid arthritis involving both hands ([place]) 04/28/2017  . 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[place]) 04/27/2017  . CVA (cerebral vascular accident) ([place]) 04/24/2017  . LGSIL Pap smear of vagina 12/15/2016  . Anal skin tag 02/02/2016  . Inflammatory arthritis 01/19/2016  . Right knee pain 01/19/2016  . Genital herpes 10/20/2015  . GERD (gastroesophageal reflux disease) 10/20/2015  . Status post vaginal hysterectomy 10/20/2015  . Health care maintenance 05/30/2015  . LLQ pain 03/31/2014  . Menopausal symptoms 03/31/2014  . Hypercholesterolemia 12/31/2012  . Hypothyroidism 12/29/2012  . Osteoarthritis 12/29/2012  . PULMONARY NODULE  01/02/2008  . Mild depression ([place]) 12/30/2007  . Essential hypertension 12/30/2007    Harrel Carina, MS, OTR/L 08/10/2017, 2:14 PM  [place] MAIN [facility] SERVICES [address] [place], [place], [address] Phone: [phone]   Fax:  [phone]  Name: Donna Rhodes MRN: [id number] Date of Birth: [birth year]

## 2017-08-11 ENCOUNTER — Telehealth: Payer: Self-pay | Admitting: Diagnostic Neuroimaging

## 2017-08-11 NOTE — Telephone Encounter (Signed)
Dr. Leta Baptist , Lenna Sciara is calling from Cardiology and she  needs order for 30- day cardiac event monitor. [phone]. Thanks Hinton Dyer.

## 2017-08-12 ENCOUNTER — Ambulatory Visit: Payer: PPO | Admitting: Occupational Therapy

## 2017-08-12 DIAGNOSIS — M6281 Muscle weakness (generalized): Secondary | ICD-10-CM

## 2017-08-12 DIAGNOSIS — H2513 Age-related nuclear cataract, bilateral: Secondary | ICD-10-CM | POA: Diagnosis not present

## 2017-08-12 DIAGNOSIS — H35372 Puckering of macula, left eye: Secondary | ICD-10-CM | POA: Diagnosis not present

## 2017-08-12 NOTE — Therapy (Signed)
[place] MAIN [facility] SERVICES [address] [place], [place], [address] Phone: [phone]   Fax:  [phone]  Occupational Therapy Treatment  Patient Details  Name: Donna Rhodes MRN: [id number] Date of Birth: Aug 31, 1946 Referring Provider: Dr. Letta Pate  Encounter Date: 08/12/2017      OT End of Session - 08/12/17 1212    Visit Number 7   Number of Visits 24   Date for OT Re-Evaluation 10/13/17   Authorization Type Medicare G Code 7 of 10   OT Start Time 0932   OT Stop Time 1015   OT Time Calculation (min) 43 min   Activity Tolerance Patient tolerated treatment well   Behavior During Therapy [facility] for tasks assessed/performed      Past Medical History:  Diagnosis Date  . Atrophic vaginitis   . Dysplasia of vagina    vin 1 - vulva and vaginal cuff  . GERD (gastroesophageal reflux disease)   . HCV infection   . HSV (herpes simplex virus) infection   . Hypercholesterolemia   . Hypertension   . Hypothyroidism   . Lung nodule    stable  . Menopausal state   . Osteoarthritis    hand involvement, cervical disc disease  . Osteoporosis   . Pelvic pain in female   . Rheumatoid arthritis ([place])   . Stroke (cerebrum) ([place]) 04/24/2017  . Thyroid nodule   . Vaginal Pap smear, abnormal    lgsil    Past Surgical History:  Procedure Laterality Date  . ADENOIDECTOMY    . APPENDECTOMY  1965  . [place]   left  . CHOLECYSTECTOMY    . [place]  . laser vein surgery    . MANDIBLE RECONSTRUCTION  1992  . TOENAIL EXCISION  1998   several  . [place]  . VAGINAL HYSTERECTOMY  1983   als0- anterior/posterior colporrhaphy  . VAGINAL WOUND CLOSURE / REPAIR  1999, 2000  . VAGINAL WOUND CLOSURE / REPAIR      There were no vitals filed for this visit.      Subjective Assessment - 08/12/17 1210    Subjective  Pt. with improved pain overall today   Patient is accompained by: Family member    Limitations Pt. is unable to use her dominant RUE during functional tasks.   Currently in Pain? Yes   Pain Score 6   intermittently. No pain today with ROM.   Pain Location Shoulder   Pain Orientation Left      OT TREATMENT    Therapeutic Exercise:  Pt. tolerated PROM in all joint ranges of the LUE including: shoulder flexion, abduction, horizontal abduction, elbow flexion, extension, forearm supination, wrist extension, thumb abduction, digit extension. Pt. Performed AROM for elbow flexion, extension, forearm supination, wrist extension, and thumb abduction. Pt. Worked on hand to face patterns with her right hand in preparation for light grooming.  Manual Therapy:  Pt. performed scapular mobilizations for scapular elevation, depression, abduction/adduction in sitting, and sidelying. Scapular mobes were performed to help normalize tone, and prepare UE for ROM and functional use.                                OT Long Term Goals - 07/21/17 1411      OT LONG TERM GOAL #1   Title Pt. will tolerate increased shoulder PROM by 20 degrees with pain improved to  under 3/10   Baseline Flexion: 91, Abduction: 67   Time 12   Period Weeks   Status New   Target Date 10/13/17     OT LONG TERM GOAL #2   Title Pt. will formulate a full fist to be able to stabilize objects with her Left hand while using her right hand.    Baseline Digit flexion to [facility]: 2nd digit: 4cm, 3rd: 5.5, 4th: 6, 5th: 4.   Time 12   Period Weeks   Status New   Target Date 10/13/17     OT LONG TERM GOAL #3   Title Pt. will be able to wipe face independently with her Left UE.   Baseline Pt. is unable   Time 12   Period Weeks   Status New     OT LONG TERM GOAL #4   Title Pt. will demonstrate independence with splint application.   Baseline Pt. is unable to donn splint    Time 12   Period Weeks   Status New   Target Date 10/13/17     OT LONG TERM GOAL #5   Title Pt. will initiate  active grasp release with thumb to 2nd digit 100% of the time to be able to hold a utensil in preparation for self-feeding.   Baseline Minimal thum movement.   Time 12   Period Weeks   Status New   Target Date 10/13/17     Long Term Additional Goals   Additional Long Term Goals Yes     OT LONG TERM GOAL #6   Title Pt. will initiate wrist extension in preparation for functional reaching.   Baseline No active wrist extension.   Time 12   Period Weeks   Status New   Target Date 10/13/17               Plan - 08/12/17 1212    Clinical Impression Statement Pt. has complaints of ailments with her LUE, and often requires cues for redirection. Pt. requires verbal cues, encouragement, and positive reinforcement. Overall pain is improved since having her injection. Pt. is able to tolerate gentle PROM in left shoulder flexion, and extension in supine. Pt. is achieving active elbow flexion, extension, forearm supination, pronation, wrist extension, and minimal thumb abduction responses. Pt. continues to work on improving LUE functioning to improve engagement in ADL, and IADL tasks.   Occupational performance deficits (Please refer to evaluation for details): ADL's;IADL's   Rehab Potential Good   OT Frequency 2x / week   OT Duration 12 weeks   OT Treatment/Interventions Self-care/ADL training;DME and/or AE instruction;Energy conservation;Therapeutic exercise;Neuromuscular education;Moist Heat;Therapeutic activities;Cognitive remediation/compensation;Therapeutic exercises;Patient/family education   Consulted and Agree with Plan of Care Patient      Patient will benefit from skilled therapeutic intervention in order to improve the following deficits and impairments:  Decreased cognition, Difficulty walking, Decreased balance, Decreased strength, Decreased safety awareness, Decreased coordination, Impaired UE functional use, Impaired tone, Decreased range of motion, Decreased endurance,  Decreased activity tolerance, Impaired perceived functional ability, Pain  Visit Diagnosis: Muscle weakness (generalized)    Problem List Patient Active Problem List   Diagnosis Date Noted  . Adhesive capsulitis of left shoulder 08/06/2017  . Urinary frequency 07/11/2017  . Gait disturbance, post-stroke 05/31/2017  . Left spastic hemiparesis ([place]) 05/31/2017  . Adjustment disorder with mixed anxiety and depressed mood   . Hypokalemia   . HCAP (healthcare-associated pneumonia)   . Cough with fever   . 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  FUO (fever of unknown origin)   . Oral thrush   . Benign essential HTN   . Rheumatoid arthritis involving both hands ([place]) 04/28/2017  . 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[place]) 04/27/2017  . CVA (cerebral vascular accident) ([place]) 04/24/2017  . LGSIL Pap smear of vagina 12/15/2016  . Anal skin tag 02/02/2016  . Inflammatory arthritis 01/19/2016  . Right knee pain 01/19/2016  . Genital herpes 10/20/2015  . GERD (gastroesophageal reflux disease) 10/20/2015  . Status post vaginal hysterectomy 10/20/2015  . Health care maintenance 05/30/2015  . LLQ pain 03/31/2014  . Menopausal symptoms 03/31/2014  . Hypercholesterolemia 12/31/2012  . Hypothyroidism 12/29/2012  . Osteoarthritis 12/29/2012  . PULMONARY NODULE 01/02/2008  . Mild depression ([place]) 12/30/2007  . Essential hypertension 12/30/2007    Harrel Carina, MS, OTR/L 08/12/2017, 12:24 PM  [place] MAIN [facility] SERVICES [address] [place], [place], [address] Phone: [phone]   Fax:  [phone]  Name: ARDRA KUZNICKI MRN: [id number] Date of Birth: 26-May-1946

## 2017-08-13 ENCOUNTER — Other Ambulatory Visit: Payer: Self-pay | Admitting: *Deleted

## 2017-08-13 ENCOUNTER — Encounter: Payer: Self-pay | Admitting: *Deleted

## 2017-08-13 DIAGNOSIS — R350 Frequency of micturition: Secondary | ICD-10-CM | POA: Diagnosis not present

## 2017-08-13 DIAGNOSIS — R351 Nocturia: Secondary | ICD-10-CM | POA: Insufficient documentation

## 2017-08-13 DIAGNOSIS — N3941 Urge incontinence: Secondary | ICD-10-CM | POA: Diagnosis not present

## 2017-08-13 DIAGNOSIS — N393 Stress incontinence (female) (male): Secondary | ICD-10-CM | POA: Diagnosis not present

## 2017-08-13 DIAGNOSIS — R339 Retention of urine, unspecified: Secondary | ICD-10-CM | POA: Diagnosis not present

## 2017-08-13 NOTE — Patient Outreach (Signed)
[place] [facility]) Care Management [place] Telephone Outreach  08/13/2017  Donna Rhodes 11-07-1946 [id number]  Unsuccessful telephone outreach to Donna Rhodes, 71 y/o female referred 08/12/17 to [place] by insurance plan for assessment/ evaluation of patient care management/ coordination needs.  Patient has history including, but not limited to, HTN, [name], GERD, arthritis, and CVA.  Patient was last hospitalized April 28-Apr 27, 2017 for acute (R) lacunar CVA and was discharged to [facility]; was subsequently discharged home from [place] on May 19, 2017; currently attending outpatient rehabilitation for PT/ OT.  PCP Dr. Einar Rhodes, Donna Rhodes, last PCP office visit:  08/02/17  HIPAA compliant voice mail message left for patient, requesting return call back.  Plan:  Will re-attempt [place] telephone outreach again next week if I do not hear back from patient first.  Oneta Rack, RN, BSN, [place] Coordinator [facility] Care Management  [phone]

## 2017-08-16 ENCOUNTER — Other Ambulatory Visit: Payer: Self-pay | Admitting: *Deleted

## 2017-08-16 ENCOUNTER — Encounter: Payer: Self-pay | Admitting: *Deleted

## 2017-08-16 ENCOUNTER — Other Ambulatory Visit (INDEPENDENT_AMBULATORY_CARE_PROVIDER_SITE_OTHER): Payer: PPO

## 2017-08-16 DIAGNOSIS — E7800 Pure hypercholesterolemia, unspecified: Secondary | ICD-10-CM

## 2017-08-16 DIAGNOSIS — I10 Essential (primary) hypertension: Secondary | ICD-10-CM | POA: Diagnosis not present

## 2017-08-16 DIAGNOSIS — D649 Anemia, unspecified: Secondary | ICD-10-CM

## 2017-08-16 LAB — BASIC METABOLIC PANEL
BUN: 16 mg/dL (ref 6–23)
CHLORIDE: 105 meq/L (ref 96–112)
CO2: 29 meq/L (ref 19–32)
CREATININE: 0.74 mg/dL (ref 0.40–1.20)
Calcium: 9.5 mg/dL (ref 8.4–10.5)
GFR: 82.05 mL/min (ref >60.00)
GLUCOSE: 93 mg/dL (ref 70–99)
Potassium: 4.2 mEq/L (ref 3.5–5.1)
Sodium: 141 mEq/L (ref 135–145)

## 2017-08-16 LAB — CBC WITH DIFFERENTIAL/PLATELET
BASOS ABS: 0.1 10*3/uL (ref 0.0–0.1)
Basophils Relative: 1.9 % (ref 0.0–3.0)
EOS ABS: 0.2 10*3/uL (ref 0.0–0.7)
Eosinophils Relative: 2.9 % (ref 0.0–5.0)
HCT: 37.9 % (ref 36.0–46.0)
Hemoglobin: 12.4 g/dL (ref 12.0–15.0)
LYMPHS ABS: 1.6 10*3/uL (ref 0.7–4.0)
LYMPHS PCT: 23.4 % (ref 12.0–46.0)
MCHC: 32.8 g/dL (ref 30.0–36.0)
MCV: 96.1 fl (ref 78.0–100.0)
MONO ABS: 0.5 10*3/uL (ref 0.1–1.0)
MONOS PCT: 7.1 % (ref 3.0–12.0)
NEUTROS PCT: 64.7 % (ref 43.0–77.0)
Neutro Abs: 4.4 10*3/uL (ref 1.4–7.7)
Platelets: 252 10*3/uL (ref 150.0–400.0)
RBC: 3.95 Mil/uL (ref 3.87–5.11)
RDW: 15.1 % (ref 11.5–15.5)
WBC: 6.9 10*3/uL (ref 4.0–10.5)

## 2017-08-16 LAB — HEPATIC FUNCTION PANEL
ALBUMIN: 4.1 g/dL (ref 3.5–5.2)
ALT: 28 U/L (ref 0–35)
AST: 23 U/L (ref 0–37)
Alkaline Phosphatase: 73 U/L (ref 39–117)
Bilirubin, Direct: 0.2 mg/dL (ref 0.0–0.3)
TOTAL PROTEIN: 7.2 g/dL (ref 6.0–8.3)
Total Bilirubin: 0.6 mg/dL (ref 0.2–1.2)

## 2017-08-16 LAB — VITAMIN B12: Vitamin B-12: 348 pg/mL (ref 211–911)

## 2017-08-16 LAB — LIPID PANEL
CHOLESTEROL: 119 mg/dL (ref 0–200)
HDL: 55 mg/dL (ref >39.00)
LDL Cholesterol: 47 mg/dL (ref 0–99)
NONHDL: 64.02
Total CHOL/HDL Ratio: 2
Triglycerides: 84 mg/dL (ref 0.0–149.0)
VLDL: 16.8 mg/dL (ref 0.0–40.0)

## 2017-08-16 LAB — FERRITIN: FERRITIN: 90.6 ng/mL (ref 10.0–291.0)

## 2017-08-16 NOTE — Patient Outreach (Signed)
[facility]) Care Management [place] Telephone Outreach  08/16/2017  Donna Rhodes [birth year] [id number]  Successful telephone outreach to Donna Rhodes, 71 y/o female referred 08/12/17 to [place] by insurance plan for assessment/ evaluation of patient care management/ coordination needs.  Patient has history including, but not limited to, HTN, [name], GERD, arthritis, and CVA.  HIPAA/ identity verified with patient during phone call today, pleasant 10 minute phone call.  Patient was last hospitalized April 28-Apr 27, 2017 for acute (R) lacunar CVA and was discharged to [facility]; was subsequently discharged home from [place] on May 19, 2017; currently attending outpatient rehabilitation for PT/ OT.  PCP Dr. Einar Phe[place], [place], last PCP office visit:  08/02/17  Today, patient reports that she is "doing just fine," and she denies pain, concerns, or needs; patient sounds to be in no apparent distress throughout entirety of today's phone call.    Discussed [facility] CM services with patient, and she flatly declined all [facility] CM services, and wanted to know how and why [facility] CM became involved with her care; I explained that [facility] works with patient's medical/ insurance providers to coordinate care needs of their patient's, adding that the service was free of charge to the patient.  Patient verbally expressed that she was not in need of any such assistance, although later on in our brief conversation, she did mention that she may have transportation needs "in the future" to get to and from OP physical therapy; I explained that [place] services could assist with finding transportation services available to her, and she again declined, stating that she had 2 daughters who she needed to discuss this with first.  Patient also reported that she was "completely able to take care of" herself, although she reported that her (L) arm "is totally paralyzed after the  stroke."  I discussed possibilities around how [facility] CM could possibly assist around this challenge as well as her potential upcoming transportation challenge, however, patient again flatly declined all [facility] CM services, stating that she "does not have any challenges with" her care and "is doing fine."  I confirmed that patient has my telephone number should she change her mind in the future.  Plan:  Will make [facility] CMA aware that patient has declined [facility] CM services at this time.  Oneta Rack, RN, BSN, [employer] [facility] Care Management  [phone]

## 2017-08-17 ENCOUNTER — Encounter: Payer: Self-pay | Admitting: Physical Therapy

## 2017-08-17 ENCOUNTER — Ambulatory Visit: Payer: PPO | Admitting: Physical Therapy

## 2017-08-17 ENCOUNTER — Ambulatory Visit: Payer: PPO | Admitting: Occupational Therapy

## 2017-08-17 DIAGNOSIS — M6281 Muscle weakness (generalized): Secondary | ICD-10-CM | POA: Diagnosis not present

## 2017-08-17 DIAGNOSIS — I69352 Hemiplegia and hemiparesis following cerebral infarction affecting left dominant side: Secondary | ICD-10-CM

## 2017-08-17 DIAGNOSIS — R2689 Other abnormalities of gait and mobility: Secondary | ICD-10-CM

## 2017-08-17 DIAGNOSIS — R278 Other lack of coordination: Secondary | ICD-10-CM

## 2017-08-17 NOTE — Progress Notes (Signed)
Letter mailed

## 2017-08-17 NOTE — Therapy (Signed)
[place] MAIN [facility] SERVICES [address] [place], [place], [address] Phone: [phone]   Fax:  [phone]  Physical Therapy Treatment  Patient Details  Name: Donna Rhodes MRN: [id number] Date of Birth: [birth year] Referring Provider: Alysia Penna  Encounter Date: 08/17/2017      PT End of Session - 08/17/17 1043    Visit Number 7   Number of Visits 17   Date for PT Re-Evaluation 09/15/17   Authorization Type gcode 7   Authorization Time Period 10   PT Start Time 1115   PT Stop Time 1200   PT Time Calculation (min) 45 min   Equipment Utilized During Treatment Gait belt   Activity Tolerance Patient tolerated treatment well;Patient limited by fatigue   Behavior During Therapy [facility] for tasks assessed/performed;Flat affect      Past Medical History:  Diagnosis Date  . Atrophic vaginitis   . Dysplasia of vagina    vin 1 - vulva and vaginal cuff  . GERD (gastroesophageal reflux disease)   . HCV infection   . HSV (herpes simplex virus) infection   . Hypercholesterolemia   . Hypertension   . Hypothyroidism   . Lung nodule    stable  . Menopausal state   . Osteoarthritis    hand involvement, cervical disc disease  . Osteoporosis   . Pelvic pain in female   . Rheumatoid arthritis ([place])   . Stroke (cerebrum) ([place]) 04/24/2017  . Thyroid nodule   . Vaginal Pap smear, abnormal    lgsil    Past Surgical History:  Procedure Laterality Date  . ADENOIDECTOMY    . APPENDECTOMY  1965  . [place]   left  . CHOLECYSTECTOMY    . [place]  . laser vein surgery    . MANDIBLE RECONSTRUCTION  1992  . TOENAIL EXCISION  1998   several  . [place]  . VAGINAL HYSTERECTOMY  1983   als0- anterior/posterior colporrhaphy  . VAGINAL WOUND CLOSURE / REPAIR  1999, 2000  . VAGINAL WOUND CLOSURE / REPAIR      There were no vitals filed for this visit.      Subjective Assessment - 08/17/17 1042     Subjective Pt reports that she keeps losing weight and is trying to drink more muscle milk and shakes to gain weight; Pt reports loss of appetite; she reports a drop in cholesterol (119); Went to the doctor for urinary frequency and doctor said it was a result of the stroke and is not taking medication doctor gave her;    Patient is accompained by: Family member   Pertinent History Pt is a 71 y.o female that presents to therapy s/p CVA; On April 24, 2017 pt suffered an acute infarct in right basal ganglia. She was in hospital 3 days; after pt was transferred to [facility] where she did inpatient therapy; Pt then completed home PT until about 2 weeks ago; Pt has pain in shoulder when moving it with R UE and reports that deltoid is paralyzed; Pt fell out of bed one day and fell on L UE; Pt reported doctor put a shot in L shoulder and will receive 3 more shots in shoulder; Pt reports no sensation or numbness and tingling with LE just some sensation impaired in L hand; Pt has an air cast for L LE and splint for wrist but does not use because difficulty donning;  Pt has had trouble sleeping  at night with waking and going to the bathroom but denies any pain;  Only has pain in shoulder when moving described as throbbing; Pt reports not using her cane in the last month and states her balance is fine without it;    Limitations Lifting;Standing;Walking   How long can you sit comfortably? hours    How long can you stand comfortably? 30 min    How long can you walk comfortably? better in the morning because more energy;    Diagnostic tests x rays in shoulder and hand; all clear; doctor stated "inferior pseudo-subluxation from paralysis of the deltoid. she has advanced left hand arthritis"   Patient Stated Goals walking better without the cane;    Currently in Pain? No/denies   Pain Onset More than a month ago   Multiple Pain Sites No     PT TREATMENT;  Nu step x 4 min BLE (unbilled)  Pt reeducate on HEP; L  Hip flexion with orange tband 2 x 15;  BAPS board;  clockwise and counterclockwise motion; x 5 ea way;  Dorsiflexion and plantarflexion x 5 ea Eversion and inversion; x 5 ea; pt had more difficulty with eversion and inversion movement;  Pt had difficulty controlling slow movement with the ankle in most directions;  L Single Squat to table with HHA 2 x 10; table was elevated so pt squat was about 30 degrees  Heel raises with eccentric lowering 2 x 15;   TKE with ball behind knee 2 x 10 with 3 sec hold   Lateral/ fwd step downs; 2 x 10; with verbal cues to slow movement and solely push through L LE;    Walk aide gait training;#4 intensity;  5 laps (19feet ea) and  3 x 4 steps up/down; today the walk aide was able to identify the toe off and stimulate more frequently than before; Pt was verbally cued and able to get more L LE extension and decrease hip circumduction; 2 laps (80 feet) with no walk aide pt when placing L LE heel would cross R LE and throw pt off balance (scissoring); Pt with cues was still unable to control L LE placement;   Pt tolerated treatment well;                             PT Education - 08/17/17 1042    Education provided Yes   Education Details strengthening, gait with walk aide   Person(s) Educated Patient   Methods Explanation;Verbal cues;Tactile cues   Comprehension Verbalized understanding;Returned demonstration;Verbal cues required             PT Long Term Goals - 07/21/17 1245      PT LONG TERM GOAL #1   Title Pt will become independent with HEP in order to improve ADL independence;   Time 8   Period Weeks   Status New   Target Date 09/15/17     PT LONG TERM GOAL #2   Title Pt will decrease 5 times sit to stand to less than 15 sec with no UE use to decrease risk of fall;   Baseline 18 sec   Time 8   Period Weeks   Status New   Target Date 09/15/17     PT LONG TERM GOAL #3   Title Pt will improve gait speed in  10MWT to .8-1.47m/s in order to be considered a comunity ambulator;   Baseline .40m/s   Time 8  Period Weeks   Status New   Target Date 09/15/17     PT LONG TERM GOAL #4   Title Pt will improve 6MWT to 800 feet in order to improve activity tolerance;   Baseline 565 feet   Time 8   Period Weeks   Status New   Target Date 09/15/17     PT LONG TERM GOAL #5   Title Pt will increase strength in L LE to 4+/5 for all muscles in order to improve mobility and function with daily activities;    Time 8   Period Weeks   Status New   Target Date 09/15/17               Plan - 08/17/17 1043    Clinical Impression Statement Pt instructed in ther ex for strengthening L LE; PT focus on quad strengthening to decrease knee hyperextension and have more control of LE during gait; PT also instructed in hip abductor strengthening to decrease hip adduction during gait, pt demonstrated good control with step downs and reported fatigue with exercise; Pt instructed in BAPS board training in order to improve ankle control to decrease ankle inversion during gait; Walk aide gait training has improved with more successful and accurate stimulation through gait cycle; Pt demonstrated little carryover without the walk aide; Pt would continue to benefit from skilled PT in order to improve strength, balance, and gait safety;    Rehab Potential Fair   Clinical Impairments Affecting Rehab Potential neg: age, lives alone, comorbidities, pos: wants to be more independent; already high functioning    PT Frequency 2x / week   PT Duration 8 weeks   PT Treatment/Interventions ADLs/Self Care Home Management;Aquatic Therapy;Moist Heat;Cryotherapy;DME Instruction;Gait training;Stair training;Functional mobility training;Therapeutic activities;Therapeutic exercise;Balance training;Neuromuscular re-education;Manual techniques;Passive range of motion;Energy conservation;Electrical Stimulation;Patient/family education;Orthotic  Fit/Training   PT Next Visit Plan berg; stairs; strengthening   PT Home Exercise Plan continue as given;    Consulted and Agree with Plan of Care Patient      Patient will benefit from skilled therapeutic intervention in order to improve the following deficits and impairments:  Abnormal gait, Impaired sensation, Decreased mobility, Decreased activity tolerance, Decreased endurance, Decreased range of motion, Decreased strength, Impaired UE functional use, Decreased balance, Decreased safety awareness, Difficulty walking, Pain  Visit Diagnosis: Muscle weakness (generalized)  Hemiplegia and hemiparesis following cerebral infarction affecting left dominant side (HCC)  Other abnormalities of gait and mobility  Other lack of coordination     Problem List Patient Active Problem List   Diagnosis Date Noted  . Adhesive capsulitis of left shoulder 08/06/2017  . Urinary frequency 07/11/2017  . Gait disturbance, post-stroke 05/31/2017  . Left spastic hemiparesis ([place]) 05/31/2017  . Adjustment disorder with mixed anxiety and depressed mood   . Hypokalemia   . HCAP (healthcare-associated pneumonia)   . Cough with fever   . 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 FUO (fever of unknown origin)   . Oral thrush   . Benign essential HTN   . Rheumatoid arthritis involving both hands ([place]) 04/28/2017  . 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[place]) 04/27/2017  . CVA (cerebral vascular accident) ([place]) 04/24/2017  . LGSIL Pap smear of vagina 12/15/2016  . Anal skin tag 02/02/2016  . Inflammatory arthritis 01/19/2016  . Right knee pain 01/19/2016  . Genital herpes 10/20/2015  . GERD (gastroesophageal reflux disease) 10/20/2015  . Status post vaginal hysterectomy 10/20/2015  . Health care maintenance 05/30/2015  . LLQ pain 03/31/2014  . Menopausal  symptoms 03/31/2014  . Hypercholesterolemia 12/31/2012  . Hypothyroidism 12/29/2012  . Osteoarthritis 12/29/2012  . PULMONARY NODULE 01/02/2008   . Mild depression ([place]) 12/30/2007  . Essential hypertension 12/30/2007   Doreene Nest SPT This entire session was performed under direct supervision and direction of a licensed Chiropractor . I have personally read, edited and approve of the note as written.  Trotter,Margaret PT, DPT 08/17/2017, 12:46 PM  [place] MAIN [facility] SERVICES [address] [place], [place], [address] Phone: [phone]   Fax:  [phone]  Name: Donna Rhodes MRN: [id number] Date of Birth: [birth year]

## 2017-08-18 ENCOUNTER — Telehealth: Payer: Self-pay | Admitting: Internal Medicine

## 2017-08-18 NOTE — Telephone Encounter (Signed)
Pt called looking for an update on some medication that she asked about yesterday. Please advise, thank you!  Call pt @ [phone]

## 2017-08-18 NOTE — Telephone Encounter (Signed)
See lab results note for documentation.  

## 2017-08-19 ENCOUNTER — Ambulatory Visit: Payer: PPO | Admitting: Occupational Therapy

## 2017-08-19 ENCOUNTER — Ambulatory Visit: Payer: PPO | Admitting: Physical Therapy

## 2017-08-19 ENCOUNTER — Encounter: Payer: Self-pay | Admitting: Physical Therapy

## 2017-08-19 DIAGNOSIS — R278 Other lack of coordination: Secondary | ICD-10-CM

## 2017-08-19 DIAGNOSIS — R2689 Other abnormalities of gait and mobility: Secondary | ICD-10-CM

## 2017-08-19 DIAGNOSIS — M6281 Muscle weakness (generalized): Secondary | ICD-10-CM | POA: Diagnosis not present

## 2017-08-19 DIAGNOSIS — I69352 Hemiplegia and hemiparesis following cerebral infarction affecting left dominant side: Secondary | ICD-10-CM

## 2017-08-19 NOTE — Therapy (Signed)
[place] MAIN [facility] SERVICES [address] [place], [place], [address] Phone: [phone]   Fax:  [phone]  Physical Therapy Treatment  Patient Details  Name: Donna Rhodes MRN: [id number] Date of Birth: [birth year] Referring Provider: Alysia Penna  Encounter Date: 08/19/2017      PT End of Session - 08/19/17 1134    Visit Number 8   Number of Visits 17   Date for PT Re-Evaluation 09/15/17   Authorization Type gcode 8   Authorization Time Period 10   PT Start Time 1145   PT Stop Time 1230   PT Time Calculation (min) 45 min   Equipment Utilized During Treatment Gait belt   Activity Tolerance Patient tolerated treatment well;Patient limited by fatigue   Behavior During Therapy [facility] for tasks assessed/performed;Flat affect      Past Medical History:  Diagnosis Date  . Atrophic vaginitis   . Dysplasia of vagina    vin 1 - vulva and vaginal cuff  . GERD (gastroesophageal reflux disease)   . HCV infection   . HSV (herpes simplex virus) infection   . Hypercholesterolemia   . Hypertension   . Hypothyroidism   . Lung nodule    stable  . Menopausal state   . Osteoarthritis    hand involvement, cervical disc disease  . Osteoporosis   . Pelvic pain in female   . Rheumatoid arthritis ([place])   . 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 Thyroid nodule   . Vaginal Pap smear, abnormal    lgsil    Past Surgical History:  Procedure Laterality Date  . ADENOIDECTOMY    . APPENDECTOMY  1965  . [place]   left  . CHOLECYSTECTOMY    . [place]  . laser vein surgery    . MANDIBLE RECONSTRUCTION  1992  . TOENAIL EXCISION  1998   several  . [place]  . VAGINAL HYSTERECTOMY  1983   als0- anterior/posterior colporrhaphy  . VAGINAL WOUND CLOSURE / REPAIR  1999, 2000  . VAGINAL WOUND CLOSURE / REPAIR      There were no vitals filed for this visit.      Subjective Assessment - 08/19/17 1133     Subjective Pt reports arm and hand are tingling today; Pt states when she stretches leg at night the left arm will bunch up;   Patient is accompained by: Family member   Pertinent History Pt is a 71 y.o female that presents to therapy s/p CVA; On April 24, 2017 pt suffered an acute infarct in right basal ganglia. She was in hospital 3 days; after pt was transferred to [facility] where she did inpatient therapy; Pt then completed home PT until about 2 weeks ago; Pt has pain in shoulder when moving it with R UE and reports that deltoid is paralyzed; Pt fell out of bed one day and fell on L UE; Pt reported doctor put a shot in L shoulder and will receive 3 more shots in shoulder; Pt reports no sensation or numbness and tingling with LE just some sensation impaired in L hand; Pt has an air cast for L LE and splint for wrist but does not use because difficulty donning;  Pt has had trouble sleeping at night with waking and going to the bathroom but denies any pain;  Only has pain in shoulder when moving described as throbbing; Pt reports not using her cane in the last month and states  her balance is fine without it;    Limitations Lifting;Standing;Walking   How long can you sit comfortably? hours    How long can you stand comfortably? 30 min    How long can you walk comfortably? better in the morning because more energy;    Diagnostic tests x rays in shoulder and hand; all clear; doctor stated "inferior pseudo-subluxation from paralysis of the deltoid. she has advanced left hand arthritis"   Patient Stated Goals walking better without the cane;    Currently in Pain? No/denies   Pain Onset More than a month ago   Multiple Pain Sites No       PT TREATMENT;     Nu step x 4 min BLE (unbilled)   Leg press: BLE plate 75# x 12 reps Leg press, L LE only plate 45# 2 x 54?OEVO Straight leg calf raise plate 75# 2 x 12 reps; Pt states that ankle turns in with exercise;  Pt required verbal and tactile cues  to improve knee positioning and to prevent hyperextension for better strengthening and control;    Sidelying; L clam shell 2 x 20;  Pt unable to do straight leg abduction without going into hip flexion;   5 squats to table with HHA; 10 squats to table with HHA and RLE in front of LLE;    Seated; L LE LAQ with 3# ankle weight; to improve quad strength; 2 x 15 pt began to fatigue and not reach full ROM;   Seated Hip abduction with green tband 2 x 15; to improve hip abductor strength; Pt cued to increase range with L LE to get more muscle activation;  TKE with red tband 2 x 10 with 3 sec hold;  Side stepping in mini squat with green tband 4 x 10 feet; Pt reported muscle fatigue after exercise;    Lateral step downs; 2 x 15; with verbal cues to slow movement and solely push through L LE;   Pt tolerated treatment well;                           PT Education - 08/19/17 1133    Education provided Yes   Education Details strengthening and balance   Person(s) Educated Patient   Methods Explanation;Verbal cues;Tactile cues   Comprehension Verbalized understanding;Returned demonstration;Verbal cues required             PT Long Term Goals - 07/21/17 1245      PT LONG TERM GOAL #1   Title Pt will become independent with HEP in order to improve ADL independence;   Time 8   Period Weeks   Status New   Target Date 09/15/17     PT LONG TERM GOAL #2   Title Pt will decrease 5 times sit to stand to less than 15 sec with no UE use to decrease risk of fall;   Baseline 18 sec   Time 8   Period Weeks   Status New   Target Date 09/15/17     PT LONG TERM GOAL #3   Title Pt will improve gait speed in 10MWT to .8-1.61m/s in order to be considered a comunity ambulator;   Baseline .76m/s   Time 8   Period Weeks   Status New   Target Date 09/15/17     PT LONG TERM GOAL #4   Title Pt will improve 6MWT to 800 feet in order to improve activity tolerance;   Baseline  565 feet   Time 8   Period Weeks   Status New   Target Date 09/15/17     PT LONG TERM GOAL #5   Title Pt will increase strength in L LE to 4+/5 for all muscles in order to improve mobility and function with daily activities;    Time 8   Period Weeks   Status New   Target Date 09/15/17               Plan - 08/19/17 1134    Clinical Impression Statement Pt was educated in today in LE strengthening and balance exercise; Pt is very self conscious of gait and L LE not being placed where she wants to; Pt performed multiple exercises for abductor strengthening in order to see if that improves her gait mechanics; Pt also continues to increase weight and reps for quad strengthening; Pt requires cues for technique and position for optimal muscle strengethening; Pt will continue to benefit from skilled PT in order to improve strength, balance, and gait safety;    Rehab Potential Fair   Clinical Impairments Affecting Rehab Potential neg: age, lives alone, comorbidities, pos: wants to be more independent; already high functioning    PT Frequency 2x / week   PT Duration 8 weeks   PT Treatment/Interventions ADLs/Self Care Home Management;Aquatic Therapy;Moist Heat;Cryotherapy;DME Instruction;Gait training;Stair training;Functional mobility training;Therapeutic activities;Therapeutic exercise;Balance training;Neuromuscular re-education;Manual techniques;Passive range of motion;Energy conservation;Electrical Stimulation;Patient/family education;Orthotic Fit/Training   PT Next Visit Plan berg; stairs; strengthening   PT Home Exercise Plan continue as given;    Consulted and Agree with Plan of Care Patient      Patient will benefit from skilled therapeutic intervention in order to improve the following deficits and impairments:  Abnormal gait, Impaired sensation, Decreased mobility, Decreased activity tolerance, Decreased endurance, Decreased range of motion, Decreased strength, Impaired UE  functional use, Decreased balance, Decreased safety awareness, Difficulty walking, Pain  Visit Diagnosis: Muscle weakness (generalized)  Hemiplegia and hemiparesis following cerebral infarction affecting left dominant side (HCC)  Other abnormalities of gait and mobility  Other lack of coordination     Problem List Patient Active Problem List   Diagnosis Date Noted  . Adhesive capsulitis of left shoulder 08/06/2017  . Urinary frequency 07/11/2017  . Gait disturbance, post-stroke 05/31/2017  . Left spastic hemiparesis ([place]) 05/31/2017  . Adjustment disorder with mixed anxiety and depressed mood   . Hypokalemia   . HCAP (healthcare-associated pneumonia)   . Cough with fever   . 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 FUO (fever of unknown origin)   . Oral thrush   . Benign essential HTN   . Rheumatoid arthritis involving both hands ([place]) 04/28/2017  . 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[place]) 04/27/2017  . CVA (cerebral vascular accident) ([place]) 04/24/2017  . LGSIL Pap smear of vagina 12/15/2016  . Anal skin tag 02/02/2016  . Inflammatory arthritis 01/19/2016  . Right knee pain 01/19/2016  . Genital herpes 10/20/2015  . GERD (gastroesophageal reflux disease) 10/20/2015  . Status post vaginal hysterectomy 10/20/2015  . Health care maintenance 05/30/2015  . LLQ pain 03/31/2014  . Menopausal symptoms 03/31/2014  . Hypercholesterolemia 12/31/2012  . Hypothyroidism 12/29/2012  . Osteoarthritis 12/29/2012  . PULMONARY NODULE 01/02/2008  . Mild depression ([place]) 12/30/2007  . Essential hypertension 12/30/2007  This entire session was performed under direct supervision and direction of a licensed therapist/therapist assistant . I have personally read, edited and approve of the note as written. Catalia Massett SPT  [place],  Sherryl Barters, PT DPT 08/19/2017, 1:52 PM  [place] MAIN [facility] SERVICES [address] [place], [place],  [address] Phone: [phone]   Fax:  [phone]  Name: Donna Rhodes MRN: [id number] Date of Birth: 07-16-1946

## 2017-08-19 NOTE — Patient Instructions (Addendum)
FLEXION: Sitting - Exercise Ball: Resistance Band (Active)    Sit, both feet flat. Against yellow resistance band, lift right knee toward ceiling. Complete ___ sets of ___ repetitions. Perform ___ sessions per day.  http://gtsc.exer.us/23   Copyright  VHI. All rights reserved.

## 2017-08-23 ENCOUNTER — Telehealth: Payer: Self-pay | Admitting: Diagnostic Neuroimaging

## 2017-08-23 ENCOUNTER — Encounter: Payer: Self-pay | Admitting: Occupational Therapy

## 2017-08-23 NOTE — Therapy (Signed)
[place] MAIN [facility] SERVICES [address] [place], [place], [address] Phone: [phone]   Fax:  [phone]  Occupational Therapy Treatment  Patient Details  Name: Donna Rhodes MRN: [id number] Date of Birth: 08/06/1946 Referring Provider: Dr. Letta Pate  Encounter Date: 08/19/2017      OT End of Session - 08/23/17 1023    Visit Number 9   Number of Visits 24   Date for OT Re-Evaluation 10/13/17   Authorization Type Medicare G Code 9 of 10   OT Start Time 1104   OT Stop Time 1146   OT Time Calculation (min) 42 min   Activity Tolerance Patient tolerated treatment well   Behavior During Therapy [facility] for tasks assessed/performed;Flat affect      Past Medical History:  Diagnosis Date  . Atrophic vaginitis   . Dysplasia of vagina    vin 1 - vulva and vaginal cuff  . GERD (gastroesophageal reflux disease)   . HCV infection   . HSV (herpes simplex virus) infection   . Hypercholesterolemia   . Hypertension   . Hypothyroidism   . Lung nodule    stable  . Menopausal state   . Osteoarthritis    hand involvement, cervical disc disease  . Osteoporosis   . Pelvic pain in female   . Rheumatoid arthritis ([place])   . Stroke (cerebrum) ([place]) 04/24/2017  . Thyroid nodule   . Vaginal Pap smear, abnormal    lgsil    Past Surgical History:  Procedure Laterality Date  . ADENOIDECTOMY    . APPENDECTOMY  1965  . [place]   left  . CHOLECYSTECTOMY    . [place]  . laser vein surgery    . MANDIBLE RECONSTRUCTION  1992  . TOENAIL EXCISION  1998   several  . [place]  . VAGINAL HYSTERECTOMY  1983   als0- anterior/posterior colporrhaphy  . VAGINAL WOUND CLOSURE / REPAIR  1999, 2000  . VAGINAL WOUND CLOSURE / REPAIR      There were no vitals filed for this visit.      Subjective Assessment - 08/23/17 1020    Subjective  Patient reports her left arm is still hurting, 8/10 today with movement.  She  reports she is planning to get another shot soon in the shoulder for pain however she reports the pain is more in the arm towards bicep area.     Pertinent History Pt. is a 71 y.o. female who had a CVA on 04/24/2017. Pt. went to the ER when she started not feeling well, and having heart palpitations. Pt. reports having work up initially, however was sent home. Pt. reports later in the day she was unable to write with her left hand. Pt. returned to the ER, and was admitted with workup for a CVA. Pt. was admitted to [facility], and stayed for approximately 3 weeks. Pt. returned home alone, and  received home health services for the past 2 months. Pt. now presents for Outpatient OT services. Pt. resides home alone, and has support children who stop by daily, assist with shopping, transportation, and  ADL/IADL tasks as needed.    Patient Stated Goals To get back the way she was. (Independent)   Currently in Pain? Yes   Pain Score 8    Pain Location Arm   Pain Orientation Left   Pain Descriptors / Indicators Aching   Pain Type Chronic pain   Pain Onset  More than a month ago   Pain Frequency Intermittent   Aggravating Factors  when returning from flexion.         Moist heat to left shoulder for 5 minutes prior to session to decrease pain and increase motion.   Manual Therapy:   Pt. performed scapular mobilizations for scapular elevation, depression, abduction/adduction in sitting, and sidelying followed by AAROM for same movements.  Scapular mobs were performed to help normalize tone, and prepare UE for ROM and functional use.  Therapeutic Exercise:  Shoulder protraction, retraction and shoulder rolls performed for 2 sets of 10 reps each after shoulder mobilizations.      Pt. seen for PROM in all joint ranges of the LUE performed in supine to include: shoulder flexion, abduction, horizontal abduction, elbow flexion, extension, forearm supination, wrist extension, thumb abduction,  digit extension.  Pt. Performed AROM for elbow flexion, extension, forearm supination, wrist extension, and thumb abduction.  Place and hold shoulder at 90 degrees of flexion with cues and therapist assist.   Shoulder flexion to 90 degrees with guiding from therapist, cues for supination of forearm to hold left hand in neutral position. Instructed on positioning when sitting in elevation and ABD on pillows.   Patient to perform ROM exercises at home with promotion of elbow extension, finger extension and supination.                         OT Education - 08/23/17 1022    Education provided Yes   Education Details HEP with left arm, positioning to avoid contractures.   Person(s) Educated Patient   Methods Explanation;Demonstration;Verbal cues   Comprehension Verbal cues required;Returned demonstration;Verbalized understanding             OT Long Term Goals - 07/21/17 1411      OT LONG TERM GOAL #1   Title Pt. will tolerate increased shoulder PROM by 20 degrees with pain improved to under 3/10   Baseline Flexion: 91, Abduction: 67   Time 12   Period Weeks   Status New   Target Date 10/13/17     OT LONG TERM GOAL #2   Title Pt. will formulate a full fist to be able to stabilize objects with her Left hand while using her right hand.    Baseline Digit flexion to [facility]: 2nd digit: 4cm, 3rd: 5.5, 4th: 6, 5th: 4.   Time 12   Period Weeks   Status New   Target Date 10/13/17     OT LONG TERM GOAL #3   Title Pt. will be able to wipe face independently with her Left UE.   Baseline Pt. is unable   Time 12   Period Weeks   Status New     OT LONG TERM GOAL #4   Title Pt. will demonstrate independence with splint application.   Baseline Pt. is unable to donn splint    Time 12   Period Weeks   Status New   Target Date 10/13/17     OT LONG TERM GOAL #5   Title Pt. will initiate active grasp release with thumb to 2nd digit 100% of the time to be able to hold a  utensil in preparation for self-feeding.   Baseline Minimal thum movement.   Time 12   Period Weeks   Status New   Target Date 10/13/17     Long Term Additional Goals   Additional Long Term Goals Yes     OT  LONG TERM GOAL #6   Title Pt. will initiate wrist extension in preparation for functional reaching.   Baseline No active wrist extension.   Time 12   Period Weeks   Status New   Target Date 10/13/17               Plan - 08/23/17 1024    Clinical Impression Statement Patient's pain tends to run along the bicep tendon and may have some inflammation causing pain.  Moist heat to area prior to ROM and patient reports decreased pain, she may want to try ice at home too to see if this will help reduce pain and inflammation in the area.  Patient tends to keep her arm in a protective position with elbow flexion and close to her body which can also contribute to pain especially since she has tightness towards elbow extension and ER.  Continued education on positioning and how this can affect her shoulder, arm and neck pain as well as decreased motion.  She is still adamant that she wants a MRI of the arm.  Continue to work towards goals to increase use of left UE in daily tasks.    Rehab Potential Good   OT Frequency 2x / week   OT Duration 12 weeks   OT Treatment/Interventions Self-care/ADL training;DME and/or AE instruction;Energy conservation;Therapeutic exercise;Neuromuscular education;Moist Heat;Therapeutic activities;Cognitive remediation/compensation;Therapeutic exercises;Patient/family education   Consulted and Agree with Plan of Care Patient      Patient will benefit from skilled therapeutic intervention in order to improve the following deficits and impairments:  Decreased cognition, Difficulty walking, Decreased balance, Decreased strength, Decreased safety awareness, Decreased coordination, Impaired UE functional use, Impaired tone, Decreased range of motion, Decreased  endurance, Decreased activity tolerance, Impaired perceived functional ability, Pain  Visit Diagnosis: Muscle weakness (generalized)  Hemiplegia and hemiparesis following cerebral infarction affecting left dominant side (HCC)  Other lack of coordination    Problem List Patient Active Problem List   Diagnosis Date Noted  . Adhesive capsulitis of left shoulder 08/06/2017  . Urinary frequency 07/11/2017  . Gait disturbance, post-stroke 05/31/2017  . Left spastic hemiparesis ([place]) 05/31/2017  . Adjustment disorder with mixed anxiety and depressed mood   . Hypokalemia   . HCAP (healthcare-associated pneumonia)   . Cough with fever   . 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 FUO (fever of unknown origin)   . Oral thrush   . Benign essential HTN   . Rheumatoid arthritis involving both hands ([place]) 04/28/2017  . 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[place]) 04/27/2017  . CVA (cerebral vascular accident) ([place]) 04/24/2017  . LGSIL Pap smear of vagina 12/15/2016  . Anal skin tag 02/02/2016  . Inflammatory arthritis 01/19/2016  . Right knee pain 01/19/2016  . Genital herpes 10/20/2015  . GERD (gastroesophageal reflux disease) 10/20/2015  . Status post vaginal hysterectomy 10/20/2015  . Health care maintenance 05/30/2015  . LLQ pain 03/31/2014  . Menopausal symptoms 03/31/2014  . Hypercholesterolemia 12/31/2012  . Hypothyroidism 12/29/2012  . Osteoarthritis 12/29/2012  . PULMONARY NODULE 01/02/2008  . Mild depression ([place]) 12/30/2007  . Essential hypertension 12/30/2007   Rand Etchison T Tomasita Morrow, OTR/L, CLT  Mael Delap 08/23/2017, 10:32 AM  [place] MAIN [facility] SERVICES [address] [place], [place], [address] Phone: [phone]   Fax:  [phone]  Name: KARELLY DEWALT MRN: [id number] Date of Birth: Aug 28, 1946

## 2017-08-23 NOTE — Telephone Encounter (Addendum)
Otho Bellows, daughter,  on DPR who stated they have not gotten pt scheduled for heart monitor. She called  Dr Rayann Heman, cardiologist  [phone] and was told the order/referral was incomplete.  This RN called Dr Jackalyn [place] office, spoke with Becky Sax, heart monitor coordinator. She stated referral was entered as consult with Dr Rayann Heman, not an order for 30 day cardiac monitor. She stated an order for 30 day cardiac monitor must be placed then she will get the order processed.  Will route to Dr Leta Baptist to place order.

## 2017-08-23 NOTE — Telephone Encounter (Addendum)
Discussed with Dr Leta Baptist who stated patient is to have consultation with Dr Rayann Heman for second opinion. Called Dr Jackalyn [place] office, spoke with Lenna Sciara who scheduled patient for consultation with Dr Beckie Salts on 09/03/17. Patient to arrive at 2:15, see dr at 2:30 pm. Called patient's sister to inform. She stated there is a conflict. This RN advised she needs to call Dr Waylan Rocher office tomorrow to reschedule, gave her the office number. She verbalized understanding, appreciation.

## 2017-08-23 NOTE — Therapy (Signed)
[place] MAIN [facility] SERVICES [address] [place], [place], [address] Phone: [phone]   Fax:  [phone]  Occupational Therapy Treatment  Patient Details  Name: Donna Rhodes MRN: [id number] Date of Birth: 05/09/1946 Referring Provider: Dr. Letta Pate  Encounter Date: 08/17/2017      OT End of Session - 08/23/17 1006    Visit Number 8   Number of Visits 24   Date for OT Re-Evaluation 10/13/17   Authorization Type Medicare G Code 8 of 10   OT Start Time 1034   OT Stop Time 1115   OT Time Calculation (min) 41 min   Activity Tolerance Patient tolerated treatment well   Behavior During Therapy [facility] for tasks assessed/performed;Flat affect      Past Medical History:  Diagnosis Date  . Atrophic vaginitis   . Dysplasia of vagina    vin 1 - vulva and vaginal cuff  . GERD (gastroesophageal reflux disease)   . HCV infection   . HSV (herpes simplex virus) infection   . Hypercholesterolemia   . Hypertension   . Hypothyroidism   . Lung nodule    stable  . Menopausal state   . Osteoarthritis    hand involvement, cervical disc disease  . Osteoporosis   . Pelvic pain in female   . Rheumatoid arthritis ([place])   . 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 Thyroid nodule   . Vaginal Pap smear, abnormal    lgsil    Past Surgical History:  Procedure Laterality Date  . ADENOIDECTOMY    . APPENDECTOMY  1965  . [place]   left  . CHOLECYSTECTOMY    . [place]  . laser vein surgery    . MANDIBLE RECONSTRUCTION  1992  . TOENAIL EXCISION  1998   several  . [place]  . VAGINAL HYSTERECTOMY  1983   als0- anterior/posterior colporrhaphy  . VAGINAL WOUND CLOSURE / REPAIR  1999, 2000  . VAGINAL WOUND CLOSURE / REPAIR      There were no vitals filed for this visit.      Subjective Assessment - 08/22/17 0959    Subjective  Patient reports she has pain in her left shoulder and arm, around  biceps/deltoid area radiating down to the the arm at times.  Patient reports she has had xrays and has requested a MRI but doctor has not ordered one yet.    Pertinent History Pt. is a 71 y.o. female who had a CVA on 04/24/2017. Pt. went to the ER when she started not feeling well, and having heart palpitations. Pt. reports having work up initially, however was sent home. Pt. reports later in the day she was unable to write with her left hand. Pt. returned to the ER, and was admitted with workup for a CVA. Pt. was admitted to [facility], and stayed for approximately 3 weeks. Pt. returned home alone, and  received home health services for the past 2 months. Pt. now presents for Outpatient OT services. Pt. resides home alone, and has support children who stop by daily, assist with shopping, transportation, and  ADL/IADL tasks as needed.    Patient Stated Goals To get back the way she was. (Independent)   Currently in Pain? Yes   Pain Score 4    Pain Location Shoulder   Pain Orientation Left   Pain Descriptors / Indicators Aching   Pain Type Chronic pain   Pain Onset More  than a month ago   Pain Frequency Intermittent        Therapeutic Exercise:   Pt. seen for PROM in all joint ranges of the LUE performed in supine to include: shoulder flexion, abduction, horizontal abduction, elbow flexion, extension, forearm supination, wrist extension, thumb abduction, digit extension.  Pt. Performed AROM for elbow flexion, extension, forearm supination, wrist extension, and thumb abduction.  Place and hold shoulder at 90 degrees of flexion with cues and therapist assist.   Shoulder flexion to 90 degrees with guiding from therapist, cues for supination of forearm to hold left hand in neutral position.    Manual Therapy:   Pt. performed scapular mobilizations for scapular elevation, depression, abduction/adduction in sitting, and sidelying followed by AAROM for same movements.  Scapular mobs  were performed to help normalize tone, and prepare UE for ROM and functional use.                       OT Education - 08/22/17 1005    Education provided Yes   Education Details ROM, positioning   Person(s) Educated Patient   Methods Explanation;Demonstration;Verbal cues   Comprehension Verbal cues required;Returned demonstration;Verbalized understanding             OT Long Term Goals - 07/21/17 1411      OT LONG TERM GOAL #1   Title Pt. will tolerate increased shoulder PROM by 20 degrees with pain improved to under 3/10   Baseline Flexion: 91, Abduction: 67   Time 12   Period Weeks   Status New   Target Date 10/13/17     OT LONG TERM GOAL #2   Title Pt. will formulate a full fist to be able to stabilize objects with her Left hand while using her right hand.    Baseline Digit flexion to [facility]: 2nd digit: 4cm, 3rd: 5.5, 4th: 6, 5th: 4.   Time 12   Period Weeks   Status New   Target Date 10/13/17     OT LONG TERM GOAL #3   Title Pt. will be able to wipe face independently with her Left UE.   Baseline Pt. is unable   Time 12   Period Weeks   Status New     OT LONG TERM GOAL #4   Title Pt. will demonstrate independence with splint application.   Baseline Pt. is unable to donn splint    Time 12   Period Weeks   Status New   Target Date 10/13/17     OT LONG TERM GOAL #5   Title Pt. will initiate active grasp release with thumb to 2nd digit 100% of the time to be able to hold a utensil in preparation for self-feeding.   Baseline Minimal thum movement.   Time 12   Period Weeks   Status New   Target Date 10/13/17     Long Term Additional Goals   Additional Long Term Goals Yes     OT LONG TERM GOAL #6   Title Pt. will initiate wrist extension in preparation for functional reaching.   Baseline No active wrist extension.   Time 12   Period Weeks   Status New   Target Date 10/13/17               Plan - 08/22/17 1006    Clinical  Impression Statement Patient is highly focused on pain in her left arm and reports on multiple occasions she feels she needs a MRI.  Discussed patterns of movement and how patient is continually holding her left arm in a protective position and not extending the arm to allow for full range of motion.  She is also complaining of some neck tightness and upper trap which is likely due to positioning as well and overactivation this muscle. Continue to work towards improving range of motion, decreasing pain and increasing functional use of left UE during daily tasks.    Rehab Potential Good   OT Frequency 2x / week   OT Duration 12 weeks   OT Treatment/Interventions Self-care/ADL training;DME and/or AE instruction;Energy conservation;Therapeutic exercise;Neuromuscular education;Moist Heat;Therapeutic activities;Cognitive remediation/compensation;Therapeutic exercises;Patient/family education   Consulted and Agree with Plan of Care Patient      Patient will benefit from skilled therapeutic intervention in order to improve the following deficits and impairments:  Decreased cognition, Difficulty walking, Decreased balance, Decreased strength, Decreased safety awareness, Decreased coordination, Impaired UE functional use, Impaired tone, Decreased range of motion, Decreased endurance, Decreased activity tolerance, Impaired perceived functional ability, Pain  Visit Diagnosis: Muscle weakness (generalized)  Hemiplegia and hemiparesis following cerebral infarction affecting left dominant side (HCC)  Other lack of coordination    Problem List Patient Active Problem List   Diagnosis Date Noted  . Adhesive capsulitis of left shoulder 08/06/2017  . Urinary frequency 07/11/2017  . Gait disturbance, post-stroke 05/31/2017  . Left spastic hemiparesis ([place]) 05/31/2017  . Adjustment disorder with mixed anxiety and depressed mood   . Hypokalemia   . HCAP (healthcare-associated pneumonia)   . Cough with fever    . 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 FUO (fever of unknown origin)   . Oral thrush   . Benign essential HTN   . Rheumatoid arthritis involving both hands ([place]) 04/28/2017  . 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[place]) 04/27/2017  . CVA (cerebral vascular accident) ([place]) 04/24/2017  . LGSIL Pap smear of vagina 12/15/2016  . Anal skin tag 02/02/2016  . Inflammatory arthritis 01/19/2016  . Right knee pain 01/19/2016  . Genital herpes 10/20/2015  . GERD (gastroesophageal reflux disease) 10/20/2015  . Status post vaginal hysterectomy 10/20/2015  . Health care maintenance 05/30/2015  . LLQ pain 03/31/2014  . Menopausal symptoms 03/31/2014  . Hypercholesterolemia 12/31/2012  . Hypothyroidism 12/29/2012  . Osteoarthritis 12/29/2012  . PULMONARY NODULE 01/02/2008  . Mild depression ([place]) 12/30/2007  . Essential hypertension 12/30/2007   Donna Rhodes, OTR/L, CLT  Jerilyn Gillaspie 08/23/2017, 10:11 AM  [place] MAIN [facility] SERVICES [address] [place], [place], [address] Phone: [phone]   Fax:  [phone]  Name: SARAIAH BHAT MRN: [id number] Date of Birth: 1946/07/31

## 2017-08-23 NOTE — Telephone Encounter (Signed)
Donna Rhodes called office in reference to referral to Dr. Jackalyn Lombard office, their office is stating the referral/order is not complete.  Please call Donna Rhodes.

## 2017-08-24 ENCOUNTER — Encounter: Payer: Self-pay | Admitting: Physical Therapy

## 2017-08-24 ENCOUNTER — Ambulatory Visit: Payer: PPO | Admitting: Occupational Therapy

## 2017-08-24 ENCOUNTER — Ambulatory Visit: Payer: PPO | Admitting: Physical Therapy

## 2017-08-24 DIAGNOSIS — R278 Other lack of coordination: Secondary | ICD-10-CM

## 2017-08-24 DIAGNOSIS — M6281 Muscle weakness (generalized): Secondary | ICD-10-CM

## 2017-08-24 DIAGNOSIS — I69352 Hemiplegia and hemiparesis following cerebral infarction affecting left dominant side: Secondary | ICD-10-CM

## 2017-08-24 NOTE — Therapy (Signed)
[place] MAIN [facility] SERVICES [address] [place], [place], [address] Phone: [phone]   Fax:  [phone]  Occupational Therapy Treatment  Patient Details  Name: NNENNA MEADOR MRN: [id number] Date of Birth: [birth year] Referring Provider: Dr. Letta Pate  Encounter Date: 08/24/2017      OT End of Session - 08/24/17 1156    Visit Number 10   Number of Visits 24   Date for OT Re-Evaluation 10/13/17   Authorization Type Medicare G Code 10 of 10   OT Start Time 1102   OT Stop Time 1145   OT Time Calculation (min) 43 min   Activity Tolerance Patient tolerated treatment well   Behavior During Therapy [facility] for tasks assessed/performed;Flat affect      Past Medical History:  Diagnosis Date  . Atrophic vaginitis   . Dysplasia of vagina    vin 1 - vulva and vaginal cuff  . GERD (gastroesophageal reflux disease)   . HCV infection   . HSV (herpes simplex virus) infection   . Hypercholesterolemia   . Hypertension   . Hypothyroidism   . Lung nodule    stable  . Menopausal state   . Osteoarthritis    hand involvement, cervical disc disease  . Osteoporosis   . Pelvic pain in female   . Rheumatoid arthritis ([place])   . Stroke (cerebrum) ([place]) 04/24/2017  . Thyroid nodule   . Vaginal Pap smear, abnormal    lgsil    Past Surgical History:  Procedure Laterality Date  . ADENOIDECTOMY    . APPENDECTOMY  1965  . [place]   left  . CHOLECYSTECTOMY    . [place]  . laser vein surgery    . MANDIBLE RECONSTRUCTION  1992  . TOENAIL EXCISION  1998   several  . [place]  . VAGINAL HYSTERECTOMY  1983   als0- anterior/posterior colporrhaphy  . VAGINAL WOUND CLOSURE / REPAIR  1999, 2000  . VAGINAL WOUND CLOSURE / REPAIR      There were no vitals filed for this visit.      Subjective Assessment - 08/24/17 1153    Subjective  Pt. plans to get another shot in her arm soon.   Patient is accompained by:  Family member   Pertinent History Pt. is a 71 y.o. female who had a CVA on 04/24/2017. Pt. went to the ER when she started not feeling well, and having heart palpitations. Pt. reports having work up initially, however was sent home. Pt. reports later in the day she was unable to write with her left hand. Pt. returned to the ER, and was admitted with workup for a CVA. Pt. was admitted to [facility], and stayed for approximately 3 weeks. Pt. returned home alone, and  received home health services for the past 2 months. Pt. now presents for Outpatient OT services. Pt. resides home alone, and has support children who stop by daily, assist with shopping, transportation, and  ADL/IADL tasks as needed.    Currently in Pain? No/denies   Pain Score 8    Pain Location Arm   Pain Orientation Left       OT TREATMENT    Therapeutic Exercise:  Pt. tolerated PROM in all joint ranges of the LUE including: shoulder flexion, abduction, horizontal abduction, elbow flexion, extension, forearm supination, wrist extension, thumb abduction, digit extension. Pt. Performed AROM for elbow flexion, extension, forearm supination. Pt. worked on hand to  face patterns with her right hand in preparation for light grooming. Pt. Tolerated moist heat modality to the left shoulder during ROM to the wrist, and digits. Heat was provided prior to ROM to the shoulder.  Manual Therapy:  Pt. performed scapular mobilizations for scapular elevation, depression, abduction/adduction in sitting. Scapular mobes were performed to help normalize tone, and prepare UE for ROM and functional use.                          OT Education - 08/24/17 1155    Education provided Yes   Education Details [facility] skills.   Person(s) Educated Patient   Methods Explanation;Demonstration;Verbal cues   Comprehension Verbalized understanding;Returned demonstration;Verbal cues required             OT Long Term  Goals - 08/24/17 1209      OT LONG TERM GOAL #1   Title Pt. will tolerate increased shoulder PROM by 20 degrees with pain improved to under 3/10   Baseline Flexion: 91, Abduction: 67   Time 12   Period Weeks   Status On-going   Target Date 10/13/17     OT LONG TERM GOAL #2   Title Pt. will formulate a full fist to be able to stabilize objects with her Left hand while using her right hand.    Baseline Digit flexion to [facility]: 2nd digit: 4cm, 3rd: 5.5, 4th: 6, 5th: 4.   Time 12   Period Weeks   Status On-going   Target Date 10/13/17     OT LONG TERM GOAL #3   Title Pt. will be able to wipe face independently with her Left UE.   Baseline Pt. is unable   Time 12   Period Weeks   Status On-going   Target Date 10/13/17     OT LONG TERM GOAL #4   Title Pt. will demonstrate independence with splint application.   Baseline Pt. is unable to donn splint    Time 12   Period Weeks   Status New   Target Date 10/13/17     OT LONG TERM GOAL #5   Title Pt. will initiate active grasp release with thumb to 2nd digit 100% of the time to be able to hold a utensil in preparation for self-feeding.   Baseline Minimal thumb movement.   Time 12   Period Weeks   Status On-going   Target Date 10/13/17     OT LONG TERM GOAL #6   Title Pt. will initiate wrist extension in preparation for functional reaching.   Baseline No active wrist extension.   Time 12   Period Weeks   Status On-going   Target Date 10/13/17               Plan - 08/24/17 1156    Clinical Impression Statement Pt. continues to present with pain throughout her left upper arm and shoulder. Pt.  keeps her are positioined across her trunk when walking. Pt. could benefit from a left shoulder brace. Pt. education was provided about the importance of performing ROM exercises at home. Pt. Goals were reviewed.   Occupational performance deficits (Please refer to evaluation for details): ADL's;IADL's   Rehab Potential Good   OT  Frequency 2x / week   OT Treatment/Interventions Self-care/ADL training;DME and/or AE instruction;Energy conservation;Therapeutic exercise;Neuromuscular education;Moist Heat;Therapeutic activities;Cognitive remediation/compensation;Therapeutic exercises;Patient/family education   Consulted and Agree with Plan of Care Patient      Patient will benefit from  skilled therapeutic intervention in order to improve the following deficits and impairments:  Decreased cognition, Difficulty walking, Decreased balance, Decreased strength, Decreased safety awareness, Decreased coordination, Impaired UE functional use, Impaired tone, Decreased range of motion, Decreased endurance, Decreased activity tolerance, Impaired perceived functional ability, Pain  Visit Diagnosis: Muscle weakness (generalized)      G-Codes - 2017-09-18 1206/04/19    Functional Assessment Tool Used (Outpatient only) clinical judgement based on pt. current functional level, MMT, ROM   Functional Limitation Carrying, moving and handling objects;Self care   Carrying, Moving and Handling Objects Current Status [phone]) At least 83 percent but less than 100 percent impaired, limited or restricted   Carrying, Moving and Handling Objects Goal Status (B9390) At least 40 percent but less than 60 percent impaired, limited or restricted   Self Care Current Status (Z0092) At least 20 percent but less than 40 percent impaired, limited or restricted   Self Care Goal Status (Z3007) At least 1 percent but less than 20 percent impaired, limited or restricted      Problem List Patient Active Problem List   Diagnosis Date Noted  . Adhesive capsulitis of left shoulder 08/06/2017  . Urinary frequency 07/11/2017  . Gait disturbance, post-stroke 05/31/2017  . Left spastic hemiparesis ([place]) 05/31/2017  . Adjustment disorder with mixed anxiety and depressed mood   . Hypokalemia   . HCAP (healthcare-associated pneumonia)   . Cough with fever   . Flaccid  monoplegia of upper extremity ([place])   . FUO (fever of unknown origin)   . Oral thrush   . Benign essential HTN   . Rheumatoid arthritis involving both hands ([place]) 04/28/2017  . 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[place]) 04/27/2017  . CVA (cerebral vascular accident) ([place]) 04/24/2017  . LGSIL Pap smear of vagina 12/15/2016  . Anal skin tag 02/02/2016  . Inflammatory arthritis 01/19/2016  . Right knee pain 01/19/2016  . Genital herpes 10/20/2015  . GERD (gastroesophageal reflux disease) 10/20/2015  . Status post vaginal hysterectomy 10/20/2015  . Health care maintenance 05/30/2015  . LLQ pain 03/31/2014  . Menopausal symptoms 03/31/2014  . Hypercholesterolemia 12/31/2012  . Hypothyroidism 12/29/2012  . Osteoarthritis 12/29/2012  . PULMONARY NODULE 01/02/2008  . Mild depression ([place]) 12/30/2007  . Essential hypertension 12/30/2007    Harrel Carina, MS, OTR/L 2017-09-18, 12:11 PM  [place] MAIN [facility] SERVICES [address] [place], [place], [address] Phone: [phone]   Fax:  [phone]  Name: PIERRA SKORA MRN: [id number] Date of Birth: [birth year]

## 2017-08-24 NOTE — Therapy (Signed)
[place] MAIN [facility] SERVICES [address] [place], [place], [address] Phone: [phone]   Fax:  [phone]  Physical Therapy Treatment/ Progress note  Patient Details  Name: Donna Rhodes MRN: [id number] Date of Birth: 1946/12/06 Referring Provider: Alysia Penna  Encounter Date: 08/24/2017      PT End of Session - 08/24/17 1251    Visit Number 9   Number of Visits 17   Date for PT Re-Evaluation 09/15/17   Authorization Type gcode 9   Authorization Time Period 10   PT Start Time 1016   PT Stop Time 1057   PT Time Calculation (min) 41 min   Equipment Utilized During Treatment Gait belt   Activity Tolerance Patient tolerated treatment well;No increased pain   Behavior During Therapy [facility] for tasks assessed/performed;Flat affect      Past Medical History:  Diagnosis Date  . Atrophic vaginitis   . Dysplasia of vagina    vin 1 - vulva and vaginal cuff  . GERD (gastroesophageal reflux disease)   . HCV infection   . HSV (herpes simplex virus) infection   . Hypercholesterolemia   . Hypertension   . Hypothyroidism   . Lung nodule    stable  . Menopausal state   . Osteoarthritis    hand involvement, cervical disc disease  . Osteoporosis   . Pelvic pain in female   . Rheumatoid arthritis ([place])   . Stroke (cerebrum) ([place]) 04/24/2017  . Thyroid nodule   . Vaginal Pap smear, abnormal    lgsil    Past Surgical History:  Procedure Laterality Date  . ADENOIDECTOMY    . APPENDECTOMY  1965  . [place]   left  . CHOLECYSTECTOMY    . [place]  . laser vein surgery    . MANDIBLE RECONSTRUCTION  1992  . TOENAIL EXCISION  1998   several  . [place]  . VAGINAL HYSTERECTOMY  1983   als0- anterior/posterior colporrhaphy  . VAGINAL WOUND CLOSURE / REPAIR  1999, 2000  . VAGINAL WOUND CLOSURE / REPAIR      There were no vitals filed for this visit.      Subjective Assessment - 08/24/17  1020    Subjective Pt reports she did not sleep last night due to having "difficulty getting my arm fixed" pt reports no pain at this time, but she has had intermittent pain in the L shoulder elbow and hand    Patient is accompained by: Family member   Pertinent History Pt is a 71 y.o female that presents to therapy s/p CVA; On April 24, 2017 pt suffered an acute infarct in right basal ganglia. She was in hospital 3 days; after pt was transferred to [facility] where she did inpatient therapy; Pt then completed home PT until about 2 weeks ago; Pt has pain in shoulder when moving it with R UE and reports that deltoid is paralyzed; Pt fell out of bed one day and fell on L UE; Pt reported doctor put a shot in L shoulder and will receive 3 more shots in shoulder; Pt reports no sensation or numbness and tingling with LE just some sensation impaired in L hand; Pt has an air cast for L LE and splint for wrist but does not use because difficulty donning;  Pt has had trouble sleeping at night with waking and going to the bathroom but denies any pain;  Only has pain in shoulder when  moving described as throbbing; Pt reports not using her cane in the last month and states her balance is fine without it;    Limitations Lifting;Standing;Walking   How long can you sit comfortably? hours    How long can you stand comfortably? 30 min    How long can you walk comfortably? better in the morning because more energy;    Diagnostic tests x rays in shoulder and hand; all clear; doctor stated "inferior pseudo-subluxation from paralysis of the deltoid. she has advanced left hand arthritis"   Patient Stated Goals walking better without the cane;    Currently in Pain? No/denies   Pain Onset More than a month ago       Treatment:  Goals re-assessed:   5 times sit to stand: 11.65 sec, without UE support indicating pt is low fall risk for age > 21; improved from 18s on 07/21/17   33m walk test 1.61m/s without AD indicating pt  is capable of a community ambulator gait speed and is a low fall risk; improved from 0.33m/s on 07/21/17  6 min walk test: 875' with no AD and SBA for safety; this is significantly improved from last test on 07/21/17 when pt walked only 565'. Current 6 min walk distance is 53% of norm value for pt's age.  Pt ambulates with mild crouch gait with decreased knee extension in heel strike and stance. This causes a midfoot strike on the R and forefoot strike on the L. Pt also scissors slightly on left with mild inversion, likely due to left hip abductor weakness.   Ther-ex:  Seated:   Leg press:     BLE  90# x 12 reps; pt reports this is not too difficult. MinA for foot placement. Cues to avoid full knee extension with concentric motion.   BLE:  90# 1 x 15 with red t-band added around knees to challenge knee control; to tolerated band well with no increase in genu valgus    L LE only  45# 1 x 15 reps Pt reports weight is not difficult     50# x 12 reps required cues for better knee control    Standing in parallel bars:   Zig-zag walk 4 x 10' with red t-band around ankles for hip flexor and abductor functional strengthening. Pt cued for foot placement and for increased eccentric control.           PT Long Term Goals - 08/24/17 1320      PT LONG TERM GOAL #1   Title Pt will become independent with HEP in order to improve ADL independence;   Baseline Pt does her HEP everyday   Time 8   Period Weeks   Status On-going   Target Date 09/15/17     PT LONG TERM GOAL #2   Title Pt will decrease 5 times sit to stand to less than 15 sec with no UE use to decrease risk of fall;   Baseline 18 sec, improve to 11.65 sec indicating pt is low fall risk for age > 74 on 8/28   Time 8   Period Weeks   Status Achieved   Target Date 09/15/17     PT LONG TERM GOAL #3   Title Pt will improve gait speed in 10MWT to .8-1.75m/s in order to be considered a comunity ambulator;   Baseline .57m/s improved to  1.48m/s 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 on 8/28   Time 8   Period  Weeks   Status Achieved   Target Date 09/15/17     PT LONG TERM GOAL #4   Title Pt will improve 6MWT to 1000 feet in order to improve activity tolerance and community ambulation distance for improved IADLs (revised from 800")   Baseline 565 feet, improved to 875' on 8/28   Time 8   Period Weeks   Status Revised   Target Date 09/15/17     PT LONG TERM GOAL #5   Title Pt will increase strength in L LE to 4+/5 for all muscles in order to improve mobility and function with daily activities;    Baseline Functionally limited    Time 8   Period Weeks   Status On-going   Target Date 09/15/17               Plan - 08/24/17 1307    Clinical Impression Statement Pt's goals assessed today with pt showing improvement in all goals assessed. Pt's gait speed and 5x sit<>stand both indicate that she is a low fall risk for her age and at a community ambulator level based on gait speed. Therapist discussed goals and progress with pt. Pt's 3m walk test improved significantly, though she is still below community ambulation distance and at 53% of the norm distance for her age. Pt expressed continued concern about her LLE crossing midline at heel strike during gait and her foot inversion in swing. Pt was assured that current plan of care has been designed to address these issues through strengthening and neuro re-education. Pt will benefit from continued skilled therapy to address her residual deficits and to maximize her functional mobility and safety.    Rehab Potential Fair   Clinical Impairments Affecting Rehab Potential neg: age, lives alone, comorbidities, pos: wants to be more independent; already high functioning    PT Frequency 2x / week   PT Duration 8 weeks   PT Treatment/Interventions ADLs/Self Care Home Management;Aquatic Therapy;Moist Heat;Cryotherapy;DME Instruction;Gait training;Stair  training;Functional mobility training;Therapeutic activities;Therapeutic exercise;Balance training;Neuromuscular re-education;Manual techniques;Passive range of motion;Energy conservation;Electrical Stimulation;Patient/family education;Orthotic Fit/Training   PT Next Visit Plan berg; stairs; strengthening, Assess adductor tightness   PT Home Exercise Plan continue as given;    Consulted and Agree with Plan of Care Patient      Patient will benefit from skilled therapeutic intervention in order to improve the following deficits and impairments:  Abnormal gait, Impaired sensation, Decreased mobility, Decreased activity tolerance, Decreased endurance, Decreased range of motion, Decreased strength, Impaired UE functional use, Decreased balance, Decreased safety awareness, Difficulty walking, Pain  Visit Diagnosis: Muscle weakness (generalized)  Hemiplegia and hemiparesis following cerebral infarction affecting left dominant side (HCC)  Other lack of coordination     Problem List Patient Active Problem List   Diagnosis Date Noted  . Adhesive capsulitis of left shoulder 08/06/2017  . Urinary frequency 07/11/2017  . Gait disturbance, post-stroke 05/31/2017  . Left spastic hemiparesis ([place]) 05/31/2017  . Adjustment disorder with mixed anxiety and depressed mood   . Hypokalemia   . HCAP (healthcare-associated pneumonia)   . Cough with fever   . 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 FUO (fever of unknown origin)   . Oral thrush   . Benign essential HTN   . Rheumatoid arthritis involving both hands ([place]) 04/28/2017  . 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[place]) 04/27/2017  . CVA (cerebral vascular accident) ([place]) 04/24/2017  . LGSIL Pap smear of vagina 12/15/2016  . Anal skin tag 02/02/2016  . Inflammatory arthritis 01/19/2016  .  Right knee pain 01/19/2016  . Genital herpes 10/20/2015  . GERD (gastroesophageal reflux disease) 10/20/2015  . Status post vaginal hysterectomy  10/20/2015  . Health care maintenance 05/30/2015  . LLQ pain 03/31/2014  . Menopausal symptoms 03/31/2014  . Hypercholesterolemia 12/31/2012  . Hypothyroidism 12/29/2012  . Osteoarthritis 12/29/2012  . PULMONARY NODULE 01/02/2008  . Mild depression ([place]) 12/30/2007  . Essential hypertension 12/30/2007   Burnett Corrente, SPT This entire session was performed under direct supervision and direction of a licensed therapist/therapist assistant . I have personally read, edited and approve of the note as written.  Trotter,Margaret PT, DPT 08/24/2017, 4:35 PM  [place] MAIN [facility] SERVICES [address] [place], [place], [address] Phone: [phone]   Fax:  [phone]  Name: Donna Rhodes MRN: [id number] Date of Birth: August 21, 1946

## 2017-08-26 ENCOUNTER — Encounter: Payer: Self-pay | Admitting: Physical Therapy

## 2017-08-26 ENCOUNTER — Ambulatory Visit: Payer: PPO | Admitting: Occupational Therapy

## 2017-08-26 ENCOUNTER — Ambulatory Visit: Payer: PPO | Admitting: Physical Therapy

## 2017-08-26 DIAGNOSIS — M6281 Muscle weakness (generalized): Secondary | ICD-10-CM | POA: Diagnosis not present

## 2017-08-26 DIAGNOSIS — R2689 Other abnormalities of gait and mobility: Secondary | ICD-10-CM

## 2017-08-26 DIAGNOSIS — I69352 Hemiplegia and hemiparesis following cerebral infarction affecting left dominant side: Secondary | ICD-10-CM

## 2017-08-26 DIAGNOSIS — R278 Other lack of coordination: Secondary | ICD-10-CM

## 2017-08-26 DIAGNOSIS — I6381 Other cerebral infarction due to occlusion or stenosis of small artery: Secondary | ICD-10-CM

## 2017-08-26 NOTE — Therapy (Signed)
[place] MAIN [facility] SERVICES [address] [place], [place], [address] Phone: [phone]   Fax:  [phone]  Occupational Therapy Treatment  Patient Details  Name: Donna Rhodes MRN: [id number] Date of Birth: [birth year] Referring Provider: Dr. Letta Pate  Encounter Date: 08/26/2017      OT End of Session - 08/26/17 1336    Visit Number 11   Number of Visits 24   Date for OT Re-Evaluation 10/13/17   Authorization Type Medicare G Code 1 of 10   OT Start Time 1100   OT Stop Time 1145   OT Time Calculation (min) 45 min   Activity Tolerance Patient tolerated treatment well   Behavior During Therapy [facility] for tasks assessed/performed;Flat affect      Past Medical History:  Diagnosis Date  . Atrophic vaginitis   . Dysplasia of vagina    vin 1 - vulva and vaginal cuff  . GERD (gastroesophageal reflux disease)   . HCV infection   . HSV (herpes simplex virus) infection   . Hypercholesterolemia   . Hypertension   . Hypothyroidism   . Lung nodule    stable  . Menopausal state   . Osteoarthritis    hand involvement, cervical disc disease  . Osteoporosis   . Pelvic pain in female   . Rheumatoid arthritis ([place])   . Stroke (cerebrum) ([place]) 04/24/2017  . Thyroid nodule   . Vaginal Pap smear, abnormal    lgsil    Past Surgical History:  Procedure Laterality Date  . ADENOIDECTOMY    . APPENDECTOMY  1965  . [place]   left  . CHOLECYSTECTOMY    . [place]  . laser vein surgery    . MANDIBLE RECONSTRUCTION  1992  . TOENAIL EXCISION  1998   several  . [place]  . VAGINAL HYSTERECTOMY  1983   als0- anterior/posterior colporrhaphy  . VAGINAL WOUND CLOSURE / REPAIR  1999, 2000  . VAGINAL WOUND CLOSURE / REPAIR      There were no vitals filed for this visit.      Subjective Assessment - 08/26/17 1334    Subjective  Pt. reports she will get another shot in her left arm on Sptember 10th.    Patient is accompained by: Family member   Pertinent History Pt. is a 71 y.o. female who had a CVA on 04/24/2017. Pt. went to the ER when she started not feeling well, and having heart palpitations. Pt. reports having work up initially, however was sent home. Pt. reports later in the day she was unable to write with her left hand. Pt. returned to the ER, and was admitted with workup for a CVA. Pt. was admitted to [facility], and stayed for approximately 3 weeks. Pt. returned home alone, and  received home health services for the past 2 months. Pt. now presents for Outpatient OT services. Pt. resides home alone, and has support children who stop by daily, assist with shopping, transportation, and  ADL/IADL tasks as needed.    Limitations Pt. is unable to use her dominant RUE during functional tasks.   Patient Stated Goals To get back the way she was. (Independent)   Currently in Pain? Yes   Pain Score 3    Pain Location Arm      OT TREATMENT       Therapeutic Exercise:  Pt. tolerated PROM in all joint ranges of the LUE including: shoulder flexion,  abduction, horizontal abduction, elbow flexion, extension, forearm supination, wrist extension, thumb abduction, digit extension. Pt. Performed AROM for elbow flexion, extension, forearm supination. Pt. worked on hand to face patterns with her right hand in preparation for light grooming. Pt. Was provided with education about home exercises.  Manual Therapy:  Pt. performed scapular mobilizations for scapular elevation, depression, abduction/adduction in sidelying, and sitting. Scapular mobes were performed to help normalize tone, and prepare UE for ROM and functional use.                              OT Education - 08/26/17 1335    Education provided Yes   Education Details LUE functioning.   Person(s) Educated Patient   Methods Explanation;Demonstration   Comprehension Verbalized  understanding;Returned demonstration             OT Long Term Goals - 08/24/17 1209      OT LONG TERM GOAL #1   Title Pt. will tolerate increased shoulder PROM by 20 degrees with pain improved to under 3/10   Baseline Flexion: 91, Abduction: 67   Time 12   Period Weeks   Status On-going   Target Date 10/13/17     OT LONG TERM GOAL #2   Title Pt. will formulate a full fist to be able to stabilize objects with her Left hand while using her right hand.    Baseline Digit flexion to [facility]: 2nd digit: 4cm, 3rd: 5.5, 4th: 6, 5th: 4.   Time 12   Period Weeks   Status On-going   Target Date 10/13/17     OT LONG TERM GOAL #3   Title Pt. will be able to wipe face independently with her Left UE.   Baseline Pt. is unable   Time 12   Period Weeks   Status On-going   Target Date 10/13/17     OT LONG TERM GOAL #4   Title Pt. will demonstrate independence with splint application.   Baseline Pt. is unable to donn splint    Time 12   Period Weeks   Status New   Target Date 10/13/17     OT LONG TERM GOAL #5   Title Pt. will initiate active grasp release with thumb to 2nd digit 100% of the time to be able to hold a utensil in preparation for self-feeding.   Baseline Minimal thumb movement.   Time 12   Period Weeks   Status On-going   Target Date 10/13/17     OT LONG TERM GOAL #6   Title Pt. will initiate wrist extension in preparation for functional reaching.   Baseline No active wrist extension.   Time 12   Period Weeks   Status On-going   Target Date 10/13/17               Plan - 08/26/17 1336    Clinical Impression Statement Pt. continues to present with intermittent pain throughout her LUE at the glenohumeral joint, deltoid, and bicep. Pt. education was provided about a shoulder harness brace for support at the left shoulder, secondary to subluxation. Pt. was provided with a picture of recommended brace, and information. Pt. reports she wanted to ask her physician.    Occupational performance deficits (Please refer to evaluation for details): ADL's;IADL's   Rehab Potential Good   OT Frequency 2x / week   OT Duration 12 weeks   OT Treatment/Interventions Self-care/ADL training;DME and/or AE instruction;Energy conservation;Therapeutic exercise;Neuromuscular  education;Moist Heat;Therapeutic activities;Cognitive remediation/compensation;Therapeutic exercises;Patient/family education   Consulted and Agree with Plan of Care Patient      Patient will benefit from skilled therapeutic intervention in order to improve the following deficits and impairments:  Decreased cognition, Difficulty walking, Decreased balance, Decreased strength, Decreased safety awareness, Decreased coordination, Impaired UE functional use, Impaired tone, Decreased range of motion, Decreased endurance, Decreased activity tolerance, Impaired perceived functional ability, Pain  Visit Diagnosis: Muscle weakness (generalized)    Problem List Patient Active Problem List   Diagnosis Date Noted  . Adhesive capsulitis of left shoulder 08/06/2017  . Urinary frequency 07/11/2017  . Gait disturbance, post-stroke 05/31/2017  . Left spastic hemiparesis ([place]) 05/31/2017  . Adjustment disorder with mixed anxiety and depressed mood   . Hypokalemia   . HCAP (healthcare-associated pneumonia)   . Cough with fever   . 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 FUO (fever of unknown origin)   . Oral thrush   . Benign essential HTN   . Rheumatoid arthritis involving both hands ([place]) 04/28/2017  . 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[place]) 04/27/2017  . CVA (cerebral vascular accident) ([place]) 04/24/2017  . LGSIL Pap smear of vagina 12/15/2016  . Anal skin tag 02/02/2016  . Inflammatory arthritis 01/19/2016  . Right knee pain 01/19/2016  . Genital herpes 10/20/2015  . GERD (gastroesophageal reflux disease) 10/20/2015  . Status post vaginal hysterectomy 10/20/2015  . Health care maintenance  05/30/2015  . LLQ pain 03/31/2014  . Menopausal symptoms 03/31/2014  . Hypercholesterolemia 12/31/2012  . Hypothyroidism 12/29/2012  . Osteoarthritis 12/29/2012  . PULMONARY NODULE 01/02/2008  . Mild depression ([place]) 12/30/2007  . Essential hypertension 12/30/2007    Harrel Carina 08/26/2017, 1:46 PM  [place] MAIN [facility] SERVICES [address] [place], [place], [address] Phone: [phone]   Fax:  [phone]  Name: Donna Rhodes MRN: [id number] Date of Birth: 07/31/1946

## 2017-08-26 NOTE — Therapy (Deleted)
[place] MAIN [facility] SERVICES [address] [place], [place], [address] Phone: [phone]   Fax:  [phone]  Physical Therapy Treatment  Patient Details  Name: Donna Rhodes MRN: [id number] Date of Birth: [birth year] Referring Provider: Alysia Penna  Encounter Date: 08/26/2017      PT End of Session - 08/26/17 1201    Visit Number 10   Number of Visits 17   Date for PT Re-Evaluation 09/15/17   Authorization Type gcode 10   Authorization Time Period 10   PT Start Time 1155   PT Stop Time 1233   PT Time Calculation (min) 38 min   Equipment Utilized During Treatment Gait belt   Activity Tolerance Patient tolerated treatment well;No increased pain   Behavior During Therapy [facility] for tasks assessed/performed;Flat affect      Past Medical History:  Diagnosis Date  . Atrophic vaginitis   . Dysplasia of vagina    vin 1 - vulva and vaginal cuff  . GERD (gastroesophageal reflux disease)   . HCV infection   . HSV (herpes simplex virus) infection   . Hypercholesterolemia   . Hypertension   . Hypothyroidism   . Lung nodule    stable  . Menopausal state   . Osteoarthritis    hand involvement, cervical disc disease  . Osteoporosis   . Pelvic pain in female   . Rheumatoid arthritis ([place])   . 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 Thyroid nodule   . Vaginal Pap smear, abnormal    lgsil    Past Surgical History:  Procedure Laterality Date  . ADENOIDECTOMY    . APPENDECTOMY  1965  . [place]   left  . CHOLECYSTECTOMY    . [place]  . laser vein surgery    . MANDIBLE RECONSTRUCTION  1992  . TOENAIL EXCISION  1998   several  . [place]  . VAGINAL HYSTERECTOMY  1983   als0- anterior/posterior colporrhaphy  . VAGINAL WOUND CLOSURE / REPAIR  1999, 2000  . VAGINAL WOUND CLOSURE / REPAIR      There were no vitals filed for this visit.      Subjective Assessment - 08/26/17 1200     Subjective Patient reports that she is having some left shoulder pain that is intermittent and right now it does not hurt.    Patient is accompained by: Family member   Pertinent History Pt is a 71 y.o female that presents to therapy s/p CVA; On April 24, 2017 pt suffered an acute infarct in right basal ganglia. She was in hospital 3 days; after pt was transferred to [facility] where she did inpatient therapy; Pt then completed home PT until about 2 weeks ago; Pt has pain in shoulder when moving it with R UE and reports that deltoid is paralyzed; Pt fell out of bed one day and fell on L UE; Pt reported doctor put a shot in L shoulder and will receive 3 more shots in shoulder; Pt reports no sensation or numbness and tingling with LE just some sensation impaired in L hand; Pt has an air cast for L LE and splint for wrist but does not use because difficulty donning;  Pt has had trouble sleeping at night with waking and going to the bathroom but denies any pain;  Only has pain in shoulder when moving described as throbbing; Pt reports not using her cane in the last month and states her balance  is fine without it;    Limitations Lifting;Standing;Walking   How long can you sit comfortably? hours    How long can you stand comfortably? 30 min    How long can you walk comfortably? better in the morning because more energy;    Diagnostic tests x rays in shoulder and hand; all clear; doctor stated "inferior pseudo-subluxation from paralysis of the deltoid. she has advanced left hand arthritis"   Patient Stated Goals walking better without the cane;    Currently in Pain? No/denies   Pain Score 0-No pain   Pain Onset More than a month ago   Multiple Pain Sites No                                 PT Education - 08/26/17 1201    Education provided Yes   Education Details exerise technique   Person(s) Educated Patient   Methods Explanation;Demonstration   Comprehension Verbalized  understanding;Returned demonstration             PT Long Term Goals - 08/24/17 1320      PT LONG TERM GOAL #1   Title Pt will become independent with HEP in order to improve ADL independence;   Baseline Pt does her HEP everyday   Time 8   Period Weeks   Status On-going   Target Date 09/15/17     PT LONG TERM GOAL #2   Title Pt will decrease 5 times sit to stand to less than 15 sec with no UE use to decrease risk of fall;   Baseline 18 sec, improve to 11.65 sec indicating pt is low fall risk for age > 28 on 8/28   Time 8   Period Weeks   Status Achieved   Target Date 09/15/17     PT LONG TERM GOAL #3   Title Pt will improve gait speed in 10MWT to .8-1.74m/s in order to be considered a comunity ambulator;   Baseline .69m/s 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   Time 8   Period Weeks   Status Achieved   Target Date 09/15/17     PT LONG TERM GOAL #4   Title Pt will improve 6MWT to 1000 feet in order to improve activity tolerance and community ambulation distance for improved IADLs (revised from 800")   Baseline 565 feet, improved to 875' on 8/28   Time 8   Period Weeks   Status Revised   Target Date 09/15/17     PT LONG TERM GOAL #5   Title Pt will increase strength in L LE to 4+/5 for all muscles in order to improve mobility and function with daily activities;    Baseline Functionally limited    Time 8   Period Weeks   Status On-going   Target Date 09/15/17               Plan - 08/26/17 1651    Clinical Impression Statement Patient performs therapeutic exercise and balance exercises in standing and has difficulty with motor control of LLE during step down exercise with left ankle inversion and hip adduction causing LLE to cross midline. Patient is aware and is unble to self correct especially with fatigue. Patient has flexed posture during standing balance exercises and will continue to benefit from skilled PT to  improve strength and quality of life.    Rehab Potential  Fair   Clinical Impairments Affecting Rehab Potential neg: age, lives alone, comorbidities, pos: wants to be more independent; already high functioning    PT Frequency 2x / week   PT Duration 8 weeks   PT Treatment/Interventions ADLs/Self Care Home Management;Aquatic Therapy;Moist Heat;Cryotherapy;DME Instruction;Gait training;Stair training;Functional mobility training;Therapeutic activities;Therapeutic exercise;Balance training;Neuromuscular re-education;Manual techniques;Passive range of motion;Energy conservation;Electrical Stimulation;Patient/family education;Orthotic Fit/Training   PT Next Visit Plan berg; stairs; strengthening, Assess adductor tightness   PT Home Exercise Plan continue as given;    Consulted and Agree with Plan of Care Patient      Patient will benefit from skilled therapeutic intervention in order to improve the following deficits and impairments:  Abnormal gait, Impaired sensation, Decreased mobility, Decreased activity tolerance, Decreased endurance, Decreased range of motion, Decreased strength, Impaired UE functional use, Decreased balance, Decreased safety awareness, Difficulty walking, Pain  Visit Diagnosis: Muscle weakness (generalized)  Hemiplegia and hemiparesis following cerebral infarction affecting left dominant side (HCC)  Other lack of coordination  Other abnormalities of gait and mobility  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HCC)       G-Codes - 09/16/2017 1310    Functional Assessment Tool Used (Outpatient Only) 5 times sit<>Stand, 10 meter walk, strength/clinical judgement;    Functional Limitation Mobility: Walking and moving around   Mobility: Walking and Moving Around Current Status [phone]) At least 20 percent but less than 40 percent impaired, limited or restricted   Mobility: Walking and Moving Around Goal Status [phone]) At least 20 percent but less than 40 percent impaired, limited or  restricted      Problem List Patient Active Problem List   Diagnosis Date Noted  . Adhesive capsulitis of left shoulder 08/06/2017  . Urinary frequency 07/11/2017  . Gait disturbance, post-stroke 05/31/2017  . Left spastic hemiparesis ([place]) 05/31/2017  . Adjustment disorder with mixed anxiety and depressed mood   . Hypokalemia   . HCAP (healthcare-associated pneumonia)   . Cough with fever   . 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 FUO (fever of unknown origin)   . Oral thrush   . Benign essential HTN   . Rheumatoid arthritis involving both hands ([place]) 04/28/2017  . 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[place]) 04/27/2017  . CVA (cerebral vascular accident) ([place]) 04/24/2017  . LGSIL Pap smear of vagina 12/15/2016  . Anal skin tag 02/02/2016  . Inflammatory arthritis 01/19/2016  . Right knee pain 01/19/2016  . Genital herpes 10/20/2015  . GERD (gastroesophageal reflux disease) 10/20/2015  . Status post vaginal hysterectomy 10/20/2015  . Health care maintenance 05/30/2015  . LLQ pain 03/31/2014  . Menopausal symptoms 03/31/2014  . Hypercholesterolemia 12/31/2012  . Hypothyroidism 12/29/2012  . Osteoarthritis 12/29/2012  . PULMONARY NODULE 01/02/2008  . Mild depression ([place]) 12/30/2007  . Essential hypertension 12/30/2007    Alanson Puls 09-16-2017, 4:55 PM  [place] MAIN [facility] SERVICES [address] [place], [place], [address] Phone: [phone]   Fax:  [phone]  Name: PAULEEN GOLEMAN MRN: [id number] Date of Birth: 12/20/1946

## 2017-08-26 NOTE — Therapy (Addendum)
[place] MAIN [facility] SERVICES [address] [place], [place], [address] Phone: [phone]   Fax:  [phone]  Physical Therapy Treatment  Patient Details  Name: Donna Rhodes MRN: [id number] Date of Birth: 17-Nov-1946 Referring Provider: Alysia Penna  Encounter Date: 08/26/2017      PT End of Session - 08/26/17 1201    Visit Number 10   Number of Visits 17   Date for PT Re-Evaluation 09/15/17   Authorization Type gcode 10   Authorization Time Period 10   PT Start Time 1155   PT Stop Time 1233   PT Time Calculation (min) 38 min   Equipment Utilized During Treatment Gait belt   Activity Tolerance Patient tolerated treatment well;No increased pain   Behavior During Therapy [facility] for tasks assessed/performed;Flat affect      Past Medical History:  Diagnosis Date  . Atrophic vaginitis   . Dysplasia of vagina    vin 1 - vulva and vaginal cuff  . GERD (gastroesophageal reflux disease)   . HCV infection   . HSV (herpes simplex virus) infection   . Hypercholesterolemia   . Hypertension   . Hypothyroidism   . Lung nodule    stable  . Menopausal state   . Osteoarthritis    hand involvement, cervical disc disease  . Osteoporosis   . Pelvic pain in female   . Rheumatoid arthritis ([place])   . Stroke (cerebrum) ([place]) 04/24/2017  . Thyroid nodule   . Vaginal Pap smear, abnormal    lgsil    Past Surgical History:  Procedure Laterality Date  . ADENOIDECTOMY    . APPENDECTOMY  1965  . [place]   left  . CHOLECYSTECTOMY    . [place]  . laser vein surgery    . MANDIBLE RECONSTRUCTION  1992  . TOENAIL EXCISION  1998   several  . [place]  . VAGINAL HYSTERECTOMY  1983   als0- anterior/posterior colporrhaphy  . VAGINAL WOUND CLOSURE / REPAIR  1999, 2000  . VAGINAL WOUND CLOSURE / REPAIR      There were no vitals filed for this visit.      Subjective Assessment - 08/26/17 1200     Subjective Patient reports that she is having some left shoulder pain that is intermittent and right now it does not hurt. She reports that she can do most home activities except changing her sheets.    Patient is accompained by: Family member   Pertinent History Pt is a 71 y.o female that presents to therapy s/p CVA; On April 24, 2017 pt suffered an acute infarct in right basal ganglia. She was in hospital 3 days; after pt was transferred to [facility] where she did inpatient therapy; Pt then completed home PT until about 2 weeks ago; Pt has pain in shoulder when moving it with R UE and reports that deltoid is paralyzed; Pt fell out of bed one day and fell on L UE; Pt reported doctor put a shot in L shoulder and will receive 3 more shots in shoulder; Pt reports no sensation or numbness and tingling with LE just some sensation impaired in L hand; Pt has an air cast for L LE and splint for wrist but does not use because difficulty donning;  Pt has had trouble sleeping at night with waking and going to the bathroom but denies any pain;  Only has pain in shoulder when moving described as throbbing; Pt  reports not using her cane in the last month and states her balance is fine without it;    Limitations Lifting;Standing;Walking   How long can you sit comfortably? hours    How long can you stand comfortably? 30 min    How long can you walk comfortably? better in the morning because more energy;    Diagnostic tests x rays in shoulder and hand; all clear; doctor stated "inferior pseudo-subluxation from paralysis of the deltoid. she has advanced left hand arthritis"   Patient Stated Goals walking better without the cane;    Currently in Pain? No/denies   Pain Score 0-No pain   Pain Onset More than a month ago   Multiple Pain Sites No      PT TREATMENT;   Nu step x 4 min BLE (unbilled)   Leg press: BLE plate 75# x 12 reps, cues to keep knees from snapping Leg press, L LE only plate 45# 2 x 22?GURK, cues  for slow movement and not snapping at knee extension Heel raise plate 75# 2 x 12 reps; cues for slowing down movement  Pt required verbal and tactile cues to improve knee positioning and to prevent hyperextension for better strengthening and control;     Rocker board fwd/ bwd and side to side x 20 each direction ,  with cues to keep her knees straight   Lunges to bosu ball x 10 BLE   cues for correct foot placement  Standing on Bosu ball with UE support x 1 min   Lateral step downs; 2 x 15; with verbal cues to slow movement and solely push through L LE;  Step ups x 20 with UE support and cues for upright posture  Eccentric step downs with UE support  X 10 BLE with right ankle inverting and adducting    Pt tolerated treatment well;                           PT Education - 08/26/17 1201    Education provided Yes   Education Details exerise technique   Person(s) Educated Patient   Methods Explanation;Demonstration   Comprehension Verbalized understanding;Returned demonstration             PT Long Term Goals - 08/24/17 1320      PT LONG TERM GOAL #1   Title Pt will become independent with HEP in order to improve ADL independence;   Baseline Pt does her HEP everyday   Time 8   Period Weeks   Status On-going   Target Date 09/15/17     PT LONG TERM GOAL #2   Title Pt will decrease 5 times sit to stand to less than 15 sec with no UE use to decrease risk of fall;   Baseline 18 sec, improve to 11.65 sec indicating pt is low fall risk for age > 67 on 8/28   Time 8   Period Weeks   Status Achieved   Target Date 09/15/17     PT LONG TERM GOAL #3   Title Pt will improve gait speed in 10MWT to .8-1.72m/s in order to be considered a comunity ambulator;   Baseline .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   Time 8   Period Weeks   Status Achieved   Target Date 09/15/17     PT LONG TERM GOAL #4    Title Pt will improve 6MWT  to 1000 feet in order to improve activity tolerance and community ambulation distance for improved IADLs (revised from 800")   Baseline 565 feet, improved to 875' on 8/28   Time 8   Period Weeks   Status Revised   Target Date 09/15/17     PT LONG TERM GOAL #5   Title Pt will increase strength in L LE to 4+/5 for all muscles in order to improve mobility and function with daily activities;    Baseline Functionally limited    Time 8   Period Weeks   Status On-going   Target Date 09/15/17               Plan - 08/27/17 1651    Clinical Impression Statement Patient performs therapeutic exercise and balance exercises in standing and has difficulty with motor control of LLE during step down exercise with left ankle inversion and hip adduction causing LLE to cross midline. Patient is aware and is unble to self correct especially with fatigue. Patient has flexed posture during standing balance exercises and will continue to benefit from skilled PT to improve strength and quality of life.    Rehab Potential Fair   Clinical Impairments Affecting Rehab Potential neg: age, lives alone, comorbidities, pos: wants to be more independent; already high functioning    PT Frequency 2x / week   PT Duration 8 weeks   PT Treatment/Interventions ADLs/Self Care Home Management;Aquatic Therapy;Moist Heat;Cryotherapy;DME Instruction;Gait training;Stair training;Functional mobility training;Therapeutic activities;Therapeutic exercise;Balance training;Neuromuscular re-education;Manual techniques;Passive range of motion;Energy conservation;Electrical Stimulation;Patient/family education;Orthotic Fit/Training   PT Next Visit Plan berg; stairs; strengthening, Assess adductor tightness   PT Home Exercise Plan continue as given;    Consulted and Agree with Plan of Care Patient      Patient will benefit from skilled therapeutic intervention in order to improve the following deficits and  impairments:  Abnormal gait, Impaired sensation, Decreased mobility, Decreased activity tolerance, Decreased endurance, Decreased range of motion, Decreased strength, Impaired UE functional use, Decreased balance, Decreased safety awareness, Difficulty walking, Pain  Visit Diagnosis: Muscle weakness (generalized)  Hemiplegia and hemiparesis following cerebral infarction affecting left dominant side (HCC)  Other lack of coordination  Other abnormalities of gait and mobility  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HCC)       G-Codes - 08-27-17 1310    Functional Assessment Tool Used (Outpatient Only) 5 times sit<>Stand, 10 meter walk, strength/clinical judgement;    Functional Limitation Mobility: Walking and moving around   Mobility: Walking and Moving Around Current Status [phone]) At least 20 percent but less than 40 percent impaired, limited or restricted   Mobility: Walking and Moving Around Goal Status [phone]) At least 20 percent but less than 40 percent impaired, limited or restricted      Problem List Patient Active Problem List   Diagnosis Date Noted  . Adhesive capsulitis of left shoulder 08/06/2017  . Urinary frequency 07/11/2017  . Gait disturbance, post-stroke 05/31/2017  . Left spastic hemiparesis ([place]) 05/31/2017  . Adjustment disorder with mixed anxiety and depressed mood   . Hypokalemia   . HCAP (healthcare-associated pneumonia)   . Cough with fever   . 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 FUO (fever of unknown origin)   . Oral thrush   . Benign essential HTN   . Rheumatoid arthritis involving both hands ([place]) 04/28/2017  . 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[place]) 04/27/2017  . CVA (cerebral vascular accident) ([place]) 04/24/2017  . LGSIL Pap smear of vagina 12/15/2016  . Anal skin tag  02/02/2016  . Inflammatory arthritis 01/19/2016  . Right knee pain 01/19/2016  . Genital herpes 10/20/2015  . GERD (gastroesophageal reflux disease) 10/20/2015  . Status post  vaginal hysterectomy 10/20/2015  . Health care maintenance 05/30/2015  . LLQ pain 03/31/2014  . Menopausal symptoms 03/31/2014  . Hypercholesterolemia 12/31/2012  . Hypothyroidism 12/29/2012  . Osteoarthritis 12/29/2012  . PULMONARY NODULE 01/02/2008  . Mild depression ([place]) 12/30/2007  . Essential hypertension 12/30/2007    Alanson Puls, PT DPT 08/26/2017, 4:56 PM  [place] MAIN [facility] SERVICES [address] [place], [place], [address] Phone: [phone]   Fax:  [phone]  Name: Donna Rhodes MRN: [id number] Date of Birth: 1946-08-03

## 2017-08-28 IMAGING — DX DG CHEST 2V
2 series · 2 of 2 positions shown · non-contrast
Comparison: PA and lateral chest x-ray May 01, 2017

CLINICAL DATA: Follow-up episode of pneumonia recently.

EXAM:
CHEST  2 VIEW

[chest pa]
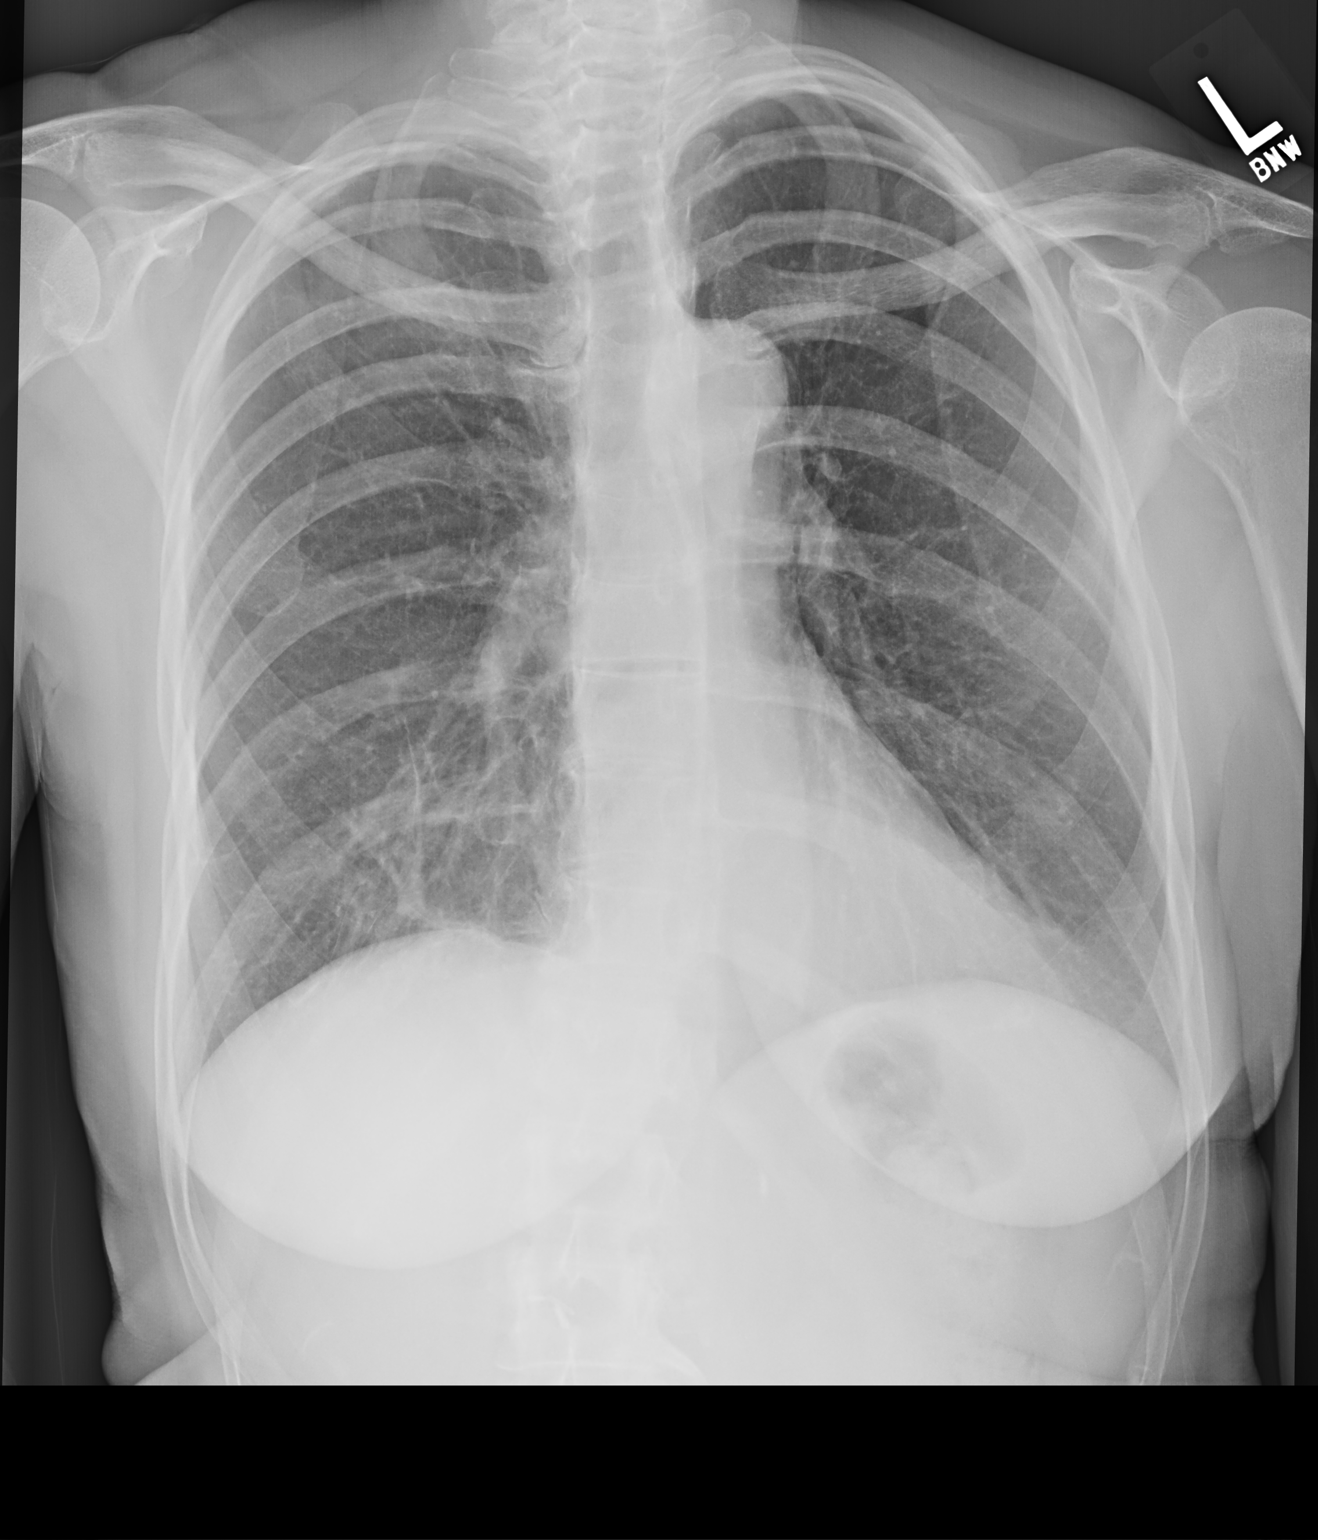

[chest lat]
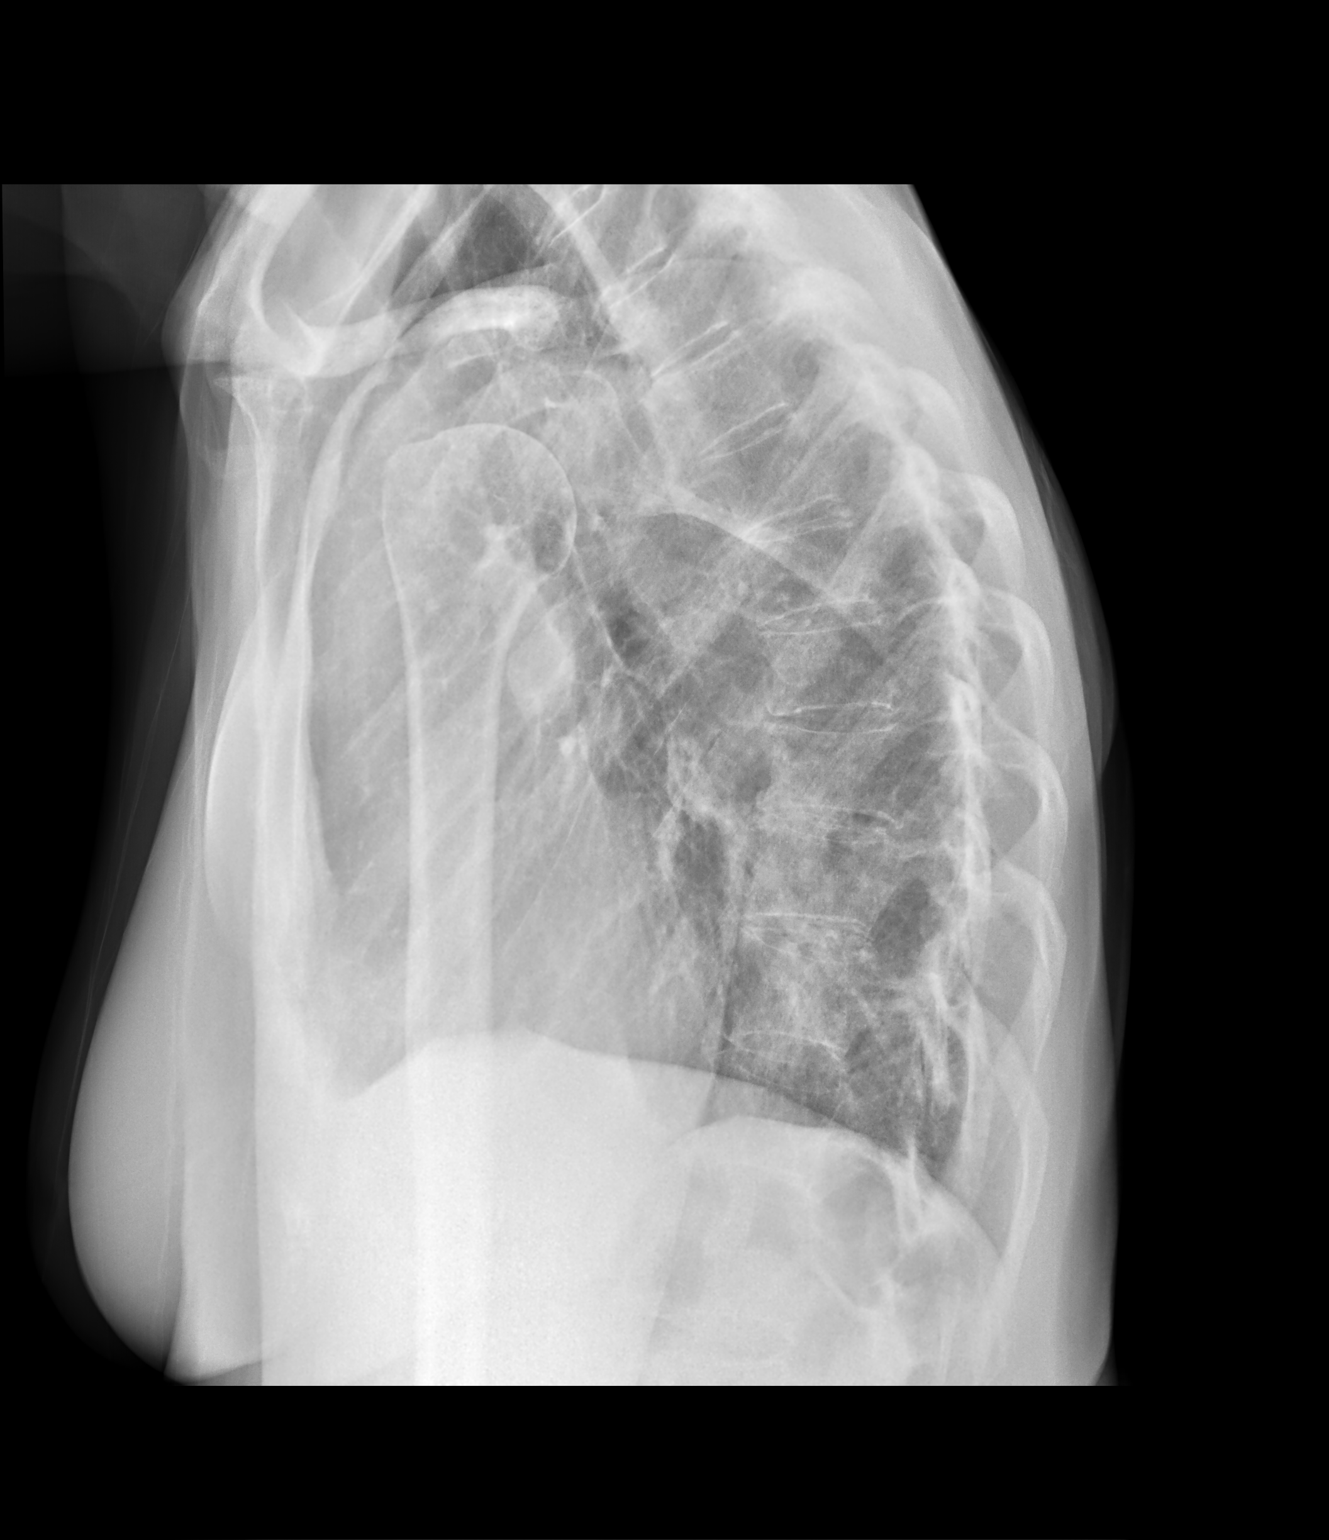

[2 of 2 positions shown; findings below may reference images not displayed]

FINDINGS: The lungs are adequately inflated. The infiltrate previously
demonstrated in the right lower lobe has nearly totally cleared.
There is persistent linear density noted just above the medial
aspect of the right hemidiaphragm on the frontal view. The left lung
is clear. The heart and pulmonary vascularity are normal. There is
calcification in the wall of the aortic arch. The observed bony
thorax exhibits no acute abnormality.
IMPRESSION: Near total clearing of the infiltrate in the right lower lobe. An
additional follow-up chest x-ray in 2-3 weeks is recommended to
assure complete clearing.

Thoracic aortic atherosclerosis.

## 2017-09-01 ENCOUNTER — Encounter: Payer: Self-pay | Admitting: Physical Therapy

## 2017-09-01 ENCOUNTER — Ambulatory Visit: Payer: PPO | Attending: Physical Medicine & Rehabilitation | Admitting: Occupational Therapy

## 2017-09-01 ENCOUNTER — Ambulatory Visit: Payer: PPO | Admitting: Physical Therapy

## 2017-09-01 DIAGNOSIS — I69352 Hemiplegia and hemiparesis following cerebral infarction affecting left dominant side: Secondary | ICD-10-CM

## 2017-09-01 DIAGNOSIS — R2689 Other abnormalities of gait and mobility: Secondary | ICD-10-CM | POA: Insufficient documentation

## 2017-09-01 DIAGNOSIS — M6281 Muscle weakness (generalized): Secondary | ICD-10-CM | POA: Insufficient documentation

## 2017-09-01 DIAGNOSIS — R278 Other lack of coordination: Secondary | ICD-10-CM | POA: Insufficient documentation

## 2017-09-01 NOTE — Therapy (Signed)
[place] MAIN [facility] SERVICES [address] [place], [place], [address] Phone: [phone]   Fax:  [phone]  Occupational Therapy Treatment  Patient Details  Name: Donna Rhodes MRN: [id number] Date of Birth: 1946/06/10 Referring Provider: Dr. Letta Pate  Encounter Date: 09/01/2017      OT End of Session - 09/01/17 1133    Visit Number 12   Number of Visits 24   Date for OT Re-Evaluation 10/13/17   Authorization Type Medicare G Code 2 of 10   OT Start Time 1030   OT Stop Time 1115   OT Time Calculation (min) 45 min   Activity Tolerance Patient tolerated treatment well   Behavior During Therapy [facility] for tasks assessed/performed      Past Medical History:  Diagnosis Date  . Atrophic vaginitis   . Dysplasia of vagina    vin 1 - vulva and vaginal cuff  . GERD (gastroesophageal reflux disease)   . HCV infection   . HSV (herpes simplex virus) infection   . Hypercholesterolemia   . Hypertension   . Hypothyroidism   . Lung nodule    stable  . Menopausal state   . Osteoarthritis    hand involvement, cervical disc disease  . Osteoporosis   . Pelvic pain in female   . Rheumatoid arthritis ([place])   . Stroke (cerebrum) ([place]) 04/24/2017  . Thyroid nodule   . Vaginal Pap smear, abnormal    lgsil    Past Surgical History:  Procedure Laterality Date  . ADENOIDECTOMY    . APPENDECTOMY  1965  . [place]   left  . CHOLECYSTECTOMY    . [place]  . laser vein surgery    . MANDIBLE RECONSTRUCTION  1992  . TOENAIL EXCISION  1998   several  . [place]  . VAGINAL HYSTERECTOMY  1983   als0- anterior/posterior colporrhaphy  . VAGINAL WOUND CLOSURE / REPAIR  1999, 2000  . VAGINAL WOUND CLOSURE / REPAIR      There were no vitals filed for this visit.      Subjective Assessment - 09/01/17 1131    Subjective  Pt. reports she has a cardiology appointment, and an appointment with Dr. Willy Rhodes next  week. Pt. reports she is getting her last shot.   Patient is accompained by: Family member   Pertinent History Pt. is a 71 y.o. female who had a CVA on 04/24/2017. Pt. went to the ER when she started not feeling well, and having heart palpitations. Pt. reports having work up initially, however was sent home. Pt. reports later in the day she was unable to write with her left hand. Pt. returned to the ER, and was admitted with workup for a CVA. Pt. was admitted to [facility], and stayed for approximately 3 weeks. Pt. returned home alone, and  received home health services for the past 2 months. Pt. now presents for Outpatient OT services. Pt. resides home alone, and has support children who stop by daily, assist with shopping, transportation, and  ADL/IADL tasks as needed.       OT TREATMENT    Therapeutic Exercise:  Pt. tolerated PROM in all joint ranges of the LUE including: shoulder flexion, abduction, horizontal abduction, elbow flexion, extension, forearm supination, wrist extension, thumb abduction, digit extension. Pt. Performed AROM for elbow flexion, extension, forearm supination.Pt. worked on hand to face patterns with her right hand in preparation for light grooming. Pt.  Was provided with education about home exercises.  Manual Therapy:  Pt. performed scapular mobilizations for scapular elevation, depression, abduction/adduction in sidelying, and sitting.Scapular mobes were performed to help normalize tone, and prepare UE for ROM and functional use.                          OT Education - 09/01/17 1133    Education provided Yes   Education Details LUE functioning.   Person(s) Educated Patient   Methods Explanation;Demonstration   Comprehension Verbalized understanding;Returned demonstration             OT Long Term Goals - 08/24/17 1209      OT LONG TERM GOAL #1   Title Pt. will tolerate increased shoulder PROM by 20 degrees with pain  improved to under 3/10   Baseline Flexion: 91, Abduction: 67   Time 12   Period Weeks   Status On-going   Target Date 10/13/17     OT LONG TERM GOAL #2   Title Pt. will formulate a full fist to be able to stabilize objects with her Left hand while using her right hand.    Baseline Digit flexion to [facility]: 2nd digit: 4cm, 3rd: 5.5, 4th: 6, 5th: 4.   Time 12   Period Weeks   Status On-going   Target Date 10/13/17     OT LONG TERM GOAL #3   Title Pt. will be able to wipe face independently with her Left UE.   Baseline Pt. is unable   Time 12   Period Weeks   Status On-going   Target Date 10/13/17     OT LONG TERM GOAL #4   Title Pt. will demonstrate independence with splint application.   Baseline Pt. is unable to donn splint    Time 12   Period Weeks   Status New   Target Date 10/13/17     OT LONG TERM GOAL #5   Title Pt. will initiate active grasp release with thumb to 2nd digit 100% of the time to be able to hold a utensil in preparation for self-feeding.   Baseline Minimal thumb movement.   Time 12   Period Weeks   Status On-going   Target Date 10/13/17     OT LONG TERM GOAL #6   Title Pt. will initiate wrist extension in preparation for functional reaching.   Baseline No active wrist extension.   Time 12   Period Weeks   Status On-going   Target Date 10/13/17               Plan - 09/01/17 1134    Clinical Impression Statement Pt. continues to present with intermittent pain throughout her LUE deeltoid, and bicep. Pt. has less pain reports at her glenohumeral joint this date. Pt. education was provided about positioning for her arm., and icing. Pt. continues to have weakness proximally. Pt. continues to work on improving UE strength, ROM, and positioning in preparation for functional use during ADLs, and IADLs.   Occupational performance deficits (Please refer to evaluation for details): ADL's;IADL's   OT Frequency 2x / week   OT Duration 12 weeks   Consulted  and Agree with Plan of Care Patient      Patient will benefit from skilled therapeutic intervention in order to improve the following deficits and impairments:  Decreased cognition, Difficulty walking, Decreased balance, Decreased strength, Decreased safety awareness, Decreased coordination, Impaired UE functional use, Impaired tone, Decreased range of  motion, Decreased endurance, Decreased activity tolerance, Impaired perceived functional ability, Pain  Visit Diagnosis: Muscle weakness (generalized)    Problem List Patient Active Problem List   Diagnosis Date Noted  . Adhesive capsulitis of left shoulder 08/06/2017  . Urinary frequency 07/11/2017  . Gait disturbance, post-stroke 05/31/2017  . Left spastic hemiparesis ([place]) 05/31/2017  . Adjustment disorder with mixed anxiety and depressed mood   . Hypokalemia   . HCAP (healthcare-associated pneumonia)   . Cough with fever   . 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 FUO (fever of unknown origin)   . Oral thrush   . Benign essential HTN   . Rheumatoid arthritis involving both hands ([place]) 04/28/2017  . 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[place]) 04/27/2017  . CVA (cerebral vascular accident) ([place]) 04/24/2017  . LGSIL Pap smear of vagina 12/15/2016  . Anal skin tag 02/02/2016  . Inflammatory arthritis 01/19/2016  . Right knee pain 01/19/2016  . Genital herpes 10/20/2015  . GERD (gastroesophageal reflux disease) 10/20/2015  . Status post vaginal hysterectomy 10/20/2015  . Health care maintenance 05/30/2015  . LLQ pain 03/31/2014  . Menopausal symptoms 03/31/2014  . Hypercholesterolemia 12/31/2012  . Hypothyroidism 12/29/2012  . Osteoarthritis 12/29/2012  . PULMONARY NODULE 01/02/2008  . Mild depression ([place]) 12/30/2007  . Essential hypertension 12/30/2007    Harrel Carina, MS, MS, OTR/L 09/01/2017, 11:46 AM  [place] MAIN [facility] SERVICES [address] [place], [place],  [address] Phone: [phone]   Fax:  [phone]  Name: AUBRIEL KHANNA MRN: [id number] Date of Birth: Jul 22, 1946

## 2017-09-01 NOTE — Therapy (Signed)
[place] MAIN [facility] SERVICES [address] [place], [place], [address] Phone: [phone]   Fax:  [phone]  Physical Therapy Treatment  Patient Details  Name: Donna Rhodes MRN: [id number] Date of Birth: [birth year] Referring Provider: Alysia Penna  Encounter Date: 09/01/2017      PT End of Session - 09/01/17 2058    Visit Number 11   Number of Visits 17   Date for PT Re-Evaluation 09/15/17   Authorization Type gcode 1   Authorization Time Period 10   PT Start Time 1116   PT Stop Time 1200   PT Time Calculation (min) 44 min   Equipment Utilized During Treatment Gait belt   Activity Tolerance Patient tolerated treatment well   Behavior During Therapy [facility] for tasks assessed/performed      Past Medical History:  Diagnosis Date  . Atrophic vaginitis   . Dysplasia of vagina    vin 1 - vulva and vaginal cuff  . GERD (gastroesophageal reflux disease)   . HCV infection   . HSV (herpes simplex virus) infection   . Hypercholesterolemia   . Hypertension   . Hypothyroidism   . Lung nodule    stable  . Menopausal state   . Osteoarthritis    hand involvement, cervical disc disease  . Osteoporosis   . Pelvic pain in female   . Rheumatoid arthritis ([place])   . Stroke (cerebrum) ([place]) 04/24/2017  . Thyroid nodule   . Vaginal Pap smear, abnormal    lgsil    Past Surgical History:  Procedure Laterality Date  . ADENOIDECTOMY    . APPENDECTOMY  1965  . [place]   left  . CHOLECYSTECTOMY    . [place]  . laser vein surgery    . MANDIBLE RECONSTRUCTION  1992  . TOENAIL EXCISION  1998   several  . [place]  . VAGINAL HYSTERECTOMY  1983   als0- anterior/posterior colporrhaphy  . VAGINAL WOUND CLOSURE / REPAIR  1999, 2000  . VAGINAL WOUND CLOSURE / REPAIR      There were no vitals filed for this visit.      Subjective Assessment - 09/01/17 1122    Subjective Patient reports that she  is tired today due to not sleeping well last night; reports she could not get her arm positioned in a way that allowed her to sleep. Pt has discussed a shoulder brace to prevent GH subluxation with OT, but she is hesitant to try one. She has an appointment with her MD on Monday to discuss her L shoulder pain further. Pt says she has no pain at this time.   Patient is accompained by: Family member   Pertinent History Pt is a 71 y.o female that presents to therapy s/p CVA; On April 24, 2017 pt suffered an acute infarct in right basal ganglia. She was in hospital 3 days; after pt was transferred to [facility] where she did inpatient therapy; Pt then completed home PT until about 2 weeks ago; Pt has pain in shoulder when moving it with R UE and reports that deltoid is paralyzed; Pt fell out of bed one day and fell on L UE; Pt reported doctor put a shot in L shoulder and will receive 3 more shots in shoulder; Pt reports no sensation or numbness and tingling with LE just some sensation impaired in L hand; Pt has an air cast for L LE and splint for wrist but  does not use because difficulty donning;  Pt has had trouble sleeping at night with waking and going to the bathroom but denies any pain;  Only has pain in shoulder when moving described as throbbing; Pt reports not using her cane in the last month and states her balance is fine without it;    Limitations Lifting;Standing;Walking   How long can you sit comfortably? hours    How long can you stand comfortably? 30 min    How long can you walk comfortably? better in the morning because more energy;    Diagnostic tests x rays in shoulder and hand; all clear; doctor stated "inferior pseudo-subluxation from paralysis of the deltoid. she has advanced left hand arthritis"   Patient Stated Goals walking better without the cane;    Currently in Pain? No/denies   Pain Onset More than a month ago        Treatment:   Crosstrainer x 4 min BLE (unbilled)    Leg  press:   BLE plate 90#  X 13 green band around knees; cues to avoid knee valgus; pt responded well with increased knee control L LE only plate 50# 2 x 12 cues to slow down and increase knee control    BLE Heel raise 95# x 15; pt had no difficulty; cues in increase AROM  Standing in parallel bars:               Zig-zag walk 4 x 10' with green t-band around ankles; modified to red t-band due to pt having limited AROM with stepping. Pt cued for foot placement and for increased eccentric control on LLE. Cues to avoid foot drag compensation.   Rocker board fwd/ bwd x 10 each direction and side to side x 20 each direction; pt had difficulty extending knee on L due to fear of hyperextended. Pt cued to extend knee further on L and focus on getting the knee straight without hyperextending. Pt able to respond with straighter knee on L.    Lunges to bosu ball x 10 LLE with decrease UE support to intermittent support as needed. Pt with increased sway but no LOB.     Step ups onto BOSU ball; pt had difficulty controlling eccentric motion at end range due to weakness; 2" foam added to either side to reduce step height and challenge balance; pt had increased eccentric control. Cues to step down slowly with increased control.     Semi-tandem on 2" foam with head turns x 10 up/down and x 10 left/right; pt with minor LOB laterally with lateral head turns requiring min UE support; pt unable to perform in full tandem.   Tandem on firm surface with head turns x 10 up/down and x 10 left/right; pt with minor LOB laterally with lateral head turns. No difficulty with vertical head turns. Cues to slow down with lateral head turns and to find center       PT Education - 09/01/17 2057    Education provided Yes   Education Details Ther-ex, balance   Person(s) Educated Patient   Methods Explanation;Demonstration;Tactile cues;Verbal cues   Comprehension Verbalized understanding;Returned demonstration;Verbal cues  required;Tactile cues required             PT Long Term Goals - 08/24/17 1320      PT LONG TERM GOAL #1   Title Pt will become independent with HEP in order to improve ADL independence;   Baseline Pt does her HEP everyday   Time 8  Period Weeks   Status On-going   Target Date 09/15/17     PT LONG TERM GOAL #2   Title Pt will decrease 5 times sit to stand to less than 15 sec with no UE use to decrease risk of fall;   Baseline 18 sec, improve to 11.65 sec indicating pt is low fall risk for age > 45 on 8/28   Time 8   Period Weeks   Status Achieved   Target Date 09/15/17     PT LONG TERM GOAL #3   Title Pt will improve gait speed in 10MWT to .8-1.80m/s in order to be considered a comunity ambulator;   Baseline .27m/s improved to 1.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 on 8/28   Time 8   Period Weeks   Status Achieved   Target Date 09/15/17     PT LONG TERM GOAL #4   Title Pt will improve 6MWT to 1000 feet in order to improve activity tolerance and community ambulation distance for improved IADLs (revised from 800")   Baseline 565 feet, improved to 875' on 8/28   Time 8   Period Weeks   Status Revised   Target Date 09/15/17     PT LONG TERM GOAL #5   Title Pt will increase strength in L LE to 4+/5 for all muscles in order to improve mobility and function with daily activities;    Baseline Functionally limited    Time 8   Period Weeks   Status On-going   Target Date 09/15/17               Plan - 09/01/17 2058    Clinical Impression Statement Pt instructed in balance training and ther-ex for LE strengthening and increased knee and ankle control. Pt performed well with increase weight tolerance on leg press, and in-session improvement in knee control with step-ups on BOSU. Pt continues to be challenged with involuntary ankle inversion and LE weakness. Pt will benefit from continued skilled therapy to maximize her functional mobility and  safety.   Rehab Potential Fair   Clinical Impairments Affecting Rehab Potential neg: age, lives alone, comorbidities, pos: wants to be more independent; already high functioning    PT Frequency 2x / week   PT Duration 8 weeks   PT Treatment/Interventions ADLs/Self Care Home Management;Aquatic Therapy;Moist Heat;Cryotherapy;DME Instruction;Gait training;Stair training;Functional mobility training;Therapeutic activities;Therapeutic exercise;Balance training;Neuromuscular re-education;Manual techniques;Passive range of motion;Energy conservation;Electrical Stimulation;Patient/family education;Orthotic Fit/Training   PT Next Visit Plan berg; stairs; strengthening, Assess adductor tightness   PT Home Exercise Plan continue as given;    Consulted and Agree with Plan of Care Patient      Patient will benefit from skilled therapeutic intervention in order to improve the following deficits and impairments:  Abnormal gait, Impaired sensation, Decreased mobility, Decreased activity tolerance, Decreased endurance, Decreased range of motion, Decreased strength, Impaired UE functional use, Decreased balance, Decreased safety awareness, Difficulty walking, Pain  Visit Diagnosis: Muscle weakness (generalized)  Hemiplegia and hemiparesis following cerebral infarction affecting left dominant side (HCC)  Other lack of coordination     Problem List Patient Active Problem List   Diagnosis Date Noted  . Adhesive capsulitis of left shoulder 08/06/2017  . Urinary frequency 07/11/2017  . Gait disturbance, post-stroke 05/31/2017  . Left spastic hemiparesis ([place]) 05/31/2017  . Adjustment disorder with mixed anxiety and depressed mood   . Hypokalemia   . HCAP (healthcare-associated pneumonia)   . Cough with fever   . Flaccid monoplegia  of upper extremity ([place])   . FUO (fever of unknown origin)   . Oral thrush   . Benign essential HTN   . Rheumatoid arthritis involving both hands ([place]) 04/28/2017  .  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[place]) 04/27/2017  . CVA (cerebral vascular accident) ([place]) 04/24/2017  . LGSIL Pap smear of vagina 12/15/2016  . Anal skin tag 02/02/2016  . Inflammatory arthritis 01/19/2016  . Right knee pain 01/19/2016  . Genital herpes 10/20/2015  . GERD (gastroesophageal reflux disease) 10/20/2015  . Status post vaginal hysterectomy 10/20/2015  . Health care maintenance 05/30/2015  . LLQ pain 03/31/2014  . Menopausal symptoms 03/31/2014  . Hypercholesterolemia 12/31/2012  . Hypothyroidism 12/29/2012  . Osteoarthritis 12/29/2012  . PULMONARY NODULE 01/02/2008  . Mild depression ([place]) 12/30/2007  . Essential hypertension 12/30/2007   Burnett Corrente, SPT This entire session was performed under direct supervision and direction of a licensed therapist/therapist assistant . I have personally read, edited and approve of the note as written.  Trotter,Margaret PT, DPT 09/02/2017, 11:00 AM  [place] MAIN [facility] SERVICES [address] [place], [place], [address] Phone: [phone]   Fax:  [phone]  Name: ELPIDIA KARN MRN: [id number] Date of Birth: 08-02-1946

## 2017-09-03 ENCOUNTER — Institutional Professional Consult (permissible substitution): Payer: PPO | Admitting: Internal Medicine

## 2017-09-03 ENCOUNTER — Ambulatory Visit: Payer: PPO | Admitting: Physical Medicine & Rehabilitation

## 2017-09-03 ENCOUNTER — Ambulatory Visit: Payer: PPO

## 2017-09-06 ENCOUNTER — Ambulatory Visit: Payer: PPO | Admitting: Physical Therapy

## 2017-09-06 ENCOUNTER — Ambulatory Visit: Payer: PPO | Admitting: Occupational Therapy

## 2017-09-06 ENCOUNTER — Encounter: Payer: Self-pay | Admitting: Internal Medicine

## 2017-09-06 ENCOUNTER — Ambulatory Visit (INDEPENDENT_AMBULATORY_CARE_PROVIDER_SITE_OTHER): Payer: PPO | Admitting: Internal Medicine

## 2017-09-06 ENCOUNTER — Telehealth: Payer: Self-pay | Admitting: Internal Medicine

## 2017-09-06 VITALS — BP 135/84 | HR 66 | Ht 64.0 in | Wt 111.0 lb

## 2017-09-06 DIAGNOSIS — I639 Cerebral infarction, unspecified: Secondary | ICD-10-CM

## 2017-09-06 NOTE — Telephone Encounter (Signed)
New message    Pt daughter is calling stating that she needs to rs procedure.

## 2017-09-06 NOTE — Patient Instructions (Signed)
Medication Instructions:  Your physician recommends that you continue on your current medications as directed. Please refer to the Current Medication list given to you today.  Labwork: None ordered.  Testing/Procedures: Your physician has recommended you have a loop recorder inserted.  This will monitor your heart rhythms to help identify any dysrhythmias.    You are scheduled for your procedure on 09/15/2017 @ 12:30 pm.  Please arrive to Short Stay via the [facility] main entrance at [employer] at 11:30 am.    Follow-Up:  You will follow up with device clinic 7-10 days after your procedure for a wound check.  You will follow up with Dr. Lovena Le 3 months after your procedure.   Any Other Special Instructions Will Be Listed Below (If Applicable).  There are no special instructions for this procedure.  If you need a refill on your cardiac medications before your next appointment, please call your pharmacy.

## 2017-09-06 NOTE — Progress Notes (Signed)
HPI [name] is referred today by Dr. Ubaldo Glassing for evaluation of cryptogenic stroke to consider insertion of an ILR. The patient sustained a stroke back in April leaving her with residual left arm, and particularly left hand deficits. She had a 2-D echo in the hospital demonstrating no obvious abnormalities except for very minimal left ventricular dysfunction with an EF of 50-55%. She has never had syncope. She denies any anginal symptoms or shortness of breath. She has palpitations and wore a cardiac monitor demonstrating both PACs and PVCs but no clear-cut atrial fibrillation. There were no pauses observed. She has tried to increase her physical activity. She has a very dense left hemiparesis of the left arm. Allergies  Allergen Reactions  . Amoxicillin Other (See Comments)    Irritated tongue  . Avelox [Moxifloxacin Hcl In Nacl]   . Azithromycin Other (See Comments)    Caused mouth and tongue to be sore  . Codeine   . [place]ro[place] Weight Management [Nutritional Supplements]     Mouth soreness  . Food     Walnuts --mouth breaks out  . Moxifloxacin Other (See Comments)  . Naproxen   . Cefepime Rash     Current Outpatient Prescriptions  Medication Sig Dispense Refill  . amLODipine (NORVASC) 2.5 MG tablet Take 1 tablet by mouth daily.    Marland Kitchen atorvastatin (LIPITOR) 20 MG tablet Take 1 tablet (20 mg total) by mouth daily. 90 tablet 1  . calcium-vitamin D (OSCAL WITH D) 250-125 MG-UNIT tablet Take 1 tablet by mouth daily.    . citalopram (CELEXA) 10 MG tablet Take 10 mg by mouth daily.    . clopidogrel (PLAVIX) 75 MG tablet Take 1 tablet (75 mg total) by mouth daily. 90 tablet 1  . Famotidine (PEPCID PO) Take by mouth 2 (two) times daily.    . folic acid (FOLVITE) 1 MG tablet Take 1 mg by mouth daily.    Marland Kitchen levothyroxine (SYNTHROID, LEVOTHROID) 50 MCG tablet Take 1 tablet (50 mcg total) by mouth daily before breakfast. 90 tablet 1  . methotrexate 50 MG/2ML injection Inject 0.97mL under  the skin once a week.    . pantoprazole (PROTONIX) 40 MG tablet Take 40 mg by mouth daily.     No current facility-administered medications for this visit.      Past Medical History:  Diagnosis Date  . Atrophic vaginitis   . Dysplasia of vagina    vin 1 - vulva and vaginal cuff  . GERD (gastroesophageal reflux disease)   . HCV infection   . HSV (herpes simplex virus) infection   . Hypercholesterolemia   . Hypertension   . Hypothyroidism   . Lung nodule    stable  . Menopausal state   . Osteoarthritis    hand involvement, cervical disc disease  . Osteoporosis   . Pelvic pain in female   . Rheumatoid arthritis ([place])   . Stroke (cerebrum) ([place]) 04/24/2017  . Thyroid nodule   . Vaginal Pap smear, abnormal    lgsil    ROS:   All systems reviewed and negative except as noted in the HPI.   Past Surgical History:  Procedure Laterality Date  . ADENOIDECTOMY    . APPENDECTOMY  1965  . [place]   left  . CHOLECYSTECTOMY    . [place]  . laser vein surgery    . MANDIBLE RECONSTRUCTION  1992  . TOENAIL EXCISION  1998   several  . TUBAL  LIGATION  1977  . VAGINAL HYSTERECTOMY  1983   als0- anterior/posterior colporrhaphy  . VAGINAL WOUND CLOSURE / REPAIR  1999, 2000  . VAGINAL WOUND CLOSURE / REPAIR       Family History  Problem Relation Age of Onset  . Hypertension Father   . Dementia Father   . Osteoarthritis Mother   . Arthritis/Rheumatoid Sister   . Breast cancer Unknown        paternal side  . Breast cancer Paternal Aunt   . Breast cancer Cousin        maternal  . Uterine cancer Maternal Grandmother   . Ovarian cancer Neg Hx   . Heart disease Neg Hx   . Diabetes Neg Hx   . Colon cancer Neg Hx      Social History   Social History  . Marital status: Divorced    Spouse name: N/A  . Number of children: 3  . Years of education: 12   Occupational History  .      retired   Social History Main Topics  . Smoking status:  Former Smoker    Quit date: 12/29/1979  . Smokeless tobacco: Never Used  . Alcohol use No  . Drug use: No  . Sexual activity: Not Currently    Birth control/ protection: Surgical   Other Topics Concern  . Not on file   Social History Narrative   Lives alone   Caffeine- 1 cup coffee     BP 135/84   Pulse 66   Ht 5\' 4"  (1.626 m)   Wt 111 lb (50.3 kg)   LMP 12/29/1981   SpO2 97%   BMI 19.05 kg/m   Physical Exam:  Well appearing 71 year old woman, NAD HEENT: Unremarkable Neck:  No JVD, no thyromegally Lymphatics:  No adenopathy Back:  No CVA tenderness Lungs:  Clear, with no wheezes, rales, or rhonchi HEART:  Regular rate rhythm, no murmurs, no rubs, no clicks Abd:  soft, positive bowel sounds, no organomegally, no rebound, no guarding Ext:  2 plus pulses, no edema, no cyanosis, no clubbing Skin:  No rashes no nodules Neuro:  CN II through XII intact, motor grossly intact, except for left arm and left hand weakness which is profound. Reviewed sinus rhythm  EKG -  none today, previously sinus rhythm   Assess/Plan: 1. Cryptogenic stroke - I discussed the treatment options with the patient and her daughter. I recommended proceeding with an insertion of an implantable loop recorder. The risk and benefits, goals and expectations of the procedure have been discussed and she wishes to proceed. 2. Palpitations - she has evidence of PVCs and PACs but no clear-cut atrial fibrillation. She will continue her current medical therapy. Additional recommendations will be based on the results of her implantable loop recorder. 3. Hypertension - her blood pressure is minimally elevated. She is on low-dose amlodipine. If it remains elevated, would consider up titration of this medication. 4. Dyslipidemia - she will continue atorvastatin. Follow labs.  Cristopher Peru, M.D.

## 2017-09-06 NOTE — Telephone Encounter (Signed)
Call received from Pt emer contact.  Need to reschedule loop implant.  Call placed to cath lab- loop implant rescheduled for 09/10/2017 @ 12:00 pm.  Pt to arrive at 11:00 am.

## 2017-09-07 ENCOUNTER — Encounter: Payer: Self-pay | Admitting: Physical Medicine & Rehabilitation

## 2017-09-07 ENCOUNTER — Encounter: Payer: PPO | Attending: Physical Medicine & Rehabilitation

## 2017-09-07 ENCOUNTER — Ambulatory Visit (HOSPITAL_BASED_OUTPATIENT_CLINIC_OR_DEPARTMENT_OTHER): Payer: PPO | Admitting: Physical Medicine & Rehabilitation

## 2017-09-07 VITALS — BP 133/75 | HR 70

## 2017-09-07 DIAGNOSIS — I10 Essential (primary) hypertension: Secondary | ICD-10-CM | POA: Insufficient documentation

## 2017-09-07 DIAGNOSIS — I69354 Hemiplegia and hemiparesis following cerebral infarction affecting left non-dominant side: Secondary | ICD-10-CM | POA: Insufficient documentation

## 2017-09-07 DIAGNOSIS — R269 Unspecified abnormalities of gait and mobility: Secondary | ICD-10-CM | POA: Insufficient documentation

## 2017-09-07 DIAGNOSIS — Z7982 Long term (current) use of aspirin: Secondary | ICD-10-CM | POA: Insufficient documentation

## 2017-09-07 DIAGNOSIS — R531 Weakness: Secondary | ICD-10-CM | POA: Insufficient documentation

## 2017-09-07 DIAGNOSIS — K219 Gastro-esophageal reflux disease without esophagitis: Secondary | ICD-10-CM | POA: Diagnosis not present

## 2017-09-07 DIAGNOSIS — M7502 Adhesive capsulitis of left shoulder: Secondary | ICD-10-CM

## 2017-09-07 DIAGNOSIS — M7989 Other specified soft tissue disorders: Secondary | ICD-10-CM | POA: Insufficient documentation

## 2017-09-07 DIAGNOSIS — E785 Hyperlipidemia, unspecified: Secondary | ICD-10-CM | POA: Insufficient documentation

## 2017-09-07 DIAGNOSIS — M069 Rheumatoid arthritis, unspecified: Secondary | ICD-10-CM | POA: Insufficient documentation

## 2017-09-07 DIAGNOSIS — Z7902 Long term (current) use of antithrombotics/antiplatelets: Secondary | ICD-10-CM | POA: Diagnosis not present

## 2017-09-07 DIAGNOSIS — G8114 Spastic hemiplegia affecting left nondominant side: Secondary | ICD-10-CM | POA: Insufficient documentation

## 2017-09-07 NOTE — Patient Instructions (Signed)
Frozen shoulder injection today  Botox injection next visit

## 2017-09-07 NOTE — Progress Notes (Signed)
Shoulder injection Left  With ultrasound guidance)  Indication:Left Shoulder pain not relieved by medication management and other conservative care.  Informed consent was obtained after describing risks and benefits of the procedure with the patient, this includes bleeding, bruising, infection and medication side effects. The patient wishes to proceed and has given written consent. Patient was placed in a seated position. The Left shoulder was marked and prepped with betadine in the subacromial area. A 25-gauge 1-1/2 inch needle was inserted under direct ultrasound visualization, long axis using a 12 Hz linear transducer. The humeral head as well as glenoid labrum was identified and needle was positioned between these 2 structures. One percent lidocaine was infiltrated into the skin and subcutaneous tissues along the needle pathway. After negative draw back for blood, a solution containing 1 mL of 6 mg per ML betamethasone and 4 mL of 1% lidocaine was injected. A band aid was applied. The patient tolerated the procedure well. Post procedure instructions were given.

## 2017-09-08 ENCOUNTER — Telehealth: Payer: Self-pay

## 2017-09-08 ENCOUNTER — Ambulatory Visit: Payer: PPO | Admitting: Occupational Therapy

## 2017-09-08 ENCOUNTER — Ambulatory Visit: Payer: PPO | Admitting: Physical Therapy

## 2017-09-08 ENCOUNTER — Encounter: Payer: Self-pay | Admitting: Physical Therapy

## 2017-09-08 DIAGNOSIS — M6281 Muscle weakness (generalized): Secondary | ICD-10-CM | POA: Diagnosis not present

## 2017-09-08 DIAGNOSIS — I69352 Hemiplegia and hemiparesis following cerebral infarction affecting left dominant side: Secondary | ICD-10-CM

## 2017-09-08 DIAGNOSIS — R278 Other lack of coordination: Secondary | ICD-10-CM

## 2017-09-08 DIAGNOSIS — R2689 Other abnormalities of gait and mobility: Secondary | ICD-10-CM

## 2017-09-08 NOTE — Therapy (Signed)
[place] MAIN [facility] SERVICES [address] [place], [place], [address] Phone: [phone]   Fax:  [phone]  Occupational Therapy Treatment  Patient Details  Name: Donna Rhodes MRN: [id number] Date of Birth: 1946-11-14 Referring Provider: Dr. Letta Pate  Encounter Date: 09/08/2017      OT End of Session - 09/08/17 1127    Visit Number 13   Number of Visits 24   Date for OT Re-Evaluation 10/13/17   Authorization Type Medicare G Code 3 of 10   OT Start Time 1062   OT Stop Time 1100   OT Time Calculation (min) 45 min   Activity Tolerance Patient tolerated treatment well   Behavior During Therapy [facility] for tasks assessed/performed      Past Medical History:  Diagnosis Date  . Atrophic vaginitis   . Dysplasia of vagina    vin 1 - vulva and vaginal cuff  . GERD (gastroesophageal reflux disease)   . HCV infection   . HSV (herpes simplex virus) infection   . Hypercholesterolemia   . Hypertension   . Hypothyroidism   . Lung nodule    stable  . Menopausal state   . Osteoarthritis    hand involvement, cervical disc disease  . Osteoporosis   . Pelvic pain in female   . Rheumatoid arthritis ([place])   . Stroke (cerebrum) ([place]) 04/24/2017  . Thyroid nodule   . Vaginal Pap smear, abnormal    lgsil    Past Surgical History:  Procedure Laterality Date  . ADENOIDECTOMY    . APPENDECTOMY  1965  . [place]   left  . CHOLECYSTECTOMY    . [place]  . laser vein surgery    . MANDIBLE RECONSTRUCTION  1992  . TOENAIL EXCISION  1998   several  . [place]  . VAGINAL HYSTERECTOMY  1983   als0- anterior/posterior colporrhaphy  . VAGINAL WOUND CLOSURE / REPAIR  1999, 2000  . VAGINAL WOUND CLOSURE / REPAIR      There were no vitals filed for this visit.      Subjective Assessment - 09/08/17 1125    Subjective  Pt. reports she has a cardiology appointment on Friday.    Patient is accompained by:  Family member   Pertinent History Pt. is a 71 y.o. female who had a CVA on 04/24/2017. Pt. went to the ER when she started not feeling well, and having heart palpitations. Pt. reports having work up initially, however was sent home. Pt. reports later in the day she was unable to write with her left hand. Pt. returned to the ER, and was admitted with workup for a CVA. Pt. was admitted to [facility], and stayed for approximately 3 weeks. Pt. returned home alone, and  received home health services for the past 2 months. Pt. now presents for Outpatient OT services. Pt. resides home alone, and has support children who stop by daily, assist with shopping, transportation, and  ADL/IADL tasks as needed.    Limitations Pt. is unable to use her dominant RUE during functional tasks.   Patient Stated Goals To get back the way she was. (Independent)   Currently in Pain? Yes      OT TREATMENT    Therapeutic Exercise:  Pt. tolerated PROM in all joint ranges of the LUE including: shoulder flexion, abduction, horizontal abduction, elbow flexion, extension, forearm supination, wrist extension, thumb abduction, digit extension. Pt. Performed AROM for elbow  flexion, extension, forearm supination.Pt. worked on hand to face patterns with her right hand in preparation for light grooming.   Manual Therapy:  Pt. performed scapular mobilizations for scapular elevation, depression, abduction/adduction in sidelying, andsitting.Scapular mobes were performed to help normalize tone, and prepare UE for ROM and functional use.                          OT Education - 09/08/17 1126    Education provided Yes   Education Details LUE ROM   Person(s) Educated Patient   Methods Explanation;Demonstration;Verbal cues   Comprehension Verbalized understanding;Returned demonstration;Verbal cues required;Need further instruction             OT Long Term Goals - 08/24/17 1209      OT  LONG TERM GOAL #1   Title Pt. will tolerate increased shoulder PROM by 20 degrees with pain improved to under 3/10   Baseline Flexion: 91, Abduction: 67   Time 12   Period Weeks   Status On-going   Target Date 10/13/17     OT LONG TERM GOAL #2   Title Pt. will formulate a full fist to be able to stabilize objects with her Left hand while using her right hand.    Baseline Digit flexion to [facility]: 2nd digit: 4cm, 3rd: 5.5, 4th: 6, 5th: 4.   Time 12   Period Weeks   Status On-going   Target Date 10/13/17     OT LONG TERM GOAL #3   Title Pt. will be able to wipe face independently with her Left UE.   Baseline Pt. is unable   Time 12   Period Weeks   Status On-going   Target Date 10/13/17     OT LONG TERM GOAL #4   Title Pt. will demonstrate independence with splint application.   Baseline Pt. is unable to donn splint    Time 12   Period Weeks   Status New   Target Date 10/13/17     OT LONG TERM GOAL #5   Title Pt. will initiate active grasp release with thumb to 2nd digit 100% of the time to be able to hold a utensil in preparation for self-feeding.   Baseline Minimal thumb movement.   Time 12   Period Weeks   Status On-going   Target Date 10/13/17     OT LONG TERM GOAL #6   Title Pt. will initiate wrist extension in preparation for functional reaching.   Baseline No active wrist extension.   Time 12   Period Weeks   Status On-going   Target Date 10/13/17               Plan - 09/08/17 1127    Clinical Impression Statement Pt. went to see Dr. Letta Pate yesterday, and had her 3rd injection in her shoulder. Pt. continues to have pain in her LUE. Pt. with limited tolerance for shoulder ROM today. Pt. reports she is using a piece of theraband, and a washcloth as a sling for her LUE at home. Pt. was provided with a visual handout last week about recommended shoulder harness brace to provide more stability/support at the shoulder. Pt. reports she does not want anything  around her neck. Pt. education was provided about the the brace, and support at the back.   Occupational performance deficits (Please refer to evaluation for details): ADL's;IADL's   Rehab Potential Good   OT Frequency 2x / week   OT Duration  12 weeks   OT Treatment/Interventions Self-care/ADL training;DME and/or AE instruction;Energy conservation;Therapeutic exercise;Neuromuscular education;Moist Heat;Therapeutic activities;Cognitive remediation/compensation;Therapeutic exercises;Patient/family education   Consulted and Agree with Plan of Care Patient      Patient will benefit from skilled therapeutic intervention in order to improve the following deficits and impairments:  Decreased cognition, Difficulty walking, Decreased balance, Decreased strength, Decreased safety awareness, Decreased coordination, Impaired UE functional use, Impaired tone, Decreased range of motion, Decreased endurance, Decreased activity tolerance, Impaired perceived functional ability, Pain  Visit Diagnosis: Muscle weakness (generalized)    Problem List Patient Active Problem List   Diagnosis Date Noted  . Adhesive capsulitis of left shoulder 08/06/2017  . Urinary frequency 07/11/2017  . Gait disturbance, post-stroke 05/31/2017  . Left spastic hemiparesis ([place]) 05/31/2017  . Adjustment disorder with mixed anxiety and depressed mood   . Hypokalemia   . HCAP (healthcare-associated pneumonia)   . Cough with fever   . 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 FUO (fever of unknown origin)   . Oral thrush   . Benign essential HTN   . Rheumatoid arthritis involving both hands ([place]) 04/28/2017  . 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[place]) 04/27/2017  . CVA (cerebral vascular accident) ([place]) 04/24/2017  . LGSIL Pap smear of vagina 12/15/2016  . Anal skin tag 02/02/2016  . Inflammatory arthritis 01/19/2016  . Right knee pain 01/19/2016  . Genital herpes 10/20/2015  . GERD (gastroesophageal reflux disease)  10/20/2015  . Status post vaginal hysterectomy 10/20/2015  . Health care maintenance 05/30/2015  . LLQ pain 03/31/2014  . Menopausal symptoms 03/31/2014  . Hypercholesterolemia 12/31/2012  . Hypothyroidism 12/29/2012  . Osteoarthritis 12/29/2012  . PULMONARY NODULE 01/02/2008  . Mild depression ([place]) 12/30/2007  . Essential hypertension 12/30/2007    Harrel Carina 09/08/2017, 11:41 AM  [place] MAIN [facility] SERVICES [address] [place], [place], [address] Phone: [phone]   Fax:  [phone]  Name: CORALIE STANKE MRN: [id number] Date of Birth: 26-Jul-1946

## 2017-09-08 NOTE — Therapy (Signed)
[place] MAIN [facility] SERVICES [address] [place], [place], [address] Phone: [phone]   Fax:  [phone]  Physical Therapy Treatment  Patient Details  Name: Donna Rhodes MRN: [id number] Date of Birth: 1945/12/30 Referring Provider: Alysia Penna  Encounter Date: 09/08/2017      PT End of Session - 09/08/17 0938    Visit Number 12   Number of Visits 17   Date for PT Re-Evaluation 09/15/17   Authorization Type gcode 2   Authorization Time Period 10   PT Start Time 0930   PT Stop Time 1015   PT Time Calculation (min) 45 min   Equipment Utilized During Treatment Gait belt   Activity Tolerance Patient tolerated treatment well;No increased pain   Behavior During Therapy WFL for tasks assessed/performed      Past Medical History:  Diagnosis Date  . Atrophic vaginitis   . Dysplasia of vagina    vin 1 - vulva and vaginal cuff  . GERD (gastroesophageal reflux disease)   . HCV infection   . HSV (herpes simplex virus) infection   . Hypercholesterolemia   . Hypertension   . Hypothyroidism   . Lung nodule    stable  . Menopausal state   . Osteoarthritis    hand involvement, cervical disc disease  . Osteoporosis   . Pelvic pain in female   . Rheumatoid arthritis ([place])   . Stroke (cerebrum) ([place]) 04/24/2017  . Thyroid nodule   . Vaginal Pap smear, abnormal    lgsil    Past Surgical History:  Procedure Laterality Date  . ADENOIDECTOMY    . APPENDECTOMY  1965  . [place]   left  . CHOLECYSTECTOMY    . [place]  . laser vein surgery    . MANDIBLE RECONSTRUCTION  1992  . TOENAIL EXCISION  1998   several  . [place]  . VAGINAL HYSTERECTOMY  1983   als0- anterior/posterior colporrhaphy  . VAGINAL WOUND CLOSURE / REPAIR  1999, 2000  . VAGINAL WOUND CLOSURE / REPAIR      There were no vitals filed for this visit.      Subjective Assessment - 09/08/17 0936    Subjective Patient  reports, "I have been running around all week with doctor appointments. I am sorry I am late this morning. I am going to need later appointments because its hard to get here at 9:15 in the morning" Patient reports still having pain in LUE arm. She also reports that she is supposed to get a heart monitor put in under her skin this week;    Patient is accompained by: Family member   Pertinent History Pt is a 71 y.o female that presents to therapy s/p CVA; On April 24, 2017 pt suffered an acute infarct in right basal ganglia. She was in hospital 3 days; after pt was transferred to [facility] where she did inpatient therapy; Pt then completed home PT until about 2 weeks ago; Pt has pain in shoulder when moving it with R UE and reports that deltoid is paralyzed; Pt fell out of bed one day and fell on L UE; Pt reported doctor put a shot in L shoulder and will receive 3 more shots in shoulder; Pt reports no sensation or numbness and tingling with LE just some sensation impaired in L hand; Pt has an air cast for L LE and splint for wrist but does not use because difficulty donning;  Pt has had trouble sleeping at night with waking and going to the bathroom but denies any pain;  Only has pain in shoulder when moving described as throbbing; Pt reports not using her cane in the last month and states her balance is fine without it;    Limitations Lifting;Standing;Walking   How long can you sit comfortably? hours    How long can you stand comfortably? 30 min    How long can you walk comfortably? better in the morning because more energy;    Diagnostic tests x rays in shoulder and hand; all clear; doctor stated "inferior pseudo-subluxation from paralysis of the deltoid. she has advanced left hand arthritis"   Patient Stated Goals walking better without the cane;    Currently in Pain? Yes   Pain Score 4    Pain Location Arm   Pain Orientation Left   Pain Descriptors / Indicators Aching   Pain Type Chronic pain    Pain Onset More than a month ago   Pain Frequency Intermittent   Aggravating Factors  lifting arm overhead   Pain Relieving Factors pt reports using tylenol and using topical creams for pain relief; also recently got an arm brace   Effect of Pain on Daily Activities decreased UE movement tolerance;    Multiple Pain Sites No        TREATMENT: Warm up on Nustep BLE only level 2 x4 min (Unbilled):  Balance: Resisted walking 12.5# xforward/backward x3 laps each direction with cues to slow down eccentric return for better gait safety; side stepping 12.5# x3 laps each direction (left/right) requiring min A for safety and cues to improve weight shift for better dynamic balance control;  Tandem stance on 1/2 bolster (flat side up) 10 sec hold x3 each foot in front with 1-0 rail assist, CGA for balance and cues to improve weight shift for better stance control;  Standing on BOSU with 1 rail assist: Heel/toe raises x15 with cues for better knee control for less ankle IV on LLE; Alternate march x15 bilaterally with cues to improve weight shift and slow down LE movement for better stance control;      Exercise: Sitting: Green tband ankle DF 2x15 with cues for foot positioning to isolate ankle DF;   Long sitting red tband ankle EV LLE only 2x15 with cues to avoid hip ER to isolate ankle EV and to improve ankle ROM for better strengthening;   Standing on 1/2 bolster (round side up): Calf stretch 15 sec hold x1 bilaterally; Heel/toe raises x15 with 1 rail assist and cues to avoid ankle IV especially on LLE for better ankle control;  Forward lunges on BOSU with 1 rail assist x10 bilaterally with cues to improve knee control for better ankle positioning. Patient demonstrates less ankle IV on LLE with better knee position. She did require increased time due to weakness and impaired motor control;  Sitting hip adduction x20 with small ball squeeze with cues to increase LLE hip adduction activation  for better strengthening;   Patient instructed in importance of better LLE knee control during stance phase of gait with better knee extension and less varus position to improve ankle control on LLE.Patient reports better foot contact and less "Foot turning in" during ambulation with improved knee control;   Reinforced HEP;                        PT Education - 09/08/17 0937    Education provided Yes  Education Details LE strengthening, balance   Person(s) Educated Patient   Methods Explanation;Demonstration;Verbal cues   Comprehension Verbalized understanding;Returned demonstration;Verbal cues required;Need further instruction             PT Long Term Goals - 08/24/17 1320      PT LONG TERM GOAL #1   Title Pt will become independent with HEP in order to improve ADL independence;   Baseline Pt does her HEP everyday   Time 8   Period Weeks   Status On-going   Target Date 09/15/17     PT LONG TERM GOAL #2   Title Pt will decrease 5 times sit to stand to less than 15 sec with no UE use to decrease risk of fall;   Baseline 18 sec, improve to 11.65 sec indicating pt is low fall risk for age > 37 on 8/28   Time 8   Period Weeks   Status Achieved   Target Date 09/15/17     PT LONG TERM GOAL #3   Title Pt will improve gait speed in 10MWT to .8-1.63m/s in order to be considered a comunity ambulator;   Baseline .82m/s improved 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   Time 8   Period Weeks   Status Achieved   Target Date 09/15/17     PT LONG TERM GOAL #4   Title Pt will improve 6MWT to 1000 feet in order to improve activity tolerance and community ambulation distance for improved IADLs (revised from 800")   Baseline 565 feet, improved to 875' on 8/28   Time 8   Period Weeks   Status Revised   Target Date 09/15/17     PT LONG TERM GOAL #5   Title Pt will increase strength in L LE to 4+/5 for all muscles in order to  improve mobility and function with daily activities;    Baseline Functionally limited    Time 8   Period Weeks   Status On-going   Target Date 09/15/17               Plan - 09/08/17 1031    Clinical Impression Statement Patient instructed in advanced strengthening exercise. Instructed patient in ankle EV and DF to improve ankle control for better gait safety; Patient continues to demonstrate increased ankle inversion at initial contact, however PT noticed that her knee is flexed and also in varus position. Instructed patient to work on improving knee control with improved knee extension in stance and less varus position. patient reports less ankle inversion and better foot contact during gait. patient would benefit from additional skilled PT intervention to improve strength, balance and gait safety;    Rehab Potential Fair   Clinical Impairments Affecting Rehab Potential neg: age, lives alone, comorbidities, pos: wants to be more independent; already high functioning    PT Frequency 2x / week   PT Duration 8 weeks   PT Treatment/Interventions ADLs/Self Care Home Management;Aquatic Therapy;Moist Heat;Cryotherapy;DME Instruction;Gait training;Stair training;Functional mobility training;Therapeutic activities;Therapeutic exercise;Balance training;Neuromuscular re-education;Manual techniques;Passive range of motion;Energy conservation;Electrical Stimulation;Patient/family education;Orthotic Fit/Training   PT Next Visit Plan berg; stairs; strengthening, Assess adductor tightness   PT Home Exercise Plan continue as given;    Consulted and Agree with Plan of Care Patient      Patient will benefit from skilled therapeutic intervention in order to improve the following deficits and impairments:  Abnormal gait, Impaired sensation, Decreased mobility, Decreased activity tolerance, Decreased endurance, Decreased range of  motion, Decreased strength, Impaired UE functional use, Decreased balance,  Decreased safety awareness, Difficulty walking, Pain  Visit Diagnosis: Muscle weakness (generalized)  Hemiplegia and hemiparesis following cerebral infarction affecting left dominant side (HCC)  Other lack of coordination  Other abnormalities of gait and mobility     Problem List Patient Active Problem List   Diagnosis Date Noted  . Adhesive capsulitis of left shoulder 08/06/2017  . Urinary frequency 07/11/2017  . Gait disturbance, post-stroke 05/31/2017  . Left spastic hemiparesis ([place]) 05/31/2017  . Adjustment disorder with mixed anxiety and depressed mood   . Hypokalemia   . HCAP (healthcare-associated pneumonia)   . Cough with fever   . 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 FUO (fever of unknown origin)   . Oral thrush   . Benign essential HTN   . Rheumatoid arthritis involving both hands ([place]) 04/28/2017  . 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[place]) 04/27/2017  . CVA (cerebral vascular accident) ([place]) 04/24/2017  . LGSIL Pap smear of vagina 12/15/2016  . Anal skin tag 02/02/2016  . Inflammatory arthritis 01/19/2016  . Right knee pain 01/19/2016  . Genital herpes 10/20/2015  . GERD (gastroesophageal reflux disease) 10/20/2015  . Status post vaginal hysterectomy 10/20/2015  . Health care maintenance 05/30/2015  . LLQ pain 03/31/2014  . Menopausal symptoms 03/31/2014  . Hypercholesterolemia 12/31/2012  . Hypothyroidism 12/29/2012  . Osteoarthritis 12/29/2012  . PULMONARY NODULE 01/02/2008  . Mild depression ([place]) 12/30/2007  . Essential hypertension 12/30/2007    Domonic Kimball PT, DPT 09/08/2017, 10:33 AM  [place] MAIN [facility] SERVICES [address] [place], [place], [address] Phone: [phone]   Fax:  [phone]  Name: Donna Rhodes MRN: [id number] Date of Birth: 03-27-1946

## 2017-09-08 NOTE — Telephone Encounter (Signed)
Call placed to Pt,  Pt requested this nurse call daughter.  Spoke with daughter Manuela Schwartz, rescheduled loop implant d/t weather.  Rescheduled for 09/24/2017 @ 1130 am.  Daughter indicates understanding.

## 2017-09-10 ENCOUNTER — Other Ambulatory Visit: Payer: Self-pay | Admitting: *Deleted

## 2017-09-10 NOTE — Patient Outreach (Signed)
Telephone call to patient for follow up on [place]. No answer to telephone, received message "user busy"  PLAN Outreach pt next week  Jacqlyn Larsen [facility], [place] Coordinator [phone]

## 2017-09-13 ENCOUNTER — Ambulatory Visit: Payer: PPO | Admitting: Physical Therapy

## 2017-09-13 ENCOUNTER — Ambulatory Visit: Payer: PPO | Admitting: Occupational Therapy

## 2017-09-15 ENCOUNTER — Encounter: Payer: Self-pay | Admitting: Physical Therapy

## 2017-09-15 ENCOUNTER — Ambulatory Visit: Payer: PPO | Admitting: Occupational Therapy

## 2017-09-15 ENCOUNTER — Ambulatory Visit: Payer: PPO | Admitting: Physical Therapy

## 2017-09-15 DIAGNOSIS — M6281 Muscle weakness (generalized): Secondary | ICD-10-CM

## 2017-09-15 DIAGNOSIS — R278 Other lack of coordination: Secondary | ICD-10-CM

## 2017-09-15 DIAGNOSIS — I69352 Hemiplegia and hemiparesis following cerebral infarction affecting left dominant side: Secondary | ICD-10-CM

## 2017-09-15 NOTE — Therapy (Signed)
[place] MAIN [facility] SERVICES [address] [place], [place], [address] Phone: [phone]   Fax:  [phone]  Physical Therapy Treatment  Patient Details  Name: Donna Rhodes MRN: [id number] Date of Birth: 1946/09/30 Referring Provider: Alysia Penna  Encounter Date: 09/15/2017      PT End of Session - 09/15/17 1118    Visit Number 13   Number of Visits 41   Date for PT Re-Evaluation 12/08/17   Authorization Type gcode 3   Authorization Time Period 10   PT Start Time 1117   PT Stop Time 1200   PT Time Calculation (min) 43 min   Equipment Utilized During Treatment Gait belt   Activity Tolerance Patient tolerated treatment well   Behavior During Therapy [facility] for tasks assessed/performed      Past Medical History:  Diagnosis Date  . Atrophic vaginitis   . Dysplasia of vagina    vin 1 - vulva and vaginal cuff  . GERD (gastroesophageal reflux disease)   . HCV infection   . HSV (herpes simplex virus) infection   . Hypercholesterolemia   . Hypertension   . Hypothyroidism   . Lung nodule    stable  . Menopausal state   . Osteoarthritis    hand involvement, cervical disc disease  . Osteoporosis   . Pelvic pain in female   . Rheumatoid arthritis ([place])   . Stroke (cerebrum) ([place]) 04/24/2017  . Thyroid nodule   . Vaginal Pap smear, abnormal    lgsil    Past Surgical History:  Procedure Laterality Date  . ADENOIDECTOMY    . APPENDECTOMY  1965  . [place]   left  . CHOLECYSTECTOMY    . [place]  . laser vein surgery    . MANDIBLE RECONSTRUCTION  1992  . TOENAIL EXCISION  1998   several  . [place]  . VAGINAL HYSTERECTOMY  1983   als0- anterior/posterior colporrhaphy  . VAGINAL WOUND CLOSURE / REPAIR  1999, 2000  . VAGINAL WOUND CLOSURE / REPAIR      There were no vitals filed for this visit.      Subjective Assessment - 09/15/17 1116    Subjective Patient reports she is  doing ok. Pt is scheduled for Loop recorder insertion surgery on 9/28. Pt denies pain at this time, but reports she has had L posterior thigh pain this past week intermittently.    Patient is accompained by: Family member   Pertinent History Pt is a 71 y.o female that presents to therapy s/p CVA; On April 24, 2017 pt suffered an acute infarct in right basal ganglia. She was in hospital 3 days; after pt was transferred to [facility] where she did inpatient therapy; Pt then completed home PT until about 2 weeks ago; Pt has pain in shoulder when moving it with R UE and reports that deltoid is paralyzed; Pt fell out of bed one day and fell on L UE; Pt reported doctor put a shot in L shoulder and will receive 3 more shots in shoulder; Pt reports no sensation or numbness and tingling with LE just some sensation impaired in L hand; Pt has an air cast for L LE and splint for wrist but does not use because difficulty donning;  Pt has had trouble sleeping at night with waking and going to the bathroom but denies any pain;  Only has pain in shoulder when moving described as throbbing; Pt reports  not using her cane in the last month and states her balance is fine without it;    Limitations Lifting;Standing;Walking   How long can you sit comfortably? hours    How long can you stand comfortably? 30 min    How long can you walk comfortably? better in the morning because more energy;    Diagnostic tests x rays in shoulder and hand; all clear; doctor stated "inferior pseudo-subluxation from paralysis of the deltoid. she has advanced left hand arthritis"   Patient Stated Goals walking better without the cane;    Currently in Pain? 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 Pt's L ankle was limited in DF which was actively assisted from contralateral ankle DF.   Hip hike in SLS on LLE on 5" step with RUE assist in order to strengthen L hip abduction functionally and to challenge knee control on LLE. Pt was able to maintain knee control with minimal instability on LLE during first 10 reps. Pt with decreased knee control during second set of 10 reps; exercise discontinued after first set to prevent injury to L knee.     Ther-ex:   TKE resisted with blue t-band 2 x 10 in LLE; cues to isolate motion and keep contralateral LE straight. Cues to step posteriorly for increased resistance. Pt reported she has been unable to perform this exercise for HEP due to difficulty with set up.   Repeated with ball squeeze against wall for quad contraction and increase knee control; pt was able to perform with min cues for foot placement. Pt instructed to continue this exercise for HEP.         PT Education - 09/15/17 1118    Education provided Yes   Education Details Progress towards goals, ther-ex, neuro re-education    Person(s) Educated Patient   Methods Explanation;Demonstration;Tactile cues;Verbal cues   Comprehension Verbal cues required;Returned demonstration;Verbalized understanding;Tactile cues required             PT Long Term Goals - 09/15/17 1143      PT LONG TERM GOAL #1   Title Pt will become independent with HEP in order to improve ADL independence;   Baseline Pt performs several exercises frequently, but inconsistently performs other exercises.    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 on 8/28   Time 12   Period Weeks   Status Revised   Target Date 12/08/17     PT LONG TERM GOAL #4   Title Pt will improve 6MWT to >1300 feet in order to be considered 80% of age group norm and to improve activity tolerance and community ambulation distance for improved IADLs (revised from 1000")   Baseline 565 feet, improved to 875' on 8/28, improved to 1105' on 9/19   Time 12   Period Weeks   Status Revised   Target Date 12/08/17     PT LONG TERM GOAL #5   Title Pt will increase strength in L LE to 4+/5 for all muscles in order to improve mobility and function with daily activities;    Baseline pt is 4+/5 in all LE except for L hip abduction: 4/5   Time 12   Period Weeks   Status Partially Met   Target Date 12/08/17     PT LONG TERM GOAL #6   Title Pt will ambulate with equal stride length, no toe drag on LLE, and equal stance and swing time bilaterally to demonstrate improved gait mechanics for more efficient and functional mobility.   Time 12   Period Weeks   Status New   Target Date 12/08/17               Plan - 09/15/17 1211    Clinical Impression Statement Pt's goals reassessed with good improvement in 6 min walk test surpassing community ambulation distance. Pt is very close to meeting her strength goals with only L hip abduction limited at 4/5 strength. Pt has met all other goals. Pt's 6 min walk goal revised due to pt still being at only 67% of age group norm. Pt continues to be  limited by abnormality of gait, though she has shown improvement with decreased toe drag on L. Gait goal added to reflect pt's personal goals. Pt will benefit from continued skilled therapy to maximize her functional mobility and safety   Rehab Potential Fair   Clinical Impairments Affecting Rehab Potential neg: age, lives alone, comorbidities, pos: wants to be more independent; already high functioning    PT Frequency 2x / week   PT Duration 12 weeks   PT Treatment/Interventions ADLs/Self Care Home Management;Aquatic Therapy;Moist Heat;Cryotherapy;DME Instruction;Gait training;Stair training;Functional mobility training;Therapeutic activities;Therapeutic exercise;Balance training;Neuromuscular re-education;Manual techniques;Passive range of motion;Energy conservation;Electrical Stimulation;Patient/family education;Orthotic Fit/Training   PT Next Visit Plan berg; stairs; strengthening, Assess adductor tightness   PT Home Exercise Plan continue as given;    Consulted and Agree with Plan of Care Patient      Patient will benefit from skilled therapeutic intervention in order to improve the following deficits and impairments:  Abnormal gait, Impaired sensation, Decreased mobility, Decreased activity tolerance, Decreased endurance, Decreased range of motion, Decreased strength, Impaired UE functional use, Decreased balance, Decreased safety awareness, Difficulty walking, Pain  Visit Diagnosis: Muscle weakness (generalized)  Hemiplegia and hemiparesis following cerebral infarction affecting left dominant side (HCC)  Other lack of coordination     Problem List Patient Active Problem List   Diagnosis Date Noted  . Adhesive capsulitis of left shoulder 08/06/2017  . Urinary frequency 07/11/2017  . Gait disturbance, post-stroke 05/31/2017  . Left spastic [place]iparesis ([place]6/03/2017  . Adjustment disorder with  mixed anxiety and depressed mood   . Hypokalemia   . HCAP (healthcare-associated  pneumonia)   . Cough with fever   . 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 FUO (fever of unknown origin)   . Oral thrush   . Benign essential HTN   . Rheumatoid arthritis involving both hands ([place]) 04/28/2017  . 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[place]) 04/27/2017  . CVA (cerebral vascular accident) ([place]) 04/24/2017  . LGSIL Pap smear of vagina 12/15/2016  . Anal skin tag 02/02/2016  . Inflammatory arthritis 01/19/2016  . Right knee pain 01/19/2016  . Genital herpes 10/20/2015  . GERD (gastroesophageal reflux disease) 10/20/2015  . Status post vaginal hysterectomy 10/20/2015  . Health care maintenance 05/30/2015  . LLQ pain 03/31/2014  . Menopausal symptoms 03/31/2014  . Hypercholesterolemia 12/31/2012  . Hypothyroidism 12/29/2012  . Osteoarthritis 12/29/2012  . PULMONARY NODULE 01/02/2008  . Mild depression ([place]) 12/30/2007  . Essential hypertension 12/30/2007   Burnett Corrente, SPT This entire session was performed under direct supervision and direction of a licensed therapist/therapist assistant . I have personally read, edited and approve of the note as written.  Trotter,Margaret PT, DPT 09/16/2017, 10:00 AM  [place] MAIN [facility] SERVICES [address] [place], [place], [address] Phone: [phone]   Fax:  [phone]  Name: Donna Rhodes MRN: [id number] Date of Birth: [birth year]

## 2017-09-15 NOTE — Therapy (Signed)
[place] MAIN [facility] SERVICES [address] [place], [place], [address] Phone: [phone]   Fax:  [phone]  Occupational Therapy Treatment  Patient Details  Name: Donna Rhodes MRN: [id number] Date of Birth: 1946/02/13 Referring Provider: Dr. Letta Pate  Encounter Date: 09/15/2017      OT End of Session - 09/15/17 1208    Visit Number 14   Number of Visits 24   Date for OT Re-Evaluation 10/13/17   Authorization Type Medicare G Code 4 of 10   OT Start Time 1030   OT Stop Time 1115   OT Time Calculation (min) 45 min   Activity Tolerance Patient tolerated treatment well   Behavior During Therapy [facility] for tasks assessed/performed      Past Medical History:  Diagnosis Date  . Atrophic vaginitis   . Dysplasia of vagina    vin 1 - vulva and vaginal cuff  . GERD (gastroesophageal reflux disease)   . HCV infection   . HSV (herpes simplex virus) infection   . Hypercholesterolemia   . Hypertension   . Hypothyroidism   . Lung nodule    stable  . Menopausal state   . Osteoarthritis    hand involvement, cervical disc disease  . Osteoporosis   . Pelvic pain in female   . Rheumatoid arthritis ([place])   . Stroke (cerebrum) ([place]) 04/24/2017  . Thyroid nodule   . Vaginal Pap smear, abnormal    lgsil    Past Surgical History:  Procedure Laterality Date  . ADENOIDECTOMY    . APPENDECTOMY  1965  . [place]   left  . CHOLECYSTECTOMY    . [place]  . laser vein surgery    . MANDIBLE RECONSTRUCTION  1992  . TOENAIL EXCISION  1998   several  . [place]  . VAGINAL HYSTERECTOMY  1983   als0- anterior/posterior colporrhaphy  . VAGINAL WOUND CLOSURE / REPAIR  1999, 2000  . VAGINAL WOUND CLOSURE / REPAIR      There were no vitals filed for this visit.      Subjective Assessment - 09/15/17 1207    Subjective  Pt. reports she had to reschedule the Loop procedure for later in the month.   Patient  is accompained by: Family member   Pertinent History Pt. is a 71 y.o. female who had a CVA on 04/24/2017. Pt. went to the ER when she started not feeling well, and having heart palpitations. Pt. reports having work up initially, however was sent home. Pt. reports later in the day she was unable to write with her left hand. Pt. returned to the ER, and was admitted with workup for a CVA. Pt. was admitted to [facility], and stayed for approximately 3 weeks. Pt. returned home alone, and  received home health services for the past 2 months. Pt. now presents for Outpatient OT services. Pt. resides home alone, and has support children who stop by daily, assist with shopping, transportation, and  ADL/IADL tasks as needed.    Limitations Pt. is unable to use her dominant RUE during functional tasks.   Currently in Pain? Yes   Pain Score 6       OT TREATMENT   Therapeutic Exercise:  Pt. tolerated PROM in all joint ranges of the LUE including: shoulder flexion, abduction, horizontal abduction, elbow flexion, extension, forearm supination, wrist extension, thumb abduction, digit extension. Pt. Performed AROM for elbow flexion, extension, forearm supination.Pt.  worked on hand to face patterns with her right hand in preparation for light grooming.   Manual Therapy:  Pt. performed scapular mobilizations for scapular elevation, depression, abduction/adduction in sidelying, andsitting.Scapular mobes were performed to help normalize tone, and prepare UE for ROM and functional use.                           OT Education - 09/15/17 1207    Education provided Yes   Education Details LUE ROM   Person(s) Educated Patient   Methods Explanation;Demonstration;Tactile cues;Verbal cues   Comprehension Verbalized understanding;Returned demonstration;Verbal cues required;Tactile cues required             OT Long Term Goals - 08/24/17 1209      OT LONG TERM GOAL #1    Title Pt. will tolerate increased shoulder PROM by 20 degrees with pain improved to under 3/10   Baseline Flexion: 91, Abduction: 67   Time 12   Period Weeks   Status On-going   Target Date 10/13/17     OT LONG TERM GOAL #2   Title Pt. will formulate a full fist to be able to stabilize objects with her Left hand while using her right hand.    Baseline Digit flexion to [facility]: 2nd digit: 4cm, 3rd: 5.5, 4th: 6, 5th: 4.   Time 12   Period Weeks   Status On-going   Target Date 10/13/17     OT LONG TERM GOAL #3   Title Pt. will be able to wipe face independently with her Left UE.   Baseline Pt. is unable   Time 12   Period Weeks   Status On-going   Target Date 10/13/17     OT LONG TERM GOAL #4   Title Pt. will demonstrate independence with splint application.   Baseline Pt. is unable to donn splint    Time 12   Period Weeks   Status New   Target Date 10/13/17     OT LONG TERM GOAL #5   Title Pt. will initiate active grasp release with thumb to 2nd digit 100% of the time to be able to hold a utensil in preparation for self-feeding.   Baseline Minimal thumb movement.   Time 12   Period Weeks   Status On-going   Target Date 10/13/17     OT LONG TERM GOAL #6   Title Pt. will initiate wrist extension in preparation for functional reaching.   Baseline No active wrist extension.   Time 12   Period Weeks   Status On-going   Target Date 10/13/17               Plan - 09/15/17 1208    Clinical Impression Statement Pt. reports has intermittent pain in multiple areas of the left shoulder. Pt. reports intermittent clavicle pain, pain at the glenohumeral joint, deltoid, and bicep pain. Pt. had pain intially, however was able to tolerate more ROM throughout the session. Pt. requires verbal cues for redirection. Pt. reports she will think about a shoulder brace to support her shoulder. Pt. requires frequent cues, and assist to donn the splint.   Occupational performance deficits  (Please refer to evaluation for details): IADL's;ADL's   Rehab Potential Good   OT Frequency 2x / week   OT Duration 12 weeks   OT Treatment/Interventions Self-care/ADL training;DME and/or AE instruction;Energy conservation;Therapeutic exercise;Neuromuscular education;Moist Heat;Therapeutic activities;Cognitive remediation/compensation;Therapeutic exercises;Patient/family education   Clinical Decision Making Multiple treatment options, significant  modification of task necessary   Consulted and Agree with Plan of Care Patient      Patient will benefit from skilled therapeutic intervention in order to improve the following deficits and impairments:  Decreased cognition, Difficulty walking, Decreased balance, Decreased strength, Decreased safety awareness, Decreased coordination, Impaired UE functional use, Impaired tone, Decreased range of motion, Decreased endurance, Decreased activity tolerance, Impaired perceived functional ability, Pain  Visit Diagnosis: Muscle weakness (generalized)    Problem List Patient Active Problem List   Diagnosis Date Noted  . Adhesive capsulitis of left shoulder 08/06/2017  . Urinary frequency 07/11/2017  . Gait disturbance, post-stroke 05/31/2017  . Left spastic hemiparesis ([place]) 05/31/2017  . Adjustment disorder with mixed anxiety and depressed mood   . Hypokalemia   . HCAP (healthcare-associated pneumonia)   . Cough with fever   . Flaccid monoplegia of upper extremity ([place])   . FUO (fever of unknown origin)   . Oral thrush   . Benign essential HTN   . Rheumatoid arthritis involving both hands ([place]) 04/28/2017  . 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[place]) 04/27/2017  . CVA (cerebral vascular accident) ([place]) 04/24/2017  . LGSIL Pap smear of vagina 12/15/2016  . Anal skin tag 02/02/2016  . Inflammatory arthritis 01/19/2016  . Right knee pain 01/19/2016  . Genital herpes 10/20/2015  . GERD (gastroesophageal reflux disease) 10/20/2015  . Status post  vaginal hysterectomy 10/20/2015  . Health care maintenance 05/30/2015  . LLQ pain 03/31/2014  . Menopausal symptoms 03/31/2014  . Hypercholesterolemia 12/31/2012  . Hypothyroidism 12/29/2012  . Osteoarthritis 12/29/2012  . PULMONARY NODULE 01/02/2008  . Mild depression ([place]) 12/30/2007  . Essential hypertension 12/30/2007    Harrel Carina 09/15/2017, 12:18 PM  [place] MAIN [facility] SERVICES [address] [place], [place], [address] Phone: [phone]   Fax:  [phone]  Name: Donna Rhodes MRN: [id number] Date of Birth: 16-Dec-1946

## 2017-09-17 ENCOUNTER — Other Ambulatory Visit: Payer: Self-pay | Admitting: *Deleted

## 2017-09-17 ENCOUNTER — Encounter: Payer: Self-pay | Admitting: *Deleted

## 2017-09-17 NOTE — Patient Outreach (Signed)
Telephone call to patient for follow up on [place]. Spoke with pt, HIPAA verified, pt reports she lives alone and her family assists her with check writing, transportation, housework, pt states she is able to bathe now "and do more than I used to since having a stroke"  Pt states able to ambulate and has cane that she has on hand if needed.  Pt reports she attends outpatient PT and OT "and this has really helped"  Pt sees primary MD Dr. Einar Pheasant regularly, sees cardiologist and will be wearing heart monitor in near future.  Pt reports she has approximately 10 medications and is able to afford, her daughter assists with medications and pt denies any concerns or problems with medications.  Pt states she has lost approximately 10 pounds over past few months due to "food just doesn't taste good" pt states she drinks at least one ensure daily and has money/ resources for food.  Pt reports she has no needs for care management at this time and that attending PT and OT and getting stronger is what she is working towards.  RN CM mailed successful outreach letter to patient's home.  Jacqlyn Larsen [facility], [place] Coordinator [phone]

## 2017-09-20 ENCOUNTER — Ambulatory Visit: Payer: PPO | Admitting: Occupational Therapy

## 2017-09-20 ENCOUNTER — Ambulatory Visit: Payer: PPO | Admitting: Physical Therapy

## 2017-09-20 ENCOUNTER — Encounter: Payer: Self-pay | Admitting: Physical Therapy

## 2017-09-20 DIAGNOSIS — M6281 Muscle weakness (generalized): Secondary | ICD-10-CM

## 2017-09-20 DIAGNOSIS — R2689 Other abnormalities of gait and mobility: Secondary | ICD-10-CM

## 2017-09-20 DIAGNOSIS — R278 Other lack of coordination: Secondary | ICD-10-CM

## 2017-09-20 NOTE — Therapy (Signed)
[place] MAIN [facility] SERVICES [address] [place], [place], [address] Phone: [phone]   Fax:  [phone]  Physical Therapy Treatment  Patient Details  Name: Donna Rhodes MRN: [id number] Date of Birth: [birth year] Referring Provider: Alysia Penna  Encounter Date: 09/20/2017      PT End of Session - 09/20/17 1111    Visit Number 14   Number of Visits 41   Date for PT Re-Evaluation 12/08/17   Authorization Type gcode 4   Authorization Time Period 10   PT Start Time 1115   PT Stop Time 1200   PT Time Calculation (min) 45 min   Equipment Utilized During Treatment Gait belt   Activity Tolerance Patient tolerated treatment well   Behavior During Therapy [facility] for tasks assessed/performed      Past Medical History:  Diagnosis Date  . Atrophic vaginitis   . Dysplasia of vagina    vin 1 - vulva and vaginal cuff  . GERD (gastroesophageal reflux disease)   . HCV infection   . HSV (herpes simplex virus) infection   . Hypercholesterolemia   . Hypertension   . Hypothyroidism   . Lung nodule    stable  . Menopausal state   . Osteoarthritis    hand involvement, cervical disc disease  . Osteoporosis   . Pelvic pain in female   . Rheumatoid arthritis ([place])   . Stroke (cerebrum) ([place]) 04/24/2017  . Thyroid nodule   . Vaginal Pap smear, abnormal    lgsil    Past Surgical History:  Procedure Laterality Date  . ADENOIDECTOMY    . APPENDECTOMY  1965  . [place]   left  . CHOLECYSTECTOMY    . [place]  . laser vein surgery    . MANDIBLE RECONSTRUCTION  1992  . TOENAIL EXCISION  1998   several  . [place]  . VAGINAL HYSTERECTOMY  1983   als0- anterior/posterior colporrhaphy  . VAGINAL WOUND CLOSURE / REPAIR  1999, 2000  . VAGINAL WOUND CLOSURE / REPAIR      There were no vitals filed for this visit.      Subjective Assessment - 09/20/17 1123    Subjective Patient reports doing  okay; She reports, "My leg is still weak". she denies any pain; no new falls; reports that her is going okay but she feels that her leg still pulls in;    Patient is accompained by: Family member   Pertinent History Pt is a 71 y.o female that presents to therapy s/p CVA; On April 24, 2017 pt suffered an acute infarct in right basal ganglia. She was in hospital 3 days; after pt was transferred to [facility] where she did inpatient therapy; Pt then completed home PT until about 2 weeks ago; Pt has pain in shoulder when moving it with R UE and reports that deltoid is paralyzed; Pt fell out of bed one day and fell on L UE; Pt reported doctor put a shot in L shoulder and will receive 3 more shots in shoulder; Pt reports no sensation or numbness and tingling with LE just some sensation impaired in L hand; Pt has an air cast for L LE and splint for wrist but does not use because difficulty donning;  Pt has had trouble sleeping at night with waking and going to the bathroom but denies any pain;  Only has pain in shoulder when moving described as throbbing; Pt reports not using  her cane in the last month and states her balance is fine without it;    Limitations Lifting;Standing;Walking   How long can you sit comfortably? hours    How long can you stand comfortably? 30 min    How long can you walk comfortably? better in the morning because more energy;    Diagnostic tests x rays in shoulder and hand; all clear; doctor stated "inferior pseudo-subluxation from paralysis of the deltoid. she has advanced left hand arthritis"   Patient Stated Goals walking better without the cane;    Currently in Pain? No/denies   Pain Onset More than a month ago       TREATMENT: Patient supine; LLE single leg bridge 2x15 with cues for positioning and to keep arms across chest for better hip strengthening;  LLE hip abduction/ER green tband x15 with min A to hold RLE to isolate LLE movement for better strengthening; Right  sidelying; LLE clamshells green tband 2x10 with cues for positioning (stay rolled on right side) for better hip abductor strengthening; Patient able to do exercise through partial ROM;  Leg press: LLE only plate 45# 0P54 with cues to slow down LE movement and to avoid hip/knee ER for better quad control;   Standing on 1/2 bolster (round side up) Calf stretch 15 sec hold x2 reps; BLE heel raises x10 reps with 1 rail assist;  Seated: BLE hip adduction ball squeeze x20 reps with cues to increase LLE hip adduction for better LLE hip strengthening;   Balance: Tandem stance on 1/2 bolster (flat side up) 10 sec hold without HHA x2 reps each LE in front; Attempted SLS on dyna disc- patient exhibits increased LLE ankle instability; SLS on foam, CGA to min A for safety 10 sec hold with 1-0 rail assist x2 reps each LE with cues for increased LLE hip abduction for less right hip trendelenburg (advanced HEP- see patient instructions);  Airex beam: Side stepping with 1 rail assist x3 laps each direction with cues to keep LLE ankle DF for better ankle control;  tandem stance with RUE ball pass side/side x3 reps, x3 sets with each foot in front; requires min A for safety and cues to improve upper trunk control and weight shift for better balance with narrow stance;                         PT Education - 09/20/17 1123    Education provided Yes   Education Details LE strengthening, balance;    Person(s) Educated Patient   Methods Explanation;Demonstration;Verbal cues   Comprehension Verbalized understanding;Returned demonstration;Verbal cues required;Need further instruction             PT Long Term Goals - 09/15/17 1143      PT LONG TERM GOAL #1   Title Pt will become independent with HEP in order to improve ADL independence;   Baseline Pt performs several exercises frequently, but inconsistently performs other exercises.    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 on 8/28   Time 12   Period Weeks   Status Revised   Target Date 12/08/17     PT LONG TERM GOAL #4   Title Pt will improve 6MWT to >1300 feet in order to be considered 80% of age group norm and to improve activity tolerance and community ambulation distance for improved IADLs (revised from 1000")   Baseline 565 feet, improved to 875' on 8/28, improved to 1105' on 9/19   Time 12   Period Weeks   Status Revised   Target Date 12/08/17     PT LONG TERM GOAL #5   Title Pt will increase strength in L LE to 4+/5 for all muscles in order to improve mobility and function with daily activities;    Baseline pt is 4+/5 in all LE except for L hip abduction: 4/5   Time 12   Period Weeks   Status Partially Met   Target Date 12/08/17     PT LONG TERM GOAL #6   Title Pt will ambulate with equal stride length, no toe drag on LLE, and equal stance and swing time bilaterally to demonstrate improved gait mechanics for more efficient and functional mobility.   Time 12   Period Weeks   Status New   Target Date 12/08/17               Plan - 09/20/17 1157    Clinical Impression Statement Patient instructed in advanced LE strengthening. She did require cues for correct positioning to isolate LLE hip abductor strengthening. Patient also instructed in advanced balance exercise. Advanced HEP with SLS on firm surface (see patient  instructions). Patient continues to need CGA to min A with narrow base of support and cues to improve weight shift/trunk control for better stance control. Patient would benefit from additional skilled PT intervention to improve balance, strength and mobility;    Rehab Potential Fair   Clinical Impairments Affecting Rehab Potential neg: age, lives alone, comorbidities, pos: wants to be more independent; already high functioning    PT Frequency 2x / week   PT Duration 12 weeks   PT Treatment/Interventions ADLs/Self Care Home Management;Aquatic Therapy;Moist Heat;Cryotherapy;DME Instruction;Gait training;Stair training;Functional mobility training;Therapeutic activities;Therapeutic exercise;Balance training;Neuromuscular re-education;Manual techniques;Passive range of motion;Energy conservation;Electrical Stimulation;Patient/family education;Orthotic Fit/Training   PT Next Visit Plan berg; stairs; strengthening, Assess adductor tightness   PT Home Exercise Plan continue as given;    Consulted and Agree with Plan of Care Patient      Patient will benefit from skilled therapeutic intervention in order to improve the following deficits and impairments:  Abnormal gait, Impaired sensation, Decreased mobility, Decreased activity tolerance, Decreased endurance, Decreased range of motion, Decreased strength, Impaired UE functional use, Decreased balance, Decreased safety awareness, Difficulty walking, Pain  Visit Diagnosis: Muscle weakness (generalized)  Other lack of coordination  Other abnormalities of gait and mobility     Problem List Patient Active Problem List   Diagnosis Date Noted  . Adhesive capsulitis of left shoulder 08/06/2017  . Urinary frequency 07/11/2017  . Gait disturbance, post-stroke 05/31/2017  . Left sp[place]iparesis ([place]/03/2017  . Adjustment disorder with mixed anxiety and depressed mood   . Hypokalemia   . HCAP (healthcare-associated pneumonia)   .  Cough with  fever   . 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 FUO (fever of unknown origin)   . Oral thrush   . Benign essential HTN   . Rheumatoid arthritis involving both hands ([place]) 04/28/2017  . 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[place]) 04/27/2017  . CVA (cerebral vascular accident) ([place]) 04/24/2017  . LGSIL Pap smear of vagina 12/15/2016  . Anal skin tag 02/02/2016  . Inflammatory arthritis 01/19/2016  . Right knee pain 01/19/2016  . Genital herpes 10/20/2015  . GERD (gastroesophageal reflux disease) 10/20/2015  . Status post vaginal hysterectomy 10/20/2015  . Health care maintenance 05/30/2015  . LLQ pain 03/31/2014  . Menopausal symptoms 03/31/2014  . Hypercholesterolemia 12/31/2012  . Hypothyroidism 12/29/2012  . Osteoarthritis 12/29/2012  . PULMONARY NODULE 01/02/2008  . Mild depression ([place]) 12/30/2007  . Essential hypertension 12/30/2007     Trotter,Margaret PT, DPT 09/20/2017, 11:59 AM  [place] MAIN [facility] SERVICES [address] [place], [place], [address] Phone: [phone]   Fax:  [phone]  Name: MIYANNA WIERSMA MRN: [id number] Date of Birth: 1946/07/08

## 2017-09-20 NOTE — Therapy (Signed)
[place] MAIN [facility] SERVICES [address] [place], [place], [address] Phone: [phone]   Fax:  [phone]  Occupational Therapy Treatment  Patient Details  Name: Donna Rhodes MRN: [id number] Date of Birth: 08/02/1946 Referring Provider: Dr. Letta Pate  Encounter Date: 09/20/2017      OT End of Session - 09/20/17 1229    Visit Number 15   Number of Visits 24   Date for OT Re-Evaluation 10/13/17   Authorization Type Medicare G Code 5 of 10   OT Start Time 1030   OT Stop Time 1115   OT Time Calculation (min) 45 min   Activity Tolerance Patient tolerated treatment well   Behavior During Therapy [facility] for tasks assessed/performed      Past Medical History:  Diagnosis Date  . Atrophic vaginitis   . Dysplasia of vagina    vin 1 - vulva and vaginal cuff  . GERD (gastroesophageal reflux disease)   . HCV infection   . HSV (herpes simplex virus) infection   . Hypercholesterolemia   . Hypertension   . Hypothyroidism   . Lung nodule    stable  . Menopausal state   . Osteoarthritis    hand involvement, cervical disc disease  . Osteoporosis   . Pelvic pain in female   . Rheumatoid arthritis (Donna Rhodes)   . Stroke (cerebrum) (Donna Rhodes) 04/24/2017  . Thyroid nodule   . Vaginal Pap smear, abnormal    lgsil    Past Surgical History:  Procedure Laterality Date  . ADENOIDECTOMY    . APPENDECTOMY  1965  . [place]   left  . CHOLECYSTECTOMY    . [place]  . laser vein surgery    . MANDIBLE RECONSTRUCTION  1992  . TOENAIL EXCISION  1998   several  . [place]  . VAGINAL HYSTERECTOMY  1983   als0- anterior/posterior colporrhaphy  . VAGINAL WOUND CLOSURE / REPAIR  1999, 2000  . VAGINAL WOUND CLOSURE / REPAIR      There were no vitals filed for this visit.      Subjective Assessment - 09/20/17 1228    Subjective  Pt. reports she is going to have her cataract surgery at the end of next month.    Patient is accompained by: Family member   Pertinent History Pt. is a 71 y.o. female who had a CVA on 04/24/2017. Pt. went to the ER when she started not feeling well, and having heart palpitations. Pt. reports having work up initially, however was sent home. Pt. reports later in the day she was unable to write with her left hand. Pt. returned to the ER, and was admitted with workup for a CVA. Pt. was admitted to [facility], and stayed for approximately 3 weeks. Pt. returned home alone, and  received home health services for the past 2 months. Pt. now presents for Outpatient OT services. Pt. resides home alone, and has support children who stop by daily, assist with shopping, transportation, and  ADL/IADL tasks as needed.    Limitations Pt. is unable to use her dominant RUE during functional tasks.   Currently in Pain? No/denies        OT TREATMENT    Therapeutic Exercise:  Pt. performed PROM for shoulder flexion, abduction, horizontal abduction, elbow flexion, and extension. Moist heat modality was applied prior to ROM for the right shoulder. Pt. tolerated wrist and digit ROM while heat was in place to  the shoulder. Pt. education was provided about positioining of the LUE for comfort, UE anatomy, and proper there. Ex movements. Pt. Presents with pain initially, however improves, and ROM progresses.  Selfcare:  Measurements were obtained for a shoulder harness brace.                            OT Education - 09/20/17 1229    Education provided Yes   Education Details UE ROM, LUE functioning   Person(s) Educated Patient   Methods Explanation;Demonstration;Verbal cues   Comprehension Verbalized understanding;Returned demonstration;Verbal cues required;Need further instruction             OT Long Term Goals - 08/24/17 1209      OT LONG TERM GOAL #1   Title Pt. will tolerate increased shoulder PROM by 20 degrees with pain improved to under 3/10    Baseline Flexion: 91, Abduction: 67   Time 12   Period Weeks   Status On-going   Target Date 10/13/17     OT LONG TERM GOAL #2   Title Pt. will formulate a full fist to be able to stabilize objects with her Left hand while using her right hand.    Baseline Digit flexion to [facility]: 2nd digit: 4cm, 3rd: 5.5, 4th: 6, 5th: 4.   Time 12   Period Weeks   Status On-going   Target Date 10/13/17     OT LONG TERM GOAL #3   Title Pt. will be able to wipe face independently with her Left UE.   Baseline Pt. is unable   Time 12   Period Weeks   Status On-going   Target Date 10/13/17     OT LONG TERM GOAL #4   Title Pt. will demonstrate independence with splint application.   Baseline Pt. is unable to donn splint    Time 12   Period Weeks   Status New   Target Date 10/13/17     OT LONG TERM GOAL #5   Title Pt. will initiate active grasp release with thumb to 2nd digit 100% of the time to be able to hold a utensil in preparation for self-feeding.   Baseline Minimal thumb movement.   Time 12   Period Weeks   Status On-going   Target Date 10/13/17     OT LONG TERM GOAL #6   Title Pt. will initiate wrist extension in preparation for functional reaching.   Baseline No active wrist extension.   Time 12   Period Weeks   Status On-going   Target Date 10/13/17     Intermittent 6/10 pain with movement in the LUE.          Plan - 09/20/17 1230    Clinical Impression Statement Pt. is having to Loop recorder procedure at the end of the week. Pt. education was provided about the anatomy of the LUE. Reviewed photos of the arm, and placement of muscles, origin, and insertion of tendons, as well as the structure of the UE. Pt. was measured for a shoulder harness brace. Pt. reports she may purchase one. Pt. pain the the UE is intermittent, and moves around the UE.   Occupational performance deficits (Please refer to evaluation for details): ADL's;IADL's   Rehab Potential Good   OT Frequency  2x / week   OT Duration 12 weeks   OT Treatment/Interventions Self-care/ADL training;DME and/or AE instruction;Energy conservation;Therapeutic exercise;Neuromuscular education;Moist Heat;Therapeutic activities;Cognitive remediation/compensation;Therapeutic exercises;Patient/family education   Clinical Decision  Making Several treatment options, min-mod task modification necessary   Consulted and Agree with Plan of Care Patient      Patient will benefit from skilled therapeutic intervention in order to improve the following deficits and impairments:  Decreased cognition, Difficulty walking, Decreased balance, Decreased strength, Decreased safety awareness, Decreased coordination, Impaired UE functional use, Impaired tone, Decreased range of motion, Decreased endurance, Decreased activity tolerance, Impaired perceived functional ability, Pain  Visit Diagnosis: Muscle weakness (generalized)    Problem List Patient Active Problem List   Diagnosis Date Noted  . Adhesive capsulitis of left shoulder 08/06/2017  . Urinary frequency 07/11/2017  . Gait disturbance, post-stroke 05/31/2017  . Left spastic hemiparesis ([place]) 05/31/2017  . Adjustment disorder with mixed anxiety and depressed mood   . Hypokalemia   . HCAP (healthcare-associated pneumonia)   . Cough with fever   . Flaccid monoplegia of upper extremity ([place])   . FUO (fever of unknown origin)   . Oral thrush   . Benign essential HTN   . Rheumatoid arthritis involving both hands ([place]) 04/28/2017  . 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[place]) 04/27/2017  . CVA (cerebral vascular accident) ([place]) 04/24/2017  . LGSIL Pap smear of vagina 12/15/2016  . Anal skin tag 02/02/2016  . Inflammatory arthritis 01/19/2016  . Right knee pain 01/19/2016  . Genital herpes 10/20/2015  . GERD (gastroesophageal reflux disease) 10/20/2015  . Status post vaginal hysterectomy 10/20/2015  . Health care maintenance 05/30/2015  . LLQ pain 03/31/2014  .  Menopausal symptoms 03/31/2014  . Hypercholesterolemia 12/31/2012  . Hypothyroidism 12/29/2012  . Osteoarthritis 12/29/2012  . PULMONARY NODULE 01/02/2008  . Mild depression ([place]) 12/30/2007  . Essential hypertension 12/30/2007    Harrel Carina, MS, OTR/L 09/20/2017, 12:35 PM  [place] MAIN [facility] SERVICES [address] [place], [place], [address] Phone: [phone]   Fax:  [phone]  Name: TALISE SLIGH MRN: [id number] Date of Birth: 1946-04-18

## 2017-09-20 NOTE — Patient Instructions (Signed)
  Repeat 5 reps with each foot in front 5 days a week.Balance: Unilateral   Attempt to balance on left leg, eyes open. Hold _5-10___ seconds.Start with holding onto counter/sink and if you get your balance you can try to let go of counter. Repeat __5__ times per set. Do __1__ sets per session. Do __1__ sessions per day. Keep eyes open:   http://orth.exer.us/29   Copyright  VHI. All rights reserved.

## 2017-09-22 ENCOUNTER — Ambulatory Visit: Payer: PPO | Admitting: Physical Therapy

## 2017-09-22 ENCOUNTER — Ambulatory Visit: Payer: PPO | Admitting: Occupational Therapy

## 2017-09-23 ENCOUNTER — Ambulatory Visit: Payer: PPO

## 2017-09-24 ENCOUNTER — Ambulatory Visit (HOSPITAL_COMMUNITY)
Admission: RE | Admit: 2017-09-24 | Discharge: 2017-09-24 | Disposition: A | Discharge status: Home / Self Care | Payer: PPO | Source: Ambulatory Visit | Attending: Internal Medicine | Admitting: Internal Medicine

## 2017-09-24 ENCOUNTER — Encounter (HOSPITAL_COMMUNITY): Payer: Self-pay | Admitting: Internal Medicine

## 2017-09-24 ENCOUNTER — Encounter (HOSPITAL_COMMUNITY)
Admission: RE | Disposition: A | Discharge status: Home / Self Care | Payer: Self-pay | Source: Ambulatory Visit | Attending: Internal Medicine

## 2017-09-24 DIAGNOSIS — E785 Hyperlipidemia, unspecified: Secondary | ICD-10-CM | POA: Diagnosis not present

## 2017-09-24 DIAGNOSIS — Z888 Allergy status to other drugs, medicaments and biological substances status: Secondary | ICD-10-CM | POA: Insufficient documentation

## 2017-09-24 DIAGNOSIS — M810 Age-related osteoporosis without current pathological fracture: Secondary | ICD-10-CM | POA: Insufficient documentation

## 2017-09-24 DIAGNOSIS — Z91018 Allergy to other foods: Secondary | ICD-10-CM | POA: Diagnosis not present

## 2017-09-24 DIAGNOSIS — Z885 Allergy status to narcotic agent status: Secondary | ICD-10-CM | POA: Diagnosis not present

## 2017-09-24 DIAGNOSIS — Z79899 Other long term (current) drug therapy: Secondary | ICD-10-CM | POA: Diagnosis not present

## 2017-09-24 DIAGNOSIS — I493 Ventricular premature depolarization: Secondary | ICD-10-CM | POA: Diagnosis not present

## 2017-09-24 DIAGNOSIS — Z881 Allergy status to other antibiotic agents status: Secondary | ICD-10-CM | POA: Insufficient documentation

## 2017-09-24 DIAGNOSIS — M069 Rheumatoid arthritis, unspecified: Secondary | ICD-10-CM | POA: Diagnosis not present

## 2017-09-24 DIAGNOSIS — Z7902 Long term (current) use of antithrombotics/antiplatelets: Secondary | ICD-10-CM | POA: Diagnosis not present

## 2017-09-24 DIAGNOSIS — K219 Gastro-esophageal reflux disease without esophagitis: Secondary | ICD-10-CM | POA: Insufficient documentation

## 2017-09-24 DIAGNOSIS — Z87891 Personal history of nicotine dependence: Secondary | ICD-10-CM | POA: Diagnosis not present

## 2017-09-24 DIAGNOSIS — I69354 Hemiplegia and hemiparesis following cerebral infarction affecting left non-dominant side: Secondary | ICD-10-CM | POA: Insufficient documentation

## 2017-09-24 DIAGNOSIS — R002 Palpitations: Secondary | ICD-10-CM | POA: Insufficient documentation

## 2017-09-24 DIAGNOSIS — E7800 Pure hypercholesterolemia, unspecified: Secondary | ICD-10-CM | POA: Insufficient documentation

## 2017-09-24 DIAGNOSIS — I491 Atrial premature depolarization: Secondary | ICD-10-CM | POA: Insufficient documentation

## 2017-09-24 DIAGNOSIS — E039 Hypothyroidism, unspecified: Secondary | ICD-10-CM | POA: Insufficient documentation

## 2017-09-24 DIAGNOSIS — Z8673 Personal history of transient ischemic attack (TIA), and cerebral infarction without residual deficits: Secondary | ICD-10-CM | POA: Diagnosis present

## 2017-09-24 DIAGNOSIS — I638 Other cerebral infarction: Secondary | ICD-10-CM | POA: Diagnosis not present

## 2017-09-24 HISTORY — PX: LOOP RECORDER INSERTION: EP1214

## 2017-09-24 SURGERY — LOOP RECORDER INSERTION

## 2017-09-24 MED ORDER — LIDOCAINE-EPINEPHRINE 1 %-1:100000 IJ SOLN
INTRAMUSCULAR | Status: AC
  Filled 2017-09-24: qty 1

## 2017-09-24 MED ORDER — LIDOCAINE-EPINEPHRINE 1 %-1:100000 IJ SOLN
INTRAMUSCULAR | Status: DC | PRN
  Administered 2017-09-24: 15 mL

## 2017-09-24 SURGICAL SUPPLY — 2 items
LOOP REVEAL LINQSYS (Prosthesis & Implant Heart) ×3 IMPLANT
PACK LOOP INSERTION (CUSTOM PROCEDURE TRAY) ×3 IMPLANT

## 2017-09-24 NOTE — Interval H&P Note (Signed)
History and Physical Interval Note:  09/24/2017 1:31 PM  Donna Rhodes  has presented today for surgery, with the diagnosis of stroke  The various methods of treatment have been discussed with the patient and family. After consideration of risks, benefits and other options for treatment, the patient has consented to  Procedure(s): LOOP RECORDER INSERTION (N/A) as a surgical intervention .  The patient's history has been reviewed, patient examined, no change in status, stable for surgery.  I have reviewed the patient's chart and labs.  Questions were answered to the patient's satisfaction.     Cristopher Peru

## 2017-09-24 NOTE — H&P (View-Only) (Signed)
HPI Donna Rhodes is referred today by Dr. Ubaldo Glassing for evaluation of cryptogenic stroke to consider insertion of an ILR. The patient sustained a stroke back in April leaving her with residual left arm, and particularly left hand deficits. She had a 2-D echo in the hospital demonstrating no obvious abnormalities except for very minimal left ventricular dysfunction with an EF of 50-55%. She has never had syncope. She denies any anginal symptoms or shortness of breath. She has palpitations and wore a cardiac monitor demonstrating both PACs and PVCs but no clear-cut atrial fibrillation. There were no pauses observed. She has tried to increase her physical activity. She has a very dense left hemiparesis of the left arm. Allergies  Allergen Reactions  . Amoxicillin Other (See Comments)    Irritated tongue  . Avelox [Moxifloxacin Hcl In Nacl]   . Azithromycin Other (See Comments)    Caused mouth and tongue to be sore  . Codeine   . [place] Weight Management [Nutritional Supplements]     Mouth soreness  . Food     Walnuts --mouth breaks out  . Moxifloxacin Other (See Comments)  . Naproxen   . Cefepime Rash     Current Outpatient Prescriptions  Medication Sig Dispense Refill  . amLODipine (NORVASC) 2.5 MG tablet Take 1 tablet by mouth daily.    Marland Kitchen atorvastatin (LIPITOR) 20 MG tablet Take 1 tablet (20 mg total) by mouth daily. 90 tablet 1  . calcium-vitamin D (OSCAL WITH D) 250-125 MG-UNIT tablet Take 1 tablet by mouth daily.    . citalopram (CELEXA) 10 MG tablet Take 10 mg by mouth daily.    . clopidogrel (PLAVIX) 75 MG tablet Take 1 tablet (75 mg total) by mouth daily. 90 tablet 1  . Famotidine (PEPCID PO) Take by mouth 2 (two) times daily.    . folic acid (FOLVITE) 1 MG tablet Take 1 mg by mouth daily.    Marland Kitchen levothyroxine (SYNTHROID, LEVOTHROID) 50 MCG tablet Take 1 tablet (50 mcg total) by mouth daily before breakfast. 90 tablet 1  . methotrexate 50 MG/2ML injection Inject 0.24mL under  the skin once a week.    . pantoprazole (PROTONIX) 40 MG tablet Take 40 mg by mouth daily.     No current facility-administered medications for this visit.      Past Medical History:  Diagnosis Date  . Atrophic vaginitis   . Dysplasia of vagina    vin 1 - vulva and vaginal cuff  . GERD (gastroesophageal reflux disease)   . HCV infection   . HSV (herpes simplex virus) infection   . Hypercholesterolemia   . Hypertension   . Hypothyroidism   . Lung nodule    stable  . Menopausal state   . Osteoarthritis    hand involvement, cervical disc disease  . Osteoporosis   . Pelvic pain in female   . Rheumatoid arthritis ([place])   . Stroke (cerebrum) ([place]) 04/24/2017  . Thyroid nodule   . Vaginal Pap smear, abnormal    lgsil    ROS:   All systems reviewed and negative except as noted in the HPI.   Past Surgical History:  Procedure Laterality Date  . ADENOIDECTOMY    . APPENDECTOMY  1965  . [place]   left  . CHOLECYSTECTOMY    . [place]  . laser vein surgery    . MANDIBLE RECONSTRUCTION  1992  . TOENAIL EXCISION  1998   several  . TUBAL  LIGATION  1977  . VAGINAL HYSTERECTOMY  1983   als0- anterior/posterior colporrhaphy  . VAGINAL WOUND CLOSURE / REPAIR  1999, 2000  . VAGINAL WOUND CLOSURE / REPAIR       Family History  Problem Relation Age of Onset  . Hypertension Father   . Dementia Father   . Osteoarthritis Mother   . Arthritis/Rheumatoid Sister   . Breast cancer Unknown        paternal side  . Breast cancer Paternal Aunt   . Breast cancer Cousin        maternal  . Uterine cancer Maternal Grandmother   . Ovarian cancer Neg Hx   . Heart disease Neg Hx   . Diabetes Neg Hx   . Colon cancer Neg Hx      Social History   Social History  . Marital status: Divorced    Spouse name: N/A  . Number of children: 3  . Years of education: 12   Occupational History  .      retired   Social History Main Topics  . Smoking status:  Former Smoker    Quit date: 12/29/1979  . Smokeless tobacco: Never Used  . Alcohol use No  . Drug use: No  . Sexual activity: Not Currently    Birth control/ protection: Surgical   Other Topics Concern  . Not on file   Social History Narrative   Lives alone   Caffeine- 1 cup coffee     BP 135/84   Pulse 66   Ht 5\' 4"  (1.626 m)   Wt 111 lb (50.3 kg)   LMP 12/29/1981   SpO2 97%   BMI 19.05 kg/m   Physical Exam:  Well appearing 71 year old woman, NAD HEENT: Unremarkable Neck:  No JVD, no thyromegally Lymphatics:  No adenopathy Back:  No CVA tenderness Lungs:  Clear, with no wheezes, rales, or rhonchi HEART:  Regular rate rhythm, no murmurs, no rubs, no clicks Abd:  soft, positive bowel sounds, no organomegally, no rebound, no guarding Ext:  2 plus pulses, no edema, no cyanosis, no clubbing Skin:  No rashes no nodules Neuro:  CN II through XII intact, motor grossly intact, except for left arm and left hand weakness which is profound. Reviewed sinus rhythm  EKG -  none today, previously sinus rhythm   Assess/Plan: 1. Cryptogenic stroke - I discussed the treatment options with the patient and her daughter. I recommended proceeding with an insertion of an implantable loop recorder. The risk and benefits, goals and expectations of the procedure have been discussed and she wishes to proceed. 2. Palpitations - she has evidence of PVCs and PACs but no clear-cut atrial fibrillation. She will continue her current medical therapy. Additional recommendations will be based on the results of her implantable loop recorder. 3. Hypertension - her blood pressure is minimally elevated. She is on low-dose amlodipine. If it remains elevated, would consider up titration of this medication. 4. Dyslipidemia - she will continue atorvastatin. Follow labs.  Cristopher Peru, M.D.

## 2017-09-24 NOTE — Discharge Instructions (Signed)
°  LOOP RECORDER INSTRUCTIONS GIVEN

## 2017-09-27 ENCOUNTER — Ambulatory Visit: Payer: PPO | Attending: Physical Medicine & Rehabilitation | Admitting: Occupational Therapy

## 2017-09-27 DIAGNOSIS — M6281 Muscle weakness (generalized): Secondary | ICD-10-CM

## 2017-09-27 DIAGNOSIS — R2689 Other abnormalities of gait and mobility: Secondary | ICD-10-CM | POA: Insufficient documentation

## 2017-09-27 DIAGNOSIS — R278 Other lack of coordination: Secondary | ICD-10-CM | POA: Insufficient documentation

## 2017-09-27 NOTE — Therapy (Signed)
[place] MAIN [facility] SERVICES [address] [place], [place], [address] Phone: [phone]   Fax:  [phone]  Occupational Therapy Treatment  Patient Details  Name: Donna Rhodes MRN: [id number] Date of Birth: 1946/04/06 Referring Provider: Dr. Letta Pate  Encounter Date: 09/27/2017      OT End of Session - 09/27/17 1248    Visit Number 16   Number of Visits 24   Date for OT Re-Evaluation 10/13/17   Authorization Type Medicare G Code 6 of 10   OT Start Time 1115   OT Stop Time 1200   OT Time Calculation (min) 45 min   Activity Tolerance Patient tolerated treatment well   Behavior During Therapy [facility] for tasks assessed/performed      Past Medical History:  Diagnosis Date  . Atrophic vaginitis   . Dysplasia of vagina    vin 1 - vulva and vaginal cuff  . GERD (gastroesophageal reflux disease)   . HCV infection   . HSV (herpes simplex virus) infection   . Hypercholesterolemia   . Hypertension   . Hypothyroidism   . Lung nodule    stable  . Menopausal state   . Osteoarthritis    hand involvement, cervical disc disease  . Osteoporosis   . Pelvic pain in female   . Rheumatoid arthritis ([place])   . Stroke (cerebrum) ([place]) 04/24/2017  . Thyroid nodule   . Vaginal Pap smear, abnormal    lgsil    Past Surgical History:  Procedure Laterality Date  . ADENOIDECTOMY    . APPENDECTOMY  1965  . [place]   left  . CHOLECYSTECTOMY    . [place]  . laser vein surgery    . LOOP RECORDER INSERTION N/A 09/24/2017   Procedure: LOOP RECORDER INSERTION;  Surgeon: Evans Lance, MD;  Location: [place] [facility];  Service: Cardiovascular;  Laterality: N/A;  . MANDIBLE RECONSTRUCTION  1992  . TOENAIL EXCISION  1998   several  . [place]  . VAGINAL HYSTERECTOMY  1983   als0- anterior/posterior colporrhaphy  . VAGINAL WOUND CLOSURE / REPAIR  1999, 2000  . VAGINAL WOUND CLOSURE / REPAIR      There  were no vitals filed for this visit.      Subjective Assessment - 09/27/17 1245    Subjective  Pt. reports she had the loop recorder inserted last week.   Patient is accompained by: Family member   Pertinent History Pt. is a 71 y.o. female who had a CVA on 04/24/2017. Pt. went to the ER when she started not feeling well, and having heart palpitations. Pt. reports having work up initially, however was sent home. Pt. reports later in the day she was unable to write with her left hand. Pt. returned to the ER, and was admitted with workup for a CVA. Pt. was admitted to [facility], and stayed for approximately 3 weeks. Pt. returned home alone, and  received home health services for the past 2 months. Pt. now presents for Outpatient OT services. Pt. resides home alone, and has support children who stop by daily, assist with shopping, transportation, and  ADL/IADL tasks as needed.    Currently in Pain? Yes   Pain Location Arm  Pain location changes in the arm.   Pain Descriptors / Indicators Aching   Pain Type Chronic pain   Pain Onset More than a month ago   Pain Frequency Intermittent  OT TREATMENT    Therapeutic Exercise:  Pt. tolerated PROM/AAROM in all joint ranges of the LUE including: shoulder flexion, abduction, horizontal abduction, elbow flexion, extension, forearm supination, wrist extension, thumb abduction, digit extension. Pt. Performed AROM for elbow flexion, extension, forearm supination.Pt. worked on hand to face patterns with her right hand in preparation for light grooming.   Manual Therapy:  Pt. performed scapular mobilizations for scapular elevation, depression, abduction/adduction in sidelying, andsitting.Scapular mobes were performed to help normalize tone, and prepare UE for ROM and functional use.                          OT Education - 09/27/17 1247    Education provided Yes   Education Details UE ROM, LUE functioning.    Person(s) Educated Patient   Methods Explanation;Demonstration;Verbal cues   Comprehension Verbalized understanding;Returned demonstration;Verbal cues required;Need further instruction             OT Long Term Goals - 08/24/17 1209      OT LONG TERM GOAL #1   Title Pt. will tolerate increased shoulder PROM by 20 degrees with pain improved to under 3/10   Baseline Flexion: 91, Abduction: 67   Time 12   Period Weeks   Status On-going   Target Date 10/13/17     OT LONG TERM GOAL #2   Title Pt. will formulate a full fist to be able to stabilize objects with her Left hand while using her right hand.    Baseline Digit flexion to [facility]: 2nd digit: 4cm, 3rd: 5.5, 4th: 6, 5th: 4.   Time 12   Period Weeks   Status On-going   Target Date 10/13/17     OT LONG TERM GOAL #3   Title Pt. will be able to wipe face independently with her Left UE.   Baseline Pt. is unable   Time 12   Period Weeks   Status On-going   Target Date 10/13/17     OT LONG TERM GOAL #4   Title Pt. will demonstrate independence with splint application.   Baseline Pt. is unable to donn splint    Time 12   Period Weeks   Status New   Target Date 10/13/17     OT LONG TERM GOAL #5   Title Pt. will initiate active grasp release with thumb to 2nd digit 100% of the time to be able to hold a utensil in preparation for self-feeding.   Baseline Minimal thumb movement.   Time 12   Period Weeks   Status On-going   Target Date 10/13/17     OT LONG TERM GOAL #6   Title Pt. will initiate wrist extension in preparation for functional reaching.   Baseline No active wrist extension.   Time 12   Period Weeks   Status On-going   Target Date 10/13/17               Plan - 09/27/17 1249    Clinical Impression Statement Pt. had the loop recorder procedure last week. Pt. reports she can now take a shower later today. Pt. continues to present with pain, and increased tone in the LUE. Pt.'s pain improves with ROM. Pt.  continues to work on improving pain, ROM, tone, motor control, and coordination skills in order to improve engagement in ADL, and IADL tasks.   Occupational performance deficits (Please refer to evaluation for details): ADL's;IADL's   Rehab Potential Good   OT Frequency 2x /  week   OT Duration 12 weeks   OT Treatment/Interventions Self-care/ADL training;DME and/or AE instruction;Energy conservation;Therapeutic exercise;Neuromuscular education;Moist Heat;Therapeutic activities;Cognitive remediation/compensation;Therapeutic exercises;Patient/family education   Consulted and Agree with Plan of Care Patient      Patient will benefit from skilled therapeutic intervention in order to improve the following deficits and impairments:  Decreased cognition, Difficulty walking, Decreased balance, Decreased strength, Decreased safety awareness, Decreased coordination, Impaired UE functional use, Impaired tone, Decreased range of motion, Decreased endurance, Decreased activity tolerance, Impaired perceived functional ability, Pain  Visit Diagnosis: Muscle weakness (generalized)    Problem List Patient Active Problem List   Diagnosis Date Noted  . Adhesive capsulitis of left shoulder 08/06/2017  . Urinary frequency 07/11/2017  . Gait disturbance, post-stroke 05/31/2017  . Left spastic hemiparesis ([place]) 05/31/2017  . Adjustment disorder with mixed anxiety and depressed mood   . Hypokalemia   . HCAP (healthcare-associated pneumonia)   . Cough with fever   . 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 FUO (fever of unknown origin)   . Oral thrush   . Benign essential HTN   . Rheumatoid arthritis involving both hands ([place]) 04/28/2017  . Right-sided lacunar infarction 04/27/2017  . CVA (cerebral vascular accident) ([place]) 04/24/2017  . LGSIL Pap smear of vagina 12/15/2016  . Anal skin tag 02/02/2016  . Inflammatory arthritis 01/19/2016  . Right knee pain 01/19/2016  . Genital herpes 10/20/2015   . GERD (gastroesophageal reflux disease) 10/20/2015  . Status post vaginal hysterectomy 10/20/2015  . Health care maintenance 05/30/2015  . LLQ pain 03/31/2014  . Menopausal symptoms 03/31/2014  . Hypercholesterolemia 12/31/2012  . Hypothyroidism 12/29/2012  . Osteoarthritis 12/29/2012  . PULMONARY NODULE 01/02/2008  . Mild depression ([place]) 12/30/2007  . Essential hypertension 12/30/2007    Harrel Carina, MS, OTR/L 09/27/2017, 1:01 PM  [place] MAIN [facility] SERVICES [address] [place], [place], [address] Phone: [phone]   Fax:  [phone]  Name: Donna Rhodes MRN: [id number] Date of Birth: Aug 23, 1946

## 2017-09-29 ENCOUNTER — Encounter: Payer: PPO | Admitting: Occupational Therapy

## 2017-09-29 ENCOUNTER — Other Ambulatory Visit: Payer: Self-pay | Admitting: Obstetrics and Gynecology

## 2017-09-29 ENCOUNTER — Ambulatory Visit: Payer: PPO | Admitting: Physical Therapy

## 2017-09-29 ENCOUNTER — Encounter: Payer: Self-pay | Admitting: Physical Therapy

## 2017-09-29 ENCOUNTER — Ambulatory Visit: Payer: PPO | Admitting: Occupational Therapy

## 2017-09-29 DIAGNOSIS — M6281 Muscle weakness (generalized): Secondary | ICD-10-CM | POA: Diagnosis not present

## 2017-09-29 DIAGNOSIS — R278 Other lack of coordination: Secondary | ICD-10-CM

## 2017-09-29 DIAGNOSIS — R2689 Other abnormalities of gait and mobility: Secondary | ICD-10-CM

## 2017-09-29 NOTE — Therapy (Signed)
[place] MAIN [facility] SERVICES [address] [place], [place], [address] Phone: [phone]   Fax:  [phone]  Occupational Therapy Treatment  Patient Details  Name: Donna Rhodes MRN: [id number] Date of Birth: [birth year] Referring Provider: Dr. Letta Pate  Encounter Date: 09/29/2017      OT End of Session - 09/29/17 1335    Visit Number 17   Number of Visits 24   Date for OT Re-Evaluation 10/13/17   Authorization Type Medicare G Code 7 of 10   OT Start Time 1100   OT Stop Time 1145   OT Time Calculation (min) 45 min   Activity Tolerance Patient tolerated treatment well   Behavior During Therapy [facility] for tasks assessed/performed      Past Medical History:  Diagnosis Date  . Atrophic vaginitis   . Dysplasia of vagina    vin 1 - vulva and vaginal cuff  . GERD (gastroesophageal reflux disease)   . HCV infection   . HSV (herpes simplex virus) infection   . Hypercholesterolemia   . Hypertension   . Hypothyroidism   . Lung nodule    stable  . Menopausal state   . Osteoarthritis    hand involvement, cervical disc disease  . Osteoporosis   . Pelvic pain in female   . Rheumatoid arthritis ([place])   . Stroke (cerebrum) ([place]) 04/24/2017  . Thyroid nodule   . Vaginal Pap smear, abnormal    lgsil    Past Surgical History:  Procedure Laterality Date  . ADENOIDECTOMY    . APPENDECTOMY  1965  . [place]   left  . CHOLECYSTECTOMY    . [place]  . laser vein surgery    . LOOP RECORDER INSERTION N/A 09/24/2017   Procedure: LOOP RECORDER INSERTION;  Surgeon: Evans Lance, MD;  Location: [place] CV LAB;  Service: Cardiovascular;  Laterality: N/A;  . MANDIBLE RECONSTRUCTION  1992  . TOENAIL EXCISION  1998   several  . [place]  . VAGINAL HYSTERECTOMY  1983   als0- anterior/posterior colporrhaphy  . VAGINAL WOUND CLOSURE / REPAIR  1999, 2000  . VAGINAL WOUND CLOSURE / REPAIR      There  were no vitals filed for this visit.      Subjective Assessment - 09/29/17 1333    Subjective  Pt. reports less intervals of shoulder pain today.   Patient is accompained by: Family member   Pertinent History Pt. is a 71 y.o. female who had a CVA on 04/24/2017. Pt. went to the ER when she started not feeling well, and having heart palpitations. Pt. reports having work up initially, however was sent home. Pt. reports later in the day she was unable to write with her left hand. Pt. returned to the ER, and was admitted with workup for a CVA. Pt. was admitted to [facility], and stayed for approximately 3 weeks. Pt. returned home alone, and  received home health services for the past 2 months. Pt. now presents for Outpatient OT services. Pt. resides home alone, and has support children who stop by daily, assist with shopping, transportation, and  ADL/IADL tasks as needed.    Patient Stated Goals To get back the way she was. (Independent)   Currently in Pain? No/denies   Pain Score 7       OT TREATMENT   Therapeutic Exercise:   Pt. tolerated PROM/AAROM in all joint ranges of the LUE including:  shoulder flexion, abduction, horizontal abduction, elbow flexion, extension, forearm supination, wrist extension, thumb abduction, digit extension. Pt. Performed AROM for elbow flexion, extension, forearm supination.Pt. worked on hand to face patterns with her right hand in preparation for light grooming. Pt. Tolerated moist heat modality to the right shoulder prior to proximal ROM during distal ROM.  Manual Therapy:  Pt. performed scapular mobilizations for scapular elevation, depression, abduction/adduction in sidelying, andsitting.Scapular mobes were performed to help normalize tone, and prepare UE for ROM and functional use.                              OT Education - 09/29/17 1335    Education provided Yes   Education Details LUE positioning, Botox,  and ROM   Person(s) Educated Patient   Methods Explanation;Demonstration;Verbal cues   Comprehension Verbalized understanding;Returned demonstration;Verbal cues required;Need further instruction             OT Long Term Goals - 08/24/17 1209      OT LONG TERM GOAL #1   Title Pt. will tolerate increased shoulder PROM by 20 degrees with pain improved to under 3/10   Baseline Flexion: 91, Abduction: 67   Time 12   Period Weeks   Status On-going   Target Date 10/13/17     OT LONG TERM GOAL #2   Title Pt. will formulate a full fist to be able to stabilize objects with her Left hand while using her right hand.    Baseline Digit flexion to [facility]: 2nd digit: 4cm, 3rd: 5.5, 4th: 6, 5th: 4.   Time 12   Period Weeks   Status On-going   Target Date 10/13/17     OT LONG TERM GOAL #3   Title Pt. will be able to wipe face independently with her Left UE.   Baseline Pt. is unable   Time 12   Period Weeks   Status On-going   Target Date 10/13/17     OT LONG TERM GOAL #4   Title Pt. will demonstrate independence with splint application.   Baseline Pt. is unable to donn splint    Time 12   Period Weeks   Status New   Target Date 10/13/17     OT LONG TERM GOAL #5   Title Pt. will initiate active grasp release with thumb to 2nd digit 100% of the time to be able to hold a utensil in preparation for self-feeding.   Baseline Minimal thumb movement.   Time 12   Period Weeks   Status On-going   Target Date 10/13/17     OT LONG TERM GOAL #6   Title Pt. will initiate wrist extension in preparation for functional reaching.   Baseline No active wrist extension.   Time 12   Period Weeks   Status On-going   Target Date 10/13/17               Plan - 09/29/17 1336    Clinical Impression Statement Pt. reports less intervals of pain today. Pt. presents with increased flexor tone throughout the LUE. Pt. tolerated LUE ROM well today.  Pt. continues to benefit from skilled OT services  to improve UE functioning for ADLs, and IADLs.    Occupational performance deficits (Please refer to evaluation for details): ADL's;IADL's   Rehab Potential Good   OT Frequency 2x / week   OT Duration 12 weeks   OT Treatment/Interventions Self-care/ADL training;DME and/or AE instruction;Energy conservation;Therapeutic  exercise;Neuromuscular education;Moist Heat;Therapeutic activities;Cognitive remediation/compensation;Therapeutic exercises;Patient/family education   Consulted and Agree with Plan of Care Patient      Patient will benefit from skilled therapeutic intervention in order to improve the following deficits and impairments:  Decreased cognition, Difficulty walking, Decreased balance, Decreased strength, Decreased safety awareness, Decreased coordination, Impaired UE functional use, Impaired tone, Decreased range of motion, Decreased endurance, Decreased activity tolerance, Impaired perceived functional ability, Pain  Visit Diagnosis: Muscle weakness (generalized)    Problem List Patient Active Problem List   Diagnosis Date Noted  . Adhesive capsulitis of left shoulder 08/06/2017  . Urinary frequency 07/11/2017  . Gait disturbance, post-stroke 05/31/2017  . Left spastic hemiparesis ([place]) 05/31/2017  . Adjustment disorder with mixed anxiety and depressed mood   . Hypokalemia   . HCAP (healthcare-associated pneumonia)   . Cough with fever   . 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 FUO (fever of unknown origin)   . Oral thrush   . Benign essential HTN   . Rheumatoid arthritis involving both hands ([place]) 04/28/2017  . Right-sided lacunar infarction 04/27/2017  . CVA (cerebral vascular accident) ([place]) 04/24/2017  . LGSIL Pap smear of vagina 12/15/2016  . Anal skin tag 02/02/2016  . Inflammatory arthritis 01/19/2016  . Right knee pain 01/19/2016  . Genital herpes 10/20/2015  . GERD (gastroesophageal reflux disease) 10/20/2015  . Status post vaginal hysterectomy  10/20/2015  . Health care maintenance 05/30/2015  . LLQ pain 03/31/2014  . Menopausal symptoms 03/31/2014  . Hypercholesterolemia 12/31/2012  . Hypothyroidism 12/29/2012  . Osteoarthritis 12/29/2012  . PULMONARY NODULE 01/02/2008  . Mild depression ([place]) 12/30/2007  . Essential hypertension 12/30/2007    Harrel Carina, MS, OTR/L 09/29/2017, 1:53 PM  [place] MAIN [facility] SERVICES [address] [place], [place], [address] Phone: [phone]   Fax:  [phone]  Name: Donna Rhodes MRN: [id number] Date of Birth: 02/10/1946

## 2017-09-29 NOTE — Therapy (Signed)
[place] MAIN [facility] SERVICES [address] [place], [place], [address] Phone: [phone]   Fax:  [phone]  Physical Therapy Treatment  Patient Details  Name: Donna Rhodes MRN: [id number] Date of Birth: [birth year] Referring Provider: Alysia Penna  Encounter Date: 09/29/2017      PT End of Session - 09/29/17 1023    Visit Number 15   Number of Visits 41   Date for PT Re-Evaluation 12/08/17   Authorization Type gcode 5   Authorization Time Period 10   PT Start Time 1015   PT Stop Time 1100   PT Time Calculation (min) 45 min   Equipment Utilized During Treatment Gait belt   Activity Tolerance Patient tolerated treatment well   Behavior During Therapy [facility] for tasks assessed/performed      Past Medical History:  Diagnosis Date  . Atrophic vaginitis   . Dysplasia of vagina    vin 1 - vulva and vaginal cuff  . GERD (gastroesophageal reflux disease)   . HCV infection   . HSV (herpes simplex virus) infection   . Hypercholesterolemia   . Hypertension   . Hypothyroidism   . Lung nodule    stable  . Menopausal state   . Osteoarthritis    hand involvement, cervical disc disease  . Osteoporosis   . Pelvic pain in female   . Rheumatoid arthritis ([place])   . Stroke (cerebrum) ([place]) 04/24/2017  . Thyroid nodule   . Vaginal Pap smear, abnormal    lgsil    Past Surgical History:  Procedure Laterality Date  . ADENOIDECTOMY    . APPENDECTOMY  1965  . [place]   left  . CHOLECYSTECTOMY    . [place]  . laser vein surgery    . LOOP RECORDER INSERTION N/A 09/24/2017   Procedure: LOOP RECORDER INSERTION;  Surgeon: Evans Lance, MD;  Location: [place] CV LAB;  Service: Cardiovascular;  Laterality: N/A;  . MANDIBLE RECONSTRUCTION  1992  . TOENAIL EXCISION  1998   several  . [place]  . VAGINAL HYSTERECTOMY  1983   als0- anterior/posterior colporrhaphy  . VAGINAL WOUND CLOSURE /  REPAIR  1999, 2000  . VAGINAL WOUND CLOSURE / REPAIR      There were no vitals filed for this visit.      Subjective Assessment - 09/29/17 1021    Subjective Patient reports still having soreness in LLE when she stretches out. "My leg keeps pulling. I don't know if that is from the stroke or not."    Patient is accompained by: Family member   Pertinent History Pt is a 71 y.o female that presents to therapy s/p CVA; On April 24, 2017 pt suffered an acute infarct in right basal ganglia. She was in hospital 3 days; after pt was transferred to [facility] where she did inpatient therapy; Pt then completed home PT until about 2 weeks ago; Pt has pain in shoulder when moving it with R UE and reports that deltoid is paralyzed; Pt fell out of bed one day and fell on L UE; Pt reported doctor put a shot in L shoulder and will receive 3 more shots in shoulder; Pt reports no sensation or numbness and tingling with LE just some sensation impaired in L hand; Pt has an air cast for L LE and splint for wrist but does not use because difficulty donning;  Pt has had trouble sleeping at night with waking  and going to the bathroom but denies any pain;  Only has pain in shoulder when moving described as throbbing; Pt reports not using her cane in the last month and states her balance is fine without it;    Limitations Lifting;Standing;Walking   How long can you sit comfortably? hours    How long can you stand comfortably? 30 min    How long can you walk comfortably? better in the morning because more energy;    Diagnostic tests x rays in shoulder and hand; all clear; doctor stated "inferior pseudo-subluxation from paralysis of the deltoid. she has advanced left hand arthritis"   Patient Stated Goals walking better without the cane;    Currently in Pain? Yes   Pain Score 6    Pain Location Leg   Pain Orientation Left   Pain Descriptors / Indicators Aching;Tightness   Pain Type Chronic pain;Neuropathic pain   Pain  Onset More than a month ago   Pain Frequency Intermittent   Aggravating Factors  worse with stretching leg out   Pain Relieving Factors less pain with movement/stretching   Effect of Pain on Daily Activities decreased tolerance with lying down/sleeping;    Multiple Pain Sites No           TREATMENT: Patient supine: PT performed passive SLR hamstring stretch with ankle DF to improve flexibility 15 sec hold x2 reps;  PT performed passive hamstring neural stretch 15 sec hold x3, LLE only with cues to relax during stretch for better flexibility;  LLE single leg bridge 2x15 with cues for positioning and to keep arms across chest for better hip strengthening;  LLE hip abduction/ER green tband x15 with min A to hold RLE to isolate LLE movement for better strengthening; PT instructed patient in PNF LLE hip D1/D2 flexion/extension with manual resistance 2x5 each with cues for positioning and to increase ROM for better strengthening;  Right sidelying; LLE clamshells green tband 2x10 with cues for positioning (stay rolled on right side) for better hip abductor strengthening; Patient able to do exercise through partial ROM; She reports soreness in posterior LLE during clamshells with no discomfort or strengthening in hip abductors; LLE hip abductor SLR 2x10 reps with min A to keep knee straight and cues to improve positioning for strengthening;  PT performed soft tissue massage with rolling stick to left hamstring x5 min to improve tissue extensibility; Patient reports increased soreness and tenderness along lateral tendon;   Instructed patient in hamstring stretch to reduce tightness: Long sitting hamstring stretch 20 sec hold x2 LLE only; Standing LLE hamstring stretch with foot on step 20 sec hold x2;  Advanced HEP- see patient instructions;   Seated: BLE hip adduction ball squeeze x20 reps with cues to increase LLE hip adduction for better LLE hip strengthening;                      PT Education - 09/29/17 1023    Education provided Yes   Education Details LLE strengthening, gait safety, HEP reinforced;    Person(s) Educated Patient   Methods Explanation;Demonstration;Verbal cues   Comprehension Verbalized understanding;Returned demonstration;Verbal cues required;Need further instruction             PT Long Term Goals - 09/15/17 1143      PT LONG TERM GOAL #1   Title Pt will become independent with HEP in order to improve ADL independence;   Baseline Pt performs several exercises frequently, but inconsistently performs other exercises.  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 on 8/28   Time 12   Period Weeks   Status Revised   Target Date 12/08/17     PT LONG TERM GOAL #4   Title Pt will improve 6MWT to >1300 feet in order to be considered 80% of age group norm and to improve activity tolerance and community ambulation distance for improved IADLs (revised from 1000")   Baseline 565 feet, improved to 875' on 8/28, improved to 1105' on 9/19   Time 12   Period Weeks   Status Revised   Target Date 12/08/17     PT LONG TERM GOAL #5   Title Pt will increase strength in L LE to 4+/5 for all muscles in order to improve mobility and function with daily activities;    Baseline pt is 4+/5 in all LE except for L hip abduction: 4/5   Time 12   Period Weeks    Status Partially Met   Target Date 12/08/17     PT LONG TERM GOAL #6   Title Pt will ambulate with equal stride length, no toe drag on LLE, and equal stance and swing time bilaterally to demonstrate improved gait mechanics for more efficient and functional mobility.   Time 12   Period Weeks   Status New   Target Date 12/08/17               Plan - 09/29/17 1103    Clinical Impression Statement Patient instructed in advanced LLE strengthening. She reported increased soreness along left hamstring during exercise, especially with hip abductor. PT performed extensive stretches and soft tissue massage to reduce tightness. Patient reported less discomfort at end of session. Advanced HEP with LE stretches. Patient would benefit from additional skilled PT intervention to improve LE ROM/strength and improve gait safety;    Rehab Potential Fair   Clinical Impairments Affecting Rehab Potential neg: age, lives alone, comorbidities, pos: wants to be more independent; already high functioning    PT Frequency 2x / week   PT Duration 12 weeks   PT Treatment/Interventions ADLs/Self Care Home Management;Aquatic Therapy;Moist Heat;Cryotherapy;DME Instruction;Gait training;Stair training;Functional mobility training;Therapeutic activities;Therapeutic exercise;Balance training;Neuromuscular re-education;Manual techniques;Passive range of motion;Energy conservation;Electrical Stimulation;Patient/family education;Orthotic Fit/Training   PT Next Visit Plan berg; stairs; strengthening, Assess adductor tightness   PT Home Exercise Plan continue as given;    Consulted and Agree with Plan of Care Patient      Patient will benefit from skilled therapeutic intervention in order to improve the following deficits and impairments:  Abnormal gait, Impaired sensation, Decreased mobility, Decreased activity tolerance, Decreased endurance, Decreased range of motion, Decreased strength, Impaired UE functional use,  Decreased balance, Decreased safety awareness, Difficulty walking, Pain  Visit Diagnosis: Muscle  weakness (generalized)  Other lack of coordination  Other abnormalities of gait and mobility     Problem List Patient Active Problem List   Diagnosis Date Noted  . Adhesive capsulitis of left shoulder 08/06/2017  . Urinary frequency 07/11/2017  . Gait disturbance, post-stroke 05/31/2017  . Left spastic hemiparesis ([place]) 05/31/2017  . Adjustment disorder with mixed anxiety and depressed mood   . Hypokalemia   . HCAP (healthcare-associated pneumonia)   . Cough with fever   . 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 FUO (fever of unknown origin)   . Oral thrush   . Benign essential HTN   . Rheumatoid arthritis involving both hands ([place]) 04/28/2017  . Right-sided lacunar infarction 04/27/2017  . CVA (cerebral vascular accident) ([place]) 04/24/2017  . LGSIL Pap smear of vagina 12/15/2016  . Anal skin tag 02/02/2016  . Inflammatory arthritis 01/19/2016  . Right knee pain 01/19/2016  . Genital herpes 10/20/2015  . GERD (gastroesophageal reflux disease) 10/20/2015  . Status post vaginal hysterectomy 10/20/2015  . Health care maintenance 05/30/2015  . LLQ pain 03/31/2014  . Menopausal symptoms 03/31/2014  . Hypercholesterolemia 12/31/2012  . Hypothyroidism 12/29/2012  . Osteoarthritis 12/29/2012  . PULMONARY NODULE 01/02/2008  . Mild depression ([place]) 12/30/2007  . Essential hypertension 12/30/2007    Donna Rhodes PT, DPT 09/29/2017, 11:06 AM  [place] MAIN [facility] SERVICES [address] [place], [place], [address] Phone: [phone]   Fax:  [phone]  Name: Donna Rhodes MRN: [id number] Date of Birth: 08/11/1946

## 2017-09-29 NOTE — Patient Instructions (Addendum)
Hamstring Stretch    Reach down along left leg until a comfortable stretch is felt in back of thigh. Be sure to keep knee straight. (Do this sitting on your bed or couch with right leg hanging off side)Hold _20___ seconds. Repeat _2___ times per set. Do __2__ sets per session. Do ___2_ sessions per day.  http://orth.exer.us/157   Copyright  VHI. All rights reserved.  Hamstring Stretch (Standing)    Standing, place one heel on chair or bench. Use one or both hands on thigh for support. Keeping torso straight, lean forward slowly until a stretch is felt in back of same thigh. Hold onto counter for balance; or you can do this with your bottom step at home.  Hold __20__ seconds. Repeat with other leg.  Copyright  VHI. All rights reserved.

## 2017-10-04 ENCOUNTER — Ambulatory Visit: Payer: PPO | Admitting: Occupational Therapy

## 2017-10-04 DIAGNOSIS — M6281 Muscle weakness (generalized): Secondary | ICD-10-CM

## 2017-10-04 NOTE — Therapy (Signed)
[place] MAIN [facility] SERVICES [address] [place], [place], [address] Phone: [phone]   Fax:  [phone]  Occupational Therapy Treatment  Patient Details  Name: Donna Rhodes MRN: [id number] Date of Birth: 1946/05/21 Referring Provider: Dr. Letta Pate  Encounter Date: 10/04/2017      OT End of Session - 10/04/17 1205    Visit Number 18   Number of Visits 24   Date for OT Re-Evaluation 10/13/17   Authorization Type Medicare G Code 8 of 10   OT Start Time 1055   OT Stop Time 1140   OT Time Calculation (min) 45 min   Activity Tolerance Patient tolerated treatment well   Behavior During Therapy [facility] for tasks assessed/performed      Past Medical History:  Diagnosis Date  . Atrophic vaginitis   . Dysplasia of vagina    vin 1 - vulva and vaginal cuff  . GERD (gastroesophageal reflux disease)   . HCV infection   . HSV (herpes simplex virus) infection   . Hypercholesterolemia   . Hypertension   . Hypothyroidism   . Lung nodule    stable  . Menopausal state   . Osteoarthritis    hand involvement, cervical disc disease  . Osteoporosis   . Pelvic pain in female   . Rheumatoid arthritis ([place])   . 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 Thyroid nodule   . Vaginal Pap smear, abnormal    lgsil    Past Surgical History:  Procedure Laterality Date  . ADENOIDECTOMY    . APPENDECTOMY  1965  . [place]   left  . CHOLECYSTECTOMY    . [place]  . laser vein surgery    . LOOP RECORDER INSERTION N/A 09/24/2017   Procedure: LOOP RECORDER INSERTION;  Surgeon: Evans Lance, MD;  Location: [place] [facility];  Service: Cardiovascular;  Laterality: N/A;  . MANDIBLE RECONSTRUCTION  1992  . TOENAIL EXCISION  1998   several  . [place]  . VAGINAL HYSTERECTOMY  1983   als0- anterior/posterior colporrhaphy  . VAGINAL WOUND CLOSURE / REPAIR  1999, 2000  . VAGINAL WOUND CLOSURE / REPAIR      There  were no vitals filed for this visit.      Subjective Assessment - 10/04/17 1204    Subjective  Pt. was late for the session today.   Patient is accompained by: Family member   Pertinent History Pt. is a 71 y.o. female who had a CVA on 04/24/2017. Pt. went to the ER when she started not feeling well, and having heart palpitations. Pt. reports having work up initially, however was sent home. Pt. reports later in the day she was unable to write with her left hand. Pt. returned to the ER, and was admitted with workup for a CVA. Pt. was admitted to [facility], and stayed for approximately 3 weeks. Pt. returned home alone, and  received home health services for the past 2 months. Pt. now presents for Outpatient OT services. Pt. resides home alone, and has support children who stop by daily, assist with shopping, transportation, and  ADL/IADL tasks as needed.    Limitations Pt. is unable to use her dominant RUE during functional tasks.   Patient Stated Goals To get back the way she was. (Independent)   Currently in Pain? Yes   Pain Score 6    Pain Location Arm   Pain Orientation Left  Pain Descriptors / Indicators Aching;Sore   Pain Type Chronic pain;Neuropathic pain            [facility] OT Assessment - 10/04/17 1212      PROM   Overall PROM Comments LUE: shoulder flexion: 108 with 6/10 pain at end range, abduction 77    OT Treatment  Therapeutic Exercise:  Pt. tolerated PROM/AAROM in all joint ranges of the LUE including: shoulder flexion, abduction, horizontal abduction, elbow flexion, extension, forearm supination, wrist extension, thumb abduction, digit extension. Pt. Performed AROM for elbow flexion, extension, forearm supination.Pt. worked on hand to face patterns with her right hand in preparation for light grooming.   Manual Therapy:  Pt. performed scapular mobilizations for scapular elevation, depression, abduction/adduction in sidelying, andsitting.Scapular  mobes were performed to help normalize tone, and prepare UE for ROM and functional use.                      OT Education - 10/04/17 1205    Education provided Yes   Education Details LUE ROM   Person(s) Educated Patient   Methods Explanation;Demonstration;Verbal cues   Comprehension Verbalized understanding;Returned demonstration;Verbal cues required;Need further instruction             OT Long Term Goals - 08/24/17 1209      OT LONG TERM GOAL #1   Title Pt. will tolerate increased shoulder PROM by 20 degrees with pain improved to under 3/10   Baseline Flexion: 91, Abduction: 67   Time 12   Period Weeks   Status On-going   Target Date 10/13/17     OT LONG TERM GOAL #2   Title Pt. will formulate a full fist to be able to stabilize objects with her Left hand while using her right hand.    Baseline Digit flexion to [facility]: 2nd digit: 4cm, 3rd: 5.5, 4th: 6, 5th: 4.   Time 12   Period Weeks   Status On-going   Target Date 10/13/17     OT LONG TERM GOAL #3   Title Pt. will be able to wipe face independently with her Left UE.   Baseline Pt. is unable   Time 12   Period Weeks   Status On-going   Target Date 10/13/17     OT LONG TERM GOAL #4   Title Pt. will demonstrate independence with splint application.   Baseline Pt. is unable to donn splint    Time 12   Period Weeks   Status New   Target Date 10/13/17     OT LONG TERM GOAL #5   Title Pt. will initiate active grasp release with thumb to 2nd digit 100% of the time to be able to hold a utensil in preparation for self-feeding.   Baseline Minimal thumb movement.   Time 12   Period Weeks   Status On-going   Target Date 10/13/17     OT LONG TERM GOAL #6   Title Pt. will initiate wrist extension in preparation for functional reaching.   Baseline No active wrist extension.   Time 12   Period Weeks   Status On-going   Target Date 10/13/17               Plan - 10/04/17 1205    Clinical  Impression Statement Pt. was running late for the session today. Pt. plans to have botox injections tomorrow, and also has a follow-up appointment to the loop recorder procedure. Pt. continues to present with increased tone in  the LUE, and is limited by pain. Pt. conitnues to work on improving tone, tightness, and pain in order to improve ROM and functional use during ADLs, and IADLs.   Occupational performance deficits (Please refer to evaluation for details): ADL's;IADL's   Rehab Potential Good   OT Frequency 2x / week   OT Duration 12 weeks   OT Treatment/Interventions Self-care/ADL training;DME and/or AE instruction;Energy conservation;Therapeutic exercise;Neuromuscular education;Moist Heat;Therapeutic activities;Cognitive remediation/compensation;Therapeutic exercises;Patient/family education   Consulted and Agree with Plan of Care Patient      Patient will benefit from skilled therapeutic intervention in order to improve the following deficits and impairments:  Decreased cognition, Difficulty walking, Decreased balance, Decreased strength, Decreased safety awareness, Decreased coordination, Impaired UE functional use, Impaired tone, Decreased range of motion, Decreased endurance, Decreased activity tolerance, Impaired perceived functional ability, Pain  Visit Diagnosis: Muscle weakness (generalized)    Problem List Patient Active Problem List   Diagnosis Date Noted  . Adhesive capsulitis of left shoulder 08/06/2017  . Urinary frequency 07/11/2017  . Gait disturbance, post-stroke 05/31/2017  . Left spastic hemiparesis ([place]) 05/31/2017  . Adjustment disorder with mixed anxiety and depressed mood   . Hypokalemia   . HCAP (healthcare-associated pneumonia)   . Cough with fever   . 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 FUO (fever of unknown origin)   . Oral thrush   . Benign essential HTN   . Rheumatoid arthritis involving both hands ([place]) 04/28/2017  . Right-sided lacunar  infarction 04/27/2017  . CVA (cerebral vascular accident) ([place]) 04/24/2017  . LGSIL Pap smear of vagina 12/15/2016  . Anal skin tag 02/02/2016  . Inflammatory arthritis 01/19/2016  . Right knee pain 01/19/2016  . Genital herpes 10/20/2015  . GERD (gastroesophageal reflux disease) 10/20/2015  . Status post vaginal hysterectomy 10/20/2015  . Health care maintenance 05/30/2015  . LLQ pain 03/31/2014  . Menopausal symptoms 03/31/2014  . Hypercholesterolemia 12/31/2012  . Hypothyroidism 12/29/2012  . Osteoarthritis 12/29/2012  . PULMONARY NODULE 01/02/2008  . Mild depression ([place]) 12/30/2007  . Essential hypertension 12/30/2007    Harrel Carina, MS, OTR/L 10/04/2017, 12:16 PM  [place] MAIN [facility] SERVICES [address] [place], [place], [address] Phone: [phone]   Fax:  [phone]  Name: Donna Rhodes MRN: [id number] Date of Birth: [birth year]

## 2017-10-05 ENCOUNTER — Ambulatory Visit (INDEPENDENT_AMBULATORY_CARE_PROVIDER_SITE_OTHER): Payer: Self-pay | Admitting: *Deleted

## 2017-10-05 ENCOUNTER — Encounter: Payer: PPO | Attending: Physical Medicine & Rehabilitation

## 2017-10-05 ENCOUNTER — Encounter: Payer: Self-pay | Admitting: Physical Medicine & Rehabilitation

## 2017-10-05 ENCOUNTER — Ambulatory Visit (HOSPITAL_BASED_OUTPATIENT_CLINIC_OR_DEPARTMENT_OTHER): Payer: PPO | Admitting: Physical Medicine & Rehabilitation

## 2017-10-05 VITALS — BP 133/85 | HR 73 | Resp 14

## 2017-10-05 DIAGNOSIS — I10 Essential (primary) hypertension: Secondary | ICD-10-CM | POA: Insufficient documentation

## 2017-10-05 DIAGNOSIS — E785 Hyperlipidemia, unspecified: Secondary | ICD-10-CM | POA: Insufficient documentation

## 2017-10-05 DIAGNOSIS — G8114 Spastic hemiplegia affecting left nondominant side: Secondary | ICD-10-CM

## 2017-10-05 DIAGNOSIS — K219 Gastro-esophageal reflux disease without esophagitis: Secondary | ICD-10-CM | POA: Diagnosis not present

## 2017-10-05 DIAGNOSIS — I69354 Hemiplegia and hemiparesis following cerebral infarction affecting left non-dominant side: Secondary | ICD-10-CM | POA: Diagnosis not present

## 2017-10-05 DIAGNOSIS — R531 Weakness: Secondary | ICD-10-CM | POA: Insufficient documentation

## 2017-10-05 DIAGNOSIS — Z7982 Long term (current) use of aspirin: Secondary | ICD-10-CM | POA: Insufficient documentation

## 2017-10-05 DIAGNOSIS — M7989 Other specified soft tissue disorders: Secondary | ICD-10-CM | POA: Diagnosis not present

## 2017-10-05 DIAGNOSIS — R269 Unspecified abnormalities of gait and mobility: Secondary | ICD-10-CM | POA: Diagnosis not present

## 2017-10-05 DIAGNOSIS — M069 Rheumatoid arthritis, unspecified: Secondary | ICD-10-CM | POA: Diagnosis not present

## 2017-10-05 DIAGNOSIS — Z7902 Long term (current) use of antithrombotics/antiplatelets: Secondary | ICD-10-CM | POA: Insufficient documentation

## 2017-10-05 DIAGNOSIS — I639 Cerebral infarction, unspecified: Secondary | ICD-10-CM

## 2017-10-05 LAB — CUP PACEART INCLINIC DEVICE CHECK
Date Time Interrogation Session: 20181009110721
MDC IDC PG IMPLANT DT: 20180928

## 2017-10-05 NOTE — Patient Instructions (Signed)

## 2017-10-05 NOTE — Progress Notes (Signed)
Botox Injection for spasticity using needle EMG guidance  Dilution: 50 Units/ml Indication: Severe spasticity which interferes with ADL,mobility and/or  hygiene and is unresponsive to medication management and other conservative care Informed consent was obtained after describing risks and benefits of the procedure with the patient. This includes bleeding, bruising, infection, excessive weakness, or medication side effects. A REMS form is on file and signed. Needle: 27g 1" needle electrode Number of units per muscle  Biceps50 FCR25 FCU0 FDS25 FDP25 FPL25 Brachioradialis 50 All injections were done after obtaining appropriate EMG activity and after negative drawback for blood. The patient tolerated the procedure well. Post procedure instructions were given. A followup appointment was made.

## 2017-10-05 NOTE — Progress Notes (Signed)
Wound check in clinic s/p ILR implant. Steri strips removed. Wound well healed without redness or edema. Incision edges approximated. Normal ILR device function. Battery status: GOOD. R-waves 0.28mV. 0 symptom episodes, 0 tachy episodes, 0 pause episodes, 0 brady episodes. 0 AF episodes (0% burden). Patient education completed including wound care and remote montoring/billing. Monthly summary reports and ROV with GT PRN.

## 2017-10-06 ENCOUNTER — Encounter: Payer: Self-pay | Admitting: Physical Therapy

## 2017-10-06 ENCOUNTER — Ambulatory Visit: Payer: PPO | Admitting: Occupational Therapy

## 2017-10-06 ENCOUNTER — Ambulatory Visit: Payer: PPO | Admitting: Physical Therapy

## 2017-10-06 ENCOUNTER — Encounter: Payer: Self-pay | Admitting: Occupational Therapy

## 2017-10-06 DIAGNOSIS — M6281 Muscle weakness (generalized): Secondary | ICD-10-CM | POA: Diagnosis not present

## 2017-10-06 DIAGNOSIS — R278 Other lack of coordination: Secondary | ICD-10-CM

## 2017-10-06 DIAGNOSIS — R2689 Other abnormalities of gait and mobility: Secondary | ICD-10-CM

## 2017-10-06 NOTE — Therapy (Signed)
[place] MAIN [facility] SERVICES [address] [place], [place], [address] Phone: [phone]   Fax:  [phone]  Physical Therapy Treatment  Patient Details  Name: Donna Rhodes MRN: [id number] Date of Birth: Aug 08, 1946 Referring Provider: Alysia Penna  Encounter Date: 10/06/2017      PT End of Session - 10/06/17 1023    Visit Number 16   Number of Visits 41   Date for PT Re-Evaluation 12/08/17   Authorization Type gcode 6   Authorization Time Period 10   PT Start Time 1017   PT Stop Time 1100   PT Time Calculation (min) 43 min   Equipment Utilized During Treatment Gait belt   Activity Tolerance Patient tolerated treatment well   Behavior During Therapy [facility] for tasks assessed/performed      Past Medical History:  Diagnosis Date  . Atrophic vaginitis   . Dysplasia of vagina    vin 1 - vulva and vaginal cuff  . GERD (gastroesophageal reflux disease)   . HCV infection   . HSV (herpes simplex virus) infection   . Hypercholesterolemia   . Hypertension   . Hypothyroidism   . Lung nodule    stable  . Menopausal state   . Osteoarthritis    hand involvement, cervical disc disease  . Osteoporosis   . Pelvic pain in female   . Rheumatoid arthritis ([place])   . 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 Thyroid nodule   . Vaginal Pap smear, abnormal    lgsil    Past Surgical History:  Procedure Laterality Date  . ADENOIDECTOMY    . APPENDECTOMY  1965  . [place]   left  . CHOLECYSTECTOMY    . [place]  . laser vein surgery    . LOOP RECORDER INSERTION N/A 09/24/2017   Procedure: LOOP RECORDER INSERTION;  Surgeon: Evans Lance, MD;  Location: [place] [facility];  Service: Cardiovascular;  Laterality: N/A;  . MANDIBLE RECONSTRUCTION  1992  . TOENAIL EXCISION  1998   several  . [place]  . VAGINAL HYSTERECTOMY  1983   als0- anterior/posterior colporrhaphy  . VAGINAL WOUND CLOSURE /  REPAIR  1999, 2000  . VAGINAL WOUND CLOSURE / REPAIR      There were no vitals filed for this visit.      Subjective Assessment - 10/06/17 1021    Subjective Patient reports still having intermittent LLE posterior thigh pain. She reports doing the stretches but states that her pain is still there sometimes. Denies any pain currently; reports in the last week has had up to 7/10 LLE pain;    Patient is accompained by: Family member   Pertinent History Pt is a 71 y.o female that presents to therapy s/p CVA; On April 24, 2017 pt suffered an acute infarct in right basal ganglia. She was in hospital 3 days; after pt was transferred to [facility] where she did inpatient therapy; Pt then completed home PT until about 2 weeks ago; Pt has pain in shoulder when moving it with R UE and reports that deltoid is paralyzed; Pt fell out of bed one day and fell on L UE; Pt reported doctor put a shot in L shoulder and will receive 3 more shots in shoulder; Pt reports no sensation or numbness and tingling with LE just some sensation impaired in L hand; Pt has an air cast for L LE and splint for wrist but does not use  because difficulty donning;  Pt has had trouble sleeping at night with waking and going to the bathroom but denies any pain;  Only has pain in shoulder when moving described as throbbing; Pt reports not using her cane in the last month and states her balance is fine without it;    Limitations Lifting;Standing;Walking   How long can you sit comfortably? hours    How long can you stand comfortably? 30 min    How long can you walk comfortably? better in the morning because more energy;    Diagnostic tests x rays in shoulder and hand; all clear; doctor stated "inferior pseudo-subluxation from paralysis of the deltoid. she has advanced left hand arthritis"   Patient Stated Goals walking better without the cane;    Currently in Pain? No/denies   Pain Onset More than a month ago          TREATMENT: Warm  up on Nustep RUE and BLE level 2 x4 min (unbilled); Patient supine: PT performed passive SLR hamstring stretch with ankle DF to improve flexibility 20 sec hold x2 reps;  PT performed passive hamstring neural stretch  5 sec hold x5, LLE only with cues to relax during stretch for better flexibility;   LLE SAQ with small ball under knee 3 sec hold x15 reps with cues for positioning and to slow down LE movement for better strengthening;  LLE single leg bridge 2x15 with cues for positioning and to keep arms across chest for better hip strengthening;  BLE ball squeeze hip adductor strengthening x15 reps with cues to increase push through LLE for better hip strengthening;  PT instructed patient in PNF LLE hip D1/D2 flexion/extension with moderate manual resistance 2x10 each with cues for positioning and to increase ROM for better strengthening;  Right sidelying; LLE hip abductor SLR 2x10 reps with cues to keep knee straight and cues to improve positioning for strengthening; Patient often slides leg forward and exhibits poor hip control; Required cues to slow down motion, keep knee straight and improve hip position;  Instructed patient in LLE hip clamshells with green tband 2x12 with min A and cues to avoid posterior rotation and to increase hip abductor for strengthening;    Resisted walking 12.5# forward/backward, side/side (2 way) x2 laps each with min  A for safety and cues to increase step length and improve weight shift for better balance control;    Leg press: LLE only plate 45# 4B58, with cues to slow down LE movement and to avoid hip/knee ER for better quad control;   Patient denies any increase in pain with advanced exercise;                       PT Education - 10/06/17 1022    Education provided Yes   Education Details LLE stretches, strengthening; HEP reinforced;    Person(s) Educated Patient   Methods Explanation;Demonstration;Verbal cues   Comprehension  Verbalized understanding;Returned demonstration;Verbal cues required;Need further instruction             PT Long Term Goals - 09/15/17 1143      PT LONG TERM GOAL #1   Title Pt will become independent with HEP in order to improve ADL independence;   Baseline Pt performs several exercises frequently, but inconsistently performs other exercises.    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 on 8/28   Time 12   Period Weeks   Status Revised   Target Date 12/08/17     PT LONG TERM GOAL #4   Title Pt will improve 6MWT to >1300 feet in order to be considered 80% of age group norm and to improve activity tolerance and community ambulation distance for improved IADLs (revised from 1000")   Baseline 565 feet, improved to 875' on 8/28, improved to 1105' on 9/19   Time 12   Period Weeks   Status Revised   Target Date 12/08/17     PT LONG TERM GOAL #5   Title Pt will increase strength in L LE to 4+/5 for all muscles in order to improve mobility and function with daily activities;    Baseline pt is 4+/5 in all LE except for L hip abduction: 4/5   Time 12   Period Weeks   Status Partially Met   Target Date 12/08/17     PT LONG TERM GOAL #6   Title Pt will ambulate with equal stride length, no toe drag on LLE, and equal stance and swing time bilaterally to demonstrate improved gait mechanics for more efficient and functional  mobility.   Time 12   Period Weeks   Status New   Target Date 12/08/17               Plan - 10/06/17 1104    Clinical Impression Statement Patient instructed in advanced LE stretches and strengthening exercise. She requires cues for correct positioning for better muscle activation. Patient has significant weakness in LLE hip especially hip abductors and extensors. She experienced less discomfort with exercise today. She would benefit from additional skilled PT intervention to improve strength, balance and gait safety;    Rehab Potential Fair   Clinical Impairments Affecting Rehab Potential neg: age, lives alone, comorbidities, pos: wants to be more independent; already high functioning    PT Frequency 2x / week   PT Duration 12 weeks   PT Treatment/Interventions ADLs/Self Care Home Management;Aquatic Therapy;Moist Heat;Cryotherapy;DME Instruction;Gait training;Stair training;Functional mobility training;Therapeutic activities;Therapeutic exercise;Balance training;Neuromuscular re-education;Manual techniques;Passive range of motion;Energy conservation;Electrical Stimulation;Patient/family education;Orthotic Fit/Training   PT Next Visit Plan berg; stairs; strengthening, Assess adductor tightness   PT Home Exercise Plan continue as given;    Consulted and Agree with Plan of Care Patient      Patient will benefit from skilled therapeutic intervention in order to improve the following deficits and impairments:  Abnormal gait, Impaired sensation, Decreased mobility, Decreased activity tolerance, Decreased endurance, Decreased range of motion, Decreased strength, Impaired UE functional use, Decreased balance, Decreased safety awareness, Difficulty walking, Pain  Visit Diagnosis: Muscle weakness (generalized)  Other lack of coordination  Other abnormalities of gait and mobility     Problem List Patient Active Problem List   Diagnosis Date Noted  . Adhesive capsulitis of left  shoulder 08/06/2017  . Urinary frequency 07/11/2017  . Gait disturbance, post-stroke 05/31/2017  . Left spastic hemiparesis ([place]) 05/31/2017  . Adjustment disorder with mixed anxiety and depressed mood   . Hypokalemia   . HCAP (healthcare-associated pneumonia)   . Cough with fever   . 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 FUO (fever of unknown origin)   . Oral thrush   . Benign essential HTN   . Rheumatoid arthritis involving both hands ([place]) 04/28/2017  . Right-sided lacunar infarction 04/27/2017  . CVA (cerebral vascular accident) ([place]) 04/24/2017  . LGSIL Pap smear of vagina 12/15/2016  . Anal skin tag 02/02/2016  . Inflammatory arthritis 01/19/2016  . Right knee pain 01/19/2016  . Genital herpes 10/20/2015  . GERD (gastroesophageal reflux disease) 10/20/2015  . Status post vaginal hysterectomy 10/20/2015  . Health care maintenance 05/30/2015  . LLQ pain 03/31/2014  . Menopausal symptoms 03/31/2014  . Hypercholesterolemia 12/31/2012  . Hypothyroidism 12/29/2012  . Osteoarthritis 12/29/2012  . PULMONARY NODULE 01/02/2008  . Mild depression ([place]) 12/30/2007  . Essential hypertension 12/30/2007    Trotter,Margaret PT, DPT 10/06/2017, 11:05 AM  [place] MAIN [facility] SERVICES [address] [place], [place], [address] Phone: [phone]   Fax:  [phone]  Name: Donna Rhodes MRN: [id number] Date of Birth: Feb 09, 1946

## 2017-10-06 NOTE — Therapy (Signed)
[place] MAIN [facility] SERVICES [address] [place], [place], [address] Phone: [phone]   Fax:  [phone]  Occupational Therapy Treatment  Patient Details  Name: Donna Rhodes MRN: [id number] Date of Birth: 27-Jul-1946 Referring Provider: Dr. Letta Pate  Encounter Date: 10/06/2017      OT End of Session - 10/06/17 1327    Visit Number 19   Number of Visits 24   Date for OT Re-Evaluation 10/13/17   Authorization Type Medicare G Code 9 of 10   OT Start Time 1100   OT Stop Time 1145   OT Time Calculation (min) 45 min   Activity Tolerance Patient tolerated treatment well   Behavior During Therapy [facility] for tasks assessed/performed      Past Medical History:  Diagnosis Date  . Atrophic vaginitis   . Dysplasia of vagina    vin 1 - vulva and vaginal cuff  . GERD (gastroesophageal reflux disease)   . HCV infection   . HSV (herpes simplex virus) infection   . Hypercholesterolemia   . Hypertension   . Hypothyroidism   . Lung nodule    stable  . Menopausal state   . Osteoarthritis    hand involvement, cervical disc disease  . Osteoporosis   . Pelvic pain in female   . Rheumatoid arthritis ([place])   . 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 Thyroid nodule   . Vaginal Pap smear, abnormal    lgsil    Past Surgical History:  Procedure Laterality Date  . ADENOIDECTOMY    . APPENDECTOMY  1965  . [place]   left  . CHOLECYSTECTOMY    . [place]  . laser vein surgery    . LOOP RECORDER INSERTION N/A 09/24/2017   Procedure: LOOP RECORDER INSERTION;  Surgeon: Evans Lance, MD;  Location: [place] CV LAB;  Service: Cardiovascular;  Laterality: N/A;  . MANDIBLE RECONSTRUCTION  1992  . TOENAIL EXCISION  1998   several  . [place]  . VAGINAL HYSTERECTOMY  1983   als0- anterior/posterior colporrhaphy  . VAGINAL WOUND CLOSURE / REPAIR  1999, 2000  . VAGINAL WOUND CLOSURE / REPAIR      There  were no vitals filed for this visit.      Subjective Assessment - 10/06/17 1321    Subjective  Pt. reports seeing Dr. Letta Pate lat week.   Patient is accompained by: Family member   Pertinent History Pt. is a 71 y.o. female who had a CVA on 04/24/2017. Pt. went to the ER when she started not feeling well, and having heart palpitations. Pt. reports having work up initially, however was sent home. Pt. reports later in the day she was unable to write with her left hand. Pt. returned to the ER, and was admitted with workup for a CVA. Pt. was admitted to [facility], and stayed for approximately 3 weeks. Pt. returned home alone, and  received home health services for the past 2 months. Pt. now presents for Outpatient OT services. Pt. resides home alone, and has support children who stop by daily, assist with shopping, transportation, and  ADL/IADL tasks as needed.    Patient Stated Goals To get back the way she was. (Independent)   Currently in Pain? Yes   Pain Score 4       OT Treatment  Therapeutic Exercise:  Pt. tolerated PROM/AAROMin all joint ranges of the LUE including: shoulder flexion, abduction, horizontal  abduction, elbow flexion, extension, forearm supination, wrist extension, thumb abduction, digit extension. Pt. Performed AROM for elbow flexion, extension, forearm supination.Pt. worked on hand to face patterns with her right hand in preparation for light grooming.   Manual Therapy:  Pt. performed scapular mobilizations for scapular elevation, depression, abduction/adduction in sidelying, andsitting.Scapular mobes were performed to help normalize tone, and prepare UE for ROM and functional use.                          OT Education - 10/06/17 1326    Education provided Yes   Education Details LUE functioning.   Person(s) Educated Patient   Methods Explanation;Demonstration;Verbal cues   Comprehension Verbalized understanding;Returned  demonstration;Verbal cues required;Need further instruction             OT Long Term Goals - 08/24/17 1209      OT LONG TERM GOAL #1   Title Pt. will tolerate increased shoulder PROM by 20 degrees with pain improved to under 3/10   Baseline Flexion: 91, Abduction: 67   Time 12   Period Weeks   Status On-going   Target Date 10/13/17     OT LONG TERM GOAL #2   Title Pt. will formulate a full fist to be able to stabilize objects with her Left hand while using her right hand.    Baseline Digit flexion to [facility]: 2nd digit: 4cm, 3rd: 5.5, 4th: 6, 5th: 4.   Time 12   Period Weeks   Status On-going   Target Date 10/13/17     OT LONG TERM GOAL #3   Title Pt. will be able to wipe face independently with her Left UE.   Baseline Pt. is unable   Time 12   Period Weeks   Status On-going   Target Date 10/13/17     OT LONG TERM GOAL #4   Title Pt. will demonstrate independence with splint application.   Baseline Pt. is unable to donn splint    Time 12   Period Weeks   Status New   Target Date 10/13/17     OT LONG TERM GOAL #5   Title Pt. will initiate active grasp release with thumb to 2nd digit 100% of the time to be able to hold a utensil in preparation for self-feeding.   Baseline Minimal thumb movement.   Time 12   Period Weeks   Status On-going   Target Date 10/13/17     OT LONG TERM GOAL #6   Title Pt. will initiate wrist extension in preparation for functional reaching.   Baseline No active wrist extension.   Time 12   Period Weeks   Status On-going   Target Date 10/13/17               Plan - 10/06/17 1328    Clinical Impression Statement Pt. had Botox injections yesterday for the LUE. Pt. has soreness at the injection sites. Pt. continues to present with limited LUE AROM, and functioning which hinders her ability to complete ADL, and IADL tasks. Pt. continues to work on improving UE strength, and coordination skills.   Occupational performance deficits  (Please refer to evaluation for details): ADL's;IADL's   Rehab Potential Good   OT Frequency 2x / week   OT Duration 12 weeks   OT Treatment/Interventions Self-care/ADL training;DME and/or AE instruction;Energy conservation;Therapeutic exercise;Neuromuscular education;Moist Heat;Therapeutic activities;Cognitive remediation/compensation;Therapeutic exercises;Patient/family education   Consulted and Agree with Plan of Care Patient  Patient will benefit from skilled therapeutic intervention in order to improve the following deficits and impairments:  Decreased cognition, Difficulty walking, Decreased balance, Decreased strength, Decreased safety awareness, Decreased coordination, Impaired UE functional use, Impaired tone, Decreased range of motion, Decreased endurance, Decreased activity tolerance, Impaired perceived functional ability, Pain  Visit Diagnosis: Muscle weakness (generalized)    Problem List Patient Active Problem List   Diagnosis Date Noted  . Adhesive capsulitis of left shoulder 08/06/2017  . Urinary frequency 07/11/2017  . Gait disturbance, post-stroke 05/31/2017  . Left spastic hemiparesis ([place]) 05/31/2017  . Adjustment disorder with mixed anxiety and depressed mood   . Hypokalemia   . HCAP (healthcare-associated pneumonia)   . Cough with fever   . 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 FUO (fever of unknown origin)   . Oral thrush   . Benign essential HTN   . Rheumatoid arthritis involving both hands ([place]) 04/28/2017  . Right-sided lacunar infarction 04/27/2017  . CVA (cerebral vascular accident) ([place]) 04/24/2017  . LGSIL Pap smear of vagina 12/15/2016  . Anal skin tag 02/02/2016  . Inflammatory arthritis 01/19/2016  . Right knee pain 01/19/2016  . Genital herpes 10/20/2015  . GERD (gastroesophageal reflux disease) 10/20/2015  . Status post vaginal hysterectomy 10/20/2015  . Health care maintenance 05/30/2015  . LLQ pain 03/31/2014  . Menopausal  symptoms 03/31/2014  . Hypercholesterolemia 12/31/2012  . Hypothyroidism 12/29/2012  . Osteoarthritis 12/29/2012  . PULMONARY NODULE 01/02/2008  . Mild depression ([place]) 12/30/2007  . Essential hypertension 12/30/2007    Harrel Carina, MS, OTR/L 10/06/2017, 1:40 PM  [place] MAIN [facility] SERVICES [address] [place], [place], [address] Phone: [phone]   Fax:  [phone]  Name: Donna Rhodes MRN: [id number] Date of Birth: [birth year]

## 2017-10-07 DIAGNOSIS — M0609 Rheumatoid arthritis without rheumatoid factor, multiple sites: Secondary | ICD-10-CM | POA: Diagnosis not present

## 2017-10-07 DIAGNOSIS — Z79899 Other long term (current) drug therapy: Secondary | ICD-10-CM | POA: Diagnosis not present

## 2017-10-11 ENCOUNTER — Ambulatory Visit: Payer: PPO | Admitting: Occupational Therapy

## 2017-10-11 ENCOUNTER — Encounter: Payer: Self-pay | Admitting: Physical Therapy

## 2017-10-11 ENCOUNTER — Ambulatory Visit: Payer: PPO | Admitting: Physical Therapy

## 2017-10-11 DIAGNOSIS — M6281 Muscle weakness (generalized): Secondary | ICD-10-CM

## 2017-10-11 DIAGNOSIS — R278 Other lack of coordination: Secondary | ICD-10-CM

## 2017-10-11 DIAGNOSIS — R2689 Other abnormalities of gait and mobility: Secondary | ICD-10-CM

## 2017-10-11 NOTE — Therapy (Addendum)
[place] MAIN [facility] SERVICES [address] [place], [place], [address] Phone: [phone]   Fax:  [phone]  Occupational Therapy Treatment/Recertification/G-Code Note  Patient Details  Name: Donna Rhodes MRN: [id number] Date of Birth: [birth year] Referring Provider: Dr. Letta Pate  Encounter Date: 10/11/2017      OT End of Session - 10/11/17 1216    Visit Number 20   Number of Visits 48   Date for OT Re-Evaluation 10/13/17   Authorization Type Medicare G Code 10 of 10   OT Start Time 1050   OT Stop Time 1115   OT Time Calculation (min) 25 min   Activity Tolerance Patient tolerated treatment well   Behavior During Therapy [facility] for tasks assessed/performed      Past Medical History:  Diagnosis Date  . Atrophic vaginitis   . Dysplasia of vagina    vin 1 - vulva and vaginal cuff  . GERD (gastroesophageal reflux disease)   . HCV infection   . HSV (herpes simplex virus) infection   . Hypercholesterolemia   . Hypertension   . Hypothyroidism   . Lung nodule    stable  . Menopausal state   . Osteoarthritis    hand involvement, cervical disc disease  . Osteoporosis   . Pelvic pain in female   . Rheumatoid arthritis ([place])   . Stroke (cerebrum) ([place]) 04/24/2017  . Thyroid nodule   . Vaginal Pap smear, abnormal    lgsil    Past Surgical History:  Procedure Laterality Date  . ADENOIDECTOMY    . APPENDECTOMY  1965  . [place]   left  . CHOLECYSTECTOMY    . [place]  . laser vein surgery    . LOOP RECORDER INSERTION N/A 09/24/2017   Procedure: LOOP RECORDER INSERTION;  Surgeon: Evans Lance, MD;  Location: [place] CV LAB;  Service: Cardiovascular;  Laterality: N/A;  . MANDIBLE RECONSTRUCTION  1992  . TOENAIL EXCISION  1998   several  . [place]  . VAGINAL HYSTERECTOMY  1983   als0- anterior/posterior colporrhaphy  . VAGINAL WOUND CLOSURE / REPAIR  1999, 2000  . VAGINAL WOUND  CLOSURE / REPAIR      There were no vitals filed for this visit.      Subjective Assessment - 10/11/17 1214    Subjective  Pt. reports having had a bad night with UE pain when trying to sleep.   Patient is accompained by: Family member   Pertinent History Pt. is a 71 y.o. female who had a CVA on 04/24/2017. Pt. went to the ER when she started not feeling well, and having heart palpitations. Pt. reports having work up initially, however was sent home. Pt. reports later in the day she was unable to write with her left hand. Pt. returned to the ER, and was admitted with workup for a CVA. Pt. was admitted to [facility], and stayed for approximately 3 weeks. Pt. returned home alone, and  received home health services for the past 2 months. Pt. now presents for Outpatient OT services. Pt. resides home alone, and has support children who stop by daily, assist with shopping, transportation, and  ADL/IADL tasks as needed.    Limitations Pt. is unable to use her dominant RUE during functional tasks.   Patient Stated Goals To get back the way she was. (Independent)   Currently in Pain? No/denies   Pain Score 7    Pain  Location Arm   Pain Orientation Left      OT TREATMENT    Therapeutic Exercise:  Pt. tolerated PROM for shoulder flexion. Limited tolerance for abduction, and external rotation today. Pt. requires consistent verbal cues, and visual demonstration about ROM, self-ROM, and positioning. And cuing for proper technique. Pt. often requires repeating of information, and cues for redirection.                           OT Education - 10/11/17 1215    Education provided Yes   Education Details UE ROM, positioning   Person(s) Educated Patient   Methods Explanation;Demonstration;Verbal cues   Comprehension Verbalized understanding;Returned demonstration;Verbal cues required;Need further instruction             OT Long Term Goals - 10/11/17 1219       OT LONG TERM GOAL #1   Title Pt. will tolerate increased shoulder PROM by 20 degrees with pain improved to under 3/10   Baseline Flexion: 91, Abduction: 67 10/11/2017: flexion: 108, Abduction: 77   Time 12   Period Weeks   Status On-going   Target Date 01/03/18     OT LONG TERM GOAL #2   Title Pt. will formulate a full fist to be able to stabilize objects with her Left hand while using her right hand.    Baseline Digit flexion to [facility]: 2nd digit: 4cm, 3rd: 5.5, 4th: 6, 5th: 4.   Time 12   Period Weeks   Status On-going   Target Date 01/03/18     OT LONG TERM GOAL #3   Title Pt. will be able to wipe face independently with her Left UE.   Baseline Pt. is unable 10/11/2017: hand-over hand assist required   Period Weeks   Status On-going   Target Date 01/03/18     OT LONG TERM GOAL #4   Title Pt. will demonstrate independence with splint application.   Baseline Pt. is unable to donn splint 10/11/2017: assist required   Time 12   Period Weeks   Status On-going   Target Date 01/03/18     OT LONG TERM GOAL #5   Title Pt. will initiate active grasp release with thumb to 2nd digit 100% of the time to be able to hold a utensil in preparation for self-feeding.   Baseline Minimal thumb movement. 10/11/2018: Pt. conitnues to have limited thumb motion.   Time 12   Period Weeks   Status On-going   Target Date 01/03/18     OT LONG TERM GOAL #6   Title Pt. will initiate wrist extension in preparation for functional reaching.   Baseline No active wrist extension. 10/11/2018: inconsistently initiating wrist extension.   Time 12   Period Weeks   Status On-going   Target Date 01/03/18               Plan - 10/11/17 1222    Clinical Impression Statement Pt. reports not having had any sleep last night, secondary to having pain in the Left arm. Pt. reports she almost did not come to therapy. Pt. had botox injections last week. Pt. continues to have pain throughout the LUE. Pt.  reports working out in her yard over the weekend. Pt. had limited tolerance for shoulder ROM today. Education was thoroughly provided about ROM,  self-ROM, and positioning of the LUE. Goals were reviewed. Pt.continues to benefit from the same POC.   Occupational performance deficits (Please refer to  evaluation for details): ADL's;IADL's   Rehab Potential Good   OT Frequency 2x / week   OT Duration 12 weeks   OT Treatment/Interventions Self-care/ADL training;DME and/or AE instruction;Energy conservation;Therapeutic exercise;Neuromuscular education;Moist Heat;Therapeutic activities;Cognitive remediation/compensation;Therapeutic exercises;Patient/family education   Consulted and Agree with Plan of Care Patient      Patient will benefit from skilled therapeutic intervention in order to improve the following deficits and impairments:  Decreased cognition, Difficulty walking, Decreased balance, Decreased strength, Decreased safety awareness, Decreased coordination, Impaired UE functional use, Impaired tone, Decreased range of motion, Decreased endurance, Decreased activity tolerance, Impaired perceived functional ability, Pain  Visit Diagnosis: Muscle weakness (generalized)    Problem List Patient Active Problem List   Diagnosis Date Noted  . Adhesive capsulitis of left shoulder 08/06/2017  . Urinary frequency 07/11/2017  . Gait disturbance, post-stroke 05/31/2017  . Left spastic hemiparesis ([place]) 05/31/2017  . Adjustment disorder with mixed anxiety and depressed mood   . Hypokalemia   . HCAP (healthcare-associated pneumonia)   . Cough with fever   . 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 FUO (fever of unknown origin)   . Oral thrush   . Benign essential HTN   . Rheumatoid arthritis involving both hands ([place]) 04/28/2017  . Right-sided lacunar infarction 04/27/2017  . CVA (cerebral vascular accident) ([place]) 04/24/2017  . LGSIL Pap smear of vagina 12/15/2016  . Anal skin tag  02/02/2016  . Inflammatory arthritis 01/19/2016  . Right knee pain 01/19/2016  . Genital herpes 10/20/2015  . GERD (gastroesophageal reflux disease) 10/20/2015  . Status post vaginal hysterectomy 10/20/2015  . Health care maintenance 05/30/2015  . LLQ pain 03/31/2014  . Menopausal symptoms 03/31/2014  . Hypercholesterolemia 12/31/2012  . Hypothyroidism 12/29/2012  . Osteoarthritis 12/29/2012  . PULMONARY NODULE 01/02/2008  . Mild depression ([place]) 12/30/2007  . Essential hypertension 12/30/2007    Harrel Carina, MS, OTR/L 10/11/2017, 2:20 PM  [place] MAIN [facility] SERVICES [address] [place], [place], [address] Phone: [phone]   Fax:  [phone]  Name: RHILYNN PREYER MRN: [id number] Date of Birth: 08-06-1946

## 2017-10-11 NOTE — Therapy (Signed)
[place] MAIN [facility] SERVICES [address] [place], [place], [address] Phone: [phone]   Fax:  [phone]  Physical Therapy Treatment  Patient Details  Name: Donna Rhodes MRN: [id number] Date of Birth: 01-20-1946 Referring Provider: Alysia Penna  Encounter Date: 10/11/2017      Donna Rhodes End of Session - 10/11/17 1122    Visit Number 17   Number of Visits 41   Date for Donna Rhodes Re-Evaluation 12/08/17   Authorization Type gcode 7   Authorization Time Period 10   Donna Rhodes Start Time 1118   Donna Rhodes Stop Time 1200   Donna Rhodes Time Calculation (min) 42 min   Equipment Utilized During Treatment Gait belt   Activity Tolerance Patient tolerated treatment well;No increased pain   Behavior During Therapy WFL for tasks assessed/performed      Past Medical History:  Diagnosis Date  . Atrophic vaginitis   . Dysplasia of vagina    vin 1 - vulva and vaginal cuff  . GERD (gastroesophageal reflux disease)   . HCV infection   . HSV (herpes simplex virus) infection   . Hypercholesterolemia   . Hypertension   . Hypothyroidism   . Lung nodule    stable  . Menopausal state   . Osteoarthritis    hand involvement, cervical disc disease  . Osteoporosis   . Pelvic pain in female   . Rheumatoid arthritis ([place])   . Stroke (cerebrum) ([place]) 04/24/2017  . Thyroid nodule   . Vaginal Pap smear, abnormal    lgsil    Past Surgical History:  Procedure Laterality Date  . ADENOIDECTOMY    . APPENDECTOMY  1965  . [facility]   left  . CHOLECYSTECTOMY    . [place]  . laser vein surgery    . LOOP RECORDER INSERTION N/A 09/24/2017   Procedure: LOOP RECORDER INSERTION;  Surgeon: Evans Lance, MD;  Location: [place] [facility];  Service: Cardiovascular;  Laterality: N/A;  . MANDIBLE RECONSTRUCTION  1992  . TOENAIL EXCISION  1998   several  . [place]  . VAGINAL HYSTERECTOMY  1983   als0- anterior/posterior colporrhaphy  . VAGINAL  WOUND CLOSURE / REPAIR  1999, 2000  . VAGINAL WOUND CLOSURE / REPAIR      There were no vitals filed for this visit.      Subjective Assessment - 10/11/17 1121    Subjective Patient reports being a little sore over the weekend. She reports that she was pulling weeds on Saturday and she was so sore yesterday. She reports not sleeping well last night as she had increased LUE discomfort in the middle of the night. She denies any pain currently;    Patient is accompained by: Family member   Pertinent History Donna Rhodes is a 71 y.o female that presents to therapy s/p CVA; On April 24, 2017 Donna Rhodes suffered an acute infarct in right basal ganglia. 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; Donna Rhodes fell out of bed one day and fell on L UE; Donna Rhodes reported doctor put a shot in L shoulder and will receive 3 more shots in shoulder; Donna Rhodes reports no sensation or numbness and tingling with LE just some sensation impaired in L hand; Donna Rhodes has an air cast  for L LE and splint for wrist but does not use because difficulty donning;  Donna Rhodes has had trouble sleeping at night with waking and going to the bathroom but denies any pain;  Only has pain in shoulder when moving described as throbbing; Donna Rhodes reports not using her cane in the last month and states her balance is fine without it;    Limitations Lifting;Standing;Walking   How long can you sit comfortably? hours    How long can you stand comfortably? 30 min    How long can you walk comfortably? better in the morning because more energy;    Diagnostic tests x rays in shoulder and hand; all clear; doctor stated "inferior pseudo-subluxation from paralysis of the deltoid. she has advanced left hand arthritis"   Patient Stated Goals walking better without the cane;    Currently in Pain? No/denies   Pain  [place] More than a month ago         TREATMENT: Warm up on N[place] RUE and BLE level 2 x4 min (unbilled);   Standing with back against wall: LLE terminal knee extension against wall with small green ball 3 sec hold x15; RLE march with LLE terminal knee extension against ball x20 reps with 1 HHA progressing to 0 HHA with cues for weight shift and to increase LLE quad control;   Seated LLE ball squeeze x20 reps; LLE ankle DF/EV red tband 2x10 each with min VCs to isolate ankle ROM and increase ROM for better strengthening;  Forward step ups on 4 inch step with 1-0 rail assist leading with LLE x20 reps with cues to improve LLE ankle DF and knee position for better quad control and strengthening; Required CGA for safety;   Tandem gait on airex beam with 1 rail assist with visual cues to increase LLE knee hip adduction for better knee position and foot placement to avoid LLE ankle IV x6 laps;  Standing with green tband around ankles: Hip abduction 2x12 with cues for foot placement and knee control for better hip strengthening; Hip extension/hamstring curl 2x10 with cues to increase LLE knee control in stance and to improve knee flexion on LLE during active exercise; Patient reports increased difficulty with hip extension but denies any discomfort;     Leg press: LLE only plate 45# 6M60, with cues to slow down LE movement and to avoid hip/knee ER for better quad control;   Patient denies any increase in pain with advanced exercise                          Donna Rhodes Education - 10/11/17 1122    Education provided Yes   Education Details LE strengthening, gait safety, mobility;    Person(s) Educated Patient   Methods Explanation;Demonstration;Verbal cues   Comprehension Verbalized understanding;Returned demonstration;Verbal cues required;Need further instruction             Donna Rhodes Long Term Goals - 09/15/17 1143      Donna Rhodes LONG TERM GOAL #1   Title Donna Rhodes will become  independent with HEP in order to improve ADL independence;   Baseline Donna Rhodes performs several exercises frequently, but inconsistently performs other exercises.    Time 12   Period Weeks   Status On-going   Target Date 12/08/17     Donna Rhodes LONG TERM GOAL #2   Title Donna Rhodes will decrease 5 times sit to stand to less than 15 sec with no UE use to decrease risk of fall;  Baseline 18 sec, improve to 11.65 sec indicating Donna Rhodes is low fall risk for age > 37 on 8/28   Time 8   Period Weeks   Status Achieved   Target Date 09/15/17     Donna Rhodes LONG TERM GOAL #3   Title Donna Rhodes will improve gait speed in 10MWT to 1.81ms in order to show meaningful improvement and to be considered a comunity ambulator   Baseline .566m improved to 1.64m32mindicating 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 on 8/28   Time 12   Period Weeks   Status Revised   Target Date 12/08/17     Donna Rhodes LONG TERM GOAL #4   Title Donna Rhodes will improve 6MWT to >1300 feet in order to be considered 80% of age group norm and to improve activity tolerance and community ambulation distance for improved IADLs (revised from 1000")   Baseline 565 feet, improved to 875' on 8/28, improved to 1105' on 9/19   Time 12   Period Weeks   Status Revised   Target Date 12/08/17     Donna Rhodes LONG TERM GOAL #5   Title Donna Rhodes will increase strength in L LE to 4+/5 for all muscles in order to improve mobility and function with daily activities;    Baseline Donna Rhodes is 4+/5 in all LE except for L hip abduction: 4/5   Time 12   Period Weeks   Status Partially Met   Target Date 12/08/17     Donna Rhodes LONG TERM GOAL #6   Title Donna Rhodes will ambulate with equal stride length, no toe drag on LLE, and equal stance and swing time bilaterally to demonstrate improved gait mechanics for more efficient and functional mobility.   Time 12   Period Weeks   Status New   Target Date 12/08/17               Plan - 10/11/17 1145    Clinical Impression Statement Patient instructed in advanced LE exercise.  Progressed exercise to standing. Patient continues to require cues for LLE quad/knee control in stance for better LE placement/foot placement. Patient able to exhibit better knee/ankle control on airex beam during tandem walk. She would benefit from additional skilled Donna Rhodes intervention to improve strength, balance and gait safety;    Rehab Potential Fair   Clinical Impairments Affecting Rehab Potential neg: age, lives alone, comorbidities, pos: wants to be more independent; already high functioning    Donna Rhodes Frequency 2x / week   Donna Rhodes Duration 12 weeks   Donna Rhodes Treatment/Interventions ADLs/Self Care Home Management;Aquatic Therapy;Moist Heat;Cryotherapy;DME Instruction;Gait training;Stair training;Functional mobility training;Therapeutic activities;Therapeutic exercise;Balance training;Neuromuscular re-education;Manual techniques;Passive range of motion;Energy conservation;Electrical Stimulation;Patient/family education;Orthotic Fit/Training   Donna Rhodes Next Visit Plan berg; stairs; strengthening, Assess adductor tightness   Donna Rhodes Home Exercise Plan continue as given;    Consulted and Agree with Plan of Care Patient      Patient will benefit from skilled therapeutic intervention in order to improve the following deficits and impairments:  Abnormal gait, Impaired sensation, Decreased mobility, Decreased activity tolerance, Decreased endurance, Decreased range of motion, Decreased strength, Impaired UE functional use, Decreased balance, Decreased safety awareness, Difficulty walking, Pain  Visit Diagnosis: Muscle weakness (generalized)  Other lack of coordination  Other abnormalities of gait and mobility     Problem List Patient Active Problem List   Diagnosis Date Noted  . Adhesive capsulitis of left shoulder 08/06/2017  . Urinary frequency 07/11/2017  . Gait disturbance, post-stroke 05/31/2017  . Left sp[place]iparesis [place]6/03/2017  .  Adjustment disorder with mixed anxiety and depressed mood   .  Hypokalemia   . HCAP (healthcare-associated pneumonia)   . Cough with fever   . 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 FUO (fever of unknown origin)   . Oral thrush   . Benign essential HTN   . Rheumatoid arthritis involving both hands ([place]) 04/28/2017  . Right-sided lacunar infarction 04/27/2017  . CVA (cerebral vascular accident) ([place]) 04/24/2017  . LGSIL Pap smear of vagina 12/15/2016  . Anal skin tag 02/02/2016  . Inflammatory arthritis 01/19/2016  . Right knee pain 01/19/2016  . Genital herpes 10/20/2015  . GERD (gastroesophageal reflux disease) 10/20/2015  . Status post vaginal hysterectomy 10/20/2015  . Health care maintenance 05/30/2015  . LLQ pain 03/31/2014  . Menopausal symptoms 03/31/2014  . Hypercholesterolemia 12/31/2012  . Hypothyroidism 12/29/2012  . Osteoarthritis 12/29/2012  . PULMONARY NODULE 01/02/2008  . Mild depression ([place]) 12/30/2007  . Essential hypertension 12/30/2007    Lavayah Vita Donna Rhodes, DPT 10/11/2017, 12:01 PM  [place] MAIN [facility] SERVICES [address] [place], [place], [address] Phone: [phone]   Fax:  [phone]  Name: DARIAN ACE MRN: [id number] Date of Birth: 12/31/1945

## 2017-10-11 NOTE — Addendum Note (Signed)
Addended by: Lucia Bitter on: 10/11/2017 02:26 PM   Modules accepted: Orders

## 2017-10-13 ENCOUNTER — Encounter: Payer: Self-pay | Admitting: Occupational Therapy

## 2017-10-13 ENCOUNTER — Other Ambulatory Visit: Payer: Self-pay | Admitting: Internal Medicine

## 2017-10-13 ENCOUNTER — Encounter: Payer: Self-pay | Admitting: Physical Therapy

## 2017-10-13 ENCOUNTER — Ambulatory Visit: Payer: PPO | Admitting: Occupational Therapy

## 2017-10-13 ENCOUNTER — Ambulatory Visit: Payer: PPO | Admitting: Physical Therapy

## 2017-10-13 DIAGNOSIS — R278 Other lack of coordination: Secondary | ICD-10-CM

## 2017-10-13 DIAGNOSIS — M6281 Muscle weakness (generalized): Secondary | ICD-10-CM

## 2017-10-13 DIAGNOSIS — R2689 Other abnormalities of gait and mobility: Secondary | ICD-10-CM

## 2017-10-13 NOTE — Therapy (Signed)
[place] MAIN [facility] SERVICES [address] [place], [place], [address] Phone: [phone]   Fax:  [phone]  Occupational Therapy Treatment  Patient Details  Name: Donna Rhodes MRN: [id number] Date of Birth: 11-15-1946 Referring Provider: Dr. Letta Pate  Encounter Date: 10/13/2017      OT End of Session - 10/13/17 1111    Visit Number 21   Number of Visits 48   Date for OT Re-Evaluation 10/13/17   Authorization Type Medicare G Code 1 of 10   OT Start Time 0915   OT Stop Time 1000   OT Time Calculation (min) 45 min   Activity Tolerance Patient tolerated treatment well   Behavior During Therapy [facility] for tasks assessed/performed      Past Medical History:  Diagnosis Date  . Atrophic vaginitis   . Dysplasia of vagina    vin 1 - vulva and vaginal cuff  . GERD (gastroesophageal reflux disease)   . HCV infection   . HSV (herpes simplex virus) infection   . Hypercholesterolemia   . Hypertension   . Hypothyroidism   . Lung nodule    stable  . Menopausal state   . Osteoarthritis    hand involvement, cervical disc disease  . Osteoporosis   . Pelvic pain in female   . Rheumatoid arthritis ([place])   . Stroke (cerebrum) ([place]) 04/24/2017  . Thyroid nodule   . Vaginal Pap smear, abnormal    lgsil    Past Surgical History:  Procedure Laterality Date  . ADENOIDECTOMY    . APPENDECTOMY  1965  . [place]   left  . CHOLECYSTECTOMY    . [place]  . laser vein surgery    . LOOP RECORDER INSERTION N/A 09/24/2017   Procedure: LOOP RECORDER INSERTION;  Surgeon: Evans Lance, MD;  Location: [place] [facility];  Service: Cardiovascular;  Laterality: N/A;  . MANDIBLE RECONSTRUCTION  1992  . TOENAIL EXCISION  1998   several  . [place]  . VAGINAL HYSTERECTOMY  1983   als0- anterior/posterior colporrhaphy  . VAGINAL WOUND CLOSURE / REPAIR  1999, 2000  . VAGINAL WOUND CLOSURE / REPAIR      There  were no vitals filed for this visit.      Subjective Assessment - 10/13/17 1108    Subjective  Pt. reports having pain in her LUE during the night.   Patient is accompained by: Family member   Pertinent History Pt. is a 71 y.o. female who had a CVA on 04/24/2017. Pt. went to the ER when she started not feeling well, and having heart palpitations. Pt. reports having work up initially, however was sent home. Pt. reports later in the day she was unable to write with her left hand. Pt. returned to the ER, and was admitted with workup for a CVA. Pt. was admitted to [facility], and stayed for approximately 3 weeks. Pt. returned home alone, and  received home health services for the past 2 months. Pt. now presents for Outpatient OT services. Pt. resides home alone, and has support children who stop by daily, assist with shopping, transportation, and  ADL/IADL tasks as needed.    Limitations Pt. is unable to use her dominant RUE during functional tasks.   Patient Stated Goals To get back the way she was. (Independent)   Currently in Pain? Yes   Pain Score 5    Pain Location Arm   Pain  Orientation Left   Pain Descriptors / Indicators Cramping   Pain Type Neuropathic pain;Chronic pain      OT TREATMENT    Therapeutic Exercise:  Pt. tolerated increased ROM today with less pain (5/10) at the end ranges. Pt. presented with AAROM for shoulder abduction in gravity eliminated. Pt. tolerated shoulder PROM for flexion, extension, horizontal abduction, external rotation, elbow flexion, extension, forearm supination, wrist extension, and digit extension. Pt. Worked on active shoulder flexion, abduction, elbow extension, forearm supination, and wrist extension. Pt. Education was provided bout ROM, proper ROM techniques, performing ROM slowly, and position of UE during ROM. Pt. Tolerated moist heat modality to the shoulder prior to proximal UE ROM, during distal ROM.  Manual Therapy:  Pt.  Tolerated scapular mobilizations in elevation, depression, abduction, and rotation to normalize tone, and prepare the LUE for ROM.                            OT Education - 10/13/17 1110    Education provided Yes   Education Details RUE strength, and coordination skills.   Person(s) Educated Patient   Methods Explanation;Demonstration;Verbal cues   Comprehension Verbalized understanding;Returned demonstration;Verbal cues required;Need further instruction             OT Long Term Goals - 10/11/17 1219      OT LONG TERM GOAL #1   Title Pt. will tolerate increased shoulder PROM by 20 degrees with pain improved to under 3/10   Baseline Flexion: 91, Abduction: 67 10/11/2017: flexion: 108, Abduction: 77   Time 12   Period Weeks   Status On-going   Target Date 01/03/18     OT LONG TERM GOAL #2   Title Pt. will formulate a full fist to be able to stabilize objects with her Left hand while using her right hand.    Baseline Digit flexion to [facility]: 2nd digit: 4cm, 3rd: 5.5, 4th: 6, 5th: 4.   Time 12   Period Weeks   Status On-going   Target Date 01/03/18     OT LONG TERM GOAL #3   Title Pt. will be able to wipe face independently with her Left UE.   Baseline Pt. is unable 10/11/2017: hand-over hand assist required   Period Weeks   Status On-going   Target Date 01/03/18     OT LONG TERM GOAL #4   Title Pt. will demonstrate independence with splint application.   Baseline Pt. is unable to donn splint 10/11/2017: assist required   Time 12   Period Weeks   Status On-going   Target Date 01/03/18     OT LONG TERM GOAL #5   Title Pt. will initiate active grasp release with thumb to 2nd digit 100% of the time to be able to hold a utensil in preparation for self-feeding.   Baseline Minimal thumb movement. 10/11/2018: Pt. conitnues to have limited thumb motion.   Time 12   Period Weeks   Status On-going   Target Date 01/03/18     OT LONG TERM GOAL #6   Title  Pt. will initiate wrist extension in preparation for functional reaching.   Baseline No active wrist extension. 10/11/2018: inconsistently initiating wrist extension.   Time 12   Period Weeks   Status On-going   Target Date 01/03/18               Plan - 10/13/17 1111    Clinical Impression Statement Pt. reports she had  LUE pain last night. Pt. had less pain (5/10) today with ROM, and was able to tolerate increased ROM, and was able to initiate AROM for abduction in gravity eliminated plane. Pt. continues to require cues for redirection.    Occupational performance deficits (Please refer to evaluation for details): ADL's;IADL's   Rehab Potential Good   OT Frequency 2x / week   OT Duration 12 weeks   OT Treatment/Interventions Self-care/ADL training;DME and/or AE instruction;Energy conservation;Therapeutic exercise;Neuromuscular education;Moist Heat;Therapeutic activities;Cognitive remediation/compensation;Therapeutic exercises;Patient/family education   Clinical Decision Making Several treatment options, min-mod task modification necessary   Consulted and Agree with Plan of Care Patient      Patient will benefit from skilled therapeutic intervention in order to improve the following deficits and impairments:  Decreased cognition, Difficulty walking, Decreased balance, Decreased strength, Decreased safety awareness, Decreased coordination, Impaired UE functional use, Impaired tone, Decreased range of motion, Decreased endurance, Decreased activity tolerance, Impaired perceived functional ability, Pain  Visit Diagnosis: Muscle weakness (generalized)  Other lack of coordination    Problem List Patient Active Problem List   Diagnosis Date Noted  . Adhesive capsulitis of left shoulder 08/06/2017  . Urinary frequency 07/11/2017  . Gait disturbance, post-stroke 05/31/2017  . Left spastic hemiparesis ([place]) 05/31/2017  . Adjustment disorder with mixed anxiety and depressed mood   .  Hypokalemia   . HCAP (healthcare-associated pneumonia)   . Cough with fever   . 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 FUO (fever of unknown origin)   . Oral thrush   . Benign essential HTN   . Rheumatoid arthritis involving both hands ([place]) 04/28/2017  . Right-sided lacunar infarction 04/27/2017  . CVA (cerebral vascular accident) ([place]) 04/24/2017  . LGSIL Pap smear of vagina 12/15/2016  . Anal skin tag 02/02/2016  . Inflammatory arthritis 01/19/2016  . Right knee pain 01/19/2016  . Genital herpes 10/20/2015  . GERD (gastroesophageal reflux disease) 10/20/2015  . Status post vaginal hysterectomy 10/20/2015  . Health care maintenance 05/30/2015  . LLQ pain 03/31/2014  . Menopausal symptoms 03/31/2014  . Hypercholesterolemia 12/31/2012  . Hypothyroidism 12/29/2012  . Osteoarthritis 12/29/2012  . PULMONARY NODULE 01/02/2008  . Mild depression ([place]) 12/30/2007  . Essential hypertension 12/30/2007    Harrel Carina, MS, OTR/L 10/13/2017, 11:33 AM  [place] MAIN [facility] SERVICES [address] [place], [place], [address] Phone: [phone]   Fax:  [phone]  Name: Donna Rhodes MRN: [id number] Date of Birth: 12/19/1946

## 2017-10-13 NOTE — Therapy (Signed)
[place] MAIN [facility] SERVICES [address] [place], [place], [address] Phone: [phone]   Fax:  [phone]  Physical Therapy Treatment  Patient Details  Name: Donna Rhodes MRN: [id number] Date of Birth: [birth year] Referring Provider: Alysia Penna  Encounter Date: 10/13/2017      PT End of Session - 10/13/17 1007    Visit Number 18   Number of Visits 41   Date for PT Re-Evaluation 12/08/17   Authorization Type gcode [id number]   Authorization Time Period 10   PT Start Time 1000   PT Stop Time 1045   PT Time Calculation (min) 45 min   Equipment Utilized During Treatment Gait belt   Activity Tolerance Patient tolerated treatment well;No increased pain   Behavior During Therapy WFL for tasks assessed/performed      Past Medical History:  Diagnosis Date  . Atrophic vaginitis   . Dysplasia of vagina    vin 1 - vulva and vaginal cuff  . GERD (gastroesophageal reflux disease)   . HCV infection   . HSV (herpes simplex virus) infection   . Hypercholesterolemia   . Hypertension   . Hypothyroidism   . Lung nodule    stable  . Menopausal state   . Osteoarthritis    hand involvement, cervical disc disease  . Osteoporosis   . Pelvic pain in female   . Rheumatoid arthritis ([place])   . Stroke (cerebrum) ([place]) 04/24/2017  . Thyroid nodule   . Vaginal Pap smear, abnormal    lgsil    Past Surgical History:  Procedure Laterality Date  . ADENOIDECTOMY    . APPENDECTOMY  1965  . [place]   left  . CHOLECYSTECTOMY    . [place]  . laser vein surgery    . LOOP RECORDER INSERTION N/A 09/24/2017   Procedure: LOOP RECORDER INSERTION;  Surgeon: Evans Lance, MD;  Location: [place] [facility];  Service: Cardiovascular;  Laterality: N/A;  . MANDIBLE RECONSTRUCTION  1992  . TOENAIL EXCISION  1998   several  . [place]  . VAGINAL HYSTERECTOMY  1983   als0- anterior/posterior colporrhaphy  . VAGINAL  WOUND CLOSURE / REPAIR  1999, 2000  . VAGINAL WOUND CLOSURE / REPAIR      There were no vitals filed for this visit.      Subjective Assessment - 10/13/17 1006    Subjective Patient reports increased fatigue today; she reports, "These appointments are too early especially if you don't sleep at night." Patient reports having some arm pain this morning but states that it is better after working with OT.    Patient is accompained by: Family member   Pertinent History Pt is a 71 y.o female that presents to therapy s/p CVA; On April 24, 2017 pt suffered an acute infarct in right basal ganglia. She was in hospital 3 days; after pt was transferred to [facility] where she did inpatient therapy; Pt then completed home PT until about 2 weeks ago; Pt has pain in shoulder when moving it with R UE and reports that deltoid is paralyzed; Pt fell out of bed one day and fell on L UE; Pt reported doctor put a shot in L shoulder and will receive 3 more shots in shoulder; Pt reports no sensation or numbness and tingling with LE just some sensation impaired in L hand; Pt has an air cast for L LE and splint for wrist but does not use  because difficulty donning;  Pt has had trouble sleeping at night with waking and going to the bathroom but denies any pain;  Only has pain in shoulder when moving described as throbbing; Pt reports not using her cane in the last month and states her balance is fine without it;    Limitations Lifting;Standing;Walking   How long can you sit comfortably? hours    How long can you stand comfortably? 30 min    How long can you walk comfortably? better in the morning because more energy;    Diagnostic tests x rays in shoulder and hand; all clear; doctor stated "inferior pseudo-subluxation from paralysis of the deltoid. she has advanced left hand arthritis"   Patient Stated Goals walking better without the cane;    Currently in Pain? No/denies   Pain Onset More than a month ago          TREATMENT: Warm up on Nustep RUE and BLE level 2 x4 min (unbilled);   Standing with back against wall: LLE terminal knee extension against wall with small green ball 5 sec hold x10; Mini squat against wall with small ball between knees for better quad control x10 reps;   Long Sitting LLE ankle DF green tband 2x15, EV: red tband 2x15 each with min VCs to isolate ankle ROM and increase ROM for better strengthening; LLE knee SAQ 2#, 2x15 with cues to avoid ankle IV and to slow down for better quad contraction Long sitting LLE calf stretch with ankle DF/slight EV for better stretch in ankle;   Supine: D2 LLE Hip flexion/extension with emphasis on left ankle eversion when going into extension, abduction and IR 2x10 with manual resistance;  HOIST hamstring curl: BLE plate #4, [id number] LLE only plate #1, [id number] with cues to avoid ankle IV and improve knee flexion for better hamstring strengthening;   Patient denies any increase in pain with advanced exercise                          PT Education - 10/13/17 1007    Education provided Yes   Education Details LE strengthening, balance, gait safety;    Person(s) Educated Patient   Methods Explanation;Demonstration;Verbal cues   Comprehension Verbalized understanding;Returned demonstration;Verbal cues required;Need further instruction             PT Long Term Goals - 09/15/17 1143      PT LONG TERM GOAL #1   Title Pt will become independent with HEP in order to improve ADL independence;   Baseline Pt performs several exercises frequently, but inconsistently performs other exercises.    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 on 8/28   Time 12  Period Weeks   Status Revised   Target Date 12/08/17     PT LONG TERM GOAL #4   Title Pt will improve 6MWT to >1300 feet in order to be considered 80% of age group norm and to improve activity tolerance and community ambulation distance for improved IADLs (revised from 1000")   Baseline 565 feet, improved to 875' on 8/28, improved to 1105' on 9/19   Time 12   Period Weeks   Status Revised   Target Date 12/08/17     PT LONG TERM GOAL #5   Title Pt will increase strength in L LE to 4+/5 for all muscles in order to improve mobility and function with daily activities;    Baseline pt is 4+/5 in all LE except for L hip abduction: 4/5   Time 12   Period Weeks   Status Partially Met   Target Date 12/08/17     PT LONG TERM GOAL #6   Title Pt will ambulate with equal stride length, no toe drag on LLE, and equal stance and swing time bilaterally to demonstrate improved gait mechanics for more efficient and functional mobility.   Time 12   Period Weeks   Status New   Target Date 12/08/17               Plan - 10/13/17 1314    Clinical Impression Statement Patient instructed in advanced LE strengthening focusing on LLE. Patient requires min Vcs for correct exercise technique. She does exhibit increased ankle IV during hamstring curl due to spasticity. patient would benefit from additional skilled PT intervention to improve strength, balance and gait safety;    Rehab Potential Fair   Clinical Impairments Affecting Rehab Potential neg: age, lives alone, comorbidities, pos: wants to be more independent; already high functioning    PT Frequency 2x / week   PT Duration 12 weeks   PT  Treatment/Interventions ADLs/Self Care Home Management;Aquatic Therapy;Moist Heat;Cryotherapy;DME Instruction;Gait training;Stair training;Functional mobility training;Therapeutic activities;Therapeutic exercise;Balance training;Neuromuscular re-education;Manual techniques;Passive range of motion;Energy conservation;Electrical Stimulation;Patient/family education;Orthotic Fit/Training   PT Next Visit Plan berg; stairs; strengthening, Assess adductor tightness   PT Home Exercise Plan continue as given;    Consulted and Agree with Plan of Care Patient      Patient will benefit from skilled therapeutic intervention in order to improve the following deficits and impairments:  Abnormal gait, Impaired sensation, Decreased mobility, Decreased activity tolerance, Decreased endurance, Decreased range of motion, Decreased strength, Impaired UE functional use, Decreased balance, Decreased safety awareness, Difficulty walking, Pain  Visit Diagnosis: Muscle weakness (generalized)  Other lack of coordination  Other abnormalities of gait and mobility     Problem List Patient Active Problem List   Diagnosis Date Noted  . Adhesive capsulitis of left shoulder 08/06/2017  . Urinary frequency 07/11/2017  . Gait disturbance, post-stroke 05/31/2017  . Left spastic hemiparesis ([place]) 05/31/2017  . Adjustment disorder with mixed anxiety and depressed mood   . Hypokalemia   . HCAP (healthcare-associated pneumonia)   . Cough with fever   . 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 FUO (fever of unknown origin)   . Oral thrush   . Benign essential HTN   . Rheumatoid arthritis involving both hands ([place]) 04/28/2017  . Right-sided lacunar infarction 04/27/2017  . CVA (cerebral vascular accident) ([place]) 04/24/2017  . LGSIL Pap smear of vagina 12/15/2016  . Anal skin tag 02/02/2016  . Inflammatory arthritis 01/19/2016  . Right knee pain 01/19/2016  . Genital herpes  10/20/2015  . GERD (gastroesophageal  reflux disease) 10/20/2015  . Status post vaginal hysterectomy 10/20/2015  . Health care maintenance 05/30/2015  . LLQ pain 03/31/2014  . Menopausal symptoms 03/31/2014  . Hypercholesterolemia 12/31/2012  . Hypothyroidism 12/29/2012  . Osteoarthritis 12/29/2012  . PULMONARY NODULE 01/02/2008  . Mild depression ([place]) 12/30/2007  . Essential hypertension 12/30/2007    Belinda Schlichting PT, DPT 10/13/2017, 1:17 PM  [place] MAIN [facility] SERVICES [address] [place], [place], [address] Phone: [phone]   Fax:  [phone]  Name: Donna Rhodes MRN: [id number] Date of Birth: 07-10-1946

## 2017-10-18 ENCOUNTER — Ambulatory Visit: Payer: PPO | Admitting: Occupational Therapy

## 2017-10-19 DIAGNOSIS — H2513 Age-related nuclear cataract, bilateral: Secondary | ICD-10-CM | POA: Diagnosis not present

## 2017-10-20 ENCOUNTER — Ambulatory Visit: Payer: PPO | Admitting: Physical Therapy

## 2017-10-20 ENCOUNTER — Encounter: Payer: Self-pay | Admitting: *Deleted

## 2017-10-20 ENCOUNTER — Other Ambulatory Visit: Payer: Self-pay | Admitting: Internal Medicine

## 2017-10-20 ENCOUNTER — Encounter: Payer: Self-pay | Admitting: Physical Therapy

## 2017-10-20 ENCOUNTER — Ambulatory Visit: Payer: PPO | Admitting: Occupational Therapy

## 2017-10-20 DIAGNOSIS — R2689 Other abnormalities of gait and mobility: Secondary | ICD-10-CM

## 2017-10-20 DIAGNOSIS — M6281 Muscle weakness (generalized): Secondary | ICD-10-CM | POA: Diagnosis not present

## 2017-10-20 DIAGNOSIS — R278 Other lack of coordination: Secondary | ICD-10-CM

## 2017-10-20 NOTE — Therapy (Signed)
[place] MAIN [facility] SERVICES [address] [place], [place], [address] Phone: [phone]   Fax:  [phone]  Physical Therapy Treatment  Patient Details  Name: Donna Rhodes MRN: [id number] Date of Birth: [birth year] Referring Provider: Alysia Penna  Encounter Date: 10/20/2017      PT End of Session - 10/20/17 1030    Visit Number 19   Number of Visits 41   Date for PT Re-Evaluation 12/08/17   Authorization Type gcode 9   Authorization Time Period 10   PT Start Time 1015   PT Stop Time 1100   PT Time Calculation (min) 45 min   Equipment Utilized During Treatment Gait belt   Activity Tolerance Patient tolerated treatment well;No increased pain   Behavior During Therapy WFL for tasks assessed/performed      Past Medical History:  Diagnosis Date  . Atrophic vaginitis   . Dysplasia of vagina    vin 1 - vulva and vaginal cuff  . GERD (gastroesophageal reflux disease)   . HCV infection   . HSV (herpes simplex virus) infection   . Hypercholesterolemia   . Hypertension   . Hypothyroidism   . Lung nodule    stable  . Menopausal state   . Osteoarthritis    hand involvement, cervical disc disease  . Osteoporosis   . Pelvic pain in female   . Rheumatoid arthritis ([place])   . Stroke (cerebrum) ([place]) 04/24/2017  . Thyroid nodule   . Vaginal Pap smear, abnormal    lgsil    Past Surgical History:  Procedure Laterality Date  . ADENOIDECTOMY    . APPENDECTOMY  1965  . [place]   left  . CHOLECYSTECTOMY    . [place]  . laser vein surgery    . LOOP RECORDER INSERTION N/A 09/24/2017   Procedure: LOOP RECORDER INSERTION;  Surgeon: Evans Lance, MD;  Location: [place] CV LAB;  Service: Cardiovascular;  Laterality: N/A;  . MANDIBLE RECONSTRUCTION  1992  . TOENAIL EXCISION  1998   several  . [place]  . VAGINAL HYSTERECTOMY  1983   als0- anterior/posterior colporrhaphy  . VAGINAL  WOUND CLOSURE / REPAIR  1999, 2000  . VAGINAL WOUND CLOSURE / REPAIR      There were no vitals filed for this visit.      Subjective Assessment - 10/20/17 1029    Subjective Patient reports increased soreness today which could be related to colder weather. "I think my arthritis is acting up."    Patient is accompained by: Family member   Pertinent History Pt is a 71 y.o female that presents to therapy s/p CVA; On April 24, 2017 pt suffered an acute infarct in right basal ganglia. She was in hospital 3 days; after pt was transferred to [facility] where she did inpatient therapy; Pt then completed home PT until about 2 weeks ago; Pt has pain in shoulder when moving it with R UE and reports that deltoid is paralyzed; Pt fell out of bed one day and fell on L UE; Pt reported doctor put a shot in L shoulder and will receive 3 more shots in shoulder; Pt reports no sensation or numbness and tingling with LE just some sensation impaired in L hand; Pt has an air cast for L LE and splint for wrist but does not use because difficulty donning;  Pt has had trouble sleeping at night with waking and going to the bathroom  but denies any pain;  Only has pain in shoulder when moving described as throbbing; Pt reports not using her cane in the last month and states her balance is fine without it;    Limitations Lifting;Standing;Walking   How long can you sit comfortably? hours    How long can you stand comfortably? 30 min    How long can you walk comfortably? better in the morning because more energy;    Diagnostic tests x rays in shoulder and hand; all clear; doctor stated "inferior pseudo-subluxation from paralysis of the deltoid. she has advanced left hand arthritis"   Patient Stated Goals walking better without the cane;    Currently in Pain? Yes   Pain Score 6    Pain Location Other (Comment)  all over   Pain Descriptors / Indicators Aching;Sore   Pain Type Chronic pain   Pain Onset More than a month ago    Pain Frequency Intermittent   Aggravating Factors  worse with activity; standing/walking   Pain Relieving Factors less pain after stretch; rest/moist heat;    Effect of Pain on Daily Activities decreased activity tolerance;    Multiple Pain Sites No          TREATMENT: Warm up on Nustep RUE and BLE level 2 x4 min (unbilled)concurrent with moist heat to low back;  Long Sitting concurrent with moist heat against LLE hamstrings in long sitting:  LLE ankle DF green tband 2x15 with min VCs to isolate ankle ROM and increase ROM for better strengthening; LLE ankle EV green tband 2x15; required cues to increase ankle ROM against resistance for better strengthening;  Long sitting LLE calf stretch with ankle DF/slight EV for better stretch in ankle 20 sec hold x2;   HOIST Leg press, LLE only plate #2 (35#) [id number] with min A to avoid knee genu recuvatum and cues to keep knee in line with hip for less hip ER/abduction to improve quad control/strengthening;  Seated hamstring curl, green tband 2x15 bilaterally; Patient exhibits increased LLE flexor tone during RLE movement requiring min A to reduce activation and isolate RLE movement; She required min VCs to increase ROM for better strengthening;   Standing: Side step over 1/2 bolster (hip abduction) with green tband x10 each direction with cues to increase ROM for better hip strengthening;   Patient denies any increase in pain with advanced exercise; She does exhibit increased fatigue; Patient does exhibit multiple trigger points in LUE and LLE hamstrings; consider dry needling to reduce tightness;                            PT Education - 10/20/17 1030    Education provided Yes   Education Details LE strengthening,gait safety;    Person(s) Educated Patient   Methods Explanation;Demonstration;Verbal cues   Comprehension Verbalized understanding;Returned demonstration;Verbal cues required;Need further instruction              PT Long Term Goals - 10/20/17 1104      PT LONG TERM GOAL #1   Title Pt will become independent with HEP in order to improve ADL independence;   Baseline Pt performs several exercises frequently, but inconsistently performs other exercises.    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   Time 12   Period Weeks   Status Partially Met   Target Date 12/08/17     PT LONG TERM GOAL #4   Title Pt will improve 6MWT to >1300 feet in order to be considered 80% of age group norm and to improve activity tolerance and community ambulation distance for improved IADLs (revised from 1000")   Baseline 565 feet, improved to 875' on 8/28, improved to 1105' on 9/19   Time 12   Period Weeks   Status Partially Met   Target Date 12/08/17     PT LONG TERM GOAL #5   Title Pt will increase strength in L LE to 4+/5 for all muscles in order to improve mobility and function with daily activities;    Baseline pt is 4+/5 in all LE except for L hip abduction: 4/5   Time 12   Period Weeks   Status Partially Met   Target Date 12/08/17     PT LONG TERM GOAL #6   Title Pt will ambulate with equal stride length, no toe drag on LLE, and equal stance and swing time bilaterally to demonstrate improved gait mechanics for more efficient and functional mobility.   Time 12   Period Weeks   Status Partially Met   Target Date 12/08/17                Plan - 10/20/17 1102    Clinical Impression Statement Patient reports increased soreness today due to arthritis. PT instructed patient in advanced LE strengthening exercise, concurrent with moist heat to low back/left leg to reduce stiffness. Patient does require min VCs for correct exercise technique. she reports increased fatigue at end of session but reports less discomfort and less stiffness. Patient would benefit from additional skilled PT intervention to improve strength and mobility; consider dry needling for trigger points in UE/LEs. Patient will be having cataract surgery and will be on hold for a few weeks to avoid excessive activity per restrictions.    Rehab Potential Fair   Clinical Impairments Affecting Rehab Potential neg: age, lives alone, comorbidities, pos: wants to be more independent; already high functioning    PT Frequency 2x / week   PT Duration 12 weeks   PT Treatment/Interventions ADLs/Self Care Home Management;Aquatic Therapy;Moist Heat;Cryotherapy;DME Instruction;Gait training;Stair training;Functional mobility training;Therapeutic activities;Therapeutic exercise;Balance training;Neuromuscular re-education;Manual techniques;Passive range of motion;Energy conservation;Electrical Stimulation;Patient/family education;Orthotic Fit/Training;Dry needling   PT Next Visit Plan berg; stairs; strengthening, Assess adductor tightness   PT Home Exercise Plan continue as given;    Consulted and Agree with Plan of Care Patient      Patient will benefit from skilled therapeutic intervention in order to improve the following deficits and impairments:  Abnormal gait, Impaired sensation, Decreased mobility, Decreased activity tolerance, Decreased endurance, Decreased range of motion, Decreased strength, Impaired UE functional use, Decreased balance, Decreased safety awareness, Difficulty walking, Pain  Visit Diagnosis: Muscle weakness (generalized) - Plan: PT plan of  care cert/re-cert  Other lack of coordination - Plan: PT plan of care cert/re-cert  Other abnormalities of gait and mobility - Plan: PT plan of care cert/re-cert     Problem List Patient Active Problem List   Diagnosis Date Noted  . Adhesive capsulitis of left shoulder 08/06/2017  . Urinary frequency 07/11/2017  . Gait disturbance, post-stroke 05/31/2017  . Left spastic hemiparesis ([place]) 05/31/2017  . Adjustment disorder with mixed anxiety and depressed mood   . Hypokalemia   . HCAP (healthcare-associated pneumonia)   . Cough with fever   . 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 FUO (fever of unknown origin)   . Oral thrush   . Benign essential HTN   . Rheumatoid arthritis involving both hands ([place]) 04/28/2017  . Right-sided lacunar infarction 04/27/2017  . CVA (cerebral vascular accident) ([place]) 04/24/2017  . LGSIL Pap smear of vagina 12/15/2016  . Anal skin tag 02/02/2016  . Inflammatory arthritis 01/19/2016  . Right knee pain 01/19/2016  . Genital herpes 10/20/2015  . GERD (gastroesophageal reflux disease) 10/20/2015  . Status post vaginal hysterectomy 10/20/2015  . Health care maintenance 05/30/2015  . LLQ pain 03/31/2014  . Menopausal symptoms 03/31/2014  . Hypercholesterolemia 12/31/2012  . Hypothyroidism 12/29/2012  . Osteoarthritis 12/29/2012  . PULMONARY NODULE 01/02/2008  . Mild depression ([place]) 12/30/2007  . Essential hypertension 12/30/2007    Donna Rhodes PT, DPT 10/20/2017, 11:06 AM  [place] MAIN [facility] SERVICES [address] [place], [place], [address] Phone: [phone]   Fax:  [phone]  Name: Donna Rhodes MRN: [id number] Date of Birth: 1946/09/30

## 2017-10-20 NOTE — Therapy (Signed)
[place] MAIN [facility] SERVICES [address] [place], [place], [address] Phone: [phone]   Fax:  [phone]  Occupational Therapy Treatment  Patient Details  Name: Donna Rhodes MRN: [id number] Date of Birth: [birth year] Referring Provider: Dr. Letta Pate  Encounter Date: 10/20/2017      OT End of Session - 10/20/17 1216    Visit Number 22   Number of Visits 48   Date for OT Re-Evaluation 10/13/17   Authorization Type Medicare G Code 2 of 10   OT Start Time 1100   OT Stop Time 1145   OT Time Calculation (min) 45 min   Activity Tolerance Patient tolerated treatment well   Behavior During Therapy [facility] for tasks assessed/performed      Past Medical History:  Diagnosis Date  . Atrophic vaginitis   . Dysplasia of vagina    vin 1 - vulva and vaginal cuff  . GERD (gastroesophageal reflux disease)   . HCV infection   . HSV (herpes simplex virus) infection   . Hypercholesterolemia   . Hypertension   . Hypothyroidism   . Lung nodule    stable  . Menopausal state   . Osteoarthritis    hand involvement, cervical disc disease  . Osteoporosis   . Pelvic pain in female   . Rheumatoid arthritis ([place])   . Stroke (cerebrum) ([place]) 04/24/2017  . Thyroid nodule   . Vaginal Pap smear, abnormal    lgsil    Past Surgical History:  Procedure Laterality Date  . ADENOIDECTOMY    . APPENDECTOMY  1965  . [place]   left  . CHOLECYSTECTOMY    . [place]  . laser vein surgery    . LOOP RECORDER INSERTION N/A 09/24/2017   Procedure: LOOP RECORDER INSERTION;  Surgeon: Evans Lance, MD;  Location: [place] CV LAB;  Service: Cardiovascular;  Laterality: N/A;  . MANDIBLE RECONSTRUCTION  1992  . TOENAIL EXCISION  1998   several  . [place]  . VAGINAL HYSTERECTOMY  1983   als0- anterior/posterior colporrhaphy  . VAGINAL WOUND CLOSURE / REPAIR  1999, 2000  . VAGINAL WOUND CLOSURE / REPAIR      There  were no vitals filed for this visit.      Subjective Assessment - 10/20/17 1214    Subjective  Pt. plans to have catarct surgery.   Patient is accompained by: Family member   Pertinent History Pt. is a 71 y.o. female who had a CVA on 04/24/2017. Pt. went to the ER when she started not feeling well, and having heart palpitations. Pt. reports having work up initially, however was sent home. Pt. reports later in the day she was unable to write with her left hand. Pt. returned to the ER, and was admitted with workup for a CVA. Pt. was admitted to [facility], and stayed for approximately 3 weeks. Pt. returned home alone, and  received home health services for the past 2 months. Pt. now presents for Outpatient OT services. Pt. resides home alone, and has support children who stop by daily, assist with shopping, transportation, and  ADL/IADL tasks as needed.    Patient Stated Goals To get back the way she was. (Independent)   Currently in Pain? Yes   Pain Score 4    Pain Location Arm   Pain Orientation Left       OT TREATMENT    Neuro muscular re-education:  Pt.  Worked on tasks, and movements to initiate reach. Pt. worked on light weightbearing   Therapeutic Exercise:   Pt. tolerated increased ROM today with less pain (4/10) at the end ranges. Pt. presented with AAROM for shoulder abduction in gravity eliminated. Pt. tolerated shoulder PROM for flexion, extension, horizontal abduction, external rotation, elbow flexion, extension, forearm supination, wrist extension, and digit extension. Pt. Worked on active shoulder flexion, abduction, elbow extension, forearm supination, and wrist extension. Pt. education was provided bout ROM, proper ROM techniques, performing ROM slowly, and position of UE during ROM.   Manual Therapy:  Pt. Tolerated scapular mobilizations in elevation, depression, abduction, and rotation to normalize tone, and prepare the LUE for  ROM.                                OT Education - 10/20/17 1215    Education provided Yes   Education Details UE functioning   Person(s) Educated Patient   Methods Explanation;Demonstration;Verbal cues   Comprehension Verbalized understanding;Verbal cues required;Need further instruction;Returned demonstration             OT Long Term Goals - 10/11/17 1219      OT LONG TERM GOAL #1   Title Pt. will tolerate increased shoulder PROM by 20 degrees with pain improved to under 3/10   Baseline Flexion: 91, Abduction: 67 10/11/2017: flexion: 108, Abduction: 77   Time 12   Period Weeks   Status On-going   Target Date 01/03/18     OT LONG TERM GOAL #2   Title Pt. will formulate a full fist to be able to stabilize objects with her Left hand while using her right hand.    Baseline Digit flexion to [facility]: 2nd digit: 4cm, 3rd: 5.5, 4th: 6, 5th: 4.   Time 12   Period Weeks   Status On-going   Target Date 01/03/18     OT LONG TERM GOAL #3   Title Pt. will be able to wipe face independently with her Left UE.   Baseline Pt. is unable 10/11/2017: hand-over hand assist required   Period Weeks   Status On-going   Target Date 01/03/18     OT LONG TERM GOAL #4   Title Pt. will demonstrate independence with splint application.   Baseline Pt. is unable to donn splint 10/11/2017: assist required   Time 12   Period Weeks   Status On-going   Target Date 01/03/18     OT LONG TERM GOAL #5   Title Pt. will initiate active grasp release with thumb to 2nd digit 100% of the time to be able to hold a utensil in preparation for self-feeding.   Baseline Minimal thumb movement. 10/11/2018: Pt. conitnues to have limited thumb motion.   Time 12   Period Weeks   Status On-going   Target Date 01/03/18     OT LONG TERM GOAL #6   Title Pt. will initiate wrist extension in preparation for functional reaching.   Baseline No active wrist extension. 10/11/2018: inconsistently  initiating wrist extension.   Time 12   Period Weeks   Status On-going   Target Date 01/03/18               Plan - 10/20/17 1216    Clinical Impression Statement Pt. presents with less pain in her LUE today. Pt. plans to have two cataract surgeries over the next 3 weeks. Pt. reports being interested in pursuing dry  needling. Pt. is making progress, with imcreased movement in the LUE, and less pain reported today. Pt. conitnues to require consistent cues, and cues for reinforcement of information provided. Pt. requires encouragement, and redirection at times throughout the session.     Occupational performance deficits (Please refer to evaluation for details): ADL's;IADL's   OT Frequency 2x / week   OT Duration 12 weeks   OT Treatment/Interventions Self-care/ADL training;DME and/or AE instruction;Energy conservation;Therapeutic exercise;Neuromuscular education;Moist Heat;Therapeutic activities;Cognitive remediation/compensation;Therapeutic exercises;Patient/family education   Consulted and Agree with Plan of Care Patient      Patient will benefit from skilled therapeutic intervention in order to improve the following deficits and impairments:  Decreased cognition, Difficulty walking, Decreased balance, Decreased strength, Decreased safety awareness, Decreased coordination, Impaired UE functional use, Impaired tone, Decreased range of motion, Decreased endurance, Decreased activity tolerance, Impaired perceived functional ability, Pain  Visit Diagnosis: Muscle weakness (generalized)  Other lack of coordination    Problem List Patient Active Problem List   Diagnosis Date Noted  . Adhesive capsulitis of left shoulder 08/06/2017  . Urinary frequency 07/11/2017  . Gait disturbance, post-stroke 05/31/2017  . Left spastic hemiparesis ([place]) 05/31/2017  . Adjustment disorder with mixed anxiety and depressed mood   . Hypokalemia   . HCAP (healthcare-associated pneumonia)   . Cough  with fever   . 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 FUO (fever of unknown origin)   . Oral thrush   . Benign essential HTN   . Rheumatoid arthritis involving both hands ([place]) 04/28/2017  . Right-sided lacunar infarction 04/27/2017  . CVA (cerebral vascular accident) ([place]) 04/24/2017  . LGSIL Pap smear of vagina 12/15/2016  . Anal skin tag 02/02/2016  . Inflammatory arthritis 01/19/2016  . Right knee pain 01/19/2016  . Genital herpes 10/20/2015  . GERD (gastroesophageal reflux disease) 10/20/2015  . Status post vaginal hysterectomy 10/20/2015  . Health care maintenance 05/30/2015  . LLQ pain 03/31/2014  . Menopausal symptoms 03/31/2014  . Hypercholesterolemia 12/31/2012  . Hypothyroidism 12/29/2012  . Osteoarthritis 12/29/2012  . PULMONARY NODULE 01/02/2008  . Mild depression ([place]) 12/30/2007  . Essential hypertension 12/30/2007    Harrel Carina, MS, OTR/L 10/20/2017, 12:23 PM  [place] MAIN [facility] SERVICES [address] [place], [place], [address] Phone: [phone]   Fax:  [phone]  Name: Donna Rhodes MRN: [id number] Date of Birth: 1946/02/07

## 2017-10-22 NOTE — Discharge Instructions (Signed)

## 2017-10-25 ENCOUNTER — Ambulatory Visit: Payer: PPO | Admitting: Occupational Therapy

## 2017-10-25 ENCOUNTER — Ambulatory Visit (INDEPENDENT_AMBULATORY_CARE_PROVIDER_SITE_OTHER): Payer: PPO | Admitting: *Deleted

## 2017-10-25 DIAGNOSIS — M6281 Muscle weakness (generalized): Secondary | ICD-10-CM

## 2017-10-25 DIAGNOSIS — I639 Cerebral infarction, unspecified: Secondary | ICD-10-CM | POA: Diagnosis not present

## 2017-10-25 NOTE — Therapy (Signed)
[place] MAIN [facility] SERVICES [address] [place], [place], [address] Phone: [phone]   Fax:  [phone]  Occupational Therapy Treatment  Patient Details  Name: Donna Rhodes MRN: [id number] Date of Birth: [birth year] Referring Provider: Dr. Letta Pate  Encounter Date: 10/25/2017      OT End of Session - 10/25/17 1208    Visit Number 23   Number of Visits 48   Date for OT Re-Evaluation 10/13/17   Authorization Type Medicare G Code 3 of 10   OT Start Time 1115   OT Stop Time 1200   OT Time Calculation (min) 45 min   Activity Tolerance Patient tolerated treatment well   Behavior During Therapy [facility] for tasks assessed/performed      Past Medical History:  Diagnosis Date  . Atrophic vaginitis   . Dysplasia of vagina    vin 1 - vulva and vaginal cuff  . GERD (gastroesophageal reflux disease)   . HCV infection   . HSV (herpes simplex virus) infection   . Hypercholesterolemia   . Hypertension   . Hypothyroidism   . Left-sided weakness    S/P CVA 4/18.  PT 2x/wk.  . Lung nodule    stable  . Menopausal state   . Osteoarthritis    hand involvement, cervical disc disease  . Osteoporosis   . Pelvic pain in female   . Rheumatoid arthritis ([place])   . Stroke (cerebrum) ([place]) 04/24/2017  . Thyroid nodule   . Vaginal Pap smear, abnormal    lgsil    Past Surgical History:  Procedure Laterality Date  . ADENOIDECTOMY    . APPENDECTOMY  1965  . [place]   left  . CHOLECYSTECTOMY    . [place]  . laser vein surgery    . LOOP RECORDER INSERTION N/A 09/24/2017   Procedure: LOOP RECORDER INSERTION;  Surgeon: Evans Lance, MD;  Location: [place] CV LAB;  Service: Cardiovascular;  Laterality: N/A;  . MANDIBLE RECONSTRUCTION  1992  . TOENAIL EXCISION  1998   several  . [place]  . VAGINAL HYSTERECTOMY  1983   als0- anterior/posterior colporrhaphy  . VAGINAL WOUND CLOSURE / REPAIR  1999,  2000  . VAGINAL WOUND CLOSURE / REPAIR      There were no vitals filed for this visit.      Subjective Assessment - 10/25/17 1206    Subjective  Pt. plans to have cataract surgery this week.   Patient is accompained by: Family member   Pertinent History Pt. is a 71 y.o. female who had a CVA on 04/24/2017. Pt. went to the ER when she started not feeling well, and having heart palpitations. Pt. reports having work up initially, however was sent home. Pt. reports later in the day she was unable to write with her left hand. Pt. returned to the ER, and was admitted with workup for a CVA. Pt. was admitted to [facility] and stayed for approximately 3 weeks. Pt. returned home alone, and  received home health services for the past 2 months. Pt. now presents for Outpatient OT services. Pt. resides home alone, and has support children who stop by daily, assist with shopping, transportation, and  ADL/IADL tasks as needed.    Currently in Pain? Yes   Pain Score 4    Pain Location Arm   Pain Orientation Left       OT TREATMENT    Therapeutic Exercise  Pt. tolerated increased ROM today with less pain (4/10) at the end ranges. Pt. presented with AAROM for shoulder abduction in gravity eliminated. Pt. tolerated shoulder PROM for flexion, extension, horizontal abduction, external rotation, elbow flexion, extension, forearm supination, wrist extension, and digit extension. Pt. Worked on active shoulder flexion, abduction, elbow extension, forearm supination, and wrist extension. Pt. education was provided bout ROM, proper ROM techniques, performing ROM slowly, and position of UE during ROM.   Manual Therapy:  Pt. Tolerated scapular mobilizations in elevation, depression, abduction, and rotation to normalize tone, and prepare the LUE for ROM.                                OT Education - 10/25/17 1207    Education provided Yes   Education Details LUE  Functioning.   Person(s) Educated Patient   Methods Explanation;Demonstration;Verbal cues   Comprehension Verbalized understanding;Verbal cues required;Need further instruction;Returned demonstration             OT Long Term Goals - 10/11/17 1219      OT LONG TERM GOAL #1   Title Pt. will tolerate increased shoulder PROM by 20 degrees with pain improved to under 3/10   Baseline Flexion: 91, Abduction: 67 10/11/2017: flexion: 108, Abduction: 77   Time 12   Period Weeks   Status On-going   Target Date 01/03/18     OT LONG TERM GOAL #2   Title Pt. will formulate a full fist to be able to stabilize objects with her Left hand while using her right hand.    Baseline Digit flexion to [facility]: 2nd digit: 4cm, 3rd: 5.5, 4th: 6, 5th: 4.   Time 12   Period Weeks   Status On-going   Target Date 01/03/18     OT LONG TERM GOAL #3   Title Pt. will be able to wipe face independently with her Left UE.   Baseline Pt. is unable 10/11/2017: hand-over hand assist required   Period Weeks   Status On-going   Target Date 01/03/18     OT LONG TERM GOAL #4   Title Pt. will demonstrate independence with splint application.   Baseline Pt. is unable to donn splint 10/11/2017: assist required   Time 12   Period Weeks   Status On-going   Target Date 01/03/18     OT LONG TERM GOAL #5   Title Pt. will initiate active grasp release with thumb to 2nd digit 100% of the time to be able to hold a utensil in preparation for self-feeding.   Baseline Minimal thumb movement. 10/11/2018: Pt. conitnues to have limited thumb motion.   Time 12   Period Weeks   Status On-going   Target Date 01/03/18     OT LONG TERM GOAL #6   Title Pt. will initiate wrist extension in preparation for functional reaching.   Baseline No active wrist extension. 10/11/2018: inconsistently initiating wrist extension.   Time 12   Period Weeks   Status On-going   Target Date 01/03/18               Plan - 10/25/17 1208     Clinical Impression Statement Pt. reports having cataract surgery this week, and again in two weeks. Pt. reports that she is not sure when she will return to therapy. Pt. reports she ordered the shoulder harness brace, and will schedule a visit for fitting once it comes in. Pt. has an  appointment with her primary care physician next week. Pt. reports she is wondering if an MRI would be appropriate to help determine if there has been an injury to the muscles in the mid upper arm, as she continues to have consistent pain with abduction.  Pt. reports that she had an injury at the onset of the CVA. Pt. reports having had bruising in the region for an extended amount of time. Pt. consistently has pain with abduction. Pt. intermittently has pain at the clavicle with flexion, and pain in the glenohumeral joint.  Pt. Pain has decreased in intensity, and can achieve more ROM. However, pain does persist. Pt. continues to work on improving LUE ROM, and  LUE functioning for improved engagement during ADLs, and IADLs.   Occupational performance deficits (Please refer to evaluation for details): ADL's;IADL's   Rehab Potential Good   OT Frequency 2x / week   OT Duration 12 weeks   OT Treatment/Interventions Self-care/ADL training;DME and/or AE instruction;Energy conservation;Therapeutic exercise;Neuromuscular education;Moist Heat;Therapeutic activities;Cognitive remediation/compensation;Therapeutic exercises;Patient/family education   Consulted and Agree with Plan of Care Patient      Patient will benefit from skilled therapeutic intervention in order to improve the following deficits and impairments:  Decreased cognition, Difficulty walking, Decreased balance, Decreased strength, Decreased safety awareness, Decreased coordination, Impaired UE functional use, Impaired tone, Decreased range of motion, Decreased endurance, Decreased activity tolerance, Impaired perceived functional ability, Pain  Visit  Diagnosis: Muscle weakness (generalized)    Problem List Patient Active Problem List   Diagnosis Date Noted  . Adhesive capsulitis of left shoulder 08/06/2017  . Urinary frequency 07/11/2017  . Gait disturbance, post-stroke 05/31/2017  . Left spastic hemiparesis ([place]) 05/31/2017  . Adjustment disorder with mixed anxiety and depressed mood   . Hypokalemia   . HCAP (healthcare-associated pneumonia)   . Cough with fever   . 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 FUO (fever of unknown origin)   . Oral thrush   . Benign essential HTN   . Rheumatoid arthritis involving both hands ([place]) 04/28/2017  . Right-sided lacunar infarction 04/27/2017  . CVA (cerebral vascular accident) ([place]) 04/24/2017  . LGSIL Pap smear of vagina 12/15/2016  . Anal skin tag 02/02/2016  . Inflammatory arthritis 01/19/2016  . Right knee pain 01/19/2016  . Genital herpes 10/20/2015  . GERD (gastroesophageal reflux disease) 10/20/2015  . Status post vaginal hysterectomy 10/20/2015  . Health care maintenance 05/30/2015  . LLQ pain 03/31/2014  . Menopausal symptoms 03/31/2014  . Hypercholesterolemia 12/31/2012  . Hypothyroidism 12/29/2012  . Osteoarthritis 12/29/2012  . PULMONARY NODULE 01/02/2008  . Mild depression ([place]) 12/30/2007  . Essential hypertension 12/30/2007    Harrel Carina, MS, OTR/L 10/25/2017, 12:25 PM  [place] MAIN [facility] SERVICES [address] [place], [place], [address] Phone: [phone]   Fax:  [phone]  Name: Donna Rhodes MRN: [id number] Date of Birth: Jun 10, 1946

## 2017-10-26 NOTE — Progress Notes (Signed)
Carelink Summary Report / Loop Recorder 

## 2017-10-27 ENCOUNTER — Ambulatory Visit
Admission: RE | Admit: 2017-10-27 | Discharge: 2017-10-27 | Disposition: A | Discharge status: Home / Self Care | Payer: PPO | Source: Ambulatory Visit | Attending: Ophthalmology | Admitting: Ophthalmology

## 2017-10-27 ENCOUNTER — Ambulatory Visit: Payer: PPO | Admitting: Anesthesiology

## 2017-10-27 ENCOUNTER — Encounter
Admission: RE | Disposition: A | Discharge status: Home / Self Care | Payer: Self-pay | Source: Ambulatory Visit | Attending: Ophthalmology

## 2017-10-27 ENCOUNTER — Ambulatory Visit: Payer: PPO | Admitting: Physical Therapy

## 2017-10-27 ENCOUNTER — Ambulatory Visit: Payer: PPO | Admitting: Occupational Therapy

## 2017-10-27 DIAGNOSIS — H2511 Age-related nuclear cataract, right eye: Secondary | ICD-10-CM | POA: Diagnosis not present

## 2017-10-27 DIAGNOSIS — Z79899 Other long term (current) drug therapy: Secondary | ICD-10-CM | POA: Insufficient documentation

## 2017-10-27 DIAGNOSIS — Z87891 Personal history of nicotine dependence: Secondary | ICD-10-CM | POA: Insufficient documentation

## 2017-10-27 DIAGNOSIS — F329 Major depressive disorder, single episode, unspecified: Secondary | ICD-10-CM | POA: Diagnosis not present

## 2017-10-27 DIAGNOSIS — I10 Essential (primary) hypertension: Secondary | ICD-10-CM | POA: Insufficient documentation

## 2017-10-27 DIAGNOSIS — M069 Rheumatoid arthritis, unspecified: Secondary | ICD-10-CM | POA: Diagnosis not present

## 2017-10-27 DIAGNOSIS — E039 Hypothyroidism, unspecified: Secondary | ICD-10-CM | POA: Insufficient documentation

## 2017-10-27 DIAGNOSIS — Z8673 Personal history of transient ischemic attack (TIA), and cerebral infarction without residual deficits: Secondary | ICD-10-CM | POA: Insufficient documentation

## 2017-10-27 DIAGNOSIS — K219 Gastro-esophageal reflux disease without esophagitis: Secondary | ICD-10-CM | POA: Insufficient documentation

## 2017-10-27 HISTORY — PX: CATARACT EXTRACTION W/PHACO: SHX586

## 2017-10-27 HISTORY — DX: Weakness: R53.1

## 2017-10-27 SURGERY — PHACOEMULSIFICATION, CATARACT, WITH IOL INSERTION
Anesthesia: Monitor Anesthesia Care | Laterality: Right | Wound class: Clean

## 2017-10-27 MED ORDER — FENTANYL CITRATE (PF) 100 MCG/2ML IJ SOLN
INTRAMUSCULAR | Status: DC | PRN
  Administered 2017-10-27: 50 ug via INTRAVENOUS

## 2017-10-27 MED ORDER — EPINEPHRINE PF 1 MG/ML IJ SOLN
INTRAMUSCULAR | Status: DC | PRN
  Administered 2017-10-27: 74 mL via OPHTHALMIC

## 2017-10-27 MED ORDER — BALANCED SALT IO SOLN
INTRAOCULAR | Status: DC | PRN
  Administered 2017-10-27: 1 mL via INTRAMUSCULAR

## 2017-10-27 MED ORDER — MIDAZOLAM HCL 2 MG/2ML IJ SOLN
INTRAMUSCULAR | Status: DC | PRN
  Administered 2017-10-27: 1 mg via INTRAVENOUS

## 2017-10-27 MED ORDER — BRIMONIDINE TARTRATE-TIMOLOL 0.2-0.5 % OP SOLN
OPHTHALMIC | Status: DC | PRN
  Administered 2017-10-27: 1 [drp] via OPHTHALMIC

## 2017-10-27 MED ORDER — LACTATED RINGERS IV SOLN: 10.0000 mL/h | INTRAVENOUS | Status: DC

## 2017-10-27 MED ORDER — ARMC OPHTHALMIC DILATING DROPS
1.0000 "application " | OPHTHALMIC | Status: DC | PRN
  Administered 2017-10-27 (×3): 1 via OPHTHALMIC

## 2017-10-27 MED ORDER — MOXIFLOXACIN HCL 0.5 % OP SOLN
1.0000 [drp] | OPHTHALMIC | Status: DC | PRN
  Administered 2017-10-27 (×3): 1 [drp] via OPHTHALMIC

## 2017-10-27 MED ORDER — MOXIFLOXACIN HCL 0.5 % OP SOLN
OPHTHALMIC | Status: DC | PRN
  Administered 2017-10-27: 0.2 mL via OPHTHALMIC

## 2017-10-27 MED ORDER — NA HYALUR & NA CHOND-NA HYALUR 0.4-0.35 ML IO KIT
PACK | INTRAOCULAR | Status: DC | PRN
  Administered 2017-10-27: 1 mL via INTRAOCULAR

## 2017-10-27 SURGICAL SUPPLY — 18 items
CANNULA ANT/CHMB 27GA (MISCELLANEOUS) ×3 IMPLANT
GLOVE SURG LX 7.5 STRW (GLOVE) ×2
GLOVE SURG LX STRL 7.5 STRW (GLOVE) ×1 IMPLANT
GLOVE SURG TRIUMPH 8.0 PF LTX (GLOVE) ×3 IMPLANT
GOWN STRL REUS W/ TWL LRG LVL3 (GOWN DISPOSABLE) ×2 IMPLANT
GOWN STRL REUS W/TWL LRG LVL3 (GOWN DISPOSABLE) ×4
LENS IOL TECNIS ITEC 11.0 (Intraocular Lens) ×3 IMPLANT
MARKER SKIN DUAL TIP RULER LAB (MISCELLANEOUS) ×3 IMPLANT
NEEDLE FILTER BLUNT 18X 1/2SAF (NEEDLE) ×2
NEEDLE FILTER BLUNT 18X1 1/2 (NEEDLE) ×1 IMPLANT
PACK CATARACT BRASINGTON (MISCELLANEOUS) ×3 IMPLANT
PACK EYE AFTER SURG (MISCELLANEOUS) ×3 IMPLANT
PACK OPTHALMIC (MISCELLANEOUS) ×3 IMPLANT
SYR 3ML LL SCALE MARK (SYRINGE) ×3 IMPLANT
SYR 5ML LL (SYRINGE) ×3 IMPLANT
SYR TB 1ML LUER SLIP (SYRINGE) ×3 IMPLANT
WATER STERILE IRR 250ML POUR (IV SOLUTION) ×3 IMPLANT
WIPE NON LINTING 3.25X3.25 (MISCELLANEOUS) ×3 IMPLANT

## 2017-10-27 NOTE — Anesthesia Procedure Notes (Signed)
Procedure Name: MAC Performed by: Alejandria Wessells Pre-anesthesia Checklist: Patient identified, Emergency Drugs available, Suction available, Timeout performed and Patient being monitored Patient Re-evaluated:Patient Re-evaluated prior to inductionOxygen Delivery Method: Nasal cannula Placement Confirmation: positive ETCO2       

## 2017-10-27 NOTE — Anesthesia Preprocedure Evaluation (Signed)
Anesthesia Evaluation  Patient identified by MRN, date of birth, ID band Patient awake    Reviewed: Allergy & Precautions, NPO status   Airway Mallampati: II  TM Distance: >3 FB     Dental   Pulmonary neg pulmonary ROS, former smoker,    breath sounds clear to auscultation       Cardiovascular hypertension,  Rhythm:Regular Rate:Normal     Neuro/Psych Depression CVA (03/2017 - left sided weakness)    GI/Hepatic GERD  Controlled,  Endo/Other  Hypothyroidism   Renal/GU      Musculoskeletal  (+) Arthritis , Rheumatoid disorders,    Abdominal   Peds  Hematology   Anesthesia Other Findings   Reproductive/Obstetrics                            Anesthesia Physical Anesthesia Plan  ASA: III  Anesthesia Plan: MAC   Post-op Pain Management:    Induction:   PONV Risk Score and Plan:   Airway Management Planned: Nasal Cannula  Additional Equipment:   Intra-op Plan:   Post-operative Plan:   Informed Consent: I have reviewed the patients History and Physical, chart, labs and discussed the procedure including the risks, benefits and alternatives for the proposed anesthesia with the patient or authorized representative who has indicated his/her understanding and acceptance.     Plan Discussed with: CRNA  Anesthesia Plan Comments:         Anesthesia Quick Evaluation

## 2017-10-27 NOTE — Anesthesia Postprocedure Evaluation (Signed)
Anesthesia Post Note  Patient: Donna Rhodes  Procedure(s) Performed: CATARACT EXTRACTION PHACO AND INTRAOCULAR LENS PLACEMENT (IOC) RIGHT (Right )  Patient location during evaluation: PACU Anesthesia Type: MAC Level of consciousness: awake Pain management: pain level controlled Vital Signs Assessment: post-procedure vital signs reviewed and stable Respiratory status: respiratory function stable Cardiovascular status: stable Postop Assessment: no signs of nausea or vomiting Anesthetic complications: no    Rune Mendez      

## 2017-10-27 NOTE — Op Note (Addendum)
LOCATION:  [place]   PREOPERATIVE DIAGNOSIS:    Nuclear sclerotic cataract right eye. H25.11   POSTOPERATIVE DIAGNOSIS:  Nuclear sclerotic cataract right eye.     PROCEDURE:  Phacoemusification with posterior chamber intraocular lens placement of the right eye   LENS:   Implant Name Type Inv. Item Serial No. Manufacturer Lot No. LRB No. Used  LENS IOL DIOP 11.0 - [id number] Intraocular Lens LENS IOL DIOP 11.0 [id number]   Right 1        ULTRASOUND TIME: 15 % of 1 minutes, 27 seconds.  CDE 12.8   SURGEON:  Wyonia Hough, MD   ANESTHESIA:  Topical with tetracaine drops and 2% Xylocaine jelly, augmented with 1% preservative-free intracameral lidocaine.    COMPLICATIONS:  None.   DESCRIPTION OF PROCEDURE:  The patient was identified in the holding room and transported to the operating room and placed in the supine position under the operating microscope.  The right eye was identified as the operative eye and it was prepped and draped in the usual sterile ophthalmic fashion.   A 1 millimeter clear-corneal paracentesis was made at the 12:00 position.  0.5 ml of preservative-free 1% lidocaine was injected into the anterior chamber. The anterior chamber was filled with Viscoat viscoelastic.  A 2.4 millimeter keratome was used to make a near-clear corneal incision at the 9:00 position.  A curvilinear capsulorrhexis was made with a cystotome and capsulorrhexis forceps.  Balanced salt solution was used to hydrodissect and hydrodelineate the nucleus.   Phacoemulsification was then used in stop and chop fashion to remove the lens nucleus and epinucleus.  The remaining cortex was then removed using the irrigation and aspiration handpiece. Provisc was then placed into the capsular bag to distend it for lens placement.  A lens was then injected into the capsular bag.  The remaining viscoelastic was aspirated.   Wounds were hydrated with balanced salt solution.  The anterior  chamber was inflated to a physiologic pressure with balanced salt solution.  No wound leaks were noted.Vigamox 0.2 ml of a 1mg  per ml solution was injected into the anterior chamber for a dose of 0.2 mg of intracameral antibiotic at the completion of the case.   Timolol and Brimonidine drops were applied to the eye.  The patient was taken to the recovery room in stable condition without complications of anesthesia or surgery.   Chapman Matteucci 10/27/2017, 10:10 AM

## 2017-10-27 NOTE — H&P (Signed)
The History and Physical notes are on paper, have been signed, and are to be scanned. The patient remains stable and unchanged from the H&P.   Previous H&P reviewed, patient examined, and there are no changes.  Donna Rhodes 10/27/2017 9:41 AM

## 2017-10-27 NOTE — Transfer of Care (Signed)
Immediate Anesthesia Transfer of Care Note  Patient: Donna Rhodes  Procedure(s) Performed: CATARACT EXTRACTION PHACO AND INTRAOCULAR LENS PLACEMENT (IOC) RIGHT (Right )  Patient Location: PACU  Anesthesia Type: MAC  Level of Consciousness: awake, alert  and patient cooperative  Airway and Oxygen Therapy: Patient Spontanous Breathing and Patient connected to supplemental oxygen  Post-op Assessment: Post-op Vital signs reviewed, Patient's Cardiovascular Status Stable, Respiratory Function Stable, Patent Airway and No signs of Nausea or vomiting  Post-op Vital Signs: Reviewed and stable  Complications: No apparent anesthesia complications

## 2017-10-27 NOTE — Anesthesia Postprocedure Evaluation (Signed)
Anesthesia Post Note  Patient: Donna Rhodes  Procedure(s) Performed: CATARACT EXTRACTION PHACO AND INTRAOCULAR LENS PLACEMENT (IOC) RIGHT (Right )  Patient location during evaluation: PACU Anesthesia Type: MAC Level of consciousness: awake Pain management: pain level controlled Vital Signs Assessment: post-procedure vital signs reviewed and stable Respiratory status: respiratory function stable Cardiovascular status: stable Postop Assessment: no signs of nausea or vomiting Anesthetic complications: no    Veda Canning

## 2017-10-28 ENCOUNTER — Encounter: Payer: Self-pay | Admitting: Ophthalmology

## 2017-10-29 LAB — CUP PACEART REMOTE DEVICE CHECK
MDC IDC PG IMPLANT DT: 20180928
MDC IDC SESS DTM: 20181028163604

## 2017-11-02 ENCOUNTER — Ambulatory Visit (INDEPENDENT_AMBULATORY_CARE_PROVIDER_SITE_OTHER): Payer: PPO | Admitting: Internal Medicine

## 2017-11-02 ENCOUNTER — Ambulatory Visit: Payer: PPO | Admitting: Occupational Therapy

## 2017-11-02 ENCOUNTER — Encounter: Payer: Self-pay | Admitting: Internal Medicine

## 2017-11-02 DIAGNOSIS — G8114 Spastic hemiplegia affecting left nondominant side: Secondary | ICD-10-CM | POA: Diagnosis not present

## 2017-11-02 DIAGNOSIS — Z0000 Encounter for general adult medical examination without abnormal findings: Secondary | ICD-10-CM | POA: Diagnosis not present

## 2017-11-02 DIAGNOSIS — M069 Rheumatoid arthritis, unspecified: Secondary | ICD-10-CM | POA: Diagnosis not present

## 2017-11-02 DIAGNOSIS — K219 Gastro-esophageal reflux disease without esophagitis: Secondary | ICD-10-CM

## 2017-11-02 DIAGNOSIS — E7800 Pure hypercholesterolemia, unspecified: Secondary | ICD-10-CM | POA: Diagnosis not present

## 2017-11-02 DIAGNOSIS — F320 Major depressive disorder, single episode, mild: Secondary | ICD-10-CM

## 2017-11-02 DIAGNOSIS — I10 Essential (primary) hypertension: Secondary | ICD-10-CM

## 2017-11-02 DIAGNOSIS — F32A Depression, unspecified: Secondary | ICD-10-CM

## 2017-11-02 DIAGNOSIS — I6381 Other cerebral infarction due to occlusion or stenosis of small artery: Secondary | ICD-10-CM | POA: Diagnosis not present

## 2017-11-02 DIAGNOSIS — M79602 Pain in left arm: Secondary | ICD-10-CM

## 2017-11-02 DIAGNOSIS — R87622 Low grade squamous intraepithelial lesion on cytologic smear of vagina (LGSIL): Secondary | ICD-10-CM | POA: Diagnosis not present

## 2017-11-02 DIAGNOSIS — Z23 Encounter for immunization: Secondary | ICD-10-CM

## 2017-11-02 NOTE — Assessment & Plan Note (Signed)
Physical today 11/02/17.  PAP through Dr Andrew Au office.  Declines mammogram.  Colonoscopy 10/23/13 - recommended f/u colonoscopy in 10 years.

## 2017-11-02 NOTE — Progress Notes (Signed)
Patient ID: Donna Rhodes, female   DOB: 09-27-1946, 71 y.o.   MRN: [id number]   Subjective:    Patient ID: Donna Rhodes, female    DOB: 11-Dec-1946, 71 y.o.   MRN: [id number]  HPI  Patient with past history of hypercholesterolemia, hypertension, hypothyroidism and recent CVA.  She comes in today to follow up on these issues as well as for a complete physical exam.  She is still having pain in her left upper arm.  Going to therapy.  Has had injection.  Request to see ortho for further evaluation.  Some increased stress.  Discussed with her today.  On citalopram.  She does not feel she needs anything more at this time.  No chest pain.  No sob.  Has loop recorder. Continues to f/u with Dr Lovena Le.  No acid reflux.  No abdominal pain.  Bowels moving.  Wants to decrease lipitor.     Past Medical History:  Diagnosis Date  . Atrophic vaginitis   . Dysplasia of vagina    vin 1 - vulva and vaginal cuff  . GERD (gastroesophageal reflux disease)   . HCV infection   . HSV (herpes simplex virus) infection   . Hypercholesterolemia   . Hypertension   . Hypothyroidism   . Left-sided weakness    S/P CVA 4/18.  PT 2x/wk.  . Lung nodule    stable  . Menopausal state   . Osteoarthritis    hand involvement, cervical disc disease  . Osteoporosis   . Pelvic pain in female   . Rheumatoid arthritis ([place])   . Stroke (cerebrum) ([place]) 04/24/2017  . Thyroid nodule   . Vaginal Pap smear, abnormal    lgsil   Past Surgical History:  Procedure Laterality Date  . ADENOIDECTOMY    . APPENDECTOMY  1965  . [place]   left  . CHOLECYSTECTOMY    . [place]  . laser vein surgery    . MANDIBLE RECONSTRUCTION  1992  . TOENAIL EXCISION  1998   several  . [place]  . VAGINAL HYSTERECTOMY  1983   als0- anterior/posterior colporrhaphy  . VAGINAL WOUND CLOSURE / REPAIR  1999, 2000  . VAGINAL WOUND CLOSURE / REPAIR     Family History  Problem Relation Age of Onset  .  Hypertension Father   . Dementia Father   . Osteoarthritis Mother   . Arthritis/Rheumatoid Sister   . Breast cancer Unknown        paternal side  . Breast cancer Paternal Aunt   . Breast cancer Cousin        maternal  . Uterine cancer Maternal Grandmother   . Ovarian cancer Neg Hx   . Heart disease Neg Hx   . Diabetes Neg Hx   . Colon cancer Neg Hx    Social History   Socioeconomic History  . Marital status: Divorced    Spouse name: None  . Number of children: 3  . Years of education: 51  . Highest education level: None  Social Needs  . Financial resource strain: None  . Food insecurity - worry: None  . Food insecurity - inability: None  . Transportation needs - medical: None  . Transportation needs - non-medical: None  Occupational History    Comment: retired  Tobacco Use  . Smoking status: Former Smoker    Last attempt to quit: 12/29/1979    Years since quitting: 37.8  . Smokeless tobacco: Never Used  Substance and Sexual Activity  . Alcohol use: No    Alcohol/week: 0.0 oz  . Drug use: No  . Sexual activity: Not Currently    Birth control/protection: Surgical  Other Topics Concern  . None  Social History Narrative   Lives alone   Caffeine- 1 cup coffee    Outpatient Encounter Medications as of 11/02/2017  Medication Sig  . acetaminophen (TYLENOL) 500 MG tablet Take 500-1,000 mg by mouth every 6 (six) hours as needed for mild pain.  Marland Kitchen acyclovir (ZOVIRAX) 200 MG capsule TAKE ONE CAPSULE BY MOUTH TWICE DAILY  . amLODipine (NORVASC) 2.5 MG tablet Take 2.5 mg by mouth daily.   Marland Kitchen atorvastatin (LIPITOR) 20 MG tablet Take 1 tablet (20 mg total) by mouth daily.  . calcium-vitamin D (OSCAL WITH D) 250-125 MG-UNIT tablet Take 1 tablet by mouth daily.  . citalopram (CELEXA) 10 MG tablet Take 10 mg by mouth daily.  . citalopram (CELEXA) 10 MG tablet TAKE 1 TABLET BY MOUTH ONCE DAILY  . clopidogrel (PLAVIX) 75 MG tablet Take 1 tablet (75 mg total) by mouth daily.  . folic  acid (FOLVITE) 1 MG tablet Take 1 mg by mouth daily.  Marland Kitchen levothyroxine (SYNTHROID, LEVOTHROID) 50 MCG tablet Take 1 tablet (50 mcg total) by mouth daily before breakfast.  . methotrexate 50 MG/2ML injection Inject 0.40mL under the skin once a weekly on Wednesday  . Multiple Vitamin (MULTIVITAMIN WITH MINERALS) TABS tablet Take 1 tablet by mouth daily.  . pantoprazole (PROTONIX) 40 MG tablet Take 40 mg by mouth daily.   No facility-administered encounter medications on file as of 11/02/2017.     Review of Systems  Constitutional: Negative for fever and unexpected weight change.  HENT: Negative for congestion and sinus pressure.   Eyes: Negative for pain and visual disturbance.  Respiratory: Negative for cough, chest tightness and shortness of breath.   Cardiovascular: Negative for chest pain, palpitations and leg swelling.  Gastrointestinal: Negative for abdominal pain, diarrhea, nausea and vomiting.  Genitourinary: Negative for difficulty urinating and dysuria.  Musculoskeletal: Negative for back pain and joint swelling.  Skin: Negative for color change and rash.  Neurological: Negative for dizziness, light-headedness and headaches.  Hematological: Negative for adenopathy. Does not bruise/bleed easily.  Psychiatric/Behavioral: Negative for agitation.       Increased stress as outlined.         Objective:    Physical Exam  Constitutional: She is oriented to person, place, and time. She appears well-developed and well-nourished. No distress.  HENT:  Nose: Nose normal.  Mouth/Throat: Oropharynx is clear and moist.  Eyes: Right eye exhibits no discharge. Left eye exhibits no discharge. No scleral icterus.  Neck: Neck supple. No thyromegaly present.  Cardiovascular: Normal rate and regular rhythm.  Pulmonary/Chest: Breath sounds normal. No accessory muscle usage. No tachypnea. No respiratory distress. She has no decreased breath sounds. She has no wheezes. She has no rhonchi. Right breast  exhibits no inverted nipple, no mass, no nipple discharge and no tenderness (no axillary adenopathy). Left breast exhibits no inverted nipple, no mass, no nipple discharge and no tenderness (no axilarry adenopathy).  Abdominal: Soft. Bowel sounds are normal. There is no tenderness.  Musculoskeletal: She exhibits no edema or tenderness.  Motor strength - decreased left arm.    Lymphadenopathy:    She has no cervical adenopathy.  Neurological: She is alert and oriented to person, place, and time.  Skin: Skin is warm. No rash noted. No erythema.  Psychiatric: She has  a normal mood and affect. Her behavior is normal.    BP 112/70 (BP Location: Right Arm, Patient Position: Sitting, Cuff Size: Normal)   Pulse 69   Temp (!) 97.4 F (36.3 C) (Oral)   Resp 16   Ht 5' 4.17" (1.63 m)   Wt 110 lb 3.2 oz (50 kg)   LMP 12/29/1981   SpO2 95%   BMI 18.81 kg/m  Wt Readings from Last 3 Encounters:  11/02/17 110 lb 3.2 oz (50 kg)  10/27/17 109 lb (49.4 kg)  09/24/17 110 lb (49.9 kg)     Lab Results  Component Value Date   WBC 6.9 08/16/2017   HGB 12.4 08/16/2017   HCT 37.9 08/16/2017   PLT 252.0 08/16/2017   GLUCOSE 93 08/16/2017   CHOL 119 08/16/2017   TRIG 84.0 08/16/2017   HDL 55.00 08/16/2017   LDLDIRECT 128.0 05/25/2013   LDLCALC 47 08/16/2017   ALT 28 08/16/2017   AST 23 08/16/2017   NA 141 08/16/2017   K 4.2 08/16/2017   CL 105 08/16/2017   CREATININE 0.74 08/16/2017   BUN 16 08/16/2017   CO2 29 08/16/2017   TSH 2.38 07/09/2017   HGBA1C 5.7 (H) 04/25/2017       Assessment & Plan:   Problem List Items Addressed This Visit    Essential hypertension    Blood pressure under good control.  Continue same medication regimen.  Follow pressures.  Follow metabolic panel.        Relevant Orders   Basic metabolic panel   GERD (gastroesophageal reflux disease)    Stable on protonix.        Health care maintenance    Physical today 11/02/17.  PAP through Dr Andrew Au  office.  Declines mammogram.  Colonoscopy 10/23/13 - recommended f/u colonoscopy in 10 years.        Hypercholesterolemia    On lipitor.  Wants to decrease her dose.  Would like for her to stay on 20mg  q day.  Follow lipid panel and liver function tests.        Relevant Orders   Hepatic function panel   Lipid panel   Left arm pain    Left upper arm pain as outlined.  Continue therapy.  Is s/p injection.  Wants referral to ortho.        Relevant Orders   Ambulatory referral to Orthopedic Surgery   Left spastic hemiparesis ([place])    Recent CVA.  Seeing neurology.  Continue therapy.        LGSIL Pap smear of vagina    Followed by gyn.        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  Stable.  Does not feel needs any further intervention.       Rheumatoid arthritis involving both hands ([place])    Followed by Dr Jefm Bryant.  On MTX.        Right-sided lacunar infarction    Resulting left arm and leg weakness.  Going to therapy.  Continue f/u with neurology.  Has loop recorder.  On aspirin and plavix.         Other Visit Diagnoses    Encounter for immunization       Relevant Orders   Flu vaccine HIGH DOSE PF (Completed)       Einar Pheasant, MD

## 2017-11-04 DIAGNOSIS — H2512 Age-related nuclear cataract, left eye: Secondary | ICD-10-CM | POA: Diagnosis not present

## 2017-11-05 ENCOUNTER — Ambulatory Visit: Payer: PPO | Attending: Physical Medicine & Rehabilitation | Admitting: Occupational Therapy

## 2017-11-05 ENCOUNTER — Encounter: Payer: Self-pay | Admitting: Internal Medicine

## 2017-11-05 ENCOUNTER — Encounter: Payer: Self-pay | Admitting: Occupational Therapy

## 2017-11-05 DIAGNOSIS — M6281 Muscle weakness (generalized): Secondary | ICD-10-CM | POA: Diagnosis not present

## 2017-11-05 DIAGNOSIS — R2689 Other abnormalities of gait and mobility: Secondary | ICD-10-CM | POA: Insufficient documentation

## 2017-11-05 DIAGNOSIS — M79602 Pain in left arm: Secondary | ICD-10-CM | POA: Insufficient documentation

## 2017-11-05 DIAGNOSIS — R278 Other lack of coordination: Secondary | ICD-10-CM | POA: Insufficient documentation

## 2017-11-05 NOTE — Assessment & Plan Note (Addendum)
On lipitor.  Wants to decrease her dose.  Would like for her to stay on 20mg  q day.  Follow lipid panel and liver function tests.

## 2017-11-05 NOTE — Assessment & Plan Note (Signed)
Followed by Dr Kernodle.  On MTX.   

## 2017-11-05 NOTE — Assessment & Plan Note (Signed)
Followed by gyn

## 2017-11-05 NOTE — Assessment & Plan Note (Signed)
Stable on protonix

## 2017-11-05 NOTE — Assessment & Plan Note (Signed)
Blood pressure under good control.  Continue same medication regimen.  Follow pressures.  Follow metabolic panel.   

## 2017-11-05 NOTE — Therapy (Signed)
[place] MAIN [facility] SERVICES [address] [place], [place], [address] Phone: [phone]   Fax:  [phone]  Occupational Therapy Treatment  Patient Details  Name: Donna Rhodes MRN: [id number] Date of Birth: [birth year] Referring Provider: Dr. Letta Pate   Encounter Date: 11/05/2017  OT End of Session - 11/05/17 1210    Visit Number  24    Number of Visits  48    Date for OT Re-Evaluation  01/03/18    Authorization Type  Medicare G Code 4 of 10    OT Start Time  1123    OT Stop Time  1200    OT Time Calculation (min)  37 min    Activity Tolerance  Patient tolerated treatment well    Behavior During Therapy  [facility] for tasks assessed/performed       Past Medical History:  Diagnosis Date  . Atrophic vaginitis   . Dysplasia of vagina    vin 1 - vulva and vaginal cuff  . GERD (gastroesophageal reflux disease)   . HCV infection   . HSV (herpes simplex virus) infection   . Hypercholesterolemia   . Hypertension   . Hypothyroidism   . Left-sided weakness    S/P CVA 4/18.  PT 2x/wk.  . Lung nodule    stable  . Menopausal state   . Osteoarthritis    hand involvement, cervical disc disease  . Osteoporosis   . Pelvic pain in female   . Rheumatoid arthritis ([place])   . Stroke (cerebrum) ([place]) 04/24/2017  . Thyroid nodule   . Vaginal Pap smear, abnormal    lgsil    Past Surgical History:  Procedure Laterality Date  . ADENOIDECTOMY    . APPENDECTOMY  1965  . [place]   left  . CHOLECYSTECTOMY    . [place]  . laser vein surgery    . MANDIBLE RECONSTRUCTION  1992  . TOENAIL EXCISION  1998   several  . [place]  . VAGINAL HYSTERECTOMY  1983   als0- anterior/posterior colporrhaphy  . VAGINAL WOUND CLOSURE / REPAIR  1999, 2000  . VAGINAL WOUND CLOSURE / REPAIR      There were no vitals filed for this visit.  Subjective Assessment - 11/05/17 1207    Subjective   Pt. had cataract surgery  last week. Pt. plans to have the other eye done next week.    Patient is accompained by:  Family member    Pertinent History  Pt. is a 71 y.o. female who had a CVA on 04/24/2017. Pt. went to the ER when she started not feeling well, and having heart palpitations. Pt. reports having work up initially, however was sent home. Pt. reports later in the day she was unable to write with her left hand. Pt. returned to the ER, and was admitted with workup for a CVA. Pt. was admitted to [facility], and stayed for approximately 3 weeks. Pt. returned home alone, and  received home health services for the past 2 months. Pt. now presents for Outpatient OT services. Pt. resides home alone, and has support children who stop by daily, assist with shopping, transportation, and  ADL/IADL tasks as needed.     Currently in Pain?  Yes    Pain Score  4     Pain Location  Arm    Pain Orientation  Left      OT TREATMENT    Selfcare:  Pt.,  and daughter education was provided about proper shoulder harness brace application, and wearing schedule, and brace care. Pt. requires assist to donn, and doff the brace. Pt. reports that she does not like the rubber axilla piece. Pt./family education was provided about removing the brace if having any discomfort.  Manual Therapy:  Pt. performed scapular mobilizations for elevation, depression, abduction, and rotation in preparation for ROM and functional hand use.                        OT Education - 11/05/17 1209    Education provided  Yes    Education Details  LUE functioning.    Person(s) Educated  Patient    Methods  Explanation;Demonstration;Verbal cues    Comprehension  Verbalized understanding;Verbal cues required;Returned demonstration          OT Long Term Goals - 10/11/17 1219      OT LONG TERM GOAL #1   Title  Pt. will tolerate increased shoulder PROM by 20 degrees with pain improved to under 3/10    Baseline  Flexion:  91, Abduction: 67 10/11/2017: flexion: 108, Abduction: 77    Time  12    Period  Weeks    Status  On-going    Target Date  01/03/18      OT LONG TERM GOAL #2   Title  Pt. will formulate a full fist to be able to stabilize objects with her Left hand while using her right hand.     Baseline  Digit flexion to [facility]: 2nd digit: 4cm, 3rd: 5.5, 4th: 6, 5th: 4.    Time  12    Period  Weeks    Status  On-going    Target Date  01/03/18      OT LONG TERM GOAL #3   Title  Pt. will be able to wipe face independently with her Left UE.    Baseline  Pt. is unable 10/11/2017: hand-over hand assist required    Period  Weeks    Status  On-going    Target Date  01/03/18      OT LONG TERM GOAL #4   Title  Pt. will demonstrate independence with splint application.    Baseline  Pt. is unable to donn splint 10/11/2017: assist required    Time  12    Period  Weeks    Status  On-going    Target Date  01/03/18      OT LONG TERM GOAL #5   Title  Pt. will initiate active grasp release with thumb to 2nd digit 100% of the time to be able to hold a utensil in preparation for self-feeding.    Baseline  Minimal thumb movement. 10/11/2018: Pt. conitnues to have limited thumb motion.    Time  12    Period  Weeks    Status  On-going    Target Date  01/03/18      OT LONG TERM GOAL #6   Title  Pt. will initiate wrist extension in preparation for functional reaching.    Baseline  No active wrist extension. 10/11/2018: inconsistently initiating wrist extension.    Time  12    Period  Weeks    Status  On-going    Target Date  01/03/18            Plan - 11/05/17 1210    Clinical Impression Statement  Pt. had cataract surgery this past week, and will have the other one done  next week. Pt.'s shoulder harness brace has arrived. The FIt was assessed, and modified. Pt., and family education was provided about splint application, and removal. Pt. will require assist for donning, and doffing brace properly. Pt.  reports not liking the rubber strap cover for the right axilla. Pt., and daughter education was provided about the fit, adjustments, and anticipated wearing schedule. Pt. education was provided about removing the brace if any discomfort, or irritation occurs    Occupational performance deficits (Please refer to evaluation for details):  ADL's;IADL's    Rehab Potential  Good    OT Frequency  2x / week    OT Duration  12 weeks    OT Treatment/Interventions  Self-care/ADL training;DME and/or AE instruction;Energy conservation;Therapeutic exercise;Neuromuscular education;Moist Heat;Therapeutic activities;Cognitive remediation/compensation;Therapeutic exercises;Patient/family education    Consulted and Agree with Plan of Care  Patient       Patient will benefit from skilled therapeutic intervention in order to improve the following deficits and impairments:  Decreased cognition, Difficulty walking, Decreased balance, Decreased strength, Decreased safety awareness, Decreased coordination, Impaired UE functional use, Impaired tone, Decreased range of motion, Decreased endurance, Decreased activity tolerance, Impaired perceived functional ability, Pain  Visit Diagnosis: Muscle weakness (generalized)    Problem List Patient Active Problem List   Diagnosis Date Noted  . Left arm pain 11/05/2017  . Adhesive capsulitis of left shoulder 08/06/2017  . Urinary frequency 07/11/2017  . Gait disturbance, post-stroke 05/31/2017  . Left spastic hemiparesis ([place]) 05/31/2017  . Adjustment disorder with mixed anxiety and depressed mood   . Hypokalemia   . HCAP (healthcare-associated pneumonia)   . Cough with fever   . 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 FUO (fever of unknown origin)   . Oral thrush   . Benign essential HTN   . Rheumatoid arthritis involving both hands ([place]) 04/28/2017  . Right-sided lacunar infarction 04/27/2017  . CVA (cerebral vascular accident) ([place]) 04/24/2017  . LGSIL  Pap smear of vagina 12/15/2016  . Anal skin tag 02/02/2016  . Inflammatory arthritis 01/19/2016  . Right knee pain 01/19/2016  . Genital herpes 10/20/2015  . GERD (gastroesophageal reflux disease) 10/20/2015  . Status post vaginal hysterectomy 10/20/2015  . Health care maintenance 05/30/2015  . LLQ pain 03/31/2014  . Menopausal symptoms 03/31/2014  . Hypercholesterolemia 12/31/2012  . Hypothyroidism 12/29/2012  . Osteoarthritis 12/29/2012  . PULMONARY NODULE 01/02/2008  . Mild depression ([place]) 12/30/2007  . Essential hypertension 12/30/2007    Harrel Carina, MS, OTR/L 11/05/2017, 12:26 PM  [place] MAIN [facility] SERVICES [address] [place], [place], [address] Phone: [phone]   Fax:  [phone]  Name: Donna Rhodes MRN: [id number] Date of Birth: Oct 04, 1946

## 2017-11-05 NOTE — Assessment & Plan Note (Signed)
Resulting left arm and leg weakness.  Going to therapy.  Continue f/u with neurology.  Has loop recorder.  On aspirin and plavix.

## 2017-11-05 NOTE — Assessment & Plan Note (Signed)
Recent CVA.  Seeing neurology.  Continue therapy.

## 2017-11-05 NOTE — Assessment & Plan Note (Signed)
On citalopram.  Stable.  Does not feel needs any further intervention.

## 2017-11-05 NOTE — Assessment & Plan Note (Signed)
Left upper arm pain as outlined.  Continue therapy.  Is s/p injection.  Wants referral to ortho.

## 2017-11-08 ENCOUNTER — Telehealth: Payer: Self-pay | Admitting: Internal Medicine

## 2017-11-08 NOTE — Telephone Encounter (Signed)
Please advise 

## 2017-11-08 NOTE — Telephone Encounter (Signed)
Copied from [place]. Topic: Referral - Status >> Nov 08, 2017  3:15 PM Ebony Hail wrote: Reason for CRM: pt called to check the status of Referral #: [id number]/// please follow up with call back to pt.

## 2017-11-09 ENCOUNTER — Ambulatory Visit: Payer: PPO | Admitting: Occupational Therapy

## 2017-11-09 ENCOUNTER — Encounter: Payer: Self-pay | Admitting: Occupational Therapy

## 2017-11-09 DIAGNOSIS — R278 Other lack of coordination: Secondary | ICD-10-CM

## 2017-11-09 DIAGNOSIS — M6281 Muscle weakness (generalized): Secondary | ICD-10-CM | POA: Diagnosis not present

## 2017-11-09 NOTE — Telephone Encounter (Signed)
Referral was placed on 11/9. Referral has been sent to [facility] for dr. Donney Rankins

## 2017-11-09 NOTE — Therapy (Signed)
[place] MAIN [facility] SERVICES [address] [place], [place], [address] Phone: [phone]   Fax:  [phone]  Occupational Therapy Treatment  Patient Details  Name: Donna Rhodes MRN: [id number] Date of Birth: [birth year] Referring Provider: Dr. Letta Pate   Encounter Date: 11/09/2017  OT End of Session - 11/09/17 1328    Visit Number  24    Number of Visits  48    Date for OT Re-Evaluation  01/03/18    Authorization Type  Medicare G Code 5 of 10    OT Start Time  1120    OT Stop Time  1158    OT Time Calculation (min)  38 min    Activity Tolerance  Patient tolerated treatment well    Behavior During Therapy  [facility] for tasks assessed/performed       Past Medical History:  Diagnosis Date  . Atrophic vaginitis   . Dysplasia of vagina    vin 1 - vulva and vaginal cuff  . GERD (gastroesophageal reflux disease)   . HCV infection   . HSV (herpes simplex virus) infection   . Hypercholesterolemia   . Hypertension   . Hypothyroidism   . Left-sided weakness    S/P CVA 4/18.  PT 2x/wk.  . Lung nodule    stable  . Menopausal state   . Osteoarthritis    hand involvement, cervical disc disease  . Osteoporosis   . Pelvic pain in female   . Rheumatoid arthritis ([place])   . Stroke (cerebrum) ([place]) 04/24/2017  . Thyroid nodule   . Vaginal Pap smear, abnormal    lgsil    Past Surgical History:  Procedure Laterality Date  . ADENOIDECTOMY    . APPENDECTOMY  1965  . [place]   left  . CHOLECYSTECTOMY    . [place]  . laser vein surgery    . MANDIBLE RECONSTRUCTION  1992  . TOENAIL EXCISION  1998   several  . [place]  . VAGINAL HYSTERECTOMY  1983   als0- anterior/posterior colporrhaphy  . VAGINAL WOUND CLOSURE / REPAIR  1999, 2000  . VAGINAL WOUND CLOSURE / REPAIR      There were no vitals filed for this visit.  Subjective Assessment - 11/09/17 1326    Subjective   Pt. reports she plans to  have cataract surgery tomorrow on the opposite eye.    Patient is accompained by:  Family member    Pertinent History  Pt. is a 71 y.o. female who had a CVA on 04/24/2017. Pt. went to the ER when she started not feeling well, and having heart palpitations. Pt. reports having work up initially, however was sent home. Pt. reports later in the day she was unable to write with her left hand. Pt. returned to the ER, and was admitted with workup for a CVA. Pt. was admitted to [facility], and stayed for approximately 3 weeks. Pt. returned home alone, and  received home health services for the past 2 months. Pt. now presents for Outpatient OT services. Pt. resides home alone, and has support children who stop by daily, assist with shopping, transportation, and  ADL/IADL tasks as needed.     Patient Stated Goals  To get back the way she was. (Independent)    Currently in Pain?  Yes    Pain Score  8     Pain Location  Shoulder    Pain Orientation  Left  Pain Descriptors / Indicators  Aching;Sore    Pain Type  Chronic pain         OT TREATMENT    Therapeutic Exercise:   Pt. presented with decreased ROM today, increased tone/tightness, and increased pain (8/10) at the end ranges. Pt. tolerated shoulder PROM for flexion, extension, horizontal abduction, external rotation, elbow flexion, extension, forearm supination, wrist extension, and digit extension. Pt. worked on [employer] shoulder flexion, abduction, elbow extension for hand to face patterns. AROM for forearm supination, and wrist extension. Pt. education was provided bout ROM, proper ROM techniques, performing ROM slowly, and position of UE during ROM.   Manual Therapy:  Pt. Tolerated scapular mobilizations in elevation, depression, abduction, and rotation to normalize tone, and prepare the LUE for ROM.                         OT Education - 11/09/17 1328    Education provided  Yes    Education  Details  LUE ROM< and functioning.    Person(s) Educated  Patient    Methods  Explanation;Demonstration;Verbal cues    Comprehension  Verbalized understanding;Verbal cues required;Returned demonstration          OT Long Term Goals - 10/11/17 1219      OT LONG TERM GOAL #1   Title  Pt. will tolerate increased shoulder PROM by 20 degrees with pain improved to under 3/10    Baseline  Flexion: 91, Abduction: 67 10/11/2017: flexion: 108, Abduction: 77    Time  12    Period  Weeks    Status  On-going    Target Date  01/03/18      OT LONG TERM GOAL #2   Title  Pt. will formulate a full fist to be able to stabilize objects with her Left hand while using her right hand.     Baseline  Digit flexion to [facility]: 2nd digit: 4cm, 3rd: 5.5, 4th: 6, 5th: 4.    Time  12    Period  Weeks    Status  On-going    Target Date  01/03/18      OT LONG TERM GOAL #3   Title  Pt. will be able to wipe face independently with her Left UE.    Baseline  Pt. is unable 10/11/2017: hand-over hand assist required    Period  Weeks    Status  On-going    Target Date  01/03/18      OT LONG TERM GOAL #4   Title  Pt. will demonstrate independence with splint application.    Baseline  Pt. is unable to donn splint 10/11/2017: assist required    Time  12    Period  Weeks    Status  On-going    Target Date  01/03/18      OT LONG TERM GOAL #5   Title  Pt. will initiate active grasp release with thumb to 2nd digit 100% of the time to be able to hold a utensil in preparation for self-feeding.    Baseline  Minimal thumb movement. 10/11/2018: Pt. conitnues to have limited thumb motion.    Time  12    Period  Weeks    Status  On-going    Target Date  01/03/18      OT LONG TERM GOAL #6   Title  Pt. will initiate wrist extension in preparation for functional reaching.    Baseline  No active wrist extension. 10/11/2018: inconsistently initiating  wrist extension.    Time  12    Period  Weeks    Status  On-going     Target Date  01/03/18            Plan - 11/09/17 1330    Clinical Impression Statement  Pt. reports she plans to return the shoulder harness brace. Pt. was fit for it last session, however, pt. reports it was not working for her. Pt. reports she has not been doing ROM at home since before her eye surgery. Pt. presents with increase tightness/tone. Pt. continues to have pain throughout the LUE shoulder, clavicle, deltoid,, and glenhumeral joint. Pt. continues to require verbal cues, encouragement, as well as cues for redirection.    Occupational performance deficits (Please refer to evaluation for details):  ADL's;IADL's    Rehab Potential  Good    OT Frequency  2x / week    OT Duration  12 weeks    OT Treatment/Interventions  Self-care/ADL training;DME and/or AE instruction;Energy conservation;Therapeutic exercise;Neuromuscular education;Moist Heat;Therapeutic activities;Cognitive remediation/compensation;Therapeutic exercises;Patient/family education    Consulted and Agree with Plan of Care  Patient       Patient will benefit from skilled therapeutic intervention in order to improve the following deficits and impairments:  Decreased cognition, Difficulty walking, Decreased balance, Decreased strength, Decreased safety awareness, Decreased coordination, Impaired UE functional use, Impaired tone, Decreased range of motion, Decreased endurance, Decreased activity tolerance, Impaired perceived functional ability, Pain  Visit Diagnosis: Muscle weakness (generalized)  Other lack of coordination    Problem List Patient Active Problem List   Diagnosis Date Noted  . Left arm pain 11/05/2017  . Adhesive capsulitis of left shoulder 08/06/2017  . Urinary frequency 07/11/2017  . Gait disturbance, post-stroke 05/31/2017  . Left spastic hemiparesis ([place]) 05/31/2017  . Adjustment disorder with mixed anxiety and depressed mood   . Hypokalemia   . HCAP (healthcare-associated pneumonia)   .  Cough with fever   . 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 FUO (fever of unknown origin)   . Oral thrush   . Benign essential HTN   . Rheumatoid arthritis involving both hands ([place]) 04/28/2017  . Right-sided lacunar infarction 04/27/2017  . CVA (cerebral vascular accident) ([place]) 04/24/2017  . LGSIL Pap smear of vagina 12/15/2016  . Anal skin tag 02/02/2016  . Inflammatory arthritis 01/19/2016  . Right knee pain 01/19/2016  . Genital herpes 10/20/2015  . GERD (gastroesophageal reflux disease) 10/20/2015  . Status post vaginal hysterectomy 10/20/2015  . Health care maintenance 05/30/2015  . LLQ pain 03/31/2014  . Menopausal symptoms 03/31/2014  . Hypercholesterolemia 12/31/2012  . Hypothyroidism 12/29/2012  . Osteoarthritis 12/29/2012  . PULMONARY NODULE 01/02/2008  . Mild depression ([place]) 12/30/2007  . Essential hypertension 12/30/2007    Harrel Carina, MS, OTR/L 11/09/2017, 2:05 PM  [place] MAIN [facility] SERVICES [address] [place], [place], [address] Phone: [phone]   Fax:  [phone]  Name: Donna Rhodes MRN: [id number] Date of Birth: 09-05-1946

## 2017-11-09 NOTE — Discharge Instructions (Signed)

## 2017-11-10 ENCOUNTER — Ambulatory Visit
Admission: RE | Admit: 2017-11-10 | Discharge: 2017-11-10 | Disposition: A | Discharge status: Home / Self Care | Payer: PPO | Source: Ambulatory Visit | Attending: Ophthalmology | Admitting: Ophthalmology

## 2017-11-10 ENCOUNTER — Ambulatory Visit: Payer: PPO | Admitting: Student in an Organized Health Care Education/Training Program

## 2017-11-10 ENCOUNTER — Encounter
Admission: RE | Disposition: A | Discharge status: Home / Self Care | Payer: Self-pay | Source: Ambulatory Visit | Attending: Ophthalmology

## 2017-11-10 ENCOUNTER — Telehealth: Payer: Self-pay

## 2017-11-10 DIAGNOSIS — M069 Rheumatoid arthritis, unspecified: Secondary | ICD-10-CM | POA: Insufficient documentation

## 2017-11-10 DIAGNOSIS — H2512 Age-related nuclear cataract, left eye: Secondary | ICD-10-CM | POA: Diagnosis not present

## 2017-11-10 DIAGNOSIS — E7800 Pure hypercholesterolemia, unspecified: Secondary | ICD-10-CM | POA: Insufficient documentation

## 2017-11-10 DIAGNOSIS — Z87891 Personal history of nicotine dependence: Secondary | ICD-10-CM | POA: Insufficient documentation

## 2017-11-10 DIAGNOSIS — Z79899 Other long term (current) drug therapy: Secondary | ICD-10-CM | POA: Diagnosis not present

## 2017-11-10 DIAGNOSIS — I10 Essential (primary) hypertension: Secondary | ICD-10-CM | POA: Insufficient documentation

## 2017-11-10 DIAGNOSIS — E039 Hypothyroidism, unspecified: Secondary | ICD-10-CM | POA: Diagnosis not present

## 2017-11-10 DIAGNOSIS — I69354 Hemiplegia and hemiparesis following cerebral infarction affecting left non-dominant side: Secondary | ICD-10-CM | POA: Diagnosis not present

## 2017-11-10 DIAGNOSIS — Z7902 Long term (current) use of antithrombotics/antiplatelets: Secondary | ICD-10-CM | POA: Insufficient documentation

## 2017-11-10 DIAGNOSIS — F329 Major depressive disorder, single episode, unspecified: Secondary | ICD-10-CM | POA: Diagnosis not present

## 2017-11-10 HISTORY — PX: CATARACT EXTRACTION W/PHACO: SHX586

## 2017-11-10 SURGERY — PHACOEMULSIFICATION, CATARACT, WITH IOL INSERTION
Anesthesia: Monitor Anesthesia Care | Laterality: Left | Wound class: Clean

## 2017-11-10 MED ORDER — ACETAMINOPHEN 160 MG/5ML PO SOLN: 325.0000 mg | ORAL | Status: DC | PRN

## 2017-11-10 MED ORDER — FENTANYL CITRATE (PF) 100 MCG/2ML IJ SOLN
INTRAMUSCULAR | Status: DC | PRN
  Administered 2017-11-10: 50 ug via INTRAVENOUS

## 2017-11-10 MED ORDER — ARMC OPHTHALMIC DILATING DROPS
1.0000 "application " | OPHTHALMIC | Status: DC | PRN
  Administered 2017-11-10 (×3): 1 via OPHTHALMIC

## 2017-11-10 MED ORDER — NA HYALUR & NA CHOND-NA HYALUR 0.4-0.35 ML IO KIT
PACK | INTRAOCULAR | Status: DC | PRN
  Administered 2017-11-10: 1 mL via INTRAOCULAR

## 2017-11-10 MED ORDER — MOXIFLOXACIN HCL 0.5 % OP SOLN
1.0000 [drp] | OPHTHALMIC | Status: DC | PRN
  Administered 2017-11-10 (×3): 1 [drp] via OPHTHALMIC

## 2017-11-10 MED ORDER — LIDOCAINE HCL (PF) 2 % IJ SOLN
INTRAMUSCULAR | Status: DC | PRN
  Administered 2017-11-10: 1 mL via INTRAMUSCULAR

## 2017-11-10 MED ORDER — MOXIFLOXACIN HCL 0.5 % OP SOLN
OPHTHALMIC | Status: DC | PRN
  Administered 2017-11-10: 0.2 mL via OPHTHALMIC

## 2017-11-10 MED ORDER — EPINEPHRINE PF 1 MG/ML IJ SOLN
INTRAOCULAR | Status: DC | PRN
  Administered 2017-11-10: 74 mL via OPHTHALMIC

## 2017-11-10 MED ORDER — MIDAZOLAM HCL 2 MG/2ML IJ SOLN
INTRAMUSCULAR | Status: DC | PRN
  Administered 2017-11-10: 2 mg via INTRAVENOUS

## 2017-11-10 MED ORDER — ACETAMINOPHEN 325 MG PO TABS: 325.0000 mg | ORAL_TABLET | ORAL | Status: DC | PRN

## 2017-11-10 MED ORDER — ONDANSETRON HCL 4 MG/2ML IJ SOLN: 4.0000 mg | Freq: Once | INTRAMUSCULAR | Status: DC | PRN

## 2017-11-10 MED ORDER — BRIMONIDINE TARTRATE-TIMOLOL 0.2-0.5 % OP SOLN
OPHTHALMIC | Status: DC | PRN
  Administered 2017-11-10: 1 [drp] via OPHTHALMIC

## 2017-11-10 SURGICAL SUPPLY — 25 items
CANNULA ANT/CHMB 27GA (MISCELLANEOUS) ×3 IMPLANT
CARTRIDGE ABBOTT (MISCELLANEOUS) IMPLANT
GLOVE SURG LX 7.5 STRW (GLOVE) ×2
GLOVE SURG LX STRL 7.5 STRW (GLOVE) ×1 IMPLANT
GLOVE SURG TRIUMPH 8.0 PF LTX (GLOVE) ×3 IMPLANT
GOWN STRL REUS W/ TWL LRG LVL3 (GOWN DISPOSABLE) ×2 IMPLANT
GOWN STRL REUS W/TWL LRG LVL3 (GOWN DISPOSABLE) ×4
LENS IOL TECNIS ITEC 12.5 (Intraocular Lens) ×3 IMPLANT
MARKER SKIN DUAL TIP RULER LAB (MISCELLANEOUS) ×3 IMPLANT
NDL RETROBULBAR .5 NSTRL (NEEDLE) IMPLANT
NEEDLE FILTER BLUNT 18X 1/2SAF (NEEDLE) ×2
NEEDLE FILTER BLUNT 18X1 1/2 (NEEDLE) ×1 IMPLANT
PACK CATARACT BRASINGTON (MISCELLANEOUS) ×3 IMPLANT
PACK EYE AFTER SURG (MISCELLANEOUS) ×3 IMPLANT
PACK OPTHALMIC (MISCELLANEOUS) ×3 IMPLANT
RING MALYGIN 7.0 (MISCELLANEOUS) IMPLANT
SUT ETHILON 10-0 CS-B-6CS-B-6 (SUTURE)
SUT VICRYL  9 0 (SUTURE)
SUT VICRYL 9 0 (SUTURE) IMPLANT
SUTURE EHLN 10-0 CS-B-6CS-B-6 (SUTURE) IMPLANT
SYR 3ML LL SCALE MARK (SYRINGE) ×3 IMPLANT
SYR 5ML LL (SYRINGE) ×3 IMPLANT
SYR TB 1ML LUER SLIP (SYRINGE) ×3 IMPLANT
WATER STERILE IRR 250ML POUR (IV SOLUTION) ×3 IMPLANT
WIPE NON LINTING 3.25X3.25 (MISCELLANEOUS) ×3 IMPLANT

## 2017-11-10 NOTE — Anesthesia Postprocedure Evaluation (Signed)
Anesthesia Post Note  Patient: Donna Rhodes  Procedure(s) Performed: CATARACT EXTRACTION PHACO AND INTRAOCULAR LENS PLACEMENT (IOC) LEFT (Left )  Patient location during evaluation: PACU Anesthesia Type: MAC Level of consciousness: awake and alert Pain management: pain level controlled Vital Signs Assessment: post-procedure vital signs reviewed and stable Respiratory status: spontaneous breathing Cardiovascular status: blood pressure returned to baseline Postop Assessment: no headache Anesthetic complications: no    Jaci Standard, III,  Danicka Hourihan D

## 2017-11-10 NOTE — Telephone Encounter (Signed)
Barnet Pall called stating that she needs FMLA papers filled out for intermediate leave for her mothers care. She wants to know if Dr. Letta Pate can do this or if she needs to get this completed by her primary physician?

## 2017-11-10 NOTE — H&P (Signed)
The History and Physical notes are on paper, have been signed, and are to be scanned. The patient remains stable and unchanged from the H&P.   Previous H&P reviewed, patient examined, and there are no changes.  Omri Bertran 11/10/2017 11:35 AM

## 2017-11-10 NOTE — Telephone Encounter (Signed)
I spoke with Oleh Genin and gave her the fax number so she can fax the FMLA papers to get them filled out.

## 2017-11-10 NOTE — Anesthesia Procedure Notes (Signed)
Procedure Name: MAC Performed by: Londell Moh, CRNA Pre-anesthesia Checklist: Patient identified, Emergency Drugs available, Suction available, Timeout performed and Patient being monitored Patient Re-evaluated:Patient Re-evaluated prior to induction Oxygen Delivery Method: Nasal cannula Placement Confirmation: positive ETCO2

## 2017-11-10 NOTE — Telephone Encounter (Signed)
We can do this.

## 2017-11-10 NOTE — Op Note (Signed)
OPERATIVE NOTE  APHRODITE HARPENAU [id number] 11/10/2017   PREOPERATIVE DIAGNOSIS:  Nuclear sclerotic cataract left eye. H25.12   POSTOPERATIVE DIAGNOSIS:    Nuclear sclerotic cataract left eye.     PROCEDURE:  Phacoemusification with posterior chamber intraocular lens placement of the left eye   LENS:   Implant Name Type Inv. Item Serial No. Manufacturer Lot No. LRB No. Used  LENS IOL DIOP 12.5 - [id number] Intraocular Lens LENS IOL DIOP 12.5 [id number]  Left 1        ULTRASOUND TIME: 12  % of 1 minutes 14 seconds, CDE 9.0  SURGEON:  Wyonia Hough, MD   ANESTHESIA:  Topical with tetracaine drops and 2% Xylocaine jelly, augmented with 1% preservative-free intracameral lidocaine.    COMPLICATIONS:  None.   DESCRIPTION OF PROCEDURE:  The patient was identified in the holding room and transported to the operating room and placed in the supine position under the operating microscope.  The left eye was identified as the operative eye and it was prepped and draped in the usual sterile ophthalmic fashion.   A 1 millimeter clear-corneal paracentesis was made at the 1:30 position.  0.5 ml of preservative-free 1% lidocaine was injected into the anterior chamber.  The anterior chamber was filled with Viscoat viscoelastic.  A 2.4 millimeter keratome was used to make a near-clear corneal incision at the 10:30 position.  .  A curvilinear capsulorrhexis was made with a cystotome and capsulorrhexis forceps.  Balanced salt solution was used to hydrodissect and hydrodelineate the nucleus.   Phacoemulsification was then used in stop and chop fashion to remove the lens nucleus and epinucleus.  The remaining cortex was then removed using the irrigation and aspiration handpiece. Provisc was then placed into the capsular bag to distend it for lens placement.  A lens was then injected into the capsular bag.  The remaining viscoelastic was aspirated.   Wounds were hydrated with balanced salt  solution.  The anterior chamber was inflated to a physiologic pressure with balanced salt solution.  No wound leaks were noted. Vigamox 0.2 ml of a 1mg  per ml solution was injected into the anterior chamber for a dose of 0.2 mg of intracameral antibiotic at the completion of the case.   Timolol and Brimonidine drops were applied to the eye.  The patient was taken to the recovery room in stable condition without complications of anesthesia or surgery.  Cordarro Spinnato 11/10/2017, 12:32 PM

## 2017-11-10 NOTE — Anesthesia Preprocedure Evaluation (Signed)
Anesthesia Evaluation  Patient identified by MRN, date of birth, ID band Patient awake    Reviewed: Allergy & Precautions, H&P , NPO status , Patient's Chart, lab work & pertinent test results  Airway Mallampati: I  TM Distance: >3 FB Neck ROM: full    Dental no notable dental hx.    Pulmonary former smoker,    Pulmonary exam normal        Cardiovascular hypertension, On Medications Normal cardiovascular exam  Implanted cardiac loop recorder.   Neuro/Psych Left sided weakness post CVA. CVA    GI/Hepatic   Endo/Other  Hypothyroidism   Renal/GU      Musculoskeletal   Abdominal   Peds  Hematology   Anesthesia Other Findings   Reproductive/Obstetrics                             Anesthesia Physical Anesthesia Plan  ASA: III  Anesthesia Plan: MAC   Post-op Pain Management:    Induction:   PONV Risk Score and Plan:   Airway Management Planned:   Additional Equipment:   Intra-op Plan:   Post-operative Plan:   Informed Consent: I have reviewed the patients History and Physical, chart, labs and discussed the procedure including the risks, benefits and alternatives for the proposed anesthesia with the patient or authorized representative who has indicated his/her understanding and acceptance.     Plan Discussed with:   Anesthesia Plan Comments:         Anesthesia Quick Evaluation

## 2017-11-10 NOTE — Transfer of Care (Signed)
Immediate Anesthesia Transfer of Care Note  Patient: Donna Rhodes  Procedure(s) Performed: CATARACT EXTRACTION PHACO AND INTRAOCULAR LENS PLACEMENT (IOC) LEFT (Left )  Patient Location: PACU  Anesthesia Type: MAC  Level of Consciousness: awake, alert  and patient cooperative  Airway and Oxygen Therapy: Patient Spontanous Breathing and Patient connected to supplemental oxygen  Post-op Assessment: Post-op Vital signs reviewed, Patient's Cardiovascular Status Stable, Respiratory Function Stable, Patent Airway and No signs of Nausea or vomiting  Post-op Vital Signs: Reviewed and stable  Complications: No apparent anesthesia complications

## 2017-11-11 ENCOUNTER — Encounter: Payer: Self-pay | Admitting: Ophthalmology

## 2017-11-15 ENCOUNTER — Ambulatory Visit: Payer: PPO | Admitting: Occupational Therapy

## 2017-11-15 ENCOUNTER — Encounter: Payer: Self-pay | Admitting: Occupational Therapy

## 2017-11-15 DIAGNOSIS — M6281 Muscle weakness (generalized): Secondary | ICD-10-CM | POA: Diagnosis not present

## 2017-11-15 NOTE — Therapy (Signed)
[place] MAIN [facility] SERVICES [address] [place], [place], [address] Phone: [phone]   Fax:  [phone]  Occupational Therapy Treatment  Patient Details  Name: Donna Rhodes MRN: [id number] Date of Birth: October 15, 1946 Referring Provider: Dr. Letta Pate   Encounter Date: 11/15/2017  OT End of Session - 11/15/17 1134    Visit Number  25    Number of Visits  48    Date for OT Re-Evaluation  01/03/18    Authorization Type  Medicare G Code 6 of 10    OT Start Time  1016    OT Stop Time  1100    OT Time Calculation (min)  44 min    Activity Tolerance  Patient tolerated treatment well    Behavior During Therapy  [facility] for tasks assessed/performed       Past Medical History:  Diagnosis Date  . Atrophic vaginitis   . Dysplasia of vagina    vin 1 - vulva and vaginal cuff  . GERD (gastroesophageal reflux disease)   . HCV infection   . HSV (herpes simplex virus) infection   . Hypercholesterolemia   . Hypertension   . Hypothyroidism   . Left-sided weakness    S/P CVA 4/18.  PT 2x/wk.  . Lung nodule    stable  . Menopausal state   . Osteoarthritis    hand involvement, cervical disc disease  . Osteoporosis   . Pelvic pain in female   . Rheumatoid arthritis ([place])   . Stroke (cerebrum) ([place]) 04/24/2017  . Thyroid nodule   . Vaginal Pap smear, abnormal    lgsil    Past Surgical History:  Procedure Laterality Date  . ADENOIDECTOMY    . APPENDECTOMY  1965  . [place]   left  . CATARACT EXTRACTION PHACO AND INTRAOCULAR LENS PLACEMENT ([place]) LEFT Left 11/10/2017   Performed by Leandrew Koyanagi, MD at [place]  . CATARACT EXTRACTION PHACO AND INTRAOCULAR LENS PLACEMENT ([place]) RIGHT Right 10/27/2017   Performed by Leandrew Koyanagi, MD at [place]  . CHOLECYSTECTOMY    . [place]  . laser vein surgery    . LOOP RECORDER INSERTION N/A 09/24/2017   Performed by Evans Lance, MD at  [place] [facility]  . MANDIBLE RECONSTRUCTION  1992  . TOENAIL EXCISION  1998   several  . [place]  . VAGINAL HYSTERECTOMY  1983   als0- anterior/posterior colporrhaphy  . VAGINAL WOUND CLOSURE / REPAIR  1999, 2000  . VAGINAL WOUND CLOSURE / REPAIR      There were no vitals filed for this visit.  Subjective Assessment - 11/15/17 1131    Subjective   Pt. reports that she has an appointment with Dr. Letta Pate tomorrow, and an appointment with her Neurologist within the next few weeks.    Patient is accompained by:  Family member    Pertinent History  Pt. is a 71 y.o. female who had a CVA on 04/24/2017. Pt. went to the ER when she started not feeling well, and having heart palpitations. Pt. reports having work up initially, however was sent home. Pt. reports later in the day she was unable to write with her left hand. Pt. returned to the ER, and was admitted with workup for a CVA. Pt. was admitted to [facility], and stayed for approximately 3 weeks. Pt. returned home alone, and  received home health services for the past 2  months. Pt. now presents for Outpatient OT services. Pt. resides home alone, and has support children who stop by daily, assist with shopping, transportation, and  ADL/IADL tasks as needed.     Pain Score  0-No pain       OT TREATMENT    Therapeutic Exercise:  Pt. presented with limited ROM today,  tone/tightness, intermittent pain 7/10). Pt. tolerated shoulder PROM for flexion, extension, horizontal abduction, external rotation, elbow flexion, extension, forearm supination, wrist extension, and digit extension. Pt. worked on [employer] shoulder flexion, abduction, elbow extension for hand to face patterns. AROM for forearm supination, and wrist extension. Pt. education was provided bout ROM, proper ROM techniques, performing ROM slowly, and position of UE during ROM. Pt. Requires verbal cues for redirection.  Manual Therapy:  Pt. Tolerated  scapular mobilizations in elevation, depression, abduction, and rotation to normalize tone, and prepare the LUE for ROM.                            OT Education - 11/15/17 1133    Education provided  Yes    Education Details  LUE ROM, strength, and coordination    Person(s) Educated  Patient    Methods  Explanation;Verbal cues    Comprehension  Verbalized understanding;Verbal cues required;Returned demonstration          OT Long Term Goals - 10/11/17 1219      OT LONG TERM GOAL #1   Title  Pt. will tolerate increased shoulder PROM by 20 degrees with pain improved to under 3/10    Baseline  Flexion: 91, Abduction: 67 10/11/2017: flexion: 108, Abduction: 77    Time  12    Period  Weeks    Status  On-going    Target Date  01/03/18      OT LONG TERM GOAL #2   Title  Pt. will formulate a full fist to be able to stabilize objects with her Left hand while using her right hand.     Baseline  Digit flexion to [facility]: 2nd digit: 4cm, 3rd: 5.5, 4th: 6, 5th: 4.    Time  12    Period  Weeks    Status  On-going    Target Date  01/03/18      OT LONG TERM GOAL #3   Title  Pt. will be able to wipe face independently with her Left UE.    Baseline  Pt. is unable 10/11/2017: hand-over hand assist required    Period  Weeks    Status  On-going    Target Date  01/03/18      OT LONG TERM GOAL #4   Title  Pt. will demonstrate independence with splint application.    Baseline  Pt. is unable to donn splint 10/11/2017: assist required    Time  12    Period  Weeks    Status  On-going    Target Date  01/03/18      OT LONG TERM GOAL #5   Title  Pt. will initiate active grasp release with thumb to 2nd digit 100% of the time to be able to hold a utensil in preparation for self-feeding.    Baseline  Minimal thumb movement. 10/11/2018: Pt. conitnues to have limited thumb motion.    Time  12    Period  Weeks    Status  On-going    Target Date  01/03/18      OT LONG TERM GOAL  #  6   Title  Pt. will initiate wrist extension in preparation for functional reaching.    Baseline  No active wrist extension. 10/11/2018: inconsistently initiating wrist extension.    Time  12    Period  Weeks    Status  On-going    Target Date  01/03/18            Plan - 11/15/17 1135    Clinical Impression Statement  Pt. continues to present with pain, and hypersensitivity in her LUE. The pain location changes, however the complaint of pain is constant. At times pt. reports pain, and soreness in the deltioid,  tripcep, bicep tendon, clavical, joint pain at the glemohumeral joint, as well as elbow joint. Pt. also reports 7/10 boney pain along the shaft of the Humerus. Pt. continues to present with limitations with LUE functioning. Pt. Requires verbal cues for redirection.   Occupational performance deficits (Please refer to evaluation for details):  ADL's;IADL's    Rehab Potential  Good    OT Frequency  2x / week    OT Duration  12 weeks    OT Treatment/Interventions  Self-care/ADL training;DME and/or AE instruction;Energy conservation;Therapeutic exercise;Neuromuscular education;Moist Heat;Therapeutic activities;Cognitive remediation/compensation;Therapeutic exercises;Patient/family education    Clinical Decision Making  Several treatment options, min-mod task modification necessary    Consulted and Agree with Plan of Care  Patient       Patient will benefit from skilled therapeutic intervention in order to improve the following deficits and impairments:  Decreased cognition, Difficulty walking, Decreased balance, Decreased strength, Decreased safety awareness, Decreased coordination, Impaired UE functional use, Impaired tone, Decreased range of motion, Decreased endurance, Decreased activity tolerance, Impaired perceived functional ability, Pain  Visit Diagnosis: Muscle weakness (generalized)    Problem List Patient Active Problem List   Diagnosis Date Noted  . Left arm pain  11/05/2017  . Adhesive capsulitis of left shoulder 08/06/2017  . Urinary frequency 07/11/2017  . Gait disturbance, post-stroke 05/31/2017  . Left spastic hemiparesis ([place]) 05/31/2017  . Adjustment disorder with mixed anxiety and depressed mood   . Hypokalemia   . HCAP (healthcare-associated pneumonia)   . Cough with fever   . 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 FUO (fever of unknown origin)   . Oral thrush   . Benign essential HTN   . Rheumatoid arthritis involving both hands ([place]) 04/28/2017  . Right-sided lacunar infarction 04/27/2017  . CVA (cerebral vascular accident) ([place]) 04/24/2017  . LGSIL Pap smear of vagina 12/15/2016  . Anal skin tag 02/02/2016  . Inflammatory arthritis 01/19/2016  . Right knee pain 01/19/2016  . Genital herpes 10/20/2015  . GERD (gastroesophageal reflux disease) 10/20/2015  . Status post vaginal hysterectomy 10/20/2015  . Health care maintenance 05/30/2015  . LLQ pain 03/31/2014  . Menopausal symptoms 03/31/2014  . Hypercholesterolemia 12/31/2012  . Hypothyroidism 12/29/2012  . Osteoarthritis 12/29/2012  . PULMONARY NODULE 01/02/2008  . Mild depression ([place]) 12/30/2007  . Essential hypertension 12/30/2007    Harrel Carina, MS, OTR/L 11/15/2017, 11:53 AM  [place] MAIN [facility] SERVICES [address] [place], [place], [address] Phone: [phone]   Fax:  [phone]  Name: DAILEY ALBERSON MRN: [id number] Date of Birth: 1946/01/07

## 2017-11-16 ENCOUNTER — Encounter: Payer: PPO | Attending: Physical Medicine & Rehabilitation

## 2017-11-16 ENCOUNTER — Encounter: Payer: Self-pay | Admitting: Physical Medicine & Rehabilitation

## 2017-11-16 ENCOUNTER — Ambulatory Visit (HOSPITAL_BASED_OUTPATIENT_CLINIC_OR_DEPARTMENT_OTHER): Payer: PPO | Admitting: Physical Medicine & Rehabilitation

## 2017-11-16 VITALS — BP 142/81 | HR 66

## 2017-11-16 DIAGNOSIS — M7502 Adhesive capsulitis of left shoulder: Secondary | ICD-10-CM | POA: Diagnosis not present

## 2017-11-16 DIAGNOSIS — Z7902 Long term (current) use of antithrombotics/antiplatelets: Secondary | ICD-10-CM | POA: Diagnosis not present

## 2017-11-16 DIAGNOSIS — M7989 Other specified soft tissue disorders: Secondary | ICD-10-CM | POA: Diagnosis not present

## 2017-11-16 DIAGNOSIS — I10 Essential (primary) hypertension: Secondary | ICD-10-CM | POA: Diagnosis not present

## 2017-11-16 DIAGNOSIS — R269 Unspecified abnormalities of gait and mobility: Secondary | ICD-10-CM | POA: Diagnosis not present

## 2017-11-16 DIAGNOSIS — M069 Rheumatoid arthritis, unspecified: Secondary | ICD-10-CM | POA: Diagnosis not present

## 2017-11-16 DIAGNOSIS — I69354 Hemiplegia and hemiparesis following cerebral infarction affecting left non-dominant side: Secondary | ICD-10-CM | POA: Diagnosis not present

## 2017-11-16 DIAGNOSIS — R531 Weakness: Secondary | ICD-10-CM | POA: Insufficient documentation

## 2017-11-16 DIAGNOSIS — E785 Hyperlipidemia, unspecified: Secondary | ICD-10-CM | POA: Insufficient documentation

## 2017-11-16 DIAGNOSIS — G8114 Spastic hemiplegia affecting left nondominant side: Secondary | ICD-10-CM | POA: Insufficient documentation

## 2017-11-16 DIAGNOSIS — M7542 Impingement syndrome of left shoulder: Secondary | ICD-10-CM

## 2017-11-16 DIAGNOSIS — Z7982 Long term (current) use of aspirin: Secondary | ICD-10-CM | POA: Insufficient documentation

## 2017-11-16 DIAGNOSIS — K219 Gastro-esophageal reflux disease without esophagitis: Secondary | ICD-10-CM | POA: Diagnosis not present

## 2017-11-16 NOTE — Progress Notes (Signed)
Subjective:    Patient ID: Donna Rhodes, female    DOB: April 10, 1946, 71 y.o.   MRN: [id number]  HPI Patient continues to complain of left shoulder pain. 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 She is undergone inpatient rehabilitation and is still getting outpatient rehabilitation.  Left shoulder pain is worse with abduction.  She has pain when she lays on the left side. No recent falls or trauma to that side.  X-rays of the shoulder demonstrated mild degenerative changes. Patient is asking for further workup such as an MRI to further evaluate. Pain is moderately severe when she moves the arm. She has tried over-the-counter medications without much relief.  She is not a good candidate for nonsteroidal anti-inflammatories because of her recent stroke. Pain Inventory Average Pain none unless moved Pain Right Now none unless moved My pain is sharp  In the last 24 hours, has pain interfered with the following? General activity . Relation with others . Enjoyment of life . What TIME of day is your pain at its worst? daytime Sleep (in general) Fair  Pain is worse with: some activites Pain improves with: rest Relief from Meds: na  Mobility walk without assistance ability to climb steps?  yes  Function retired  Neuro/Psych weakness numbness  Prior Studies Any changes since last visit?  no  Physicians involved in your care Any changes since last visit?  no   Family History  Problem Relation Age of Onset  . Hypertension Father   . Dementia Father   . Osteoarthritis Mother   . Arthritis/Rheumatoid Sister   . Breast cancer Unknown        paternal side  . Breast cancer Paternal Aunt   . Breast cancer Cousin        maternal  . Uterine cancer Maternal Grandmother   . Ovarian cancer Neg Hx   . Heart disease Neg Hx   . Diabetes Neg Hx   . Colon cancer Neg Hx    Social History   Socioeconomic History  .  Marital status: Divorced    Spouse name: Not on file  . Number of children: 3  . Years of education: 17  . Highest education level: Not on file  Social Needs  . Financial resource strain: Not on file  . Food insecurity - worry: Not on file  . Food insecurity - inability: Not on file  . Transportation needs - medical: Not on file  . Transportation needs - non-medical: Not on file  Occupational History    Comment: retired  Tobacco Use  . Smoking status: Former Smoker    Last attempt to quit: 12/29/1979    Years since quitting: 37.9  . Smokeless tobacco: Never Used  Substance and Sexual Activity  . Alcohol use: No    Alcohol/week: 0.0 oz  . Drug use: No  . Sexual activity: Not Currently    Birth control/protection: Surgical  Other Topics Concern  . Not on file  Social History Narrative   Lives alone   Caffeine- 1 cup coffee   Past Surgical History:  Procedure Laterality Date  . ADENOIDECTOMY    . APPENDECTOMY  1965  . [place]   left  . CATARACT EXTRACTION PHACO AND INTRAOCULAR LENS PLACEMENT ([place]) LEFT Left 11/10/2017   Performed by Leandrew Koyanagi, MD at [place]  . CATARACT EXTRACTION PHACO AND INTRAOCULAR LENS PLACEMENT ([place]) RIGHT Right 10/27/2017   Performed by Wallace Going,  Nila Nephew, MD at [place]  . CHOLECYSTECTOMY    . [place]  . laser vein surgery    . LOOP RECORDER INSERTION N/A 09/24/2017   Performed by Evans Lance, MD at [facility]  . MANDIBLE RECONSTRUCTION  1992  . TOENAIL EXCISION  1998   several  . [place]  . VAGINAL HYSTERECTOMY  1983   als0- anterior/posterior colporrhaphy  . VAGINAL WOUND CLOSURE / REPAIR  1999, 2000  . VAGINAL WOUND CLOSURE / REPAIR     Past Medical History:  Diagnosis Date  . Atrophic vaginitis   . Dysplasia of vagina    vin 1 - vulva and vaginal cuff  . GERD (gastroesophageal reflux disease)   . HCV infection   . HSV (herpes simplex virus) infection    . Hypercholesterolemia   . Hypertension   . Hypothyroidism   . Left-sided weakness    S/P CVA 4/18.  PT 2x/wk.  . Lung nodule    stable  . Menopausal state   . Osteoarthritis    hand involvement, cervical disc disease  . Osteoporosis   . Pelvic pain in female   . Rheumatoid arthritis ([place])   . Stroke (cerebrum) ([place]) 04/24/2017  . Thyroid nodule   . Vaginal Pap smear, abnormal    lgsil   LMP 12/29/1981   Opioid Risk Score:   Fall Risk Score:  `1  Depression screen PHQ 2/9  Depression screen [facility] 2/9 05/26/2017 10/23/2016 09/30/2015 05/31/2015 09/11/2014 05/29/2013 12/31/2012  Decreased Interest 0 0 0 0 0 0 0  Down, Depressed, Hopeless 0 0 0 0 0 0 0  PHQ - 2 Score 0 0 0 0 0 0 0  Some recent data might be hidden     Review of Systems  Constitutional: Positive for appetite change and unexpected weight change.  HENT: Negative.   Eyes: Negative.   Respiratory: Negative.   Cardiovascular: Negative.   Gastrointestinal: Negative.   Endocrine: Negative.   Genitourinary: Negative.   Musculoskeletal: Negative.   Skin: Negative.   Allergic/Immunologic: Negative.   Neurological: Negative.   Hematological: Negative.   Psychiatric/Behavioral: Negative.   All other systems reviewed and are negative.      Objective:   Physical Exam  Musculoskeletal:       Left shoulder: She exhibits decreased range of motion and deformity. She exhibits no tenderness, no effusion and no crepitus.    Left shoulder subluxation 1 finger breath Pain with shoulder external rotation 2- elbow flexion on the left 0 abduction, trace finger flexion extension on the left side. Positive Hawkins Unable to perform O'Brien's due to weakness.  She does have pain over the bicipital groove. Patient states that pain goes into the left bicep area but has no tenderness over the left bicep no swelling no ecchymosis.     Assessment & Plan:  #1.  Hemiplegic shoulder pain left side.  Exam nonspecific she has external  rotation limitations and pain which would be consistent with frozen shoulder.  She also has a severe shoulder subluxation. We discussed that MRI would likely not change any management given that she is not a good surgical candidate for any type of rotator cuff repair given her stroke and age.  We discussed that ultrasound would be helpful in looking for any type of rotator cuff tears. We discussed treatment options and given her history of stroke she is not a good candidate for stronger narcotic analgesics which may increase  her fall risk also not a good candidate for nonsteroidal anti-inflammatories which can increase stroke risk Have discussed percutaneous electrical nerve stimulation which can be performed for 6 hours/day for total of 2 months.  This can be removed after that time.  Office-based procedure.  Left shoulder complete ultrasound  Patient examined in sitting position, left bicipital groove short axis view showed normal tendon with, no evidence of subluxation with external rotation. Scanned into the biceps muscle there is no evidence of hematoma or swelling around the biceps tendon insertion. Longest axis view at the bicipital groove shows intact biceps tendon minimal little amount of fluid around the tendon, 2 mm Left subscapularis shows irregularity at insertion site on short axis view as well as calcifications. Long axis view showed no overt tears difficult to get good positioning secondary to her shoulder contracture.  Left supraspinatus tendon long axis view shows distal articular surface and bursal surface anechoic Evalyn Casco areas.  These were also seen on short axis views to confirm both partial and complete tears.  Left infraspinatus tendon short axis view, tendon thickness is diminished however no evidence of tear seen. Left infraspinatus tendon short axis view no evidence of tear.  Left acromioclavicular joint has irregularities.  No capsular distention.  Impression 1.   Abnormal study 2.  Evidence of left supraspinatus partial and complete tears affecting the distal tendon area. 2.  Calcific tendinitis left subscapularis tendon 3.  Left acromioclavicular joint irregularity is consistent with osteoarthritis  Charlett Blake M.D. [facility] FAAPM&R (Sports Med, Neuromuscular Med) Diplomate Am Board of Electrodiagnostic Med

## 2017-11-16 NOTE — Patient Instructions (Signed)
Avoid abd >90Deg  Try Give More Sling  Will recommend SPR [employer] Sprint Peripheral nerve stimulator

## 2017-11-17 ENCOUNTER — Telehealth: Payer: Self-pay

## 2017-11-17 NOTE — Telephone Encounter (Signed)
error 

## 2017-11-17 NOTE — Telephone Encounter (Signed)
Oleh Genin called today and asked what the status was on the [facility] paperwork.

## 2017-11-22 NOTE — Telephone Encounter (Signed)
  Can we handicapped placard for patient?    Copied from [place]. Topic: Inquiry >> Nov 22, 2017 11:32 AM Oliver Pila B wrote: Reason for CRM: pt called and is asking that she can get a handicap sticker for her vehicle sent to her thru the mail, contact pt if needed

## 2017-11-22 NOTE — Telephone Encounter (Signed)
Signed and placed in my CMAs box.  Pt will need to complete her part.

## 2017-11-23 ENCOUNTER — Ambulatory Visit (INDEPENDENT_AMBULATORY_CARE_PROVIDER_SITE_OTHER): Payer: PPO | Admitting: *Deleted

## 2017-11-23 ENCOUNTER — Ambulatory Visit: Payer: PPO | Admitting: Physical Therapy

## 2017-11-23 ENCOUNTER — Encounter: Payer: Self-pay | Admitting: Occupational Therapy

## 2017-11-23 ENCOUNTER — Encounter: Payer: Self-pay | Admitting: Physical Therapy

## 2017-11-23 ENCOUNTER — Telehealth: Payer: Self-pay | Admitting: *Deleted

## 2017-11-23 ENCOUNTER — Ambulatory Visit: Payer: PPO | Admitting: Occupational Therapy

## 2017-11-23 DIAGNOSIS — M6281 Muscle weakness (generalized): Secondary | ICD-10-CM

## 2017-11-23 DIAGNOSIS — R2689 Other abnormalities of gait and mobility: Secondary | ICD-10-CM

## 2017-11-23 DIAGNOSIS — I639 Cerebral infarction, unspecified: Secondary | ICD-10-CM

## 2017-11-23 DIAGNOSIS — R278 Other lack of coordination: Secondary | ICD-10-CM

## 2017-11-23 NOTE — Telephone Encounter (Signed)
Ms. [place] calling in regards to her Remote Pacer Check scheduled today. She was under the impression she would get a call at the appt time. I explained that her monitor sent [place] reports automatically and that we did receive her Summary Report. She verbalizes understanding.

## 2017-11-23 NOTE — Therapy (Signed)
[place] MAIN [facility] SERVICES [address] [place], [place], [address] Phone: [phone]   Fax:  [phone]  Occupational Therapy Treatment  Patient Details  Name: Donna Rhodes MRN: [id number] Date of Birth: 08-09-1946 Referring Provider: Dr. Letta Pate   Encounter Date: 11/23/2017  OT End of Session - 11/23/17 1155    Visit Number  26    Number of Visits  69    Date for OT Re-Evaluation  01/03/18    Authorization Type  Medicare G Code 7 of 10    OT Start Time  1100    OT Stop Time  1145    OT Time Calculation (min)  45 min    Activity Tolerance  Patient tolerated treatment well    Behavior During Therapy  [facility] for tasks assessed/performed       Past Medical History:  Diagnosis Date  . Atrophic vaginitis   . Dysplasia of vagina    vin 1 - vulva and vaginal cuff  . GERD (gastroesophageal reflux disease)   . HCV infection   . HSV (herpes simplex virus) infection   . Hypercholesterolemia   . Hypertension   . Hypothyroidism   . Left-sided weakness    S/P CVA 4/18.  PT 2x/wk.  . Lung nodule    stable  . Menopausal state   . Osteoarthritis    hand involvement, cervical disc disease  . Osteoporosis   . Pelvic pain in female   . Rheumatoid arthritis ([place])   . Stroke (cerebrum) ([place]) 04/24/2017  . Thyroid nodule   . Vaginal Pap smear, abnormal    lgsil    Past Surgical History:  Procedure Laterality Date  . ADENOIDECTOMY    . APPENDECTOMY  1965  . [place]   left  . CATARACT EXTRACTION W/PHACO Right 10/27/2017   Procedure: CATARACT EXTRACTION PHACO AND INTRAOCULAR LENS PLACEMENT ([place]) RIGHT;  Surgeon: Leandrew Koyanagi, MD;  Location: [place];  Service: Ophthalmology;  Laterality: Right;  requests early (8-8:30 arrival)  . CATARACT EXTRACTION W/PHACO Left 11/10/2017   Procedure: CATARACT EXTRACTION PHACO AND INTRAOCULAR LENS PLACEMENT ([place]) LEFT;  Surgeon: Leandrew Koyanagi, MD;  Location:  [place];  Service: Ophthalmology;  Laterality: Left;  . CHOLECYSTECTOMY    . [place]  . laser vein surgery    . LOOP RECORDER INSERTION N/A 09/24/2017   Procedure: LOOP RECORDER INSERTION;  Surgeon: Evans Lance, MD;  Location: [place] [facility];  Service: Cardiovascular;  Laterality: N/A;  . MANDIBLE RECONSTRUCTION  1992  . TOENAIL EXCISION  1998   several  . [place]  . VAGINAL HYSTERECTOMY  1983   als0- anterior/posterior colporrhaphy  . VAGINAL WOUND CLOSURE / REPAIR  1999, 2000  . VAGINAL WOUND CLOSURE / REPAIR      OT TREATMENT    Therapeutic Exercise:  Pt. was improved today. Pt. tolerated shoulder PROM for flexion, extension, horizontal abduction, external rotation, elbow flexion, extension, forearm supination, wrist extension, and digit extension. Pt.tolerated forPROM shoulder flexion, abduction, elbow extensionfor hand to face patterns. AROM forforearm supination, and wrist extension. Pt. continues to present with limited external rotation with tightness in the internal rotators.  Manual Therapy:  Pt. Tolerated scapular mobilizations in elevation, depression, abduction, and rotation to normalize tone, and prepare the LUE for ROM.  OT Education - 11/23/17 1155    Education provided  Yes    Education Details  LUE ROM    Person(s) Educated  Patient    Methods  Explanation;Demonstration;Verbal cues    Comprehension  Verbalized understanding;Returned demonstration;Verbal cues required;Need further instruction          OT Long Term Goals - 10/11/17 1219      OT LONG TERM GOAL #1   Title  Pt. will tolerate increased shoulder PROM by 20 degrees with pain improved to under 3/10    Baseline  Flexion: 91, Abduction: 67 10/11/2017: flexion: 108, Abduction: 77    Time  12    Period  Weeks    Status  On-going    Target Date  01/03/18      OT LONG TERM GOAL #2    Title  Pt. will formulate a full fist to be able to stabilize objects with her Left hand while using her right hand.     Baseline  Digit flexion to [facility]: 2nd digit: 4cm, 3rd: 5.5, 4th: 6, 5th: 4.    Time  12    Period  Weeks    Status  On-going    Target Date  01/03/18      OT LONG TERM GOAL #3   Title  Pt. will be able to wipe face independently with her Left UE.    Baseline  Pt. is unable 10/11/2017: hand-over hand assist required    Period  Weeks    Status  On-going    Target Date  01/03/18      OT LONG TERM GOAL #4   Title  Pt. will demonstrate independence with splint application.    Baseline  Pt. is unable to donn splint 10/11/2017: assist required    Time  12    Period  Weeks    Status  On-going    Target Date  01/03/18      OT LONG TERM GOAL #5   Title  Pt. will initiate active grasp release with thumb to 2nd digit 100% of the time to be able to hold a utensil in preparation for self-feeding.    Baseline  Minimal thumb movement. 10/11/2018: Pt. conitnues to have limited thumb motion.    Time  12    Period  Weeks    Status  On-going    Target Date  01/03/18      OT LONG TERM GOAL #6   Title  Pt. will initiate wrist extension in preparation for functional reaching.    Baseline  No active wrist extension. 10/11/2018: inconsistently initiating wrist extension.    Time  12    Period  Weeks    Status  On-going    Target Date  01/03/18            Plan - 11/23/17 1155    Clinical Impression Statement  Pt. had a follow-up appointment with Dr. Letta Pate last week. Pt. had an ultrasound performed which revealed  left supraspinatus partial and complete tears affecting the distal tendon area, Calcific tendinitis left subscapularis tendon, and Left acromioclavicular joint irregularity which is consistent with osteoarthritis. Pt. is not a surgical candidate. Pt. will be having a percutaneous peripheral stimulator placed at the end of December. LUE ROM is limited. Pt. was able to  achieve slow gentle ROM  of 72 degrees of shoulder abduction, and 87 of flexion without pain. Dr. Letta Pate is recommending pt. to try The Give More Sling.    Occupational performance  deficits (Please refer to evaluation for details):  ADL's;IADL's    Rehab Potential  Good    OT Frequency  2x / week    OT Duration  12 weeks    OT Treatment/Interventions  Self-care/ADL training;DME and/or AE instruction;Energy conservation;Therapeutic exercise;Neuromuscular education;Moist Heat;Therapeutic activities;Cognitive remediation/compensation;Therapeutic exercises;Patient/family education    Clinical Decision Making  Several treatment options, min-mod task modification necessary    Consulted and Agree with Plan of Care  Patient       Patient will benefit from skilled therapeutic intervention in order to improve the following deficits and impairments:  Decreased cognition, Difficulty walking, Decreased balance, Decreased strength, Decreased safety awareness, Decreased coordination, Impaired UE functional use, Impaired tone, Decreased range of motion, Decreased endurance, Decreased activity tolerance, Impaired perceived functional ability, Pain  Visit Diagnosis: Muscle weakness (generalized)    Problem List Patient Active Problem List   Diagnosis Date Noted  . Left arm pain 11/05/2017  . Adhesive capsulitis of left shoulder 08/06/2017  . Urinary frequency 07/11/2017  . Gait disturbance, post-stroke 05/31/2017  . Left spastic hemiparesis ([place]) 05/31/2017  . Adjustment disorder with mixed anxiety and depressed mood   . Hypokalemia   . HCAP (healthcare-associated pneumonia)   . Cough with fever   . 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 FUO (fever of unknown origin)   . Oral thrush   . Benign essential HTN   . Rheumatoid arthritis involving both hands ([place]) 04/28/2017  . Right-sided lacunar infarction 04/27/2017  . CVA (cerebral vascular accident) ([place]) 04/24/2017  . LGSIL Pap smear  of vagina 12/15/2016  . Anal skin tag 02/02/2016  . Inflammatory arthritis 01/19/2016  . Right knee pain 01/19/2016  . Genital herpes 10/20/2015  . GERD (gastroesophageal reflux disease) 10/20/2015  . Status post vaginal hysterectomy 10/20/2015  . Health care maintenance 05/30/2015  . LLQ pain 03/31/2014  . Menopausal symptoms 03/31/2014  . Hypercholesterolemia 12/31/2012  . Hypothyroidism 12/29/2012  . Osteoarthritis 12/29/2012  . PULMONARY NODULE 01/02/2008  . Mild depression ([place]) 12/30/2007  . Essential hypertension 12/30/2007    Harrel Carina, MS, OTR/L 11/23/2017, 12:04 PM  [place] MAIN [facility] SERVICES [address] [place], [place], [address] Phone: [phone]   Fax:  [phone]  Name: Donna Rhodes MRN: [id number] Date of Birth: 1946-09-13

## 2017-11-23 NOTE — Therapy (Signed)
[place] MAIN [facility] SERVICES [address] [place], [place], [address] Phone: [phone]   Fax:  [phone]  Physical Therapy Treatment/Progress Note  Patient Details  Name: Donna Rhodes MRN: [id number] Date of Birth: 1946-01-21 Referring Provider: Alysia Penna   Encounter Date: 11/23/2017  PT End of Session - 11/23/17 1022    Visit Number  20    Number of Visits  41    Date for PT Re-Evaluation  01/18/18    Authorization Type  gcode 10    Authorization Time Period  10    PT Start Time  1015    PT Stop Time  1100    PT Time Calculation (min)  45 min    Equipment Utilized During Treatment  Gait belt    Activity Tolerance  Patient tolerated treatment well;No increased pain    Behavior During Therapy  WFL for tasks assessed/performed       Past Medical History:  Diagnosis Date  . Atrophic vaginitis   . Dysplasia of vagina    vin 1 - vulva and vaginal cuff  . GERD (gastroesophageal reflux disease)   . HCV infection   . HSV (herpes simplex virus) infection   . Hypercholesterolemia   . Hypertension   . Hypothyroidism   . Left-sided weakness    S/P CVA 4/18.  PT 2x/wk.  . Lung nodule    stable  . Menopausal state   . Osteoarthritis    hand involvement, cervical disc disease  . Osteoporosis   . Pelvic pain in female   . Rheumatoid arthritis ([place])   . Stroke (cerebrum) ([place]) 04/24/2017  . Thyroid nodule   . Vaginal Pap smear, abnormal    lgsil    Past Surgical History:  Procedure Laterality Date  . ADENOIDECTOMY    . APPENDECTOMY  1965  . [place]   left  . CATARACT EXTRACTION W/PHACO Right 10/27/2017   Procedure: CATARACT EXTRACTION PHACO AND INTRAOCULAR LENS PLACEMENT ([place]) RIGHT;  Surgeon: Leandrew Koyanagi, MD;  Location: [place];  Service: Ophthalmology;  Laterality: Right;  requests early (8-8:30 arrival)  . CATARACT EXTRACTION W/PHACO Left 11/10/2017   Procedure: CATARACT  EXTRACTION PHACO AND INTRAOCULAR LENS PLACEMENT ([place]) LEFT;  Surgeon: Leandrew Koyanagi, MD;  Location: [place];  Service: Ophthalmology;  Laterality: Left;  . CHOLECYSTECTOMY    . [place]  . laser vein surgery    . LOOP RECORDER INSERTION N/A 09/24/2017   Procedure: LOOP RECORDER INSERTION;  Surgeon: Evans Lance, MD;  Location: [place] [facility];  Service: Cardiovascular;  Laterality: N/A;  . MANDIBLE RECONSTRUCTION  1992  . TOENAIL EXCISION  1998   several  . [place]  . VAGINAL HYSTERECTOMY  1983   als0- anterior/posterior colporrhaphy  . VAGINAL WOUND CLOSURE / REPAIR  1999, 2000  . VAGINAL WOUND CLOSURE / REPAIR      There were no vitals filed for this visit.  Subjective Assessment - 11/23/17 1019    Subjective  Patient reports getting [place] to left UE and reports that Dr. 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 He recommended PNS to UE; She is following up orthopedic MD regarding shoulder  pain; She reports still having some pulling in posterior LLE ; She denies any new falls; Reports that her walking is pretty good in the morning and by afternoon her foot starts turning out;     Patient is  accompained by:  Family member    Pertinent History  Pt is a 71 y.o female that presents to therapy s/p CVA; On April 24, 2017 pt suffered an acute infarct in right basal ganglia. 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; Pt fell out of bed one day and fell on L UE; Pt reported doctor put a shot in L shoulder and will receive 3 more shots in shoulder; Pt reports no sensation or numbness and tingling with LE just some sensation impaired in L hand; Pt has an air cast for L LE and splint for wrist but does not use because difficulty  donning;  Pt has had trouble sleeping at night with waking and going to the bathroom but denies any pain;  Only has pain in shoulder when moving described as throbbing; Pt reports not using her cane in the last month and states her balance is fine without it;     Limitations  Lifting;Standing;Walking    How long can you sit comfortably?  hours     How long can you stand comfortably?  30 min     How long can you walk comfortably?  better in the morning because more energy;     Diagnostic tests  x rays in shoulder and hand; all clear; doctor stated "inferior pseudo-subluxation from paralysis of the deltoid. she has advanced left hand arthritis"    Patient Stated Goals  walking better without the cane;     Currently in Pain?  No/denies         [facility] PT Assessment - 11/23/17 0001      Strength   Right Hip Flexion  5/5    Right Hip ABduction  5/5    Right Hip ADduction  5/5    Left Hip Flexion  5/5    Left Hip ABduction  4+/5    Left Hip ADduction  5/5    Right Knee Flexion  5/5    Right Knee Extension  5/5    Left Knee Flexion  4+/5    Left Knee Extension  4+/5    Right Ankle Dorsiflexion  5/5    Left Ankle Dorsiflexion  4/5      6 minute walk test results    Aerobic Endurance Distance Walked  1245    Endurance additional comments  without AD, with good reciprocal gait pattern, does have increased LLE knee flexion at initial contact and through mid stance; doesn't have foot drag;       Standardized Balance Assessment   10 Meter Walk  1.25 m/s without AD, community ambulator, low fall risk;           TREATMENT: Warm up on Nustep BLE only level 2 x4 min (unbilled);  Instructed patient in 10 meter walk, 6 min walk, assessed strength etc to address goals; She required min VCs for correct activity technique and positioning for better safety; Patient does exhibit improved gait speed but ambulates with increased LLE knee flexion at initial contact/mid stance which limits ankle DF and  foot clearance;    Instructed patient in LLE quad strengthening with ankle ROM: Standing against wall, small green ball against wall, quad set 5 sec hold x10 reps with cues for ankle DF during quad set to strengthening LE position for  initial contact;  Standing at step: Heel off step calf stretch LLE only 15 sec hold x1 rep- added to HEP; Standing with LLE on small blue block against step for ankle DF with cues for knee extension calf/hamsting stretch 15 sec hold x3 reps; With min VCs for positioning to improve stretch; Patient had increased difficulty due to LLE ankle DF stiffness;  Standing in parallel bars: Forward step with LLE with verbal/visual cues to increase knee extension and ankle DF at heel strike using stepping stone as target x10 reps; Patient had difficulty keeping RLE knee straight for better weight shift; She also had difficulty with LLE proprioception/control due to instability in LLE hip;   Patient reports increased fatigue at end of session; Denies any pain:                 PT Education - 11/23/17 1022    Education provided  Yes    Education Details  LE strengthening, balance, gait safety; progress towards goals;     Person(s) Educated  Patient    Methods  Explanation;Demonstration;Verbal cues    Comprehension  Verbalized understanding;Returned demonstration;Verbal cues required;Need further instruction          PT Long Term Goals - 11/23/17 1023      PT LONG TERM GOAL #1   Title  Pt will become independent with HEP in order to improve ADL independence;    Baseline  Pt performs several exercises frequently, but inconsistently performs other exercises.     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 on 8/28; improved to 1.25 m/s on 11/23/17    Time  12    Period  Weeks    Status  Achieved    Target Date  01/18/18      PT LONG TERM GOAL #4   Title  Pt will improve 6MWT to >1300 feet in order to be considered 80% of age group norm and to improve activity tolerance and community ambulation distance for improved IADLs (revised from 1000")    Baseline  565 feet, improved to 875' on 8/28, improved to 1105' on 9/19; improved to 1245 on 11/23/17    Time  12    Period  Weeks    Status  Partially Met    Target Date  01/18/18      PT LONG TERM GOAL #5   Title  Pt will increase strength in L LE to 4+/5 for all muscles in order to improve mobility and function with daily activities;     Baseline  pt is 4+/5 in all LE except for L ankle  DF: 4/5    Time  12    Period  Weeks    Status  Partially Met    Target Date  01/18/18      PT LONG TERM GOAL #6   Title  Pt will ambulate with equal stride length, no toe drag on LLE, and equal stance and swing time bilaterally to demonstrate improved gait mechanics for more efficient and functional mobility.    Time  12    Period  Weeks    Status  Partially Met    Target Date  01/18/18            Plan - 15-Dec-2017 1117    Clinical Impression Statement  PT assessed goals to determine progress. Patient does exhibit improved gait speed. However she is still ambulating with slight ataxic pattern with increased LLE knee flexion at initial contact with flat foot at initial contact exhibiting decreased foot clearance. She is able to ambulate mod I on even surface. Patient also exhibit weakness and stiffness in LLE ankle.  Instructed patient in advanced LLE quad activation exercise to improve terminal knee extension at initial contact; She required increased cues for sequencing and foot placement; She would benefit from additional skilled PT intervention to improve gait mechanics to reduce fall risk with ADLs;     Rehab Potential  Fair    Clinical Impairments Affecting Rehab Potential  neg: age, lives alone, comorbidities, pos: wants to be more independent; already high functioning     PT Frequency  2x / week    PT Duration  8 weeks    PT Treatment/Interventions  ADLs/Self Care Home Management;Aquatic Therapy;Moist Heat;Cryotherapy;DME Instruction;Gait training;Stair training;Functional mobility training;Therapeutic activities;Therapeutic exercise;Balance training;Neuromuscular re-education;Manual techniques;Passive range of motion;Energy conservation;Electrical Stimulation;Patient/family education;Orthotic Fit/Training;Dry needling    PT Next Visit Plan  berg; stairs; strengthening, Assess adductor tightness    PT Home Exercise Plan  continue as given;     Consulted and Agree with Plan of Care  Patient       Patient will benefit from skilled therapeutic intervention in order to improve the following deficits and impairments:  Abnormal gait, Impaired sensation, Decreased mobility, Decreased activity tolerance, Decreased endurance, Decreased range of motion, Decreased strength, Impaired UE functional use, Decreased balance, Decreased safety awareness, Difficulty walking, Pain  Visit Diagnosis: Muscle weakness (generalized)  Other lack of coordination  Other abnormalities of gait and mobility   G-Codes - 2017/12/15 1120    Functional Assessment Tool Used (Outpatient Only)  6 min walk,  10 meter walk, strength/clinical judgement;     Functional Limitation  Mobility: Walking and moving around    Mobility: Walking and Moving Around Current Status [phone])  At least 20 percent but less than 40 percent impaired, limited  or restricted    Mobility: Walking and Moving Around Goal Status [phone])  At least 20 percent but less than 40 percent impaired, limited or restricted       Problem List Patient Active Problem List   Diagnosis Date Noted  . Left arm pain 11/05/2017  . Adhesive capsulitis of left shoulder 08/06/2017  . Urinary frequency 07/11/2017  . Gait disturbance, post-stroke 05/31/2017  . Left spastic hemiparesis ([place]) 05/31/2017  . Adjustment disorder with mixed anxiety and depressed mood   . Hypokalemia   . HCAP (healthcare-associated pneumonia)   . Cough with fever   . 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 FUO (fever of unknown origin)   . Oral thrush   . Benign essential HTN   .  Rheumatoid arthritis involving both hands ([place]) 04/28/2017  . Right-sided lacunar infarction 04/27/2017  . CVA (cerebral vascular accident) ([place]) 04/24/2017  . LGSIL Pap smear of vagina 12/15/2016  . Anal skin tag 02/02/2016  . Inflammatory arthritis 01/19/2016  . Right knee pain 01/19/2016  . Genital herpes 10/20/2015  . GERD (gastroesophageal reflux disease) 10/20/2015  . Status post vaginal hysterectomy 10/20/2015  . Health care maintenance 05/30/2015  . LLQ pain 03/31/2014  . Menopausal symptoms 03/31/2014  . Hypercholesterolemia 12/31/2012  . Hypothyroidism 12/29/2012  . Osteoarthritis 12/29/2012  . PULMONARY NODULE 01/02/2008  . Mild depression ([place]) 12/30/2007  . Essential hypertension 12/30/2007    Decker Cogdell PT, DPT 11/23/2017, 11:20 AM  [place] MAIN [facility] SERVICES [address] [place], [place], [address] Phone: [phone]   Fax:  [phone]  Name: Donna Rhodes MRN: [id number] Date of Birth: June 21, 1946

## 2017-11-24 NOTE — Progress Notes (Signed)
Carelink Summary Report / Loop Recorder 

## 2017-11-25 ENCOUNTER — Ambulatory Visit: Payer: PPO | Admitting: Occupational Therapy

## 2017-11-30 ENCOUNTER — Ambulatory Visit: Payer: PPO

## 2017-12-01 ENCOUNTER — Ambulatory Visit: Payer: PPO | Admitting: Physical Therapy

## 2017-12-01 ENCOUNTER — Ambulatory Visit: Payer: PPO | Admitting: Occupational Therapy

## 2017-12-03 ENCOUNTER — Encounter: Payer: Self-pay | Admitting: Occupational Therapy

## 2017-12-03 ENCOUNTER — Other Ambulatory Visit: Payer: Self-pay

## 2017-12-03 ENCOUNTER — Ambulatory Visit: Payer: PPO | Admitting: Physical Therapy

## 2017-12-03 ENCOUNTER — Encounter: Payer: Self-pay | Admitting: Physical Therapy

## 2017-12-03 ENCOUNTER — Ambulatory Visit: Payer: PPO | Attending: Physical Medicine & Rehabilitation | Admitting: Occupational Therapy

## 2017-12-03 VITALS — BP 128/78 | HR 72

## 2017-12-03 DIAGNOSIS — R2689 Other abnormalities of gait and mobility: Secondary | ICD-10-CM

## 2017-12-03 DIAGNOSIS — R278 Other lack of coordination: Secondary | ICD-10-CM

## 2017-12-03 DIAGNOSIS — M6281 Muscle weakness (generalized): Secondary | ICD-10-CM | POA: Diagnosis not present

## 2017-12-03 DIAGNOSIS — I69352 Hemiplegia and hemiparesis following cerebral infarction affecting left dominant side: Secondary | ICD-10-CM | POA: Diagnosis not present

## 2017-12-03 NOTE — Therapy (Signed)
[place] MAIN [facility] SERVICES [address] [place], [place], [address] Phone: [phone]   Fax:  [phone]  Occupational Therapy Treatment  Patient Details  Name: Donna Rhodes MRN: [id number] Date of Birth: 1946/07/06 Referring Provider: Dr. Letta Pate   Encounter Date: 12/03/2017  OT End of Session - 12/03/17 1102    Visit Number  27    Number of Visits  38    Date for OT Re-Evaluation  01/03/18    Authorization Type  Medicare G Code 8 of 10    OT Start Time  1007    OT Stop Time  1050    OT Time Calculation (min)  43 min    Activity Tolerance  Patient tolerated treatment well    Behavior During Therapy  [facility] for tasks assessed/performed       Past Medical History:  Diagnosis Date  . Atrophic vaginitis   . Dysplasia of vagina    vin 1 - vulva and vaginal cuff  . GERD (gastroesophageal reflux disease)   . HCV infection   . HSV (herpes simplex virus) infection   . Hypercholesterolemia   . Hypertension   . Hypothyroidism   . Left-sided weakness    S/P CVA 4/18.  PT 2x/wk.  . Lung nodule    stable  . Menopausal state   . Osteoarthritis    hand involvement, cervical disc disease  . Osteoporosis   . Pelvic pain in female   . Rheumatoid arthritis ([place])   . Stroke (cerebrum) ([place]) 04/24/2017  . Thyroid nodule   . Vaginal Pap smear, abnormal    lgsil    Past Surgical History:  Procedure Laterality Date  . ADENOIDECTOMY    . APPENDECTOMY  1965  . [place]   left  . CATARACT EXTRACTION W/PHACO Right 10/27/2017   Procedure: CATARACT EXTRACTION PHACO AND INTRAOCULAR LENS PLACEMENT ([place]) RIGHT;  Surgeon: Leandrew Koyanagi, MD;  Location: [place];  Service: Ophthalmology;  Laterality: Right;  requests early (8-8:30 arrival)  . CATARACT EXTRACTION W/PHACO Left 11/10/2017   Procedure: CATARACT EXTRACTION PHACO AND INTRAOCULAR LENS PLACEMENT ([place]) LEFT;  Surgeon: Leandrew Koyanagi, MD;  Location:  [place];  Service: Ophthalmology;  Laterality: Left;  . CHOLECYSTECTOMY    . [place]  . laser vein surgery    . LOOP RECORDER INSERTION N/A 09/24/2017   Procedure: LOOP RECORDER INSERTION;  Surgeon: Evans Lance, MD;  Location: [place] CV LAB;  Service: Cardiovascular;  Laterality: N/A;  . MANDIBLE RECONSTRUCTION  1992  . TOENAIL EXCISION  1998   several  . [place]  . VAGINAL HYSTERECTOMY  1983   als0- anterior/posterior colporrhaphy  . VAGINAL WOUND CLOSURE / REPAIR  1999, 2000  . VAGINAL WOUND CLOSURE / REPAIR      There were no vitals filed for this visit.  Subjective Assessment - 12/03/17 1059    Subjective   Pt. reports her daughter needs to go into rehab for drug addiction. Pt. reports she has to let her in the house in the middle of the night. Pt. reports she may be losing weight because of stress.    Patient is accompained by:  Family member    Pertinent History  Pt. is a 71 y.o. female who had a CVA on 04/24/2017. Pt. went to the ER when she started not feeling well, and having heart palpitations. Pt. reports having work up initially, however was sent home. Pt. reports  later in the day she was unable to write with her left hand. Pt. returned to the ER, and was admitted with workup for a CVA. Pt. was admitted to [facility], and stayed for approximately 3 weeks. Pt. returned home alone, and  received home health services for the past 2 months. Pt. now presents for Outpatient OT services. Pt. resides home alone, and has support children who stop by daily, assist with shopping, transportation, and  ADL/IADL tasks as needed.     Currently in Pain?  No/denies      OT TREATMENT    Therapeutic Exercise:  Pt. worked on PROM, followed by [facility] for shoulder flexion to 90, abduction 70, horizontal abduction, elbow flexion, extension, AROM for forearm supination. Pt. worked on wrist, and digit extension with  facilitation.  Selfcare:  Reviewed information on the Give Mohr sling recommended by her Physician. Pt. was provided with information about the sling. Pt. Reports she is not sure that she will like the strap under the elbow, and the starp for under the right axilla area.                          OT Education - 12/03/17 1102    Education provided  Yes    Education Details  LUE ROM    Person(s) Educated  Patient    Methods  Explanation;Demonstration;Verbal cues    Comprehension  Verbalized understanding;Returned demonstration;Verbal cues required;Need further instruction          OT Long Term Goals - 10/11/17 1219      OT LONG TERM GOAL #1   Title  Pt. will tolerate increased shoulder PROM by 20 degrees with pain improved to under 3/10    Baseline  Flexion: 91, Abduction: 67 10/11/2017: flexion: 108, Abduction: 77    Time  12    Period  Weeks    Status  On-going    Target Date  01/03/18      OT LONG TERM GOAL #2   Title  Pt. will formulate a full fist to be able to stabilize objects with her Left hand while using her right hand.     Baseline  Digit flexion to [facility]: 2nd digit: 4cm, 3rd: 5.5, 4th: 6, 5th: 4.    Time  12    Period  Weeks    Status  On-going    Target Date  01/03/18      OT LONG TERM GOAL #3   Title  Pt. will be able to wipe face independently with her Left UE.    Baseline  Pt. is unable 10/11/2017: hand-over hand assist required    Period  Weeks    Status  On-going    Target Date  01/03/18      OT LONG TERM GOAL #4   Title  Pt. will demonstrate independence with splint application.    Baseline  Pt. is unable to donn splint 10/11/2017: assist required    Time  12    Period  Weeks    Status  On-going    Target Date  01/03/18      OT LONG TERM GOAL #5   Title  Pt. will initiate active grasp release with thumb to 2nd digit 100% of the time to be able to hold a utensil in preparation for self-feeding.    Baseline  Minimal thumb  movement. 10/11/2018: Pt. conitnues to have limited thumb motion.    Time  12  Period  Weeks    Status  On-going    Target Date  01/03/18      OT LONG TERM GOAL #6   Title  Pt. will initiate wrist extension in preparation for functional reaching.    Baseline  No active wrist extension. 10/11/2018: inconsistently initiating wrist extension.    Time  12    Period  Weeks    Status  On-going    Target Date  01/03/18            Plan - 12/03/17 1103    Clinical Impression Statement  Pt. reports she has the procedure for the stimulator on Dec. 18th. Pt. is asking why she is not a candidate for surgical repair of the UE tears. Pt. requires multiple cues for redirection., as she has multiple complaints about her LUE.  Pt. continues to present with limited UE ROM, and functioning for improved ADL, and IADL functioning.    Occupational performance deficits (Please refer to evaluation for details):  ADL's;IADL's    Rehab Potential  Good    OT Frequency  2x / week    OT Duration  12 weeks    OT Treatment/Interventions  Self-care/ADL training;Therapeutic exercise;Patient/family education;DME and/or AE instruction;Therapeutic activities    Clinical Decision Making  Several treatment options, min-mod task modification necessary    Consulted and Agree with Plan of Care  Patient       Patient will benefit from skilled therapeutic intervention in order to improve the following deficits and impairments:  Decreased cognition, Difficulty walking, Decreased balance, Decreased strength, Decreased safety awareness, Decreased coordination, Impaired UE functional use, Impaired tone, Decreased range of motion, Decreased endurance, Decreased activity tolerance, Impaired perceived functional ability, Pain  Visit Diagnosis: Muscle weakness (generalized)  Other lack of coordination    Problem List Patient Active Problem List   Diagnosis Date Noted  . Left arm pain 11/05/2017  . Adhesive capsulitis of  left shoulder 08/06/2017  . Urinary frequency 07/11/2017  . Gait disturbance, post-stroke 05/31/2017  . Left spastic hemiparesis ([place]) 05/31/2017  . Adjustment disorder with mixed anxiety and depressed mood   . Hypokalemia   . HCAP (healthcare-associated pneumonia)   . Cough with fever   . 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 FUO (fever of unknown origin)   . Oral thrush   . Benign essential HTN   . Rheumatoid arthritis involving both hands ([place]) 04/28/2017  . Right-sided lacunar infarction 04/27/2017  . CVA (cerebral vascular accident) ([place]) 04/24/2017  . LGSIL Pap smear of vagina 12/15/2016  . Anal skin tag 02/02/2016  . Inflammatory arthritis 01/19/2016  . Right knee pain 01/19/2016  . Genital herpes 10/20/2015  . GERD (gastroesophageal reflux disease) 10/20/2015  . Status post vaginal hysterectomy 10/20/2015  . Health care maintenance 05/30/2015  . LLQ pain 03/31/2014  . Menopausal symptoms 03/31/2014  . Hypercholesterolemia 12/31/2012  . Hypothyroidism 12/29/2012  . Osteoarthritis 12/29/2012  . PULMONARY NODULE 01/02/2008  . Mild depression ([place]) 12/30/2007  . Essential hypertension 12/30/2007    Harrel Carina 12/03/2017, 11:08 AM  [place] MAIN [facility] SERVICES [address] [place], [place], [address] Phone: [phone]   Fax:  [phone]  Name: Donna Rhodes MRN: [id number] Date of Birth: Mar 17, 1946

## 2017-12-03 NOTE — Therapy (Signed)
[place] MAIN [facility] SERVICES [address] [place], [place], [address] Phone: [phone]   Fax:  [phone]  Physical Therapy Treatment  Patient Details  Name: Donna Rhodes MRN: [id number] Date of Birth: 15-Jul-1946 Referring Provider: Alysia Penna   Encounter Date: 12/03/2017  PT End of Session - 12/03/17 1044    Visit Number  21    Number of Visits  41    Date for PT Re-Evaluation  01/18/18    Authorization Type  gcode 1/10    PT Start Time  1054    PT Stop Time  1134    PT Time Calculation (min)  40 min    Equipment Utilized During Treatment  Gait belt    Activity Tolerance  Patient tolerated treatment well;No increased pain    Behavior During Therapy  WFL for tasks assessed/performed       Past Medical History:  Diagnosis Date  . Atrophic vaginitis   . Dysplasia of vagina    vin 1 - vulva and vaginal cuff  . GERD (gastroesophageal reflux disease)   . HCV infection   . HSV (herpes simplex virus) infection   . Hypercholesterolemia   . Hypertension   . Hypothyroidism   . Left-sided weakness    S/P CVA 4/18.  PT 2x/wk.  . Lung nodule    stable  . Menopausal state   . Osteoarthritis    hand involvement, cervical disc disease  . Osteoporosis   . Pelvic pain in female   . Rheumatoid arthritis ([place])   . Stroke (cerebrum) ([place]) 04/24/2017  . Thyroid nodule   . Vaginal Pap smear, abnormal    lgsil    Past Surgical History:  Procedure Laterality Date  . ADENOIDECTOMY    . APPENDECTOMY  1965  . [place]   left  . CATARACT EXTRACTION W/PHACO Right 10/27/2017   Procedure: CATARACT EXTRACTION PHACO AND INTRAOCULAR LENS PLACEMENT ([place]) RIGHT;  Surgeon: Leandrew Koyanagi, MD;  Location: [place];  Service: Ophthalmology;  Laterality: Right;  requests early (8-8:30 arrival)  . CATARACT EXTRACTION W/PHACO Left 11/10/2017   Procedure: CATARACT EXTRACTION PHACO AND INTRAOCULAR LENS PLACEMENT ([place])  LEFT;  Surgeon: Leandrew Koyanagi, MD;  Location: [place];  Service: Ophthalmology;  Laterality: Left;  . CHOLECYSTECTOMY    . [place]  . laser vein surgery    . LOOP RECORDER INSERTION N/A 09/24/2017   Procedure: LOOP RECORDER INSERTION;  Surgeon: Evans Lance, MD;  Location: [place] CV LAB;  Service: Cardiovascular;  Laterality: N/A;  . MANDIBLE RECONSTRUCTION  1992  . TOENAIL EXCISION  1998   several  . [place]  . VAGINAL HYSTERECTOMY  1983   als0- anterior/posterior colporrhaphy  . VAGINAL WOUND CLOSURE / REPAIR  1999, 2000  . VAGINAL WOUND CLOSURE / REPAIR      Vitals:   12/03/17 1056  BP: 128/78  Pulse: 72    Subjective Assessment - 12/03/17 1055    Subjective  Pt reports she completes her UE exercise more than her LE.  Pt completes her LE exercises 3x/wk.  No new compliants or concerns at this time.      Patient is accompained by:  Family member    Pertinent History  Pt is a 71 y.o female that presents to therapy s/p CVA; On April 24, 2017 pt suffered an acute infarct in right basal ganglia. She was in hospital 3 days; after pt was transferred to  [facility] where she did inpatient therapy; Pt then completed home PT until about 2 weeks ago; Pt has pain in shoulder when moving it with R UE and reports that deltoid is paralyzed; Pt fell out of bed one day and fell on L UE; Pt reported doctor put a shot in L shoulder and will receive 3 more shots in shoulder; Pt reports no sensation or numbness and tingling with LE just some sensation impaired in L hand; Pt has an air cast for L LE and splint for wrist but does not use because difficulty donning;  Pt has had trouble sleeping at night with waking and going to the bathroom but denies any pain;  Only has pain in shoulder when moving described as throbbing; Pt reports not using her cane in the last month and states her balance is fine without it;     Limitations  Lifting;Standing;Walking    How  long can you sit comfortably?  hours     How long can you stand comfortably?  30 min     How long can you walk comfortably?  better in the morning because more energy;     Diagnostic tests  x rays in shoulder and hand; all clear; doctor stated "inferior pseudo-subluxation from paralysis of the deltoid. she has advanced left hand arthritis"    Patient Stated Goals  walking better without the cane;     Currently in Pain?  No/denies        TREATMENT   Therapeutic Exercise:  Standing against wall, small green ball against wall, quad set 5 sec hold x10 reps  Seated L knee extension with GTB 2x10 reps  Standing hamstring curls with UE support  Standing in parallel bars: Forward step with LLE using stepping stone as target x20 reps; Pt had difficulty with LLE proprioception/control;  Toe taps with LLE to balance stones to address impaired coordination x2 minutes  Hip hikes LLE with max verbal cues for proper technique  Sit<>stands with airex under RLE for greater LLE muscular recruitment x10  Single LLE leg press 45# 3x10 reps  BLE bridges x10  Single LLE bridge 2x10 with assist to prevent L hip from ER  Hooklying Bil hip ER/Abd with GTB 2x10  LLE coordination exercise in sitting with moving cone as target        PT Education - 12/03/17 1044    Education provided  Yes    Education Details  Exercise technique    Person(s) Educated  Patient    Methods  Explanation;Demonstration;Verbal cues    Comprehension  Verbalized understanding;Returned demonstration;Verbal cues required;Need further instruction          PT Long Term Goals - 11/23/17 1023      PT LONG TERM GOAL #1   Title  Pt will become independent with HEP in order to improve ADL independence;    Baseline  Pt performs several exercises frequently, but inconsistently performs other exercises.     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 on 8/28; improved to 1.25 m/s on 11/23/17    Time  12    Period  Weeks    Status  Achieved    Target Date  01/18/18      PT LONG TERM GOAL #4   Title  Pt will improve 6MWT to >1300 feet in order to be considered 80% of age group norm and to improve activity tolerance and community ambulation distance for improved IADLs (revised from 1000")    Baseline  565 feet, improved to 875' on 8/28, improved to 1105' on 9/19; improved to 1245 on 11/23/17    Time  12    Period  Weeks    Status  Partially Met    Target Date  01/18/18      PT LONG TERM GOAL #5   Title  Pt will increase strength in L LE to 4+/5 for all muscles in order to improve mobility and function with daily activities;     Baseline  pt is 4+/5 in all LE except for L ankle DF: 4/5    Time  12    Period  Weeks    Status  Partially Met    Target Date  01/18/18      PT LONG TERM GOAL #6   Title  Pt will ambulate with equal stride length, no toe drag on LLE, and equal stance and swing time bilaterally to demonstrate improved gait mechanics for more efficient and functional mobility.    Time  12    Period  Weeks    Status  Partially Met    Target Date  01/18/18            Plan - 12/03/17 1118    Clinical Impression Statement  Focused today's session on LLE strenthening as pt demonstrates weakness throughout.  Additionally pt performed LLE coordination exercises.  While ambulating the pt demonstrates decreased  weight shifting to LLE so tactile cues for facilitation provided with weight shifting onto LLE during exercise.  Pt will continue to benefit from skilled PT intervention for improved gait mechanics, strength, and coordination for improved QOL.     Rehab Potential  Fair    Clinical Impairments Affecting Rehab Potential  neg: age, lives alone, comorbidities, pos: wants to be more independent; already high functioning     PT Frequency  2x / week    PT Duration  8 weeks    PT Treatment/Interventions  ADLs/Self Care Home Management;Aquatic Therapy;Moist Heat;Cryotherapy;DME Instruction;Gait training;Stair training;Functional mobility training;Therapeutic activities;Therapeutic exercise;Balance training;Neuromuscular re-education;Manual techniques;Passive range of motion;Energy conservation;Electrical Stimulation;Patient/family education;Orthotic Fit/Training;Dry needling    PT Next Visit Plan  berg; stairs; strengthening, Assess adductor tightness    PT Home Exercise Plan  continue as given;     Consulted and Agree with Plan of Care  Patient       Patient will benefit from skilled therapeutic intervention in order to improve the following deficits and impairments:  Abnormal gait, Impaired sensation, Decreased mobility, Decreased activity tolerance, Decreased endurance, Decreased range of motion, Decreased strength, Impaired UE functional use, Decreased balance, Decreased safety awareness, Difficulty  walking, Pain  Visit Diagnosis: Muscle weakness (generalized)  Other lack of coordination  Other abnormalities of gait and mobility     Problem List Patient Active Problem List   Diagnosis Date Noted  . Left arm pain 11/05/2017  . Adhesive capsulitis of left shoulder 08/06/2017  . Urinary frequency 07/11/2017  . Gait disturbance, post-stroke 05/31/2017  . Left spastic hemiparesis ([place]) 05/31/2017  . Adjustment disorder with mixed anxiety and depressed mood   . Hypokalemia   . HCAP  (healthcare-associated pneumonia)   . Cough with fever   . 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 FUO (fever of unknown origin)   . Oral thrush   . Benign essential HTN   . Rheumatoid arthritis involving both hands ([place]) 04/28/2017  . Right-sided lacunar infarction 04/27/2017  . CVA (cerebral vascular accident) ([place]) 04/24/2017  . LGSIL Pap smear of vagina 12/15/2016  . Anal skin tag 02/02/2016  . Inflammatory arthritis 01/19/2016  . Right knee pain 01/19/2016  . Genital herpes 10/20/2015  . GERD (gastroesophageal reflux disease) 10/20/2015  . Status post vaginal hysterectomy 10/20/2015  . Health care maintenance 05/30/2015  . LLQ pain 03/31/2014  . Menopausal symptoms 03/31/2014  . Hypercholesterolemia 12/31/2012  . Hypothyroidism 12/29/2012  . Osteoarthritis 12/29/2012  . PULMONARY NODULE 01/02/2008  . Mild depression ([place]) 12/30/2007  . Essential hypertension 12/30/2007    Collie Siad PT, DPT 12/03/2017, 11:41 AM  [place] MAIN [facility] SERVICES [address] [place], [place], [address] Phone: [phone]   Fax:  [phone]  Name: Donna Rhodes MRN: [id number] Date of Birth: [birth year]

## 2017-12-07 ENCOUNTER — Ambulatory Visit: Payer: PPO

## 2017-12-07 ENCOUNTER — Encounter: Payer: PPO | Admitting: Occupational Therapy

## 2017-12-09 LAB — CUP PACEART REMOTE DEVICE CHECK
Implantable Pulse Generator Implant Date: 20180928
MDC IDC SESS DTM: 20181127213130

## 2017-12-10 ENCOUNTER — Encounter: Payer: Self-pay | Admitting: Occupational Therapy

## 2017-12-10 ENCOUNTER — Ambulatory Visit: Payer: PPO

## 2017-12-10 ENCOUNTER — Ambulatory Visit: Payer: PPO | Admitting: Occupational Therapy

## 2017-12-10 DIAGNOSIS — M6281 Muscle weakness (generalized): Secondary | ICD-10-CM

## 2017-12-10 DIAGNOSIS — I69352 Hemiplegia and hemiparesis following cerebral infarction affecting left dominant side: Secondary | ICD-10-CM

## 2017-12-10 DIAGNOSIS — R2689 Other abnormalities of gait and mobility: Secondary | ICD-10-CM

## 2017-12-10 DIAGNOSIS — R278 Other lack of coordination: Secondary | ICD-10-CM

## 2017-12-10 NOTE — Therapy (Signed)
[place] MAIN [facility] SERVICES [address] [place], [place], [address] Phone: [phone]   Fax:  [phone]  Occupational Therapy Treatment  Patient Details  Name: Donna Rhodes MRN: [id number] Date of Birth: [birth year] Referring Provider (Historical): Dr. Letta Pate   Encounter Date: 12/10/2017  OT End of Session - 12/10/17 1242    Visit Number  28    Number of Visits  48    Date for OT Re-Evaluation  01/03/18    Authorization Type  Medicare G Code 9 of 10    OT Start Time  1005    OT Stop Time  1045    OT Time Calculation (min)  40 min    Activity Tolerance  Patient tolerated treatment well    Behavior During Therapy  [facility] for tasks assessed/performed       Past Medical History:  Diagnosis Date  . Atrophic vaginitis   . Dysplasia of vagina    vin 1 - vulva and vaginal cuff  . GERD (gastroesophageal reflux disease)   . HCV infection   . HSV (herpes simplex virus) infection   . Hypercholesterolemia   . Hypertension   . Hypothyroidism   . Left-sided weakness    S/P CVA 4/18.  PT 2x/wk.  . Lung nodule    stable  . Menopausal state   . Osteoarthritis    hand involvement, cervical disc disease  . Osteoporosis   . Pelvic pain in female   . Rheumatoid arthritis ([place])   . Stroke (cerebrum) ([place]) 04/24/2017  . Thyroid nodule   . Vaginal Pap smear, abnormal    lgsil    Past Surgical History:  Procedure Laterality Date  . ADENOIDECTOMY    . APPENDECTOMY  1965  . [place]   left  . CATARACT EXTRACTION W/PHACO Right 10/27/2017   Procedure: CATARACT EXTRACTION PHACO AND INTRAOCULAR LENS PLACEMENT ([place]) RIGHT;  Surgeon: Leandrew Koyanagi, MD;  Location: [place];  Service: Ophthalmology;  Laterality: Right;  requests early (8-8:30 arrival)  . CATARACT EXTRACTION W/PHACO Left 11/10/2017   Procedure: CATARACT EXTRACTION PHACO AND INTRAOCULAR LENS PLACEMENT ([place]) LEFT;  Surgeon: Leandrew Koyanagi, MD;   Location: [place];  Service: Ophthalmology;  Laterality: Left;  . CHOLECYSTECTOMY    . [place]  . laser vein surgery    . LOOP RECORDER INSERTION N/A 09/24/2017   Procedure: LOOP RECORDER INSERTION;  Surgeon: Evans Lance, MD;  Location: [place] CV LAB;  Service: Cardiovascular;  Laterality: N/A;  . MANDIBLE RECONSTRUCTION  1992  . TOENAIL EXCISION  1998   several  . [place]  . VAGINAL HYSTERECTOMY  1983   als0- anterior/posterior colporrhaphy  . VAGINAL WOUND CLOSURE / REPAIR  1999, 2000  . VAGINAL WOUND CLOSURE / REPAIR      There were no vitals filed for this visit.  Subjective Assessment - 12/10/17 1240    Subjective   Pt. reports having a follwo-up appointment for bifocals next week.    Patient is accompained by:  Family member    Pertinent History  Pt. is a 71 y.o. female who had a CVA on 04/24/2017. Pt. went to the ER when she started not feeling well, and having heart palpitations. Pt. reports having work up initially, however was sent home. Pt. reports later in the day she was unable to write with her left hand. Pt. returned to the ER, and was admitted with workup for a CVA. Pt.  was admitted to [facility], and stayed for approximately 3 weeks. Pt. returned home alone, and  received home health services for the past 2 months. Pt. now presents for Outpatient OT services. Pt. resides home alone, and has support children who stop by daily, assist with shopping, transportation, and  ADL/IADL tasks as needed.     Patient Stated Goals  To get back the way she was. (Independent)    Currently in Pain?  No/denies    Pain Score  3     Pain Location  Arm    Pain Orientation  Left       OT TREATMENT    Neuro muscular re-education:  Pt. education was provided about proper weightbearing techniques, alternating with facilitation of wrist extension.  Therapeutic Exercise:  Pt. Tolerated gentle PROM for shoulder flexion,  abduction, horizontal abduction limited to 90 degrees, elbow extension. PROM/ AAROM for wrist extension, and thumb movements.                            OT Education - 12/10/17 1241    Education provided  Yes    Education Details  Splint    Person(s) Educated  Patient    Methods  Explanation;Demonstration;Verbal cues    Comprehension  Verbalized understanding;Returned demonstration          OT Long Term Goals - 10/11/17 1219      OT LONG TERM GOAL #1   Title  Pt. will tolerate increased shoulder PROM by 20 degrees with pain improved to under 3/10    Baseline  Flexion: 91, Abduction: 67 10/11/2017: flexion: 108, Abduction: 77    Time  12    Period  Weeks    Status  On-going    Target Date  01/03/18      OT LONG TERM GOAL #2   Title  Pt. will formulate a full fist to be able to stabilize objects with her Left hand while using her right hand.     Baseline  Digit flexion to [facility]: 2nd digit: 4cm, 3rd: 5.5, 4th: 6, 5th: 4.    Time  12    Period  Weeks    Status  On-going    Target Date  01/03/18      OT LONG TERM GOAL #3   Title  Pt. will be able to wipe face independently with her Left UE.    Baseline  Pt. is unable 10/11/2017: hand-over hand assist required    Period  Weeks    Status  On-going    Target Date  01/03/18      OT LONG TERM GOAL #4   Title  Pt. will demonstrate independence with splint application.    Baseline  Pt. is unable to donn splint 10/11/2017: assist required    Time  12    Period  Weeks    Status  On-going    Target Date  01/03/18      OT LONG TERM GOAL #5   Title  Pt. will initiate active grasp release with thumb to 2nd digit 100% of the time to be able to hold a utensil in preparation for self-feeding.    Baseline  Minimal thumb movement. 10/11/2018: Pt. conitnues to have limited thumb motion.    Time  12    Period  Weeks    Status  On-going    Target Date  01/03/18      OT LONG TERM GOAL #  6   Title  Pt. will initiate  wrist extension in preparation for functional reaching.    Baseline  No active wrist extension. 10/11/2018: inconsistently initiating wrist extension.    Time  12    Period  Weeks    Status  On-going    Target Date  01/03/18            Plan - 12/10/17 1243    Clinical Impression Statement  Pt. reports that her daughter is going into rehab in [place] on Tuesday, and her 58 year old mother is returning home tomorrow. Pt. reports she hopes this will help her stress. Pt. reports she has the procedure for the stimulator on Dec. 28th, however, has an eye appointment on the 18th. Pt. Left resting hand splint for nighttime use was reassessed, and reviewed with pt. the proper application. Pt. conitnues to work on improving LUE functioing for improved engagement in ADL tasks.    Occupational Profile and client history currently impacting functional performance  Pt. resides home alone, is retired, and has supprotive family.    Occupational performance deficits (Please refer to evaluation for details):  ADL's;IADL's    Rehab Potential  Good    OT Frequency  2x / week    OT Duration  12 weeks    OT Treatment/Interventions  Self-care/ADL training;Therapeutic exercise;Patient/family education;DME and/or AE instruction;Therapeutic activities    Clinical Decision Making  Several treatment options, min-mod task modification necessary    Consulted and Agree with Plan of Care  Patient       Patient will benefit from skilled therapeutic intervention in order to improve the following deficits and impairments:  Decreased cognition, Difficulty walking, Decreased balance, Decreased strength, Decreased safety awareness, Decreased coordination, Impaired UE functional use, Impaired tone, Decreased range of motion, Decreased endurance, Decreased activity tolerance, Impaired perceived functional ability, Pain  Visit Diagnosis: Muscle weakness (generalized)    Problem List Patient Active Problem List    Diagnosis Date Noted  . Left arm pain 11/05/2017  . Adhesive capsulitis of left shoulder 08/06/2017  . Urinary frequency 07/11/2017  . Gait disturbance, post-stroke 05/31/2017  . Left spastic hemiparesis ([place]) 05/31/2017  . Adjustment disorder with mixed anxiety and depressed mood   . Hypokalemia   . HCAP (healthcare-associated pneumonia)   . Cough with fever   . 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 FUO (fever of unknown origin)   . Oral thrush   . Benign essential HTN   . Rheumatoid arthritis involving both hands ([place]) 04/28/2017  . Right-sided lacunar infarction 04/27/2017  . CVA (cerebral vascular accident) ([place]) 04/24/2017  . LGSIL Pap smear of vagina 12/15/2016  . Anal skin tag 02/02/2016  . Inflammatory arthritis 01/19/2016  . Right knee pain 01/19/2016  . Genital herpes 10/20/2015  . GERD (gastroesophageal reflux disease) 10/20/2015  . Status post vaginal hysterectomy 10/20/2015  . Health care maintenance 05/30/2015  . LLQ pain 03/31/2014  . Menopausal symptoms 03/31/2014  . Hypercholesterolemia 12/31/2012  . Hypothyroidism 12/29/2012  . Osteoarthritis 12/29/2012  . PULMONARY NODULE 01/02/2008  . Mild depression ([place]) 12/30/2007  . Essential hypertension 12/30/2007    Harrel Carina, MS, OTR/L 12/10/2017, 12:52 PM  [place] MAIN [facility] SERVICES [address] [place], [place], [address] Phone: [phone]   Fax:  [phone]  Name: TRIS HOWELL MRN: [id number] Date of Birth: 1946-03-29

## 2017-12-10 NOTE — Therapy (Signed)
[facility] SERVICES [address] [place], [place], [address] Phone: [phone]   Fax:  [phone]  Physical Therapy Treatment  Patient Details  Name: Donna Rhodes MRN: [id number] Date of Birth: 1946-01-14 Referring Provider: Alysia Penna   Encounter Date: 12/10/2017  PT End of Session - 12/10/17 1131    Visit Number  22    Number of Visits  41    Date for PT Re-Evaluation  01/18/18    Authorization Type  gcode 2/10    PT Start Time  1045    PT Stop Time  1130    PT Time Calculation (min)  45 min    Equipment Utilized During Treatment  Gait belt    Activity Tolerance  Patient tolerated treatment well;No increased pain    Behavior During Therapy  WFL for tasks assessed/performed       Past Medical History:  Diagnosis Date  . Atrophic vaginitis   . Dysplasia of vagina    vin 1 - vulva and vaginal cuff  . GERD (gastroesophageal reflux disease)   . HCV infection   . HSV (herpes simplex virus) infection   . Hypercholesterolemia   . Hypertension   . Hypothyroidism   . Left-sided weakness    S/P CVA 4/18.  PT 2x/wk.  . Lung nodule    stable  . Menopausal state   . Osteoarthritis    hand involvement, cervical disc disease  . Osteoporosis   . Pelvic pain in female   . Rheumatoid arthritis ([place])   . Stroke (cerebrum) ([place]) 04/24/2017  . Thyroid nodule   . Vaginal Pap smear, abnormal    lgsil    Past Surgical History:  Procedure Laterality Date  . ADENOIDECTOMY    . APPENDECTOMY  1965  . [place]   left  . CATARACT EXTRACTION W/PHACO Right 10/27/2017   Procedure: CATARACT EXTRACTION PHACO AND INTRAOCULAR LENS PLACEMENT ([place]) RIGHT;  Surgeon: Leandrew Koyanagi, MD;  Location: [place];  Service: Ophthalmology;  Laterality: Right;  requests early (8-8:30 arrival)  . CATARACT EXTRACTION W/PHACO Left 11/10/2017   Procedure: CATARACT EXTRACTION PHACO AND INTRAOCULAR LENS PLACEMENT  ([place]) LEFT;  Surgeon: Leandrew Koyanagi, MD;  Location: [place];  Service: Ophthalmology;  Laterality: Left;  . CHOLECYSTECTOMY    . [place]  . laser vein surgery    . LOOP RECORDER INSERTION N/A 09/24/2017   Procedure: LOOP RECORDER INSERTION;  Surgeon: Evans Lance, MD;  Location: [place] CV LAB;  Service: Cardiovascular;  Laterality: N/A;  . MANDIBLE RECONSTRUCTION  1992  . TOENAIL EXCISION  1998   several  . [place]  . VAGINAL HYSTERECTOMY  1983   als0- anterior/posterior colporrhaphy  . VAGINAL WOUND CLOSURE / REPAIR  1999, 2000  . VAGINAL WOUND CLOSURE / REPAIR      There were no vitals filed for this visit.  Subjective Assessment - 12/10/17 1051    Subjective  Patient reports doing her squats at home every day and going up and down steps. can't do exercises with bands because can't tie them.     Patient is accompained by:  Family member    Pertinent History  Pt is a 71 y.o female that presents to therapy s/p CVA; On April 24, 2017 pt suffered an acute infarct in right basal ganglia. She was in hospital 3 days; after pt was transferred to [facility] where she did inpatient therapy; Pt then  completed home PT until about 2 weeks ago; Pt has pain in shoulder when moving it with R UE and reports that deltoid is paralyzed; Pt fell out of bed one day and fell on L UE; Pt reported doctor put a shot in L shoulder and will receive 3 more shots in shoulder; Pt reports no sensation or numbness and tingling with LE just some sensation impaired in L hand; Pt has an air cast for L LE and splint for wrist but does not use because difficulty donning;  Pt has had trouble sleeping at night with waking and going to the bathroom but denies any pain;  Only has pain in shoulder when moving described as throbbing; Pt reports not using her cane in the last month and states her balance is fine without it;     Limitations  Lifting;Standing;Walking    How long can you  sit comfortably?  hours     How long can you stand comfortably?  30 min     How long can you walk comfortably?  better in the morning because more energy;     Diagnostic tests  x rays in shoulder and hand; all clear; doctor stated "inferior pseudo-subluxation from paralysis of the deltoid. she has advanced left hand arthritis"    Patient Stated Goals  walking better without the cane;     Currently in Pain?  No/denies       slight ataxic pattern with increased LLE knee flexion at initial contact with flat foot at initial contact exhibiting decreased foot clearance.   TherEx  Weight shift to L side with green pad under R leg, standing without UE support 2 minutes   Weight shift to L side with soccer ball under R leg no UE support 2 minutes  Seated adduction squeezes 10x 5 second holds with cues for relaxing and contracting.  Stand on 6" step and step down and back up with LLE and RLE (side step eccentric heel touch step downs) 15x each leg .   Seated LAQ with ball squeeze between feet 10x ball between legs 1 inch below knees 10x    Seated foot eversion 2x 15x with YTB around toes   Seated df with YTB around toes 15x  Neuro RE-ed Airex pad: horizontal head turns 10x, vertical head turns 10x   Toe taps with cones with PT directing which color and leg. Poor coordination of anterior/midline with L leg.   Step over orange hurdle with SUE support with LLE. Require cueing and focus on avoiding ER.   Stand on half foam roller 2 minutes no UE support, occasional Posterior LOB.    Pt. response to medical necessity:  Patient will continue to benefit from skilled PT to improve gait mechanics, strength, and coordination for improved QOL.                      PT Education - 12/10/17 1130    Education provided  Yes    Education Details  purse lipped breathing for muscle relaxation to decrease contraction with stress    Person(s) Educated  Patient    Methods   Explanation;Demonstration;Verbal cues    Comprehension  Verbalized understanding;Returned demonstration          PT Long Term Goals - 11/23/17 1023      PT LONG TERM GOAL #1   Title  Pt will become independent with HEP in order to improve ADL independence;    Baseline  Pt performs several  exercises frequently, but inconsistently performs other exercises.     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 on 8/28; improved to 1.25 m/s on 11/23/17    Time  12    Period  Weeks    Status  Achieved    Target Date  01/18/18      PT LONG TERM GOAL #4   Title  Pt will improve 6MWT to >1300 feet in order to be considered 80% of age group norm and to improve activity tolerance and community ambulation distance for improved IADLs (revised from 1000")    Baseline  565 feet, improved to 875' on 8/28, improved to 1105' on 9/19; improved to 1245 on 11/23/17    Time  12    Period  Weeks    Status  Partially Met    Target Date  01/18/18      PT LONG TERM GOAL #5   Title  Pt will increase strength in L LE to 4+/5 for all muscles in order to improve mobility and function with daily activities;     Baseline  pt is 4+/5 in all LE except for L ankle DF: 4/5    Time  12    Period  Weeks    Status  Partially Met    Target Date  01/18/18      PT LONG TERM GOAL #6   Title  Pt will ambulate with equal stride length, no  toe drag on LLE, and equal stance and swing time bilaterally to demonstrate improved gait mechanics for more efficient and functional mobility.    Time  12    Period  Weeks    Status  Partially Met    Target Date  01/18/18            Plan - 12/10/17 1132    Clinical Impression Statement  Poor coordination of L foot for midline and medial tapping of cones due to poor middle quadriceps and adductor strength. Educated on purse lipped breathing to decrease muscle contraction of UE and LE's when stressed/focusing to allow for decreased compensatory pattern of movement. Patient will continue to benefit from skilled PT to improve gait mechanics, strength, and coordination for improved QOL.     Rehab Potential  Fair    Clinical Impairments Affecting Rehab Potential  neg: age, lives alone, comorbidities, pos: wants to be more independent; already high functioning     PT Frequency  2x / week    PT Duration  8 weeks    PT Treatment/Interventions  ADLs/Self Care Home Management;Aquatic Therapy;Moist Heat;Cryotherapy;DME Instruction;Gait training;Stair training;Functional mobility training;Therapeutic activities;Therapeutic  exercise;Balance training;Neuromuscular re-education;Manual techniques;Passive range of motion;Energy conservation;Electrical Stimulation;Patient/family education;Orthotic Fit/Training;Dry needling    PT Next Visit Plan  berg; stairs; strengthening, Assess adductor tightness    PT Home Exercise Plan  continue as given;     Consulted and Agree with Plan of Care  Patient       Patient will benefit from skilled therapeutic intervention in order to improve the following deficits and impairments:  Abnormal gait, Impaired sensation, Decreased mobility, Decreased activity tolerance, Decreased endurance, Decreased range of motion, Decreased strength, Impaired UE functional use, Decreased balance, Decreased safety awareness, Difficulty walking, Pain  Visit Diagnosis: Muscle weakness  (generalized)  Other lack of coordination  Other abnormalities of gait and mobility  Hemiplegia and hemiparesis following cerebral infarction affecting left dominant side [facility])     Problem List Patient Active Problem List   Diagnosis Date Noted  . Left arm pain 11/05/2017  . Adhesive capsulitis of left shoulder 08/06/2017  . Urinary frequency 07/11/2017  . Gait disturbance, post-stroke 05/31/2017  . Left spastic hemiparesis ([place]) 05/31/2017  . Adjustment disorder with mixed anxiety and depressed mood   . Hypokalemia   . HCAP (healthcare-associated pneumonia)   . Cough with fever   . Flaccid monoplegia of upper extremity ([place])   . FUO (fever of unknown origin)   . Oral thrush   . Benign essential HTN   . Rheumatoid arthritis involving both hands ([place]) 04/28/2017  . Right-sided lacunar infarction 04/27/2017  . CVA (cerebral vascular accident) ([place]) 04/24/2017  . LGSIL Pap smear of vagina 12/15/2016  . Anal skin tag 02/02/2016  . Inflammatory arthritis 01/19/2016  . Right knee pain 01/19/2016  . Genital herpes 10/20/2015  . GERD (gastroesophageal reflux disease) 10/20/2015  . Status post vaginal hysterectomy 10/20/2015  . Health care maintenance 05/30/2015  . LLQ pain 03/31/2014  . Menopausal symptoms 03/31/2014  . Hypercholesterolemia 12/31/2012  . Hypothyroidism 12/29/2012  . Osteoarthritis 12/29/2012  . PULMONARY NODULE 01/02/2008  . Mild depression ([place]) 12/30/2007  . Essential hypertension 12/30/2007   Janna Arch, PT, DPT   Janna Arch 12/10/2017, 11:33 AM  [place] [facility] SERVICES [address] [place], [place], [address] Phone: [phone]   Fax:  [phone]  Name: DIM MEISINGER MRN: [id number] Date of Birth: 09-Sep-1946

## 2017-12-13 ENCOUNTER — Other Ambulatory Visit (INDEPENDENT_AMBULATORY_CARE_PROVIDER_SITE_OTHER): Payer: PPO

## 2017-12-13 DIAGNOSIS — I10 Essential (primary) hypertension: Secondary | ICD-10-CM

## 2017-12-13 DIAGNOSIS — E7800 Pure hypercholesterolemia, unspecified: Secondary | ICD-10-CM | POA: Diagnosis not present

## 2017-12-13 DIAGNOSIS — E039 Hypothyroidism, unspecified: Secondary | ICD-10-CM | POA: Diagnosis not present

## 2017-12-13 LAB — LIPID PANEL
CHOL/HDL RATIO: 2
CHOLESTEROL: 149 mg/dL (ref 0–200)
HDL: 62.4 mg/dL (ref >39.00)
LDL CALC: 70 mg/dL (ref 0–99)
NonHDL: 87.08
Triglycerides: 87 mg/dL (ref 0.0–149.0)
VLDL: 17.4 mg/dL (ref 0.0–40.0)

## 2017-12-13 LAB — BASIC METABOLIC PANEL WITH GFR
BUN: 16 mg/dL (ref 6–23)
CO2: 30 meq/L (ref 19–32)
Calcium: 9.5 mg/dL (ref 8.4–10.5)
Chloride: 105 meq/L (ref 96–112)
Creatinine, Ser: 0.75 mg/dL (ref 0.40–1.20)
GFR: 80.71 mL/min
Glucose, Bld: 83 mg/dL (ref 70–99)
Potassium: 4.5 meq/L (ref 3.5–5.1)
Sodium: 142 meq/L (ref 135–145)

## 2017-12-13 LAB — HEPATIC FUNCTION PANEL
ALT: 23 U/L (ref 0–35)
AST: 25 U/L (ref 0–37)
Albumin: 4.2 g/dL (ref 3.5–5.2)
Alkaline Phosphatase: 75 U/L (ref 39–117)
BILIRUBIN DIRECT: 0.1 mg/dL (ref 0.0–0.3)
BILIRUBIN TOTAL: 0.6 mg/dL (ref 0.2–1.2)
Total Protein: 7.1 g/dL (ref 6.0–8.3)

## 2017-12-13 LAB — TSH: TSH: 3.09 u[IU]/mL (ref 0.35–4.50)

## 2017-12-14 ENCOUNTER — Ambulatory Visit: Payer: PPO

## 2017-12-15 ENCOUNTER — Encounter: Payer: Self-pay | Admitting: *Deleted

## 2017-12-15 ENCOUNTER — Ambulatory Visit: Payer: PPO | Admitting: Diagnostic Neuroimaging

## 2017-12-17 ENCOUNTER — Ambulatory Visit: Payer: PPO

## 2017-12-17 ENCOUNTER — Ambulatory Visit: Payer: PPO | Admitting: Internal Medicine

## 2017-12-17 ENCOUNTER — Ambulatory Visit: Payer: PPO | Admitting: Occupational Therapy

## 2017-12-17 DIAGNOSIS — M6281 Muscle weakness (generalized): Secondary | ICD-10-CM

## 2017-12-17 DIAGNOSIS — R278 Other lack of coordination: Secondary | ICD-10-CM

## 2017-12-17 DIAGNOSIS — I69352 Hemiplegia and hemiparesis following cerebral infarction affecting left dominant side: Secondary | ICD-10-CM

## 2017-12-17 DIAGNOSIS — R2689 Other abnormalities of gait and mobility: Secondary | ICD-10-CM

## 2017-12-17 NOTE — Therapy (Signed)
[place] MAIN [facility] SERVICES [address] [place], [place], [address] Phone: [phone]   Fax:  [phone]  Physical Therapy Treatment  Patient Details  Name: Donna Rhodes MRN: [id number] Date of Birth: 1946/02/22 Referring Provider: Alysia Penna   Encounter Date: 12/17/2017  Donna Rhodes End of Session - 12/17/17 1129    Visit Number  23    Number of Visits  41    Date for Donna Rhodes Re-Evaluation  01/18/18    Authorization Type  gcode 3/10    Donna Rhodes Start Time  1050    Donna Rhodes Stop Time  1130    Donna Rhodes Time Calculation (min)  40 min    Equipment Utilized During Treatment  Gait belt    Activity Tolerance  Patient tolerated treatment well;No increased pain    Behavior During Therapy  WFL for tasks assessed/performed       Past Medical History:  Diagnosis Date  . Atrophic vaginitis   . Dysplasia of vagina    vin 1 - vulva and vaginal cuff  . GERD (gastroesophageal reflux disease)   . HCV infection   . HSV (herpes simplex virus) infection   . Hypercholesterolemia   . Hypertension   . Hypothyroidism   . Left-sided weakness    S/P CVA 4/18.  Donna Rhodes 2x/wk.  . Lung nodule    stable  . Menopausal state   . Osteoarthritis    hand involvement, cervical disc disease  . Osteoporosis   . Pelvic pain in female   . Rheumatoid arthritis ([place])   . Stroke (cerebrum) ([place]) 04/24/2017  . Thyroid nodule   . Vaginal Pap smear, abnormal    lgsil    Past Surgical History:  Procedure Laterality Date  . ADENOIDECTOMY    . APPENDECTOMY  1965  . [place]   left  . CATARACT EXTRACTION W/PHACO Right 10/27/2017   Procedure: CATARACT EXTRACTION PHACO AND INTRAOCULAR LENS PLACEMENT ([place]) RIGHT;  Surgeon: Leandrew Koyanagi, MD;  Location: [place];  Service: Ophthalmology;  Laterality: Right;  requests early (8-8:30 arrival)  . CATARACT EXTRACTION W/PHACO Left 11/10/2017   Procedure: CATARACT EXTRACTION PHACO AND INTRAOCULAR LENS PLACEMENT  ([place]) LEFT;  Surgeon: Leandrew Koyanagi, MD;  Location: [place];  Service: Ophthalmology;  Laterality: Left;  . CHOLECYSTECTOMY    . [place]  . laser vein surgery    . LOOP RECORDER INSERTION N/A 09/24/2017   Procedure: LOOP RECORDER INSERTION;  Surgeon: Evans Lance, MD;  Location: [place] CV LAB;  Service: Cardiovascular;  Laterality: N/A;  . MANDIBLE RECONSTRUCTION  1992  . TOENAIL EXCISION  1998   several  . [place]  . VAGINAL HYSTERECTOMY  1983   als0- anterior/posterior colporrhaphy  . VAGINAL WOUND CLOSURE / REPAIR  1999, 2000  . VAGINAL WOUND CLOSURE / REPAIR      There were no vitals filed for this visit.  Subjective Assessment - 12/17/17 1053    Subjective  Patient reports not doing her HEP. Been going up and down steps though. Has had some person problems this week limiting time for self.  has noted tight L IT band    Patient is accompained by:  Family member    Pertinent History  Donna Rhodes is a 71 y.o female that presents to therapy s/p CVA; On April 24, 2017 Donna Rhodes suffered an acute infarct in right basal ganglia. She was in hospital 3 days; after Donna Rhodes was transferred to [facility] where  she did inpatient therapy; Donna Rhodes then completed home Donna Rhodes until about 2 weeks ago; Donna Rhodes has pain in shoulder when moving it with R UE and reports that deltoid is paralyzed; Donna Rhodes fell out of bed one day and fell on L UE; Donna Rhodes reported doctor put a shot in L shoulder and will receive 3 more shots in shoulder; Donna Rhodes reports no sensation or numbness and tingling with LE just some sensation impaired in L hand; Donna Rhodes has an air cast for L LE and splint for wrist but does not use because difficulty donning;  Donna Rhodes has had trouble sleeping at night with waking and going to the bathroom but denies any pain;  Only has pain in shoulder when moving described as throbbing; Donna Rhodes reports not using her cane in the last month and states her balance is fine without it;     Limitations   Lifting;Standing;Walking    How long can you sit comfortably?  hours     How long can you stand comfortably?  30 min     How long can you walk comfortably?  better in the morning because more energy;     Diagnostic tests  x rays in shoulder and hand; all clear; doctor stated "inferior pseudo-subluxation from paralysis of the deltoid. she has advanced left hand arthritis"    Patient Stated Goals  walking better without the cane;     Currently in Pain?  Yes    Pain Score  6     Pain Location  Leg    Pain Orientation  Left    Pain Descriptors / Indicators  Aching    Pain Type  Chronic pain    Pain Onset  More than a month ago    Pain Frequency  Intermittent       Standing IT band stretch  Seated IT band stretch    IT band rollout with tennis ball: educated patient, added to HEP   Obstacle course: walking around 4 cones and over half foam rollers. occasional tripping over self.   Obstacle course: zig zag around 4 cones step over two half foam rollers; no LOB, no tripping  pick up 4 balls of top of cones via squat : 2x, cues for shoulder width apart, increased knee bend decreased trunk flexion. No LOB.   Reading cards in hallway : decreased step length on LLE. Decreased hip swing, two LOB to left, self righting.   Step over hurdle then up 4 inch step in //bars 10x. SUE support no LOB, occasional circumduction of LLE.   Seated adduction and knee extension squeezing ball between ankles 15x    toy soldier // bars 2x.    Donna Rhodes. response to medical necessity:  Patient will continue to benefit from skilled physical therapy to improve gait mechanics, strength, and coordination for improved QOL.              Donna Rhodes Education - 12/17/17 1121    Education provided  Yes    Education Details  strengthening and balance, stepping over sequencinging, IT band stretch     Person(s) Educated  Patient    Methods  Explanation;Demonstration;Verbal cues    Comprehension  Verbalized  understanding;Returned demonstration          Donna Rhodes Long Term Goals - 11/23/17 1023      Donna Rhodes LONG TERM GOAL #1   Title  Donna Rhodes will become independent with HEP in order to improve ADL independence;    Baseline  Donna Rhodes performs several exercises frequently, but  inconsistently performs other exercises.     Time  12    Period  Weeks    Status  On-going    Target Date  01/18/18      Donna Rhodes LONG TERM GOAL #2   Title  Donna Rhodes will decrease 5 times sit to stand to less than 15 sec with no UE use to decrease risk of fall;    Baseline  18 sec, improve to 11.65 sec indicating Donna Rhodes is low fall risk for age > 92 on 8/28    Time  8    Period  Weeks    Status  Achieved    Target Date  01/18/18      Donna Rhodes LONG TERM GOAL #3   Title  Donna Rhodes will improve gait speed in 10MWT to 1.68ms in order to show meaningful improvement and to be considered a comunity ambulator    Baseline  .546m improved to 1.64m764mindicating 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 on 8/28; improved to 1.25 m/s on 11/23/17    Time  12    Period  Weeks    Status  Achieved    Target Date  01/18/18      Donna Rhodes LONG TERM GOAL #4   Title  Donna Rhodes will improve 6MWT to >1300 feet in order to be considered 80% of age group norm and to improve activity tolerance and community ambulation distance for improved IADLs (revised from 1000")    Baseline  565 feet, improved to 875' on 8/28, improved to 1105' on 9/19; improved to 1245 on 11/23/17    Time  12    Period  Weeks    Status  Partially Met    Target Date  01/18/18      Donna Rhodes LONG TERM GOAL #5   Title  Donna Rhodes will increase strength in L LE to 4+/5 for all muscles in order to improve mobility and function with daily activities;     Baseline  Donna Rhodes is 4+/5 in all LE except for L ankle DF: 4/5    Time  12    Period  Weeks    Status  Partially Met    Target Date  01/18/18      Donna Rhodes LONG TERM GOAL #6   Title  Donna Rhodes will ambulate with equal stride length, no toe drag on LLE, and equal stance and swing time bilaterally to  demonstrate improved gait mechanics for more efficient and functional mobility.    Time  12    Period  Weeks    Status  Partially Met    Target Date  01/18/18            Plan - 12/17/17 1132    Clinical Impression Statement  Patient progressing with functional strength and mobility with decreased episodes of LOB. Patient occasionally tripped over self due to decreased awareness of LLE and fatigue. Increased pattern foot supination with fatigue noted. Good body mechanics for squats to pick up objects performed with no LOB. Patient will continue to benefit from skilled physical therapy to improve gait mechanics, strength, and coordination for improved QOL.     Rehab Potential  Fair    Clinical Impairments Affecting Rehab Potential  neg: age, lives alone, comorbidities, pos: wants to be more independent; already high functioning     Donna Rhodes Frequency  2x / week    Donna Rhodes Duration  8 weeks    Donna Rhodes Treatment/Interventions  ADLs/Self Care Home Management;Aquatic Therapy;Moist Heat;Cryotherapy;DME Instruction;Gait training;Stair training;Functional mobility training;Therapeutic activities;Therapeutic  exercise;Balance training;Neuromuscular re-education;Manual techniques;Passive range of motion;Energy conservation;Electrical Stimulation;Patient/family education;Orthotic Fit/Training;Dry needling    Donna Rhodes Next Visit Plan  berg; stairs; strengthening, Assess adductor tightness    Donna Rhodes Home Exercise Plan  continue as given;     Consulted and Agree with Plan of Care  Patient       Patient will benefit from skilled therapeutic intervention in order to improve the following deficits and impairments:  Abnormal gait, Impaired sensation, Decreased mobility, Decreased activity tolerance, Decreased endurance, Decreased range of motion, Decreased strength, Impaired UE functional use, Decreased balance, Decreased safety awareness, Difficulty walking, Pain  Visit Diagnosis: Muscle weakness (generalized)  Other lack of  coordination  Other abnormalities of gait and mobility  Hemiplegia and hemiparesis following cerebral infarction affecting left dominant side [facility])     Problem List Patient Active Problem List   Diagnosis Date Noted  . Left arm pain 11/05/2017  . Adhesive capsulitis of left shoulder 08/06/2017  . Urinary frequency 07/11/2017  . Gait disturbance, post-stroke 05/31/2017  . Left spastic hemiparesis ([place]) 05/31/2017  . Adjustment disorder with mixed anxiety and depressed mood   . Hypokalemia   . HCAP (healthcare-associated pneumonia)   . Cough with fever   . 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 FUO (fever of unknown origin)   . Oral thrush   . Benign essential HTN   . Rheumatoid arthritis involving both hands ([place]) 04/28/2017  . Right-sided lacunar infarction 04/27/2017  . CVA (cerebral vascular accident) ([place]) 04/24/2017  . LGSIL Pap smear of vagina 12/15/2016  . Anal skin tag 02/02/2016  . Inflammatory arthritis 01/19/2016  . Right knee pain 01/19/2016  . Genital herpes 10/20/2015  . GERD (gastroesophageal reflux disease) 10/20/2015  . Status post vaginal hysterectomy 10/20/2015  . Health care maintenance 05/30/2015  . LLQ pain 03/31/2014  . Menopausal symptoms 03/31/2014  . Hypercholesterolemia 12/31/2012  . Hypothyroidism 12/29/2012  . Osteoarthritis 12/29/2012  . PULMONARY NODULE 01/02/2008  . Mild depression ([place]) 12/30/2007  . Essential hypertension 12/30/2007   Janna Arch, Donna Rhodes, DPT   Janna Arch 12/17/2017, 11:33 AM  [place] MAIN [facility] SERVICES [address] [place], [place], [address] Phone: [phone]   Fax:  [phone]  Name: SAMARIE PINDER MRN: [id number] Date of Birth: Feb 10, 1946

## 2017-12-17 NOTE — Therapy (Signed)
[place] MAIN [facility] SERVICES [address] [place], [place], [address] Phone: [phone]   Fax:  [phone]  Occupational Therapy Treatment/G-Code Note  Patient Details  Name: Donna Rhodes MRN: [id number] Date of Birth: 08/20/1946 Referring Provider (Historical): Dr. Letta Pate   Encounter Date: 12/17/2017  OT End of Session - 12/17/17 1054    Visit Number  29    Number of Visits  41    Date for OT Re-Evaluation  01/03/18    Authorization Type  Medicare G Code 10 of 10    OT Start Time  1010    OT Stop Time  1045    OT Time Calculation (min)  35 min    Activity Tolerance  Patient tolerated treatment well    Behavior During Therapy  [facility] for tasks assessed/performed       Past Medical History:  Diagnosis Date  . Atrophic vaginitis   . Dysplasia of vagina    vin 1 - vulva and vaginal cuff  . GERD (gastroesophageal reflux disease)   . HCV infection   . HSV (herpes simplex virus) infection   . Hypercholesterolemia   . Hypertension   . Hypothyroidism   . Left-sided weakness    S/P CVA 4/18.  PT 2x/wk.  . Lung nodule    stable  . Menopausal state   . Osteoarthritis    hand involvement, cervical disc disease  . Osteoporosis   . Pelvic pain in female   . Rheumatoid arthritis ([place])   . Stroke (cerebrum) ([place]) 04/24/2017  . Thyroid nodule   . Vaginal Pap smear, abnormal    lgsil    Past Surgical History:  Procedure Laterality Date  . ADENOIDECTOMY    . APPENDECTOMY  1965  . [place]   left  . CATARACT EXTRACTION W/PHACO Right 10/27/2017   Procedure: CATARACT EXTRACTION PHACO AND INTRAOCULAR LENS PLACEMENT ([place]) RIGHT;  Surgeon: Leandrew Koyanagi, MD;  Location: [place];  Service: Ophthalmology;  Laterality: Right;  requests early (8-8:30 arrival)  . CATARACT EXTRACTION W/PHACO Left 11/10/2017   Procedure: CATARACT EXTRACTION PHACO AND INTRAOCULAR LENS PLACEMENT ([place]) LEFT;  Surgeon: Leandrew Koyanagi, MD;  Location: [place];  Service: Ophthalmology;  Laterality: Left;  . CHOLECYSTECTOMY    . [place]  . laser vein surgery    . LOOP RECORDER INSERTION N/A 09/24/2017   Procedure: LOOP RECORDER INSERTION;  Surgeon: Evans Lance, MD;  Location: [place] CV LAB;  Service: Cardiovascular;  Laterality: N/A;  . MANDIBLE RECONSTRUCTION  1992  . TOENAIL EXCISION  1998   several  . [place]  . VAGINAL HYSTERECTOMY  1983   als0- anterior/posterior colporrhaphy  . VAGINAL WOUND CLOSURE / REPAIR  1999, 2000  . VAGINAL WOUND CLOSURE / REPAIR      There were no vitals filed for this visit.  Subjective Assessment - 12/17/17 1053    Subjective   Pt. was late for the session    Patient is accompained by:  Family member    Pertinent History  Pt. is a 71 y.o. female who had a CVA on 04/24/2017. Pt. went to the ER when she started not feeling well, and having heart palpitations. Pt. reports having work up initially, however was sent home. Pt. reports later in the day she was unable to write with her left hand. Pt. returned to the ER, and was admitted with workup for a CVA. Pt. was admitted to  [facility], and stayed for approximately 3 weeks. Pt. returned home alone, and  received home health services for the past 2 months. Pt. now presents for Outpatient OT services. Pt. resides home alone, and has support children who stop by daily, assist with shopping, transportation, and  ADL/IADL tasks as needed.     Currently in Pain?  No/denies       OT TREATMENT    Goals were reviewed.  Orthotic Fitting:  Assessed the fit of the [employer] for her LUE. Harness was adjusted and fit to pt. Pt. presented with redness, and irritation and the Left thumb IP joint , and MP joint, and 2nd digit MP joint. secondary to the hard hand insert, and arthritis in the hand. It is not recommeneded that pt. continue wearing the brace with the current hand  insert. Pt. was advised not to wear the brace with the hand piece in place as is. Pt. reports she may remove, the hard plastic insert, or that she may return the brace. Pt. was concerned that if she removes the hard hand insert, she will not be able to return it . Pt. education was provided about proper brace application, fit, and care. Pt. will require additional training for application, and adjustment to hand piece if pt. Decides to keep it.                        OT Education - 12/17/17 1053    Education provided  Yes    Education Details  [employer] brace    Person(s) Educated  Patient    Methods  Explanation;Demonstration;Verbal cues    Comprehension  Verbalized understanding;Returned demonstration          OT Long Term Goals - 12/17/17 1106      OT LONG TERM GOAL #1   Title  Pt. will tolerate  shoulder PROM with pain improved to under 3/10    Baseline  Limited by pain    Time  12    Period  Weeks    Status  Revised    Target Date  01/03/18      OT LONG TERM GOAL #2   Title  Pt. will formulate a full fist to be able to stabilize objects with her Left hand while using her right hand.     Baseline  limited    Time  12    Period  Weeks    Status  On-going    Target Date  01/03/18      OT LONG TERM GOAL #3   Title  Pt. will be able to wipe face independently with her Left UE.    Baseline  Pt. is unable 10/11/2017: hand-over hand assist required    Time  12    Period  Weeks    Status  On-going    Target Date  01/03/18      OT LONG TERM GOAL #4   Title  Pt. will demonstrate independence with splint application.    Baseline  Pt. is unable to donn splint 10/11/2017: assist required    Time  12    Period  Weeks    Status  On-going    Target Date  01/03/18      OT LONG TERM GOAL #5   Title  Pt. will initiate active grasp release with thumb to 2nd digit 100% of the time to be able to hold a utensil in preparation for self-feeding.  Baseline  Minimal  thumb movement. 10/11/2018: Pt. conitnues to have limited thumb motion.    Time  12    Period  Weeks    Status  On-going    Target Date  01/03/18      OT LONG TERM GOAL #6   Title  Pt. will initiate wrist extension in preparation for functional reaching.    Baseline  No active wrist extension. 10/11/2018: inconsistently initiating wrist extension.    Time  12    Period  Weeks    Status  On-going    Target Date  01/03/18            Plan - Jan 06, 2018 1054    Clinical Impression Statement  Pt. reports her insurance denied the procedure that is is preparing to have next week at Dr. Dianna Limbo office. Assessed the fit of the [employer] for her LUE. Harness was adjusted and fit to pt. Pt. presented with redness, and irritation and the Left thumb IP joint , and MP joint, and 2nd digit MP joint. secondary to the hard hand insert, and arthritis in the hand. It is not recommeneded that pt. continue wearing the brace with the current hand insert. Pt. was advised not to wear the brace with the hand piece in place as is. Pt. reports she may remove, the hard plastic insert, or that she may return the brace. Pt. was concerned that if she removes the hard hand insert, she will not be able to return it . Pt. education was provided about proper brace application, however will need additional practice in donning, and education about proper fit to reinforce learning. Conitnue with current POC. Goals were reviewed,     Occupational Profile and client history currently impacting functional performance  Pt. resides home alone, is retired, and has supprotive family.    Occupational performance deficits (Please refer to evaluation for details):  ADL's;IADL's    Rehab Potential  Good    OT Frequency  2x / week    OT Duration  12 weeks    OT Treatment/Interventions  Self-care/ADL training;Therapeutic exercise;Patient/family education;DME and/or AE instruction;Therapeutic activities    Clinical Decision Making   Several treatment options, min-mod task modification necessary    Consulted and Agree with Plan of Care  Patient       Patient will benefit from skilled therapeutic intervention in order to improve the following deficits and impairments:  Decreased cognition, Difficulty walking, Decreased balance, Decreased strength, Decreased safety awareness, Decreased coordination, Impaired UE functional use, Impaired tone, Decreased range of motion, Decreased endurance, Decreased activity tolerance, Impaired perceived functional ability, Pain  Visit Diagnosis: Muscle weakness (generalized)  G-Codes - 2018-01-06 1110    Functional Assessment Tool Used (Outpatient only)  clinical judgement based on pt. current functional level, MMT, ROM    Functional Limitation  Carrying, moving and handling objects    Carrying, Moving and Handling Objects Current Status (W4665)  At least 80 percent but less than 100 percent impaired, limited or restricted    Carrying, Moving and Handling Objects Goal Status (L9357)  At least 40 percent but less than 60 percent impaired, limited or restricted       Problem List Patient Active Problem List   Diagnosis Date Noted  . Left arm pain 11/05/2017  . Adhesive capsulitis of left shoulder 08/06/2017  . Urinary frequency 07/11/2017  . Gait disturbance, post-stroke 05/31/2017  . Left spastic hemiparesis ([place]) 05/31/2017  . Adjustment disorder with mixed anxiety and depressed mood   .  Hypokalemia   . HCAP (healthcare-associated pneumonia)   . Cough with fever   . 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 FUO (fever of unknown origin)   . Oral thrush   . Benign essential HTN   . Rheumatoid arthritis involving both hands ([place]) 04/28/2017  . Right-sided lacunar infarction 04/27/2017  . CVA (cerebral vascular accident) ([place]) 04/24/2017  . LGSIL Pap smear of vagina 12/15/2016  . Anal skin tag 02/02/2016  . Inflammatory arthritis 01/19/2016  . Right knee pain 01/19/2016   . Genital herpes 10/20/2015  . GERD (gastroesophageal reflux disease) 10/20/2015  . Status post vaginal hysterectomy 10/20/2015  . Health care maintenance 05/30/2015  . LLQ pain 03/31/2014  . Menopausal symptoms 03/31/2014  . Hypercholesterolemia 12/31/2012  . Hypothyroidism 12/29/2012  . Osteoarthritis 12/29/2012  . PULMONARY NODULE 01/02/2008  . Mild depression ([place]) 12/30/2007  . Essential hypertension 12/30/2007    Harrel Carina, MS, OTR/L 12/17/2017, 11:11 AM  [place] MAIN [facility] SERVICES [address] [place], [place], [address] Phone: [phone]   Fax:  [phone]  Name: Donna Rhodes MRN: [id number] Date of Birth: 1946/03/23

## 2017-12-22 ENCOUNTER — Ambulatory Visit: Payer: PPO

## 2017-12-23 ENCOUNTER — Ambulatory Visit (INDEPENDENT_AMBULATORY_CARE_PROVIDER_SITE_OTHER): Payer: PPO | Admitting: *Deleted

## 2017-12-23 DIAGNOSIS — I639 Cerebral infarction, unspecified: Secondary | ICD-10-CM

## 2017-12-23 NOTE — Progress Notes (Signed)
Carelink Summary Report / Loop Recorder 

## 2017-12-24 ENCOUNTER — Ambulatory Visit: Payer: PPO | Admitting: Occupational Therapy

## 2017-12-24 ENCOUNTER — Encounter: Payer: Self-pay | Admitting: Occupational Therapy

## 2017-12-24 ENCOUNTER — Ambulatory Visit: Payer: PPO | Admitting: Physical Medicine & Rehabilitation

## 2017-12-24 ENCOUNTER — Ambulatory Visit: Payer: PPO

## 2017-12-24 DIAGNOSIS — R2689 Other abnormalities of gait and mobility: Secondary | ICD-10-CM

## 2017-12-24 DIAGNOSIS — M6281 Muscle weakness (generalized): Secondary | ICD-10-CM | POA: Diagnosis not present

## 2017-12-24 DIAGNOSIS — R278 Other lack of coordination: Secondary | ICD-10-CM

## 2017-12-24 DIAGNOSIS — I69352 Hemiplegia and hemiparesis following cerebral infarction affecting left dominant side: Secondary | ICD-10-CM

## 2017-12-24 NOTE — Therapy (Signed)
[place] MAIN [facility] SERVICES [address] [place], [place], [address] Phone: [phone]   Fax:  [phone]  Physical Therapy Treatment  Patient Details  Name: Donna Rhodes MRN: [id number] Date of Birth: [birth year] Referring Provider: Alysia Penna   Encounter Date: 12/24/2017  PT End of Session - 12/24/17 1035    Visit Number  23    Number of Visits  41    Date for PT Re-Evaluation  01/18/18    Authorization Type  gcode 4 /10    PT Start Time  1001    PT Stop Time  1045    PT Time Calculation (min)  44 min    Equipment Utilized During Treatment  Gait belt    Activity Tolerance  Patient tolerated treatment well;No increased pain    Behavior During Therapy  WFL for tasks assessed/performed       Past Medical History:  Diagnosis Date  . Atrophic vaginitis   . Dysplasia of vagina    vin 1 - vulva and vaginal cuff  . GERD (gastroesophageal reflux disease)   . HCV infection   . HSV (herpes simplex virus) infection   . Hypercholesterolemia   . Hypertension   . Hypothyroidism   . Left-sided weakness    S/P CVA 4/18.  PT 2x/wk.  . Lung nodule    stable  . Menopausal state   . Osteoarthritis    hand involvement, cervical disc disease  . Osteoporosis   . Pelvic pain in female   . Rheumatoid arthritis ([place])   . Stroke (cerebrum) ([place]) 04/24/2017  . Thyroid nodule   . Vaginal Pap smear, abnormal    lgsil    Past Surgical History:  Procedure Laterality Date  . ADENOIDECTOMY    . APPENDECTOMY  1965  . [place]   left  . CATARACT EXTRACTION W/PHACO Right 10/27/2017   Procedure: CATARACT EXTRACTION PHACO AND INTRAOCULAR LENS PLACEMENT ([place]) RIGHT;  Surgeon: Leandrew Koyanagi, MD;  Location: [place];  Service: Ophthalmology;  Laterality: Right;  requests early (8-8:30 arrival)  . CATARACT EXTRACTION W/PHACO Left 11/10/2017   Procedure: CATARACT EXTRACTION PHACO AND INTRAOCULAR LENS PLACEMENT  ([place]) LEFT;  Surgeon: Leandrew Koyanagi, MD;  Location: [place];  Service: Ophthalmology;  Laterality: Left;  . CHOLECYSTECTOMY    . [place]  . laser vein surgery    . LOOP RECORDER INSERTION N/A 09/24/2017   Procedure: LOOP RECORDER INSERTION;  Surgeon: Evans Lance, MD;  Location: [place] [facility];  Service: Cardiovascular;  Laterality: N/A;  . MANDIBLE RECONSTRUCTION  1992  . TOENAIL EXCISION  1998   several  . [place]  . VAGINAL HYSTERECTOMY  1983   als0- anterior/posterior colporrhaphy  . VAGINAL WOUND CLOSURE / REPAIR  1999, 2000  . VAGINAL WOUND CLOSURE / REPAIR      There were no vitals filed for this visit.  Subjective Assessment - 12/24/17 1007    Subjective  Patient has been having some L shoulder pain. Reports doing leg exercises at home. States that stretching are helping her IT band to feel less sore.     Patient is accompained by:  Family member    Pertinent History  Pt is a 71 y.o female that presents to therapy s/p CVA; On April 24, 2017 pt suffered an acute infarct in right basal ganglia. She was in hospital 3 days; after pt was transferred to [facility] where she did inpatient  therapy; Pt then completed home PT until about 2 weeks ago; Pt has pain in shoulder when moving it with R UE and reports that deltoid is paralyzed; Pt fell out of bed one day and fell on L UE; Pt reported doctor put a shot in L shoulder and will receive 3 more shots in shoulder; Pt reports no sensation or numbness and tingling with LE just some sensation impaired in L hand; Pt has an air cast for L LE and splint for wrist but does not use because difficulty donning;  Pt has had trouble sleeping at night with waking and going to the bathroom but denies any pain;  Only has pain in shoulder when moving described as throbbing; Pt reports not using her cane in the last month and states her balance is fine without it;     Limitations  Lifting;Standing;Walking     How long can you sit comfortably?  hours     How long can you stand comfortably?  30 min     How long can you walk comfortably?  better in the morning because more energy;     Diagnostic tests  x rays in shoulder and hand; all clear; doctor stated "inferior pseudo-subluxation from paralysis of the deltoid. she has advanced left hand arthritis"    Patient Stated Goals  walking better without the cane;     Currently in Pain?  Yes    Pain Score  6     Pain Location  Shoulder    Pain Orientation  Left    Pain Descriptors / Indicators  Aching    Pain Type  Chronic pain    Pain Onset  More than a month ago    Pain Frequency  Intermittent     Weight shift onto LLE with green pad under RLE 2 minutes in // bars with no UE support. Initially requires tactile cueing to shift weight over LLE.   Weight shift onto LLE with green pad under RLE with horizontal head turns 20x. In // bars no UE support.   Stoplight: green, yellow, orange, red cones: green to yellow: slow exaggerated walk, yellow to orange: normal orange to red: fast walk,  6x. Patient challenged by slow exaggerated movement with noted inversion of L ankle limiting stability.   Supine pnf D2 pattern PROM 15x, AROM 10x   Straight leg nerve glide 20x supine with LLE on PT's shoulder (pf df and pf 20x)   Hamstring stretch 60 seconds supine with PT holding   SLR 10x LLE noted inversion of foot.   Seated RTB eversion LLE. 15x with PT holding band , patient performed 15x demonstrating independence of new HEP exercise  Seated marching 20x.   Ambulating in hallway reading cards with horizontal head turns, no LOB,2x 100 ft, CGA  Resisted pf GTB 15x LLE seated  Resisted df GTB 15x LLE seated  Seated knee extension with adduction(holding ball between ankles) 15x   Seated adduction squeezing ball between knees 15x   Pt. response to medical necessity: Patient will continue to benefit from skilled physical therapy to improve gait  mechanics, strength, and coordination for improved QOL.                         PT Education - 12/24/17 1009    Education provided  Yes    Education Details  strengthening and balance with functional mobility    Person(s) Educated  Patient    Methods  Explanation;Demonstration;Verbal  cues    Comprehension  Verbalized understanding;Returned demonstration          PT Long Term Goals - 11/23/17 1023      PT LONG TERM GOAL #1   Title  Pt will become independent with HEP in order to improve ADL independence;    Baseline  Pt performs several exercises frequently, but inconsistently performs other exercises.     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 on 8/28; improved to 1.25 m/s on 11/23/17    Time  12    Period  Weeks    Status  Achieved    Target Date  01/18/18      PT LONG TERM GOAL #4   Title  Pt will improve 6MWT to >1300 feet in order to be considered 80% of age group norm and to improve activity tolerance and community ambulation distance for improved IADLs (revised from 1000")    Baseline  565 feet, improved to 875' on 8/28, improved to 1105' on 9/19; improved to 1245 on 11/23/17    Time  12    Period  Weeks    Status  Partially Met    Target Date  01/18/18      PT LONG TERM GOAL #5   Title  Pt will increase strength in L LE to 4+/5 for all muscles in order to  improve mobility and function with daily activities;     Baseline  pt is 4+/5 in all LE except for L ankle DF: 4/5    Time  12    Period  Weeks    Status  Partially Met    Target Date  01/18/18      PT LONG TERM GOAL #6   Title  Pt will ambulate with equal stride length, no toe drag on LLE, and equal stance and swing time bilaterally to demonstrate improved gait mechanics for more efficient and functional mobility.    Time  12    Period  Weeks    Status  Partially Met    Target Date  01/18/18            Plan - 12/24/17 1146    Clinical Impression Statement  Patient has noted inversion of L foot when performing tasks that require concentration or balance indicating it is a neurological patterning combined with a weakness of extrinsic muscles. Patient ambulated with decreased inversion after eversion seated exercise. Patient continues to require cueing for weight acceptance of LLE due to fear of utilization of LLE and weak musculature. Patient will continue to benefit from skilled physical therapy to improve gait  mechanics, strength, and coordination for improved QOL.     Rehab Potential  Fair    Clinical Impairments Affecting Rehab Potential  neg: age, lives alone, comorbidities, pos: wants to be more independent; already high functioning     PT Frequency  2x / week    PT Duration  8 weeks    PT Treatment/Interventions  ADLs/Self Care Home Management;Aquatic Therapy;Moist Heat;Cryotherapy;DME Instruction;Gait training;Stair training;Functional mobility training;Therapeutic activities;Therapeutic exercise;Balance training;Neuromuscular re-education;Manual techniques;Passive range of motion;Energy conservation;Electrical Stimulation;Patient/family education;Orthotic Fit/Training;Dry needling    PT Next Visit Plan  balance on unstable surfaces, stairs,     PT Home Exercise Plan  continue as given;     Consulted and Agree with Plan of Care  Patient       Patient will benefit from skilled  therapeutic intervention in order to improve the following deficits and impairments:  Abnormal gait, Impaired sensation, Decreased mobility, Decreased activity tolerance, Decreased endurance, Decreased range of motion, Decreased strength, Impaired UE functional use, Decreased balance, Decreased safety awareness, Difficulty walking, Pain  Visit Diagnosis: Muscle weakness (generalized)  Other lack of coordination  Other abnormalities of gait and mobility  Hemiplegia and hemiparesis following cerebral infarction affecting left dominant side [facility])     Problem List Patient Active Problem List   Diagnosis Date Noted  . Left arm pain 11/05/2017  . Adhesive capsulitis of left shoulder 08/06/2017  . Urinary frequency 07/11/2017  . Gait disturbance, post-stroke 05/31/2017  . Left spastic hemiparesis ([place]) 05/31/2017  . Adjustment disorder with mixed anxiety and depressed mood   . Hypokalemia   . HCAP (healthcare-associated pneumonia)   . Cough with fever   . 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 FUO (fever of unknown origin)   . Oral thrush   . Benign essential HTN   . Rheumatoid arthritis involving both hands ([place]) 04/28/2017  . Right-sided lacunar infarction 04/27/2017  . CVA (cerebral vascular accident) ([place]) 04/24/2017  . LGSIL Pap smear of vagina 12/15/2016  . Anal skin tag 02/02/2016  . Inflammatory arthritis 01/19/2016  . Right knee pain 01/19/2016  . Genital herpes 10/20/2015  . GERD (gastroesophageal reflux disease) 10/20/2015  . Status post vaginal hysterectomy 10/20/2015  . Health care maintenance 05/30/2015  . LLQ pain 03/31/2014  . Menopausal symptoms 03/31/2014  . Hypercholesterolemia 12/31/2012  . Hypothyroidism 12/29/2012  . Osteoarthritis 12/29/2012  . PULMONARY NODULE 01/02/2008  . Mild depression ([place]) 12/30/2007  . Essential hypertension 12/30/2007   Janna Arch, PT, DPT   Janna Arch 12/24/2017, 11:47 AM  [place] MAIN [facility] SERVICES [address] [place], [place], [address] Phone: [phone]   Fax:  [phone]  Name: Donna Rhodes MRN: [id number] Date of Birth: 1946-09-30

## 2017-12-24 NOTE — Therapy (Signed)
[place] MAIN [facility] SERVICES [address] [place], [place], [address] Phone: [phone]   Fax:  [phone]  Occupational Therapy Treatment  Patient Details  Name: Donna Rhodes MRN: [id number] Date of Birth: October 04, 1946 Referring Provider (Historical): Dr. Letta Pate   Encounter Date: 12/24/2017  OT End of Session - 12/24/17 1201    Visit Number  30    Number of Visits  48    Date for OT Re-Evaluation  01/03/18    Authorization Type  Medicare G Code 1 of 10    OT Start Time  1937    OT Stop Time  1130    OT Time Calculation (min)  45 min    Activity Tolerance  Patient tolerated treatment well    Behavior During Therapy  [facility] for tasks assessed/performed       Past Medical History:  Diagnosis Date  . Atrophic vaginitis   . Dysplasia of vagina    vin 1 - vulva and vaginal cuff  . GERD (gastroesophageal reflux disease)   . HCV infection   . HSV (herpes simplex virus) infection   . Hypercholesterolemia   . Hypertension   . Hypothyroidism   . Left-sided weakness    S/P CVA 4/18.  PT 2x/wk.  . Lung nodule    stable  . Menopausal state   . Osteoarthritis    hand involvement, cervical disc disease  . Osteoporosis   . Pelvic pain in female   . Rheumatoid arthritis ([place])   . Stroke (cerebrum) ([place]) 04/24/2017  . Thyroid nodule   . Vaginal Pap smear, abnormal    lgsil    Past Surgical History:  Procedure Laterality Date  . ADENOIDECTOMY    . APPENDECTOMY  1965  . [place]   left  . CATARACT EXTRACTION W/PHACO Right 10/27/2017   Procedure: CATARACT EXTRACTION PHACO AND INTRAOCULAR LENS PLACEMENT ([place]) RIGHT;  Surgeon: Leandrew Koyanagi, MD;  Location: [place];  Service: Ophthalmology;  Laterality: Right;  requests early (8-8:30 arrival)  . CATARACT EXTRACTION W/PHACO Left 11/10/2017   Procedure: CATARACT EXTRACTION PHACO AND INTRAOCULAR LENS PLACEMENT ([place]) LEFT;  Surgeon: Leandrew Koyanagi, MD;   Location: [place];  Service: Ophthalmology;  Laterality: Left;  . CHOLECYSTECTOMY    . [place]  . laser vein surgery    . LOOP RECORDER INSERTION N/A 09/24/2017   Procedure: LOOP RECORDER INSERTION;  Surgeon: Evans Lance, MD;  Location: [place] [facility];  Service: Cardiovascular;  Laterality: N/A;  . MANDIBLE RECONSTRUCTION  1992  . TOENAIL EXCISION  1998   several  . [place]  . VAGINAL HYSTERECTOMY  1983   als0- anterior/posterior colporrhaphy  . VAGINAL WOUND CLOSURE / REPAIR  1999, 2000  . VAGINAL WOUND CLOSURE / REPAIR      There were no vitals filed for this visit.  Subjective Assessment - 12/24/17 1153    Subjective   Pt. reports she has to go to court to evict her daughter who is in rehab.    Patient is accompained by:  Family member    Pertinent History  Pt. is a 71 y.o. female who had a CVA on 04/24/2017. Pt. went to the ER when she started not feeling well, and having heart palpitations. Pt. reports having work up initially, however was sent home. Pt. reports later in the day she was unable to write with her left hand. Pt. returned to the ER, and was admitted  with workup for a CVA. Pt. was admitted to [facility], and stayed for approximately 3 weeks. Pt. returned home alone, and  received home health services for the past 2 months. Pt. now presents for Outpatient OT services. Pt. resides home alone, and has support children who stop by daily, assist with shopping, transportation, and  ADL/IADL tasks as needed.     Limitations  Pt. is unable to use her dominant RUE during functional tasks.    Patient Stated Goals  To get back the way she was. (Independent)    Currently in Pain?  Yes    Pain Score  6     Pain Location  Shoulder    Pain Orientation  Left    Pain Descriptors / Indicators  Aching    Pain Type  Chronic pain    Pain Onset  More than a month ago       OT TREATMENT    Therapeutic Exercise:  Pt.  performed PROM in all joint ranges for shoulder flexion, extension. AAROM/for elbow flexion, extension, forearm supination, wrist extension, digit extension, thumb abduction. Pt. Worked on digit, and wrist extension with facilitation.   Selfcare:  Pt. Performed hand to face patterns for light grooming tasks. Pt. Worked on using her left hand for hand to face patterns, and to wipe/wash her right UE.                       OT Education - 12/24/17 1200    Education provided  Yes    Education Details  UE strength, and coordination    Person(s) Educated  Patient    Methods  Explanation;Demonstration;Verbal cues    Comprehension  Verbalized understanding;Returned demonstration          OT Long Term Goals - 12/17/17 1106      OT LONG TERM GOAL #1   Title  Pt. will tolerate  shoulder PROM with pain improved to under 3/10    Baseline  Limited by pain    Time  12    Period  Weeks    Status  Revised    Target Date  01/03/18      OT LONG TERM GOAL #2   Title  Pt. will formulate a full fist to be able to stabilize objects with her Left hand while using her right hand.     Baseline  limited    Time  12    Period  Weeks    Status  On-going    Target Date  01/03/18      OT LONG TERM GOAL #3   Title  Pt. will be able to wipe face independently with her Left UE.    Baseline  Pt. is unable 10/11/2017: hand-over hand assist required    Time  12    Period  Weeks    Status  On-going    Target Date  01/03/18      OT LONG TERM GOAL #4   Title  Pt. will demonstrate independence with splint application.    Baseline  Pt. is unable to donn splint 10/11/2017: assist required    Time  12    Period  Weeks    Status  On-going    Target Date  01/03/18      OT LONG TERM GOAL #5   Title  Pt. will initiate active grasp release with thumb to 2nd digit 100% of the time to be able to hold a utensil  in preparation for self-feeding.    Baseline  Minimal thumb movement. 10/11/2018:  Pt. conitnues to have limited thumb motion.    Time  12    Period  Weeks    Status  On-going    Target Date  01/03/18      OT LONG TERM GOAL #6   Title  Pt. will initiate wrist extension in preparation for functional reaching.    Baseline  No active wrist extension. 10/11/2018: inconsistently initiating wrist extension.    Time  12    Period  Weeks    Status  On-going    Target Date  01/03/18            Plan - 12/24/17 1201    Clinical Impression Statement  Pt. reports that she has resumed driving. Pt. reports that her insurance has declined payment for ultrsound. Pt. reports she decided to keep the [employer] sling instead of return it. Pt. and her brother removed the hard plastic hand piece, and replaced it with foam tubing. Pt. continues to require extensive cues for redirection. Pt. continues to present with limited LUE ROM, tone, and tightness, and continues to work on improving ROM, and functioning to improve engagement in ADL, and IADL tasks.    Occupational performance deficits (Please refer to evaluation for details):  ADL's;IADL's    Rehab Potential  Good    OT Frequency  2x / week    OT Duration  12 weeks    OT Treatment/Interventions  Self-care/ADL training;Therapeutic exercise;Patient/family education;DME and/or AE instruction;Therapeutic activities    Clinical Decision Making  Several treatment options, min-mod task modification necessary    Consulted and Agree with Plan of Care  Patient       Patient will benefit from skilled therapeutic intervention in order to improve the following deficits and impairments:  Decreased cognition, Difficulty walking, Decreased balance, Decreased strength, Decreased safety awareness, Decreased coordination, Impaired UE functional use, Impaired tone, Decreased range of motion, Decreased endurance, Decreased activity tolerance, Impaired perceived functional ability, Pain  Visit Diagnosis: Muscle weakness (generalized)    Problem  List Patient Active Problem List   Diagnosis Date Noted  . Left arm pain 11/05/2017  . Adhesive capsulitis of left shoulder 08/06/2017  . Urinary frequency 07/11/2017  . Gait disturbance, post-stroke 05/31/2017  . Left spastic hemiparesis ([place]) 05/31/2017  . Adjustment disorder with mixed anxiety and depressed mood   . Hypokalemia   . HCAP (healthcare-associated pneumonia)   . Cough with fever   . 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 FUO (fever of unknown origin)   . Oral thrush   . Benign essential HTN   . Rheumatoid arthritis involving both hands ([place]) 04/28/2017  . Right-sided lacunar infarction 04/27/2017  . CVA (cerebral vascular accident) ([place]) 04/24/2017  . LGSIL Pap smear of vagina 12/15/2016  . Anal skin tag 02/02/2016  . Inflammatory arthritis 01/19/2016  . Right knee pain 01/19/2016  . Genital herpes 10/20/2015  . GERD (gastroesophageal reflux disease) 10/20/2015  . Status post vaginal hysterectomy 10/20/2015  . Health care maintenance 05/30/2015  . LLQ pain 03/31/2014  . Menopausal symptoms 03/31/2014  . Hypercholesterolemia 12/31/2012  . Hypothyroidism 12/29/2012  . Osteoarthritis 12/29/2012  . PULMONARY NODULE 01/02/2008  . Mild depression ([place]) 12/30/2007  . Essential hypertension 12/30/2007    Harrel Carina, MS, OTR/L 12/24/2017, 12:18 PM  [place] MAIN [facility] SERVICES [address] [place], [place], [address] Phone: [phone]   Fax:  [phone]  Name: Donna Rhodes MRN: [id number] Date of Birth: [birth year]

## 2017-12-30 ENCOUNTER — Ambulatory Visit: Payer: PPO | Admitting: Physical Therapy

## 2017-12-30 ENCOUNTER — Ambulatory Visit: Payer: PPO | Attending: Physical Medicine & Rehabilitation | Admitting: Occupational Therapy

## 2017-12-30 DIAGNOSIS — M6281 Muscle weakness (generalized): Secondary | ICD-10-CM | POA: Insufficient documentation

## 2017-12-30 DIAGNOSIS — R2689 Other abnormalities of gait and mobility: Secondary | ICD-10-CM | POA: Insufficient documentation

## 2017-12-30 DIAGNOSIS — R278 Other lack of coordination: Secondary | ICD-10-CM | POA: Insufficient documentation

## 2018-01-03 LAB — CUP PACEART REMOTE DEVICE CHECK
Date Time Interrogation Session: 20181227170536
Implantable Pulse Generator Implant Date: 20180928

## 2018-01-04 ENCOUNTER — Encounter: Payer: Self-pay | Admitting: Physical Therapy

## 2018-01-04 ENCOUNTER — Ambulatory Visit: Payer: PPO | Admitting: Occupational Therapy

## 2018-01-04 ENCOUNTER — Ambulatory Visit: Payer: PPO | Admitting: Physical Therapy

## 2018-01-04 ENCOUNTER — Encounter: Payer: Self-pay | Admitting: Occupational Therapy

## 2018-01-04 DIAGNOSIS — R2689 Other abnormalities of gait and mobility: Secondary | ICD-10-CM | POA: Diagnosis not present

## 2018-01-04 DIAGNOSIS — M6281 Muscle weakness (generalized): Secondary | ICD-10-CM

## 2018-01-04 DIAGNOSIS — R278 Other lack of coordination: Secondary | ICD-10-CM | POA: Diagnosis not present

## 2018-01-04 NOTE — Therapy (Signed)
[place] MAIN [facility] SERVICES [address] [place], [place], [address] Phone: [phone]   Fax:  [phone]  Occupational Therapy Treatment/Recertification Note  Patient Details  Name: Donna Rhodes MRN: [id number] Date of Birth: 11-04-1946 Referring Provider (Historical): Dr. Letta Pate   Encounter Date: 01/04/2018  OT End of Session - 01/04/18 1053    Visit Number  31    Number of Visits  42    Date for OT Re-Evaluation  03/29/18    OT Start Time  1015    OT Stop Time  1045    OT Time Calculation (min)  30 min    Activity Tolerance  Patient tolerated treatment well    Behavior During Therapy  [facility] for tasks assessed/performed       Past Medical History:  Diagnosis Date  . Atrophic vaginitis   . Dysplasia of vagina    vin 1 - vulva and vaginal cuff  . GERD (gastroesophageal reflux disease)   . HCV infection   . HSV (herpes simplex virus) infection   . Hypercholesterolemia   . Hypertension   . Hypothyroidism   . Left-sided weakness    S/P CVA 4/18.  PT 2x/wk.  . Lung nodule    stable  . Menopausal state   . Osteoarthritis    hand involvement, cervical disc disease  . Osteoporosis   . Pelvic pain in female   . Rheumatoid arthritis ([place])   . Stroke (cerebrum) ([place]) 04/24/2017  . Thyroid nodule   . Vaginal Pap smear, abnormal    lgsil    Past Surgical History:  Procedure Laterality Date  . ADENOIDECTOMY    . APPENDECTOMY  1965  . [place]   left  . CATARACT EXTRACTION W/PHACO Right 10/27/2017   Procedure: CATARACT EXTRACTION PHACO AND INTRAOCULAR LENS PLACEMENT ([place]) RIGHT;  Surgeon: Leandrew Koyanagi, MD;  Location: [place];  Service: Ophthalmology;  Laterality: Right;  requests early (8-8:30 arrival)  . CATARACT EXTRACTION W/PHACO Left 11/10/2017   Procedure: CATARACT EXTRACTION PHACO AND INTRAOCULAR LENS PLACEMENT ([place]) LEFT;  Surgeon: Leandrew Koyanagi, MD;  Location: [place];  Service: Ophthalmology;  Laterality: Left;  . CHOLECYSTECTOMY    . [place]  . laser vein surgery    . LOOP RECORDER INSERTION N/A 09/24/2017   Procedure: LOOP RECORDER INSERTION;  Surgeon: Evans Lance, MD;  Location: [place] CV LAB;  Service: Cardiovascular;  Laterality: N/A;  . MANDIBLE RECONSTRUCTION  1992  . TOENAIL EXCISION  1998   several  . [place]  . VAGINAL HYSTERECTOMY  1983   als0- anterior/posterior colporrhaphy  . VAGINAL WOUND CLOSURE / REPAIR  1999, 2000  . VAGINAL WOUND CLOSURE / REPAIR      There were no vitals filed for this visit.  Subjective Assessment - 01/04/18 1052    Subjective   Pt. reports she gained 5 pounds.    Patient is accompained by:  Family member    Pertinent History  Pt. is a 72 y.o. female who had a CVA on 04/24/2017. Pt. went to the ER when she started not feeling well, and having heart palpitations. Pt. reports having work up initially, however was sent home. Pt. reports later in the day she was unable to write with her left hand. Pt. returned to the ER, and was admitted with workup for a CVA. Pt. was admitted to [facility], and stayed for approximately 3 weeks. Pt. returned  home alone, and  received home health services for the past 2 months. Pt. now presents for Outpatient OT services. Pt. resides home alone, and has support children who stop by daily, assist with shopping, transportation, and  ADL/IADL tasks as needed.     Limitations  Pt. is unable to use her dominant RUE during functional tasks.    Patient Stated Goals  To get back the way she was. (Independent)    Currently in Pain?  Yes    Pain Score  8     Pain Location  Arm    Pain Orientation  Left    Pain Frequency  Intermittent       OT TREATMENT    Therapeutic Exercise:  Goals were reviewed and updated. Pt. Tolerated PROM for left shoulder flexion, abduction. AAROM for elbow flexion, extension, forearm supination. PROM for  wrist, and digit extension. Pt. Worked on AAROM in preparation for hand to mouth patterns.                       OT Education - 01/04/18 1053    Education provided  Yes    Education Details  LUE ROM    Person(s) Educated  Patient    Methods  Demonstration;Explanation;Verbal cues    Comprehension  Verbalized understanding;Returned demonstration          OT Long Term Goals - 01/04/18 1149      OT LONG TERM GOAL #1   Title  Pt. will tolerate  shoulder PROM with pain improved to under 3/10    Baseline  Limited by pain    Time  12    Status  On-going    Target Date  03/29/18      OT LONG TERM GOAL #2   Title  Pt. will formulate a full fist to be able to stabilize objects with her Left hand while using her right hand.     Baseline  limited    Time  12    Period  Weeks    Status  On-going    Target Date  03/29/18      OT LONG TERM GOAL #3   Title  Pt. will be able to wipe face independently with her Left UE.    Baseline  Pt. is unable 10/11/2017: hand-over hand assist required    Time  12    Status  On-going    Target Date  03/29/18      OT LONG TERM GOAL #4   Title  Pt. will demonstrate independence with splint application.    Baseline  Pt. is unable to donn splint 10/11/2017: assist required    Time  12    Period  Weeks    Status  On-going    Target Date  03/29/18      OT LONG TERM GOAL #5   Title  Pt. will initiate active grasp release with thumb to 2nd digit 100% of the time to be able to hold a utensil in preparation for self-feeding.    Baseline  Minimal thumb movement. 10/11/2018: Pt. conitnues to have limited thumb motion.    Time  12    Period  Weeks    Status  On-going    Target Date  03/29/18      OT LONG TERM GOAL #6   Title  Pt. will initiate wrist extension in preparation for functional reaching.    Baseline  No active wrist extension. 10/11/2018: inconsistently initiating wrist extension.  Time  12    Period  Weeks    Status   On-going    Target Date  03/29/18            Plan - 01/04/18 1056    Clinical Impression Statement  Pt. reports she that she continues to drive to the grocery store, pet groomers, and therapy.  Pt. perseverates on multiple aspects of her LUE, and requires verbal cues for redirection. Pt. reports that she did not do anything with her LUE at home because she gets fed up with her Left arm. Pt. continues to present with increased tone in her LUE, hand, intermittent pain throughout her shoulder, and weakness. Pt. Goals were reviewed.   Occupational Profile and client history currently impacting functional performance  Pt. resides home alone, is retired, and has supprotive family.    Occupational performance deficits (Please refer to evaluation for details):  ADL's;IADL's    OT Frequency  2x / week    OT Duration  12 weeks    OT Treatment/Interventions  Self-care/ADL training;Therapeutic exercise;Patient/family education;DME and/or AE instruction;Therapeutic activities    Clinical Decision Making  Several treatment options, min-mod task modification necessary    Consulted and Agree with Plan of Care  Patient       Patient will benefit from skilled therapeutic intervention in order to improve the following deficits and impairments:  Decreased cognition, Difficulty walking, Decreased balance, Decreased strength, Decreased safety awareness, Decreased coordination, Impaired UE functional use, Impaired tone, Decreased range of motion, Decreased endurance, Decreased activity tolerance, Impaired perceived functional ability, Pain  Visit Diagnosis: Muscle weakness (generalized)    Problem List Patient Active Problem List   Diagnosis Date Noted  . Left arm pain 11/05/2017  . Adhesive capsulitis of left shoulder 08/06/2017  . Urinary frequency 07/11/2017  . Gait disturbance, post-stroke 05/31/2017  . Left spastic hemiparesis ([place]) 05/31/2017  . Adjustment disorder with mixed anxiety and  depressed mood   . Hypokalemia   . HCAP (healthcare-associated pneumonia)   . Cough with fever   . 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 FUO (fever of unknown origin)   . Oral thrush   . Benign essential HTN   . Rheumatoid arthritis involving both hands ([place]) 04/28/2017  . Right-sided lacunar infarction 04/27/2017  . CVA (cerebral vascular accident) ([place]) 04/24/2017  . LGSIL Pap smear of vagina 12/15/2016  . Anal skin tag 02/02/2016  . Inflammatory arthritis 01/19/2016  . Right knee pain 01/19/2016  . Genital herpes 10/20/2015  . GERD (gastroesophageal reflux disease) 10/20/2015  . Status post vaginal hysterectomy 10/20/2015  . Health care maintenance 05/30/2015  . LLQ pain 03/31/2014  . Menopausal symptoms 03/31/2014  . Hypercholesterolemia 12/31/2012  . Hypothyroidism 12/29/2012  . Osteoarthritis 12/29/2012  . PULMONARY NODULE 01/02/2008  . Mild depression ([place]) 12/30/2007  . Essential hypertension 12/30/2007    Harrel Carina, MS, OTR/L 01/04/2018, 12:02 PM  [place] MAIN [facility] SERVICES [address] [place], [place], [address] Phone: [phone]   Fax:  [phone]  Name: STEVI HOLLINSHEAD MRN: [id number] Date of Birth: 26-Dec-1946

## 2018-01-04 NOTE — Patient Instructions (Addendum)
Dorsiflexion: Resisted    Sitting in chair, with red band around left foot, stepping on band with right foot and hold band in right hand; Lift left foot up (try not to let it turn in) Repeat _15___ times per set. Do _2___ sets per session. Do __1__ sessions per day.  http://orth.exer.us/9   Copyright  VHI. All rights reserved.  Eversion: Resisted    Sitting with red band around left foot, stepping on band with right foot, holding band in right hand, Lift left foot up and then turn out to side.  Repeat _15___ times per set. Do __2__ sets per session. Do __1__ sessions per day.  http://orth.exer.us/15   Copyright  VHI. All rights reserved.

## 2018-01-04 NOTE — Therapy (Signed)
[place] MAIN [facility] SERVICES [address] [place], [place], [address] Phone: [phone]   Fax:  [phone]  Physical Therapy Treatment/Progress Note  Patient Details  Name: Donna Rhodes MRN: [id number] Date of Birth: 1946/08/07 Referring Provider: Alysia Penna   Encounter Date: 01/04/2018  PT End of Session - 01/04/18 1057    Visit Number  25    Number of Visits  45    Date for PT Re-Evaluation  02/01/18    PT Start Time  1048    PT Stop Time  1130    PT Time Calculation (min)  42 min    Equipment Utilized During Treatment  Gait belt    Activity Tolerance  Patient tolerated treatment well;No increased pain    Behavior During Therapy  WFL for tasks assessed/performed       Past Medical History:  Diagnosis Date  . Atrophic vaginitis   . Dysplasia of vagina    vin 1 - vulva and vaginal cuff  . GERD (gastroesophageal reflux disease)   . HCV infection   . HSV (herpes simplex virus) infection   . Hypercholesterolemia   . Hypertension   . Hypothyroidism   . Left-sided weakness    S/P CVA 4/18.  PT 2x/wk.  . Lung nodule    stable  . Menopausal state   . Osteoarthritis    hand involvement, cervical disc disease  . Osteoporosis   . Pelvic pain in female   . Rheumatoid arthritis ([place])   . Stroke (cerebrum) ([place]) 04/24/2017  . Thyroid nodule   . Vaginal Pap smear, abnormal    lgsil    Past Surgical History:  Procedure Laterality Date  . ADENOIDECTOMY    . APPENDECTOMY  1965  . [place]   left  . CATARACT EXTRACTION W/PHACO Right 10/27/2017   Procedure: CATARACT EXTRACTION PHACO AND INTRAOCULAR LENS PLACEMENT ([place]) RIGHT;  Surgeon: Leandrew Koyanagi, MD;  Location: [place];  Service: Ophthalmology;  Laterality: Right;  requests early (8-8:30 arrival)  . CATARACT EXTRACTION W/PHACO Left 11/10/2017   Procedure: CATARACT EXTRACTION PHACO AND INTRAOCULAR LENS PLACEMENT ([place]) LEFT;  Surgeon:  Leandrew Koyanagi, MD;  Location: [place];  Service: Ophthalmology;  Laterality: Left;  . CHOLECYSTECTOMY    . [place]  . laser vein surgery    . LOOP RECORDER INSERTION N/A 09/24/2017   Procedure: LOOP RECORDER INSERTION;  Surgeon: Evans Lance, MD;  Location: [place] CV LAB;  Service: Cardiovascular;  Laterality: N/A;  . MANDIBLE RECONSTRUCTION  1992  . TOENAIL EXCISION  1998   several  . [place]  . VAGINAL HYSTERECTOMY  1983   als0- anterior/posterior colporrhaphy  . VAGINAL WOUND CLOSURE / REPAIR  1999, 2000  . VAGINAL WOUND CLOSURE / REPAIR      There were no vitals filed for this visit.  Subjective Assessment - 01/04/18 1054    Subjective  Patient reports no pain currently; she reports that she has been bending over at home to help care for her pets/plant flowers and that has been okay; she does still have trouble with walking especially long distances; Reports doing some exercise but states that she isn't doing it as much as she should.     Patient is accompained by:  Family member    Pertinent History  Pt is a 72 y.o female that presents to therapy s/p CVA; On April 24, 2017 pt suffered an acute infarct in right  basal ganglia. She was in hospital 3 days; after pt was transferred to [facility] where she did inpatient therapy; Pt then completed home PT until about 2 weeks ago; Pt has pain in shoulder when moving it with R UE and reports that deltoid is paralyzed; Pt fell out of bed one day and fell on L UE; Pt reported doctor put a shot in L shoulder and will receive 3 more shots in shoulder; Pt reports no sensation or numbness and tingling with LE just some sensation impaired in L hand; Pt has an air cast for L LE and splint for wrist but does not use because difficulty donning;  Pt has had trouble sleeping at night with waking and going to the bathroom but denies any pain;  Only has pain in shoulder when moving described as throbbing; Pt  reports not using her cane in the last month and states her balance is fine without it;     Limitations  Lifting;Standing;Walking    How long can you sit comfortably?  hours     How long can you stand comfortably?  30 min     How long can you walk comfortably?  better in the morning because more energy;     Diagnostic tests  x rays in shoulder and hand; all clear; doctor stated "inferior pseudo-subluxation from paralysis of the deltoid. she has advanced left hand arthritis"    Patient Stated Goals  walking better without the cane;     Currently in Pain?  No/denies    Pain Onset  More than a month ago         [facility] PT Assessment - 01/04/18 0001      Strength   Left Hip Flexion  5/5    Left Hip ABduction  4+/5    Left Hip ADduction  5/5    Left Knee Flexion  4+/5    Left Knee Extension  5/5    Left Ankle Dorsiflexion  4+/5      6 minute walk test results    Aerobic Endurance Distance Walked  1350    Endurance additional comments  without AD with reciprocal gait pattern;  Improved from 1245 feet on 11/23/17      TREATMENT: Warm up on Nustep RUE/BLE level 2 x5 min (Unbilled);  PT assessed LLE strength and instructed patient in 6 min walk; see above;   Patient reports having trouble controlling LLE position when descending stairs; Instructed patient in 4 step negotiation, forward reciprocal ascend/descend with 1-0 rail assist x3sets; Patient exhibits increased ankle IV during DF when stepping down with LLE with slight adduction due to poor coordination; Pt also required CGA when ascend/descend without rail assist with decreased LLE control;  Patient educated on importance of motor control during exercise/ADLs to help control LLE foot placement.   Sitting: LLE ankle DF, EV red tband x15 bilaterally; Patient admits that she is only doing 10 reps on LLE 1x a day; Advanced HEP to increase to 2 sets of 15 with sitting ankle tband exercise for better ankle strengthening; See patient  instructions;  Patient denies any pain but reports mild fatigue at end of session;                  PT Education - 01/04/18 1056    Education provided  Yes    Education Details  progress towards goals, recommendation;     Person(s) Educated  Patient    Methods  Explanation;Demonstration;Verbal cues    Comprehension  Verbalized understanding;Returned demonstration;Verbal cues required;Need further instruction          PT Long Term Goals - 01/04/18 1109      PT LONG TERM GOAL #1   Title  Pt will become independent with HEP in order to improve ADL independence;    Baseline  Pt performs several exercises frequently, but inconsistently performs other exercises.     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 on 8/28; improved to 1.25 m/s on 11/23/17    Time  12    Period  Weeks    Status  Achieved      PT LONG TERM GOAL #4   Title  Pt will improve 6MWT to >1300 feet in order to be considered 80% of age group norm and to improve activity tolerance and community ambulation distance for improved IADLs (revised from 1000")    Baseline  565 feet, improved to 875' on 8/28, improved to 1105' on 9/19; improved to 1245 on 11/23/17    Time  12    Period  Weeks    Status  Achieved      PT LONG TERM GOAL #5   Title  Pt will increase strength in L LE to 4+/5 for all muscles in order to improve mobility and function with  daily activities;     Baseline  pt is 4+/5 in all LE except for L ankle DF: 4/5    Time  12    Period  Weeks    Status  Achieved      PT LONG TERM GOAL #6   Title  Pt will ambulate with equal stride length, no toe drag on LLE, and equal stance and swing time bilaterally to demonstrate improved gait mechanics for more efficient and functional mobility.    Time  12    Period  Weeks    Status  Achieved      PT LONG TERM GOAL #7   Title  Patient will ascend/descend 4 stairs without rail assist independently without loss of balance to improve ability to get in/out of home while carrying things.      Baseline  requires 1 rail assist    Time  4    Period  Weeks    Status  New    Target Date  02/01/18            Plan - 01/04/18 1131    Clinical Impression Statement  Patient exhibits improved gait abilty being able to walk longer distance with less fatigue per 6 min walk test; Patient does continue to have flexor synergy pattern with increased ankle IV during DF; She requires  CGA for stair negotiation especially with less rail assist. Patient also has poor coordination with LLE when descending at times scissoring with increased adduction; Patient would benefit from additional skilled PT intervention to improve knee/ankle control; She was educated on importance of HEP adherence as she hasn't been as compliant in the past. Patient would benefit from additional skilled PT intervention to improve strength, balance and gait safety;     Rehab Potential  Fair    Clinical Impairments Affecting Rehab Potential  neg: age, lives alone, comorbidities, pos: wants to be more independent; already high functioning     PT Frequency  2x / week    PT Duration  8 weeks    PT Treatment/Interventions  ADLs/Self Care Home Management;Aquatic Therapy;Moist Heat;Cryotherapy;DME Instruction;Gait training;Stair training;Functional mobility training;Therapeutic activities;Therapeutic exercise;Balance  training;Neuromuscular re-education;Manual techniques;Passive range of motion;Energy conservation;Electrical Stimulation;Patient/family education;Orthotic Fit/Training;Dry needling    PT Next Visit Plan  balance on unstable surfaces, stairs,     PT Home Exercise Plan  continue as given;     Consulted and Agree with Plan of Care  Patient       Patient will benefit from skilled therapeutic intervention in order to improve the following deficits and impairments:  Abnormal gait, Impaired sensation, Decreased mobility, Decreased activity tolerance, Decreased endurance, Decreased range of motion, Decreased strength, Impaired UE functional use, Decreased balance, Decreased safety awareness, Difficulty walking, Pain  Visit Diagnosis: Muscle weakness (generalized)  Other lack of coordination  Other abnormalities of gait and mobility     Problem List Patient Active Problem List   Diagnosis Date Noted  . Left arm pain 11/05/2017  . Adhesive capsulitis of left shoulder 08/06/2017  . Urinary frequency 07/11/2017  . Gait disturbance, post-stroke 05/31/2017  . Left spastic hemiparesis ([place]) 05/31/2017  . Adjustment disorder with mixed anxiety and depressed mood   . Hypokalemia   . HCAP (healthcare-associated pneumonia)   . Cough with fever   . 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 FUO (fever of unknown origin)   . Oral thrush   . Benign essential HTN   . Rheumatoid arthritis involving both hands ([place]) 04/28/2017  . Right-sided lacunar infarction 04/27/2017  . CVA (cerebral vascular accident) ([place]) 04/24/2017  . LGSIL Pap smear of vagina 12/15/2016  . Anal skin tag 02/02/2016  . Inflammatory arthritis 01/19/2016  . Right knee pain 01/19/2016  . Genital herpes 10/20/2015  . GERD (gastroesophageal reflux disease) 10/20/2015  . Status post vaginal hysterectomy 10/20/2015  . Health care maintenance 05/30/2015  . LLQ pain 03/31/2014  . Menopausal symptoms 03/31/2014  .  Hypercholesterolemia 12/31/2012  . Hypothyroidism 12/29/2012  . Osteoarthritis 12/29/2012  . PULMONARY NODULE 01/02/2008  . Mild depression ([place]) 12/30/2007  . Essential hypertension 12/30/2007    Leeandre Nordling PT, DPT 01/04/2018, 11:37 AM   [place] MAIN [facility] SERVICES [address] [place], [place], [address] Phone: [phone]   Fax:  [phone]  Name: Donna Rhodes MRN: [id number] Date of Birth: 06/04/1946

## 2018-01-06 ENCOUNTER — Encounter: Payer: Self-pay | Admitting: Physical Medicine & Rehabilitation

## 2018-01-06 ENCOUNTER — Ambulatory Visit: Payer: PPO | Admitting: Physical Therapy

## 2018-01-06 ENCOUNTER — Encounter: Payer: PPO | Attending: Physical Medicine & Rehabilitation

## 2018-01-06 ENCOUNTER — Ambulatory Visit (HOSPITAL_BASED_OUTPATIENT_CLINIC_OR_DEPARTMENT_OTHER): Payer: PPO | Admitting: Physical Medicine & Rehabilitation

## 2018-01-06 ENCOUNTER — Encounter: Payer: Self-pay | Admitting: Physical Therapy

## 2018-01-06 VITALS — BP 136/77 | HR 81 | Resp 14

## 2018-01-06 DIAGNOSIS — M0609 Rheumatoid arthritis without rheumatoid factor, multiple sites: Secondary | ICD-10-CM | POA: Diagnosis not present

## 2018-01-06 DIAGNOSIS — M6281 Muscle weakness (generalized): Secondary | ICD-10-CM | POA: Diagnosis not present

## 2018-01-06 DIAGNOSIS — Z79899 Other long term (current) drug therapy: Secondary | ICD-10-CM | POA: Diagnosis not present

## 2018-01-06 DIAGNOSIS — M7502 Adhesive capsulitis of left shoulder: Secondary | ICD-10-CM | POA: Diagnosis not present

## 2018-01-06 DIAGNOSIS — G8114 Spastic hemiplegia affecting left nondominant side: Secondary | ICD-10-CM | POA: Insufficient documentation

## 2018-01-06 DIAGNOSIS — R269 Unspecified abnormalities of gait and mobility: Secondary | ICD-10-CM | POA: Insufficient documentation

## 2018-01-06 DIAGNOSIS — E785 Hyperlipidemia, unspecified: Secondary | ICD-10-CM | POA: Diagnosis not present

## 2018-01-06 DIAGNOSIS — M7989 Other specified soft tissue disorders: Secondary | ICD-10-CM | POA: Insufficient documentation

## 2018-01-06 DIAGNOSIS — R2689 Other abnormalities of gait and mobility: Secondary | ICD-10-CM

## 2018-01-06 DIAGNOSIS — G5692 Unspecified mononeuropathy of left upper limb: Secondary | ICD-10-CM

## 2018-01-06 DIAGNOSIS — Z7982 Long term (current) use of aspirin: Secondary | ICD-10-CM | POA: Insufficient documentation

## 2018-01-06 DIAGNOSIS — Z7902 Long term (current) use of antithrombotics/antiplatelets: Secondary | ICD-10-CM | POA: Insufficient documentation

## 2018-01-06 DIAGNOSIS — I10 Essential (primary) hypertension: Secondary | ICD-10-CM | POA: Diagnosis not present

## 2018-01-06 DIAGNOSIS — K219 Gastro-esophageal reflux disease without esophagitis: Secondary | ICD-10-CM | POA: Diagnosis not present

## 2018-01-06 DIAGNOSIS — R531 Weakness: Secondary | ICD-10-CM | POA: Insufficient documentation

## 2018-01-06 DIAGNOSIS — M792 Neuralgia and neuritis, unspecified: Secondary | ICD-10-CM

## 2018-01-06 DIAGNOSIS — M069 Rheumatoid arthritis, unspecified: Secondary | ICD-10-CM | POA: Diagnosis not present

## 2018-01-06 DIAGNOSIS — R278 Other lack of coordination: Secondary | ICD-10-CM

## 2018-01-06 DIAGNOSIS — M7542 Impingement syndrome of left shoulder: Secondary | ICD-10-CM

## 2018-01-06 DIAGNOSIS — I69354 Hemiplegia and hemiparesis following cerebral infarction affecting left non-dominant side: Secondary | ICD-10-CM | POA: Insufficient documentation

## 2018-01-06 MED ORDER — GABAPENTIN 100 MG PO CAPS: 100.0000 mg | ORAL_CAPSULE | Freq: Three times a day (TID) | ORAL | 1 refills | Status: DC

## 2018-01-06 NOTE — Progress Notes (Signed)
Subjective:    Patient ID: Donna Rhodes, female    DOB: [birth year], 72 y.o.   MRN: [id number] 72 y.o.LH femalewith history of HTN, GERD, RA, lung nodule who was admitted to [facility] on 04/24/17 with multiple falls and MRI brain without acute findings and cervical spondylosis with central protrusion at C4/5. She was sent home and started developing left sided weakness withleft facial weakness and slurred speech. 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 MRA brain/neck was negative for occlusion or stenosis. 2D echo with EF 50-55% and grade 1 diastolic dysfunction. Stroke felt to be due to small vessel disease and Dr. Doy Mince recommended low dose ASA and plavix for secondary stroke prevention HPI Hand getting tighter again on left, patient's therapist has commented that her Botox may be wearing off  Last Botox injection 10/05/2017 Biceps50 FCR25 FCU0 FDS25 FDP25 FPL25 Brachioradialis 50  Continued shoulder pain history of remote fall with negative x-ray for fracture Pain is severe mainly when she is using or moving her left upper extremity.  Using give more sling that helps avoid left arm from dangling and hitting objects LEFT Glenohumeral injection on 09/07/2017 which is not helpful LEFT Glenohumeral injection on 08/06/2017 ultrasound guided LEFT subacromial injection Candidate for nonsteroidal secondary to history of CVA Not a candidate for opiates secondary to fall risk post CVA  Currently on Celexa therefore cannot try Cymbalta Is not tried gabapentin or Lyrica.  Left shoulder ultrasound on 11/16/2017 Impression 1.  Abnormal study 2.  Evidence of left supraspinatus partial and complete tears affecting the distal tendon area. 2.  Calcific tendinitis left subscapularis tendon 3.  Left acromioclavicular joint irregularity is consistent with osteoarthritis Pain Inventory Average Pain 2 Pain  Right Now 5 My pain is sharp and aching  In the last 24 hours, has pain interfered with the following? General activity 0 Relation with others 0 Enjoyment of life 0 What TIME of day is your pain at its worst? n/a Sleep (in general) Fair  Pain is worse with: some activites Pain improves with: therapy/exercise Relief from Meds: n/a  Mobility walk without assistance how many minutes can you walk? 6 Do you have any goals in this area?  yes  Function not employed: date last employed . retired  Neuro/Psych numbness tingling  Prior Studies Any changes since last visit?  no  Physicians involved in your care Any changes since last visit?  no   Family History  Problem Relation Age of Onset  . Hypertension Father   . Dementia Father   . Osteoarthritis Mother   . Arthritis/Rheumatoid Sister   . Breast cancer Unknown        paternal side  . Breast cancer Paternal Aunt   . Breast cancer Cousin        maternal  . Uterine cancer Maternal Grandmother   . Ovarian cancer Neg Hx   . Heart disease Neg Hx   . Diabetes Neg Hx   . Colon cancer Neg Hx    Social History   Socioeconomic History  . Marital status: Divorced    Spouse name: None  . Number of children: 3  . Years of education: 59  . Highest education level: None  Social Needs  . Financial resource strain: None  . Food insecurity - worry: None  . Food insecurity - inability: None  . Transportation needs - medical: None  . Transportation needs - non-medical: None  Occupational History    Comment: retired  Tobacco Use  . Smoking status: Former Smoker    Last attempt to quit: 12/29/1979    Years since quitting: 38.0  . Smokeless tobacco: Never Used  Substance and Sexual Activity  . Alcohol use: No    Alcohol/week: 0.0 oz  . Drug use: No  . Sexual activity: Not Currently    Birth control/protection: Surgical  Other Topics Concern  . None  Social History Narrative   Lives alone   Caffeine- 1 cup coffee    Past Surgical History:  Procedure Laterality Date  . ADENOIDECTOMY    . APPENDECTOMY  1965  . [place]   left  . CATARACT EXTRACTION W/PHACO Right 10/27/2017   Procedure: CATARACT EXTRACTION PHACO AND INTRAOCULAR LENS PLACEMENT ([place]) RIGHT;  Surgeon: Leandrew Koyanagi, MD;  Location: [place];  Service: Ophthalmology;  Laterality: Right;  requests early (8-8:30 arrival)  . CATARACT EXTRACTION W/PHACO Left 11/10/2017   Procedure: CATARACT EXTRACTION PHACO AND INTRAOCULAR LENS PLACEMENT ([place]) LEFT;  Surgeon: Leandrew Koyanagi, MD;  Location: [place];  Service: Ophthalmology;  Laterality: Left;  . CHOLECYSTECTOMY    . [place]  . laser vein surgery    . LOOP RECORDER INSERTION N/A 09/24/2017   Procedure: LOOP RECORDER INSERTION;  Surgeon: Evans Lance, MD;  Location: [place] [facility];  Service: Cardiovascular;  Laterality: N/A;  . MANDIBLE RECONSTRUCTION  1992  . TOENAIL EXCISION  1998   several  . [place]  . VAGINAL HYSTERECTOMY  1983   als0- anterior/posterior colporrhaphy  . VAGINAL WOUND CLOSURE / REPAIR  1999, 2000  . VAGINAL WOUND CLOSURE / REPAIR     Past Medical History:  Diagnosis Date  . Atrophic vaginitis   . Dysplasia of vagina    vin 1 - vulva and vaginal cuff  . GERD (gastroesophageal reflux disease)   . HCV infection   . HSV (herpes simplex virus) infection   . Hypercholesterolemia   . Hypertension   . Hypothyroidism   . Left-sided weakness    S/P CVA 4/18.  PT 2x/wk.  . Lung nodule    stable  . Menopausal state   . Osteoarthritis    hand involvement, cervical disc disease  . Osteoporosis   . Pelvic pain in female   . Rheumatoid arthritis ([place])   . Stroke (cerebrum) ([place]) 04/24/2017  . Thyroid nodule   . Vaginal Pap smear, abnormal    lgsil   BP 136/77 (BP Location: Right Arm, Patient Position: Sitting, Cuff Size: Normal)   Pulse 81   Resp 14   LMP 12/29/1981   SpO2 98%   Opioid  Risk Score:   Fall Risk Score:  `1  Depression screen PHQ 2/9  Depression screen [facility] 2/9 05/26/2017 10/23/2016 09/30/2015 05/31/2015 09/11/2014 05/29/2013  Decreased Interest 0 0 0 0 0 0  Down, Depressed, Hopeless 0 0 0 0 0 0  PHQ - 2 Score 0 0 0 0 0 0  Some recent data might be hidden    Review of Systems  Constitutional: Negative.   HENT: Negative.   Eyes: Negative.   Respiratory: Negative.   Cardiovascular: Negative.   Gastrointestinal: Positive for constipation.  Endocrine: Negative.   Genitourinary: Negative.   Musculoskeletal: Positive for arthralgias and gait problem.  Skin: Negative.   Allergic/Immunologic: Negative.   Neurological: Positive for numbness.       Tingling  Hematological: Negative.   Psychiatric/Behavioral: Negative.  Objective:   Physical Exam  Constitutional: She is oriented to person, place, and time. She appears well-developed and well-nourished.  HENT:  Head: Normocephalic and atraumatic.  Eyes: Conjunctivae and EOM are normal. Pupils are equal, round, and reactive to light.  Musculoskeletal:       Right shoulder: Normal.       Left shoulder: She exhibits decreased range of motion, tenderness, deformity and decreased strength. She exhibits no swelling, no effusion, no crepitus and no spasm.  Neurological: She is alert and oriented to person, place, and time.  Psychiatric: She has a normal mood and affect.  Nursing note and vitals reviewed.   2- biceps triceps and finger flexors, 0/5 shoulder abd 0/5 triceps, 5/5 in the left knee extensor 4 in the left hip flexor 5 in the left ankle dorsiflexor. Ambulates without evidence of toe drag or knee instability.  1 FB shoulder sublux Pain with external rotation of the left shoulder.  This is severe. Positive impingement sign at 80 degrees left shoulder Negative left hand swelling no vasomotor changes Tone in the shoulder girdle is normal    Assessment & Plan:  1.  Left spastic hemiplegia  secondary to right ganglia and posterior limb internal capsule infarcts. She has had good results with Botox injections will repeat in 2 weeks Biceps50 FCR25 FCU0 FDS25 FDP25 FPL25 Brachioradialis 50  2.  Left hemiplegic shoulder pain.  This is multifactorial.  She has rotator cuff tears as noted on muscular skeletal ultrasound examination.  She does have left shoulder subluxation due to weakness of her shoulder girdle musculature post stroke.  She does have a neuropathic component of her pain during to the chronicity of her complaints. Not a good candidate for opiates or for nonsteroidal anti-inflammatories.  Do not think this is a tone related issue so I do not think muscle relaxers would be of benefit. Will trial gabapentin 100 mg 3 times daily.  Will monitor for sedation and other side effects, patient states she is very sensitive to the side effect of medications. If this is not helpful may consider Lyrica.  Will assess at next visit.  Not a good candidate for Cymbalta secondary to being on Celexa for post stroke depression  Above medications would recommend percutaneous electrical nerve stimulation SPR Sprint therapeutic system.  The electrode inserted into the left deltoid area and connected to a stimulator placed over the left deltoid area as well.  Electrode would be indwelling for 60 days then removed.  That is published by Reynaldo Minium al have demonstrated continued pain relief at 1 year follow-up,  procedure can be done in the office.

## 2018-01-06 NOTE — Patient Instructions (Signed)
Left shoulder complete ultrasound  Patient examined in sitting position, left bicipital groove short axis view showed normal tendon with, no evidence of subluxation with external rotation. Scanned into the biceps muscle there is no evidence of hematoma or swelling around the biceps tendon insertion. Longest axis view at the bicipital groove shows intact biceps tendon minimal little amount of fluid around the tendon, 2 mm Left subscapularis shows irregularity at insertion site on short axis view as well as calcifications. Long axis view showed no overt tears difficult to get good positioning secondary to her shoulder contracture.  Left supraspinatus tendon long axis view shows distal articular surface and bursal surface anechoic areas.  These were also seen on short axis views to confirm both partial and complete tears.  Left infraspinatus tendon short axis view, tendon thickness is diminished however no evidence of tear seen. Left infraspinatus tendon short axis view no evidence of tear.  Left acromioclavicular joint has irregularities.  No capsular distention.  Impression 1.  Abnormal study 2.  Evidence of left supraspinatus partial and complete tears affecting the distal tendon area. 2.  Calcific tendinitis left subscapularis tendon 3.  Left acromioclavicular joint irregularity is consistent with osteoarthritis  Charlett Blake M.D. [facility] FAAPM&R (Sports Med, Neuromuscular Med) Diplomate Am Board of Electrodiagnostic Med

## 2018-01-06 NOTE — Therapy (Signed)
[place] MAIN [facility] SERVICES [address] [place], [place], [address] Phone: [phone]   Fax:  [phone]  Physical Therapy Treatment  Patient Details  Name: Donna Rhodes MRN: [id number] Date of Birth: [birth year] Referring Provider: Alysia Penna   Encounter Date: 01/06/2018  PT End of Session - 01/06/18 1021    Visit Number  26    Number of Visits  45    Date for PT Re-Evaluation  02/01/18    PT Start Time  2505    PT Stop Time  1100    PT Time Calculation (min)  45 min    Equipment Utilized During Treatment  Gait belt    Activity Tolerance  Patient tolerated treatment well;No increased pain    Behavior During Therapy  WFL for tasks assessed/performed       Past Medical History:  Diagnosis Date  . Atrophic vaginitis   . Dysplasia of vagina    vin 1 - vulva and vaginal cuff  . GERD (gastroesophageal reflux disease)   . HCV infection   . HSV (herpes simplex virus) infection   . Hypercholesterolemia   . Hypertension   . Hypothyroidism   . Left-sided weakness    S/P CVA 4/18.  PT 2x/wk.  . Lung nodule    stable  . Menopausal state   . Osteoarthritis    hand involvement, cervical disc disease  . Osteoporosis   . Pelvic pain in female   . Rheumatoid arthritis ([place])   . Stroke (cerebrum) ([place]) 04/24/2017  . Thyroid nodule   . Vaginal Pap smear, abnormal    lgsil    Past Surgical History:  Procedure Laterality Date  . ADENOIDECTOMY    . APPENDECTOMY  1965  . [place]   left  . CATARACT EXTRACTION W/PHACO Right 10/27/2017   Procedure: CATARACT EXTRACTION PHACO AND INTRAOCULAR LENS PLACEMENT ([place]) RIGHT;  Surgeon: Leandrew Koyanagi, MD;  Location: [place];  Service: Ophthalmology;  Laterality: Right;  requests early (8-8:30 arrival)  . CATARACT EXTRACTION W/PHACO Left 11/10/2017   Procedure: CATARACT EXTRACTION PHACO AND INTRAOCULAR LENS PLACEMENT ([place]) LEFT;  Surgeon: Leandrew Koyanagi, MD;  Location: [place];  Service: Ophthalmology;  Laterality: Left;  . CHOLECYSTECTOMY    . [place]  . laser vein surgery    . LOOP RECORDER INSERTION N/A 09/24/2017   Procedure: LOOP RECORDER INSERTION;  Surgeon: Evans Lance, MD;  Location: [place] [facility];  Service: Cardiovascular;  Laterality: N/A;  . MANDIBLE RECONSTRUCTION  1992  . TOENAIL EXCISION  1998   several  . [place]  . VAGINAL HYSTERECTOMY  1983   als0- anterior/posterior colporrhaphy  . VAGINAL WOUND CLOSURE / REPAIR  1999, 2000  . VAGINAL WOUND CLOSURE / REPAIR      There were no vitals filed for this visit.  Subjective Assessment - 01/06/18 1020    Subjective  Patient reports doing band exercises at home for her foot/ankle but states, "It seemed easier at home. I don't know if its because I didn't have the band tight enough."     Patient is accompained by:  Family member    Pertinent History  Pt is a 72 y.o female that presents to therapy s/p CVA; On April 24, 2017 pt suffered an acute infarct in right basal ganglia. She was in hospital 3 days; after pt was transferred to [facility] where she did inpatient therapy; Pt then completed home  PT until about 2 weeks ago; Pt has pain in shoulder when moving it with R UE and reports that deltoid is paralyzed; Pt fell out of bed one day and fell on L UE; Pt reported doctor put a shot in L shoulder and will receive 3 more shots in shoulder; Pt reports no sensation or numbness and tingling with LE just some sensation impaired in L hand; Pt has an air cast for L LE and splint for wrist but does not use because difficulty donning;  Pt has had trouble sleeping at night with waking and going to the bathroom but denies any pain;  Only has pain in shoulder when moving described as throbbing; Pt reports not using her cane in the last month and states her balance is fine without it;     Limitations  Lifting;Standing;Walking    How long can  you sit comfortably?  hours     How long can you stand comfortably?  30 min     How long can you walk comfortably?  better in the morning because more energy;     Diagnostic tests  x rays in shoulder and hand; all clear; doctor stated "inferior pseudo-subluxation from paralysis of the deltoid. she has advanced left hand arthritis"    Patient Stated Goals  walking better without the cane;     Currently in Pain?  No/denies    Pain Onset  More than a month ago           TREATMENT: Warm up on Nustep RUE/BLE level 2 x5 min (Unbilled);  Patient reports having trouble controlling LLE position when descending stairs; Instructed patient in eccentric step downs with each LE: x10 stepping down with RLE with cues to keep LLE knee in neutral position and avoid valgus/varus for better knee control; 2x10 stepping down with LLE with barrier to LLE foot to facilitate better hip abduction and mod VCs and tactile cues to facilitate ankle DF/EV for better heel strike;  Leg press: LLE only 45# 3x12 with cues to keep knee in neutral; advanced 2nd-3rd set with small incline under foot to challenge ankle control;   Sidelying: LLE quad stretch 20 sec hold x3 reps; LLE rolling stick soft tissue massage along quad/hamstring and IT band to reduce tightness/soreness x2 min; LLE clamshells green tband 2x15 with min A to hold positioning and mod VCs for positioning and to increase hip abduction to tolerance for better hip strengthening;  Standing with red band around BLE above knees: Mini squat with hip abduction clamshells LLE only 2x10 with mirror for visual cues for positioning; Side stepping with squat to facilitate better hip abductor strengthening x10 feet x2 laps each direction;    Sitting: LLE ankle DF, EV red tband 2x15 bilaterally; Patient required min VCs for correct positioning of band for better strengthening;      Patient educated on importance of motor control during exercise/ADLs to help  control LLE foot placement.  Patient denies any pain but reports mild fatigue at end of session                 PT Education - 01/06/18 1021    Education provided  Yes    Education Details  LE strengthening, stair negotiation safety;     Person(s) Educated  Patient    Methods  Explanation;Demonstration;Verbal cues    Comprehension  Verbalized understanding;Returned demonstration;Verbal cues required;Need further instruction          PT Long Term Goals - 01/04/18 1109  PT LONG TERM GOAL #1   Title  Pt will become independent with HEP in order to improve ADL independence;    Baseline  Pt performs several exercises frequently, but inconsistently performs other exercises.     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; improved to 1.25 m/s on 11/23/17    Time  12    Period  Weeks    Status  Achieved      PT LONG TERM GOAL #4   Title  Pt will improve 6MWT to >1300 feet in order to be considered 80% of age group norm and to improve activity tolerance and community ambulation distance for improved IADLs (revised from 1000")    Baseline  565 feet, improved to 875' on 8/28, improved to 1105' on 9/19; improved to 1245 on 11/23/17    Time  12    Period  Weeks    Status  Achieved      PT LONG TERM GOAL #5   Title  Pt will increase strength in L LE to 4+/5 for all muscles in order to improve mobility and  function with daily activities;     Baseline  pt is 4+/5 in all LE except for L ankle DF: 4/5    Time  12    Period  Weeks    Status  Achieved      PT LONG TERM GOAL #6   Title  Pt will ambulate with equal stride length, no toe drag on LLE, and equal stance and swing time bilaterally to demonstrate improved gait mechanics for more efficient and functional mobility.    Time  12    Period  Weeks    Status  Achieved      PT LONG TERM GOAL #7   Title  Patient will ascend/descend 4 stairs without rail assist independently without loss of balance to improve ability to get in/out of home while carrying things.      Baseline  requires 1 rail assist    Time  4    Period  Weeks    Status  New    Target Date  02/01/18            Plan - 01/06/18 1234    Clinical Impression Statement  Patient instructed in advanced LE strengthening for hip/knee and ankle to facilitate better control. She continues to have difficulty avoiding ankle IV with DF with cues for ankle EV to exhibit a better neutral foot position. patient reports increased fatigue at end of session; She denies any pain; She would benefit from additional skilled PT intervention to improve strength, balance and gait safety;  Rehab Potential  Fair    Clinical Impairments Affecting Rehab Potential  neg: age, lives alone, comorbidities, pos: wants to be more independent; already high functioning     PT Frequency  2x / week    PT Duration  8 weeks    PT Treatment/Interventions  ADLs/Self Care Home Management;Aquatic Therapy;Moist Heat;Cryotherapy;DME Instruction;Gait training;Stair training;Functional mobility training;Therapeutic activities;Therapeutic exercise;Balance training;Neuromuscular re-education;Manual techniques;Passive range of motion;Energy conservation;Electrical Stimulation;Patient/family education;Orthotic Fit/Training;Dry needling    PT Next Visit Plan  balance on unstable surfaces, stairs,     PT Home Exercise Plan   continue as given;     Consulted and Agree with Plan of Care  Patient       Patient will benefit from skilled therapeutic intervention in order to improve the following deficits and impairments:  Abnormal gait, Impaired sensation, Decreased mobility, Decreased activity tolerance, Decreased endurance, Decreased range of motion, Decreased strength, Impaired UE functional use, Decreased balance, Decreased safety awareness, Difficulty walking, Pain  Visit Diagnosis: Muscle weakness (generalized)  Other lack of coordination  Other abnormalities of gait and mobility     Problem List Patient Active Problem List   Diagnosis Date Noted  . Left arm pain 11/05/2017  . Adhesive capsulitis of left shoulder 08/06/2017  . Urinary frequency 07/11/2017  . Gait disturbance, post-stroke 05/31/2017  . Left spastic hemiparesis ([place]) 05/31/2017  . Adjustment disorder with mixed anxiety and depressed mood   . Hypokalemia   . HCAP (healthcare-associated pneumonia)   . Cough with fever   . 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 FUO (fever of unknown origin)   . Oral thrush   . Benign essential HTN   . Rheumatoid arthritis involving both hands ([place]) 04/28/2017  . Right-sided lacunar infarction 04/27/2017  . CVA (cerebral vascular accident) ([place]) 04/24/2017  . LGSIL Pap smear of vagina 12/15/2016  . Anal skin tag 02/02/2016  . Inflammatory arthritis 01/19/2016  . Right knee pain 01/19/2016  . Genital herpes 10/20/2015  . GERD (gastroesophageal reflux disease) 10/20/2015  . Status post vaginal hysterectomy 10/20/2015  . Health care maintenance 05/30/2015  . LLQ pain 03/31/2014  . Menopausal symptoms 03/31/2014  . Hypercholesterolemia 12/31/2012  . Hypothyroidism 12/29/2012  . Osteoarthritis 12/29/2012  . PULMONARY NODULE 01/02/2008  . Mild depression ([place]) 12/30/2007  . Essential hypertension 12/30/2007    Trotter,Margaret PT, DPT 01/06/2018, 12:39 PM  [place] MAIN [facility] SERVICES [address] [place], [place], [address] Phone: [phone]   Fax:  [phone]  Name: Donna Rhodes MRN: [id number] Date of Birth: [birth year]

## 2018-01-10 DIAGNOSIS — Z961 Presence of intraocular lens: Secondary | ICD-10-CM | POA: Diagnosis not present

## 2018-01-11 ENCOUNTER — Ambulatory Visit: Payer: PPO | Admitting: Physical Therapy

## 2018-01-11 ENCOUNTER — Encounter: Payer: Self-pay | Admitting: Physical Therapy

## 2018-01-11 ENCOUNTER — Encounter: Payer: Self-pay | Admitting: Occupational Therapy

## 2018-01-11 ENCOUNTER — Ambulatory Visit: Payer: PPO | Admitting: Occupational Therapy

## 2018-01-11 DIAGNOSIS — R278 Other lack of coordination: Secondary | ICD-10-CM

## 2018-01-11 DIAGNOSIS — M6281 Muscle weakness (generalized): Secondary | ICD-10-CM

## 2018-01-11 DIAGNOSIS — R2689 Other abnormalities of gait and mobility: Secondary | ICD-10-CM

## 2018-01-11 NOTE — Therapy (Signed)
[place] MAIN [facility] SERVICES [address] [place], [place], [address] Phone: [phone]   Fax:  [phone]  Physical Therapy Treatment  Patient Details  Name: Donna Rhodes MRN: [id number] Date of Birth: 03-May-1946 Referring Provider: Alysia Penna   Encounter Date: 01/11/2018  PT End of Session - 01/11/18 1045    Visit Number  27    Number of Visits  45    Date for PT Re-Evaluation  02/01/18    PT Start Time  1100    PT Stop Time  1145    PT Time Calculation (min)  45 min    Equipment Utilized During Treatment  Gait belt    Activity Tolerance  Patient tolerated treatment well;No increased pain    Behavior During Therapy  WFL for tasks assessed/performed       Past Medical History:  Diagnosis Date  . Atrophic vaginitis   . Dysplasia of vagina    vin 1 - vulva and vaginal cuff  . GERD (gastroesophageal reflux disease)   . HCV infection   . HSV (herpes simplex virus) infection   . Hypercholesterolemia   . Hypertension   . Hypothyroidism   . Left-sided weakness    S/P CVA 4/18.  PT 2x/wk.  . Lung nodule    stable  . Menopausal state   . Osteoarthritis    hand involvement, cervical disc disease  . Osteoporosis   . Pelvic pain in female   . Rheumatoid arthritis ([place])   . Stroke (cerebrum) ([place]) 04/24/2017  . Thyroid nodule   . Vaginal Pap smear, abnormal    lgsil    Past Surgical History:  Procedure Laterality Date  . ADENOIDECTOMY    . APPENDECTOMY  1965  . [place]   left  . CATARACT EXTRACTION W/PHACO Right 10/27/2017   Procedure: CATARACT EXTRACTION PHACO AND INTRAOCULAR LENS PLACEMENT ([place]) RIGHT;  Surgeon: Leandrew Koyanagi, MD;  Location: [place];  Service: Ophthalmology;  Laterality: Right;  requests early (8-8:30 arrival)  . CATARACT EXTRACTION W/PHACO Left 11/10/2017   Procedure: CATARACT EXTRACTION PHACO AND INTRAOCULAR LENS PLACEMENT ([place]) LEFT;  Surgeon: Leandrew Koyanagi, MD;  Location: [place];  Service: Ophthalmology;  Laterality: Left;  . CHOLECYSTECTOMY    . [place]  . laser vein surgery    . LOOP RECORDER INSERTION N/A 09/24/2017   Procedure: LOOP RECORDER INSERTION;  Surgeon: Evans Lance, MD;  Location: [place] [facility];  Service: Cardiovascular;  Laterality: N/A;  . MANDIBLE RECONSTRUCTION  1992  . TOENAIL EXCISION  1998   several  . [place]  . VAGINAL HYSTERECTOMY  1983   als0- anterior/posterior colporrhaphy  . VAGINAL WOUND CLOSURE / REPAIR  1999, 2000  . VAGINAL WOUND CLOSURE / REPAIR      There were no vitals filed for this visit.  Subjective Assessment - 01/11/18 1113    Subjective  Patient reports that the doctor prescribed gabapentin for pain relief; She just started it. She reports having a lot of pain yesterday in her LUE but denies any pain currently; Reports doing the band exercise some, but is still having a hard time;     Patient is accompained by:  Family member    Pertinent History  Pt is a 72 y.o female that presents to therapy s/p CVA; On April 24, 2017 pt suffered an acute infarct in right basal ganglia. She was in hospital 3 days; after pt was  transferred to [facility] where she did inpatient therapy; Pt then completed home PT until about 2 weeks ago; Pt has pain in shoulder when moving it with R UE and reports that deltoid is paralyzed; Pt fell out of bed one day and fell on L UE; Pt reported doctor put a shot in L shoulder and will receive 3 more shots in shoulder; Pt reports no sensation or numbness and tingling with LE just some sensation impaired in L hand; Pt has an air cast for L LE and splint for wrist but does not use because difficulty donning;  Pt has had trouble sleeping at night with waking and going to the bathroom but denies any pain;  Only has pain in shoulder when moving described as throbbing; Pt reports not using her cane in the last month and states her balance is  fine without it;     Limitations  Lifting;Standing;Walking    How long can you sit comfortably?  hours     How long can you stand comfortably?  30 min     How long can you walk comfortably?  better in the morning because more energy;     Diagnostic tests  x rays in shoulder and hand; all clear; doctor stated "inferior pseudo-subluxation from paralysis of the deltoid. she has advanced left hand arthritis"    Patient Stated Goals  walking better without the cane;     Currently in Pain?  No/denies                  TREATMENT: Warm up on Nustep RUE/BLE level 2 x5 min (Unbilled);  Patient reports having trouble controlling LLE position when descending stairs; Instructed patient in eccentric step downs with each LE: 2x10 stepping down with RLE with cues to keep LLE knee in neutral position and avoid valgus/varus for better knee control; 2x10 stepping down with LLE with barrier to LLE foot to facilitate better hip abduction and mod VCs and tactile cues to facilitate ankle DF/EV for better heel strike;  Standing: Hamstring stretch with ankle DF 20 sec hold x2 reps bilaterally; Heel off step calf stretch 20 sec hold x2 reps bilaterally; Patient required min-moderate verbal/tactile cues for correct exercise technique including cues for positioning;   Leg press: LLE only 45# 3x12 with cues to keep knee in neutral;   Long Sitting: LLE ankle DF red tband 2x15 x2 setsbilaterally; Patient required min VCs for correct positioning of band for better strengthening;  LLE ankle EV yellow tband 2x15 x2 sets with tactile/visual cues to increase ROM for better ankle strengthening; Patient unable to achieve full ROM with red tband due to weakness;  Standing on 1/2 bolster: Feet apart, heel toe/raise x15 with cues to increase LLE knee extension and shift weight to left side for better calf stretch; Stepping over 1/2 bolster with cues for ankle DF at heel strike using mirror for visual cues to  improve ankle mobility and foot clearance x10 reps each side with 1 rail assist;    Patient educated on importance of motor control during exercise/ADLs to help control LLE foot placement. Also reinforced importance of HEP adherence;  Patient denies any pain but reports mild fatigue at end of session               PT Education - 01/11/18 1114    Education provided  Yes    Education Details  LE strengthening, stair negotiation safety;     Person(s) Educated  Patient  Methods  Explanation;Demonstration;Verbal cues    Comprehension  Verbalized understanding;Returned demonstration;Verbal cues required;Need further instruction          PT Long Term Goals - 01/04/18 1109      PT LONG TERM GOAL #1   Title  Pt will become independent with HEP in order to improve ADL independence;    Baseline  Pt performs several exercises frequently, but inconsistently performs other exercises.     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; improved to 1.25 m/s on 11/23/17    Time  12    Period  Weeks    Status  Achieved      PT LONG TERM GOAL #4   Title  Pt will improve 6MWT to >1300 feet in order to be considered 80% of age group norm and to improve activity tolerance and community ambulation distance for improved IADLs (revised from 1000")    Baseline  565 feet, improved to 875' on 8/28, improved to 1105' on 9/19;  improved to 1245 on 11/23/17    Time  12    Period  Weeks    Status  Achieved      PT LONG TERM GOAL #5   Title  Pt will increase strength in L LE to 4+/5 for all muscles in order to improve mobility and function with daily activities;     Baseline  pt is 4+/5 in all LE except for L ankle DF: 4/5    Time  12    Period  Weeks    Status  Achieved      PT LONG TERM GOAL #6   Title  Pt will ambulate with equal stride length, no toe drag on LLE, and equal stance and swing time bilaterally to demonstrate improved gait mechanics for more efficient and functional mobility.    Time  12    Period  Weeks    Status  Achieved      PT LONG TERM GOAL #7   Title  Patient will ascend/descend 4 stairs without rail assist independently without loss of balance to improve ability to get in/out of home while carrying things.      Baseline  requires 1 rail assist    Time  4    Period  Weeks    Status  New    Target Date  02/01/18            Plan - 01/11/18 1123    Clinical Impression Statement  Patient instructed in advanced LLE ankle/knee strengthening to challenge control and reduce ankle IV with foot clearance; Patient admits that she has not been adherent to  HEP; PT reinforced importance of HEP adherence for progression of strengthening and to reduce ankle IV at heel strike during ambulation; Instructed patient in advanced LLE quad control exercise with eccentric step downs, and stepping over bolster for better LE control; Patient reports increased fatigue at end of session; She denies any increase in pain; She would benefit from additional skilled PT intervention to improve strength, balance and gait safety;     Rehab Potential  Fair    Clinical Impairments Affecting Rehab Potential  neg: age, lives alone, comorbidities, pos: wants to be more independent; already high functioning     PT Frequency  2x / week    PT Duration  8 weeks    PT Treatment/Interventions  ADLs/Self Care Home  Management;Aquatic Therapy;Moist Heat;Cryotherapy;DME Instruction;Gait training;Stair training;Functional mobility training;Therapeutic activities;Therapeutic exercise;Balance training;Neuromuscular re-education;Manual techniques;Passive range of motion;Energy conservation;Electrical Stimulation;Patient/family education;Orthotic Fit/Training;Dry needling    PT Next Visit Plan  balance on unstable surfaces, stairs,     PT Home Exercise Plan  continue as given;     Consulted and Agree with Plan of Care  Patient       Patient will benefit from skilled therapeutic intervention in order to improve the following deficits and impairments:  Abnormal gait, Impaired sensation, Decreased mobility, Decreased activity tolerance, Decreased endurance, Decreased range of motion, Decreased strength, Impaired UE functional use, Decreased balance, Decreased safety awareness, Difficulty walking, Pain  Visit Diagnosis: Muscle weakness (generalized)  Other lack of coordination  Other abnormalities of gait and mobility     Problem List Patient Active Problem List   Diagnosis Date Noted  . Left arm pain 11/05/2017  . Adhesive capsulitis of left shoulder 08/06/2017  . Urinary frequency 07/11/2017  . Gait disturbance, post-stroke 05/31/2017  . Left spastic hemiparesis ([place]) 05/31/2017  . Adjustment disorder with mixed anxiety and depressed mood   . Hypokalemia   . HCAP (healthcare-associated pneumonia)   . Cough with fever   . 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 FUO (fever of unknown origin)   . Oral thrush   . Benign essential HTN   . Rheumatoid arthritis involving both hands ([place]) 04/28/2017  . Right-sided lacunar infarction 04/27/2017  . CVA (cerebral vascular accident) ([place]) 04/24/2017  . LGSIL Pap smear of vagina 12/15/2016  . Anal skin tag 02/02/2016  . Inflammatory arthritis 01/19/2016  . Right knee pain 01/19/2016  . Genital herpes 10/20/2015  . GERD (gastroesophageal reflux  disease) 10/20/2015  . Status post vaginal hysterectomy 10/20/2015  . Health care maintenance 05/30/2015  . LLQ pain 03/31/2014  . Menopausal symptoms 03/31/2014  . Hypercholesterolemia 12/31/2012  . Hypothyroidism 12/29/2012  . Osteoarthritis 12/29/2012  . PULMONARY NODULE 01/02/2008  . Mild depression ([place]) 12/30/2007  . Essential hypertension 12/30/2007    Kathyann Spaugh PT, DPT 01/11/2018, 11:52 AM  [place] MAIN [facility] SERVICES [address] [place], [place], [address] Phone: [phone]   Fax:  [phone]  Name: Donna Rhodes MRN: [id number] Date of Birth: [birth year]

## 2018-01-11 NOTE — Therapy (Signed)
[place] MAIN [facility] SERVICES [address] [place], [place], [address] Phone: [phone]   Fax:  [phone]  Occupational Therapy Treatment  Patient Details  Name: Donna Rhodes MRN: [id number] Date of Birth: [birth year] Referring Provider: Alysia Penna   Encounter Date: 01/11/2018  OT End of Session - 01/11/18 1141    Visit Number  32    Number of Visits  26    Date for OT Re-Evaluation  03/29/18    OT Start Time  1045    OT Stop Time  1100    OT Time Calculation (min)  15 min    Activity Tolerance  Patient tolerated treatment well    Behavior During Therapy  [facility] for tasks assessed/performed       Past Medical History:  Diagnosis Date  . Atrophic vaginitis   . Dysplasia of vagina    vin 1 - vulva and vaginal cuff  . GERD (gastroesophageal reflux disease)   . HCV infection   . HSV (herpes simplex virus) infection   . Hypercholesterolemia   . Hypertension   . Hypothyroidism   . Left-sided weakness    S/P CVA 4/18.  PT 2x/wk.  . Lung nodule    stable  . Menopausal state   . Osteoarthritis    hand involvement, cervical disc disease  . Osteoporosis   . Pelvic pain in female   . Rheumatoid arthritis ([place])   . Stroke (cerebrum) ([place]) 04/24/2017  . Thyroid nodule   . Vaginal Pap smear, abnormal    lgsil    Past Surgical History:  Procedure Laterality Date  . ADENOIDECTOMY    . APPENDECTOMY  1965  . [place]   left  . CATARACT EXTRACTION W/PHACO Right 10/27/2017   Procedure: CATARACT EXTRACTION PHACO AND INTRAOCULAR LENS PLACEMENT ([place]) RIGHT;  Surgeon: Leandrew Koyanagi, MD;  Location: [place];  Service: Ophthalmology;  Laterality: Right;  requests early (8-8:30 arrival)  . CATARACT EXTRACTION W/PHACO Left 11/10/2017   Procedure: CATARACT EXTRACTION PHACO AND INTRAOCULAR LENS PLACEMENT ([place]) LEFT;  Surgeon: Leandrew Koyanagi, MD;  Location: [place];  Service: Ophthalmology;   Laterality: Left;  . CHOLECYSTECTOMY    . [place]  . laser vein surgery    . LOOP RECORDER INSERTION N/A 09/24/2017   Procedure: LOOP RECORDER INSERTION;  Surgeon: Evans Lance, MD;  Location: [place] [facility];  Service: Cardiovascular;  Laterality: N/A;  . MANDIBLE RECONSTRUCTION  1992  . TOENAIL EXCISION  1998   several  . [place]  . VAGINAL HYSTERECTOMY  1983   als0- anterior/posterior colporrhaphy  . VAGINAL WOUND CLOSURE / REPAIR  1999, 2000  . VAGINAL WOUND CLOSURE / REPAIR      There were no vitals filed for this visit.  Subjective Assessment - 01/11/18 1138    Subjective   Pt. was 30 min. late for the appointment today.    Patient is accompained by:  Family member    Pertinent History  Pt. is a 72 y.o. female who had a CVA on 04/24/2017. Pt. went to the ER when she started not feeling well, and having heart palpitations. Pt. reports having work up initially, however was sent home. Pt. reports later in the day she was unable to write with her left hand. Pt. returned to the ER, and was admitted with workup for a CVA. Pt. was admitted to [facility], and stayed for approximately 3 weeks. Pt.  returned home alone, and  received home health services for the past 2 months. Pt. now presents for Outpatient OT services. Pt. resides home alone, and has support children who stop by daily, assist with shopping, transportation, and  ADL/IADL tasks as needed.     Currently in Pain?  No/denies      OT TREATMENT    Selfcare:  Pt. worked on zipping her jacket. Pt. education was provided about zipper modifications, Pt. was provided with a zipper pull. Pt. was provided with dycem to help stabilize the  Fabric in her hand. Pt. Was able to zip jacket successfully with these modifications, increased time, and cues for position.                        OT Education - 01/11/18 1140    Education provided  Yes    Education Details  UE  dressing    Person(s) Educated  Patient    Methods  Explanation;Demonstration;Verbal cues    Comprehension  Verbalized understanding;Returned demonstration;Verbal cues required;Need further instruction          OT Long Term Goals - 01/04/18 1149      OT LONG TERM GOAL #1   Title  Pt. will tolerate  shoulder PROM with pain improved to under 3/10    Baseline  Limited by pain    Time  12    Status  On-going    Target Date  03/29/18      OT LONG TERM GOAL #2   Title  Pt. will formulate a full fist to be able to stabilize objects with her Left hand while using her right hand.     Baseline  limited    Time  12    Period  Weeks    Status  On-going    Target Date  03/29/18      OT LONG TERM GOAL #3   Title  Pt. will be able to wipe face independently with her Left UE.    Baseline  Pt. is unable 10/11/2017: hand-over hand assist required    Time  12    Status  On-going    Target Date  03/29/18      OT LONG TERM GOAL #4   Title  Pt. will demonstrate independence with splint application.    Baseline  Pt. is unable to donn splint 10/11/2017: assist required    Time  12    Period  Weeks    Status  On-going    Target Date  03/29/18      OT LONG TERM GOAL #5   Title  Pt. will initiate active grasp release with thumb to 2nd digit 100% of the time to be able to hold a utensil in preparation for self-feeding.    Baseline  Minimal thumb movement. 10/11/2018: Pt. conitnues to have limited thumb motion.    Time  12    Period  Weeks    Status  On-going    Target Date  03/29/18      OT LONG TERM GOAL #6   Title  Pt. will initiate wrist extension in preparation for functional reaching.    Baseline  No active wrist extension. 10/11/2018: inconsistently initiating wrist extension.    Time  12    Period  Weeks    Status  On-going    Target Date  03/29/18            Plan - 01/11/18 1141  Clinical Impression Statement  Pt. reports seeing Dr. Letta Pate yesterday. Pt. reports her  insurance did not approve the percuntaneous stimulator that she was supposed to have inserted last month. Pt. has difficulty stabilizing her jacket with her left hand, while managing the zipper with her right hand.  Pt. was able to manipulate the zipper with modifications.    Occupational performance deficits (Please refer to evaluation for details):  ADL's;IADL's    Rehab Potential  Good    OT Frequency  2x / week    OT Duration  12 weeks    OT Treatment/Interventions  Self-care/ADL training;Therapeutic exercise;Patient/family education;DME and/or AE instruction;Therapeutic activities    Clinical Decision Making  Several treatment options, min-mod task modification necessary    Consulted and Agree with Plan of Care  Patient       Patient will benefit from skilled therapeutic intervention in order to improve the following deficits and impairments:  Decreased cognition, Difficulty walking, Decreased balance, Decreased strength, Decreased safety awareness, Decreased coordination, Impaired UE functional use, Impaired tone, Decreased range of motion, Decreased endurance, Decreased activity tolerance, Impaired perceived functional ability, Pain  Visit Diagnosis: Muscle weakness (generalized)  Other lack of coordination    Problem List Patient Active Problem List   Diagnosis Date Noted  . Left arm pain 11/05/2017  . Adhesive capsulitis of left shoulder 08/06/2017  . Urinary frequency 07/11/2017  . Gait disturbance, post-stroke 05/31/2017  . Left spastic hemiparesis ([place]) 05/31/2017  . Adjustment disorder with mixed anxiety and depressed mood   . Hypokalemia   . HCAP (healthcare-associated pneumonia)   . Cough with fever   . 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 FUO (fever of unknown origin)   . Oral thrush   . Benign essential HTN   . Rheumatoid arthritis involving both hands ([place]) 04/28/2017  . Right-sided lacunar infarction 04/27/2017  . CVA (cerebral vascular accident)  ([place]) 04/24/2017  . LGSIL Pap smear of vagina 12/15/2016  . Anal skin tag 02/02/2016  . Inflammatory arthritis 01/19/2016  . Right knee pain 01/19/2016  . Genital herpes 10/20/2015  . GERD (gastroesophageal reflux disease) 10/20/2015  . Status post vaginal hysterectomy 10/20/2015  . Health care maintenance 05/30/2015  . LLQ pain 03/31/2014  . Menopausal symptoms 03/31/2014  . Hypercholesterolemia 12/31/2012  . Hypothyroidism 12/29/2012  . Osteoarthritis 12/29/2012  . PULMONARY NODULE 01/02/2008  . Mild depression ([place]) 12/30/2007  . Essential hypertension 12/30/2007    Harrel Carina, MS, OTR/L 01/11/2018, 11:49 AM  [place] MAIN [facility] SERVICES [address] [place], [place], [address] Phone: [phone]   Fax:  [phone]  Name: Donna Rhodes MRN: [id number] Date of Birth: [birth year]

## 2018-01-12 DIAGNOSIS — M0609 Rheumatoid arthritis without rheumatoid factor, multiple sites: Secondary | ICD-10-CM | POA: Diagnosis not present

## 2018-01-12 DIAGNOSIS — Z79899 Other long term (current) drug therapy: Secondary | ICD-10-CM | POA: Diagnosis not present

## 2018-01-12 DIAGNOSIS — I6389 Other cerebral infarction: Secondary | ICD-10-CM | POA: Diagnosis not present

## 2018-01-13 ENCOUNTER — Ambulatory Visit: Payer: PPO | Admitting: Occupational Therapy

## 2018-01-13 ENCOUNTER — Encounter: Payer: Self-pay | Admitting: Occupational Therapy

## 2018-01-13 ENCOUNTER — Encounter: Payer: Self-pay | Admitting: Physical Therapy

## 2018-01-13 ENCOUNTER — Ambulatory Visit: Payer: PPO | Admitting: Physical Therapy

## 2018-01-13 DIAGNOSIS — M6281 Muscle weakness (generalized): Secondary | ICD-10-CM

## 2018-01-13 DIAGNOSIS — R2689 Other abnormalities of gait and mobility: Secondary | ICD-10-CM

## 2018-01-13 DIAGNOSIS — R278 Other lack of coordination: Secondary | ICD-10-CM

## 2018-01-13 NOTE — Therapy (Signed)
[place] MAIN [facility] SERVICES [address] [place], [place], [address] Phone: [phone]   Fax:  [phone]  Physical Therapy Treatment  Patient Details  Name: Donna Rhodes MRN: [id number] Date of Birth: [birth year] Referring Provider: Alysia Penna   Encounter Date: 01/13/2018  PT End of Session - 01/13/18 1156    Visit Number  28    Number of Visits  45    Date for PT Re-Evaluation  02/01/18    PT Start Time  1117    PT Stop Time  1158    PT Time Calculation (min)  41 min    Equipment Utilized During Treatment  Gait belt    Activity Tolerance  Patient tolerated treatment well;No increased pain    Behavior During Therapy  WFL for tasks assessed/performed       Past Medical History:  Diagnosis Date  . Atrophic vaginitis   . Dysplasia of vagina    vin 1 - vulva and vaginal cuff  . GERD (gastroesophageal reflux disease)   . HCV infection   . HSV (herpes simplex virus) infection   . Hypercholesterolemia   . Hypertension   . Hypothyroidism   . Left-sided weakness    S/P CVA 4/18.  PT 2x/wk.  . Lung nodule    stable  . Menopausal state   . Osteoarthritis    hand involvement, cervical disc disease  . Osteoporosis   . Pelvic pain in female   . Rheumatoid arthritis ([place])   . Stroke (cerebrum) ([place]) 04/24/2017  . Thyroid nodule   . Vaginal Pap smear, abnormal    lgsil    Past Surgical History:  Procedure Laterality Date  . ADENOIDECTOMY    . APPENDECTOMY  1965  . [place]   left  . CATARACT EXTRACTION W/PHACO Right 10/27/2017   Procedure: CATARACT EXTRACTION PHACO AND INTRAOCULAR LENS PLACEMENT ([place]) RIGHT;  Surgeon: Leandrew Koyanagi, MD;  Location: [place];  Service: Ophthalmology;  Laterality: Right;  requests early (8-8:30 arrival)  . CATARACT EXTRACTION W/PHACO Left 11/10/2017   Procedure: CATARACT EXTRACTION PHACO AND INTRAOCULAR LENS PLACEMENT ([place]) LEFT;  Surgeon: Leandrew Koyanagi, MD;  Location: [place];  Service: Ophthalmology;  Laterality: Left;  . CHOLECYSTECTOMY    . [place]  . laser vein surgery    . LOOP RECORDER INSERTION N/A 09/24/2017   Procedure: LOOP RECORDER INSERTION;  Surgeon: Evans Lance, MD;  Location: [place] [facility];  Service: Cardiovascular;  Laterality: N/A;  . MANDIBLE RECONSTRUCTION  1992  . TOENAIL EXCISION  1998   several  . [place]  . VAGINAL HYSTERECTOMY  1983   als0- anterior/posterior colporrhaphy  . VAGINAL WOUND CLOSURE / REPAIR  1999, 2000  . VAGINAL WOUND CLOSURE / REPAIR      There were no vitals filed for this visit.  Subjective Assessment - 01/13/18 1152    Subjective  Patient reports increased right upper trap discomfort and continued LUE pain; She reports, "I slept with a heating pad last night and I still got up a few times." "My eyes are bothering me too but the doctor says that the pressure and everything is fine."     Patient is accompained by:  Family member    Pertinent History  Pt is a 72 y.o female that presents to therapy s/p CVA; On April 24, 2017 pt suffered an acute infarct in right basal ganglia. She was in hospital 3 days;  after pt was transferred to [facility] where she did inpatient therapy; Pt then completed home PT until about 2 weeks ago; Pt has pain in shoulder when moving it with R UE and reports that deltoid is paralyzed; Pt fell out of bed one day and fell on L UE; Pt reported doctor put a shot in L shoulder and will receive 3 more shots in shoulder; Pt reports no sensation or numbness and tingling with LE just some sensation impaired in L hand; Pt has an air cast for L LE and splint for wrist but does not use because difficulty donning;  Pt has had trouble sleeping at night with waking and going to the bathroom but denies any pain;  Only has pain in shoulder when moving described as throbbing; Pt reports not using her cane in the last month and states her  balance is fine without it;     Limitations  Lifting;Standing;Walking    How long can you sit comfortably?  hours     How long can you stand comfortably?  30 min     How long can you walk comfortably?  better in the morning because more energy;     Diagnostic tests  x rays in shoulder and hand; all clear; doctor stated "inferior pseudo-subluxation from paralysis of the deltoid. she has advanced left hand arthritis"    Patient Stated Goals  walking better without the cane;     Currently in Pain?  Yes    Pain Score  5     Pain Location  Neck    Pain Orientation  Right    Pain Descriptors / Indicators  Aching;Sore;Tender;Tightness    Pain Type  Acute pain    Pain Radiating Towards  radiating towards right shoulder;     Pain Onset  In the past 7 days    Pain Frequency  Intermittent    Aggravating Factors   worse with activity of RUE;     Pain Relieving Factors  less pain after heat/stretch/linament    Effect of Pain on Daily Activities  decreased activity tolerance;     Multiple Pain Sites  No                TREATMENT: Warm up on Nustep RUE/BLE level 2 x5 min (Unbilled);  Patient supine: LLE SLR hamstring stretch 20 sec hold x2; LLE hamstring neural stretch with hip/knee flexion/extension x5 sec hold x5 reps; LLE SLR hamstring stretch with ankle DF/PF heel pump for neural stretch/flossing x10 reps;  Right sidelying; LLE quad/hip flexor stretch 20 sec hold x2 reps with cues to relax for better hip stretch;   Long sitting: PT performed grade II-III PA/AP mobs to talocrual joint to facilitate better ankle mobility 10 sec bouts x4 reps; Passive LLE ankle DF with knee extension for calf stretch 20 sec hold x2 reps; Patient reports less stiffness and no pain in ankle after joint mobs;   Patient reports having trouble controlling LLE position when descending stairs; Instructed patient ineccentric step downs with each LE: 2x10 stepping down with RLE with cues to keep LLE knee  in neutral position and avoid valgus/varus for better knee control; 2x10 stepping down with LLE with barrier to LLE foot to facilitate better hip abduction and mod VCs and tactile cues to facilitate ankle DF/EV for better heel strike;  Leg press: LLE only 45# 2x15 with cues to keep knee in neutral;   Long Sitting: LLE ankle DF red tband3x15; Patient required min VCs for  correct positioning of band for better strengthening; LLE ankle EV yellow tband 3x15 with tactile/visual cues to increase ROM for better ankle strengthening; Patient unable to achieve full ROM with red tband due to weakness;  Patient educated on importance of motor control during exercise/ADLs to help control LLE foot placement. Also reinforced importance of HEP adherence;  Patient denies any pain but reports mild fatigue at end of session                PT Education - 01/13/18 1156    Education provided  Yes    Education Details  LLE strengthening, ankle control;     Person(s) Educated  Patient    Methods  Explanation;Demonstration;Verbal cues    Comprehension  Verbalized understanding;Returned demonstration;Verbal cues required;Need further instruction          PT Long Term Goals - 01/04/18 1109      PT LONG TERM GOAL #1   Title  Pt will become independent with HEP in order to improve ADL independence;    Baseline  Pt performs several exercises frequently, but inconsistently performs other exercises.     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 on 8/28; improved to 1.25 m/s on 11/23/17    Time  12    Period  Weeks    Status  Achieved      PT LONG TERM GOAL #4   Title  Pt will improve 6MWT to >1300 feet in order to be considered 80% of age group norm and to improve activity tolerance and community ambulation distance for improved IADLs (revised from 1000")    Baseline  565 feet, improved to 875' on 8/28, improved to 1105' on 9/19; improved to 1245 on 11/23/17    Time  12    Period  Weeks    Status  Achieved      PT LONG TERM GOAL #5   Title  Pt will increase strength in L LE to 4+/5 for all muscles in order to improve mobility and function with daily activities;     Baseline  pt is 4+/5 in all LE except for L ankle DF: 4/5    Time  12    Period  Weeks    Status  Achieved      PT LONG TERM GOAL #6   Title  Pt will ambulate with equal stride length, no toe drag on LLE, and equal stance and swing time bilaterally to demonstrate improved gait mechanics for more efficient and functional mobility.    Time  12    Period  Weeks    Status  Achieved  PT LONG TERM GOAL #7   Title  Patient will ascend/descend 4 stairs without rail assist independently without loss of balance to improve ability to get in/out of home while carrying things.      Baseline  requires 1 rail assist    Time  4    Period  Weeks    Status  New    Target Date  02/01/18            Plan - 01/13/18 1158    Clinical Impression Statement  patient instructed in advanced LLE ankle/hip strengthening; PT performed stretches at start of sessoin to see if that would help reduce LUE discomfort during exercise. However despite stretches, she continues to have flexor tone/discomfort in LUE during LLE ankle strengthening; Patient reports less discomfort after exercise. She was able to exhibit better ankle control with step today as compared to previous  session; She would benefit from additional skilled PT intervention to improve strength, balance and gait safety;     Rehab Potential  Fair    Clinical Impairments Affecting Rehab Potential  neg: age, lives alone, comorbidities, pos: wants to be more independent; already high functioning     PT Frequency  2x / week    PT Duration  8 weeks    PT Treatment/Interventions  ADLs/Self Care Home Management;Aquatic Therapy;Moist Heat;Cryotherapy;DME Instruction;Gait training;Stair training;Functional mobility training;Therapeutic activities;Therapeutic exercise;Balance training;Neuromuscular re-education;Manual techniques;Passive range of motion;Energy conservation;Electrical Stimulation;Patient/family education;Orthotic Fit/Training;Dry needling    PT Next Visit Plan  balance on unstable surfaces, stairs,     PT Home Exercise Plan  continue as given;     Consulted and Agree with Plan of Care  Patient       Patient will benefit from skilled therapeutic intervention in order to improve the following deficits and impairments:  Abnormal gait, Impaired sensation, Decreased mobility, Decreased activity tolerance, Decreased endurance, Decreased range of motion, Decreased strength, Impaired UE functional use, Decreased balance, Decreased safety awareness, Difficulty walking, Pain  Visit Diagnosis: Muscle weakness (generalized)  Other lack of coordination  Other abnormalities of gait and mobility     Problem List Patient Active Problem List   Diagnosis Date Noted  . Left arm pain 11/05/2017  . Adhesive capsulitis of left shoulder 08/06/2017  . Urinary frequency 07/11/2017  . Gait disturbance, post-stroke 05/31/2017  . Left spastic hemiparesis ([place]) 05/31/2017  . Adjustment disorder with mixed anxiety and depressed mood   . Hypokalemia   . HCAP (healthcare-associated pneumonia)   . Cough with fever   . 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 FUO (fever of unknown origin)   . Oral thrush    . Benign essential HTN   . Rheumatoid arthritis involving both hands ([place]) 04/28/2017  . Right-sided lacunar infarction 04/27/2017  . CVA (cerebral vascular accident) ([place]) 04/24/2017  . LGSIL Pap smear of vagina 12/15/2016  . Anal skin tag 02/02/2016  . Inflammatory arthritis 01/19/2016  . Right knee pain 01/19/2016  . Genital herpes 10/20/2015  . GERD (gastroesophageal reflux disease) 10/20/2015  . Status post vaginal hysterectomy 10/20/2015  . Health care maintenance 05/30/2015  . LLQ pain 03/31/2014  . Menopausal symptoms 03/31/2014  . Hypercholesterolemia 12/31/2012  . Hypothyroidism 12/29/2012  . Osteoarthritis 12/29/2012  . PULMONARY NODULE 01/02/2008  . Mild depression ([place]) 12/30/2007  . Essential hypertension 12/30/2007    Trotter,Margaret PT, DPT 01/13/2018, 11:59 AM  [place] MAIN [facility] SERVICES [address] [place], [place], [address] Phone: [phone]  Fax:  [phone]  Name: Donna Rhodes MRN: [id number] Date of Birth: 29-Mar-1946

## 2018-01-13 NOTE — Therapy (Signed)
[place] MAIN [facility] SERVICES [address] [place], [place], [address] Phone: [phone]   Fax:  [phone]  Occupational Therapy Treatment  Patient Details  Name: Donna Rhodes MRN: [id number] Date of Birth: January 10, 1946 Referring Provider: Alysia Penna   Encounter Date: 01/13/2018  OT End of Session - 01/13/18 1149    Visit Number  33    Number of Visits  37    Date for OT Re-Evaluation  03/29/18    OT Start Time  1023    OT Stop Time  1100    OT Time Calculation (min)  37 min    Activity Tolerance  Patient tolerated treatment well    Behavior During Therapy  [facility] for tasks assessed/performed       Past Medical History:  Diagnosis Date  . Atrophic vaginitis   . Dysplasia of vagina    vin 1 - vulva and vaginal cuff  . GERD (gastroesophageal reflux disease)   . HCV infection   . HSV (herpes simplex virus) infection   . Hypercholesterolemia   . Hypertension   . Hypothyroidism   . Left-sided weakness    S/P CVA 4/18.  PT 2x/wk.  . Lung nodule    stable  . Menopausal state   . Osteoarthritis    hand involvement, cervical disc disease  . Osteoporosis   . Pelvic pain in female   . Rheumatoid arthritis ([place])   . Stroke (cerebrum) ([place]) 04/24/2017  . Thyroid nodule   . Vaginal Pap smear, abnormal    lgsil    Past Surgical History:  Procedure Laterality Date  . ADENOIDECTOMY    . APPENDECTOMY  1965  . [place]   left  . CATARACT EXTRACTION W/PHACO Right 10/27/2017   Procedure: CATARACT EXTRACTION PHACO AND INTRAOCULAR LENS PLACEMENT ([place]) RIGHT;  Surgeon: Leandrew Koyanagi, MD;  Location: [place];  Service: Ophthalmology;  Laterality: Right;  requests early (8-8:30 arrival)  . CATARACT EXTRACTION W/PHACO Left 11/10/2017   Procedure: CATARACT EXTRACTION PHACO AND INTRAOCULAR LENS PLACEMENT ([place]) LEFT;  Surgeon: Leandrew Koyanagi, MD;  Location: [place];  Service: Ophthalmology;   Laterality: Left;  . CHOLECYSTECTOMY    . [place]  . laser vein surgery    . LOOP RECORDER INSERTION N/A 09/24/2017   Procedure: LOOP RECORDER INSERTION;  Surgeon: Evans Lance, MD;  Location: [place] [facility];  Service: Cardiovascular;  Laterality: N/A;  . MANDIBLE RECONSTRUCTION  1992  . TOENAIL EXCISION  1998   several  . [place]  . VAGINAL HYSTERECTOMY  1983   als0- anterior/posterior colporrhaphy  . VAGINAL WOUND CLOSURE / REPAIR  1999, 2000  . VAGINAL WOUND CLOSURE / REPAIR      There were no vitals filed for this visit.  Subjective Assessment - 01/13/18 1102    Subjective   Pt. was late for the session.       OT TREATMENT    Therapeutic Exercise:  Pt. tolerated PROM/AAROM in all joint ranges of the LUE, and hand. Pt. Education was provided about self-ROM in all joint ranges.                         OT Education - 01/13/18 1149    Education provided  Yes    Education Details  LUE functioning    Person(s) Educated  Patient    Methods  Explanation;Demonstration;Verbal cues    Comprehension  Verbalized understanding;Returned demonstration;Need further instruction          OT Long Term Goals - 01/04/18 1149      OT LONG TERM GOAL #1   Title  Pt. will tolerate  shoulder PROM with pain improved to under 3/10    Baseline  Limited by pain    Time  12    Status  On-going    Target Date  03/29/18      OT LONG TERM GOAL #2   Title  Pt. will formulate a full fist to be able to stabilize objects with her Left hand while using her right hand.     Baseline  limited    Time  12    Period  Weeks    Status  On-going    Target Date  03/29/18      OT LONG TERM GOAL #3   Title  Pt. will be able to wipe face independently with her Left UE.    Baseline  Pt. is unable 10/11/2017: hand-over hand assist required    Time  12    Status  On-going    Target Date  03/29/18      OT LONG TERM GOAL #4   Title  Pt. will  demonstrate independence with splint application.    Baseline  Pt. is unable to donn splint 10/11/2017: assist required    Time  12    Period  Weeks    Status  On-going    Target Date  03/29/18      OT LONG TERM GOAL #5   Title  Pt. will initiate active grasp release with thumb to 2nd digit 100% of the time to be able to hold a utensil in preparation for self-feeding.    Baseline  Minimal thumb movement. 10/11/2018: Pt. conitnues to have limited thumb motion.    Time  12    Period  Weeks    Status  On-going    Target Date  03/29/18      OT LONG TERM GOAL #6   Title  Pt. will initiate wrist extension in preparation for functional reaching.    Baseline  No active wrist extension. 10/11/2018: inconsistently initiating wrist extension.    Time  12    Period  Weeks    Status  On-going    Target Date  03/29/18            Plan - 01/13/18 1150    Clinical Impression Statement Pt. Education was provided at length about the functional status of her LUE,and hand. Pt. has progress with the LUE has been limited. Progress has been limited by pain, multiple tears, tone, and arthritis. Pt. presents with increased tone in her LUE. Pt. has some active elbow flexion, supination, and minimal wrist extension. Follow-through with ROM at home is inconsistent. Pt. requires verbal cues and encouragement to work with her UE at home, and engage her LUE during tasks. Pt. continues to require verbal cues for redirection. Pt. plans to have Botox treatments during her visit to Dr. Letta Pate next week.     Occupational Profile and client history currently impacting functional performance  Pt. resides home alone, is retired, and has supprotive family.    Occupational performance deficits (Please refer to evaluation for details):  ADL's;IADL's    Rehab Potential  Good    OT Frequency  2x / week    OT Duration  12 weeks    OT Treatment/Interventions  Self-care/ADL training;Therapeutic exercise;Patient/family  education;DME and/or  AE instruction;Therapeutic activities    Clinical Decision Making  Several treatment options, min-mod task modification necessary    Consulted and Agree with Plan of Care  Patient       Patient will benefit from skilled therapeutic intervention in order to improve the following deficits and impairments:  Decreased cognition, Difficulty walking, Decreased balance, Decreased strength, Decreased safety awareness, Decreased coordination, Impaired UE functional use, Impaired tone, Decreased range of motion, Decreased endurance, Decreased activity tolerance, Impaired perceived functional ability, Pain  Visit Diagnosis: Muscle weakness (generalized)    Problem List Patient Active Problem List   Diagnosis Date Noted  . Left arm pain 11/05/2017  . Adhesive capsulitis of left shoulder 08/06/2017  . Urinary frequency 07/11/2017  . Gait disturbance, post-stroke 05/31/2017  . Left spastic hemiparesis ([place]) 05/31/2017  . Adjustment disorder with mixed anxiety and depressed mood   . Hypokalemia   . HCAP (healthcare-associated pneumonia)   . Cough with fever   . 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 FUO (fever of unknown origin)   . Oral thrush   . Benign essential HTN   . Rheumatoid arthritis involving both hands ([place]) 04/28/2017  . Right-sided lacunar infarction 04/27/2017  . CVA (cerebral vascular accident) ([place]) 04/24/2017  . LGSIL Pap smear of vagina 12/15/2016  . Anal skin tag 02/02/2016  . Inflammatory arthritis 01/19/2016  . Right knee pain 01/19/2016  . Genital herpes 10/20/2015  . GERD (gastroesophageal reflux disease) 10/20/2015  . Status post vaginal hysterectomy 10/20/2015  . Health care maintenance 05/30/2015  . LLQ pain 03/31/2014  . Menopausal symptoms 03/31/2014  . Hypercholesterolemia 12/31/2012  . Hypothyroidism 12/29/2012  . Osteoarthritis 12/29/2012  . PULMONARY NODULE 01/02/2008  . Mild depression ([place]) 12/30/2007  . Essential  hypertension 12/30/2007    Harrel Carina, MS, OTR/L 01/13/2018, 12:03 PM  [place] MAIN [facility] SERVICES [address] [place], [place], [address] Phone: [phone]   Fax:  [phone]  Name: GUY TONEY MRN: [id number] Date of Birth: Dec 18, 1946

## 2018-01-18 ENCOUNTER — Ambulatory Visit: Payer: PPO | Admitting: Occupational Therapy

## 2018-01-18 ENCOUNTER — Ambulatory Visit: Payer: PPO | Admitting: Physical Therapy

## 2018-01-20 ENCOUNTER — Encounter: Payer: Self-pay | Admitting: Occupational Therapy

## 2018-01-20 ENCOUNTER — Ambulatory Visit: Payer: PPO | Admitting: Physical Medicine & Rehabilitation

## 2018-01-20 ENCOUNTER — Ambulatory Visit: Payer: PPO | Admitting: Occupational Therapy

## 2018-01-20 ENCOUNTER — Encounter: Payer: Self-pay | Admitting: Physical Therapy

## 2018-01-20 ENCOUNTER — Ambulatory Visit: Payer: PPO | Admitting: Physical Therapy

## 2018-01-20 DIAGNOSIS — M6281 Muscle weakness (generalized): Secondary | ICD-10-CM | POA: Diagnosis not present

## 2018-01-20 DIAGNOSIS — R278 Other lack of coordination: Secondary | ICD-10-CM

## 2018-01-20 DIAGNOSIS — R2689 Other abnormalities of gait and mobility: Secondary | ICD-10-CM

## 2018-01-20 NOTE — Therapy (Signed)
[place] MAIN [facility] SERVICES [address] [place], [place], [address] Phone: [phone]   Fax:  [phone]  Physical Therapy Treatment  Patient Details  Name: Donna Rhodes MRN: [id number] Date of Birth: 10/09/1946 Referring Provider: Alysia Penna   Encounter Date: 01/20/2018  PT End of Session - 01/20/18 1108    Visit Number  29    Number of Visits  45    Date for PT Re-Evaluation  02/01/18    PT Start Time  1101    PT Stop Time  1145    PT Time Calculation (min)  44 min    Equipment Utilized During Treatment  Gait belt    Activity Tolerance  Patient tolerated treatment well;No increased pain    Behavior During Therapy  WFL for tasks assessed/performed       Past Medical History:  Diagnosis Date  . Atrophic vaginitis   . Dysplasia of vagina    vin 1 - vulva and vaginal cuff  . GERD (gastroesophageal reflux disease)   . HCV infection   . HSV (herpes simplex virus) infection   . Hypercholesterolemia   . Hypertension   . Hypothyroidism   . Left-sided weakness    S/P CVA 4/18.  PT 2x/wk.  . Lung nodule    stable  . Menopausal state   . Osteoarthritis    hand involvement, cervical disc disease  . Osteoporosis   . Pelvic pain in female   . Rheumatoid arthritis ([place])   . Stroke (cerebrum) ([place]) 04/24/2017  . Thyroid nodule   . Vaginal Pap smear, abnormal    lgsil    Past Surgical History:  Procedure Laterality Date  . ADENOIDECTOMY    . APPENDECTOMY  1965  . [place]   left  . CATARACT EXTRACTION W/PHACO Right 10/27/2017   Procedure: CATARACT EXTRACTION PHACO AND INTRAOCULAR LENS PLACEMENT ([place]) RIGHT;  Surgeon: Leandrew Koyanagi, MD;  Location: [place];  Service: Ophthalmology;  Laterality: Right;  requests early (8-8:30 arrival)  . CATARACT EXTRACTION W/PHACO Left 11/10/2017   Procedure: CATARACT EXTRACTION PHACO AND INTRAOCULAR LENS PLACEMENT ([place]) LEFT;  Surgeon: Leandrew Koyanagi, MD;  Location: [place];  Service: Ophthalmology;  Laterality: Left;  . CHOLECYSTECTOMY    . [place]  . laser vein surgery    . LOOP RECORDER INSERTION N/A 09/24/2017   Procedure: LOOP RECORDER INSERTION;  Surgeon: Evans Lance, MD;  Location: [place] [facility];  Service: Cardiovascular;  Laterality: N/A;  . MANDIBLE RECONSTRUCTION  1992  . TOENAIL EXCISION  1998   several  . [place]  . VAGINAL HYSTERECTOMY  1983   als0- anterior/posterior colporrhaphy  . VAGINAL WOUND CLOSURE / REPAIR  1999, 2000  . VAGINAL WOUND CLOSURE / REPAIR      There were no vitals filed for this visit.  Subjective Assessment - 01/20/18 1106    Subjective  Patient reports trying the band exercise some. She states, "I have been walking on my tip toes will that help?" She reports increased shoulder pain bilaterally. She states, "I think that I have overdone it on my right shoulder and I just don't know what to do."     Patient is accompained by:  Family member    Pertinent History  Pt is a 72 y.o female that presents to therapy s/p CVA; On April 24, 2017 pt suffered an acute infarct in right basal ganglia. She was in hospital 3  days; after pt was transferred to [facility] where she did inpatient therapy; Pt then completed home PT until about 2 weeks ago; Pt has pain in shoulder when moving it with R UE and reports that deltoid is paralyzed; Pt fell out of bed one day and fell on L UE; Pt reported doctor put a shot in L shoulder and will receive 3 more shots in shoulder; Pt reports no sensation or numbness and tingling with LE just some sensation impaired in L hand; Pt has an air cast for L LE and splint for wrist but does not use because difficulty donning;  Pt has had trouble sleeping at night with waking and going to the bathroom but denies any pain;  Only has pain in shoulder when moving described as throbbing; Pt reports not using her cane in the last month and states  her balance is fine without it;     Limitations  Lifting;Standing;Walking    How long can you sit comfortably?  hours     How long can you stand comfortably?  30 min     How long can you walk comfortably?  better in the morning because more energy;     Diagnostic tests  x rays in shoulder and hand; all clear; doctor stated "inferior pseudo-subluxation from paralysis of the deltoid. she has advanced left hand arthritis"    Patient Stated Goals  walking better without the cane;     Currently in Pain?  Yes    Pain Score  7     Pain Location  Shoulder    Pain Orientation  Left;Right    Pain Descriptors / Indicators  Aching;Sore    Pain Type  Chronic pain    Pain Radiating Towards  pain in left and right shoulder;     Pain Onset  More than a month ago    Pain Frequency  Intermittent    Aggravating Factors   worse with activity/reaching    Pain Relieving Factors  less pain with heat/stretch    Effect of Pain on Daily Activities  decreased activity tolerance;     Multiple Pain Sites  No             TREATMENT: Warm up on Nustep BLE level 2 x5 min concurrent with moist heat to low back and right shoulder (Unbilled);  Neuromuscular re-ed: Patient long sitting: PT applied walkaide to LLE for ankle DF/EV activation, in exercise mode, intensity #3, on 2 sec, off 5 sec x8 min; patient able to exhibit good ankle DF activation with walkaide activation; She denies any discomfort with exercise;   Therapeutic exercise:  Patient reports having trouble controlling LLE position when descending stairs; Instructed patient ineccentric step downs with each LE: x15 stepping down with RLE with cues to keep LLE knee in neutral position and avoid valgus/varus for better knee control; x15  stepping down with LLE with barrier to LLE foot to facilitate better hip abduction and mod VCs and tactile cues to facilitate ankle DF/EV for better heel strike;  Leg press: LLE only 45# 2x15 with cues to keep knee  in neutral and to increase ankle DF at end range for better ankle control/ROM;   LongSitting: LLE ankle DF red tband3x15; Patient required min VCs for correct positioning of band for better strengthening; LLE ankle EV yellow tband 3x15with tactile/visual cues to increase ROM for better ankle strengthening; Patient unable to achieve full ROM with red tband due to weakness;  Patient educated on importance of  motor control during exercise/ADLs to help control LLE foot placement.Also reinforced importance of HEP adherence; Patient denies any pain but reports mild fatigue at end of session  Patient able to exhibit better ankle DF at heel strike at end of session with less ankle IV as compared to previous sessions. She still feels that her walking is "not normal". Patient educated on how improving ankle DF will improve gait safety even if it doesn't feel as "normal" as she would prefer.                 PT Education - 01/20/18 1108    Education provided  Yes    Education Details  LLE strengthening, gait safety, stair negotiation;     Person(s) Educated  Patient    Methods  Explanation;Demonstration;Verbal cues    Comprehension  Verbalized understanding;Returned demonstration;Verbal cues required;Need further instruction          PT Long Term Goals - 01/04/18 1109      PT LONG TERM GOAL #1   Title  Pt will become independent with HEP in order to improve ADL independence;    Baseline  Pt performs several exercises frequently, but inconsistently performs other exercises.     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 on 8/28; improved to 1.25 m/s on 11/23/17    Time  12    Period  Weeks    Status  Achieved      PT LONG TERM GOAL #4   Title  Pt will improve 6MWT to >1300 feet in order to be considered 80% of age group norm and to improve activity tolerance and community ambulation distance for improved IADLs (revised from 1000")    Baseline  565 feet, improved to 875' on 8/28, improved to 1105' on 9/19; improved to 1245 on 11/23/17    Time  12    Period  Weeks    Status  Achieved      PT LONG TERM GOAL #5   Title  Pt will increase strength in L LE to 4+/5 for all muscles in order to improve mobility and function with daily activities;     Baseline  pt is 4+/5 in all LE except for L ankle DF: 4/5    Time  12    Period  Weeks    Status  Achieved      PT LONG TERM GOAL #6   Title  Pt will ambulate with equal stride length, no toe drag on LLE, and equal stance and swing time bilaterally to demonstrate improved gait mechanics for more efficient and functional mobility.    Time  12  Period  Weeks    Status  Achieved      PT LONG TERM GOAL #7   Title  Patient will ascend/descend 4 stairs without rail assist independently without loss of balance to improve ability to get in/out of home while carrying things.      Baseline  requires 1 rail assist    Time  4    Period  Weeks    Status  New    Target Date  02/01/18            Plan - 01/20/18 1132    Clinical Impression Statement  patient instructed in advanced LLE ankle/hip strengthening; PT applied walkaide for NMES to left anterior tib/peroneal nerve to facilitate better ankle DF in neutral ROM. Patient in exercise mode x8 min; Patient able to exhbit better ankle activation with walkaide. She did fatigue quickly with exercise.  She  was able to exhibit better ankle control with step today as compared to previous session; She would benefit from additional skilled PT intervention to improve strength, balance and gait safety;     Rehab Potential  Fair    Clinical Impairments Affecting Rehab Potential  neg: age, lives alone, comorbidities, pos: wants to be more independent; already high functioning     PT Frequency  2x / week    PT Duration  8 weeks    PT Treatment/Interventions  ADLs/Self Care Home Management;Aquatic Therapy;Moist Heat;Cryotherapy;DME Instruction;Gait training;Stair training;Functional mobility training;Therapeutic activities;Therapeutic exercise;Balance training;Neuromuscular re-education;Manual techniques;Passive range of motion;Energy conservation;Electrical Stimulation;Patient/family education;Orthotic Fit/Training;Dry needling    PT Next Visit Plan  balance on unstable surfaces, stairs,     PT Home Exercise Plan  continue as given;     Consulted and Agree with Plan of Care  Patient       Patient will benefit from skilled therapeutic intervention in order to improve the following deficits and impairments:  Abnormal gait, Impaired sensation, Decreased mobility, Decreased activity tolerance, Decreased endurance, Decreased range of motion, Decreased strength, Impaired UE functional use, Decreased balance, Decreased safety awareness, Difficulty walking, Pain  Visit Diagnosis: Muscle weakness (generalized)  Other lack of coordination  Other abnormalities of gait and mobility     Problem List Patient Active Problem List   Diagnosis Date Noted  . Left arm pain 11/05/2017  . Adhesive capsulitis of left shoulder 08/06/2017  . Urinary frequency 07/11/2017  . Gait disturbance, post-stroke 05/31/2017  . Left spastic hemiparesis ([place]) 05/31/2017  . Adjustment disorder with mixed anxiety and depressed mood   . Hypokalemia   . HCAP (healthcare-associated pneumonia)   . Cough with fever   . Flaccid  monoplegia of upper extremity ([place])   . FUO (fever of unknown origin)   . Oral thrush   . Benign essential HTN   . Rheumatoid arthritis involving both hands ([place]) 04/28/2017  . Right-sided lacunar infarction 04/27/2017  . CVA (cerebral vascular accident) ([place]) 04/24/2017  . LGSIL Pap smear of vagina 12/15/2016  . Anal skin tag 02/02/2016  . Inflammatory arthritis 01/19/2016  . Right knee pain 01/19/2016  . Genital herpes 10/20/2015  . GERD (gastroesophageal reflux disease) 10/20/2015  . Status post vaginal hysterectomy 10/20/2015  . Health care maintenance 05/30/2015  . LLQ pain 03/31/2014  . Menopausal symptoms 03/31/2014  . Hypercholesterolemia 12/31/2012  . Hypothyroidism 12/29/2012  . Osteoarthritis 12/29/2012  . PULMONARY NODULE 01/02/2008  . Mild depression ([place]) 12/30/2007  . Essential hypertension 12/30/2007    Trotter,Margaret PT, DPT 01/20/2018, 12:00 PM  [place]  [place], [place], [address] Phone: [phone]   Fax:  [phone]  Name: Donna Rhodes MRN: [id number] Date of Birth: 10/18/1946

## 2018-01-20 NOTE — Therapy (Signed)
[place] MAIN [facility] SERVICES [address] [place], [place], [address] Phone: [phone]   Fax:  [phone]  Occupational Therapy Treatment  Patient Details  Name: Donna Rhodes MRN: [id number] Date of Birth: Jan 31, 1946 Referring Provider: Alysia Penna   Encounter Date: 01/20/2018  OT End of Session - 01/20/18 1157    Visit Number  34    Number of Visits  30    Date for OT Re-Evaluation  03/29/18    OT Start Time  1020    OT Stop Time  1100    OT Time Calculation (min)  40 min    Activity Tolerance  Patient tolerated treatment well    Behavior During Therapy  [facility] for tasks assessed/performed       Past Medical History:  Diagnosis Date  . Atrophic vaginitis   . Dysplasia of vagina    vin 1 - vulva and vaginal cuff  . GERD (gastroesophageal reflux disease)   . HCV infection   . HSV (herpes simplex virus) infection   . Hypercholesterolemia   . Hypertension   . Hypothyroidism   . Left-sided weakness    S/P CVA 4/18.  PT 2x/wk.  . Lung nodule    stable  . Menopausal state   . Osteoarthritis    hand involvement, cervical disc disease  . Osteoporosis   . Pelvic pain in female   . Rheumatoid arthritis ([place])   . Stroke (cerebrum) ([place]) 04/24/2017  . Thyroid nodule   . Vaginal Pap smear, abnormal    lgsil    Past Surgical History:  Procedure Laterality Date  . ADENOIDECTOMY    . APPENDECTOMY  1965  . [place]   left  . CATARACT EXTRACTION W/PHACO Right 10/27/2017   Procedure: CATARACT EXTRACTION PHACO AND INTRAOCULAR LENS PLACEMENT ([place]) RIGHT;  Surgeon: Leandrew Koyanagi, MD;  Location: [place];  Service: Ophthalmology;  Laterality: Right;  requests early (8-8:30 arrival)  . CATARACT EXTRACTION W/PHACO Left 11/10/2017   Procedure: CATARACT EXTRACTION PHACO AND INTRAOCULAR LENS PLACEMENT ([place]) LEFT;  Surgeon: Leandrew Koyanagi, MD;  Location: [place];  Service: Ophthalmology;   Laterality: Left;  . CHOLECYSTECTOMY    . [place]  . laser vein surgery    . LOOP RECORDER INSERTION N/A 09/24/2017   Procedure: LOOP RECORDER INSERTION;  Surgeon: Evans Lance, MD;  Location: [place] [facility];  Service: Cardiovascular;  Laterality: N/A;  . MANDIBLE RECONSTRUCTION  1992  . TOENAIL EXCISION  1998   several  . [place]  . VAGINAL HYSTERECTOMY  1983   als0- anterior/posterior colporrhaphy  . VAGINAL WOUND CLOSURE / REPAIR  1999, 2000  . VAGINAL WOUND CLOSURE / REPAIR      There were no vitals filed for this visit.  Subjective Assessment - 01/20/18 1026    Subjective   Pt. with multiple complants today regarding he pants, hair, right shoulder, and left shoulder.     Pertinent History  Pt. is a 72 y.o. female who had a CVA on 04/24/2017. Pt. went to the ER when she started not feeling well, and having heart palpitations. Pt. reports having work up initially, however was sent home. Pt. reports later in the day she was unable to write with her left hand. Pt. returned to the ER, and was admitted with workup for a CVA. Pt. was admitted to [facility], and stayed for approximately 3 weeks. Pt. returned home alone, and  received home health services for the past 2 months. Pt. now presents for Outpatient OT services. Pt. resides home alone, and has support children who stop by daily, assist with shopping, transportation, and  ADL/IADL tasks as needed.     Currently in Pain?  Yes    Pain Score  7     Pain Location  Shoulder    Pain Orientation  Left    Pain Descriptors / Indicators  Aching    Pain Type  Acute pain       OT TREATMENT    Therapeutic Exercise:  Pt. Tolerated PROM in supine for shoulder flexion, abduction, horizontal abduction, and adduction, forearm supination pronation, wrist extension, and digit flexion, and extension. AAROM for elbow flexion, and extension.                       OT Education -  01/20/18 1157    Education provided  Yes    Education Details  LUE ROM    Person(s) Educated  Patient    Methods  Explanation;Demonstration;Verbal cues    Comprehension  Verbalized understanding;Returned demonstration;Verbal cues required;Need further instruction          OT Long Term Goals - 01/04/18 1149      OT LONG TERM GOAL #1   Title  Pt. will tolerate  shoulder PROM with pain improved to under 3/10    Baseline  Limited by pain    Time  12    Status  On-going    Target Date  03/29/18      OT LONG TERM GOAL #2   Title  Pt. will formulate a full fist to be able to stabilize objects with her Left hand while using her right hand.     Baseline  limited    Time  12    Period  Weeks    Status  On-going    Target Date  03/29/18      OT LONG TERM GOAL #3   Title  Pt. will be able to wipe face independently with her Left UE.    Baseline  Pt. is unable 10/11/2017: hand-over hand assist required    Time  12    Status  On-going    Target Date  03/29/18      OT LONG TERM GOAL #4   Title  Pt. will demonstrate independence with splint application.    Baseline  Pt. is unable to donn splint 10/11/2017: assist required    Time  12    Period  Weeks    Status  On-going    Target Date  03/29/18      OT LONG TERM GOAL #5   Title  Pt. will initiate active grasp release with thumb to 2nd digit 100% of the time to be able to hold a utensil in preparation for self-feeding.    Baseline  Minimal thumb movement. 10/11/2018: Pt. conitnues to have limited thumb motion.    Time  12    Period  Weeks    Status  On-going    Target Date  03/29/18      OT LONG TERM GOAL #6   Title  Pt. will initiate wrist extension in preparation for functional reaching.    Baseline  No active wrist extension. 10/11/2018: inconsistently initiating wrist extension.    Time  12    Period  Weeks    Status  On-going    Target Date  03/29/18  Plan - 01/20/18 1158    Clinical Impression  Statement Pt. requires cuing for redirection. Pt. reports being concerned about the ultrasound report. The impression of findings were read to the pt., however pt. was advised to review the results with her physician in order to answer her questions about the interpretation of results, and surgery. Pt. reported that she is thinking about getting, and second opinion from an orthopedic physician. Pt. initally perseverated on this, and required cues for redirection.    Occupational Profile and client history currently impacting functional performance  Pt. resides home alone, is retired, and has supprotive family.    Occupational performance deficits (Please refer to evaluation for details):  ADL's;IADL's    Rehab Potential  Good    OT Frequency  2x / week    OT Duration  12 weeks    OT Treatment/Interventions  Self-care/ADL training;Therapeutic exercise;Patient/family education;DME and/or AE instruction;Therapeutic activities    Clinical Decision Making  Several treatment options, min-mod task modification necessary    Consulted and Agree with Plan of Care  Patient       Patient will benefit from skilled therapeutic intervention in order to improve the following deficits and impairments:  Decreased cognition, Difficulty walking, Decreased balance, Decreased strength, Decreased safety awareness, Decreased coordination, Impaired UE functional use, Impaired tone, Decreased range of motion, Decreased endurance, Decreased activity tolerance, Impaired perceived functional ability, Pain  Visit Diagnosis: Muscle weakness (generalized)    Problem List Patient Active Problem List   Diagnosis Date Noted  . Left arm pain 11/05/2017  . Adhesive capsulitis of left shoulder 08/06/2017  . Urinary frequency 07/11/2017  . Gait disturbance, post-stroke 05/31/2017  . Left spastic hemiparesis ([place]) 05/31/2017  . Adjustment disorder with mixed anxiety and depressed mood   . Hypokalemia   . HCAP  (healthcare-associated pneumonia)   . Cough with fever   . 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 FUO (fever of unknown origin)   . Oral thrush   . Benign essential HTN   . Rheumatoid arthritis involving both hands ([place]) 04/28/2017  . Right-sided lacunar infarction 04/27/2017  . CVA (cerebral vascular accident) ([place]) 04/24/2017  . LGSIL Pap smear of vagina 12/15/2016  . Anal skin tag 02/02/2016  . Inflammatory arthritis 01/19/2016  . Right knee pain 01/19/2016  . Genital herpes 10/20/2015  . GERD (gastroesophageal reflux disease) 10/20/2015  . Status post vaginal hysterectomy 10/20/2015  . Health care maintenance 05/30/2015  . LLQ pain 03/31/2014  . Menopausal symptoms 03/31/2014  . Hypercholesterolemia 12/31/2012  . Hypothyroidism 12/29/2012  . Osteoarthritis 12/29/2012  . PULMONARY NODULE 01/02/2008  . Mild depression ([place]) 12/30/2007  . Essential hypertension 12/30/2007    Harrel Carina, MS, OTR/L 01/20/2018, 12:09 PM  [place] MAIN [facility] SERVICES [address] [place], [place], [address] Phone: [phone]   Fax:  [phone]  Name: Donna Rhodes MRN: [id number] Date of Birth: [birth year]

## 2018-01-24 ENCOUNTER — Ambulatory Visit (INDEPENDENT_AMBULATORY_CARE_PROVIDER_SITE_OTHER): Payer: PPO | Admitting: *Deleted

## 2018-01-24 DIAGNOSIS — I639 Cerebral infarction, unspecified: Secondary | ICD-10-CM

## 2018-01-24 NOTE — Progress Notes (Signed)
Carelink Summary Report / Loop Recorder 

## 2018-01-25 ENCOUNTER — Encounter: Payer: Self-pay | Admitting: Physical Therapy

## 2018-01-25 ENCOUNTER — Ambulatory Visit: Payer: PPO | Admitting: Occupational Therapy

## 2018-01-25 ENCOUNTER — Ambulatory Visit: Payer: PPO | Admitting: Physical Therapy

## 2018-01-25 DIAGNOSIS — M6281 Muscle weakness (generalized): Secondary | ICD-10-CM | POA: Diagnosis not present

## 2018-01-25 DIAGNOSIS — R278 Other lack of coordination: Secondary | ICD-10-CM

## 2018-01-25 DIAGNOSIS — R2689 Other abnormalities of gait and mobility: Secondary | ICD-10-CM

## 2018-01-25 NOTE — Therapy (Signed)
[place] MAIN [facility] SERVICES [address] [place], [place], [address] Phone: [phone]   Fax:  [phone]  Occupational Therapy Treatment  Patient Details  Name: Donna Rhodes MRN: [id number] Date of Birth: 01-02-1946 Referring Provider: Alysia Penna   Encounter Date: 01/25/2018  OT End of Session - 01/25/18 1024    Visit Number  35    Number of Visits  48    Date for OT Re-Evaluation  03/29/18    OT Start Time  7616    OT Stop Time  1100    OT Time Calculation (min)  45 min    Activity Tolerance  Patient tolerated treatment well    Behavior During Therapy  [place] for tasks assessed/performed       Past Medical History:  Diagnosis Date  . Atrophic vaginitis   . Dysplasia of vagina    vin 1 - vulva and vaginal cuff  . GERD (gastroesophageal reflux disease)   . HCV infection   . HSV (herpes simplex virus) infection   . Hypercholesterolemia   . Hypertension   . Hypothyroidism   . Left-sided weakness    S/P CVA 4/18.  PT 2x/wk.  . Lung nodule    stable  . Menopausal state   . Osteoarthritis    hand involvement, cervical disc disease  . Osteoporosis   . Pelvic pain in female   . Rheumatoid arthritis ([place])   . Stroke (cerebrum) ([place]) 04/24/2017  . Thyroid nodule   . Vaginal Pap smear, abnormal    lgsil    Past Surgical History:  Procedure Laterality Date  . ADENOIDECTOMY    . APPENDECTOMY  1965  . [place]   left  . CATARACT EXTRACTION W/PHACO Right 10/27/2017   Procedure: CATARACT EXTRACTION PHACO AND INTRAOCULAR LENS PLACEMENT ([place]) RIGHT;  Surgeon: Leandrew Koyanagi, MD;  Location: [place];  Service: Ophthalmology;  Laterality: Right;  requests early (8-8:30 arrival)  . CATARACT EXTRACTION W/PHACO Left 11/10/2017   Procedure: CATARACT EXTRACTION PHACO AND INTRAOCULAR LENS PLACEMENT ([place]) LEFT;  Surgeon: Leandrew Koyanagi, MD;  Location: [place];  Service: Ophthalmology;   Laterality: Left;  . CHOLECYSTECTOMY    . [place]  . laser vein surgery    . LOOP RECORDER INSERTION N/A 09/24/2017   Procedure: LOOP RECORDER INSERTION;  Surgeon: Evans Lance, MD;  Location: [place] CV LAB;  Service: Cardiovascular;  Laterality: N/A;  . MANDIBLE RECONSTRUCTION  1992  . TOENAIL EXCISION  1998   several  . [place]  . VAGINAL HYSTERECTOMY  1983   als0- anterior/posterior colporrhaphy  . VAGINAL WOUND CLOSURE / REPAIR  1999, 2000  . VAGINAL WOUND CLOSURE / REPAIR      There were no vitals filed for this visit.    OT TREATMENT    Therapeutic Exercise:  Pt. Tolerated gentle PROM in supine for shoulder flexion, abduction, horizontal abduction, and adduction, forearm supination pronation, wrist extension, digit flexion, and extension, and thumb radial, and palmar abduction. AAROM for elbow flexion, and extension, and wrist extension.                              OT Long Term Goals - 01/04/18 1149      OT LONG TERM GOAL #1   Title  Pt. will tolerate  shoulder PROM with pain improved to under 3/10    Baseline  Limited  by pain    Time  12    Status  On-going    Target Date  03/29/18      OT LONG TERM GOAL #2   Title  Pt. will formulate a full fist to be able to stabilize objects with her Left hand while using her right hand.     Baseline  limited    Time  12    Period  Weeks    Status  On-going    Target Date  03/29/18      OT LONG TERM GOAL #3   Title  Pt. will be able to wipe face independently with her Left UE.    Baseline  Pt. is unable 10/11/2017: hand-over hand assist required    Time  12    Status  On-going    Target Date  03/29/18      OT LONG TERM GOAL #4   Title  Pt. will demonstrate independence with splint application.    Baseline  Pt. is unable to donn splint 10/11/2017: assist required    Time  12    Period  Weeks    Status  On-going    Target Date  03/29/18      OT LONG TERM  GOAL #5   Title  Pt. will initiate active grasp release with thumb to 2nd digit 100% of the time to be able to hold a utensil in preparation for self-feeding.    Baseline  Minimal thumb movement. 10/11/2018: Pt. conitnues to have limited thumb motion.    Time  12    Period  Weeks    Status  On-going    Target Date  03/29/18      OT LONG TERM GOAL #6   Title  Pt. will initiate wrist extension in preparation for functional reaching.    Baseline  No active wrist extension. 10/11/2018: inconsistently initiating wrist extension.    Time  12    Period  Weeks    Status  On-going    Target Date  03/29/18            Plan - 01/25/18 1026    Clinical Impression Statement  Pt. plans to see Dr. Letta Pate this week for Botox. Pt. conintues to require verbal cues for redirection. Pt. has multiple questions about her left shoulder, the tears, and implications for surgery.  Pt. was advised to talk with Dr. Letta Pate to review her questions regarding the tears. Pt. reports she is planning to get a second opinion an orthopedic physician regarding her shoulder.     Occupational Profile and client history currently impacting functional performance  Pt. resides home alone, is retired, and has supprotive family.    Occupational performance deficits (Please refer to evaluation for details):  ADL's;IADL's    Rehab Potential  Good    OT Frequency  2x / week    OT Duration  12 weeks    OT Treatment/Interventions  Self-care/ADL training;Therapeutic exercise;Patient/family education;DME and/or AE instruction;Therapeutic activities    Clinical Decision Making  Several treatment options, min-mod task modification necessary    Consulted and Agree with Plan of Care  Patient       Patient will benefit from skilled therapeutic intervention in order to improve the following deficits and impairments:  Decreased cognition, Difficulty walking, Decreased balance, Decreased strength, Decreased safety awareness, Decreased  coordination, Impaired UE functional use, Impaired tone, Decreased range of motion, Decreased endurance, Decreased activity tolerance, Impaired perceived functional ability, Pain  Visit Diagnosis: Muscle  weakness (generalized)  Other lack of coordination    Problem List Patient Active Problem List   Diagnosis Date Noted  . Left arm pain 11/05/2017  . Adhesive capsulitis of left shoulder 08/06/2017  . Urinary frequency 07/11/2017  . Gait disturbance, post-stroke 05/31/2017  . Left spastic hemiparesis ([place]) 05/31/2017  . Adjustment disorder with mixed anxiety and depressed mood   . Hypokalemia   . HCAP (healthcare-associated pneumonia)   . Cough with fever   . 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 FUO (fever of unknown origin)   . Oral thrush   . Benign essential HTN   . Rheumatoid arthritis involving both hands ([place]) 04/28/2017  . Right-sided lacunar infarction 04/27/2017  . CVA (cerebral vascular accident) ([place]) 04/24/2017  . LGSIL Pap smear of vagina 12/15/2016  . Anal skin tag 02/02/2016  . Inflammatory arthritis 01/19/2016  . Right knee pain 01/19/2016  . Genital herpes 10/20/2015  . GERD (gastroesophageal reflux disease) 10/20/2015  . Status post vaginal hysterectomy 10/20/2015  . Health care maintenance 05/30/2015  . LLQ pain 03/31/2014  . Menopausal symptoms 03/31/2014  . Hypercholesterolemia 12/31/2012  . Hypothyroidism 12/29/2012  . Osteoarthritis 12/29/2012  . PULMONARY NODULE 01/02/2008  . Mild depression ([place]) 12/30/2007  . Essential hypertension 12/30/2007    Harrel Carina, MS, OTR/L 01/25/2018, 11:33 AM  [place] MAIN [facility] SERVICES [address] [place], [place], [address] Phone: [phone]   Fax:  [phone]  Name: RUTHY FORRY MRN: [id number] Date of Birth: 1946-08-25

## 2018-01-25 NOTE — Therapy (Signed)
[place] MAIN [facility] SERVICES [address] [place], [place], [address] Phone: [phone]   Fax:  [phone]  Physical Therapy Treatment/Discharge Summary  Patient Details  Name: Donna Rhodes MRN: [id number] Date of Birth: [birth year] Referring Provider: Alysia Rhodes   Encounter Date: 01/25/2018  PT End of Session - 01/25/18 1123    Visit Number  30    Number of Visits  45    Date for PT Re-Evaluation  02/01/18    PT Start Time  1114    PT Stop Time  1200    PT Time Calculation (min)  46 min    Equipment Utilized During Treatment  Gait belt    Activity Tolerance  Patient tolerated treatment well;No increased pain    Behavior During Therapy  WFL for tasks assessed/performed       Past Medical History:  Diagnosis Date  . Atrophic vaginitis   . Dysplasia of vagina    vin 1 - vulva and vaginal cuff  . GERD (gastroesophageal reflux disease)   . HCV infection   . HSV (herpes simplex virus) infection   . Hypercholesterolemia   . Hypertension   . Hypothyroidism   . Left-sided weakness    S/P CVA 4/18.  PT 2x/wk.  . Lung nodule    stable  . Menopausal state   . Osteoarthritis    hand involvement, cervical disc disease  . Osteoporosis   . Pelvic pain in female   . Rheumatoid arthritis ([place])   . Stroke (cerebrum) ([place]) 04/24/2017  . Thyroid nodule   . Vaginal Pap smear, abnormal    lgsil    Past Surgical History:  Procedure Laterality Date  . ADENOIDECTOMY    . APPENDECTOMY  1965  . [place]   left  . CATARACT EXTRACTION W/PHACO Right 10/27/2017   Procedure: CATARACT EXTRACTION PHACO AND INTRAOCULAR LENS PLACEMENT ([place]) RIGHT;  Surgeon: Leandrew Koyanagi, MD;  Location: [place];  Service: Ophthalmology;  Laterality: Right;  requests early (8-8:30 arrival)  . CATARACT EXTRACTION W/PHACO Left 11/10/2017   Procedure: CATARACT EXTRACTION PHACO AND INTRAOCULAR LENS PLACEMENT ([place]) LEFT;  Surgeon:  Leandrew Koyanagi, MD;  Location: [place];  Service: Ophthalmology;  Laterality: Left;  . CHOLECYSTECTOMY    . [place]  . laser vein surgery    . LOOP RECORDER INSERTION N/A 09/24/2017   Procedure: LOOP RECORDER INSERTION;  Surgeon: Evans Lance, MD;  Location: [place] CV LAB;  Service: Cardiovascular;  Laterality: N/A;  . MANDIBLE RECONSTRUCTION  1992  . TOENAIL EXCISION  1998   several  . [place]  . VAGINAL HYSTERECTOMY  1983   als0- anterior/posterior colporrhaphy  . VAGINAL WOUND CLOSURE / REPAIR  1999, 2000  . VAGINAL WOUND CLOSURE / REPAIR      There were no vitals filed for this visit.  Subjective Assessment - 01/25/18 1122    Subjective  Patient reports doing her exercises some at home. She reports increased right shoulder pain due to overuse.     Patient is accompained by:  Family member    Pertinent History  Pt is a 72 y.o female that presents to therapy s/p CVA; On April 24, 2017 pt suffered an acute infarct in right basal ganglia. 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; Pt fell out of bed one day and fell on L UE; Pt reported doctor put a shot in L shoulder and will receive 3 more shots in shoulder; Pt reports no sensation or numbness and tingling with LE just some sensation impaired in L hand; Pt has an air cast for L LE and splint for wrist but does not use because difficulty donning;  Pt has had trouble sleeping at night with waking and going to the bathroom but denies any pain;  Only has pain in shoulder when moving described as throbbing; Pt reports not using her cane in the last month and states her balance is fine without it;     Limitations  Lifting;Standing;Walking    How long can you sit comfortably?  hours     How long can you  stand comfortably?  30 min     How long can you walk comfortably?  better in the morning because more energy;     Diagnostic tests  x rays in shoulder and hand; all clear; doctor stated "inferior pseudo-subluxation from paralysis of the deltoid. she has advanced left hand arthritis"    Patient Stated Goals  walking better without the cane;     Currently in Pain?  Yes    Pain Score  7     Pain Location  Shoulder    Pain Orientation  Right    Pain Descriptors / Indicators  Aching;Sore    Pain Type  Chronic pain    Pain Onset  More than a month ago    Pain Frequency  Intermittent    Aggravating Factors   worse with repetitive activity, reaching    Pain Relieving Factors  less pain with heat/stretch    Effect of Pain on Daily Activities  decreased activity tolerance;     Multiple Pain Sites  No        TREATMENT:   Patient long sitting: PT applied walkaide to LLE for ankle DF/EV activation, in exercise mode, intensity #6, with pulse duration reduced to 50 for better tolerance, on 2 sec, off 2 sec x5 min; patient able to exhibit good ankle DF activation with walkaide activation; she was able to exhibit better ankle DF/EV activation with walkaide with better ROM/flexibililty;    Therapeutic exercise:  Patient reports having trouble controlling LLE position when descending stairs; Instructed patient ineccentric step downs with each LE: x15 stepping down with RLE with cues to keep LLE knee in neutral position and avoid valgus/varus for better knee control; x15  stepping down with LLE with barrier to LLE foot to facilitate better hip abduction and mod VCs and tactile cues to facilitate ankle DF/EV for better heel strike;  Leg press: LLE only 60# 2x10with cues to keep knee in neutral and to increase ankle DF at end range for better ankle control/ROM;    Patient able to negotiate 4 steps forward ascend/descend, reciprocal when ascend, non-reciprocal when descending x2 sets supervision  without rail assist; She is able to exhibit good control during ascending but requires supervision due to unsteadiness when descending;  LongSitting: LLE ankle DF red tband3x15; Patient required min VCs for correct positioning of band for better strengthening; LLE ankle EV yellow tband3x15with tactile/visual cues to increase ROM for better ankle strengthening; Patient unable to achieve full ROM with red tband due to weakness;  Patient educated on importance of motor control during exercise/ADLs to help control LLE foot placement.Also reinforced importance  of HEP adherence; Patient denies any pain but reports mild fatigue at end of session Patient able to exhibit better ankle DF at heel strike at end of session with less ankle IV as compared to previous sessions. She still feels that her walking is "not normal". Patient educated on how improving ankle DF will improve gait safety even if it doesn't feel as "normal" as she would prefer.                    PT Education - 01/25/18 1123    Education provided  Yes    Education Details  LLE strengthening, ankle strengthening, motor control tasks;     Person(s) Educated  Patient    Methods  Explanation;Demonstration;Verbal cues    Comprehension  Verbalized understanding;Returned demonstration;Verbal cues required;Need further instruction          PT Long Term Goals - 01/25/18 1129      PT LONG TERM GOAL #1   Title  Pt will become independent with HEP in order to improve ADL independence;    Baseline  Pt performs several exercises frequently, but inconsistently performs other exercises.     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 on 8/28; improved to 1.25 m/s on 11/23/17    Time  12    Period  Weeks    Status  Achieved      PT LONG TERM GOAL #4   Title  Pt will improve 6MWT to >1300 feet in order to be considered 80% of age group norm and to improve activity tolerance and community ambulation distance for improved IADLs (revised from 1000")    Baseline  565 feet, improved to 875' on 8/28, improved to 1105' on 9/19; improved to 1245 on 11/23/17    Time  12    Period  Weeks    Status  Achieved      PT LONG TERM GOAL #5   Title  Pt will increase strength in L LE to 4+/5 for all muscles in order to improve mobility and function with daily activities;     Baseline  pt is 4+/5 in all LE except for L ankle DF: 4/5    Time  12    Period  Weeks    Status  Achieved      PT LONG TERM GOAL #6   Title  Pt will ambulate with equal stride length, no toe drag on LLE, and equal stance and swing time bilaterally to demonstrate improved gait mechanics for more efficient and functional mobility.    Time  12    Period  Weeks    Status  Achieved      PT LONG TERM GOAL #7   Title  Patient will ascend/descend 4 stairs without rail assist independently without loss of balance to improve ability to get in/out of home while carrying things.      Baseline  able to do without rail but requires supervision;     Time  4    Period  Weeks    Status  Partially Met    Target Date  02/01/18            Plan - 01/25/18 1245    Clinical Impression Statement  Patient is able to exhibit better mobility with stair negotiation being able to negotiate stairs without rail assist with supervision. She does have slight difficulty during descending stairs due to decreased  LLE motor control and weakness. PT instructed patient in advanced ankle strengthening exercise with NMES to left anterior tib for better ankle DF and tband exercise. patient is able to exhibit better foot clearance with gait when she is thinking about it but will exhibit increased ankle IV and toe slap when focused on other tasks. patient has an extensive HEP including tband ankle exercise. patient is appropriate for discharge from PT at this time as she is independent in HEP and is ambulating independently;     Rehab Potential  Fair    Clinical Impairments Affecting Rehab Potential  neg: age, lives alone, comorbidities, pos: wants to be more independent; already high functioning     PT Frequency  2x / week    PT Duration  8 weeks    PT Treatment/Interventions  ADLs/Self Care Home Management;Aquatic Therapy;Moist Heat;Cryotherapy;DME Instruction;Gait training;Stair training;Functional mobility training;Therapeutic activities;Therapeutic exercise;Balance training;Neuromuscular re-education;Manual techniques;Passive range of motion;Energy conservation;Electrical Stimulation;Patient/family education;Orthotic Fit/Training;Dry needling    PT Next Visit Plan  balance on unstable surfaces, stairs,     PT Home Exercise Plan  continue as given;     Consulted and Agree with Plan of Care  Patient       Patient will benefit from skilled therapeutic intervention in order to improve the following deficits and impairments:  Abnormal gait, Impaired sensation, Decreased mobility, Decreased activity tolerance, Decreased endurance, Decreased range of motion, Decreased strength, Impaired UE functional use, Decreased balance, Decreased safety awareness, Difficulty walking, Pain  Visit Diagnosis: Muscle weakness (generalized)  Other lack of coordination  Other abnormalities of gait and mobility     Problem List Patient Active Problem List   Diagnosis Date Noted  . Left arm pain 11/05/2017  . Adhesive  capsulitis of left shoulder 08/06/2017  . Urinary frequency 07/11/2017  . Gait disturbance, post-stroke 05/31/2017  . Left spastic hemiparesis ([place]) 05/31/2017  . Adjustment disorder with mixed anxiety and depressed mood   . Hypokalemia   . HCAP (healthcare-associated pneumonia)   . Cough with fever   . 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 FUO (fever of unknown origin)   . Oral thrush   . Benign essential HTN   . Rheumatoid arthritis involving both hands ([place]) 04/28/2017  . Right-sided lacunar infarction 04/27/2017  . CVA (cerebral vascular accident) ([place]) 04/24/2017  . LGSIL Pap smear of vagina 12/15/2016  . Anal skin tag 02/02/2016  . Inflammatory arthritis 01/19/2016  . Right knee pain 01/19/2016  . Genital herpes 10/20/2015  . GERD (gastroesophageal reflux disease) 10/20/2015  . Status post vaginal hysterectomy 10/20/2015  . Health care maintenance 05/30/2015  . LLQ pain 03/31/2014  . Menopausal symptoms 03/31/2014  . Hypercholesterolemia 12/31/2012  . Hypothyroidism  12/29/2012  . Osteoarthritis 12/29/2012  . PULMONARY NODULE 01/02/2008  . Mild depression ([place]) 12/30/2007  . Essential hypertension 12/30/2007    Darthy Manganelli PT, DPT 01/25/2018, 12:48 PM  [place] MAIN [facility] SERVICES [address] [place], [place], [address] Phone: [phone]   Fax:  [phone]  Name: JENAVEVE FENSTERMAKER MRN: [id number] Date of Birth: 1946-07-28

## 2018-01-27 ENCOUNTER — Ambulatory Visit: Payer: PPO | Admitting: Physical Therapy

## 2018-01-27 ENCOUNTER — Encounter: Payer: PPO | Admitting: Occupational Therapy

## 2018-01-27 ENCOUNTER — Ambulatory Visit: Payer: PPO | Admitting: Physical Medicine & Rehabilitation

## 2018-01-28 ENCOUNTER — Ambulatory Visit: Payer: PPO | Attending: Physical Medicine & Rehabilitation | Admitting: Occupational Therapy

## 2018-01-28 DIAGNOSIS — R278 Other lack of coordination: Secondary | ICD-10-CM | POA: Insufficient documentation

## 2018-01-28 DIAGNOSIS — M6281 Muscle weakness (generalized): Secondary | ICD-10-CM | POA: Insufficient documentation

## 2018-01-31 ENCOUNTER — Ambulatory Visit: Payer: PPO | Admitting: Occupational Therapy

## 2018-02-02 ENCOUNTER — Ambulatory Visit: Payer: PPO | Admitting: Occupational Therapy

## 2018-02-04 ENCOUNTER — Encounter: Payer: Self-pay | Admitting: Physical Medicine & Rehabilitation

## 2018-02-04 ENCOUNTER — Encounter: Payer: PPO | Attending: Physical Medicine & Rehabilitation

## 2018-02-04 ENCOUNTER — Ambulatory Visit (HOSPITAL_BASED_OUTPATIENT_CLINIC_OR_DEPARTMENT_OTHER): Payer: PPO | Admitting: Physical Medicine & Rehabilitation

## 2018-02-04 VITALS — BP 141/80 | HR 67

## 2018-02-04 DIAGNOSIS — K219 Gastro-esophageal reflux disease without esophagitis: Secondary | ICD-10-CM | POA: Diagnosis not present

## 2018-02-04 DIAGNOSIS — G8114 Spastic hemiplegia affecting left nondominant side: Secondary | ICD-10-CM

## 2018-02-04 DIAGNOSIS — Z7902 Long term (current) use of antithrombotics/antiplatelets: Secondary | ICD-10-CM | POA: Insufficient documentation

## 2018-02-04 DIAGNOSIS — M7989 Other specified soft tissue disorders: Secondary | ICD-10-CM | POA: Diagnosis not present

## 2018-02-04 DIAGNOSIS — E785 Hyperlipidemia, unspecified: Secondary | ICD-10-CM | POA: Diagnosis not present

## 2018-02-04 DIAGNOSIS — M069 Rheumatoid arthritis, unspecified: Secondary | ICD-10-CM | POA: Diagnosis not present

## 2018-02-04 DIAGNOSIS — I10 Essential (primary) hypertension: Secondary | ICD-10-CM | POA: Diagnosis not present

## 2018-02-04 DIAGNOSIS — I69354 Hemiplegia and hemiparesis following cerebral infarction affecting left non-dominant side: Secondary | ICD-10-CM | POA: Diagnosis not present

## 2018-02-04 DIAGNOSIS — R531 Weakness: Secondary | ICD-10-CM | POA: Insufficient documentation

## 2018-02-04 DIAGNOSIS — Z7982 Long term (current) use of aspirin: Secondary | ICD-10-CM | POA: Diagnosis not present

## 2018-02-04 DIAGNOSIS — R269 Unspecified abnormalities of gait and mobility: Secondary | ICD-10-CM | POA: Diagnosis not present

## 2018-02-04 NOTE — Progress Notes (Signed)
Botox Injection for spasticity using needle EMG guidance  Dilution: 50 Units/ml Indication: Severe spasticity which interferes with ADL,mobility and/or  hygiene and is unresponsive to medication management and other conservative care Informed consent was obtained after describing risks and benefits of the procedure with the patient. This includes bleeding, bruising, infection, excessive weakness, or medication side effects. A REMS form is on file and signed. Needle: 27g 1" needle electrode Number of units per muscle Biceps50 FCR25 FCU0 FDS25 FDP25 FPL25 Brachioradialis 50 All injections were done after obtaining appropriate EMG activity and after negative drawback for blood. The patient tolerated the procedure well. Post procedure instructions were given. A followup appointment was made.   Still complaining of Left post stroke shoulder pain , not relieved by OT and Gabapentin TID rec increase to QID, Rec for PENS Sprint SPR Left axillary nerve stim

## 2018-02-04 NOTE — Patient Instructions (Signed)

## 2018-02-07 ENCOUNTER — Ambulatory Visit: Payer: PPO | Admitting: Occupational Therapy

## 2018-02-07 ENCOUNTER — Encounter: Payer: Self-pay | Admitting: Occupational Therapy

## 2018-02-07 DIAGNOSIS — R278 Other lack of coordination: Secondary | ICD-10-CM

## 2018-02-07 DIAGNOSIS — M6281 Muscle weakness (generalized): Secondary | ICD-10-CM | POA: Diagnosis not present

## 2018-02-07 LAB — CUP PACEART REMOTE DEVICE CHECK
Date Time Interrogation Session: 20190126173603
MDC IDC PG IMPLANT DT: 20180928

## 2018-02-07 NOTE — Therapy (Signed)
[place] MAIN [facility] SERVICES [address] [place], [place], [address] Phone: [phone]   Fax:  [phone]  Occupational Therapy Treatment  Patient Details  Name: Donna Rhodes MRN: [id number] Date of Birth: September 01, 1946 Referring Provider: Alysia Penna   Encounter Date: 02/07/2018  OT End of Session - 02/07/18 1030    Visit Number  36    Number of Visits  64    Date for OT Re-Evaluation  03/29/18    OT Start Time  1020    OT Stop Time  1100    OT Time Calculation (min)  40 min    Activity Tolerance  Patient tolerated treatment well    Behavior During Therapy  [facility] for tasks assessed/performed       Past Medical History:  Diagnosis Date  . Atrophic vaginitis   . Dysplasia of vagina    vin 1 - vulva and vaginal cuff  . GERD (gastroesophageal reflux disease)   . HCV infection   . HSV (herpes simplex virus) infection   . Hypercholesterolemia   . Hypertension   . Hypothyroidism   . Left-sided weakness    S/P CVA 4/18.  PT 2x/wk.  . Lung nodule    stable  . Menopausal state   . Osteoarthritis    hand involvement, cervical disc disease  . Osteoporosis   . Pelvic pain in female   . Rheumatoid arthritis ([place])   . Stroke (cerebrum) ([place]) 04/24/2017  . Thyroid nodule   . Vaginal Pap smear, abnormal    lgsil    Past Surgical History:  Procedure Laterality Date  . ADENOIDECTOMY    . APPENDECTOMY  1965  . [place]   left  . CATARACT EXTRACTION W/PHACO Right 10/27/2017   Procedure: CATARACT EXTRACTION PHACO AND INTRAOCULAR LENS PLACEMENT ([place]) RIGHT;  Surgeon: Leandrew Koyanagi, MD;  Location: [place];  Service: Ophthalmology;  Laterality: Right;  requests early (8-8:30 arrival)  . CATARACT EXTRACTION W/PHACO Left 11/10/2017   Procedure: CATARACT EXTRACTION PHACO AND INTRAOCULAR LENS PLACEMENT ([place]) LEFT;  Surgeon: Leandrew Koyanagi, MD;  Location: [place];  Service: Ophthalmology;   Laterality: Left;  . CHOLECYSTECTOMY    . [place]  . laser vein surgery    . LOOP RECORDER INSERTION N/A 09/24/2017   Procedure: LOOP RECORDER INSERTION;  Surgeon: Evans Lance, MD;  Location: [place] CV LAB;  Service: Cardiovascular;  Laterality: N/A;  . MANDIBLE RECONSTRUCTION  1992  . TOENAIL EXCISION  1998   several  . [place]  . VAGINAL HYSTERECTOMY  1983   als0- anterior/posterior colporrhaphy  . VAGINAL WOUND CLOSURE / REPAIR  1999, 2000  . VAGINAL WOUND CLOSURE / REPAIR      There were no vitals filed for this visit.  Subjective Assessment - 02/07/18 1028    Subjective   Pt. reports she is helping her mother move.    Patient is accompained by:  Family member    Pertinent History  Pt. is a 72 y.o. female who had a CVA on 04/24/2017. Pt. went to the ER when she started not feeling well, and having heart palpitations. Pt. reports having work up initially, however was sent home. Pt. reports later in the day she was unable to write with her left hand. Pt. returned to the ER, and was admitted with workup for a CVA. Pt. was admitted to [facility], and stayed for approximately 3 weeks. Pt. returned  home alone, and  received home health services for the past 2 months. Pt. now presents for Outpatient OT services. Pt. resides home alone, and has support children who stop by daily, assist with shopping, transportation, and  ADL/IADL tasks as needed.     Currently in Pain?  No/denies       OT TREATMENT    Therapeutic Exercise:  Pt. Tolerated gentle PROM in all joint ranges in supine, and sitting. Pt. LUE shoulder ROM is limited. Pt. Attempted AROM for supination, wrist extension, digit extension, and thumb abduction. Pt. Education was provided about the [employer] brace.                        OT Education - 02/07/18 1029    Education provided  Yes    Education Details  LUE functioning.    Person(s) Educated  Patient     Methods  Explanation;Demonstration;Verbal cues    Comprehension  Verbalized understanding;Returned demonstration;Verbal cues required;Need further instruction          OT Long Term Goals - 01/04/18 1149      OT LONG TERM GOAL #1   Title  Pt. will tolerate  shoulder PROM with pain improved to under 3/10    Baseline  Limited by pain    Time  12    Status  On-going    Target Date  03/29/18      OT LONG TERM GOAL #2   Title  Pt. will formulate a full fist to be able to stabilize objects with her Left hand while using her right hand.     Baseline  limited    Time  12    Period  Weeks    Status  On-going    Target Date  03/29/18      OT LONG TERM GOAL #3   Title  Pt. will be able to wipe face independently with her Left UE.    Baseline  Pt. is unable 10/11/2017: hand-over hand assist required    Time  12    Status  On-going    Target Date  03/29/18      OT LONG TERM GOAL #4   Title  Pt. will demonstrate independence with splint application.    Baseline  Pt. is unable to donn splint 10/11/2017: assist required    Time  12    Period  Weeks    Status  On-going    Target Date  03/29/18      OT LONG TERM GOAL #5   Title  Pt. will initiate active grasp release with thumb to 2nd digit 100% of the time to be able to hold a utensil in preparation for self-feeding.    Baseline  Minimal thumb movement. 10/11/2018: Pt. conitnues to have limited thumb motion.    Time  12    Period  Weeks    Status  On-going    Target Date  03/29/18      OT LONG TERM GOAL #6   Title  Pt. will initiate wrist extension in preparation for functional reaching.    Baseline  No active wrist extension. 10/11/2018: inconsistently initiating wrist extension.    Time  12    Period  Weeks    Status  On-going    Target Date  03/29/18            Plan - 02/07/18 1226    Clinical Impression Statement  Pt. received Botox injections on Friday at  her follow-up with Dr. Letta Pate. Pt. repports they are still  looking into the the percutaneous stimulator. Pt. reports they are also looking into the [place] hand system that her daughter found on the internet. Pt. continues to work on improving UE functioning.     Occupational Profile and client history currently impacting functional performance  Pt. resides home alone, is retired, and has supprotive family.    Occupational performance deficits (Please refer to evaluation for details):  ADL's;IADL's    Rehab Potential  Good    OT Frequency  2x / week    OT Duration  12 weeks    OT Treatment/Interventions  Self-care/ADL training;Therapeutic exercise;Patient/family education;DME and/or AE instruction;Therapeutic activities    Clinical Decision Making  Several treatment options, min-mod task modification necessary    Consulted and Agree with Plan of Care  Patient       Patient will benefit from skilled therapeutic intervention in order to improve the following deficits and impairments:  Decreased cognition, Difficulty walking, Decreased balance, Decreased strength, Decreased safety awareness, Decreased coordination, Impaired UE functional use, Impaired tone, Decreased range of motion, Decreased endurance, Decreased activity tolerance, Impaired perceived functional ability, Pain  Visit Diagnosis: Muscle weakness (generalized)  Other lack of coordination    Problem List Patient Active Problem List   Diagnosis Date Noted  . Left arm pain 11/05/2017  . Adhesive capsulitis of left shoulder 08/06/2017  . Urinary frequency 07/11/2017  . Gait disturbance, post-stroke 05/31/2017  . Left spastic hemiparesis ([place]) 05/31/2017  . Adjustment disorder with mixed anxiety and depressed mood   . Hypokalemia   . HCAP (healthcare-associated pneumonia)   . Cough with fever   . 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 FUO (fever of unknown origin)   . Oral thrush   . Benign essential HTN   . Rheumatoid arthritis involving both hands ([place]) 04/28/2017  .  Right-sided lacunar infarction 04/27/2017  . CVA (cerebral vascular accident) ([place]) 04/24/2017  . LGSIL Pap smear of vagina 12/15/2016  . Anal skin tag 02/02/2016  . Inflammatory arthritis 01/19/2016  . Right knee pain 01/19/2016  . Genital herpes 10/20/2015  . GERD (gastroesophageal reflux disease) 10/20/2015  . Status post vaginal hysterectomy 10/20/2015  . Health care maintenance 05/30/2015  . LLQ pain 03/31/2014  . Menopausal symptoms 03/31/2014  . Hypercholesterolemia 12/31/2012  . Hypothyroidism 12/29/2012  . Osteoarthritis 12/29/2012  . PULMONARY NODULE 01/02/2008  . Mild depression ([place]) 12/30/2007  . Essential hypertension 12/30/2007    Harrel Carina, MS, OTR/L 02/07/2018, 12:31 PM  [place] MAIN [facility] SERVICES [address] [place], [place], [address] Phone: [phone]   Fax:  [phone]  Name: KARESSA ONORATO MRN: [id number] Date of Birth: [birth year]

## 2018-02-08 ENCOUNTER — Telehealth: Payer: Self-pay | Admitting: Physical Medicine & Rehabilitation

## 2018-02-08 NOTE — Telephone Encounter (Signed)
Dr. Alyson Ingles with [facility] will contact you at 12:15 to discuss the procedure appeal

## 2018-02-10 ENCOUNTER — Encounter: Payer: Self-pay | Admitting: Occupational Therapy

## 2018-02-10 ENCOUNTER — Ambulatory Visit: Payer: PPO | Admitting: Occupational Therapy

## 2018-02-10 DIAGNOSIS — M6281 Muscle weakness (generalized): Secondary | ICD-10-CM | POA: Diagnosis not present

## 2018-02-10 NOTE — Therapy (Signed)
[place] MAIN [facility] SERVICES [address] [place], [place], [address] Phone: [phone]   Fax:  [phone]  Occupational Therapy Treatment  Patient Details  Name: Donna Rhodes MRN: [id number] Date of Birth: May 21, 1946 Referring Provider: Alysia Penna   Encounter Date: 02/10/2018  OT End of Session - 02/10/18 1203    Visit Number  37    Number of Visits  35    Date for OT Re-Evaluation  03/29/18    OT Start Time  1155    OT Stop Time  1230    OT Time Calculation (min)  35 min    Activity Tolerance  Patient tolerated treatment well    Behavior During Therapy  [facility] for tasks assessed/performed       Past Medical History:  Diagnosis Date  . Atrophic vaginitis   . Dysplasia of vagina    vin 1 - vulva and vaginal cuff  . GERD (gastroesophageal reflux disease)   . HCV infection   . HSV (herpes simplex virus) infection   . Hypercholesterolemia   . Hypertension   . Hypothyroidism   . Left-sided weakness    S/P CVA 4/18.  PT 2x/wk.  . Lung nodule    stable  . Menopausal state   . Osteoarthritis    hand involvement, cervical disc disease  . Osteoporosis   . Pelvic pain in female   . Rheumatoid arthritis ([place])   . Stroke (cerebrum) ([place]) 04/24/2017  . Thyroid nodule   . Vaginal Pap smear, abnormal    lgsil    Past Surgical History:  Procedure Laterality Date  . ADENOIDECTOMY    . APPENDECTOMY  1965  . [place]   left  . CATARACT EXTRACTION W/PHACO Right 10/27/2017   Procedure: CATARACT EXTRACTION PHACO AND INTRAOCULAR LENS PLACEMENT ([place]) RIGHT;  Surgeon: Leandrew Koyanagi, MD;  Location: [place];  Service: Ophthalmology;  Laterality: Right;  requests early (8-8:30 arrival)  . CATARACT EXTRACTION W/PHACO Left 11/10/2017   Procedure: CATARACT EXTRACTION PHACO AND INTRAOCULAR LENS PLACEMENT ([place]) LEFT;  Surgeon: Leandrew Koyanagi, MD;  Location: [place];  Service: Ophthalmology;   Laterality: Left;  . CHOLECYSTECTOMY    . [place]  . laser vein surgery    . LOOP RECORDER INSERTION N/A 09/24/2017   Procedure: LOOP RECORDER INSERTION;  Surgeon: Evans Lance, MD;  Location: [place] CV LAB;  Service: Cardiovascular;  Laterality: N/A;  . MANDIBLE RECONSTRUCTION  1992  . TOENAIL EXCISION  1998   several  . [place]  . VAGINAL HYSTERECTOMY  1983   als0- anterior/posterior colporrhaphy  . VAGINAL WOUND CLOSURE / REPAIR  1999, 2000  . VAGINAL WOUND CLOSURE / REPAIR      There were no vitals filed for this visit.  Subjective Assessment - 02/10/18 1202    Subjective   Pt. reports her mother is staying with her now.    Patient is accompained by:  Family member    Pertinent History  Pt. is a 72 y.o. female who had a CVA on 04/24/2017. Pt. went to the ER when she started not feeling well, and having heart palpitations. Pt. reports having work up initially, however was sent home. Pt. reports later in the day she was unable to write with her left hand. Pt. returned to the ER, and was admitted with workup for a CVA. Pt. was admitted to [facility], and stayed for approximately 3 weeks. Pt.  returned home alone, and  received home health services for the past 2 months. Pt. now presents for Outpatient OT services. Pt. resides home alone, and has support children who stop by daily, assist with shopping, transportation, and  ADL/IADL tasks as needed.     Currently in Pain?  No/denies       OT TREATMENT    Therapeutic Exercise:  Pt. tolerated PROM with slow gentle stretching to the Left shoulder for flexion, abduction, horizontal abduction, adduction, and wrist extension. AROM/AAROM for elbow flexion, hand to face patterns in preparation for self-grooming. Forearm supination, pronation, digit extension, and thumb abduction.                            OT Education - 02/10/18 1203    Education provided  Yes     Education Details  UE functioning.    Person(s) Educated  Patient    Methods  Explanation;Demonstration;Verbal cues    Comprehension  Verbalized understanding;Returned demonstration;Verbal cues required;Need further instruction          OT Long Term Goals - 01/04/18 1149      OT LONG TERM GOAL #1   Title  Pt. will tolerate  shoulder PROM with pain improved to under 3/10    Baseline  Limited by pain    Time  12    Status  On-going    Target Date  03/29/18      OT LONG TERM GOAL #2   Title  Pt. will formulate a full fist to be able to stabilize objects with her Left hand while using her right hand.     Baseline  limited    Time  12    Period  Weeks    Status  On-going    Target Date  03/29/18      OT LONG TERM GOAL #3   Title  Pt. will be able to wipe face independently with her Left UE.    Baseline  Pt. is unable 10/11/2017: hand-over hand assist required    Time  12    Status  On-going    Target Date  03/29/18      OT LONG TERM GOAL #4   Title  Pt. will demonstrate independence with splint application.    Baseline  Pt. is unable to donn splint 10/11/2017: assist required    Time  12    Period  Weeks    Status  On-going    Target Date  03/29/18      OT LONG TERM GOAL #5   Title  Pt. will initiate active grasp release with thumb to 2nd digit 100% of the time to be able to hold a utensil in preparation for self-feeding.    Baseline  Minimal thumb movement. 10/11/2018: Pt. conitnues to have limited thumb motion.    Time  12    Period  Weeks    Status  On-going    Target Date  03/29/18      OT LONG TERM GOAL #6   Title  Pt. will initiate wrist extension in preparation for functional reaching.    Baseline  No active wrist extension. 10/11/2018: inconsistently initiating wrist extension.    Time  12    Period  Weeks    Status  On-going    Target Date  03/29/18            Plan - 02/10/18 1257    Clinical Impression Statement  Pt.. has bruising on her Left  elbow from holding a storm door open while helping her mother move. Pt. reports she has a gear extender for her car. Pt. reports a friend is going to install it.  Pt. reports the percutaneous stimulator was denied again by the insurance company. Pt. has a foolw-up appointment with Dr. Letta Pate next week, and an orthopedic consult at the beginning of March. Pt. presents with improvements with tone in the forearm wrist, and digits. Pt. has improved active elbow flexion for hand to face patterns in preparation for grooming. Pt. is initiating forearm supination, thumb abduction, and trace digit extension consistently.    Occupational performance deficits (Please refer to evaluation for details):  ADL's;IADL's    Rehab Potential  Good    OT Frequency  2x / week    OT Duration  12 weeks    OT Treatment/Interventions  Self-care/ADL training;Therapeutic exercise;Patient/family education;DME and/or AE instruction;Therapeutic activities    Clinical Decision Making  Several treatment options, min-mod task modification necessary    Consulted and Agree with Plan of Care  Patient       Patient will benefit from skilled therapeutic intervention in order to improve the following deficits and impairments:  Decreased cognition, Difficulty walking, Decreased balance, Decreased strength, Decreased safety awareness, Decreased coordination, Impaired UE functional use, Impaired tone, Decreased range of motion, Decreased endurance, Decreased activity tolerance, Impaired perceived functional ability, Pain  Visit Diagnosis: Muscle weakness (generalized)    Problem List Patient Active Problem List   Diagnosis Date Noted  . Left arm pain 11/05/2017  . Adhesive capsulitis of left shoulder 08/06/2017  . Urinary frequency 07/11/2017  . Gait disturbance, post-stroke 05/31/2017  . Left spastic hemiparesis ([place]) 05/31/2017  . Adjustment disorder with mixed anxiety and depressed mood   . Hypokalemia   . HCAP  (healthcare-associated pneumonia)   . Cough with fever   . 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 FUO (fever of unknown origin)   . Oral thrush   . Benign essential HTN   . Rheumatoid arthritis involving both hands ([place]) 04/28/2017  . Right-sided lacunar infarction 04/27/2017  . CVA (cerebral vascular accident) ([place]) 04/24/2017  . LGSIL Pap smear of vagina 12/15/2016  . Anal skin tag 02/02/2016  . Inflammatory arthritis 01/19/2016  . Right knee pain 01/19/2016  . Genital herpes 10/20/2015  . GERD (gastroesophageal reflux disease) 10/20/2015  . Status post vaginal hysterectomy 10/20/2015  . Health care maintenance 05/30/2015  . LLQ pain 03/31/2014  . Menopausal symptoms 03/31/2014  . Hypercholesterolemia 12/31/2012  . Hypothyroidism 12/29/2012  . Osteoarthritis 12/29/2012  . PULMONARY NODULE 01/02/2008  . Mild depression ([place]) 12/30/2007  . Essential hypertension 12/30/2007    Harrel Carina, MS, OTR/L 02/10/2018, 1:08 PM  [place] MAIN [facility] SERVICES [address] [place], [place], [address] Phone: [phone]   Fax:  [phone]  Name: MARIAISABEL BODIFORD MRN: [id number] Date of Birth: [birth year]

## 2018-02-11 ENCOUNTER — Ambulatory Visit (INDEPENDENT_AMBULATORY_CARE_PROVIDER_SITE_OTHER): Payer: PPO | Admitting: Internal Medicine

## 2018-02-11 ENCOUNTER — Encounter: Payer: Self-pay | Admitting: Internal Medicine

## 2018-02-11 VITALS — BP 134/78 | HR 69 | Temp 98.0°F | Resp 18 | Wt 112.8 lb

## 2018-02-11 DIAGNOSIS — F320 Major depressive disorder, single episode, mild: Secondary | ICD-10-CM

## 2018-02-11 DIAGNOSIS — E7800 Pure hypercholesterolemia, unspecified: Secondary | ICD-10-CM | POA: Diagnosis not present

## 2018-02-11 DIAGNOSIS — F4323 Adjustment disorder with mixed anxiety and depressed mood: Secondary | ICD-10-CM | POA: Diagnosis not present

## 2018-02-11 DIAGNOSIS — F32A Depression, unspecified: Secondary | ICD-10-CM

## 2018-02-11 DIAGNOSIS — M069 Rheumatoid arthritis, unspecified: Secondary | ICD-10-CM | POA: Diagnosis not present

## 2018-02-11 DIAGNOSIS — G8320 Monoplegia of upper limb affecting unspecified side: Secondary | ICD-10-CM | POA: Diagnosis not present

## 2018-02-11 DIAGNOSIS — Z23 Encounter for immunization: Secondary | ICD-10-CM | POA: Diagnosis not present

## 2018-02-11 DIAGNOSIS — K219 Gastro-esophageal reflux disease without esophagitis: Secondary | ICD-10-CM | POA: Diagnosis not present

## 2018-02-11 DIAGNOSIS — I639 Cerebral infarction, unspecified: Secondary | ICD-10-CM | POA: Diagnosis not present

## 2018-02-11 DIAGNOSIS — I10 Essential (primary) hypertension: Secondary | ICD-10-CM

## 2018-02-11 DIAGNOSIS — E2839 Other primary ovarian failure: Secondary | ICD-10-CM | POA: Diagnosis not present

## 2018-02-11 MED ORDER — PANTOPRAZOLE SODIUM 20 MG PO TBEC
20.0000 mg | DELAYED_RELEASE_TABLET | Freq: Every day | ORAL | 3 refills | Status: DC
Start: 2018-02-11 — End: 2018-05-18

## 2018-02-11 NOTE — Progress Notes (Signed)
Patient ID: Donna Rhodes, female   DOB: 10-04-1946, 72 y.o.   MRN: [id number]   Subjective:    Patient ID: Donna Rhodes, female    DOB: Jul 29, 1946, 72 y.o.   MRN: [id number]  HPI  Patient here for a scheduled follow up.  Recent CVA.  Resulting left hemiparesis in the left upper extremity.  Going to therapy.  Has not had much improvement in hand strength. Increased shoulder pain.  Had ultrasound that revealed a tear. Has had prior steroid injection and botox injections.  Not thought to be a surgical candidate.  She has an appt with Dr Roland Rack for evaluation.  States wants a second opinion.  No chest pain.  No sob.  No acid reflux.  Would like to decrease her protonix dose.  No abdominal pain.  Bowels moving.     Past Medical History:  Diagnosis Date  . Atrophic vaginitis   . Dysplasia of vagina    vin 1 - vulva and vaginal cuff  . GERD (gastroesophageal reflux disease)   . HCV infection   . HSV (herpes simplex virus) infection   . Hypercholesterolemia   . Hypertension   . Hypothyroidism   . Left-sided weakness    S/P CVA 4/18.  PT 2x/wk.  . Lung nodule    stable  . Menopausal state   . Osteoarthritis    hand involvement, cervical disc disease  . Osteoporosis   . Pelvic pain in female   . Rheumatoid arthritis ([place])   . Stroke (cerebrum) ([place]) 04/24/2017  . Thyroid nodule   . Vaginal Pap smear, abnormal    lgsil   Past Surgical History:  Procedure Laterality Date  . ADENOIDECTOMY    . APPENDECTOMY  1965  . [place]   left  . CATARACT EXTRACTION W/PHACO Right 10/27/2017   Procedure: CATARACT EXTRACTION PHACO AND INTRAOCULAR LENS PLACEMENT ([place]) RIGHT;  Surgeon: Leandrew Koyanagi, MD;  Location: [facility];  Service: Ophthalmology;  Laterality: Right;  requests early (8-8:30 arrival)  . CATARACT EXTRACTION W/PHACO Left 11/10/2017   Procedure: CATARACT EXTRACTION PHACO AND INTRAOCULAR LENS PLACEMENT ([place]) LEFT;  Surgeon: Leandrew Koyanagi, MD;   Location: [facility];  Service: Ophthalmology;  Laterality: Left;  . CHOLECYSTECTOMY    . [place]  . laser vein surgery    . LOOP RECORDER INSERTION N/A 09/24/2017   Procedure: LOOP RECORDER INSERTION;  Surgeon: Evans Lance, MD;  Location: [place] [facility];  Service: Cardiovascular;  Laterality: N/A;  . MANDIBLE RECONSTRUCTION  1992  . TOENAIL EXCISION  1998   several  . [place]  . VAGINAL HYSTERECTOMY  1983   als0- anterior/posterior colporrhaphy  . VAGINAL WOUND CLOSURE / REPAIR  1999, 2000  . VAGINAL WOUND CLOSURE / REPAIR     Family History  Problem Relation Age of Onset  . Hypertension Father   . Dementia Father   . Osteoarthritis Mother   . Arthritis/Rheumatoid Sister   . Breast cancer Unknown        paternal side  . Breast cancer Paternal Aunt   . Breast cancer Cousin        maternal  . Uterine cancer Maternal Grandmother   . Ovarian cancer Neg Hx   . Heart disease Neg Hx   . Diabetes Neg Hx   . Colon cancer Neg Hx    Social History   Socioeconomic History  . Marital status: Divorced    Spouse name: None  .  Number of children: 3  . Years of education: 22  . Highest education level: None  Social Needs  . Financial resource strain: None  . Food insecurity - worry: None  . Food insecurity - inability: None  . Transportation needs - medical: None  . Transportation needs - non-medical: None  Occupational History    Comment: retired  Tobacco Use  . Smoking status: Former Smoker    Last attempt to quit: 12/29/1979    Years since quitting: 38.1  . Smokeless tobacco: Never Used  Substance and Sexual Activity  . Alcohol use: No    Alcohol/week: 0.0 oz  . Drug use: No  . Sexual activity: Not Currently    Birth control/protection: Surgical  Other Topics Concern  . None  Social History Narrative   Lives alone   Caffeine- 1 cup coffee    Outpatient Encounter Medications as of 02/11/2018  Medication Sig  . acetaminophen  (TYLENOL) 500 MG tablet Take 500-1,000 mg by mouth every 6 (six) hours as needed for mild pain.  Marland Kitchen acyclovir (ZOVIRAX) 200 MG capsule TAKE ONE CAPSULE BY MOUTH TWICE DAILY  . amLODipine (NORVASC) 2.5 MG tablet Take 2.5 mg by mouth daily.   Marland Kitchen atorvastatin (LIPITOR) 20 MG tablet Take 1 tablet (20 mg total) by mouth daily.  . calcium-vitamin D (OSCAL WITH D) 250-125 MG-UNIT tablet Take 1 tablet by mouth daily.  . citalopram (CELEXA) 10 MG tablet TAKE 1 TABLET BY MOUTH ONCE DAILY  . clopidogrel (PLAVIX) 75 MG tablet Take 1 tablet (75 mg total) by mouth daily.  . folic acid (FOLVITE) 1 MG tablet Take 1 mg by mouth daily.  Marland Kitchen gabapentin (NEURONTIN) 100 MG capsule Take 1 capsule (100 mg total) by mouth 3 (three) times daily.  Marland Kitchen levothyroxine (SYNTHROID, LEVOTHROID) 50 MCG tablet Take 1 tablet (50 mcg total) by mouth daily before breakfast.  . methotrexate 50 MG/2ML injection Inject 0.3mL under the skin once a weekly on Wednesday  . Multiple Vitamin (MULTIVITAMIN WITH MINERALS) TABS tablet Take 1 tablet by mouth daily.  . [DISCONTINUED] citalopram (CELEXA) 10 MG tablet Take 10 mg by mouth daily.  . [DISCONTINUED] pantoprazole (PROTONIX) 40 MG tablet Take 40 mg by mouth daily.  . pantoprazole (PROTONIX) 20 MG tablet Take 1 tablet (20 mg total) by mouth daily.   No facility-administered encounter medications on file as of 02/11/2018.     Review of Systems  Constitutional: Negative for appetite change and unexpected weight change.  HENT: Negative for congestion and sinus pressure.   Respiratory: Negative for cough, chest tightness and shortness of breath.   Cardiovascular: Negative for chest pain, palpitations and leg swelling.  Gastrointestinal: Negative for abdominal pain, diarrhea, nausea and vomiting.  Genitourinary: Negative for difficulty urinating and dysuria.  Musculoskeletal:       Persistent left shoulder pain.  Weakness in her left hand.    Skin: Negative for color change and rash.    Neurological: Negative for dizziness, light-headedness and headaches.  Psychiatric/Behavioral: Negative for agitation and dysphoric mood.       Objective:    Physical Exam  Constitutional: She appears well-developed and well-nourished. No distress.  HENT:  Nose: Nose normal.  Mouth/Throat: Oropharynx is clear and moist.  Neck: Neck supple. No thyromegaly present.  Cardiovascular: Normal rate and regular rhythm.  Pulmonary/Chest: Breath sounds normal. No respiratory distress. She has no wheezes.  Abdominal: Soft. Bowel sounds are normal. There is no tenderness.  Musculoskeletal: She exhibits no edema or tenderness.  Decreased  strength in left hand.    Lymphadenopathy:    She has no cervical adenopathy.  Skin: No rash noted. No erythema.  Psychiatric: She has a normal mood and affect. Her behavior is normal.    BP 134/78 (BP Location: Right Arm, Cuff Size: Normal)   Pulse 69   Temp 98 F (36.7 C) (Oral)   Resp 18   Wt 112 lb 12.8 oz (51.2 kg)   LMP 12/29/1981   SpO2 98%   BMI 19.36 kg/m  Wt Readings from Last 3 Encounters:  02/11/18 112 lb 12.8 oz (51.2 kg)  11/10/17 110 lb (49.9 kg)  11/02/17 110 lb 3.2 oz (50 kg)     Lab Results  Component Value Date   WBC 6.9 08/16/2017   HGB 12.4 08/16/2017   HCT 37.9 08/16/2017   PLT 252.0 08/16/2017   GLUCOSE 83 12/13/2017   CHOL 149 12/13/2017   TRIG 87.0 12/13/2017   HDL 62.40 12/13/2017   LDLDIRECT 128.0 05/25/2013   LDLCALC 70 12/13/2017   ALT 23 12/13/2017   AST 25 12/13/2017   NA 142 12/13/2017   K 4.5 12/13/2017   CL 105 12/13/2017   CREATININE 0.75 12/13/2017   BUN 16 12/13/2017   CO2 30 12/13/2017   TSH 3.09 12/13/2017   HGBA1C 5.7 (H) 04/25/2017       Assessment & Plan:   Problem List Items Addressed This Visit    Adjustment disorder with mixed anxiety and depressed mood    On citalopram.  Stable.        Benign essential HTN    Blood pressure under good control.  Continue same medication  regimen.  Follow pressures.  Follow metabolic panel.        Relevant Orders   Basic metabolic panel   CVA (cerebral vascular accident) ([place])    Previous CVA.  Resulting left upper extremity hemiparesis.  Left hand weakness.  Continue aspirin and plavix.  Follow.        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  Received botox.        GERD (gastroesophageal reflux disease)    Controlled.  Will decrease protonix to 20mg  q day.  Follow.        Relevant Medications   pantoprazole (PROTONIX) 20 MG tablet   Hypercholesterolemia    On lipitor.  She wanted to lower dose.  Explained to her to stay on current dose.  Follow lipid panel and liver function tests.        Relevant Orders   Hepatic function panel   Lipid panel   Mild depression ([place])    On citalopram.  Stable.       Rheumatoid arthritis involving both hands ([place])    Followed by Dr Jefm Bryant.  On MTX.         Other Visit Diagnoses    Estrogen deficiency    -  Primary   Relevant Orders   DG Bone Density   Need for vaccination with 13-polyvalent pneumococcal conjugate vaccine       Relevant Orders   Pneumococcal conjugate vaccine 13-valent (Completed)       Einar Pheasant, MD

## 2018-02-11 NOTE — Patient Instructions (Signed)
Stop protonix 40mg  per day.    Start protonix 20mg  per day.

## 2018-02-13 ENCOUNTER — Encounter: Payer: Self-pay | Admitting: Internal Medicine

## 2018-02-13 NOTE — Assessment & Plan Note (Signed)
Followed by Dr Kernodle.  On MTX.   

## 2018-02-13 NOTE — Assessment & Plan Note (Signed)
On lipitor.  She wanted to lower dose.  Explained to her to stay on current dose.  Follow lipid panel and liver function tests.

## 2018-02-13 NOTE — Assessment & Plan Note (Signed)
Continue physical therapy.  Received botox.

## 2018-02-13 NOTE — Assessment & Plan Note (Signed)
On citalopram.  Stable.   

## 2018-02-13 NOTE — Assessment & Plan Note (Signed)
Controlled.  Will decrease protonix to 20mg  q day.  Follow.

## 2018-02-13 NOTE — Assessment & Plan Note (Signed)
Blood pressure under good control.  Continue same medication regimen.  Follow pressures.  Follow metabolic panel.   

## 2018-02-13 NOTE — Assessment & Plan Note (Signed)
Previous CVA.  Resulting left upper extremity hemiparesis.  Left hand weakness.  Continue aspirin and plavix.  Follow.

## 2018-02-15 ENCOUNTER — Ambulatory Visit: Payer: PPO | Admitting: Occupational Therapy

## 2018-02-17 ENCOUNTER — Ambulatory Visit: Payer: PPO | Admitting: Occupational Therapy

## 2018-02-17 ENCOUNTER — Encounter: Payer: Self-pay | Admitting: Occupational Therapy

## 2018-02-17 DIAGNOSIS — E2839 Other primary ovarian failure: Secondary | ICD-10-CM | POA: Diagnosis not present

## 2018-02-17 DIAGNOSIS — M6281 Muscle weakness (generalized): Secondary | ICD-10-CM

## 2018-02-17 NOTE — Therapy (Signed)
[place] MAIN [facility] SERVICES [address] [place], [place], [address] Phone: [phone]   Fax:  [phone]  Occupational Therapy Treatment  Patient Details  Name: Donna Rhodes MRN: [id number] Date of Birth: [birth year] Referring Provider: Alysia Penna   Encounter Date: 02/17/2018  OT End of Session - 02/17/18 1200    Visit Number  11    Number of Visits  48    Date for OT Re-Evaluation  03/29/18    OT Start Time  1155    OT Stop Time  1230    OT Time Calculation (min)  35 min    Activity Tolerance  Patient tolerated treatment well    Behavior During Therapy  [facility] for tasks assessed/performed       Past Medical History:  Diagnosis Date  . Atrophic vaginitis   . Dysplasia of vagina    vin 1 - vulva and vaginal cuff  . GERD (gastroesophageal reflux disease)   . HCV infection   . HSV (herpes simplex virus) infection   . Hypercholesterolemia   . Hypertension   . Hypothyroidism   . Left-sided weakness    S/P CVA 4/18.  PT 2x/wk.  . Lung nodule    stable  . Menopausal state   . Osteoarthritis    hand involvement, cervical disc disease  . Osteoporosis   . Pelvic pain in female   . Rheumatoid arthritis ([place])   . Stroke (cerebrum) ([place]) 04/24/2017  . Thyroid nodule   . Vaginal Pap smear, abnormal    lgsil    Past Surgical History:  Procedure Laterality Date  . ADENOIDECTOMY    . APPENDECTOMY  1965  . [place]   left  . CATARACT EXTRACTION W/PHACO Right 10/27/2017   Procedure: CATARACT EXTRACTION PHACO AND INTRAOCULAR LENS PLACEMENT ([place]) RIGHT;  Surgeon: Leandrew Koyanagi, MD;  Location: [facility];  Service: Ophthalmology;  Laterality: Right;  requests early (8-8:30 arrival)  . CATARACT EXTRACTION W/PHACO Left 11/10/2017   Procedure: CATARACT EXTRACTION PHACO AND INTRAOCULAR LENS PLACEMENT ([place]) LEFT;  Surgeon: Leandrew Koyanagi, MD;  Location: [place] [facility];  Service: Ophthalmology;   Laterality: Left;  . CHOLECYSTECTOMY    . [place]  . laser vein surgery    . LOOP RECORDER INSERTION N/A 09/24/2017   Procedure: LOOP RECORDER INSERTION;  Surgeon: Evans Lance, MD;  Location: [facility];  Service: Cardiovascular;  Laterality: N/A;  . MANDIBLE RECONSTRUCTION  1992  . TOENAIL EXCISION  1998   several  . [place]  . VAGINAL HYSTERECTOMY  1983   als0- anterior/posterior colporrhaphy  . VAGINAL WOUND CLOSURE / REPAIR  1999, 2000  . VAGINAL WOUND CLOSURE / REPAIR      There were no vitals filed for this visit.  Subjective Assessment - 02/17/18 1556    Subjective   Pt. was late for the session.    Patient is accompained by:  Family member    Pertinent History  Pt. is a 72 y.o. female who had a CVA on 04/24/2017. Pt. went to the ER when she started not feeling well, and having heart palpitations. Pt. reports having work up initially, however was sent home. Pt. reports later in the day she was unable to write with her left hand. Pt. returned to the ER, and was admitted with workup for a CVA. Pt. was admitted to [facility], and stayed for approximately 3 weeks. Pt. returned home alone,  and  received home health services for the past 2 months. Pt. now presents for Outpatient OT services. Pt. resides home alone, and has support children who stop by daily, assist with shopping, transportation, and  ADL/IADL tasks as needed.     Currently in Pain?  No/denies        OT TREATMENT    Therapeutic Exercise:  Pt. Tolerated AROM/AAROM for elbow flexion, hand to face patterns in preparation for self-grooming. Forearm supination, pronation, digit extension, and thumb abduction.                            OT Education - 02/17/18 1557    Education provided  Yes    Education Details  UE functioning    Person(s) Educated  Patient    Methods  Demonstration;Verbal cues;Explanation    Comprehension  Returned  demonstration;Verbal cues required;Verbalized understanding;Need further instruction          OT Long Term Goals - 01/04/18 1149      OT LONG TERM GOAL #1   Title  Pt. will tolerate  shoulder PROM with pain improved to under 3/10    Baseline  Limited by pain    Time  12    Status  On-going    Target Date  03/29/18      OT LONG TERM GOAL #2   Title  Pt. will formulate a full fist to be able to stabilize objects with her Left hand while using her right hand.     Baseline  limited    Time  12    Period  Weeks    Status  On-going    Target Date  03/29/18      OT LONG TERM GOAL #3   Title  Pt. will be able to wipe face independently with her Left UE.    Baseline  Pt. is unable 10/11/2017: hand-over hand assist required    Time  12    Status  On-going    Target Date  03/29/18      OT LONG TERM GOAL #4   Title  Pt. will demonstrate independence with splint application.    Baseline  Pt. is unable to donn splint 10/11/2017: assist required    Time  12    Period  Weeks    Status  On-going    Target Date  03/29/18      OT LONG TERM GOAL #5   Title  Pt. will initiate active grasp release with thumb to 2nd digit 100% of the time to be able to hold a utensil in preparation for self-feeding.    Baseline  Minimal thumb movement. 10/11/2018: Pt. conitnues to have limited thumb motion.    Time  12    Period  Weeks    Status  On-going    Target Date  03/29/18      OT LONG TERM GOAL #6   Title  Pt. will initiate wrist extension in preparation for functional reaching.    Baseline  No active wrist extension. 10/11/2018: inconsistently initiating wrist extension.    Time  12    Period  Weeks    Status  On-going    Target Date  03/29/18            Plan - 02/17/18 1559    Clinical Impression Statement Pt. reports she has an appointment with Dr. Letta Pate tomorrow, and was questioning whether she should go or not. Pt. expressed  interest in pursuing the Bioness arm/hand device.  Pt. also reported interest in the [facility] hand glove. Pt. education was provided about the difference between the 2 systems. Pt. is eliciting active wrist extension, elbow flexion, for hand to face patterns, and supination. Pt. continues to work on eliciting LUE functional movements for improved engagement in ADLs, and IADLs since recently having had Botox treatments. Pt. overall progress is limited.     Occupational performance deficits (Please refer to evaluation for details):  ADL's;IADL's    Rehab Potential  Good    OT Frequency  2x / week    OT Duration  12 weeks    OT Treatment/Interventions  Self-care/ADL training;Therapeutic exercise;Patient/family education;DME and/or AE instruction;Therapeutic activities    Clinical Decision Making  Several treatment options, min-mod task modification necessary    Consulted and Agree with Plan of Care  Patient       Patient will benefit from skilled therapeutic intervention in order to improve the following deficits and impairments:  Decreased cognition, Difficulty walking, Decreased balance, Decreased strength, Decreased safety awareness, Decreased coordination, Impaired UE functional use, Impaired tone, Decreased range of motion, Decreased endurance, Decreased activity tolerance, Impaired perceived functional ability, Pain  Visit Diagnosis: Muscle weakness (generalized)    Problem List Patient Active Problem List   Diagnosis Date Noted  . Left arm pain 11/05/2017  . Adhesive capsulitis of left shoulder 08/06/2017  . Gait disturbance, post-stroke 05/31/2017  . Left spastic hemiparesis ([place]) 05/31/2017  . Adjustment disorder with mixed anxiety and depressed mood   . Hypokalemia   . 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 Benign essential HTN   . Rheumatoid arthritis involving both hands ([place]) 04/28/2017  . Right-sided lacunar infarction 04/27/2017  . CVA (cerebral vascular accident) ([place]) 04/24/2017  . LGSIL Pap smear of vagina  12/15/2016  . Anal skin tag 02/02/2016  . Inflammatory arthritis 01/19/2016  . Right knee pain 01/19/2016  . Genital herpes 10/20/2015  . GERD (gastroesophageal reflux disease) 10/20/2015  . Status post vaginal hysterectomy 10/20/2015  . Health care maintenance 05/30/2015  . Menopausal symptoms 03/31/2014  . Hypercholesterolemia 12/31/2012  . Hypothyroidism 12/29/2012  . Osteoarthritis 12/29/2012  . PULMONARY NODULE 01/02/2008  . Mild depression ([place]) 12/30/2007  . Essential hypertension 12/30/2007    Harrel Carina, MS ,OTR/L 02/17/2018, 4:23 PM  [place] MAIN [facility] SERVICES [address] [place], [place], [address] Phone: [phone]   Fax:  [phone]  Name: Donna Rhodes MRN: [id number] Date of Birth: Mar 23, 1946

## 2018-02-18 ENCOUNTER — Telehealth: Payer: Self-pay | Admitting: Diagnostic Neuroimaging

## 2018-02-18 ENCOUNTER — Ambulatory Visit: Payer: PPO | Admitting: Physical Medicine & Rehabilitation

## 2018-02-18 NOTE — Telephone Encounter (Signed)
Pt daughter(on DPR) is asking for a call to know if Dr Leta Baptist wants to see pt before Aug or if the appointment scheduled for 08-27 is a good f/u time.  Please call

## 2018-02-18 NOTE — Telephone Encounter (Signed)
LVM informing daughter that the patient had a FU in Dec 2018, but she cancelled it. Advised that this RN wanted to discuss how patient is doing, and advised Dr Leta Baptist is booking into Aug now. However if she needs to be seen, this RN will work her in sooner. Advised the office closes today at noon, opens Mon at 8 am; left office number for call back.

## 2018-02-22 ENCOUNTER — Encounter: Payer: Self-pay | Admitting: Occupational Therapy

## 2018-02-22 ENCOUNTER — Ambulatory Visit: Payer: PPO | Admitting: Occupational Therapy

## 2018-02-22 DIAGNOSIS — M6281 Muscle weakness (generalized): Secondary | ICD-10-CM

## 2018-02-22 NOTE — Therapy (Signed)
[place] MAIN [facility] SERVICES [address] [place], [place], [address] Phone: [phone]   Fax:  [phone]  Occupational Therapy Treatment  Patient Details  Name: Donna Rhodes MRN: [id number] Date of Birth: [birth year] Referring Provider: Alysia Penna   Encounter Date: 02/22/2018  OT End of Session - 02/22/18 1515    Visit Number  12    Number of Visits  107    Date for OT Re-Evaluation  03/29/18    OT Start Time  1155    OT Stop Time  1230    OT Time Calculation (min)  35 min    Activity Tolerance  Patient tolerated treatment well    Behavior During Therapy  [facility] for tasks assessed/performed       Past Medical History:  Diagnosis Date  . Atrophic vaginitis   . Dysplasia of vagina    vin 1 - vulva and vaginal cuff  . GERD (gastroesophageal reflux disease)   . HCV infection   . HSV (herpes simplex virus) infection   . Hypercholesterolemia   . Hypertension   . Hypothyroidism   . Left-sided weakness    S/P CVA 4/18.  PT 2x/wk.  . Lung nodule    stable  . Menopausal state   . Osteoarthritis    hand involvement, cervical disc disease  . Osteoporosis   . Pelvic pain in female   . Rheumatoid arthritis ([place])   . Stroke (cerebrum) ([place]) 04/24/2017  . Thyroid nodule   . Vaginal Pap smear, abnormal    lgsil    Past Surgical History:  Procedure Laterality Date  . ADENOIDECTOMY    . APPENDECTOMY  1965  . [place]   left  . CATARACT EXTRACTION W/PHACO Right 10/27/2017   Procedure: CATARACT EXTRACTION PHACO AND INTRAOCULAR LENS PLACEMENT ([place]) RIGHT;  Surgeon: Leandrew Koyanagi, MD;  Location: [place];  Service: Ophthalmology;  Laterality: Right;  requests early (8-8:30 arrival)  . CATARACT EXTRACTION W/PHACO Left 11/10/2017   Procedure: CATARACT EXTRACTION PHACO AND INTRAOCULAR LENS PLACEMENT ([place]) LEFT;  Surgeon: Leandrew Koyanagi, MD;  Location: [place];  Service: Ophthalmology;   Laterality: Left;  . CHOLECYSTECTOMY    . [place]  . laser vein surgery    . LOOP RECORDER INSERTION N/A 09/24/2017   Procedure: LOOP RECORDER INSERTION;  Surgeon: Evans Lance, MD;  Location: [place] CV LAB;  Service: Cardiovascular;  Laterality: N/A;  . MANDIBLE RECONSTRUCTION  1992  . TOENAIL EXCISION  1998   several  . [place]  . VAGINAL HYSTERECTOMY  1983   als0- anterior/posterior colporrhaphy  . VAGINAL WOUND CLOSURE / REPAIR  1999, 2000  . VAGINAL WOUND CLOSURE / REPAIR      There were no vitals filed for this visit.  Subjective Assessment - 02/22/18 1159    Subjective   Pt. reports she sees Dr. Roland Rack next week.  (Pended)     Patient is accompained by:  Family member  (Pended)     Pertinent History  Pt. is a 72 y.o. female who had a CVA on 04/24/2017. Pt. went to the ER when she started not feeling well, and having heart palpitations. Pt. reports having work up initially, however was sent home. Pt. reports later in the day she was unable to write with her left hand. Pt. returned to the ER, and was admitted with workup for a CVA. Pt. was admitted to [facility], and stayed  for approximately 3 weeks. Pt. returned home alone, and  received home health services for the past 2 months. Pt. now presents for Outpatient OT services. Pt. resides home alone, and has support children who stop by daily, assist with shopping, transportation, and  ADL/IADL tasks as needed.   (Pended)     Currently in Pain?  No/denies  (Pended)       OT TREATMENT    Selfcare:  Pt. worked on using her LUE to engage in hand to face patterns in preparation for self-grooming tasks, hand to mouth patterns in preparation for self-feeding skills. Pt. Pt. worked on using her LUE in preparation for washing her RUE,  applying lotion, and washing her LEs.                        OT Education - 02/22/18 1514    Education provided  Yes    Education  Details  UE functioning.    Person(s) Educated  Patient    Methods  Demonstration;Explanation;Verbal cues    Comprehension  Verbalized understanding;Verbal cues required;Need further instruction;Returned demonstration          OT Long Term Goals - 01/04/18 1149      OT LONG TERM GOAL #1   Title  Pt. will tolerate  shoulder PROM with pain improved to under 3/10    Baseline  Limited by pain    Time  12    Status  On-going    Target Date  03/29/18      OT LONG TERM GOAL #2   Title  Pt. will formulate a full fist to be able to stabilize objects with her Left hand while using her right hand.     Baseline  limited    Time  12    Period  Weeks    Status  On-going    Target Date  03/29/18      OT LONG TERM GOAL #3   Title  Pt. will be able to wipe face independently with her Left UE.    Baseline  Pt. is unable 10/11/2017: hand-over hand assist required    Time  12    Status  On-going    Target Date  03/29/18      OT LONG TERM GOAL #4   Title  Pt. will demonstrate independence with splint application.    Baseline  Pt. is unable to donn splint 10/11/2017: assist required    Time  12    Period  Weeks    Status  On-going    Target Date  03/29/18      OT LONG TERM GOAL #5   Title  Pt. will initiate active grasp release with thumb to 2nd digit 100% of the time to be able to hold a utensil in preparation for self-feeding.    Baseline  Minimal thumb movement. 10/11/2018: Pt. conitnues to have limited thumb motion.    Time  12    Period  Weeks    Status  On-going    Target Date  03/29/18      OT LONG TERM GOAL #6   Title  Pt. will initiate wrist extension in preparation for functional reaching.    Baseline  No active wrist extension. 10/11/2018: inconsistently initiating wrist extension.    Time  12    Period  Weeks    Status  On-going    Target Date  03/29/18  Plan - 02/22/18 1516    Clinical Impression Statement  Pt. was able to engage her LUE in hand to  mouth, and face patterns in preparation for self-grooming, and feeding tasks. Pt. worked to prepare her LUE to wash her RUE,  apply lotion to her RUE, and prepare for washing her LE's. Pt. education was provided about improving LUE engagement in ADLs, and IADL tasks.    Occupational performance deficits (Please refer to evaluation for details):  ADL's;IADL's    Rehab Potential  Good    OT Frequency  2x / week    OT Duration  12 weeks    OT Treatment/Interventions  Self-care/ADL training;Therapeutic exercise;Patient/family education;DME and/or AE instruction;Therapeutic activities    Clinical Decision Making  Several treatment options, min-mod task modification necessary    Consulted and Agree with Plan of Care  Patient       Patient will benefit from skilled therapeutic intervention in order to improve the following deficits and impairments:  Decreased cognition, Difficulty walking, Decreased balance, Decreased strength, Decreased safety awareness, Decreased coordination, Impaired UE functional use, Impaired tone, Decreased range of motion, Decreased endurance, Decreased activity tolerance, Impaired perceived functional ability, Pain  Visit Diagnosis: Muscle weakness (generalized)    Problem List Patient Active Problem List   Diagnosis Date Noted  . Left arm pain 11/05/2017  . Adhesive capsulitis of left shoulder 08/06/2017  . Gait disturbance, post-stroke 05/31/2017  . Left spastic hemiparesis ([place]) 05/31/2017  . Adjustment disorder with mixed anxiety and depressed mood   . Hypokalemia   . 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 Benign essential HTN   . Rheumatoid arthritis involving both hands ([place]) 04/28/2017  . Right-sided lacunar infarction 04/27/2017  . CVA (cerebral vascular accident) ([place]) 04/24/2017  . LGSIL Pap smear of vagina 12/15/2016  . Anal skin tag 02/02/2016  . Inflammatory arthritis 01/19/2016  . Right knee pain 01/19/2016  . Genital herpes 10/20/2015  .  GERD (gastroesophageal reflux disease) 10/20/2015  . Status post vaginal hysterectomy 10/20/2015  . Health care maintenance 05/30/2015  . Menopausal symptoms 03/31/2014  . Hypercholesterolemia 12/31/2012  . Hypothyroidism 12/29/2012  . Osteoarthritis 12/29/2012  . PULMONARY NODULE 01/02/2008  . Mild depression ([place]) 12/30/2007  . Essential hypertension 12/30/2007    Harrel Carina, MS, OTR/L 02/22/2018, 3:29 PM  [place] MAIN [facility] SERVICES [address] [place], [place], [address] Phone: [phone]   Fax:  [phone]  Name: Donna Rhodes MRN: [id number] Date of Birth: Jan 13, 1946

## 2018-02-24 ENCOUNTER — Ambulatory Visit (INDEPENDENT_AMBULATORY_CARE_PROVIDER_SITE_OTHER): Payer: PPO | Admitting: *Deleted

## 2018-02-24 DIAGNOSIS — I639 Cerebral infarction, unspecified: Secondary | ICD-10-CM | POA: Diagnosis not present

## 2018-02-25 ENCOUNTER — Other Ambulatory Visit: Payer: Self-pay

## 2018-02-25 ENCOUNTER — Encounter: Payer: PPO | Attending: Physical Medicine & Rehabilitation

## 2018-02-25 ENCOUNTER — Encounter: Payer: Self-pay | Admitting: Physical Medicine & Rehabilitation

## 2018-02-25 ENCOUNTER — Ambulatory Visit: Payer: PPO | Admitting: Physical Medicine & Rehabilitation

## 2018-02-25 VITALS — BP 133/76 | HR 72

## 2018-02-25 DIAGNOSIS — G8114 Spastic hemiplegia affecting left nondominant side: Secondary | ICD-10-CM | POA: Diagnosis not present

## 2018-02-25 DIAGNOSIS — M792 Neuralgia and neuritis, unspecified: Secondary | ICD-10-CM

## 2018-02-25 DIAGNOSIS — M7989 Other specified soft tissue disorders: Secondary | ICD-10-CM | POA: Diagnosis not present

## 2018-02-25 DIAGNOSIS — Z7982 Long term (current) use of aspirin: Secondary | ICD-10-CM | POA: Insufficient documentation

## 2018-02-25 DIAGNOSIS — I10 Essential (primary) hypertension: Secondary | ICD-10-CM | POA: Diagnosis not present

## 2018-02-25 DIAGNOSIS — I69354 Hemiplegia and hemiparesis following cerebral infarction affecting left non-dominant side: Secondary | ICD-10-CM | POA: Diagnosis not present

## 2018-02-25 DIAGNOSIS — G5692 Unspecified mononeuropathy of left upper limb: Secondary | ICD-10-CM

## 2018-02-25 DIAGNOSIS — M7502 Adhesive capsulitis of left shoulder: Secondary | ICD-10-CM | POA: Diagnosis not present

## 2018-02-25 DIAGNOSIS — M069 Rheumatoid arthritis, unspecified: Secondary | ICD-10-CM | POA: Diagnosis not present

## 2018-02-25 DIAGNOSIS — M7542 Impingement syndrome of left shoulder: Secondary | ICD-10-CM | POA: Diagnosis not present

## 2018-02-25 DIAGNOSIS — R269 Unspecified abnormalities of gait and mobility: Secondary | ICD-10-CM | POA: Diagnosis not present

## 2018-02-25 DIAGNOSIS — Z7902 Long term (current) use of antithrombotics/antiplatelets: Secondary | ICD-10-CM | POA: Diagnosis not present

## 2018-02-25 DIAGNOSIS — K219 Gastro-esophageal reflux disease without esophagitis: Secondary | ICD-10-CM | POA: Insufficient documentation

## 2018-02-25 DIAGNOSIS — E785 Hyperlipidemia, unspecified: Secondary | ICD-10-CM | POA: Insufficient documentation

## 2018-02-25 DIAGNOSIS — R531 Weakness: Secondary | ICD-10-CM | POA: Insufficient documentation

## 2018-02-25 MED ORDER — GABAPENTIN 100 MG PO CAPS: 100.0000 mg | ORAL_CAPSULE | Freq: Four times a day (QID) | ORAL | 1 refills | Status: DC

## 2018-02-25 NOTE — Progress Notes (Signed)
Subjective:    Patient ID: Donna Rhodes, female    DOB: 1946/08/23, 72 y.o.   MRN: [id number]  HPI  Patient is pleased with the results of her botulinum toxin injection.  She continues to go to outpatient therapy for OT Patient states that she can grasp but not release objects  Easier. Patient has shoulder pain when elevating it above 90 degrees.  At rest she does not have any shoulder pain.  She thinks the gabapentin is helping a little bit at 100 mg 4 times daily she has had no side effects. 2/8 Biceps50 FCR25 FCU0 FDS25 FDP25 FPL25 Brachioradialis 50  Hand gripping better post botox but unable to relax fingers  Shoulder pain mainly with elevation >90deg  No pain with bathing or dressing   Pain Inventory Average Pain 0 Pain Right Now 0 My pain is sharp and stabbing  In the last 24 hours, has pain interfered with the following? General activity 10 Relation with others 0 Enjoyment of life 2 What TIME of day is your pain at its worst? varies Sleep (in general) Fair  Pain is worse with: some activites Pain improves with: rest and therapy/exercise Relief from Meds: no pain med  Mobility walk without assistance ability to climb steps?  yes do you drive?  yes  Function retired I need assistance with the following:  household duties  Neuro/Psych numbness  Prior Studies Any changes since last visit?  no  Physicians involved in your care Any changes since last visit?  no   Family History  Problem Relation Age of Onset  . Hypertension Father   . Dementia Father   . Osteoarthritis Mother   . Arthritis/Rheumatoid Sister   . Breast cancer Unknown        paternal side  . Breast cancer Paternal Aunt   . Breast cancer Cousin        maternal  . Uterine cancer Maternal Grandmother   . Ovarian cancer Neg Hx   . Heart disease Neg Hx   . Diabetes Neg Hx   . Colon cancer Neg Hx    Social History   Socioeconomic History  . Marital status: Divorced   Spouse name: None  . Number of children: 3  . Years of education: 5  . Highest education level: None  Social Needs  . Financial resource strain: None  . Food insecurity - worry: None  . Food insecurity - inability: None  . Transportation needs - medical: None  . Transportation needs - non-medical: None  Occupational History    Comment: retired  Tobacco Use  . Smoking status: Former Smoker    Last attempt to quit: 12/29/1979    Years since quitting: 38.1  . Smokeless tobacco: Never Used  Substance and Sexual Activity  . Alcohol use: No    Alcohol/week: 0.0 oz  . Drug use: No  . Sexual activity: Not Currently    Birth control/protection: Surgical  Other Topics Concern  . None  Social History Narrative   Lives alone   Caffeine- 1 cup coffee   Past Surgical History:  Procedure Laterality Date  . ADENOIDECTOMY    . APPENDECTOMY  1965  . [place]   left  . CATARACT EXTRACTION W/PHACO Right 10/27/2017   Procedure: CATARACT EXTRACTION PHACO AND INTRAOCULAR LENS PLACEMENT ([place]) RIGHT;  Surgeon: Leandrew Koyanagi, MD;  Location: [place];  Service: Ophthalmology;  Laterality: Right;  requests early (8-8:30 arrival)  . CATARACT EXTRACTION W/PHACO Left 11/10/2017  Procedure: CATARACT EXTRACTION PHACO AND INTRAOCULAR LENS PLACEMENT ([place]) LEFT;  Surgeon: Leandrew Koyanagi, MD;  Location: [place];  Service: Ophthalmology;  Laterality: Left;  . CHOLECYSTECTOMY    . [place]  . laser vein surgery    . LOOP RECORDER INSERTION N/A 09/24/2017   Procedure: LOOP RECORDER INSERTION;  Surgeon: Evans Lance, MD;  Location: [place] CV LAB;  Service: Cardiovascular;  Laterality: N/A;  . MANDIBLE RECONSTRUCTION  1992  . TOENAIL EXCISION  1998   several  . [place]  . VAGINAL HYSTERECTOMY  1983   als0- anterior/posterior colporrhaphy  . VAGINAL WOUND CLOSURE / REPAIR  1999, 2000  . VAGINAL WOUND CLOSURE / REPAIR     Past  Medical History:  Diagnosis Date  . Atrophic vaginitis   . Dysplasia of vagina    vin 1 - vulva and vaginal cuff  . GERD (gastroesophageal reflux disease)   . HCV infection   . HSV (herpes simplex virus) infection   . Hypercholesterolemia   . Hypertension   . Hypothyroidism   . Left-sided weakness    S/P CVA 4/18.  PT 2x/wk.  . Lung nodule    stable  . Menopausal state   . Osteoarthritis    hand involvement, cervical disc disease  . Osteoporosis   . Pelvic pain in female   . Rheumatoid arthritis ([place])   . Stroke (cerebrum) ([place]) 04/24/2017  . Thyroid nodule   . Vaginal Pap smear, abnormal    lgsil   BP 133/76   Pulse 72   LMP 12/29/1981   SpO2 96%   Opioid Risk Score:   Fall Risk Score:  `1  Depression screen PHQ 2/9  Depression screen [facility] 2/9 02/25/2018 05/26/2017 10/23/2016 09/30/2015 05/31/2015  Decreased Interest 0 0 0 0 0  Down, Depressed, Hopeless 0 0 0 0 0  PHQ - 2 Score 0 0 0 0 0  Some recent data might be hidden    Review of Systems  Constitutional: Negative.   HENT: Negative.   Eyes: Negative.   Respiratory: Negative.   Cardiovascular: Negative.   Gastrointestinal: Negative.   Endocrine: Negative.   Genitourinary: Negative.   Musculoskeletal: Negative.   Skin: Negative.   Allergic/Immunologic: Negative.   Neurological: Positive for numbness.  Hematological: Negative.   Psychiatric/Behavioral: Negative.   All other systems reviewed and are negative.      Objective:   Physical Exam  Constitutional: She is oriented to person, place, and time. She appears well-developed and well-nourished. No distress.  HENT:  Head: Normocephalic and atraumatic.  Eyes: Conjunctivae and EOM are normal. Pupils are equal, round, and reactive to light.  Neck: Normal range of motion.  Musculoskeletal:  Pain with arm elevation at approximately 90 degrees of abduction.  Pain is in the subacromial area.  No pain with wrist or elbow range of motion there is also pain with  external rotation of the shoulder at around 20 degrees.  Neurological: She is alert and oriented to person, place, and time.  Motor strength is 3- at the left deltoid 3- at the bicep to minus tricep 3- finger flexors 0 finger extensors 0 hand intrinsics Fine motor reduce finger to thumb opposition.  Skin: She is not diaphoretic.  Psychiatric: She has a normal mood and affect.  Nursing note and vitals reviewed.         Assessment & Plan:  #1.  Left spastic hemiplegia secondary to right MCA infarct.  Overall has  had good functional recovery still has weakness of left upper extremity which I think at this point we will not improve to any substantial degree.  We discussed this in some detail. Her spasticity is well managed at the current dose of Botox will repeat at the beginning of May.  2.  Chronic left neuropathic shoulder pain.  She has elements of rotator cuff tear as well as adhesive capsulitis.  Some movement allodynia.  Overall her complaints have diminished.  We will continue her gabapentin 100 mg 4 times daily.  If she has worsening of symptoms we could either increase her gabapentin dose or switch to Lyrica.  We will hold off on any changes at the current time however

## 2018-02-25 NOTE — Progress Notes (Signed)
Carelink Summary Report / Loop Recorder 

## 2018-02-25 NOTE — Patient Instructions (Signed)
Please call us if shoulder pain increases

## 2018-02-27 ENCOUNTER — Other Ambulatory Visit: Payer: Self-pay | Admitting: Internal Medicine

## 2018-02-28 DIAGNOSIS — M75122 Complete rotator cuff tear or rupture of left shoulder, not specified as traumatic: Secondary | ICD-10-CM | POA: Diagnosis not present

## 2018-02-28 DIAGNOSIS — M7582 Other shoulder lesions, left shoulder: Secondary | ICD-10-CM | POA: Insufficient documentation

## 2018-03-01 ENCOUNTER — Encounter: Payer: Self-pay | Admitting: Occupational Therapy

## 2018-03-01 ENCOUNTER — Ambulatory Visit: Payer: PPO | Attending: Physical Medicine & Rehabilitation | Admitting: Occupational Therapy

## 2018-03-01 DIAGNOSIS — M6281 Muscle weakness (generalized): Secondary | ICD-10-CM | POA: Insufficient documentation

## 2018-03-01 NOTE — Therapy (Signed)
[place] MAIN [facility] SERVICES [address] [place], [place], [address] Phone: [phone]   Fax:  [phone]  Occupational Therapy Treatment/Discharge Note  Patient Details  Name: Donna Rhodes MRN: [id number] Date of Birth: 12/07/1946 Referring Provider: Alysia Penna   Encounter Date: 03/01/2018  OT End of Session - 03/01/18 1624    Visit Number  13    Number of Visits  48    Date for OT Re-Evaluation  03/29/18    OT Start Time  1440    OT Stop Time  1520    OT Time Calculation (min)  40 min    Activity Tolerance  Patient tolerated treatment well    Behavior During Therapy  [facility] for tasks assessed/performed       Past Medical History:  Diagnosis Date  . Atrophic vaginitis   . Dysplasia of vagina    vin 1 - vulva and vaginal cuff  . GERD (gastroesophageal reflux disease)   . HCV infection   . HSV (herpes simplex virus) infection   . Hypercholesterolemia   . Hypertension   . Hypothyroidism   . Left-sided weakness    S/P CVA 4/18.  PT 2x/wk.  . Lung nodule    stable  . Menopausal state   . Osteoarthritis    hand involvement, cervical disc disease  . Osteoporosis   . Pelvic pain in female   . Rheumatoid arthritis ([place])   . Stroke (cerebrum) ([place]) 04/24/2017  . Thyroid nodule   . Vaginal Pap smear, abnormal    lgsil    Past Surgical History:  Procedure Laterality Date  . ADENOIDECTOMY    . APPENDECTOMY  1965  . [place]   left  . CATARACT EXTRACTION W/PHACO Right 10/27/2017   Procedure: CATARACT EXTRACTION PHACO AND INTRAOCULAR LENS PLACEMENT ([place]) RIGHT;  Surgeon: Leandrew Koyanagi, MD;  Location: [place];  Service: Ophthalmology;  Laterality: Right;  requests early (8-8:30 arrival)  . CATARACT EXTRACTION W/PHACO Left 11/10/2017   Procedure: CATARACT EXTRACTION PHACO AND INTRAOCULAR LENS PLACEMENT ([place]) LEFT;  Surgeon: Leandrew Koyanagi, MD;  Location: [place];  Service:  Ophthalmology;  Laterality: Left;  . CHOLECYSTECTOMY    . [place]  . laser vein surgery    . LOOP RECORDER INSERTION N/A 09/24/2017   Procedure: LOOP RECORDER INSERTION;  Surgeon: Evans Lance, MD;  Location: [place] CV [facility];  Service: Cardiovascular;  Laterality: N/A;  . MANDIBLE RECONSTRUCTION  1992  . TOENAIL EXCISION  1998   several  . [place]  . VAGINAL HYSTERECTOMY  1983   als0- anterior/posterior colporrhaphy  . VAGINAL WOUND CLOSURE / REPAIR  1999, 2000  . VAGINAL WOUND CLOSURE / REPAIR      There were no vitals filed for this visit.  Subjective Assessment - 03/01/18 1623    Patient is accompained by:  Family member    Pertinent History  Pt. is a 72 y.o. female who had a CVA on 04/24/2017. Pt. went to the ER when she started not feeling well, and having heart palpitations. Pt. reports having work up initially, however was sent home. Pt. reports later in the day she was unable to write with her left hand. Pt. returned to the ER, and was admitted with workup for a CVA. Pt. was admitted to [facility], and stayed for approximately 3 weeks. Pt. returned home alone, and  received home health services for the past 2 months.  Pt. now presents for Outpatient OT services. Pt. resides home alone, and has support children who stop by daily, assist with shopping, transportation, and  ADL/IADL tasks as needed.     Limitations  Pt. is unable to use her dominant RUE during functional tasks.    Patient Stated Goals  To get back the way she was. (Independent)        OT TREATMENT    Therapeutic Exercise:  Pt. education was provided about LUE functioning,  exercises, and activities to complete at home.                           OT Long Term Goals - 01/04/18 1149      OT LONG TERM GOAL #1   Title  Pt. will tolerate  shoulder PROM with pain improved to under 3/10    Baseline  Limited by pain    Time  12    Status   On-going    Target Date  03/29/18      OT LONG TERM GOAL #2   Title  Pt. will formulate a full fist to be able to stabilize objects with her Left hand while using her right hand.     Baseline  limited    Time  12    Period  Weeks    Status  On-going    Target Date  03/29/18      OT LONG TERM GOAL #3   Title  Pt. will be able to wipe face independently with her Left UE.    Baseline  Pt. is unable 10/11/2017: hand-over hand assist required    Time  12    Status  On-going    Target Date  03/29/18      OT LONG TERM GOAL #4   Title  Pt. will demonstrate independence with splint application.    Baseline  Pt. is unable to donn splint 10/11/2017: assist required    Time  12    Period  Weeks    Status  On-going    Target Date  03/29/18      OT LONG TERM GOAL #5   Title  Pt. will initiate active grasp release with thumb to 2nd digit 100% of the time to be able to hold a utensil in preparation for self-feeding.    Baseline  Minimal thumb movement. 10/11/2018: Pt. conitnues to have limited thumb motion.    Time  12    Period  Weeks    Status  On-going    Target Date  03/29/18      OT LONG TERM GOAL #6   Title  Pt. will initiate wrist extension in preparation for functional reaching.    Baseline  No active wrist extension. 10/11/2018: inconsistently initiating wrist extension.    Time  12    Period  Weeks    Status  On-going    Target Date  03/29/18            Plan - 03/01/18 1624    Clinical Impression Statement  Pt. went to see Dr. Roland Rack, and reports she is going to have her MRI done, and plans to follow-up with his in a few weeks. Pt. follows up with Dr. Letta Pate again as well in a few weeks. Pt. reports she is still pursuing the percutaneuos stimulator. Discussed with pt. at length about her LUE functional staus, exercises to continue working with at home. Pt. was encouraged to continue attempting  to engage her LUE in ADL, tasks. Reviewed the appropriateness for discharge  from OT services at this time secondary to having a plateau with her LUE functioning at this time. Pt. continues to have multiple comorbidities which are affecting progress with her LUE at this time. Pt. was in agreement, however was tearful intermittantly throughout the session. Pt. was provided with education about our Stroke club support group. Pt. was provided with information club topics.     Occupational Profile and client history currently impacting functional performance  Pt. resides home alone, is retired, and has supprotive family.    Occupational performance deficits (Please refer to evaluation for details):  ADL's;IADL's    Rehab Potential  Good    OT Frequency  2x / week    OT Duration  12 weeks    OT Treatment/Interventions  Self-care/ADL training;Therapeutic exercise;Patient/family education;DME and/or AE instruction;Therapeutic activities    Clinical Decision Making  Several treatment options, min-mod task modification necessary    Consulted and Agree with Plan of Care  Patient       Patient will benefit from skilled therapeutic intervention in order to improve the following deficits and impairments:  Decreased cognition, Difficulty walking, Decreased balance, Decreased strength, Decreased safety awareness, Decreased coordination, Impaired UE functional use, Impaired tone, Decreased range of motion, Decreased endurance, Decreased activity tolerance, Impaired perceived functional ability, Pain  Visit Diagnosis: Muscle weakness (generalized)    Problem List Patient Active Problem List   Diagnosis Date Noted  . Left arm pain 11/05/2017  . Adhesive capsulitis of left shoulder 08/06/2017  . Gait disturbance, post-stroke 05/31/2017  . Left spastic hemiparesis ([place]) 05/31/2017  . Adjustment disorder with mixed anxiety and depressed mood   . Hypokalemia   . 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 Benign essential HTN   . Rheumatoid arthritis involving both hands ([place])  04/28/2017  . Right-sided lacunar infarction 04/27/2017  . CVA (cerebral vascular accident) ([place]) 04/24/2017  . LGSIL Pap smear of vagina 12/15/2016  . Anal skin tag 02/02/2016  . Inflammatory arthritis 01/19/2016  . Right knee pain 01/19/2016  . Genital herpes 10/20/2015  . GERD (gastroesophageal reflux disease) 10/20/2015  . Status post vaginal hysterectomy 10/20/2015  . Health care maintenance 05/30/2015  . Menopausal symptoms 03/31/2014  . Hypercholesterolemia 12/31/2012  . Hypothyroidism 12/29/2012  . Osteoarthritis 12/29/2012  . PULMONARY NODULE 01/02/2008  . Mild depression ([place]) 12/30/2007  . Essential hypertension 12/30/2007    Harrel Carina, MS, OTR/L 03/01/2018, 4:45 PM  [place] MAIN [facility] SERVICES [address] [place], [place], [address] Phone: [phone]   Fax:  [phone]  Name: [place] FLETT MRN: [id number] Date of Birth: 1946/09/26

## 2018-03-03 ENCOUNTER — Encounter: Payer: Self-pay | Admitting: Internal Medicine

## 2018-03-03 ENCOUNTER — Ambulatory Visit (INDEPENDENT_AMBULATORY_CARE_PROVIDER_SITE_OTHER): Payer: PPO | Admitting: Internal Medicine

## 2018-03-03 VITALS — BP 122/64 | HR 66 | Ht 64.0 in | Wt 112.0 lb

## 2018-03-03 DIAGNOSIS — I639 Cerebral infarction, unspecified: Secondary | ICD-10-CM | POA: Diagnosis not present

## 2018-03-03 DIAGNOSIS — R002 Palpitations: Secondary | ICD-10-CM | POA: Diagnosis not present

## 2018-03-03 LAB — CUP PACEART INCLINIC DEVICE CHECK
Date Time Interrogation Session: 20190307171336
Implantable Pulse Generator Implant Date: 20180928

## 2018-03-03 NOTE — Patient Instructions (Signed)

## 2018-03-03 NOTE — Progress Notes (Signed)
HPI Mrs. Belleau returns today for ongoing followup of her ILR, inserted after experiencing a cryptogenic stroke. She has undergone PT and has recovered a little bit of function though her left hand is still not very usable. She denies chest pain or sob. No syncope. No edema. Allergies  Allergen Reactions  . Amoxicillin Other (See Comments)    Has patient had a PCN reaction causing immediate rash, facial/tongue/throat swelling, SOB or lightheadedness with hypotension: No Has patient had a PCN reaction causing severe rash involving mucus membranes or skin necrosis: No Has patient had a PCN reaction that required hospitalization: No Has patient had a PCN reaction occurring within the last 10 years: No If all of the above answers are "NO", then may proceed with Cephalosporin use.   Irritated tongue  . Avelox [Moxifloxacin Hcl In Nacl]     Tongue irritation  . Azithromycin Other (See Comments)    Caused mouth and tongue to be sore  . Codeine     "feels like I'm in a barrel"  . [place] [Nutritional Supplements]     Mouth soreness  . Food     Walnuts --mouth breaks out  . Moxifloxacin Other (See Comments)    Tongue irritation  . Naproxen     Tongue irritation  . Nsaids Other (See Comments)    Oral inflammation  . Cefepime Rash     Current Outpatient Medications  Medication Sig Dispense Refill  . acetaminophen (TYLENOL) 500 MG tablet Take 500-1,000 mg by mouth every 6 (six) hours as needed for mild pain.    Marland Kitchen acyclovir (ZOVIRAX) 200 MG capsule TAKE ONE CAPSULE BY MOUTH TWICE DAILY 180 capsule 3  . amLODipine (NORVASC) 2.5 MG tablet Take 2.5 mg by mouth daily.     Marland Kitchen atorvastatin (LIPITOR) 20 MG tablet Take 1 tablet (20 mg total) by mouth daily. 90 tablet 1  . calcium-vitamin D (OSCAL WITH D) 250-125 MG-UNIT tablet Take 1 tablet by mouth daily.    . citalopram (CELEXA) 10 MG tablet TAKE 1 TABLET BY MOUTH ONCE DAILY 30 tablet 2  . clopidogrel (PLAVIX) 75  MG tablet Take 1 tablet (75 mg total) by mouth daily. 90 tablet 1  . folic acid (FOLVITE) 1 MG tablet Take 1 mg by mouth daily.    Marland Kitchen gabapentin (NEURONTIN) 100 MG capsule Take 1 capsule (100 mg total) by mouth 4 (four) times daily. 120 capsule 1  . levothyroxine (SYNTHROID, LEVOTHROID) 50 MCG tablet TAKE 1 TABLET BY MOUTH ONCE DAILY BEFORE BREAKFAST 90 tablet 1  . methotrexate 50 MG/2ML injection Inject 0.71mL under the skin once a weekly on Wednesday    . Multiple Vitamin (MULTIVITAMIN WITH MINERALS) TABS tablet Take 1 tablet by mouth daily.    . pantoprazole (PROTONIX) 20 MG tablet Take 1 tablet (20 mg total) by mouth daily. 30 tablet 3   No current facility-administered medications for this visit.      Past Medical History:  Diagnosis Date  . Atrophic vaginitis   . Dysplasia of vagina    vin 1 - vulva and vaginal cuff  . GERD (gastroesophageal reflux disease)   . HCV infection   . HSV (herpes simplex virus) infection   . Hypercholesterolemia   . Hypertension   . Hypothyroidism   . Left-sided weakness    S/P CVA 4/18.  PT 2x/wk.  . Lung nodule    stable  . Menopausal state   . Osteoarthritis    hand involvement,  cervical disc disease  . Osteoporosis   . Pelvic pain in female   . Rheumatoid arthritis ([place])   . Stroke (cerebrum) ([place]) 04/24/2017  . Thyroid nodule   . Vaginal Pap smear, abnormal    lgsil    ROS:   All systems reviewed and negative except as noted in the HPI.   Past Surgical History:  Procedure Laterality Date  . ADENOIDECTOMY    . APPENDECTOMY  1965  . [place]   left  . CATARACT EXTRACTION W/PHACO Right 10/27/2017   Procedure: CATARACT EXTRACTION PHACO AND INTRAOCULAR LENS PLACEMENT ([place]) RIGHT;  Surgeon: Leandrew Koyanagi, MD;  Location: [place];  Service: Ophthalmology;  Laterality: Right;  requests early (8-8:30 arrival)  . CATARACT EXTRACTION W/PHACO Left 11/10/2017   Procedure: CATARACT EXTRACTION PHACO AND INTRAOCULAR  LENS PLACEMENT ([place]) LEFT;  Surgeon: Leandrew Koyanagi, MD;  Location: [place];  Service: Ophthalmology;  Laterality: Left;  . CHOLECYSTECTOMY    . [place]  . laser vein surgery    . LOOP RECORDER INSERTION N/A 09/24/2017   Procedure: LOOP RECORDER INSERTION;  Surgeon: Evans Lance, MD;  Location: [place] [facility];  Service: Cardiovascular;  Laterality: N/A;  . MANDIBLE RECONSTRUCTION  1992  . TOENAIL EXCISION  1998   several  . [place]  . VAGINAL HYSTERECTOMY  1983   als0- anterior/posterior colporrhaphy  . VAGINAL WOUND CLOSURE / REPAIR  1999, 2000  . VAGINAL WOUND CLOSURE / REPAIR       Family History  Problem Relation Age of Onset  . Hypertension Father   . Dementia Father   . Osteoarthritis Mother   . Arthritis/Rheumatoid Sister   . Breast cancer Unknown        paternal side  . Breast cancer Paternal Aunt   . Breast cancer Cousin        maternal  . Uterine cancer Maternal Grandmother   . Ovarian cancer Neg Hx   . Heart disease Neg Hx   . Diabetes Neg Hx   . Colon cancer Neg Hx      Social History   Socioeconomic History  . Marital status: Divorced    Spouse name: Not on file  . Number of children: 3  . Years of education: 25  . Highest education level: Not on file  Social Needs  . Financial resource strain: Not on file  . Food insecurity - worry: Not on file  . Food insecurity - inability: Not on file  . Transportation needs - medical: Not on file  . Transportation needs - non-medical: Not on file  Occupational History    Comment: retired  Tobacco Use  . Smoking status: Former Smoker    Last attempt to quit: 12/29/1979    Years since quitting: 38.2  . Smokeless tobacco: Never Used  Substance and Sexual Activity  . Alcohol use: No    Alcohol/week: 0.0 oz  . Drug use: No  . Sexual activity: Not Currently    Birth control/protection: Surgical  Other Topics Concern  . Not on file  Social History Narrative    Lives alone   Caffeine- 1 cup coffee     BP 122/64   Pulse 66   Ht 5\' 4"  (1.626 m)   Wt 112 lb (50.8 kg)   LMP 12/29/1981   BMI 19.22 kg/m   Physical Exam:  Well appearing 72 yo woman, NAD HEENT: Unremarkable Neck:  6 cm JVD, no thyromegally Lymphatics:  No adenopathy Back:  No CVA tenderness Lungs:  Clear with no wheezes HEART:  Regular rate rhythm, no murmurs, no rubs, no clicks Abd:  soft, positive bowel sounds, no organomegally, no rebound, no guarding Ext:  2 plus pulses, no edema, no cyanosis, no clubbing Skin:  No rashes no nodules Neuro:  CN II through XII intact, motor grossly intact  EKG - NSR  DEVICE  Normal device function.  See PaceArt for details.   Assess/Plan: 1. Cryptogenic stroke - she has had no atrial fib. I have asked to undergo watchful waiting. No evidence of atrial fib on the monitor. 2. Atrial and ventricular ectopy - she is asymptomatic.  3. HTN - her blood pressure is well controlled. Will follow.  Mikle Bosworth.D.

## 2018-03-04 ENCOUNTER — Other Ambulatory Visit: Payer: Self-pay | Admitting: Surgery

## 2018-03-04 DIAGNOSIS — M7582 Other shoulder lesions, left shoulder: Secondary | ICD-10-CM

## 2018-03-04 DIAGNOSIS — M75122 Complete rotator cuff tear or rupture of left shoulder, not specified as traumatic: Secondary | ICD-10-CM

## 2018-03-07 ENCOUNTER — Encounter: Payer: PPO | Admitting: Occupational Therapy

## 2018-03-08 ENCOUNTER — Ambulatory Visit (INDEPENDENT_AMBULATORY_CARE_PROVIDER_SITE_OTHER): Payer: PPO | Admitting: Internal Medicine

## 2018-03-08 ENCOUNTER — Telehealth: Payer: Self-pay

## 2018-03-08 ENCOUNTER — Encounter: Payer: Self-pay | Admitting: Internal Medicine

## 2018-03-08 ENCOUNTER — Encounter: Payer: PPO | Admitting: Occupational Therapy

## 2018-03-08 VITALS — BP 118/68 | HR 70 | Temp 97.5°F | Resp 18 | Ht 64.0 in | Wt 110.4 lb

## 2018-03-08 DIAGNOSIS — R300 Dysuria: Secondary | ICD-10-CM | POA: Diagnosis not present

## 2018-03-08 LAB — POCT URINALYSIS DIPSTICK
Bilirubin, UA: NEGATIVE
GLUCOSE UA: NEGATIVE
Ketones, UA: 5
Nitrite, UA: NEGATIVE
PH UA: 5.5 (ref 5.0–8.0)
PROTEIN UA: 1
RBC UA: 3
Spec Grav, UA: >=1.03 — AB (ref 1.010–1.025)

## 2018-03-08 LAB — URINALYSIS, MICROSCOPIC ONLY

## 2018-03-08 MED ORDER — DOXYCYCLINE HYCLATE 100 MG PO TABS: 100.0000 mg | ORAL_TABLET | Freq: Two times a day (BID) | ORAL | 0 refills | Status: DC

## 2018-03-08 NOTE — Telephone Encounter (Signed)
Scheduled

## 2018-03-08 NOTE — Telephone Encounter (Signed)
Can you please schedule pt for today at 12pm. Pt is aware of appt date and time.

## 2018-03-08 NOTE — Progress Notes (Signed)
Pre-visit discussion using our clinic review tool. No additional management support is needed unless otherwise documented below in the visit note.  

## 2018-03-08 NOTE — Telephone Encounter (Signed)
Pt called and stated that she has a possible UTI x3 days. Pt is wanting to know if she can come by and leave a urine sample or does she need to be seen?

## 2018-03-08 NOTE — Patient Instructions (Signed)
Take a probiotic daily while you are on the antibiotics and for two weeks after completing the antibiotics.   

## 2018-03-08 NOTE — Progress Notes (Signed)
Patient ID: Donna Rhodes, female   DOB: [birth year], 72 y.o.   MRN: [id number]   Subjective:    Patient ID: Donna Rhodes, female    DOB: February 14, 1946, 72 y.o.   MRN: [id number]  HPI  Patient here as a work in with concerns regarding a possible urinary tract infection.  Has noticed some dysuria.  Some frequency.  No abdominal pain.  Eating.  No vomiting.  Bowels moving.  No diarrhea.  No vaginal symptoms.     Past Medical History:  Diagnosis Date  . Atrophic vaginitis   . Dysplasia of vagina    vin 1 - vulva and vaginal cuff  . GERD (gastroesophageal reflux disease)   . HCV infection   . HSV (herpes simplex virus) infection   . Hypercholesterolemia   . Hypertension   . Hypothyroidism   . Left-sided weakness    S/P CVA 4/18.  PT 2x/wk.  . Lung nodule    stable  . Menopausal state   . Osteoarthritis    hand involvement, cervical disc disease  . Osteoporosis   . Pelvic pain in female   . Rheumatoid arthritis ([place])   . Stroke (cerebrum) ([place]) 04/24/2017  . Thyroid nodule   . Vaginal Pap smear, abnormal    lgsil   Past Surgical History:  Procedure Laterality Date  . ADENOIDECTOMY    . APPENDECTOMY  1965  . [place]   left  . CATARACT EXTRACTION W/PHACO Right 10/27/2017   Procedure: CATARACT EXTRACTION PHACO AND INTRAOCULAR LENS PLACEMENT ([place]) RIGHT;  Surgeon: Leandrew Koyanagi, MD;  Location: [place];  Service: Ophthalmology;  Laterality: Right;  requests early (8-8:30 arrival)  . CATARACT EXTRACTION W/PHACO Left 11/10/2017   Procedure: CATARACT EXTRACTION PHACO AND INTRAOCULAR LENS PLACEMENT ([place]) LEFT;  Surgeon: Leandrew Koyanagi, MD;  Location: [place];  Service: Ophthalmology;  Laterality: Left;  . CHOLECYSTECTOMY    . [place]  . laser vein surgery    . LOOP RECORDER INSERTION N/A 09/24/2017   Procedure: LOOP RECORDER INSERTION;  Surgeon: Evans Lance, MD;  Location: [facility];  Service:  Cardiovascular;  Laterality: N/A;  . MANDIBLE RECONSTRUCTION  1992  . TOENAIL EXCISION  1998   several  . [place]  . VAGINAL HYSTERECTOMY  1983   als0- anterior/posterior colporrhaphy  . VAGINAL WOUND CLOSURE / REPAIR  1999, 2000  . VAGINAL WOUND CLOSURE / REPAIR     Family History  Problem Relation Age of Onset  . Hypertension Father   . Dementia Father   . Osteoarthritis Mother   . Arthritis/Rheumatoid Sister   . Breast cancer Unknown        paternal side  . Breast cancer Paternal Aunt   . Breast cancer Cousin        maternal  . Uterine cancer Maternal Grandmother   . Ovarian cancer Neg Hx   . Heart disease Neg Hx   . Diabetes Neg Hx   . Colon cancer Neg Hx    Social History   Socioeconomic History  . Marital status: Divorced    Spouse name: None  . Number of children: 3  . Years of education: 33  . Highest education level: None  Social Needs  . Financial resource strain: None  . Food insecurity - worry: None  . Food insecurity - inability: None  . Transportation needs - medical: None  . Transportation needs - non-medical: None  Occupational History  Comment: retired  Tobacco Use  . Smoking status: Former Smoker    Last attempt to quit: 12/29/1979    Years since quitting: 38.2  . Smokeless tobacco: Never Used  Substance and Sexual Activity  . Alcohol use: No    Alcohol/week: 0.0 oz  . Drug use: No  . Sexual activity: Not Currently    Birth control/protection: Surgical  Other Topics Concern  . None  Social History Narrative   Lives alone   Caffeine- 1 cup coffee    Outpatient Encounter Medications as of 03/08/2018  Medication Sig  . acetaminophen (TYLENOL) 500 MG tablet Take 500-1,000 mg by mouth every 6 (six) hours as needed for mild pain.  Marland Kitchen acyclovir (ZOVIRAX) 200 MG capsule TAKE ONE CAPSULE BY MOUTH TWICE DAILY  . amLODipine (NORVASC) 2.5 MG tablet Take 2.5 mg by mouth daily.   Marland Kitchen atorvastatin (LIPITOR) 20 MG tablet Take 1 tablet (20  mg total) by mouth daily.  . calcium-vitamin D (OSCAL WITH D) 250-125 MG-UNIT tablet Take 1 tablet by mouth daily.  . citalopram (CELEXA) 10 MG tablet TAKE 1 TABLET BY MOUTH ONCE DAILY  . clopidogrel (PLAVIX) 75 MG tablet Take 1 tablet (75 mg total) by mouth daily.  . folic acid (FOLVITE) 1 MG tablet Take 1 mg by mouth daily.  Marland Kitchen gabapentin (NEURONTIN) 100 MG capsule Take 1 capsule (100 mg total) by mouth 4 (four) times daily.  Marland Kitchen levothyroxine (SYNTHROID, LEVOTHROID) 50 MCG tablet TAKE 1 TABLET BY MOUTH ONCE DAILY BEFORE BREAKFAST  . methotrexate 50 MG/2ML injection Inject 0.85mL under the skin once a weekly on Wednesday  . Multiple Vitamin (MULTIVITAMIN WITH MINERALS) TABS tablet Take 1 tablet by mouth daily.  . pantoprazole (PROTONIX) 20 MG tablet Take 1 tablet (20 mg total) by mouth daily.  Marland Kitchen doxycycline (VIBRA-TABS) 100 MG tablet Take 1 tablet (100 mg total) by mouth 2 (two) times daily.   No facility-administered encounter medications on file as of 03/08/2018.     Review of Systems  Constitutional: Negative for appetite change and fever.  Respiratory: Negative for cough and shortness of breath.   Cardiovascular: Negative for leg swelling.  Gastrointestinal: Negative for abdominal pain, diarrhea, nausea and vomiting.  Genitourinary: Positive for dysuria. Negative for difficulty urinating and vaginal discharge.  Musculoskeletal: Negative for back pain and myalgias.  Skin: Negative for color change and rash.       Objective:    Physical Exam  Constitutional: She appears well-developed and well-nourished. No distress.  Neck: Neck supple.  Cardiovascular: Normal rate and regular rhythm.  Pulmonary/Chest: Breath sounds normal. No respiratory distress. She has no wheezes.  Abdominal: Soft. Bowel sounds are normal. There is no tenderness.  Skin: No rash noted. No erythema.  Psychiatric: She has a normal mood and affect. Her behavior is normal.    BP 118/68 (BP Location: Right Arm,  Patient Position: Sitting, Cuff Size: Normal)   Pulse 70   Temp (!) 97.5 F (36.4 C) (Oral)   Resp 18   Ht 5\' 4"  (1.626 m)   Wt 110 lb 6 oz (50.1 kg)   LMP 12/29/1981   SpO2 98%   BMI 18.95 kg/m  Wt Readings from Last 3 Encounters:  03/08/18 110 lb 6 oz (50.1 kg)  03/03/18 112 lb (50.8 kg)  02/11/18 112 lb 12.8 oz (51.2 kg)     Lab Results  Component Value Date   WBC 6.9 08/16/2017   HGB 12.4 08/16/2017   HCT 37.9 08/16/2017   PLT  252.0 08/16/2017   GLUCOSE 83 12/13/2017   CHOL 149 12/13/2017   TRIG 87.0 12/13/2017   HDL 62.40 12/13/2017   LDLDIRECT 128.0 05/25/2013   LDLCALC 70 12/13/2017   ALT 23 12/13/2017   AST 25 12/13/2017   NA 142 12/13/2017   K 4.5 12/13/2017   CL 105 12/13/2017   CREATININE 0.75 12/13/2017   BUN 16 12/13/2017   CO2 30 12/13/2017   TSH 3.09 12/13/2017   HGBA1C 5.7 (H) 04/25/2017       Assessment & Plan:   Problem List Items Addressed This Visit    None    Visit Diagnoses    Dysuria    -  Primary   Symptoms and urine dip as outlined.  Treat with doxycycline.  Probiotics as directed.  Culture sent.     Relevant Orders   POCT Urinalysis Dipstick (Completed)   Urine Microscopic Only (Completed)   Urine Culture (Completed)       Einar Pheasant, MD

## 2018-03-09 ENCOUNTER — Encounter: Payer: PPO | Admitting: Occupational Therapy

## 2018-03-10 ENCOUNTER — Encounter: Payer: PPO | Admitting: Occupational Therapy

## 2018-03-11 LAB — URINE CULTURE
MICRO NUMBER: 90314512
SPECIMEN QUALITY: ADEQUATE

## 2018-03-14 ENCOUNTER — Encounter: Payer: PPO | Admitting: Occupational Therapy

## 2018-03-15 ENCOUNTER — Encounter: Payer: PPO | Admitting: Occupational Therapy

## 2018-03-16 ENCOUNTER — Ambulatory Visit: Payer: PPO | Admitting: Occupational Therapy

## 2018-03-16 ENCOUNTER — Ambulatory Visit
Admission: RE | Admit: 2018-03-16 | Discharge: 2018-03-16 | Disposition: A | Discharge status: Home / Self Care | Payer: PPO | Source: Ambulatory Visit | Attending: Surgery | Admitting: Surgery

## 2018-03-16 DIAGNOSIS — S46012A Strain of muscle(s) and tendon(s) of the rotator cuff of left shoulder, initial encounter: Secondary | ICD-10-CM | POA: Diagnosis not present

## 2018-03-16 DIAGNOSIS — M7582 Other shoulder lesions, left shoulder: Secondary | ICD-10-CM | POA: Insufficient documentation

## 2018-03-16 DIAGNOSIS — M62512 Muscle wasting and atrophy, not elsewhere classified, left shoulder: Secondary | ICD-10-CM | POA: Diagnosis not present

## 2018-03-16 DIAGNOSIS — M67814 Other specified disorders of tendon, left shoulder: Secondary | ICD-10-CM | POA: Diagnosis not present

## 2018-03-16 DIAGNOSIS — M75122 Complete rotator cuff tear or rupture of left shoulder, not specified as traumatic: Secondary | ICD-10-CM | POA: Insufficient documentation

## 2018-03-17 ENCOUNTER — Encounter: Payer: PPO | Admitting: Occupational Therapy

## 2018-03-18 ENCOUNTER — Ambulatory Visit: Payer: PPO

## 2018-03-18 ENCOUNTER — Ambulatory Visit: Payer: PPO | Admitting: Physical Medicine & Rehabilitation

## 2018-03-21 ENCOUNTER — Ambulatory Visit: Payer: PPO | Admitting: Occupational Therapy

## 2018-03-21 DIAGNOSIS — M75122 Complete rotator cuff tear or rupture of left shoulder, not specified as traumatic: Secondary | ICD-10-CM | POA: Diagnosis not present

## 2018-03-21 DIAGNOSIS — M7582 Other shoulder lesions, left shoulder: Secondary | ICD-10-CM | POA: Diagnosis not present

## 2018-03-22 ENCOUNTER — Encounter: Payer: PPO | Admitting: Occupational Therapy

## 2018-03-23 ENCOUNTER — Encounter: Payer: PPO | Admitting: Occupational Therapy

## 2018-03-25 ENCOUNTER — Other Ambulatory Visit: Payer: Self-pay | Admitting: Internal Medicine

## 2018-03-29 ENCOUNTER — Ambulatory Visit (INDEPENDENT_AMBULATORY_CARE_PROVIDER_SITE_OTHER): Payer: PPO | Admitting: *Deleted

## 2018-03-29 DIAGNOSIS — I639 Cerebral infarction, unspecified: Secondary | ICD-10-CM | POA: Diagnosis not present

## 2018-03-30 NOTE — Progress Notes (Signed)
Carelink Summary Report / Loop Recorder 

## 2018-04-04 LAB — CUP PACEART REMOTE DEVICE CHECK
Implantable Pulse Generator Implant Date: 20180928
MDC IDC SESS DTM: 20190228173911

## 2018-05-02 ENCOUNTER — Ambulatory Visit (INDEPENDENT_AMBULATORY_CARE_PROVIDER_SITE_OTHER): Payer: PPO | Admitting: *Deleted

## 2018-05-02 DIAGNOSIS — I639 Cerebral infarction, unspecified: Secondary | ICD-10-CM | POA: Diagnosis not present

## 2018-05-02 NOTE — Progress Notes (Signed)
Carelink Summary Report / Loop Recorder 

## 2018-05-03 ENCOUNTER — Encounter: Payer: Self-pay | Admitting: Diagnostic Neuroimaging

## 2018-05-03 ENCOUNTER — Ambulatory Visit: Payer: PPO | Admitting: Physical Medicine & Rehabilitation

## 2018-05-03 ENCOUNTER — Ambulatory Visit: Payer: PPO | Admitting: Diagnostic Neuroimaging

## 2018-05-03 ENCOUNTER — Encounter: Payer: Self-pay | Admitting: Physical Medicine & Rehabilitation

## 2018-05-03 ENCOUNTER — Other Ambulatory Visit: Payer: Self-pay

## 2018-05-03 ENCOUNTER — Encounter: Payer: PPO | Attending: Physical Medicine & Rehabilitation

## 2018-05-03 ENCOUNTER — Encounter

## 2018-05-03 VITALS — BP 132/84 | HR 75 | Ht 64.0 in | Wt 107.0 lb

## 2018-05-03 VITALS — BP 124/75 | HR 79 | Ht 64.0 in | Wt 107.6 lb

## 2018-05-03 DIAGNOSIS — K219 Gastro-esophageal reflux disease without esophagitis: Secondary | ICD-10-CM | POA: Diagnosis not present

## 2018-05-03 DIAGNOSIS — E785 Hyperlipidemia, unspecified: Secondary | ICD-10-CM | POA: Insufficient documentation

## 2018-05-03 DIAGNOSIS — M069 Rheumatoid arthritis, unspecified: Secondary | ICD-10-CM | POA: Diagnosis not present

## 2018-05-03 DIAGNOSIS — I69354 Hemiplegia and hemiparesis following cerebral infarction affecting left non-dominant side: Secondary | ICD-10-CM | POA: Insufficient documentation

## 2018-05-03 DIAGNOSIS — I63311 Cerebral infarction due to thrombosis of right middle cerebral artery: Secondary | ICD-10-CM

## 2018-05-03 DIAGNOSIS — R531 Weakness: Secondary | ICD-10-CM | POA: Insufficient documentation

## 2018-05-03 DIAGNOSIS — I10 Essential (primary) hypertension: Secondary | ICD-10-CM | POA: Diagnosis not present

## 2018-05-03 DIAGNOSIS — R002 Palpitations: Secondary | ICD-10-CM

## 2018-05-03 DIAGNOSIS — M7989 Other specified soft tissue disorders: Secondary | ICD-10-CM | POA: Diagnosis not present

## 2018-05-03 DIAGNOSIS — G8114 Spastic hemiplegia affecting left nondominant side: Secondary | ICD-10-CM

## 2018-05-03 DIAGNOSIS — Z7982 Long term (current) use of aspirin: Secondary | ICD-10-CM | POA: Diagnosis not present

## 2018-05-03 DIAGNOSIS — Z7902 Long term (current) use of antithrombotics/antiplatelets: Secondary | ICD-10-CM | POA: Insufficient documentation

## 2018-05-03 DIAGNOSIS — R269 Unspecified abnormalities of gait and mobility: Secondary | ICD-10-CM | POA: Insufficient documentation

## 2018-05-03 NOTE — Progress Notes (Signed)
Botox Injection for spasticity using needle EMG guidance  Dilution: 50 Units/ml Indication: Severe spasticity which interferes with ADL,mobility and/or  hygiene and is unresponsive to medication management and other conservative care Informed consent was obtained after describing risks and benefits of the procedure with the patient. This includes bleeding, bruising, infection, excessive weakness, or medication side effects. A REMS form is on file and signed. Needle: 27g 1" needle electrode Number of units per muscle Biceps50 FCR25 FCU0 FDS25 FDP50 FPL0 Brachioradialis 50 All injections were done after obtaining appropriate EMG activity and after negative drawback for blood. The patient tolerated the procedure well. Post procedure instructions were given. A followup appointment was made.   Still complaining of Left post stroke shoulder pain , not relieved by OT and Gabapentin TID rec increase to QID, Pt now seeing ortho for potential acromioplasty. We discussed this may help repair tear but there are additional pain generators related to stroke that surgery would not address.  Rec for PENS Sprint SPR Left axillary nerve stim 

## 2018-05-03 NOTE — Patient Instructions (Signed)

## 2018-05-03 NOTE — Progress Notes (Signed)
[facility]  PATIENT: Donna Rhodes DOB: 1946/06/06  REFERRING CLINICIAN: A Kirsteins / C Scott HISTORY FROM: patient and chart review REASON FOR VISIT: follow up    HISTORICAL  CHIEF COMPLAINT:  Chief Complaint  Patient presents with  . Follow-up    stroke 04/24/2017; She states she has come a long way. She says in the last few days her walking has "backed up" a little bit. She hasn't had therapy in the last few months. She had an MRI of her shoulder in March. She said that something in her head sounds like a bug zapper. She also feels her heart beating in her chest.    . Room 6    She is here alone    HISTORY OF PRESENT ILLNESS:   UPDATE (05/03/18, VRP): Since last visit, doing having more stress, with her own symptoms and family stressors. Tolerating meds. No alleviating or aggravating factors.   PRIOR HPI (08/03/17): 72 year old left-handed female here for evaluation of stroke. Patient presented to emergency room on 04/24/17 for left-sided weakness and slurred speech. Initial workup with CT scan and MRI of the brain were unremarkable. However upon discharge patient felt that her left hand was not working properly. Patient was offered opportunity to stay in the emergency room and have further evaluation. However patient declined and went back home. Unfortunately several hours later is her symptoms continue to worsen. She returned to the emergency room that evening and had another MRI scan of the brain which now showed a right range subcortical ischemic infarction. Patient was admitted for workup. She was discharged to inpatient rehabilitation.  Since that time symptoms are improving. She still has numbness, weakness, slurred speech, change in appetite, fatigue, pain. She is working with primary care, return rehabilitation clinic, physical therapy.    REVIEW OF SYSTEMS: Full 14 system review of systems performed and negative with exception of: agitation joint  swelling joint pain eye pain.   ALLERGIES: Allergies  Allergen Reactions  . Amoxicillin Other (See Comments)    Has patient had a PCN reaction causing immediate rash, facial/tongue/throat swelling, SOB or lightheadedness with hypotension: No Has patient had a PCN reaction causing severe rash involving mucus membranes or skin necrosis: No Has patient had a PCN reaction that required hospitalization: No Has patient had a PCN reaction occurring within the last 10 years: No If all of the above answers are "NO", then may proceed with Cephalosporin use.   Irritated tongue  . Avelox [Moxifloxacin Hcl In Nacl]     Tongue irritation  . Azithromycin Other (See Comments)    Caused mouth and tongue to be sore  . Codeine     "feels like I'm in a barrel"  . [place] [employer] [Nutritional Supplements]     Mouth soreness  . Food     Walnuts --mouth breaks out  . Moxifloxacin Other (See Comments)    Tongue irritation  . Naproxen     Tongue irritation  . Nsaids Other (See Comments)    Oral inflammation  . Cefepime Rash    HOME MEDICATIONS: Outpatient Medications Prior to Visit  Medication Sig Dispense Refill  . acetaminophen (TYLENOL) 500 MG tablet Take 500-1,000 mg by mouth every 6 (six) hours as needed for mild pain.    Marland Kitchen amLODipine (NORVASC) 2.5 MG tablet Take 2.5 mg by mouth daily.     Marland Kitchen atorvastatin (LIPITOR) 20 MG tablet Take 1 tablet (20 mg total) by mouth daily. 90 tablet 1  .  calcium-vitamin D (OSCAL WITH D) 250-125 MG-UNIT tablet Take 1 tablet by mouth daily.    . clopidogrel (PLAVIX) 75 MG tablet TAKE 1 TABLET BY MOUTH ONCE DAILY 90 tablet 1  . folic acid (FOLVITE) 1 MG tablet Take 1 mg by mouth daily.    Marland Kitchen levothyroxine (SYNTHROID, LEVOTHROID) 50 MCG tablet TAKE 1 TABLET BY MOUTH ONCE DAILY BEFORE BREAKFAST 90 tablet 1  . methotrexate 50 MG/2ML injection Inject 0.59mL under the skin once a weekly on Wednesday    . Multiple Vitamin (MULTIVITAMIN WITH MINERALS) TABS  tablet Take 1 tablet by mouth daily.    . pantoprazole (PROTONIX) 20 MG tablet Take 1 tablet (20 mg total) by mouth daily. 30 tablet 3  . acyclovir (ZOVIRAX) 200 MG capsule TAKE ONE CAPSULE BY MOUTH TWICE DAILY (Patient not taking: Reported on 05/03/2018) 180 capsule 3  . citalopram (CELEXA) 10 MG tablet TAKE 1 TABLET BY MOUTH ONCE DAILY (Patient not taking: Reported on 05/03/2018) 30 tablet 2  . doxycycline (VIBRA-TABS) 100 MG tablet Take 1 tablet (100 mg total) by mouth 2 (two) times daily. 14 tablet 0  . gabapentin (NEURONTIN) 100 MG capsule Take 1 capsule (100 mg total) by mouth 4 (four) times daily. (Patient not taking: Reported on 05/03/2018) 120 capsule 1   No facility-administered medications prior to visit.     PAST MEDICAL HISTORY: Past Medical History:  Diagnosis Date  . Atrophic vaginitis   . Dysplasia of vagina    vin 1 - vulva and vaginal cuff  . GERD (gastroesophageal reflux disease)   . HCV infection   . HSV (herpes simplex virus) infection   . Hypercholesterolemia   . Hypertension   . Hypothyroidism   . Left-sided weakness    S/P CVA 4/18.  PT 2x/wk.  . Lung nodule    stable  . Menopausal state   . Osteoarthritis    hand involvement, cervical disc disease  . Osteoporosis   . Pelvic pain in female   . Rheumatoid arthritis ([place])   . Stroke (cerebrum) ([place]) 04/24/2017  . Thyroid nodule    pt unaware of this   . Vaginal Pap smear, abnormal    lgsil    PAST SURGICAL HISTORY: Past Surgical History:  Procedure Laterality Date  . ADENOIDECTOMY    . APPENDECTOMY  1965  . [place]   left  . CATARACT EXTRACTION W/PHACO Right 10/27/2017   Procedure: CATARACT EXTRACTION PHACO AND INTRAOCULAR LENS PLACEMENT ([place]) RIGHT;  Surgeon: Leandrew Koyanagi, MD;  Location: [place];  Service: Ophthalmology;  Laterality: Right;  requests early (8-8:30 arrival)  . CATARACT EXTRACTION W/PHACO Left 11/10/2017   Procedure: CATARACT EXTRACTION PHACO AND  INTRAOCULAR LENS PLACEMENT ([place]) LEFT;  Surgeon: Leandrew Koyanagi, MD;  Location: [facility];  Service: Ophthalmology;  Laterality: Left;  . CHOLECYSTECTOMY    . [place]  . laser vein surgery    . LOOP RECORDER INSERTION N/A 09/24/2017   Procedure: LOOP RECORDER INSERTION;  Surgeon: Evans Lance, MD;  Location: [facility];  Service: Cardiovascular;  Laterality: N/A;  . MANDIBLE RECONSTRUCTION  1992  . TOENAIL EXCISION  1998   several  . TONSILLECTOMY    . TUBAL LIGATION  1977  . VAGINAL HYSTERECTOMY  1983   als0- anterior/posterior colporrhaphy  . VAGINAL WOUND CLOSURE / REPAIR  1999, 2000  . VAGINAL WOUND CLOSURE / REPAIR      FAMILY HISTORY: Family History  Problem Relation Age of Onset  .  Hypertension Father   . Dementia Father   . Osteoarthritis Mother   . Arthritis/Rheumatoid Sister   . Breast cancer Unknown        paternal side  . Breast cancer Paternal Aunt   . Breast cancer Cousin        maternal  . Uterine cancer Maternal Grandmother   . Ovarian cancer Neg Hx   . Heart disease Neg Hx   . Diabetes Neg Hx   . Colon cancer Neg Hx     SOCIAL HISTORY:  Social History   Socioeconomic History  . Marital status: Divorced    Spouse name: Not on file  . Number of children: 3  . Years of education: 53  . Highest education level: Not on file  Occupational History    Comment: retired  Scientific laboratory technician  . Financial resource strain: Not on file  . Food insecurity:    Worry: Not on file    Inability: Not on file  . Transportation needs:    Medical: Not on file    Non-medical: Not on file  Tobacco Use  . Smoking status: Former Smoker    Last attempt to quit: 12/29/1979    Years since quitting: 38.3  . Smokeless tobacco: Never Used  Substance and Sexual Activity  . Alcohol use: No    Alcohol/week: 0.0 oz  . Drug use: No  . Sexual activity: Not Currently    Birth control/protection: Surgical  Lifestyle  . Physical activity:     Days per week: Not on file    Minutes per session: Not on file  . Stress: Not on file  Relationships  . Social connections:    Talks on phone: Not on file    Gets together: Not on file    Attends religious service: Not on file    Active member of club or organization: Not on file    Attends meetings of clubs or organizations: Not on file    Relationship status: Not on file  . Intimate partner violence:    Fear of current or ex partner: Not on file    Emotionally abused: Not on file    Physically abused: Not on file    Forced sexual activity: Not on file  Other Topics Concern  . Not on file  Social History Narrative   Her mother and her daughter live with her   Caffeine- 1 cup coffee     PHYSICAL EXAM  GENERAL EXAM/CONSTITUTIONAL: Vitals:  Vitals:   05/03/18 1023  BP: 132/84  Pulse: 75  Weight: 107 lb (48.5 kg)  Height: 5\' 4"  (1.626 m)   Body mass index is 18.37 kg/m. No exam data present  Patient is in no distress; well developed, nourished and groomed; neck is supple  CARDIOVASCULAR:  Examination of carotid arteries is normal; no carotid bruits  Regular rate and rhythm, no murmurs  Examination of peripheral vascular system by observation and palpation is normal  EYES:  Ophthalmoscopic exam of optic discs and posterior segments is normal; no papilledema or hemorrhages  MUSCULOSKELETAL:  Gait, strength, tone, movements noted in Neurologic exam below  NEUROLOGIC: MENTAL STATUS:  No flowsheet data found.  awake, alert, oriented to person, place and time  recent and remote memory intact  normal attention and concentration  language fluent, comprehension intact, naming intact,   fund of knowledge appropriate  CRANIAL NERVE:   2nd - no papilledema on fundoscopic exam  2nd, 3rd, 4th, 6th - pupils equal and reactive to  light, visual fields full to confrontation, extraocular muscles intact, no nystagmus  5th - facial sensation symmetric  7th -  facial strength symmetric  8th - hearing intact  9th - palate elevates symmetrically, uvula midline  11th - shoulder shrug symmetric  12th - tongue protrusion midline  MOTOR:   normal bulk, full strength in the RUE, RLE  LUE 2-3 PROX; 1-2 DISTAL; INCR TONE DISTALLY IN LUE  LLE 4+  SENSORY:   normal and symmetric to light touch, temperature, vibration  SLIGHT DECR IN LEFT HAND FINGERTIPS TO PP  COORDINATION:   finger-nose-finger, fine finger movements --> SLOW ON LEFT DUE TO WEAKNESS  SLOWER FOOT TAP ON LEFT  REFLEXES:   deep tendon reflexes present --> SLIGHTLY BRISK IN LEFT ARM  GAIT/STATION:   LEFT HEMIPARETIC GAIT; UNSTEADY; ALMOST FALLS WHEN STANDING UP    DIAGNOSTIC DATA (LABS, IMAGING, TESTING) - I reviewed patient records, labs, notes, testing and imaging myself where available.  Lab Results  Component Value Date   WBC 6.9 08/16/2017   HGB 12.4 08/16/2017   HCT 37.9 08/16/2017   MCV 96.1 08/16/2017   PLT 252.0 08/16/2017      Component Value Date/Time   NA 142 12/13/2017 0924   K 4.5 12/13/2017 0924   CL 105 12/13/2017 0924   CO2 30 12/13/2017 0924   GLUCOSE 83 12/13/2017 0924   BUN 16 12/13/2017 0924   CREATININE 0.75 12/13/2017 0924   CALCIUM 9.5 12/13/2017 0924   PROT 7.1 12/13/2017 0924   ALBUMIN 4.2 12/13/2017 0924   AST 25 12/13/2017 0924   ALT 23 12/13/2017 0924   ALKPHOS 75 12/13/2017 0924   BILITOT 0.6 12/13/2017 0924   GFRNONAA >60 05/18/2017 0442   GFRAA >60 05/18/2017 0442   Lab Results  Component Value Date   CHOL 149 12/13/2017   HDL 62.40 12/13/2017   LDLCALC 70 12/13/2017   LDLDIRECT 128.0 05/25/2013   TRIG 87.0 12/13/2017   CHOLHDL 2 12/13/2017   Lab Results  Component Value Date   HGBA1C 5.7 (H) 04/25/2017   Lab Results  Component Value Date   VITAMINB12 348 08/16/2017   Lab Results  Component Value Date   TSH 3.09 12/13/2017    04/24/17 MRI brain / MRA head (8:31am) [I reviewed images myself and agree  with interpretation. -VRP]  - No acute intracranial findings. Mild subcortical and periventricular T2 and FLAIR hyperintensities, likely chronic microvascular ischemic change. - Cervical spondylosis with central protrusion at C4-5, unknown degree of canal stenosis.  04/24/17 MRI brain / MRA head (5:09pm) [I reviewed images myself and agree with interpretation. Stroke is 2cm in size. -VRP]  - Acute infarction in the right basal ganglia/ posterior limb internal capsule. No evidence of hemorrhage or mass effect. - Negative intracranial MR angiography of the large and medium size vessels. No occlusion or correctable proximal stenosis. Some atherosclerotic irregularity in the carotid siphon regions without stenosis. Anatomic variations of hypoplastic A1 segment on the left, azygos anterior cerebral artery and fetal origin left PCA.  04/24/17 MRA neck - Normal MR angiography of the neck vessels.  04/26/17 TTE - Left ventricle: Wall thickness was increased in a pattern of mild   LVH. Systolic function was normal. The estimated ejection   fraction was in the range of 50% to 55%. Doppler parameters are   consistent with abnormal left ventricular relaxation (grade 1   diastolic dysfunction). - Aortic valve: Valve area (Vmax): 2.07 cm^2. - Mitral valve: Valve area by pressure half-time:  2.08 cm^2.  04/28/17 BLE u/s - No evidence of deep vein thrombosis involving the visualized veins of the right lower extremity. - No evidence of deep vein thrombosis involving the visualized veins of the left lower extremity. - No evidence of Baker&'s cyst on the right or left.  03/16/18 MRI left shoulder  1. Complete tear of the supraspinatus tendon with 2.6 cm of retraction. 2. Mild tendinosis of the infraspinatus tendon without a tear. 3. Mild atrophy of the supraspinatus muscle. 4. Moderate tendinosis of the intra-articular portion of the long head of the biceps tendon.     ASSESSMENT AND PLAN  72 y.o. year old  female here with right brain subcortical ischemic infarction, with resultant left-sided weakness. She also had palpitations prior to onset of symptoms and another family member states that patient may have had atrial fibrillation in the past.  Dx:  1. Cerebrovascular accident (CVA) due to thrombosis of right middle cerebral artery (HCC)   2. Palpitations      PLAN:  RIGHT BRAIN SUBCORTICAL STROKE (2cm stroke; larger than typical lacunar infarct; need to complete cardio-embolic workup) - continue plavix - continue atorvastatin - continue amlodipine  PALPITATIONS (? Atrial fibrillation per family account) - follow up cardiac monitor  AGITATION / DEPRESSION - consider SSRI or psychology evaluation  Return if symptoms worsen or fail to improve, for return to PCP.    Penni Bombard, MD 06/29/4192, 79:02 AM Certified in Neurology, Neurophysiology and Neuroimaging  [facility] [address] [place], [place] [address] [phone]

## 2018-05-04 LAB — CUP PACEART REMOTE DEVICE CHECK
Date Time Interrogation Session: 20190402173748
MDC IDC PG IMPLANT DT: 20180928

## 2018-05-06 ENCOUNTER — Ambulatory Visit: Payer: PPO

## 2018-05-06 ENCOUNTER — Encounter: Payer: Self-pay | Admitting: Physical Medicine & Rehabilitation

## 2018-05-06 ENCOUNTER — Other Ambulatory Visit: Payer: Self-pay | Admitting: Internal Medicine

## 2018-05-06 ENCOUNTER — Ambulatory Visit: Payer: PPO | Admitting: Physical Medicine & Rehabilitation

## 2018-05-13 DIAGNOSIS — M75122 Complete rotator cuff tear or rupture of left shoulder, not specified as traumatic: Secondary | ICD-10-CM | POA: Diagnosis not present

## 2018-05-13 DIAGNOSIS — M7582 Other shoulder lesions, left shoulder: Secondary | ICD-10-CM | POA: Diagnosis not present

## 2018-05-13 DIAGNOSIS — I6329 Cerebral infarction due to unspecified occlusion or stenosis of other precerebral arteries: Secondary | ICD-10-CM | POA: Diagnosis not present

## 2018-05-17 ENCOUNTER — Other Ambulatory Visit (INDEPENDENT_AMBULATORY_CARE_PROVIDER_SITE_OTHER): Payer: PPO

## 2018-05-17 ENCOUNTER — Telehealth: Payer: Self-pay | Admitting: *Deleted

## 2018-05-17 DIAGNOSIS — E7800 Pure hypercholesterolemia, unspecified: Secondary | ICD-10-CM | POA: Diagnosis not present

## 2018-05-17 DIAGNOSIS — M0609 Rheumatoid arthritis without rheumatoid factor, multiple sites: Secondary | ICD-10-CM | POA: Diagnosis not present

## 2018-05-17 DIAGNOSIS — Z79899 Other long term (current) drug therapy: Secondary | ICD-10-CM | POA: Diagnosis not present

## 2018-05-17 DIAGNOSIS — I10 Essential (primary) hypertension: Secondary | ICD-10-CM | POA: Diagnosis not present

## 2018-05-17 LAB — LIPID PANEL
Cholesterol: 141 mg/dL (ref 0–200)
HDL: 63.9 mg/dL (ref >39.00)
LDL Cholesterol: 64 mg/dL (ref 0–99)
NonHDL: 76.95
Total CHOL/HDL Ratio: 2
Triglycerides: 64 mg/dL (ref 0.0–149.0)
VLDL: 12.8 mg/dL (ref 0.0–40.0)

## 2018-05-17 LAB — HEPATIC FUNCTION PANEL
ALT: 28 U/L (ref 0–35)
AST: 27 U/L (ref 0–37)
Albumin: 4.3 g/dL (ref 3.5–5.2)
Alkaline Phosphatase: 80 U/L (ref 39–117)
BILIRUBIN DIRECT: 0.1 mg/dL (ref 0.0–0.3)
BILIRUBIN TOTAL: 0.6 mg/dL (ref 0.2–1.2)
Total Protein: 7.4 g/dL (ref 6.0–8.3)

## 2018-05-17 LAB — BASIC METABOLIC PANEL
BUN: 16 mg/dL (ref 6–23)
CO2: 29 mEq/L (ref 19–32)
CREATININE: 0.67 mg/dL (ref 0.40–1.20)
Calcium: 9.6 mg/dL (ref 8.4–10.5)
Chloride: 106 mEq/L (ref 96–112)
GFR: 91.82 mL/min (ref >60.00)
GLUCOSE: 89 mg/dL (ref 70–99)
Potassium: 3.9 mEq/L (ref 3.5–5.1)
SODIUM: 142 meq/L (ref 135–145)

## 2018-05-17 NOTE — Telephone Encounter (Signed)
FYI: Pt came in for labs this morning & told me that she had just left Dr. Jefm Bryant office & had several labs drawn there too. Sh couldn't recall what they were checking & I could not see them in care everywhere. Just wanted you to know in case there were any duplicates.

## 2018-05-18 ENCOUNTER — Encounter: Payer: Self-pay | Admitting: Internal Medicine

## 2018-05-18 ENCOUNTER — Ambulatory Visit (INDEPENDENT_AMBULATORY_CARE_PROVIDER_SITE_OTHER): Payer: PPO | Admitting: Internal Medicine

## 2018-05-18 DIAGNOSIS — F320 Major depressive disorder, single episode, mild: Secondary | ICD-10-CM | POA: Diagnosis not present

## 2018-05-18 DIAGNOSIS — M0600 Rheumatoid arthritis without rheumatoid factor, unspecified site: Secondary | ICD-10-CM | POA: Diagnosis not present

## 2018-05-18 DIAGNOSIS — I10 Essential (primary) hypertension: Secondary | ICD-10-CM | POA: Diagnosis not present

## 2018-05-18 DIAGNOSIS — E7800 Pure hypercholesterolemia, unspecified: Secondary | ICD-10-CM

## 2018-05-18 DIAGNOSIS — F32A Depression, unspecified: Secondary | ICD-10-CM

## 2018-05-18 DIAGNOSIS — I639 Cerebral infarction, unspecified: Secondary | ICD-10-CM | POA: Diagnosis not present

## 2018-05-18 DIAGNOSIS — M1990 Unspecified osteoarthritis, unspecified site: Secondary | ICD-10-CM | POA: Diagnosis not present

## 2018-05-18 DIAGNOSIS — K219 Gastro-esophageal reflux disease without esophagitis: Secondary | ICD-10-CM

## 2018-05-18 DIAGNOSIS — E039 Hypothyroidism, unspecified: Secondary | ICD-10-CM

## 2018-05-18 DIAGNOSIS — F4323 Adjustment disorder with mixed anxiety and depressed mood: Secondary | ICD-10-CM | POA: Diagnosis not present

## 2018-05-18 DIAGNOSIS — G8114 Spastic hemiplegia affecting left nondominant side: Secondary | ICD-10-CM | POA: Diagnosis not present

## 2018-05-18 MED ORDER — CLOPIDOGREL BISULFATE 75 MG PO TABS: 75.0000 mg | ORAL_TABLET | Freq: Every day | ORAL | 1 refills | Status: DC

## 2018-05-18 MED ORDER — PANTOPRAZOLE SODIUM 20 MG PO TBEC
20.0000 mg | DELAYED_RELEASE_TABLET | Freq: Every day | ORAL | 1 refills | Status: DC
Start: 2018-05-18 — End: 2020-04-14

## 2018-05-18 NOTE — Progress Notes (Signed)
Patient ID: Donna Rhodes, female   DOB: 1946/02/24, 72 y.o.   MRN: [id number]   Subjective:    Patient ID: Donna Rhodes, female    DOB: 1946/05/03, 72 y.o.   MRN: [id number]  HPI  Patient here for a scheduled follow up.  Recent CVA.  Resulting left hemiparesis in left upper extremity.  Has been doing therapy. Frustrated because she cannot do the things she was previously able to do.  Has always been very active.  Increased stress related to this and related to family situation.  Discussed with her today.  She feels she is handling things relatively well.  discussed counseling or psychiatry referral.  She declines.  Wants to follow. Does not feel she needs any further intervention.  She is on citalopram.  Not taking.  Has just restarted. Plans to take regularly now.   No chest pain.  States at times she will feel her heart beat hard.  No pain.  No increased heart rate or palpitations. She feels is related to stress.  She just saw cardiology 02/2018.  Has ILR.  No afib.  Has some atrial and ventricular ectopy.  Felt no further cardiac w/up warranted.  Overall she feels things are stable.  Eating.  Some decreased appetite.  No vomiting.  No abdominal pain.  Bowels moving.  Seeing Dr Roland Rack for rotator cuff tear.  Pain has essentially resolved.  Still limited rom.  Received botox 05/03/18.  Overdue mammogram.  Discussed with her.  She declines.     Past Medical History:  Diagnosis Date  . Atrophic vaginitis   . Dysplasia of vagina    vin 1 - vulva and vaginal cuff  . GERD (gastroesophageal reflux disease)   . HCV infection   . HSV (herpes simplex virus) infection   . Hypercholesterolemia   . Hypertension   . Hypothyroidism   . Left-sided weakness    S/P CVA 4/18.  PT 2x/wk.  . Lung nodule    stable  . Menopausal state   . Osteoarthritis    hand involvement, cervical disc disease  . Osteoporosis   . Pelvic pain in female   . Rheumatoid arthritis ([employer])   . Stroke (cerebrum) ([place])  04/24/2017  . Thyroid nodule    pt unaware of this   . Vaginal Pap smear, abnormal    lgsil   Past Surgical History:  Procedure Laterality Date  . ADENOIDECTOMY    . APPENDECTOMY  1965  . [place]   left  . CATARACT EXTRACTION W/PHACO Right 10/27/2017   Procedure: CATARACT EXTRACTION PHACO AND INTRAOCULAR LENS PLACEMENT ([place]) RIGHT;  Surgeon: Leandrew Koyanagi, MD;  Location: [facility];  Service: Ophthalmology;  Laterality: Right;  requests early (8-8:30 arrival)  . CATARACT EXTRACTION W/PHACO Left 11/10/2017   Procedure: CATARACT EXTRACTION PHACO AND INTRAOCULAR LENS PLACEMENT ([place]) LEFT;  Surgeon: Leandrew Koyanagi, MD;  Location: [facility];  Service: Ophthalmology;  Laterality: Left;  . CHOLECYSTECTOMY    . [place]  . laser vein surgery    . LOOP RECORDER INSERTION N/A 09/24/2017   Procedure: LOOP RECORDER INSERTION;  Surgeon: Evans Lance, MD;  Location: [facility];  Service: Cardiovascular;  Laterality: N/A;  . MANDIBLE RECONSTRUCTION  1992  . TOENAIL EXCISION  1998   several  . TONSILLECTOMY    . TUBAL LIGATION  1977  . VAGINAL HYSTERECTOMY  1983   als0- anterior/posterior colporrhaphy  . VAGINAL WOUND CLOSURE / REPAIR  1999, 2000  . VAGINAL WOUND CLOSURE / REPAIR     Family History  Problem Relation Age of Onset  . Hypertension Father   . Dementia Father   . Osteoarthritis Mother   . Arthritis/Rheumatoid Sister   . Breast cancer Unknown        paternal side  . Breast cancer Paternal Aunt   . Breast cancer Cousin        maternal  . Uterine cancer Maternal Grandmother   . Ovarian cancer Neg Hx   . Heart disease Neg Hx   . Diabetes Neg Hx   . Colon cancer Neg Hx    Social History   Socioeconomic History  . Marital status: Divorced    Spouse name: Not on file  . Number of children: 3  . Years of education: 49  . Highest education level: Not on file  Occupational History    Comment: retired  Photographer  . Financial resource strain: Not on file  . Food insecurity:    Worry: Not on file    Inability: Not on file  . Transportation needs:    Medical: Not on file    Non-medical: Not on file  Tobacco Use  . Smoking status: Former Smoker    Last attempt to quit: 12/29/1979    Years since quitting: 38.4  . Smokeless tobacco: Never Used  Substance and Sexual Activity  . Alcohol use: No    Alcohol/week: 0.0 oz  . Drug use: No  . Sexual activity: Not Currently    Birth control/protection: Surgical  Lifestyle  . Physical activity:    Days per week: Not on file    Minutes per session: Not on file  . Stress: Not on file  Relationships  . Social connections:    Talks on phone: Not on file    Gets together: Not on file    Attends religious service: Not on file    Active member of club or organization: Not on file    Attends meetings of clubs or organizations: Not on file    Relationship status: Not on file  Other Topics Concern  . Not on file  Social History Narrative   Her mother and her daughter live with her   Caffeine- 1 cup coffee    Outpatient Encounter Medications as of 05/18/2018  Medication Sig  . acetaminophen (TYLENOL) 500 MG tablet Take 500-1,000 mg by mouth every 6 (six) hours as needed for mild pain.  Marland Kitchen acyclovir (ZOVIRAX) 200 MG capsule TAKE ONE CAPSULE BY MOUTH TWICE DAILY  . amLODipine (NORVASC) 2.5 MG tablet Take 2.5 mg by mouth daily.   Marland Kitchen atorvastatin (LIPITOR) 20 MG tablet Take 1 tablet (20 mg total) by mouth daily.  . calcium-vitamin D (OSCAL WITH D) 250-125 MG-UNIT tablet Take 1 tablet by mouth daily.  . citalopram (CELEXA) 10 MG tablet TAKE 1 TABLET BY MOUTH ONCE DAILY  . clopidogrel (PLAVIX) 75 MG tablet Take 1 tablet (75 mg total) by mouth daily.  . folic acid (FOLVITE) 1 MG tablet Take 1 mg by mouth daily.  Marland Kitchen levothyroxine (SYNTHROID, LEVOTHROID) 50 MCG tablet TAKE 1 TABLET BY MOUTH ONCE DAILY BEFORE BREAKFAST  . methotrexate 50 MG/2ML injection Inject  0.39mL under the skin once a weekly on Wednesday  . Multiple Vitamin (MULTIVITAMIN WITH MINERALS) TABS tablet Take 1 tablet by mouth daily.  . pantoprazole (PROTONIX) 20 MG tablet Take 1 tablet (20 mg total) by mouth daily.  . [DISCONTINUED] clopidogrel (PLAVIX) 75  MG tablet TAKE 1 TABLET BY MOUTH ONCE DAILY  . [DISCONTINUED] pantoprazole (PROTONIX) 20 MG tablet Take 1 tablet (20 mg total) by mouth daily.   No facility-administered encounter medications on file as of 05/18/2018.     Review of Systems  Constitutional:       Some decreased appetite.  Weight stable over last few checks.    HENT: Negative for congestion and sinus pressure.   Respiratory: Negative for cough, chest tightness and shortness of breath.   Cardiovascular: Negative for chest pain, palpitations and leg swelling.  Gastrointestinal: Negative for abdominal pain, diarrhea and nausea.  Genitourinary: Negative for difficulty urinating and dysuria.  Musculoskeletal: Negative for joint swelling and myalgias.       Shoulder pain better.    Skin: Negative for color change and rash.  Neurological: Negative for dizziness, light-headedness and headaches.  Psychiatric/Behavioral: Negative for agitation.       Increased stress as outlined.         Objective:    Physical Exam  Constitutional: She appears well-developed and well-nourished. No distress.  HENT:  Nose: Nose normal.  Mouth/Throat: Oropharynx is clear and moist.  Neck: Neck supple. No thyromegaly present.  Cardiovascular: Normal rate and regular rhythm.  Pulmonary/Chest: Breath sounds normal. No respiratory distress. She has no wheezes.  Abdominal: Soft. Bowel sounds are normal. There is no tenderness.  Musculoskeletal: She exhibits no edema or tenderness.  Lymphadenopathy:    She has no cervical adenopathy.  Skin: No rash noted. No erythema.  Psychiatric: Her behavior is normal.  Some crying today with discussion.  Declines counseling.     BP 122/76 (BP  Location: Right Arm, Patient Position: Sitting, Cuff Size: Normal)   Pulse 66   Temp 98.4 F (36.9 C) (Oral)   Resp 16   Wt 107 lb 12.8 oz (48.9 kg)   LMP 12/29/1981   SpO2 98%   BMI 18.50 kg/m  Wt Readings from Last 3 Encounters:  05/18/18 107 lb 12.8 oz (48.9 kg)  05/03/18 107 lb 9.6 oz (48.8 kg)  05/03/18 107 lb (48.5 kg)     Lab Results  Component Value Date   WBC 6.9 08/16/2017   HGB 12.4 08/16/2017   HCT 37.9 08/16/2017   PLT 252.0 08/16/2017   GLUCOSE 89 05/17/2018   CHOL 141 05/17/2018   TRIG 64.0 05/17/2018   HDL 63.90 05/17/2018   LDLDIRECT 128.0 05/25/2013   LDLCALC 64 05/17/2018   ALT 28 05/17/2018   AST 27 05/17/2018   NA 142 05/17/2018   K 3.9 05/17/2018   CL 106 05/17/2018   CREATININE 0.67 05/17/2018   BUN 16 05/17/2018   CO2 29 05/17/2018   TSH 3.09 12/13/2017   HGBA1C 5.7 (H) 04/25/2017    Mr Shoulder Left Wo Contrast  Result Date: 03/16/2018 CLINICAL DATA:  Fall in April 2018 and hit shoulder on corner of furniture. Continued pain with limited ROM and weakness. EXAM: MRI OF THE LEFT SHOULDER WITHOUT CONTRAST TECHNIQUE: Multiplanar, multisequence MR imaging of the shoulder was performed. No intravenous contrast was administered. COMPARISON:  None. FINDINGS: Rotator cuff: Complete tear of the supraspinatus tendon with 2.6 cm of retraction. Mild tendinosis of the infraspinatus tendon without a tear. Teres minor tendon is intact. Subscapularis tendon is intact. Muscles: Mild atrophy of the supraspinatus muscle. Remainder of the rotator cuff muscles are normal. Deltoid musculature is normal. Biceps long head: Moderate tendinosis of the intra-articular portion of the long head of the biceps tendon. Acromioclavicular Joint: Mild  arthropathy of the acromioclavicular joint. Type II acromion. Small amount of subacromial/subdeltoid bursal fluid. Glenohumeral Joint: No joint effusion. Partial-thickness cartilage loss of the glenohumeral joint. Labrum: Limited  evaluation secondary lack of intra-articular fluid. Posterior labral tear. Bones:  No acute osseous abnormality.  No aggressive osseous lesion. Other: No fluid collection or hematoma. IMPRESSION: 1. Complete tear of the supraspinatus tendon with 2.6 cm of retraction. 2. Mild tendinosis of the infraspinatus tendon without a tear. 3. Mild atrophy of the supraspinatus muscle. 4. Moderate tendinosis of the intra-articular portion of the long head of the biceps tendon. Electronically Signed   By: Kathreen Devoid   On: 03/16/2018 15:33       Assessment & Plan:   Problem List Items Addressed This Visit    Adjustment disorder with mixed anxiety and depressed mood    Discussed with her today regarding increased stress, etc.  Declines referral to counselor or psychiatry.  No suicidal ideations.  Just restarted citalopram.  Will continue daily.  Follow.        Benign essential HTN    Blood pressure under good control.  Continue same medication regimen.  Follow pressures.  Follow metabolic panel.        Relevant Orders   CBC with Differential/Platelet   Basic metabolic panel   CVA (cerebral vascular accident) ([place])    Resulting left hemiparesis left upper extremity.  Therapy.  Follow.  On plavix.        GERD (gastroesophageal reflux disease)    Controlled.        Relevant Medications   pantoprazole (PROTONIX) 20 MG tablet   Hypercholesterolemia    On lipitor.  Follow lipid panel and liver function tests.        Relevant Orders   Hepatic function panel   Lipid panel   Hypothyroidism, unspecified    On thyroid replacement.  Follow tsh.        Inflammatory arthritis    Followed by Dr Jefm Bryant.  On MTX.        Mild depression ([place])    Will start taking citalopram daily as outlined.  Declines counseling or psych referral.        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 ([place])    Followed by Dr Jefm Bryant.  On MTX.        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  Therapy.  On plavix.            Einar Pheasant, MD

## 2018-05-21 ENCOUNTER — Encounter: Payer: Self-pay | Admitting: Internal Medicine

## 2018-05-21 NOTE — Assessment & Plan Note (Signed)
Resulting left hemiparesis left upper extremity.  Therapy.  Follow.  On plavix.

## 2018-05-21 NOTE — Assessment & Plan Note (Signed)
Blood pressure under good control.  Continue same medication regimen.  Follow pressures.  Follow metabolic panel.   

## 2018-05-21 NOTE — Assessment & Plan Note (Signed)
On lipitor.  Follow lipid panel and liver function tests.   

## 2018-05-21 NOTE — Assessment & Plan Note (Signed)
On thyroid replacement.  Follow tsh.  

## 2018-05-21 NOTE — Assessment & Plan Note (Signed)
Discussed with her today regarding increased stress, etc.  Declines referral to counselor or psychiatry.  No suicidal ideations.  Just restarted citalopram.  Will continue daily.  Follow.

## 2018-05-21 NOTE — Assessment & Plan Note (Signed)
Followed by Dr Kernodle.  On MTX.   

## 2018-05-21 NOTE — Assessment & Plan Note (Signed)
Will start taking citalopram daily as outlined.  Declines counseling or psych referral.

## 2018-05-21 NOTE — Assessment & Plan Note (Signed)
Seeing neurology.  Therapy.  On plavix.

## 2018-05-21 NOTE — Assessment & Plan Note (Signed)
Controlled.  

## 2018-05-24 LAB — CUP PACEART REMOTE DEVICE CHECK
Date Time Interrogation Session: 20190505181057
MDC IDC PG IMPLANT DT: 20180928

## 2018-05-30 ENCOUNTER — Other Ambulatory Visit: Payer: PPO

## 2018-06-03 ENCOUNTER — Ambulatory Visit (INDEPENDENT_AMBULATORY_CARE_PROVIDER_SITE_OTHER): Payer: PPO | Admitting: *Deleted

## 2018-06-03 DIAGNOSIS — I639 Cerebral infarction, unspecified: Secondary | ICD-10-CM

## 2018-06-06 NOTE — Progress Notes (Signed)
Carelink Summary Report / Loop Recorder 

## 2018-06-14 ENCOUNTER — Ambulatory Visit: Payer: PPO | Admitting: Physical Medicine & Rehabilitation

## 2018-06-16 ENCOUNTER — Ambulatory Visit: Payer: PPO | Admitting: Physical Medicine & Rehabilitation

## 2018-06-16 ENCOUNTER — Ambulatory Visit: Payer: PPO

## 2018-06-23 ENCOUNTER — Telehealth: Payer: Self-pay

## 2018-06-23 NOTE — Telephone Encounter (Signed)
FYI

## 2018-06-23 NOTE — Telephone Encounter (Signed)
Reviewed [facility] Bone density.  Bone density is normal.  Sorry for the inconvenience.

## 2018-06-23 NOTE — Telephone Encounter (Signed)
Copied from [place] [id number]. Topic: Quick Communication - See Telephone Encounter >> Jun 23, 2018 11:47 AM Antonieta Iba C wrote: CRM for notification. See Telephone encounter for: 06/23/18.  Pt says that she went to have her bone density completed and was told that she had already had one at [facility] in February so pt said that she didn't have it done. Pt would just like to make provider aware.

## 2018-06-28 ENCOUNTER — Encounter: Payer: PPO | Attending: Physical Medicine & Rehabilitation

## 2018-06-28 ENCOUNTER — Ambulatory Visit: Payer: PPO | Admitting: Physical Medicine & Rehabilitation

## 2018-06-28 ENCOUNTER — Encounter: Payer: Self-pay | Admitting: Physical Medicine & Rehabilitation

## 2018-06-28 VITALS — BP 131/78 | HR 66 | Resp 14 | Wt 105.0 lb

## 2018-06-28 DIAGNOSIS — M7989 Other specified soft tissue disorders: Secondary | ICD-10-CM | POA: Diagnosis not present

## 2018-06-28 DIAGNOSIS — R269 Unspecified abnormalities of gait and mobility: Secondary | ICD-10-CM | POA: Diagnosis not present

## 2018-06-28 DIAGNOSIS — M7542 Impingement syndrome of left shoulder: Secondary | ICD-10-CM

## 2018-06-28 DIAGNOSIS — R531 Weakness: Secondary | ICD-10-CM | POA: Diagnosis not present

## 2018-06-28 DIAGNOSIS — Z7982 Long term (current) use of aspirin: Secondary | ICD-10-CM | POA: Insufficient documentation

## 2018-06-28 DIAGNOSIS — G8114 Spastic hemiplegia affecting left nondominant side: Secondary | ICD-10-CM

## 2018-06-28 DIAGNOSIS — M069 Rheumatoid arthritis, unspecified: Secondary | ICD-10-CM | POA: Diagnosis not present

## 2018-06-28 DIAGNOSIS — I10 Essential (primary) hypertension: Secondary | ICD-10-CM | POA: Insufficient documentation

## 2018-06-28 DIAGNOSIS — M792 Neuralgia and neuritis, unspecified: Secondary | ICD-10-CM | POA: Diagnosis not present

## 2018-06-28 DIAGNOSIS — Z7902 Long term (current) use of antithrombotics/antiplatelets: Secondary | ICD-10-CM | POA: Insufficient documentation

## 2018-06-28 DIAGNOSIS — K219 Gastro-esophageal reflux disease without esophagitis: Secondary | ICD-10-CM | POA: Diagnosis not present

## 2018-06-28 DIAGNOSIS — I69354 Hemiplegia and hemiparesis following cerebral infarction affecting left non-dominant side: Secondary | ICD-10-CM | POA: Insufficient documentation

## 2018-06-28 DIAGNOSIS — E785 Hyperlipidemia, unspecified: Secondary | ICD-10-CM | POA: Diagnosis not present

## 2018-06-28 NOTE — Patient Instructions (Signed)
Repeat botox in 6 wks same dose

## 2018-06-28 NOTE — Progress Notes (Signed)
Subjective:    Patient ID: Donna Rhodes, female    DOB: 1946/12/27, 72 y.o.   MRN: [id number]  HPI Right basal ganglia, Right internal capsule infarct, with chronic left hemiparesis affecting the upper greater than lower limb.  Last visit for Botox Biceps50 FCR25 FCU0 FDS25 FDP50 FPL0 Brachioradialis 50  Left shoulder pain, reviewed MRI of the shoulder from March 2019.  Has a full-thickness tear of the supraspinatus tendon.  In addition has evidence of biceps tendinosis No falls or new trauma to the shoulder.  She continues to have pain mainly when she is trying to use the arm. Pain Inventory Average Pain 2 Pain Right Now 2 My pain is aching  In the last 24 hours, has pain interfered with the following? General activity 10 Relation with others 0 Enjoyment of life 10 What TIME of day is your pain at its worst? varies Sleep (in general) Fair  Pain is worse with: some activites Pain improves with: therapy/exercise and medication Relief from Meds: 0  Mobility walk without assistance how many minutes can you walk? 8 ability to climb steps?  yes do you drive?  yes  Function retired  Neuro/Psych numbness tingling trouble walking  Prior Studies Any changes since last visit?  no MRI OF THE LEFT SHOULDER WITHOUT CONTRAST   TECHNIQUE: Multiplanar, multisequence MR imaging of the shoulder was performed. No intravenous contrast was administered.   COMPARISON:  None.   FINDINGS: Rotator cuff: Complete tear of the supraspinatus tendon with 2.6 cm of retraction. Mild tendinosis of the infraspinatus tendon without a tear. Teres minor tendon is intact. Subscapularis tendon is intact.   Muscles: Mild atrophy of the supraspinatus muscle. Remainder of the rotator cuff muscles are normal. Deltoid musculature is normal.   Biceps long head: Moderate tendinosis of the intra-articular portion of the long head of the biceps tendon.   Acromioclavicular Joint: Mild  arthropathy of the acromioclavicular joint. Type II acromion. Small amount of subacromial/subdeltoid bursal fluid.   Glenohumeral Joint: No joint effusion. Partial-thickness cartilage loss of the glenohumeral joint.   Labrum: Limited evaluation secondary lack of intra-articular fluid. Posterior labral tear.   Bones:  No acute osseous abnormality.  No aggressive osseous lesion.   Other: No fluid collection or hematoma.   IMPRESSION: 1. Complete tear of the supraspinatus tendon with 2.6 cm of retraction. 2. Mild tendinosis of the infraspinatus tendon without a tear. 3. Mild atrophy of the supraspinatus muscle. 4. Moderate tendinosis of the intra-articular portion of the long head of the biceps tendon.     Electronically Signed   By: Kathreen Devoid   On: 03/16/2018 15:33 Physicians involved in your care Any changes since last visit?  no   Family History  Problem Relation Age of Onset  . Hypertension Father   . Dementia Father   . Osteoarthritis Mother   . Arthritis/Rheumatoid Sister   . Breast cancer Unknown        paternal side  . Breast cancer Paternal Aunt   . Breast cancer Cousin        maternal  . Uterine cancer Maternal Grandmother   . Ovarian cancer Neg Hx   . Heart disease Neg Hx   . Diabetes Neg Hx   . Colon cancer Neg Hx    Social History   Socioeconomic History  . Marital status: Divorced    Spouse name: Not on file  . Number of children: 3  . Years of education: 59  . Highest education level:  Not on file  Occupational History    Comment: retired  Scientific laboratory technician  . Financial resource strain: Not on file  . Food insecurity:    Worry: Not on file    Inability: Not on file  . Transportation needs:    Medical: Not on file    Non-medical: Not on file  Tobacco Use  . Smoking status: Former Smoker    Last attempt to quit: 12/29/1979    Years since quitting: 38.5  . Smokeless tobacco: Never Used  Substance and Sexual Activity  . Alcohol use: No     Alcohol/week: 0.0 oz  . Drug use: No  . Sexual activity: Not Currently    Birth control/protection: Surgical  Lifestyle  . Physical activity:    Days per week: Not on file    Minutes per session: Not on file  . Stress: Not on file  Relationships  . Social connections:    Talks on phone: Not on file    Gets together: Not on file    Attends religious service: Not on file    Active member of club or organization: Not on file    Attends meetings of clubs or organizations: Not on file    Relationship status: Not on file  Other Topics Concern  . Not on file  Social History Narrative   Her mother and her daughter live with her   Caffeine- 1 cup coffee   Past Surgical History:  Procedure Laterality Date  . ADENOIDECTOMY    . APPENDECTOMY  1965  . [place]   left  . CATARACT EXTRACTION W/PHACO Right 10/27/2017   Procedure: CATARACT EXTRACTION PHACO AND INTRAOCULAR LENS PLACEMENT ([place]) RIGHT;  Surgeon: Leandrew Koyanagi, MD;  Location: [place];  Service: Ophthalmology;  Laterality: Right;  requests early (8-8:30 arrival)  . CATARACT EXTRACTION W/PHACO Left 11/10/2017   Procedure: CATARACT EXTRACTION PHACO AND INTRAOCULAR LENS PLACEMENT ([place]) LEFT;  Surgeon: Leandrew Koyanagi, MD;  Location: [facility];  Service: Ophthalmology;  Laterality: Left;  . CHOLECYSTECTOMY    . [place]  . laser vein surgery    . LOOP RECORDER INSERTION N/A 09/24/2017   Procedure: LOOP RECORDER INSERTION;  Surgeon: Evans Lance, MD;  Location: [place];  Service: Cardiovascular;  Laterality: N/A;  . MANDIBLE RECONSTRUCTION  1992  . TOENAIL EXCISION  1998   several  . TONSILLECTOMY    . TUBAL LIGATION  1977  . VAGINAL HYSTERECTOMY  1983   als0- anterior/posterior colporrhaphy  . VAGINAL WOUND CLOSURE / REPAIR  1999, 2000  . VAGINAL WOUND CLOSURE / REPAIR     Past Medical History:  Diagnosis Date  . Atrophic vaginitis   . Dysplasia of vagina      vin 1 - vulva and vaginal cuff  . GERD (gastroesophageal reflux disease)   . HCV infection   . HSV (herpes simplex virus) infection   . Hypercholesterolemia   . Hypertension   . Hypothyroidism   . Left-sided weakness    S/P CVA 4/18.  PT 2x/wk.  . Lung nodule    stable  . Menopausal state   . Osteoarthritis    hand involvement, cervical disc disease  . Osteoporosis   . Pelvic pain in female   . Rheumatoid arthritis ([place])   . Stroke (cerebrum) ([place]) 04/24/2017  . Thyroid nodule    pt unaware of this   . Vaginal Pap smear, abnormal    lgsil   BP 131/78 (  BP Location: Right Arm, Patient Position: Sitting, Cuff Size: Normal)   Pulse 66   Resp 14   Wt 105 lb (47.6 kg)   LMP 12/29/1981   SpO2 98%   BMI 18.02 kg/m   Opioid Risk Score:   Fall Risk Score:  `1  Depression screen PHQ 2/9  Depression screen [facility] 2/9 05/03/2018 02/25/2018 05/26/2017 10/23/2016  Decreased Interest 0 0 0 0  Down, Depressed, Hopeless 0 0 0 0  PHQ - 2 Score 0 0 0 0  Some recent data might be hidden    Review of Systems  Constitutional: Positive for appetite change and unexpected weight change.  HENT: Negative.   Eyes: Negative.   Respiratory: Negative.   Cardiovascular: Negative.   Endocrine: Negative.   Musculoskeletal: Positive for arthralgias and gait problem.  Allergic/Immunologic: Negative.   Neurological: Positive for numbness.       Tingling  Hematological: Negative.   Psychiatric/Behavioral: Negative.        Objective:   Physical Exam Motor strength is 3- at the left deltoid biceps 2- triceps 3- finger flexors trace finger extensors. Tone MAS 2 at the elbow flexors, MAS 2 at the wrist flexors and finger flexors. Ambulates without assistive device Speech with mild dysarthria Mood and affect are flat  Arthritic changes DIPs Left shoulder subluxation mild pain with abduction as well as internal/external rotation.     Assessment & Plan:  #1.  Spastic left hemiparesis secondary  to right CVA onset greater than 1 year ago.  We discussed that at this point is unlikely to get much further functional recovery. We discussed that botulinum toxin injections would be helpful for reducing her tone but would need to be repeated on every 26-month basis to maintain tone reduction.  At this point I do not think she would benefit from any further PT or OT.  Repeat botulinum toxin injections same dosage in 6 weeks  2.  Chronic left shoulder pain likely multifactorial does have full-thickness rotator cuff tear also has shoulder subluxation and a weak shoulder girdle.  I do think she would be a good candidate for percutaneous left nerve stimulation however her insurance will not cover this.

## 2018-07-05 DIAGNOSIS — M0609 Rheumatoid arthritis without rheumatoid factor, multiple sites: Secondary | ICD-10-CM | POA: Diagnosis not present

## 2018-07-05 DIAGNOSIS — Z79899 Other long term (current) drug therapy: Secondary | ICD-10-CM | POA: Diagnosis not present

## 2018-07-06 ENCOUNTER — Ambulatory Visit (INDEPENDENT_AMBULATORY_CARE_PROVIDER_SITE_OTHER): Payer: PPO | Admitting: *Deleted

## 2018-07-06 DIAGNOSIS — I639 Cerebral infarction, unspecified: Secondary | ICD-10-CM | POA: Diagnosis not present

## 2018-07-07 NOTE — Progress Notes (Signed)
Carelink Summary Report / Loop Recorder 

## 2018-07-08 ENCOUNTER — Telehealth: Payer: Self-pay | Admitting: Internal Medicine

## 2018-07-08 ENCOUNTER — Other Ambulatory Visit: Payer: Self-pay

## 2018-07-08 MED ORDER — AMLODIPINE BESYLATE 2.5 MG PO TABS: 2.5000 mg | ORAL_TABLET | Freq: Every day | ORAL | 1 refills | Status: DC

## 2018-07-08 NOTE — Telephone Encounter (Signed)
If she has been on this medication and taking regularly, I am ok with refilling.

## 2018-07-08 NOTE — Telephone Encounter (Signed)
Please advise 

## 2018-07-08 NOTE — Telephone Encounter (Signed)
Telephone call from patient requesting refill for Norvasc. 2.5mg        Patient states that heart Dr.  Had originally ordered med.  She no longer see the Dr.     Cassell Clement 06/28/18 with Einar Pheasant   Last refill:  08/28/17   Pharmacy: [facility]

## 2018-07-08 NOTE — Telephone Encounter (Signed)
Copied from [place] [id number]. Topic: Quick Communication - Rx Refill/Question >> Jul 08, 2018 10:39 AM Percell Belt A wrote: Medication: amLODipine (NORVASC) 2.5 MG tablet [[id number]]   Has the patient contacted their pharmacy? No (Agent: If no, request that the patient contact the pharmacy for the refill.) (Agent: If yes, when and what did the pharmacy advise?)  Preferred Pharmacy (with phone number or street name): [facility] [address] , pt stated that he heart dr was giving her this med but she is no longer see that dr  Agent: Please be advised that RX refills may take up to 3 business days. We ask that you follow-up with your pharmacy.

## 2018-07-08 NOTE — Telephone Encounter (Signed)
Patient notified that prescription was sent in.

## 2018-07-11 DIAGNOSIS — H35372 Puckering of macula, left eye: Secondary | ICD-10-CM | POA: Diagnosis not present

## 2018-07-11 LAB — CUP PACEART REMOTE DEVICE CHECK
Date Time Interrogation Session: 20190607180850
Implantable Pulse Generator Implant Date: 20180928

## 2018-07-12 DIAGNOSIS — M0609 Rheumatoid arthritis without rheumatoid factor, multiple sites: Secondary | ICD-10-CM | POA: Diagnosis not present

## 2018-07-12 DIAGNOSIS — M79641 Pain in right hand: Secondary | ICD-10-CM | POA: Diagnosis not present

## 2018-07-12 DIAGNOSIS — M150 Primary generalized (osteo)arthritis: Secondary | ICD-10-CM | POA: Diagnosis not present

## 2018-07-12 DIAGNOSIS — I6389 Other cerebral infarction: Secondary | ICD-10-CM | POA: Diagnosis not present

## 2018-07-12 DIAGNOSIS — Z79899 Other long term (current) drug therapy: Secondary | ICD-10-CM | POA: Diagnosis not present

## 2018-07-25 ENCOUNTER — Ambulatory Visit: Payer: PPO | Attending: Rheumatology | Admitting: Occupational Therapy

## 2018-07-25 ENCOUNTER — Other Ambulatory Visit: Payer: Self-pay

## 2018-07-25 DIAGNOSIS — M79641 Pain in right hand: Secondary | ICD-10-CM | POA: Diagnosis not present

## 2018-07-25 NOTE — Patient Instructions (Signed)
Contrast  2-3 x day  And several times during day - ice massage over 3rd and 4th A1pulleys  MC block splints for 3rd and 4th - to use during tight and sustained grips or repetitive grip  And sleeping   And avoid tight and sustained grips  Avoid repetitive grip  Built up handles and grips

## 2018-07-25 NOTE — Therapy (Signed)
[facility] [address], [place], [address] Phone: [phone]   Fax:  [phone]  Occupational Therapy Evaluation  Patient Details  Name: Donna Rhodes MRN: [id number] Date of Birth: [birth year] Referring Provider: Dr Jefm Bryant    Encounter Date: 07/25/2018  OT End of Session - 07/25/18 1133    Visit Number  1    Number of Visits  8    Date for OT Re-Evaluation  08/22/18    OT Start Time  1038    OT Stop Time  1130    OT Time Calculation (min)  52 min    Activity Tolerance  Patient tolerated treatment well    Behavior During Therapy  [facility] for tasks assessed/performed       Past Medical History:  Diagnosis Date  . Atrophic vaginitis   . Dysplasia of vagina    vin 1 - vulva and vaginal cuff  . GERD (gastroesophageal reflux disease)   . HCV infection   . HSV (herpes simplex virus) infection   . Hypercholesterolemia   . Hypertension   . Hypothyroidism   . Left-sided weakness    S/P CVA 4/18.  PT 2x/wk.  . Lung nodule    stable  . Menopausal state   . Osteoarthritis    hand involvement, cervical disc disease  . Osteoporosis   . Pelvic pain in female   . Rheumatoid arthritis ([place])   . Stroke (cerebrum) ([place]) 04/24/2017  . Thyroid nodule    pt unaware of this   . Vaginal Pap smear, abnormal    lgsil    Past Surgical History:  Procedure Laterality Date  . ADENOIDECTOMY    . APPENDECTOMY  1965  . [place]   left  . CATARACT EXTRACTION W/PHACO Right 10/27/2017   Procedure: CATARACT EXTRACTION PHACO AND INTRAOCULAR LENS PLACEMENT ([place]) RIGHT;  Surgeon: Leandrew Koyanagi, MD;  Location: [place];  Service: Ophthalmology;  Laterality: Right;  requests early (8-8:30 arrival)  . CATARACT EXTRACTION W/PHACO Left 11/10/2017   Procedure: CATARACT EXTRACTION PHACO AND INTRAOCULAR LENS PLACEMENT ([place]) LEFT;  Surgeon: Leandrew Koyanagi, MD;  Location: [place];   Service: Ophthalmology;  Laterality: Left;  . CHOLECYSTECTOMY    . [place]  . laser vein surgery    . LOOP RECORDER INSERTION N/A 09/24/2017   Procedure: LOOP RECORDER INSERTION;  Surgeon: Evans Lance, MD;  Location: [place] [facility];  Service: Cardiovascular;  Laterality: N/A;  . MANDIBLE RECONSTRUCTION  1992  . TOENAIL EXCISION  1998   several  . TONSILLECTOMY    . TUBAL LIGATION  1977  . VAGINAL HYSTERECTOMY  1983   als0- anterior/posterior colporrhaphy  . VAGINAL WOUND CLOSURE / REPAIR  1999, 2000  . VAGINAL WOUND CLOSURE / REPAIR      There were no vitals filed for this visit.  Subjective Assessment - 07/25/18 1044    Subjective   R hand pain started about month - seen Dr Raliegh Ip - did not give me shot - sent me to you - cannot use my L hand since stroke -I have     Patient Stated Goals  Want the pain better in my R hand so I can use it - because cannot use my L hand     Pain Score  3     Pain Location  Hand    Pain Orientation  Right    Pain Descriptors / Indicators  Aching;Throbbing;Sharp;Stabbing  Pain Type  Acute pain    Pain Onset  More than a month ago    Pain Frequency  Intermittent    Aggravating Factors   Using it a lot         [facility] OT Assessment - 07/25/18 0001      Assessment   Medical Diagnosis  R hand pain     Referring Provider  Dr Jefm Bryant     Onset Date/Surgical Date  05/28/18    Hand Dominance  Left    Prior Therapy  -- Had CVA about year ago       [place] With  -- Mom lives with me and daughter      Prior Function   Vocation  Retired    Secretary/administrator , own house work , some cooking , visit with family       Strength   Right Hand Grip (lbs)  35    Right Hand Lateral Pinch  11 lbs    Right Hand 3 Point Pinch  11 lbs      Right Hand AROM   R Thumb Opposition to Index  -- Opposition to base of 5th     R Index  MCP 0-90  80 Degrees    R Index PIP 0-100  100 Degrees    R Long  MCP 0-90  85 Degrees    R  Long PIP 0-100  100 Degrees    R Ring  MCP 0-90  90 Degrees    R Ring PIP 0-100  100 Degrees    R Little  MCP 0-90  90 Degrees    R Little PIP 0-100  100 Degrees       [place] at 20%, 3.3MHZ, 0.8 intensity over 3rd and 4th A1pulley done end of session  HEP review and hand out review:    Contrast  2-3 x day  And several times during day - ice massage over 3rd and 4th A1pulleys  MC block splints for 3rd and 4th - to use during tight and sustained grips or repetitive grip  - was fabricated and pt ed on use  And sleeping   And avoid tight and sustained grips  Avoid repetitive grip  Built up handles and grips              OT Education - 07/25/18 1133    Education provided  Yes    Education Details  Findings of eval and homeprogram    Person(s) Educated  Patient    Methods  Explanation;Demonstration    Comprehension  Verbalized understanding;Returned demonstration       OT Short Term Goals - 07/25/18 1139      OT SHORT TERM GOAL #1   Title  Pain at the worse and tenderness decrease to less than 5/10     Baseline  tenderness but not locking - 10/10 with tenderness     Time  2    Period  Weeks    Status  New    Target Date  08/08/18        OT Long Term Goals - 07/25/18 1140      OT LONG TERM GOAL #1   Title  Pt to be ind in HEP to decrease pain with tenderness  and use to less than 2/10     Baseline  pain 3/10 at [facility] and tenderness 10/10     Time  4    Period  Weeks  Status  New    Target Date  08/22/18            Plan - 07/25/18 1134    Clinical Impression Statement  Pt present at OT eval with referral for R hand pain - 3rd and 4th trigger finger - tender over A1pulleys- 3-10/10 pain - pt report she had some locking but not anymore - started about month ago - pt  has flower garden and had CVA about year ago - that resulted in L  hemiparesis and so maybe over use of R hand  - pt ed on modifications , homeprogram and use of MC block splint for 3rd and 4th  digits - plan to initiate ionto wiht dexamethazone     Occupational performance deficits (Please refer to evaluation for details):  ADL's;IADL's;Leisure    Rehab Potential  Fair    Current Impairments/barriers affecting progress:  L hemiparesis - not able to use L hand - over use of R hand     OT Frequency  2x / week    OT Duration  4 weeks    OT Treatment/Interventions  Self-care/ADL training;Iontophoresis;Patient/family education;Splinting;Manual Therapy;Ultrasound;Contrast [place]    Plan  assess progress with HEP and use of splint     Clinical Decision Making  Limited treatment options, no task modification necessary    OT Home Exercise Plan  see pt instruction    Consulted and Agree with Plan of Care  Patient       Patient will benefit from skilled therapeutic intervention in order to improve the following deficits and impairments:  Pain, Impaired UE functional use  Visit Diagnosis: Pain in right hand - Plan: Ot plan of care cert/re-cert    Problem List Patient Active Problem List   Diagnosis Date Noted  . Complete tear of left rotator cuff 02/28/2018  . Rotator cuff tendinitis, left 02/28/2018  . Left arm pain 11/05/2017  . Nocturia 08/13/2017  . Urge incontinence 08/13/2017  . Adhesive capsulitis of left shoulder 08/06/2017  . Increased frequency of urination 07/11/2017  . Gait disturbance, post-stroke 05/31/2017  . Spastic hemiparesis of left nondominant side due to acute cerebral infarction ([place]) 05/31/2017  . Adjustment disorder with mixed anxiety and depressed mood   . Hypokalemia   . Flaccid monoplegia of upper extremity ([place])   . Benign essential HTN   . 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 ([place]) 04/28/2017  . 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[place]) 04/27/2017  . CVA (cerebral vascular accident) ([place]) 04/24/2017  . Palpitations 04/23/2017  . LGSIL Pap smear of vagina 12/15/2016  . Anal skin tag 02/02/2016  . Inflammatory arthritis 01/19/2016  . Right  knee pain 01/19/2016  . Genital herpes 10/20/2015  . GERD (gastroesophageal reflux disease) 10/20/2015  . Status post vaginal hysterectomy 10/20/2015  . Health care maintenance 05/30/2015  . LLQ pain 03/31/2014  . Menopausal symptoms 03/31/2014  . Generalized abdominal pain 10/05/2013  . Female stress incontinence 08/18/2013  . Incomplete emptying of bladder 08/18/2013  . Bladder pain 08/18/2013  . Hypercholesterolemia 12/31/2012  . Hypothyroidism, unspecified 12/29/2012  . Osteoarthritis 12/29/2012  . Benign neoplasm of mouth 09/14/2012  . PULMONARY NODULE 01/02/2008  . Mild depression ([place]) 12/30/2007  . Essential hypertension 12/30/2007  . Depression 12/30/2007    Rosalyn Gess OTR/l,CLT 07/25/2018, 11:44 AM  [place] [facility] [address] [place], [address] Phone: [phone]   Fax:  [phone]  Name: Donna Rhodes MRN: [id number] Date of Birth:  04/04/1946 

## 2018-07-29 ENCOUNTER — Ambulatory Visit: Payer: PPO | Attending: Rheumatology | Admitting: Occupational Therapy

## 2018-07-29 DIAGNOSIS — M6281 Muscle weakness (generalized): Secondary | ICD-10-CM | POA: Insufficient documentation

## 2018-07-29 DIAGNOSIS — M79641 Pain in right hand: Secondary | ICD-10-CM | POA: Diagnosis not present

## 2018-07-29 NOTE — Patient Instructions (Signed)
Not to sleep with 2 MC block splint  And do not wear all the time - ONLY with tight or sustained grip act

## 2018-07-29 NOTE — Therapy (Signed)
[place] [facility] [address] [place], [address] Phone: [phone]   Fax:  [phone]  Occupational Therapy Treatment  Patient Details  Name: Donna Rhodes MRN: [id number] Date of Birth: 06-10-1946 Referring Provider: Dr Jefm Bryant    Encounter Date: 07/29/2018  OT End of Session - 07/29/18 1019    Visit Number  2    Number of Visits  8    Date for OT Re-Evaluation  08/22/18    OT Start Time  0935    OT Stop Time  1019    OT Time Calculation (min)  44 min    Activity Tolerance  Patient tolerated treatment well    Behavior During Therapy  [facility] for tasks assessed/performed       Past Medical History:  Diagnosis Date  . Atrophic vaginitis   . Dysplasia of vagina    vin 1 - vulva and vaginal cuff  . GERD (gastroesophageal reflux disease)   . HCV infection   . HSV (herpes simplex virus) infection   . Hypercholesterolemia   . Hypertension   . Hypothyroidism   . Left-sided weakness    S/P CVA 4/18.  PT 2x/wk.  . Lung nodule    stable  . Menopausal state   . Osteoarthritis    hand involvement, cervical disc disease  . Osteoporosis   . Pelvic pain in female   . Rheumatoid arthritis ([place])   . Stroke (cerebrum) ([place]) 04/24/2017  . Thyroid nodule    pt unaware of this   . Vaginal Pap smear, abnormal    lgsil    Past Surgical History:  Procedure Laterality Date  . ADENOIDECTOMY    . APPENDECTOMY  1965  . [place]   left  . CATARACT EXTRACTION W/PHACO Right 10/27/2017   Procedure: CATARACT EXTRACTION PHACO AND INTRAOCULAR LENS PLACEMENT ([place]) RIGHT;  Surgeon: Leandrew Koyanagi, MD;  Location: [place];  Service: Ophthalmology;  Laterality: Right;  requests early (8-8:30 arrival)  . CATARACT EXTRACTION W/PHACO Left 11/10/2017   Procedure: CATARACT EXTRACTION PHACO AND INTRAOCULAR LENS PLACEMENT ([place]) LEFT;  Surgeon: Leandrew Koyanagi, MD;  Location: [place];   Service: Ophthalmology;  Laterality: Left;  . CHOLECYSTECTOMY    . [place]  . laser vein surgery    . LOOP RECORDER INSERTION N/A 09/24/2017   Procedure: LOOP RECORDER INSERTION;  Surgeon: Evans Lance, MD;  Location: [place] [facility];  Service: Cardiovascular;  Laterality: N/A;  . MANDIBLE RECONSTRUCTION  1992  . TOENAIL EXCISION  1998   several  . TONSILLECTOMY    . TUBAL LIGATION  1977  . VAGINAL HYSTERECTOMY  1983   als0- anterior/posterior colporrhaphy  . VAGINAL WOUND CLOSURE / REPAIR  1999, 2000  . VAGINAL WOUND CLOSURE / REPAIR      There were no vitals filed for this visit.  Subjective Assessment - 07/29/18 1017    Subjective   Pain in my hand is better -they don't lock - those splints bother my knuckles and in my palm - I wore them all the time     Patient Stated Goals  Want the pain better in my R hand so I can use it - because cannot use my L hand     Currently in Pain?  Yes    Pain Score  3     Pain Location  -- tenderness A1pulley at 4th     Pain Orientation  Right  Pain Descriptors / Indicators  Aching;Tender      pt still tender about 3/10 on A1 pulley at 4th - not 3rd  Denies triggering   can make full fist - AROM WFL in L hand   Assess 2 MC block splint -and adjusted them little and padded  To fit more comfortable  Reinforce with pt to wear only with tight and sustained grip   pt was wearing it all the time- pt not to  And don't have to sleep with it      Cont with  several times during day - ice massage over 3rd and 4th A1pulleys   And avoid tight and sustained grips  Avoid repetitive grip  Built up handles and grips          skin check done prior and pt ed on what to expect   OT Treatments/Exercises (OP) - 07/29/18 0001      Iontophoresis   Type of Iontophoresis  Dexamethasone    Location  A1pulley of 4th and 3rd     Dose  small patch    Time  19        Not to sleep with 2 MC block splint  And do not wear  all the time - ONLY with tight or sustained grip act        OT Education - 07/29/18 1019    Education provided  Yes    Education Details  wearing of splint     Person(s) Educated  Patient    Methods  Explanation;Demonstration    Comprehension  Verbalized understanding;Returned demonstration       OT Short Term Goals - 07/25/18 1139      OT SHORT TERM GOAL #1   Title  Pain at the worse and tenderness decrease to less than 5/10     Baseline  tenderness but not locking - 10/10 with tenderness     Time  2    Period  Weeks    Status  New    Target Date  08/08/18        OT Long Term Goals - 07/25/18 1140      OT LONG TERM GOAL #1   Title  Pt to be ind in HEP to decrease pain with tenderness  and use to less than 2/10     Baseline  pain 3/10 at [facility] and tenderness 10/10     Time  4    Period  Weeks    Status  New    Target Date  08/22/18            Plan - 07/29/18 1019    Clinical Impression Statement  Pt cont to be tender over A1pulley at 4th - not as tender at 3rd - she denies any triggering - pt did wear 2 MC block splint all the time - and c/o some discomfort - reinforce for pt that she ONLY wear it with tight and sustained grip - and do not have to sleep with it - ionto done this date     Occupational performance deficits (Please refer to evaluation for details):  ADL's;IADL's;Leisure    Rehab Potential  Fair    Current Impairments/barriers affecting progress:  L hemiparesis - not able to use L hand - over use of R hand     OT Frequency  2x / week    OT Duration  4 weeks    OT Treatment/Interventions  Self-care/ADL training;Iontophoresis;Patient/family education;Splinting;Manual Therapy;Ultrasound;Contrast [employer]  Plan  assess progress with HEP and use of splints    Clinical Decision Making  Limited treatment options, no task modification necessary    OT Home Exercise Plan  see pt instruction    Consulted and Agree with Plan of Care  Patient       Patient  will benefit from skilled therapeutic intervention in order to improve the following deficits and impairments:  Pain, Impaired UE functional use  Visit Diagnosis: Pain in right hand  Muscle weakness (generalized)    Problem List Patient Active Problem List   Diagnosis Date Noted  . Complete tear of left rotator cuff 02/28/2018  . Rotator cuff tendinitis, left 02/28/2018  . Left arm pain 11/05/2017  . Nocturia 08/13/2017  . Urge incontinence 08/13/2017  . Adhesive capsulitis of left shoulder 08/06/2017  . Increased frequency of urination 07/11/2017  . Gait disturbance, post-stroke 05/31/2017  . Spastic hemiparesis of left nondominant side due to acute cerebral infarction ([place]) 05/31/2017  . Adjustment disorder with mixed anxiety and depressed mood   . Hypokalemia   . Flaccid monoplegia of upper extremity ([place])   . Benign essential HTN   . 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 ([place]) 04/28/2017  . 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[place]) 04/27/2017  . CVA (cerebral vascular accident) ([place]) 04/24/2017  . Palpitations 04/23/2017  . LGSIL Pap smear of vagina 12/15/2016  . Anal skin tag 02/02/2016  . Inflammatory arthritis 01/19/2016  . Right knee pain 01/19/2016  . Genital herpes 10/20/2015  . GERD (gastroesophageal reflux disease) 10/20/2015  . Status post vaginal hysterectomy 10/20/2015  . Health care maintenance 05/30/2015  . LLQ pain 03/31/2014  . Menopausal symptoms 03/31/2014  . Generalized abdominal pain 10/05/2013  . Female stress incontinence 08/18/2013  . Incomplete emptying of bladder 08/18/2013  . Bladder pain 08/18/2013  . Hypercholesterolemia 12/31/2012  . Hypothyroidism, unspecified 12/29/2012  . Osteoarthritis 12/29/2012  . Benign neoplasm of mouth 09/14/2012  . PULMONARY NODULE 01/02/2008  . Mild depression ([place]) 12/30/2007  . Essential hypertension 12/30/2007  . Depression 12/30/2007    Rosalyn Gess OTR/L,CLT 07/29/2018, 11:23 AM  [place] [facility] [address] [place], [address] Phone: [phone]   Fax:  [phone]  Name: Donna Rhodes MRN: [id number] Date of Birth: April 15, 1946

## 2018-08-01 ENCOUNTER — Ambulatory Visit: Payer: PPO | Admitting: Occupational Therapy

## 2018-08-01 DIAGNOSIS — M79641 Pain in right hand: Secondary | ICD-10-CM | POA: Diagnosis not present

## 2018-08-01 NOTE — Patient Instructions (Signed)
Same

## 2018-08-01 NOTE — Therapy (Signed)
[facility] [address] [place], [address] Phone: [phone]   Fax:  [phone]  Occupational Therapy Treatment  Patient Details  Name: Donna Rhodes MRN: [id number] Date of Birth: 1946/02/27 Referring Provider: Dr Jefm Bryant    Encounter Date: 08/01/2018  OT End of Session - 08/01/18 2051    Visit Number  3    Number of Visits  8    Date for OT Re-Evaluation  08/22/18    OT Start Time  1504    OT Stop Time  1553    OT Time Calculation (min)  49 min    Activity Tolerance  Patient tolerated treatment well    Behavior During Therapy  [facility] for tasks assessed/performed       Past Medical History:  Diagnosis Date  . Atrophic vaginitis   . Dysplasia of vagina    vin 1 - vulva and vaginal cuff  . GERD (gastroesophageal reflux disease)   . HCV infection   . HSV (herpes simplex virus) infection   . Hypercholesterolemia   . Hypertension   . Hypothyroidism   . Left-sided weakness    S/P CVA 4/18.  PT 2x/wk.  . Lung nodule    stable  . Menopausal state   . Osteoarthritis    hand involvement, cervical disc disease  . Osteoporosis   . Pelvic pain in female   . Rheumatoid arthritis ([place])   . Stroke (cerebrum) ([place]) 04/24/2017  . Thyroid nodule    pt unaware of this   . Vaginal Pap smear, abnormal    lgsil    Past Surgical History:  Procedure Laterality Date  . ADENOIDECTOMY    . APPENDECTOMY  1965  . [place]   left  . CATARACT EXTRACTION W/PHACO Right 10/27/2017   Procedure: CATARACT EXTRACTION PHACO AND INTRAOCULAR LENS PLACEMENT ([place]) RIGHT;  Surgeon: Leandrew Koyanagi, MD;  Location: [place];  Service: Ophthalmology;  Laterality: Right;  requests early (8-8:30 arrival)  . CATARACT EXTRACTION W/PHACO Left 11/10/2017   Procedure: CATARACT EXTRACTION PHACO AND INTRAOCULAR LENS PLACEMENT ([place]) LEFT;  Surgeon: Leandrew Koyanagi, MD;  Location: [place];   Service: Ophthalmology;  Laterality: Left;  . CHOLECYSTECTOMY    . [place]  . laser vein surgery    . LOOP RECORDER INSERTION N/A 09/24/2017   Procedure: LOOP RECORDER INSERTION;  Surgeon: Evans Lance, MD;  Location: [place] [facility];  Service: Cardiovascular;  Laterality: N/A;  . MANDIBLE RECONSTRUCTION  1992  . TOENAIL EXCISION  1998   several  . TONSILLECTOMY    . TUBAL LIGATION  1977  . VAGINAL HYSTERECTOMY  1983   als0- anterior/posterior colporrhaphy  . VAGINAL WOUND CLOSURE / REPAIR  1999, 2000  . VAGINAL WOUND CLOSURE / REPAIR      There were no vitals filed for this visit.  Subjective Assessment - 08/01/18 2049    Subjective   I used my a lot since I seen you - I pruned my roses and dig up some plants - my ring finger in the palm about 5/10 when you push on it - but no triggering     Patient Stated Goals  Want the pain better in my R hand so I can use it - because cannot use my L hand     Currently in Pain?  Yes    Pain Score  5     Pain Location  Hand  Pain Orientation  Right    Pain Descriptors / Indicators  Tender    Pain Type  Acute pain    Pain Onset  More than a month ago          pt still tender about 5/10 on A1 pulley at 4th - not 3rd  Denies triggering   can make full fist - AROM WFL in L hand   Cont with 2 MC block splint - Reinforce with pt to wear only with tight and sustained grip   pt was wearing it all the time- pt not to  And don't have to sleep with it      Cont with  several times during day - ice massage over 3rd and 4th A1pulleys   And avoid tight and sustained grips  Avoid repetitive grip  Built up handles and grips   skin check done prior and pt ed on what to expect           OT Treatments/Exercises (OP) - 08/01/18 0001      Iontophoresis   Type of Iontophoresis  Dexamethasone    Location  A1pulley of 4th and 3rd     Dose  small patch    Time  19      RUE Contrast Bath   Time  11 minutes     Comments  on R hand prior to soft tissue       contrast done prior to manual to decrease tenderness       OT Education - 08/01/18 2051    Education provided  Yes    Education Details  modifications for grip     Person(s) Educated  Patient    Methods  Explanation;Demonstration    Comprehension  Verbalized understanding;Returned demonstration       OT Short Term Goals - 07/25/18 1139      OT SHORT TERM GOAL #1   Title  Pain at the worse and tenderness decrease to less than 5/10     Baseline  tenderness but not locking - 10/10 with tenderness     Time  2    Period  Weeks    Status  New    Target Date  08/08/18        OT Long Term Goals - 07/25/18 1140      OT LONG TERM GOAL #1   Title  Pt to be ind in HEP to decrease pain with tenderness  and use to less than 2/10     Baseline  pain 3/10 at [facility] and tenderness 10/10     Time  4    Period  Weeks    Status  New    Target Date  08/22/18            Plan - 08/01/18 2052    Clinical Impression Statement  Pt cont to have pain and tenderness over A1pulley at 4th digit on R hand - she used her hand a lot in garden since last time and tenderness increase from 3 to 5/10 - pt reed on using joimt modificattions  avoiding tight and sustined grip - and using prnuning scissors - pt to wear MC block splints on 3rd and 4th  with gripping act     Occupational performance deficits (Please refer to evaluation for details):  ADL's;IADL's;Leisure    Rehab Potential  Fair    Current Impairments/barriers affecting progress:  L hemiparesis - not able to use L hand - over use of R hand  OT Frequency  2x / week    OT Duration  4 weeks    OT Treatment/Interventions  Self-care/ADL training;Iontophoresis;Patient/family education;Splinting;Manual Therapy;Ultrasound;Contrast Bath    Plan  assess progress with HEP and use of splints    Clinical Decision Making  Limited treatment options, no task modification necessary    OT Home Exercise  Plan  see pt instruction    Consulted and Agree with Plan of Care  Patient       Patient will benefit from skilled therapeutic intervention in order to improve the following deficits and impairments:  Pain, Impaired UE functional use  Visit Diagnosis: Pain in right hand    Problem List Patient Active Problem List   Diagnosis Date Noted  . Complete tear of left rotator cuff 02/28/2018  . Rotator cuff tendinitis, left 02/28/2018  . Left arm pain 11/05/2017  . Nocturia 08/13/2017  . Urge incontinence 08/13/2017  . Adhesive capsulitis of left shoulder 08/06/2017  . Increased frequency of urination 07/11/2017  . Gait disturbance, post-stroke 05/31/2017  . Spastic hemiparesis of left nondominant side due to acute cerebral infarction ([place]) 05/31/2017  . Adjustment disorder with mixed anxiety and depressed mood   . Hypokalemia   . Flaccid monoplegia of upper extremity ([place])   . Benign essential HTN   . 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 ([place]) 04/28/2017  . 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[place]) 04/27/2017  . CVA (cerebral vascular accident) ([place]) 04/24/2017  . Palpitations 04/23/2017  . LGSIL Pap smear of vagina 12/15/2016  . Anal skin tag 02/02/2016  . Inflammatory arthritis 01/19/2016  . Right knee pain 01/19/2016  . Genital herpes 10/20/2015  . GERD (gastroesophageal reflux disease) 10/20/2015  . Status post vaginal hysterectomy 10/20/2015  . Health care maintenance 05/30/2015  . LLQ pain 03/31/2014  . Menopausal symptoms 03/31/2014  . Generalized abdominal pain 10/05/2013  . Female stress incontinence 08/18/2013  . Incomplete emptying of bladder 08/18/2013  . Bladder pain 08/18/2013  . Hypercholesterolemia 12/31/2012  . Hypothyroidism, unspecified 12/29/2012  . Osteoarthritis 12/29/2012  . Benign neoplasm of mouth 09/14/2012  . PULMONARY NODULE 01/02/2008  . Mild depression ([place]) 12/30/2007  . Essential hypertension 12/30/2007  . Depression 12/30/2007     Rosalyn Gess OTR/L,CLT 08/01/2018, 8:55 PM  [facility] [address], [place], [address] Phone: [phone]   Fax:  [phone]  Name: Donna Rhodes MRN: [id number] Date of Birth: 1946-08-05

## 2018-08-08 ENCOUNTER — Ambulatory Visit (INDEPENDENT_AMBULATORY_CARE_PROVIDER_SITE_OTHER): Payer: PPO | Admitting: *Deleted

## 2018-08-08 ENCOUNTER — Encounter: Payer: PPO | Admitting: Occupational Therapy

## 2018-08-08 DIAGNOSIS — I639 Cerebral infarction, unspecified: Secondary | ICD-10-CM | POA: Diagnosis not present

## 2018-08-09 ENCOUNTER — Encounter: Payer: Self-pay | Admitting: Physical Medicine & Rehabilitation

## 2018-08-09 ENCOUNTER — Ambulatory Visit: Payer: PPO | Admitting: Physical Medicine & Rehabilitation

## 2018-08-09 ENCOUNTER — Encounter: Payer: PPO | Attending: Physical Medicine & Rehabilitation

## 2018-08-09 VITALS — BP 127/81 | HR 73 | Ht 64.0 in | Wt 105.0 lb

## 2018-08-09 DIAGNOSIS — E785 Hyperlipidemia, unspecified: Secondary | ICD-10-CM | POA: Diagnosis not present

## 2018-08-09 DIAGNOSIS — M7989 Other specified soft tissue disorders: Secondary | ICD-10-CM | POA: Diagnosis not present

## 2018-08-09 DIAGNOSIS — Z7982 Long term (current) use of aspirin: Secondary | ICD-10-CM | POA: Diagnosis not present

## 2018-08-09 DIAGNOSIS — K219 Gastro-esophageal reflux disease without esophagitis: Secondary | ICD-10-CM | POA: Insufficient documentation

## 2018-08-09 DIAGNOSIS — Z7902 Long term (current) use of antithrombotics/antiplatelets: Secondary | ICD-10-CM | POA: Insufficient documentation

## 2018-08-09 DIAGNOSIS — I10 Essential (primary) hypertension: Secondary | ICD-10-CM | POA: Diagnosis not present

## 2018-08-09 DIAGNOSIS — G8114 Spastic hemiplegia affecting left nondominant side: Secondary | ICD-10-CM | POA: Insufficient documentation

## 2018-08-09 DIAGNOSIS — R269 Unspecified abnormalities of gait and mobility: Secondary | ICD-10-CM | POA: Insufficient documentation

## 2018-08-09 DIAGNOSIS — R531 Weakness: Secondary | ICD-10-CM | POA: Diagnosis not present

## 2018-08-09 DIAGNOSIS — I69354 Hemiplegia and hemiparesis following cerebral infarction affecting left non-dominant side: Secondary | ICD-10-CM | POA: Diagnosis not present

## 2018-08-09 DIAGNOSIS — M069 Rheumatoid arthritis, unspecified: Secondary | ICD-10-CM | POA: Diagnosis not present

## 2018-08-09 NOTE — Patient Instructions (Signed)

## 2018-08-09 NOTE — Progress Notes (Signed)
Carelink Summary Report / Loop Recorder 

## 2018-08-09 NOTE — Progress Notes (Signed)
Botox Injection for spasticity using needle EMG guidance  Dilution: 50 Units/ml Indication: Severe spasticity which interferes with ADL,mobility and/or  hygiene and is unresponsive to medication management and other conservative care Informed consent was obtained after describing risks and benefits of the procedure with the patient. This includes bleeding, bruising, infection, excessive weakness, or medication side effects. A REMS form is on file and signed. Needle: 27g 1" needle electrode Number of units per muscle Biceps50 FCR25 FCU0 FDS25 FDP50 FPL0 Brachioradialis 50 All injections were done after obtaining appropriate EMG activity and after negative drawback for blood. The patient tolerated the procedure well. Post procedure instructions were given. A followup appointment was made.   Still complaining of Left post stroke shoulder pain , not relieved by OT and Gabapentin TID rec increase to QID, Pt now seeing ortho for potential acromioplasty. We discussed this may help repair tear but there are additional pain generators related to stroke that surgery would not address.  Rec for PENS Sprint SPR Left axillary nerve stim

## 2018-08-11 LAB — CUP PACEART REMOTE DEVICE CHECK
Implantable Pulse Generator Implant Date: 20180928
MDC IDC SESS DTM: 20190710183911

## 2018-08-24 ENCOUNTER — Ambulatory Visit: Payer: PPO | Admitting: Diagnostic Neuroimaging

## 2018-09-08 ENCOUNTER — Other Ambulatory Visit: Payer: Self-pay | Admitting: Internal Medicine

## 2018-09-12 ENCOUNTER — Ambulatory Visit (INDEPENDENT_AMBULATORY_CARE_PROVIDER_SITE_OTHER): Payer: PPO | Admitting: *Deleted

## 2018-09-12 DIAGNOSIS — I639 Cerebral infarction, unspecified: Secondary | ICD-10-CM | POA: Diagnosis not present

## 2018-09-12 NOTE — Progress Notes (Signed)
Carelink Summary Report / Loop Recorder 

## 2018-09-15 LAB — CUP PACEART REMOTE DEVICE CHECK
Implantable Pulse Generator Implant Date: 20180928
MDC IDC SESS DTM: 20190812193930

## 2018-09-20 ENCOUNTER — Other Ambulatory Visit (INDEPENDENT_AMBULATORY_CARE_PROVIDER_SITE_OTHER): Payer: PPO

## 2018-09-20 ENCOUNTER — Ambulatory Visit: Payer: PPO | Admitting: Physical Medicine & Rehabilitation

## 2018-09-20 ENCOUNTER — Encounter: Payer: PPO | Attending: Physical Medicine & Rehabilitation

## 2018-09-20 ENCOUNTER — Encounter: Payer: Self-pay | Admitting: Physical Medicine & Rehabilitation

## 2018-09-20 VITALS — BP 136/71 | HR 69 | Resp 14 | Ht 64.0 in | Wt 105.0 lb

## 2018-09-20 DIAGNOSIS — M7989 Other specified soft tissue disorders: Secondary | ICD-10-CM | POA: Diagnosis not present

## 2018-09-20 DIAGNOSIS — K219 Gastro-esophageal reflux disease without esophagitis: Secondary | ICD-10-CM | POA: Diagnosis not present

## 2018-09-20 DIAGNOSIS — I10 Essential (primary) hypertension: Secondary | ICD-10-CM | POA: Insufficient documentation

## 2018-09-20 DIAGNOSIS — E785 Hyperlipidemia, unspecified: Secondary | ICD-10-CM | POA: Diagnosis not present

## 2018-09-20 DIAGNOSIS — E7800 Pure hypercholesterolemia, unspecified: Secondary | ICD-10-CM

## 2018-09-20 DIAGNOSIS — R531 Weakness: Secondary | ICD-10-CM | POA: Insufficient documentation

## 2018-09-20 DIAGNOSIS — I69354 Hemiplegia and hemiparesis following cerebral infarction affecting left non-dominant side: Secondary | ICD-10-CM | POA: Insufficient documentation

## 2018-09-20 DIAGNOSIS — G8114 Spastic hemiplegia affecting left nondominant side: Secondary | ICD-10-CM | POA: Insufficient documentation

## 2018-09-20 DIAGNOSIS — Z7982 Long term (current) use of aspirin: Secondary | ICD-10-CM | POA: Insufficient documentation

## 2018-09-20 DIAGNOSIS — Z7902 Long term (current) use of antithrombotics/antiplatelets: Secondary | ICD-10-CM | POA: Insufficient documentation

## 2018-09-20 DIAGNOSIS — M069 Rheumatoid arthritis, unspecified: Secondary | ICD-10-CM | POA: Insufficient documentation

## 2018-09-20 DIAGNOSIS — R269 Unspecified abnormalities of gait and mobility: Secondary | ICD-10-CM | POA: Diagnosis not present

## 2018-09-20 LAB — LIPID PANEL
CHOL/HDL RATIO: 3
Cholesterol: 142 mg/dL (ref 0–200)
HDL: 54.4 mg/dL (ref >39.00)
LDL Cholesterol: 67 mg/dL (ref 0–99)
NonHDL: 87.98
TRIGLYCERIDES: 104 mg/dL (ref 0.0–149.0)
VLDL: 20.8 mg/dL (ref 0.0–40.0)

## 2018-09-20 LAB — CBC WITH DIFFERENTIAL/PLATELET
BASOS ABS: 0.1 10*3/uL (ref 0.0–0.1)
Basophils Relative: 1.8 % (ref 0.0–3.0)
Eosinophils Absolute: 0.2 10*3/uL (ref 0.0–0.7)
Eosinophils Relative: 3 % (ref 0.0–5.0)
HCT: 37 % (ref 36.0–46.0)
Hemoglobin: 12.3 g/dL (ref 12.0–15.0)
LYMPHS ABS: 1.5 10*3/uL (ref 0.7–4.0)
Lymphocytes Relative: 27.5 % (ref 12.0–46.0)
MCHC: 33.1 g/dL (ref 30.0–36.0)
MCV: 94.3 fl (ref 78.0–100.0)
MONO ABS: 0.5 10*3/uL (ref 0.1–1.0)
MONOS PCT: 8.7 % (ref 3.0–12.0)
Neutro Abs: 3.3 10*3/uL (ref 1.4–7.7)
Neutrophils Relative %: 59 % (ref 43.0–77.0)
Platelets: 229 10*3/uL (ref 150.0–400.0)
RBC: 3.93 Mil/uL (ref 3.87–5.11)
RDW: 14 % (ref 11.5–15.5)
WBC: 5.6 10*3/uL (ref 4.0–10.5)

## 2018-09-20 LAB — BASIC METABOLIC PANEL
BUN: 17 mg/dL (ref 6–23)
CALCIUM: 9.3 mg/dL (ref 8.4–10.5)
CO2: 30 mEq/L (ref 19–32)
CREATININE: 0.75 mg/dL (ref 0.40–1.20)
Chloride: 105 mEq/L (ref 96–112)
GFR: 80.54 mL/min (ref >60.00)
Glucose, Bld: 98 mg/dL (ref 70–99)
Potassium: 3.9 mEq/L (ref 3.5–5.1)
SODIUM: 141 meq/L (ref 135–145)

## 2018-09-20 LAB — HEPATIC FUNCTION PANEL
ALBUMIN: 4.1 g/dL (ref 3.5–5.2)
ALK PHOS: 73 U/L (ref 39–117)
ALT: 18 U/L (ref 0–35)
AST: 23 U/L (ref 0–37)
Bilirubin, Direct: 0.1 mg/dL (ref 0.0–0.3)
TOTAL PROTEIN: 7 g/dL (ref 6.0–8.3)
Total Bilirubin: 0.6 mg/dL (ref 0.2–1.2)

## 2018-09-20 NOTE — Progress Notes (Signed)
Subjective:    Patient ID: Donna Rhodes, female    DOB: 03-11-1946, 72 y.o.   MRN: [id number]  HPI Botox injection  08/12/18 Biceps50 FCR25 FCU0 FDS25 FDP50 FPL0 Brachioradialis 50  Good relaxation of hand and wrist, still able to grasp with Left hand.  No post injection soreness or excess weakness  Left arm still still pulls across body, elbow flexors do not relax Left foot inverts with standing and walking Pain Inventory Average Pain 1 Pain Right Now 0 My pain is aching  In the last 24 hours, has pain interfered with the following? General activity 0 Relation with others 0 Enjoyment of life 7 What TIME of day is your pain at its worst? varies Sleep (in general) Poor  Pain is worse with: unsure Pain improves with: rest and heat/ice Relief from Meds: 6  Mobility walk without assistance how many minutes can you walk? 10 ability to climb steps?  yes do you drive?  yes  Function retired I need assistance with the following:  meal prep and household duties  Neuro/Psych No problems in this area  Prior Studies Any changes since last visit?  no  Physicians involved in your care Any changes since last visit?  no   Family History  Problem Relation Age of Onset  . Hypertension Father   . Dementia Father   . Osteoarthritis Mother   . Arthritis/Rheumatoid Sister   . Breast cancer Unknown        paternal side  . Breast cancer Paternal Aunt   . Breast cancer Cousin        maternal  . Uterine cancer Maternal Grandmother   . Ovarian cancer Neg Hx   . Heart disease Neg Hx   . Diabetes Neg Hx   . Colon cancer Neg Hx    Social History   Socioeconomic History  . Marital status: Divorced    Spouse name: Not on file  . Number of children: 3  . Years of education: 1  . Highest education level: Not on file  Occupational History    Comment: retired  Scientific laboratory technician  . Financial resource strain: Not on file  . Food insecurity:    Worry: Not on file   Inability: Not on file  . Transportation needs:    Medical: Not on file    Non-medical: Not on file  Tobacco Use  . Smoking status: Former Smoker    Last attempt to quit: 12/29/1979    Years since quitting: 38.7  . Smokeless tobacco: Never Used  Substance and Sexual Activity  . Alcohol use: No    Alcohol/week: 0.0 standard drinks  . Drug use: No  . Sexual activity: Not Currently    Birth control/protection: Surgical  Lifestyle  . Physical activity:    Days per week: Not on file    Minutes per session: Not on file  . Stress: Not on file  Relationships  . Social connections:    Talks on phone: Not on file    Gets together: Not on file    Attends religious service: Not on file    Active member of club or organization: Not on file    Attends meetings of clubs or organizations: Not on file    Relationship status: Not on file  Other Topics Concern  . Not on file  Social History Narrative   Her mother and her daughter live with her   Caffeine- 1 cup coffee   Past Surgical History:  Procedure  Laterality Date  . ADENOIDECTOMY    . APPENDECTOMY  1965  . [place]   left  . CATARACT EXTRACTION W/PHACO Right 10/27/2017   Procedure: CATARACT EXTRACTION PHACO AND INTRAOCULAR LENS PLACEMENT ([place]) RIGHT;  Surgeon: Leandrew Koyanagi, MD;  Location: [place];  Service: Ophthalmology;  Laterality: Right;  requests early (8-8:30 arrival)  . CATARACT EXTRACTION W/PHACO Left 11/10/2017   Procedure: CATARACT EXTRACTION PHACO AND INTRAOCULAR LENS PLACEMENT ([place]) LEFT;  Surgeon: Leandrew Koyanagi, MD;  Location: [facility];  Service: Ophthalmology;  Laterality: Left;  . CHOLECYSTECTOMY    . [place]  . laser vein surgery    . LOOP RECORDER INSERTION N/A 09/24/2017   Procedure: LOOP RECORDER INSERTION;  Surgeon: Evans Lance, MD;  Location: [place] [facility];  Service: Cardiovascular;  Laterality: N/A;  . MANDIBLE RECONSTRUCTION  1992  . TOENAIL  EXCISION  1998   several  . TONSILLECTOMY    . TUBAL LIGATION  1977  . VAGINAL HYSTERECTOMY  1983   als0- anterior/posterior colporrhaphy  . VAGINAL WOUND CLOSURE / REPAIR  1999, 2000  . VAGINAL WOUND CLOSURE / REPAIR     Past Medical History:  Diagnosis Date  . Atrophic vaginitis   . Dysplasia of vagina    vin 1 - vulva and vaginal cuff  . GERD (gastroesophageal reflux disease)   . HCV infection   . HSV (herpes simplex virus) infection   . Hypercholesterolemia   . Hypertension   . Hypothyroidism   . Left-sided weakness    S/P CVA 4/18.  PT 2x/wk.  . Lung nodule    stable  . Menopausal state   . Osteoarthritis    hand involvement, cervical disc disease  . Osteoporosis   . Pelvic pain in female   . Rheumatoid arthritis ([place])   . 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 Thyroid nodule    pt unaware of this   . Vaginal Pap smear, abnormal    lgsil   BP 136/71   Pulse 69   Resp 14   Ht 5\' 4"  (1.626 m)   Wt 105 lb (47.6 kg)   LMP 12/29/1981   SpO2 97%   BMI 18.02 kg/m   Opioid Risk Score:   Fall Risk Score:  `1  Depression screen PHQ 2/9  Depression screen [facility] 2/9 05/03/2018 02/25/2018 05/26/2017 10/23/2016  Decreased Interest 0 0 0 0  Down, Depressed, Hopeless 0 0 0 0  PHQ - 2 Score 0 0 0 0  Some recent data might be hidden    Review of Systems  Constitutional: Negative.   HENT: Negative.   Eyes: Negative.   Respiratory: Negative.   Cardiovascular: Negative.   Gastrointestinal: Negative.   Endocrine: Negative.   Genitourinary: Negative.   Musculoskeletal: Positive for arthralgias, neck pain and neck stiffness.  Skin: Negative.   Allergic/Immunologic: Negative.   Hematological: Negative.   Psychiatric/Behavioral: Negative.        Objective:   Physical Exam  Constitutional: She appears well-developed and well-nourished. No distress.  HENT:  Head: Normocephalic and atraumatic.  Skin: She is not diaphoretic.  Nursing note and vitals  reviewed.    Mildly reduced sensation left hemifacial and LUE     Assessment & Plan:  1.  Left spastic hemiplegia due to Right PLIC , int cap infarct, likely some involvement of thalamus to explain sensory symptoms  Biceps50 FCR25 FCU0 FDS25 FDP50 FPL0 Brachioradialis 50  Add  Left pectoralis- 50 Left PT-  50 Re inject in 6 wks with 6 wk post injection follow up  2.  Left shoulder pain overall improved- off gabapentin , cont to monitor

## 2018-09-20 NOTE — Patient Instructions (Addendum)
Will change botox dose include left leg injection

## 2018-09-22 ENCOUNTER — Ambulatory Visit (INDEPENDENT_AMBULATORY_CARE_PROVIDER_SITE_OTHER): Payer: PPO | Admitting: Internal Medicine

## 2018-09-22 DIAGNOSIS — Z23 Encounter for immunization: Secondary | ICD-10-CM

## 2018-09-22 DIAGNOSIS — F320 Major depressive disorder, single episode, mild: Secondary | ICD-10-CM

## 2018-09-22 DIAGNOSIS — Z8673 Personal history of transient ischemic attack (TIA), and cerebral infarction without residual deficits: Secondary | ICD-10-CM | POA: Diagnosis not present

## 2018-09-22 DIAGNOSIS — I10 Essential (primary) hypertension: Secondary | ICD-10-CM

## 2018-09-22 DIAGNOSIS — M1990 Unspecified osteoarthritis, unspecified site: Secondary | ICD-10-CM | POA: Diagnosis not present

## 2018-09-22 DIAGNOSIS — M0600 Rheumatoid arthritis without rheumatoid factor, unspecified site: Secondary | ICD-10-CM

## 2018-09-22 DIAGNOSIS — M1380 Other specified arthritis, unspecified site: Secondary | ICD-10-CM

## 2018-09-22 DIAGNOSIS — F32A Depression, unspecified: Secondary | ICD-10-CM

## 2018-09-22 DIAGNOSIS — E039 Hypothyroidism, unspecified: Secondary | ICD-10-CM | POA: Diagnosis not present

## 2018-09-22 DIAGNOSIS — E7800 Pure hypercholesterolemia, unspecified: Secondary | ICD-10-CM

## 2018-09-22 DIAGNOSIS — F4323 Adjustment disorder with mixed anxiety and depressed mood: Secondary | ICD-10-CM

## 2018-09-22 MED ORDER — NYSTATIN 100000 UNIT/ML MT SUSP: 5.0000 mL | Freq: Three times a day (TID) | OROMUCOSAL | 0 refills | Status: DC | PRN

## 2018-09-22 MED ORDER — CITALOPRAM HYDROBROMIDE 20 MG PO TABS: 20.0000 mg | ORAL_TABLET | Freq: Every day | ORAL | 2 refills | Status: DC

## 2018-09-22 NOTE — Progress Notes (Signed)
Patient ID: Donna Rhodes, female   DOB: 1946-03-02, 72 y.o.   MRN: [id number]   Subjective:    Patient ID: Donna Rhodes, female    DOB: 06/20/1946, 72 y.o.   MRN: [id number]  HPI  Patient here for a scheduled follow up.  She reports she is doing some better.  Still with increased stress and frustration since her stroke.  Still not able to do things she wants to do.  Is able to get outside some.  Trying to use her hand and arm more.  Also right hand pain.  Some problems with trigger finger.  Saw Dr Jefm Bryant.  Therapy.  Soaking in warm water.  Discussed possible injection. Declines at this time.   No chest pain.  No sob.  No acid reflux.  No abdominal pain.  Bowels moving.  Discussed increasing citalopram.  Discussed recent labs.    Past Medical History:  Diagnosis Date  . Atrophic vaginitis   . Dysplasia of vagina    vin 1 - vulva and vaginal cuff  . GERD (gastroesophageal reflux disease)   . HCV infection   . HSV (herpes simplex virus) infection   . Hypercholesterolemia   . Hypertension   . Hypothyroidism   . Left-sided weakness    S/P CVA 4/18.  PT 2x/wk.  . Lung nodule    stable  . Menopausal state   . Osteoarthritis    hand involvement, cervical disc disease  . Osteoporosis   . Pelvic pain in female   . Rheumatoid arthritis ([place])   . 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 Thyroid nodule    pt unaware of this   . Vaginal Pap smear, abnormal    lgsil   Past Surgical History:  Procedure Laterality Date  . ADENOIDECTOMY    . APPENDECTOMY  1965  . [place]   left  . CATARACT EXTRACTION W/PHACO Right 10/27/2017   Procedure: CATARACT EXTRACTION PHACO AND INTRAOCULAR LENS PLACEMENT ([place]) RIGHT;  Surgeon: Leandrew Koyanagi, MD;  Location: [facility];  Service: Ophthalmology;  Laterality: Right;  requests early (8-8:30 arrival)  . CATARACT EXTRACTION W/PHACO Left 11/10/2017   Procedure: CATARACT EXTRACTION PHACO AND INTRAOCULAR LENS PLACEMENT ([place])  LEFT;  Surgeon: Leandrew Koyanagi, MD;  Location: [facility];  Service: Ophthalmology;  Laterality: Left;  . CHOLECYSTECTOMY    . [place]  . laser vein surgery    . LOOP RECORDER INSERTION N/A 09/24/2017   Procedure: LOOP RECORDER INSERTION;  Surgeon: Evans Lance, MD;  Location: [facility];  Service: Cardiovascular;  Laterality: N/A;  . MANDIBLE RECONSTRUCTION  1992  . TOENAIL EXCISION  1998   several  . TONSILLECTOMY    . TUBAL LIGATION  1977  . VAGINAL HYSTERECTOMY  1983   als0- anterior/posterior colporrhaphy  . VAGINAL WOUND CLOSURE / REPAIR  1999, 2000  . VAGINAL WOUND CLOSURE / REPAIR     Family History  Problem Relation Age of Onset  . Hypertension Father   . Dementia Father   . Osteoarthritis Mother   . Arthritis/Rheumatoid Sister   . Breast cancer Unknown        paternal side  . Breast cancer Paternal Aunt   . Breast cancer Cousin        maternal  . Uterine cancer Maternal Grandmother   . Ovarian cancer Neg Hx   . Heart disease Neg Hx   . Diabetes Neg Hx   . Colon cancer Neg Hx  Social History   Socioeconomic History  . Marital status: Divorced    Spouse name: Not on file  . Number of children: 3  . Years of education: 62  . Highest education level: Not on file  Occupational History    Comment: retired  Scientific laboratory technician  . Financial resource strain: Not on file  . Food insecurity:    Worry: Not on file    Inability: Not on file  . Transportation needs:    Medical: Not on file    Non-medical: Not on file  Tobacco Use  . Smoking status: Former Smoker    Last attempt to quit: 12/29/1979    Years since quitting: 38.7  . Smokeless tobacco: Never Used  Substance and Sexual Activity  . Alcohol use: No    Alcohol/week: 0.0 standard drinks  . Drug use: No  . Sexual activity: Not Currently    Birth control/protection: Surgical  Lifestyle  . Physical activity:    Days per week: Not on file    Minutes per session: Not on  file  . Stress: Not on file  Relationships  . Social connections:    Talks on phone: Not on file    Gets together: Not on file    Attends religious service: Not on file    Active member of club or organization: Not on file    Attends meetings of clubs or organizations: Not on file    Relationship status: Not on file  Other Topics Concern  . Not on file  Social History Narrative   Her mother and her daughter live with her   Caffeine- 1 cup coffee    Outpatient Encounter Medications as of 09/22/2018  Medication Sig  . acetaminophen (TYLENOL) 500 MG tablet Take 500-1,000 mg by mouth every 6 (six) hours as needed for mild pain.  Marland Kitchen acyclovir (ZOVIRAX) 200 MG capsule TAKE ONE CAPSULE BY MOUTH TWICE DAILY  . amLODipine (NORVASC) 2.5 MG tablet Take 1 tablet (2.5 mg total) by mouth daily.  Marland Kitchen atorvastatin (LIPITOR) 20 MG tablet TAKE 1 TABLET BY MOUTH ONCE DAILY  . calcium-vitamin D (OSCAL WITH D) 250-125 MG-UNIT tablet Take 1 tablet by mouth daily.  . clopidogrel (PLAVIX) 75 MG tablet Take 1 tablet (75 mg total) by mouth daily.  . folic acid (FOLVITE) 1 MG tablet Take 1 mg by mouth daily.  Marland Kitchen gabapentin (NEURONTIN) 100 MG capsule Take 1 capsule by mouth 4 (four) times daily.  Marland Kitchen levothyroxine (SYNTHROID, LEVOTHROID) 50 MCG tablet TAKE 1 TABLET BY MOUTH ONCE DAILY BEFORE BREAKFAST  . methotrexate 50 MG/2ML injection Inject 0.53mL under the skin once a weekly on Wednesday  . Multiple Vitamin (MULTIVITAMIN WITH MINERALS) TABS tablet Take 1 tablet by mouth daily.  . pantoprazole (PROTONIX) 20 MG tablet Take 1 tablet (20 mg total) by mouth daily.  . [DISCONTINUED] citalopram (CELEXA) 10 MG tablet TAKE 1 TABLET BY MOUTH ONCE DAILY  . citalopram (CELEXA) 20 MG tablet Take 1 tablet (20 mg total) by mouth daily.  Marland Kitchen nystatin (MYCOSTATIN) 100000 UNIT/ML suspension Take 5 mLs (500,000 Units total) by mouth 3 (three) times daily as needed.   No facility-administered encounter medications on file as of  09/22/2018.     Review of Systems  Constitutional: Negative for appetite change and unexpected weight change.  HENT: Negative for congestion and sinus pressure.   Respiratory: Negative for cough, chest tightness and shortness of breath.   Cardiovascular: Negative for chest pain, palpitations and leg swelling.  Gastrointestinal: Negative  for abdominal pain, diarrhea, nausea and vomiting.  Genitourinary: Negative for difficulty urinating and dysuria.  Musculoskeletal:       Right hand pain.  Trigger finger.  Persistent left arm/hand weakness.    Skin: Negative for color change and rash.  Neurological: Negative for dizziness, light-headedness and headaches.  Psychiatric/Behavioral: Negative for agitation and dysphoric mood.       Objective:    Physical Exam  Constitutional: She appears well-developed and well-nourished. No distress.  HENT:  Nose: Nose normal.  Mouth/Throat: Oropharynx is clear and moist.  Neck: Neck supple. No thyromegaly present.  Cardiovascular: Normal rate and regular rhythm.  Pulmonary/Chest: Breath sounds normal. No respiratory distress. She has no wheezes.  Abdominal: Soft. Bowel sounds are normal. There is no tenderness.  Musculoskeletal: She exhibits no edema or tenderness.  Lymphadenopathy:    She has no cervical adenopathy.  Skin: No rash noted. No erythema.  Psychiatric: She has a normal mood and affect. Her behavior is normal.    BP 116/62 (BP Location: Left Arm, Patient Position: Sitting, Cuff Size: Normal)   Pulse 74   Temp 98.1 F (36.7 C) (Oral)   Resp 18   Wt 105 lb (47.6 kg)   LMP 12/29/1981   SpO2 97%   BMI 18.02 kg/m  Wt Readings from Last 3 Encounters:  09/22/18 105 lb (47.6 kg)  09/20/18 105 lb (47.6 kg)  08/09/18 105 lb (47.6 kg)     Lab Results  Component Value Date   WBC 5.6 09/20/2018   HGB 12.3 09/20/2018   HCT 37.0 09/20/2018   PLT 229.0 09/20/2018   GLUCOSE 98 09/20/2018   CHOL 142 09/20/2018   TRIG 104.0  09/20/2018   HDL 54.40 09/20/2018   LDLDIRECT 128.0 05/25/2013   LDLCALC 67 09/20/2018   ALT 18 09/20/2018   AST 23 09/20/2018   NA 141 09/20/2018   K 3.9 09/20/2018   CL 105 09/20/2018   CREATININE 0.75 09/20/2018   BUN 17 09/20/2018   CO2 30 09/20/2018   TSH 3.09 12/13/2017   HGBA1C 5.7 (H) 04/25/2017    Mr Shoulder Left Wo Contrast  Result Date: 03/16/2018 CLINICAL DATA:  Fall in April 2018 and hit shoulder on corner of furniture. Continued pain with limited ROM and weakness. EXAM: MRI OF THE LEFT SHOULDER WITHOUT CONTRAST TECHNIQUE: Multiplanar, multisequence MR imaging of the shoulder was performed. No intravenous contrast was administered. COMPARISON:  None. FINDINGS: Rotator cuff: Complete tear of the supraspinatus tendon with 2.6 cm of retraction. Mild tendinosis of the infraspinatus tendon without a tear. Teres minor tendon is intact. Subscapularis tendon is intact. Muscles: Mild atrophy of the supraspinatus muscle. Remainder of the rotator cuff muscles are normal. Deltoid musculature is normal. Biceps long head: Moderate tendinosis of the intra-articular portion of the long head of the biceps tendon. Acromioclavicular Joint: Mild arthropathy of the acromioclavicular joint. Type II acromion. Small amount of subacromial/subdeltoid bursal fluid. Glenohumeral Joint: No joint effusion. Partial-thickness cartilage loss of the glenohumeral joint. Labrum: Limited evaluation secondary lack of intra-articular fluid. Posterior labral tear. Bones:  No acute osseous abnormality.  No aggressive osseous lesion. Other: No fluid collection or hematoma. IMPRESSION: 1. Complete tear of the supraspinatus tendon with 2.6 cm of retraction. 2. Mild tendinosis of the infraspinatus tendon without a tear. 3. Mild atrophy of the supraspinatus muscle. 4. Moderate tendinosis of the intra-articular portion of the long head of the biceps tendon. Electronically Signed   By: Kathreen Devoid   On: 03/16/2018 15:33  Assessment & Plan:   Problem List Items Addressed This Visit    Adjustment disorder with mixed anxiety and depressed mood    Discussed with her today.  Increased stress.  Have discussed referral to counselor/psychiatry.  Increase citalopram to 20mg  q day.  She desires no further intervention.  Follow.        Benign essential HTN    Blood pressure under good control.  Continue same medication regimen.  Follow pressures.  Follow metabolic panel.        Relevant Orders   Basic metabolic panel   History of CVA (cerebrovascular accident)    Resulting left hemiparesis of left upper extremity.  On plavix.        Hypercholesterolemia    On lipitor.  Follow lipid panel and liver function tests.   Lab Results  Component Value Date   CHOL 142 09/20/2018   HDL 54.40 09/20/2018   LDLCALC 67 09/20/2018   LDLDIRECT 128.0 05/25/2013   TRIG 104.0 09/20/2018   CHOLHDL 3 09/20/2018        Relevant Orders   Lipid panel   Hepatic function panel   Hypothyroidism, unspecified    On thyroid replacement.  Follow tsh.       Relevant Orders   TSH   Inflammatory arthritis    Followed by Dr Jefm Bryant.  On MTX.       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  Discussed with her today.  Increased frustration since her stroke as outlined.  Increase citalopram to 20mg  q day.  Follow.        Relevant Medications   citalopram (CELEXA) 20 MG tablet   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 ([place])    Followed by Dr Jefm Bryant.  On MTX.           Einar Pheasant, MD

## 2018-09-25 ENCOUNTER — Encounter: Payer: Self-pay | Admitting: Internal Medicine

## 2018-09-25 LAB — CUP PACEART REMOTE DEVICE CHECK
Date Time Interrogation Session: 20190914193709
MDC IDC PG IMPLANT DT: 20180928

## 2018-09-25 NOTE — Assessment & Plan Note (Signed)
Blood pressure under good control.  Continue same medication regimen.  Follow pressures.  Follow metabolic panel.   

## 2018-09-25 NOTE — Assessment & Plan Note (Signed)
On citalopram.  Discussed with her today.  Increased frustration since her stroke as outlined.  Increase citalopram to 20mg  q day.  Follow.

## 2018-09-25 NOTE — Assessment & Plan Note (Signed)
Followed by Dr Kernodle.  On MTX.   

## 2018-09-25 NOTE — Assessment & Plan Note (Signed)
Discussed with her today.  Increased stress.  Have discussed referral to counselor/psychiatry.  Increase citalopram to 20mg  q day.  She desires no further intervention.  Follow.

## 2018-09-25 NOTE — Assessment & Plan Note (Signed)
On thyroid replacement.  Follow tsh.  

## 2018-09-25 NOTE — Assessment & Plan Note (Signed)
Resulting left hemiparesis of left upper extremity.  On plavix.

## 2018-09-25 NOTE — Assessment & Plan Note (Signed)
On lipitor.  Follow lipid panel and liver function tests.   Lab Results  Component Value Date   CHOL 142 09/20/2018   HDL 54.40 09/20/2018   LDLCALC 67 09/20/2018   LDLDIRECT 128.0 05/25/2013   TRIG 104.0 09/20/2018   CHOLHDL 3 09/20/2018

## 2018-09-27 ENCOUNTER — Other Ambulatory Visit: Payer: PPO

## 2018-09-27 ENCOUNTER — Other Ambulatory Visit (INDEPENDENT_AMBULATORY_CARE_PROVIDER_SITE_OTHER): Payer: PPO

## 2018-09-27 DIAGNOSIS — M1990 Unspecified osteoarthritis, unspecified site: Secondary | ICD-10-CM | POA: Diagnosis not present

## 2018-09-27 DIAGNOSIS — F4323 Adjustment disorder with mixed anxiety and depressed mood: Secondary | ICD-10-CM | POA: Diagnosis not present

## 2018-09-27 DIAGNOSIS — M0600 Rheumatoid arthritis without rheumatoid factor, unspecified site: Secondary | ICD-10-CM | POA: Diagnosis not present

## 2018-09-27 DIAGNOSIS — F320 Major depressive disorder, single episode, mild: Secondary | ICD-10-CM | POA: Diagnosis not present

## 2018-09-27 DIAGNOSIS — E039 Hypothyroidism, unspecified: Secondary | ICD-10-CM | POA: Diagnosis not present

## 2018-09-27 DIAGNOSIS — E7800 Pure hypercholesterolemia, unspecified: Secondary | ICD-10-CM

## 2018-09-27 DIAGNOSIS — Z23 Encounter for immunization: Secondary | ICD-10-CM

## 2018-09-27 DIAGNOSIS — Z8673 Personal history of transient ischemic attack (TIA), and cerebral infarction without residual deficits: Secondary | ICD-10-CM | POA: Diagnosis not present

## 2018-09-27 DIAGNOSIS — I10 Essential (primary) hypertension: Secondary | ICD-10-CM

## 2018-09-27 LAB — HEPATIC FUNCTION PANEL
ALK PHOS: 73 U/L (ref 39–117)
ALT: 16 U/L (ref 0–35)
AST: 22 U/L (ref 0–37)
Albumin: 4.3 g/dL (ref 3.5–5.2)
BILIRUBIN DIRECT: 0.1 mg/dL (ref 0.0–0.3)
BILIRUBIN TOTAL: 0.5 mg/dL (ref 0.2–1.2)
TOTAL PROTEIN: 7.1 g/dL (ref 6.0–8.3)

## 2018-09-27 LAB — LIPID PANEL
CHOL/HDL RATIO: 2
Cholesterol: 143 mg/dL (ref 0–200)
HDL: 57.6 mg/dL (ref >39.00)
LDL CALC: 69 mg/dL (ref 0–99)
NONHDL: 85.57
TRIGLYCERIDES: 81 mg/dL (ref 0.0–149.0)
VLDL: 16.2 mg/dL (ref 0.0–40.0)

## 2018-09-27 LAB — TSH: TSH: 2.06 u[IU]/mL (ref 0.35–4.50)

## 2018-09-27 LAB — BASIC METABOLIC PANEL
BUN: 19 mg/dL (ref 6–23)
CO2: 29 meq/L (ref 19–32)
CREATININE: 0.74 mg/dL (ref 0.40–1.20)
Calcium: 9.5 mg/dL (ref 8.4–10.5)
Chloride: 106 mEq/L (ref 96–112)
GFR: 81.79 mL/min (ref >60.00)
Glucose, Bld: 79 mg/dL (ref 70–99)
Potassium: 4 mEq/L (ref 3.5–5.1)
SODIUM: 141 meq/L (ref 135–145)

## 2018-09-28 ENCOUNTER — Encounter: Payer: Self-pay | Admitting: *Deleted

## 2018-09-28 ENCOUNTER — Other Ambulatory Visit: Payer: PPO

## 2018-10-10 ENCOUNTER — Other Ambulatory Visit: Payer: Self-pay | Admitting: Internal Medicine

## 2018-10-10 MED ORDER — AMLODIPINE BESYLATE 2.5 MG PO TABS: 2.5000 mg | ORAL_TABLET | Freq: Every day | ORAL | 0 refills | Status: DC

## 2018-10-10 NOTE — Telephone Encounter (Signed)
Copied from [place] [id number]. Topic: Quick Communication - Rx Refill/Question >> Oct 10, 2018  8:49 AM [name] R wrote: Medication:  amLODipine (NORVASC) 2.5 MG tablet   Has the patient contacted their pharmacy? Yes, 90 Day Supply  Preferred Pharmacy (with phone number or street name): [facility] [address] [place] - [place] [phone] (Phone) [phone] (Fax)    Agent: Please be advised that RX refills may take up to 3 business days. We ask that you follow-up with your pharmacy.

## 2018-10-10 NOTE — Telephone Encounter (Signed)
Requested Prescriptions  Pending Prescriptions Disp Refills  . amLODipine (NORVASC) 2.5 MG tablet 90 tablet 1    Sig: Take 1 tablet (2.5 mg total) by mouth daily.     Cardiovascular:  Calcium Channel Blockers Passed - 10/10/2018  3:18 PM      Passed - Last BP in normal range    BP Readings from Last 1 Encounters:  09/22/18 116/62         Passed - Valid encounter within last 6 months    Recent Outpatient Visits          2 weeks ago Adjustment disorder with mixed anxiety and depressed mood   [facility] [place], [place], MD   4 months ago Adjustment disorder with mixed anxiety and depressed mood   [facility] [place], [place] Patient, MD   7 months ago Dysuria   [facility] [place], [place] Patient, MD   8 months ago Estrogen deficiency   [facility] Einar Pheasant, MD   11 months ago Health care maintenance   [place], MD      Future Appointments            In 1 month Einar Pheasant, MD [place], [facility]

## 2018-10-13 ENCOUNTER — Ambulatory Visit (INDEPENDENT_AMBULATORY_CARE_PROVIDER_SITE_OTHER): Payer: PPO | Admitting: *Deleted

## 2018-10-13 DIAGNOSIS — I639 Cerebral infarction, unspecified: Secondary | ICD-10-CM | POA: Diagnosis not present

## 2018-10-14 NOTE — Progress Notes (Signed)
Carelink Summary Report / Loop Recorder 

## 2018-10-24 ENCOUNTER — Telehealth: Payer: Self-pay | Admitting: Internal Medicine

## 2018-10-24 NOTE — Telephone Encounter (Signed)
Patient was placed on doxycycline previously. Would you like for me to have her come in for a urine check or try to work her in this week?

## 2018-10-24 NOTE — Telephone Encounter (Signed)
Will need to be seen.  I can work her in for this on Wednesday at 1:00.  If needs to be seen before this, can see if someone has open spot here or if urgent today - acute care.

## 2018-10-24 NOTE — Telephone Encounter (Signed)
Patient says that she is not coming in for a visit, she does not know why she needs to come in for a visit, it is just a way for the place to make money and she's not doing it. Refused to make any appt.

## 2018-10-24 NOTE — Telephone Encounter (Signed)
Copied from [place] [phone]. Topic: Quick Communication - See Telephone Encounter >> Oct 24, 2018 12:57 PM Blase Mess A wrote: CRM for notification. See Telephone encounter for: 10/24/18.  Patient is calling because she feels like she is having another bladder infection -pressure and she is would Dr. Nicki Reaper to call in the previous medicine that she took. Patient does not know the name of the medicine. Please advise [phone]

## 2018-10-25 LAB — CUP PACEART REMOTE DEVICE CHECK
Implantable Pulse Generator Implant Date: 20180928
MDC IDC SESS DTM: 20191017201038

## 2018-11-01 ENCOUNTER — Ambulatory Visit: Payer: PPO | Admitting: Physical Medicine & Rehabilitation

## 2018-11-01 ENCOUNTER — Encounter: Payer: Self-pay | Admitting: Physical Medicine & Rehabilitation

## 2018-11-01 ENCOUNTER — Encounter: Payer: PPO | Attending: Physical Medicine & Rehabilitation

## 2018-11-01 VITALS — BP 120/72 | HR 67 | Wt 104.8 lb

## 2018-11-01 DIAGNOSIS — I69354 Hemiplegia and hemiparesis following cerebral infarction affecting left non-dominant side: Secondary | ICD-10-CM | POA: Diagnosis not present

## 2018-11-01 DIAGNOSIS — K219 Gastro-esophageal reflux disease without esophagitis: Secondary | ICD-10-CM | POA: Diagnosis not present

## 2018-11-01 DIAGNOSIS — Z7982 Long term (current) use of aspirin: Secondary | ICD-10-CM | POA: Insufficient documentation

## 2018-11-01 DIAGNOSIS — M7989 Other specified soft tissue disorders: Secondary | ICD-10-CM | POA: Diagnosis not present

## 2018-11-01 DIAGNOSIS — G8114 Spastic hemiplegia affecting left nondominant side: Secondary | ICD-10-CM | POA: Insufficient documentation

## 2018-11-01 DIAGNOSIS — R531 Weakness: Secondary | ICD-10-CM | POA: Insufficient documentation

## 2018-11-01 DIAGNOSIS — R269 Unspecified abnormalities of gait and mobility: Secondary | ICD-10-CM | POA: Diagnosis not present

## 2018-11-01 DIAGNOSIS — Z7902 Long term (current) use of antithrombotics/antiplatelets: Secondary | ICD-10-CM | POA: Diagnosis not present

## 2018-11-01 DIAGNOSIS — M069 Rheumatoid arthritis, unspecified: Secondary | ICD-10-CM | POA: Diagnosis not present

## 2018-11-01 DIAGNOSIS — E785 Hyperlipidemia, unspecified: Secondary | ICD-10-CM | POA: Insufficient documentation

## 2018-11-01 DIAGNOSIS — I10 Essential (primary) hypertension: Secondary | ICD-10-CM | POA: Insufficient documentation

## 2018-11-01 NOTE — Patient Instructions (Signed)
Shoulder Exercises Ask your health care provider which exercises are safe for you. Do exercises exactly as told by your health care provider and adjust them as directed. It is normal to feel mild stretching, pulling, tightness, or discomfort as you do these exercises, but you should stop right away if you feel sudden pain or your pain gets worse.Do not begin these exercises until told by your health care provider. RANGE OF MOTION EXERCISES These exercises warm up your muscles and joints and improve the movement and flexibility of your shoulder. These exercises also help to relieve pain, numbness, and tingling. These exercises involve stretching your injured shoulder directly. Exercise A: Pendulum  1. Stand near a wall or a surface that you can hold onto for balance. 2. Bend at the waist and let your left / right arm hang straight down. Use your other arm to support you. Keep your back straight and do not lock your knees. 3. Relax your left / right arm and shoulder muscles, and move your hips and your trunk so your left / right arm swings freely. Your arm should swing because of the motion of your body, not because you are using your arm or shoulder muscles. 4. Keep moving your body so your arm swings in the following directions, as told by your health care provider: ? Side to side. ? Forward and backward. ? In clockwise and counterclockwise circles. 5. Continue each motion for __________ seconds, or for as long as told by your health care provider. 6. Slowly return to the starting position. Repeat __________ times. Complete this exercise __________ times a day. Exercise B:Flexion, Standing  1. Stand and hold a broomstick, a cane, or a similar object. Place your hands a little more than shoulder-width apart on the object. Your left / right hand should be palm-up, and your other hand should be palm-down. 2. Keep your elbow straight and keep your shoulder muscles relaxed. Push the stick down with  your healthy arm to raise your left / right arm in front of your body, and then over your head until you feel a stretch in your shoulder. ? Avoid shrugging your shoulder while you raise your arm. Keep your shoulder blade tucked down toward the middle of your back. 3. Hold for __________ seconds. 4. Slowly return to the starting position. Repeat __________ times. Complete this exercise __________ times a day. Exercise C: Abduction, Standing 1. Stand and hold a broomstick, a cane, or a similar object. Place your hands a little more than shoulder-width apart on the object. Your left / right hand should be palm-up, and your other hand should be palm-down. 2. While keeping your elbow straight and your shoulder muscles relaxed, push the stick across your body toward your left / right side. Raise your left / right arm to the side of your body and then over your head until you feel a stretch in your shoulder. ? Do not raise your arm above shoulder height, unless your health care provider tells you to do that. ? Avoid shrugging your shoulder while you raise your arm. Keep your shoulder blade tucked down toward the middle of your back. 3. Hold for __________ seconds. 4. Slowly return to the starting position. Repeat __________ times. Complete this exercise __________ times a day. Exercise D:Internal Rotation  1. Place your left / right hand behind your back, palm-up. 2. Use your other hand to dangle an exercise band, a towel, or a similar object over your shoulder. Grasp the band with   your left / right hand so you are holding onto both ends. 3. Gently pull up on the band until you feel a stretch in the front of your left / right shoulder. ? Avoid shrugging your shoulder while you raise your arm. Keep your shoulder blade tucked down toward the middle of your back. 4. Hold for __________ seconds. 5. Release the stretch by letting go of the band and lowering your hands. Repeat __________ times. Complete  this exercise __________ times a day. STRETCHING EXERCISES These exercises warm up your muscles and joints and improve the movement and flexibility of your shoulder. These exercises also help to relieve pain, numbness, and tingling. These exercises are done using your healthy shoulder to help stretch the muscles of your injured shoulder. Exercise E: Corner Stretch (External Rotation and Abduction)  1. Stand in a doorway with one of your feet slightly in front of the other. This is called a staggered stance. If you cannot reach your forearms to the door frame, stand facing a corner of a room. 2. Choose one of the following positions as told by your health care provider: ? Place your hands and forearms on the door frame above your head. ? Place your hands and forearms on the door frame at the height of your head. ? Place your hands on the door frame at the height of your elbows. 3. Slowly move your weight onto your front foot until you feel a stretch across your chest and in the front of your shoulders. Keep your head and chest upright and keep your abdominal muscles tight. 4. Hold for __________ seconds. 5. To release the stretch, shift your weight to your back foot. Repeat __________ times. Complete this stretch __________ times a day. Exercise F:Extension, Standing 1. Stand and hold a broomstick, a cane, or a similar object behind your back. ? Your hands should be a little wider than shoulder-width apart. ? Your palms should face away from your back. 2. Keeping your elbows straight and keeping your shoulder muscles relaxed, move the stick away from your body until you feel a stretch in your shoulder. ? Avoid shrugging your shoulders while you move the stick. Keep your shoulder blade tucked down toward the middle of your back. 3. Hold for __________ seconds. 4. Slowly return to the starting position. Repeat __________ times. Complete this exercise __________ times a day. STRENGTHENING  EXERCISES These exercises build strength and endurance in your shoulder. Endurance is the ability to use your muscles for a long time, even after they get tired. Exercise G:External Rotation  1. Sit in a stable chair without armrests. 2. Secure an exercise band at elbow height on your left / right side. 3. Place a soft object, such as a folded towel or a small pillow, between your left / right upper arm and your body to move your elbow a few inches away (about 10 cm) from your side. 4. Hold the end of the band so it is tight and there is no slack. 5. Keeping your elbow pressed against the soft object, move your left / right forearm out, away from your abdomen. Keep your body steady so only your forearm moves. 6. Hold for __________ seconds. 7. Slowly return to the starting position. Repeat __________ times. Complete this exercise __________ times a day. Exercise H:Shoulder Abduction  1. Sit in a stable chair without armrests, or stand. 2. Hold a __________ weight in your left / right hand, or hold an exercise band with both hands.   3. Start with your arms straight down and your left / right palm facing in, toward your body. 4. Slowly lift your left / right hand out to your side. Do not lift your hand above shoulder height unless your health care provider tells you that this is safe. ? Keep your arms straight. ? Avoid shrugging your shoulder while you do this movement. Keep your shoulder blade tucked down toward the middle of your back. 5. Hold for __________ seconds. 6. Slowly lower your arm, and return to the starting position. Repeat __________ times. Complete this exercise __________ times a day. Exercise I:Shoulder Extension 1. Sit in a stable chair without armrests, or stand. 2. Secure an exercise band to a stable object in front of you where it is at shoulder height. 3. Hold one end of the exercise band in each hand. Your palms should face each other. 4. Straighten your elbows and  lift your hands up to shoulder height. 5. Step back, away from the secured end of the exercise band, until the band is tight and there is no slack. 6. Squeeze your shoulder blades together as you pull your hands down to the sides of your thighs. Stop when your hands are straight down by your sides. Do not let your hands go behind your body. 7. Hold for __________ seconds. 8. Slowly return to the starting position. Repeat __________ times. Complete this exercise __________ times a day. Exercise J:Standing Shoulder Row 1. Sit in a stable chair without armrests, or stand. 2. Secure an exercise band to a stable object in front of you so it is at waist height. 3. Hold one end of the exercise band in each hand. Your palms should be in a thumbs-up position. 4. Bend each of your elbows to an "L" shape (about 90 degrees) and keep your upper arms at your sides. 5. Step back until the band is tight and there is no slack. 6. Slowly pull your elbows back behind you. 7. Hold for __________ seconds. 8. Slowly return to the starting position. Repeat __________ times. Complete this exercise __________ times a day. Exercise K:Shoulder Press-Ups  1. Sit in a stable chair that has armrests. Sit upright, with your feet flat on the floor. 2. Put your hands on the armrests so your elbows are bent and your fingers are pointing forward. Your hands should be about even with the sides of your body. 3. Push down on the armrests and use your arms to lift yourself off of the chair. Straighten your elbows and lift yourself up as much as you comfortably can. ? Move your shoulder blades down, and avoid letting your shoulders move up toward your ears. ? Keep your feet on the ground. As you get stronger, your feet should support less of your body weight as you lift yourself up. 4. Hold for __________ seconds. 5. Slowly lower yourself back into the chair. Repeat __________ times. Complete this exercise __________ times a  day. Exercise L: Wall Push-Ups  1. Stand so you are facing a stable wall. Your feet should be about one arm-length away from the wall. 2. Lean forward and place your palms on the wall at shoulder height. 3. Keep your feet flat on the floor as you bend your elbows and lean forward toward the wall. 4. Hold for __________ seconds. 5. Straighten your elbows to push yourself back to the starting position. Repeat __________ times. Complete this exercise __________ times a day. This information is not intended to replace advice   given to you by your health care provider. Make sure you discuss any questions you have with your health care provider. Document Released: 10/28/2005 Document Revised: 09/07/2016 Document Reviewed: 08/25/2015 Elsevier Interactive Patient Education  2018 [employer]  

## 2018-11-01 NOTE — Progress Notes (Signed)
Botox Injection for spasticity using needle EMG guidance  Dilution: 50 Units/ml Indication: Severe spasticity which interferes with ADL,mobility and/or  hygiene and is unresponsive to medication management and other conservative care Informed consent was obtained after describing risks and benefits of the procedure with the patient. This includes bleeding, bruising, infection, excessive weakness, or medication side effects. A REMS form is on file and signed. Needle: 27g 1" needle electrode Number of units per muscle Biceps100 FCR25  FDS25 FDP50   Left pectoralis- 50 Left PT- 50 All injections were done after obtaining appropriate EMG activity and after negative drawback for blood. The patient tolerated the procedure well. Post procedure instructions were given. A followup appointment was made.

## 2018-11-15 ENCOUNTER — Ambulatory Visit (INDEPENDENT_AMBULATORY_CARE_PROVIDER_SITE_OTHER): Payer: PPO

## 2018-11-15 DIAGNOSIS — I639 Cerebral infarction, unspecified: Secondary | ICD-10-CM

## 2018-11-17 NOTE — Progress Notes (Signed)
Carelink Summary Report / Loop Recorder 

## 2018-11-21 ENCOUNTER — Ambulatory Visit: Payer: PPO | Admitting: Internal Medicine

## 2018-11-22 ENCOUNTER — Ambulatory Visit: Payer: PPO | Admitting: Physical Medicine & Rehabilitation

## 2018-12-06 ENCOUNTER — Encounter: Payer: Self-pay | Admitting: Physical Medicine & Rehabilitation

## 2018-12-06 ENCOUNTER — Encounter: Payer: PPO | Attending: Physical Medicine & Rehabilitation

## 2018-12-06 ENCOUNTER — Ambulatory Visit: Payer: PPO | Admitting: Physical Medicine & Rehabilitation

## 2018-12-06 VITALS — BP 137/84 | HR 74 | Ht 64.0 in | Wt 104.2 lb

## 2018-12-06 DIAGNOSIS — G8114 Spastic hemiplegia affecting left nondominant side: Secondary | ICD-10-CM | POA: Insufficient documentation

## 2018-12-06 DIAGNOSIS — E785 Hyperlipidemia, unspecified: Secondary | ICD-10-CM | POA: Diagnosis not present

## 2018-12-06 DIAGNOSIS — R269 Unspecified abnormalities of gait and mobility: Secondary | ICD-10-CM | POA: Insufficient documentation

## 2018-12-06 DIAGNOSIS — Z7902 Long term (current) use of antithrombotics/antiplatelets: Secondary | ICD-10-CM | POA: Insufficient documentation

## 2018-12-06 DIAGNOSIS — G8929 Other chronic pain: Secondary | ICD-10-CM

## 2018-12-06 DIAGNOSIS — I69354 Hemiplegia and hemiparesis following cerebral infarction affecting left non-dominant side: Secondary | ICD-10-CM | POA: Diagnosis not present

## 2018-12-06 DIAGNOSIS — R531 Weakness: Secondary | ICD-10-CM | POA: Diagnosis not present

## 2018-12-06 DIAGNOSIS — I10 Essential (primary) hypertension: Secondary | ICD-10-CM | POA: Diagnosis not present

## 2018-12-06 DIAGNOSIS — M069 Rheumatoid arthritis, unspecified: Secondary | ICD-10-CM | POA: Insufficient documentation

## 2018-12-06 DIAGNOSIS — Z7982 Long term (current) use of aspirin: Secondary | ICD-10-CM | POA: Insufficient documentation

## 2018-12-06 DIAGNOSIS — M25511 Pain in right shoulder: Secondary | ICD-10-CM

## 2018-12-06 DIAGNOSIS — M7989 Other specified soft tissue disorders: Secondary | ICD-10-CM | POA: Diagnosis not present

## 2018-12-06 DIAGNOSIS — K219 Gastro-esophageal reflux disease without esophagitis: Secondary | ICD-10-CM | POA: Diagnosis not present

## 2018-12-06 NOTE — Progress Notes (Signed)
Subjective:    Patient ID: Donna Rhodes, female    DOB: Jul 09, 1946, 72 y.o.   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  She fell while working in her garden about 3 months ago.  She was hoping it would get better but it has not.  She has pain mainly with movement.  She has not noted any swelling or bruising in that shoulder area.  She has no pain shooting into her fingers.  No neck pain.  In regards to her spastic hemiplegia patient underwent repeat botulinum toxin injection Botox 11/6 Biceps100 FCR25  FDS25 FDP50   Left pectoralis- 50 Left PT- 50  Patient is unsure whether she feels some benefits from the Botox.  We discussed that it is a medication that relaxes muscles but does not strengthen or cause her hemiplegic arm to become normal again. Pain Inventory Average Pain 0 Pain Right Now 0 My pain is intermittent and when hurts it is a 10  In the last 24 hours, has pain interfered with the following? General activity 0 Relation with others 0 Enjoyment of life 0 What TIME of day is your pain at its worst? varies Sleep (in general) Poor  Pain is worse with: some activites Pain improves with: heat/ice Relief from Meds: no pain med  Mobility walk without assistance  Function retired  Neuro/Psych No problems in this area  Prior Studies Any changes since last visit?  no  Physicians involved in your care Any changes since last visit?  no   Family History  Problem Relation Age of Onset  . Hypertension Father   . Dementia Father   . Osteoarthritis Mother   . Arthritis/Rheumatoid Sister   . Breast cancer Unknown        paternal side  . Breast cancer Paternal Aunt   . Breast cancer Cousin        maternal  . Uterine cancer Maternal Grandmother   . Ovarian cancer Neg Hx   . Heart disease Neg Hx   . Diabetes Neg Hx   . Colon cancer Neg Hx     Social History   Socioeconomic History  . Marital status: Divorced    Spouse name: Not on file  . Number of children: 3  . Years of education: 75  . Highest education level: Not on file  Occupational History    Comment: retired  Scientific laboratory technician  . Financial resource strain: Not on file  . Food insecurity:    Worry: Not on file    Inability: Not on file  . Transportation needs:    Medical: Not on file    Non-medical: Not on file  Tobacco Use  . Smoking status: Former Smoker    Last attempt to quit: 12/29/1979    Years since quitting: 38.9  . Smokeless tobacco: Never Used  Substance and Sexual Activity  . Alcohol use: No    Alcohol/week: 0.0 standard drinks  . Drug use: No  . Sexual activity: Not Currently    Birth control/protection: Surgical  Lifestyle  . Physical activity:    Days per week: Not on file    Minutes per session: Not on file  . Stress: Not on file  Relationships  . Social connections:    Talks on phone: Not on file    Gets together: Not on file    Attends religious service: Not on file  Active member of club or organization: Not on file    Attends meetings of clubs or organizations: Not on file    Relationship status: Not on file  Other Topics Concern  . Not on file  Social History Narrative   Her mother and her daughter live with her   Caffeine- 1 cup coffee   Past Surgical History:  Procedure Laterality Date  . ADENOIDECTOMY    . APPENDECTOMY  1965  . [place]   left  . CATARACT EXTRACTION W/PHACO Right 10/27/2017   Procedure: CATARACT EXTRACTION PHACO AND INTRAOCULAR LENS PLACEMENT ([place]) RIGHT;  Surgeon: Leandrew Koyanagi, MD;  Location: [facility];  Service: Ophthalmology;  Laterality: Right;  requests early (8-8:30 arrival)  . CATARACT EXTRACTION W/PHACO Left 11/10/2017   Procedure: CATARACT EXTRACTION PHACO AND INTRAOCULAR LENS PLACEMENT ([place]) LEFT;  Surgeon: Leandrew Koyanagi, MD;  Location: [facility];   Service: Ophthalmology;  Laterality: Left;  . CHOLECYSTECTOMY    . [place]  . laser vein surgery    . LOOP RECORDER INSERTION N/A 09/24/2017   Procedure: LOOP RECORDER INSERTION;  Surgeon: Evans Lance, MD;  Location: [facility];  Service: Cardiovascular;  Laterality: N/A;  . MANDIBLE RECONSTRUCTION  1992  . TOENAIL EXCISION  1998   several  . TONSILLECTOMY    . TUBAL LIGATION  1977  . VAGINAL HYSTERECTOMY  1983   als0- anterior/posterior colporrhaphy  . VAGINAL WOUND CLOSURE / REPAIR  1999, 2000  . VAGINAL WOUND CLOSURE / REPAIR     Past Medical History:  Diagnosis Date  . Atrophic vaginitis   . Dysplasia of vagina    vin 1 - vulva and vaginal cuff  . GERD (gastroesophageal reflux disease)   . HCV infection   . HSV (herpes simplex virus) infection   . Hypercholesterolemia   . Hypertension   . Hypothyroidism   . Left-sided weakness    S/P CVA 4/18.  PT 2x/wk.  . Lung nodule    stable  . Menopausal state   . Osteoarthritis    hand involvement, cervical disc disease  . Osteoporosis   . Pelvic pain in female   . Rheumatoid arthritis ([place])   . Stroke (cerebrum) ([place]) 04/24/2017  . Thyroid nodule    pt unaware of this   . Vaginal Pap smear, abnormal    lgsil   BP 137/84   Pulse 74   Ht 5\' 4"  (1.626 m) Comment: reported  Wt 104 lb 3.2 oz (47.3 kg)   LMP 12/29/1981   SpO2 97%   BMI 17.89 kg/m   Opioid Risk Score:   Fall Risk Score:  `1  Depression screen PHQ 2/9  Depression screen [facility] 2/9 12/06/2018 05/03/2018 02/25/2018 05/26/2017  Decreased Interest 0 0 0 0  Down, Depressed, Hopeless 0 0 0 0  PHQ - 2 Score 0 0 0 0  Some recent data might be hidden    Review of Systems  Constitutional: Negative.   HENT: Negative.   Eyes: Negative.   Respiratory: Negative.   Cardiovascular: Negative.   Gastrointestinal: Negative.   Endocrine: Negative.   Genitourinary: Negative.   Musculoskeletal: Positive for arthralgias.       Shoulder pain    Skin: Negative.   Allergic/Immunologic: Negative.   Neurological: Negative.   Hematological: Bruises/bleeds easily.  All other systems reviewed and are negative.      Objective:   Physical Exam  Constitutional: She is oriented to person, place, and time.  She appears well-developed and well-nourished. No distress.  HENT:  Head: Normocephalic and atraumatic.  Eyes: Pupils are equal, round, and reactive to light. EOM are normal.  Neck: Normal range of motion.  Neurological: She is alert and oriented to person, place, and time.  Skin: Skin is warm and dry. She is not diaphoretic.  Psychiatric: She has a normal mood and affect.  Nursing note and vitals reviewed. Tone MAS 2/3 left pectoralis MAS 1 at the left elbow flexors MAS 1 at the left pronators MAS 1 at the left wrist and finger flexors Motor strength is 2- at the left deltoid bicep finger flexors 0 at the finger extensors.  Pronator supinator to minus Left lower limb 4/5 and hip flexor knee extensor ankle dorsiflexor. Right shoulder has some tenderness over the acromioclavicular joint.  Negative impingement sign.  No pain with cervical spine range of motion.  Normal strength in the right upper extremity. Gait is stiff leg gait with the left side no evidence of toe drag or knee instability      Right ant shoulder pain, clicking with ROM at [facility] joint area    Assessment & Plan:   1 shoulder pain RIght AC joint vs rotator cuff this occurred after a fall approximately 3 months ago.  She has negative impingement signs and some clicking over the [facility] joint which suggests some AC joint arthropathy or subluxation.  Will check x-rays Follow-up in 2 months, depending on x-ray results may consider injections if not helpful may consider physical therapy as well left spastic hemiplegia we discussed the role of Botox to reduce her muscle tone.  Overall appears her tone is well controlled other than the pectoralis on the left side.  We discussed  that we will let the Botox completely wear off and reassess her tone.  I do think she will need some in the left pectoralis regardless but the other dosing may be adjusted based on her presentation at next visit in 2 months

## 2018-12-06 NOTE — Patient Instructions (Signed)
Try Boost or different flavor of Ensure, or Carnation instant breakfast

## 2018-12-12 ENCOUNTER — Ambulatory Visit
Admission: RE | Admit: 2018-12-12 | Discharge: 2018-12-12 | Disposition: A | Discharge status: Home / Self Care | Payer: PPO | Source: Ambulatory Visit | Attending: Physical Medicine & Rehabilitation | Admitting: Physical Medicine & Rehabilitation

## 2018-12-12 DIAGNOSIS — G8929 Other chronic pain: Secondary | ICD-10-CM

## 2018-12-12 DIAGNOSIS — M25511 Pain in right shoulder: Principal | ICD-10-CM

## 2018-12-15 ENCOUNTER — Telehealth: Payer: Self-pay

## 2018-12-15 ENCOUNTER — Telehealth: Payer: Self-pay | Admitting: Cardiology

## 2018-12-15 NOTE — Telephone Encounter (Signed)
Left message to call office

## 2018-12-15 NOTE — Telephone Encounter (Signed)
Manual transmission reviewed from today. ILR function is normal. Presenting rhythm is NSR at ~85bpm. Pause and brady detection are off. No symptom, tachy, or AF episodes recorded. HR histograms appropriate.  Routed to triage for further advisement.

## 2018-12-15 NOTE — Telephone Encounter (Signed)
Patient called requesting results of recent x-rays

## 2018-12-15 NOTE — Telephone Encounter (Signed)
No fracture.  There is some calcification of the rotator cuff tendon which may indicate a chronic tendinitis There is no significant arthritis.  We can try injecting the shoulder as well as some physical therapy

## 2018-12-15 NOTE — Telephone Encounter (Signed)
Patient called and stated that she has been having a deep heavy heart beat for the last 2 days. No shortness of breath, no chest pain, but she is a little swimmy headed / dizzy. Instructed pt to send a remote transmission w/ her home monitor even though we sent one on 12-14-18. Transmission received. Informed her that a RN will review and call her back.

## 2018-12-16 NOTE — Telephone Encounter (Signed)
Called patient and relayed information

## 2018-12-16 NOTE — Telephone Encounter (Signed)
SPOKE WITH PT AND PT  WAS WANTING TO KNOW FINDINGS OF LOOP RECORDING REVIEWED WITH PT (NORMAL FINDINGS AVERAGE HEART RATE 85) ALSO  PT NEEDED F/U APPT WITH DR Lovena Le  APPT  MADE FOR 03/28/19 AT 10:30 AM. PT TO CONTINUE TO MONITOR SYMPTOMS AND WILL CALL BACK IF NEEDED NO OTHER SYMPTOMS OTHER THAN "FEELING HEAVY" AND FATIGUE./CY

## 2018-12-19 ENCOUNTER — Ambulatory Visit (INDEPENDENT_AMBULATORY_CARE_PROVIDER_SITE_OTHER): Payer: PPO

## 2018-12-19 DIAGNOSIS — I639 Cerebral infarction, unspecified: Secondary | ICD-10-CM | POA: Diagnosis not present

## 2018-12-19 LAB — CUP PACEART REMOTE DEVICE CHECK
Date Time Interrogation Session: 20191222213639
Implantable Pulse Generator Implant Date: 20180928

## 2018-12-19 NOTE — Progress Notes (Signed)
Carelink Summary Report / Loop Recorder 

## 2019-01-05 DIAGNOSIS — Z79899 Other long term (current) drug therapy: Secondary | ICD-10-CM | POA: Diagnosis not present

## 2019-01-05 DIAGNOSIS — M0609 Rheumatoid arthritis without rheumatoid factor, multiple sites: Secondary | ICD-10-CM | POA: Diagnosis not present

## 2019-01-06 ENCOUNTER — Encounter

## 2019-01-08 LAB — CUP PACEART REMOTE DEVICE CHECK
Date Time Interrogation Session: 20191119211108
MDC IDC PG IMPLANT DT: 20180928

## 2019-01-12 DIAGNOSIS — Z79899 Other long term (current) drug therapy: Secondary | ICD-10-CM | POA: Diagnosis not present

## 2019-01-12 DIAGNOSIS — M0609 Rheumatoid arthritis without rheumatoid factor, multiple sites: Secondary | ICD-10-CM | POA: Diagnosis not present

## 2019-01-12 DIAGNOSIS — M150 Primary generalized (osteo)arthritis: Secondary | ICD-10-CM | POA: Diagnosis not present

## 2019-01-12 DIAGNOSIS — I6389 Other cerebral infarction: Secondary | ICD-10-CM | POA: Diagnosis not present

## 2019-01-16 ENCOUNTER — Encounter: Payer: PPO | Attending: Physical Medicine & Rehabilitation

## 2019-01-16 ENCOUNTER — Ambulatory Visit (HOSPITAL_BASED_OUTPATIENT_CLINIC_OR_DEPARTMENT_OTHER): Payer: PPO | Admitting: Physical Medicine & Rehabilitation

## 2019-01-16 ENCOUNTER — Encounter: Payer: Self-pay | Admitting: Physical Medicine & Rehabilitation

## 2019-01-16 VITALS — BP 127/73 | HR 71 | Resp 14 | Wt 106.0 lb

## 2019-01-16 DIAGNOSIS — R531 Weakness: Secondary | ICD-10-CM | POA: Insufficient documentation

## 2019-01-16 DIAGNOSIS — I69354 Hemiplegia and hemiparesis following cerebral infarction affecting left non-dominant side: Secondary | ICD-10-CM | POA: Insufficient documentation

## 2019-01-16 DIAGNOSIS — I10 Essential (primary) hypertension: Secondary | ICD-10-CM | POA: Diagnosis not present

## 2019-01-16 DIAGNOSIS — K219 Gastro-esophageal reflux disease without esophagitis: Secondary | ICD-10-CM | POA: Diagnosis not present

## 2019-01-16 DIAGNOSIS — E785 Hyperlipidemia, unspecified: Secondary | ICD-10-CM | POA: Insufficient documentation

## 2019-01-16 DIAGNOSIS — M7989 Other specified soft tissue disorders: Secondary | ICD-10-CM | POA: Insufficient documentation

## 2019-01-16 DIAGNOSIS — M069 Rheumatoid arthritis, unspecified: Secondary | ICD-10-CM | POA: Insufficient documentation

## 2019-01-16 DIAGNOSIS — G8114 Spastic hemiplegia affecting left nondominant side: Secondary | ICD-10-CM | POA: Insufficient documentation

## 2019-01-16 DIAGNOSIS — Z7982 Long term (current) use of aspirin: Secondary | ICD-10-CM | POA: Insufficient documentation

## 2019-01-16 DIAGNOSIS — G8929 Other chronic pain: Secondary | ICD-10-CM

## 2019-01-16 DIAGNOSIS — M25511 Pain in right shoulder: Secondary | ICD-10-CM

## 2019-01-16 DIAGNOSIS — M7541 Impingement syndrome of right shoulder: Secondary | ICD-10-CM

## 2019-01-16 DIAGNOSIS — R269 Unspecified abnormalities of gait and mobility: Secondary | ICD-10-CM | POA: Diagnosis not present

## 2019-01-16 DIAGNOSIS — Z7902 Long term (current) use of antithrombotics/antiplatelets: Secondary | ICD-10-CM | POA: Diagnosis not present

## 2019-01-16 NOTE — Progress Notes (Signed)
Shoulder injection Right    Indication:Right Shoulder pain not relieved by medication management and other conservative care.  Informed consent was obtained after describing risks and benefits of the procedure with the patient, this includes bleeding, bruising, infection and medication side effects. The patient wishes to proceed and has given written consent. Patient was placed in a seated position. TheRshoulder was marked and prepped with betadine in the subacromial area. A 25-gauge 1-1/2 inch needle was inserted into the subacromial area. After negative draw back for blood, a solution containing 1 mL of 6 mg per ML betamethasone and 4 mL of 1% lidocaine was injected. A band aid was applied. The patient tolerated the procedure well. Post procedure instructions were given.  Plan botox on or after 02/01/19 Biceps100 FCR25  FDS25 FDP50   Left pectoralis- 50 Left PT- 25 Left FPL 25

## 2019-01-16 NOTE — Patient Instructions (Signed)
Use resting hand splint at night  Botox next visit

## 2019-01-20 ENCOUNTER — Ambulatory Visit (INDEPENDENT_AMBULATORY_CARE_PROVIDER_SITE_OTHER): Payer: PPO

## 2019-01-20 DIAGNOSIS — I639 Cerebral infarction, unspecified: Secondary | ICD-10-CM

## 2019-01-21 LAB — CUP PACEART REMOTE DEVICE CHECK
Date Time Interrogation Session: 20200124213822
MDC IDC PG IMPLANT DT: 20180928

## 2019-01-23 NOTE — Progress Notes (Signed)
Carelink Summary Report / Loop Recorder 

## 2019-01-31 ENCOUNTER — Ambulatory Visit: Payer: PPO

## 2019-01-31 ENCOUNTER — Ambulatory Visit: Payer: PPO | Admitting: Physical Medicine & Rehabilitation

## 2019-02-07 ENCOUNTER — Ambulatory Visit: Payer: PPO | Admitting: Physical Medicine & Rehabilitation

## 2019-02-07 ENCOUNTER — Encounter: Payer: Self-pay | Admitting: Physical Medicine & Rehabilitation

## 2019-02-07 ENCOUNTER — Encounter: Payer: PPO | Attending: Physical Medicine & Rehabilitation

## 2019-02-07 VITALS — BP 122/81 | HR 71 | Ht 64.0 in | Wt 108.0 lb

## 2019-02-07 DIAGNOSIS — Z7902 Long term (current) use of antithrombotics/antiplatelets: Secondary | ICD-10-CM | POA: Diagnosis not present

## 2019-02-07 DIAGNOSIS — K219 Gastro-esophageal reflux disease without esophagitis: Secondary | ICD-10-CM | POA: Insufficient documentation

## 2019-02-07 DIAGNOSIS — E785 Hyperlipidemia, unspecified: Secondary | ICD-10-CM | POA: Insufficient documentation

## 2019-02-07 DIAGNOSIS — Z7982 Long term (current) use of aspirin: Secondary | ICD-10-CM | POA: Diagnosis not present

## 2019-02-07 DIAGNOSIS — R269 Unspecified abnormalities of gait and mobility: Secondary | ICD-10-CM | POA: Diagnosis not present

## 2019-02-07 DIAGNOSIS — I69354 Hemiplegia and hemiparesis following cerebral infarction affecting left non-dominant side: Secondary | ICD-10-CM | POA: Insufficient documentation

## 2019-02-07 DIAGNOSIS — M7989 Other specified soft tissue disorders: Secondary | ICD-10-CM | POA: Diagnosis not present

## 2019-02-07 DIAGNOSIS — I10 Essential (primary) hypertension: Secondary | ICD-10-CM | POA: Insufficient documentation

## 2019-02-07 DIAGNOSIS — R531 Weakness: Secondary | ICD-10-CM | POA: Diagnosis not present

## 2019-02-07 DIAGNOSIS — M069 Rheumatoid arthritis, unspecified: Secondary | ICD-10-CM | POA: Diagnosis not present

## 2019-02-07 DIAGNOSIS — G8114 Spastic hemiplegia affecting left nondominant side: Secondary | ICD-10-CM | POA: Diagnosis not present

## 2019-02-07 NOTE — Patient Instructions (Signed)
Aspercreme to shoulder 3 times a day

## 2019-02-07 NOTE — Progress Notes (Signed)
Botox Injection for spasticity using needle EMG guidance  Dilution: 50 Units/ml Indication: Severe spasticity which interferes with ADL,mobility and/or  hygiene and is unresponsive to medication management and other conservative care Informed consent was obtained after describing risks and benefits of the procedure with the patient. This includes bleeding, bruising, infection, excessive weakness, or medication side effects. A REMS form is on file and signed. Needle: 27g 1" needle electrode Number of units per muscle Biceps100 2+ FCR25 1+  FDS25 1+ FDP50 1+  FPL 25 2+ PT25 1+ PQ25 1+  Pec 25 inc insertional activity    All injections were done after obtaining appropriate EMG activity and after negative drawback for blood. The patient tolerated the procedure well. Post procedure instructions were given. A followup appointment was made.

## 2019-02-20 ENCOUNTER — Other Ambulatory Visit: Payer: Self-pay | Admitting: *Deleted

## 2019-02-20 MED ORDER — METRONIDAZOLE 500 MG PO TABS: 500.0000 mg | ORAL_TABLET | Freq: Once | ORAL | 1 refills | Status: AC

## 2019-02-22 ENCOUNTER — Ambulatory Visit (INDEPENDENT_AMBULATORY_CARE_PROVIDER_SITE_OTHER): Payer: PPO | Admitting: *Deleted

## 2019-02-22 DIAGNOSIS — I639 Cerebral infarction, unspecified: Secondary | ICD-10-CM | POA: Diagnosis not present

## 2019-02-23 LAB — CUP PACEART REMOTE DEVICE CHECK
Implantable Pulse Generator Implant Date: 20180928
MDC IDC SESS DTM: 20200226214042

## 2019-02-28 ENCOUNTER — Ambulatory Visit (INDEPENDENT_AMBULATORY_CARE_PROVIDER_SITE_OTHER): Payer: PPO | Admitting: Internal Medicine

## 2019-02-28 ENCOUNTER — Encounter: Payer: Self-pay | Admitting: Internal Medicine

## 2019-02-28 DIAGNOSIS — E7800 Pure hypercholesterolemia, unspecified: Secondary | ICD-10-CM | POA: Diagnosis not present

## 2019-02-28 DIAGNOSIS — I10 Essential (primary) hypertension: Secondary | ICD-10-CM | POA: Diagnosis not present

## 2019-02-28 DIAGNOSIS — Z8673 Personal history of transient ischemic attack (TIA), and cerebral infarction without residual deficits: Secondary | ICD-10-CM | POA: Diagnosis not present

## 2019-02-28 DIAGNOSIS — E039 Hypothyroidism, unspecified: Secondary | ICD-10-CM | POA: Diagnosis not present

## 2019-02-28 DIAGNOSIS — F320 Major depressive disorder, single episode, mild: Secondary | ICD-10-CM

## 2019-02-28 DIAGNOSIS — M0600 Rheumatoid arthritis without rheumatoid factor, unspecified site: Secondary | ICD-10-CM | POA: Diagnosis not present

## 2019-02-28 DIAGNOSIS — K219 Gastro-esophageal reflux disease without esophagitis: Secondary | ICD-10-CM

## 2019-02-28 DIAGNOSIS — R269 Unspecified abnormalities of gait and mobility: Secondary | ICD-10-CM | POA: Diagnosis not present

## 2019-02-28 DIAGNOSIS — I69398 Other sequelae of cerebral infarction: Secondary | ICD-10-CM

## 2019-02-28 DIAGNOSIS — F32A Depression, unspecified: Secondary | ICD-10-CM

## 2019-02-28 NOTE — Progress Notes (Signed)
Patient ID: Donna Rhodes, female   DOB: February 02, 1946, 73 y.o.   MRN: [id number]   Subjective:    Patient ID: Donna Rhodes, female    DOB: Apr 08, 1946, 73 y.o.   MRN: [id number]  HPI  Patient here for a scheduled follow up.  Has been receiving botox injections into her left shoulder/arm.  States after the last injection, she does not feel she can grip as well.  Has f/u planned.  Sees Dr Jefm Bryant.  He had instructed her to decrease her MTX. She reports increased frustration with not being able to get out and do things she used to do.  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  Tries to stay active.  No chest pain.  No sob.  No acid reflux.  No abdominal pain.  Bowels moving.  No urine change.  Her gait is still affected.  Does not feel steady.  Also describes some lower extremity weakness - trying to stand and walk.  Some soreness to her left lateral leg.  Feels muscle is tight.  Feels therapy would help.  Discussed f/u therapy.  She would like to be referred back to therapy.  She had her bathroom equipped for handicap accessibility.  This has helped with some of her ADLs.     Past Medical History:  Diagnosis Date  . Atrophic vaginitis   . Dysplasia of vagina    vin 1 - vulva and vaginal cuff  . GERD (gastroesophageal reflux disease)   . HCV infection   . HSV (herpes simplex virus) infection   . Hypercholesterolemia   . Hypertension   . Hypothyroidism   . Left-sided weakness    S/P CVA 4/18.  PT 2x/wk.  . Lung nodule    stable  . Menopausal state   . Osteoarthritis    hand involvement, cervical disc disease  . Osteoporosis   . Pelvic pain in female   . Rheumatoid arthritis ([place])   . 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 Thyroid nodule    pt unaware of this   . Vaginal Pap smear, abnormal    lgsil   Past Surgical History:  Procedure Laterality Date  . ADENOIDECTOMY    . APPENDECTOMY  1965  . [place]   left  . CATARACT EXTRACTION W/PHACO Right 10/27/2017   Procedure: CATARACT EXTRACTION PHACO AND INTRAOCULAR LENS PLACEMENT ([place]) RIGHT;  Surgeon: Leandrew Koyanagi, MD;  Location: [facility];  Service: Ophthalmology;  Laterality: Right;  requests early (8-8:30 arrival)  . CATARACT EXTRACTION W/PHACO Left 11/10/2017   Procedure: CATARACT EXTRACTION PHACO AND INTRAOCULAR LENS PLACEMENT ([place]) LEFT;  Surgeon: Leandrew Koyanagi, MD;  Location: [facility];  Service: Ophthalmology;  Laterality: Left;  . CHOLECYSTECTOMY    . [place]  . laser vein surgery    . LOOP RECORDER INSERTION N/A 09/24/2017   Procedure: LOOP RECORDER INSERTION;  Surgeon: Evans Lance, MD;  Location: [facility];  Service: Cardiovascular;  Laterality: N/A;  . MANDIBLE RECONSTRUCTION  1992  . TOENAIL EXCISION  1998   several  . TONSILLECTOMY    . TUBAL LIGATION  1977  . VAGINAL HYSTERECTOMY  1983   als0- anterior/posterior colporrhaphy  . VAGINAL WOUND CLOSURE / REPAIR  1999, 2000  . VAGINAL WOUND CLOSURE / REPAIR     Family History  Problem Relation Age of Onset  . Hypertension Father   . Dementia Father   . Osteoarthritis Mother   . Arthritis/Rheumatoid  Sister   . Breast cancer Other        paternal side  . Breast cancer Paternal Aunt   . Breast cancer Cousin        maternal  . Uterine cancer Maternal Grandmother   . Ovarian cancer Neg Hx   . Heart disease Neg Hx   . Diabetes Neg Hx   . Colon cancer Neg Hx    Social History   Socioeconomic History  . Marital status: Divorced    Spouse name: Not on file  . Number of children: 3  . Years of education: 26  . Highest education level: Not on file  Occupational History    Comment: retired  Scientific laboratory technician  . Financial resource strain: Not on file  . Food insecurity:    Worry: Not on file    Inability: Not on file  . Transportation needs:    Medical: Not on file    Non-medical: Not on file  Tobacco Use  . Smoking status: Former Smoker    Last attempt to quit:  12/29/1979    Years since quitting: 39.2  . Smokeless tobacco: Never Used  Substance and Sexual Activity  . Alcohol use: No    Alcohol/week: 0.0 standard drinks  . Drug use: No  . Sexual activity: Not Currently    Birth control/protection: Surgical  Lifestyle  . Physical activity:    Days per week: Not on file    Minutes per session: Not on file  . Stress: Not on file  Relationships  . Social connections:    Talks on phone: Not on file    Gets together: Not on file    Attends religious service: Not on file    Active member of club or organization: Not on file    Attends meetings of clubs or organizations: Not on file    Relationship status: Not on file  Other Topics Concern  . Not on file  Social History Narrative   Her mother and her daughter live with her   Caffeine- 1 cup coffee    Outpatient Encounter Medications as of 02/28/2019  Medication Sig  . acetaminophen (TYLENOL) 500 MG tablet Take 500-1,000 mg by mouth every 6 (six) hours as needed for mild pain.  Marland Kitchen acyclovir (ZOVIRAX) 200 MG capsule TAKE ONE CAPSULE BY MOUTH TWICE DAILY  . amLODipine (NORVASC) 2.5 MG tablet Take 1 tablet (2.5 mg total) by mouth daily.  Marland Kitchen atorvastatin (LIPITOR) 20 MG tablet TAKE 1 TABLET BY MOUTH ONCE DAILY  . calcium-vitamin D (OSCAL WITH D) 250-125 MG-UNIT tablet Take 1 tablet by mouth daily.  . citalopram (CELEXA) 20 MG tablet Take 1 tablet (20 mg total) by mouth daily.  . clopidogrel (PLAVIX) 75 MG tablet Take 1 tablet (75 mg total) by mouth daily.  . folic acid (FOLVITE) 1 MG tablet Take 1 mg by mouth daily.  Marland Kitchen gabapentin (NEURONTIN) 100 MG capsule Take 1 capsule by mouth 4 (four) times daily.  Marland Kitchen levothyroxine (SYNTHROID, LEVOTHROID) 50 MCG tablet TAKE 1 TABLET BY MOUTH ONCE DAILY BEFORE BREAKFAST  . methotrexate 50 MG/2ML injection Inject 0.49mL under the skin once a weekly on Wednesday  . Multiple Vitamin (MULTIVITAMIN WITH MINERALS) TABS tablet Take 1 tablet by mouth daily.  Marland Kitchen nystatin  (MYCOSTATIN) 100000 UNIT/ML suspension Take 5 mLs (500,000 Units total) by mouth 3 (three) times daily as needed.  . pantoprazole (PROTONIX) 20 MG tablet Take 1 tablet (20 mg total) by mouth daily.   No facility-administered encounter  medications on file as of 02/28/2019.     Review of Systems  Constitutional: Negative for appetite change and unexpected weight change.  HENT: Negative for congestion and sinus pressure.   Respiratory: Negative for cough, chest tightness and shortness of breath.   Cardiovascular: Negative for chest pain, palpitations and leg swelling.  Gastrointestinal: Negative for abdominal pain, diarrhea, nausea and vomiting.  Genitourinary: Negative for difficulty urinating and dysuria.  Musculoskeletal: Negative for joint swelling and myalgias.       Left lateral leg pain.  Some motor weakness as outlined.  Unsteady gait.    Skin: Negative for color change and rash.  Neurological: Negative for dizziness, light-headedness and headaches.  Psychiatric/Behavioral: Negative for agitation.       Increased frustration as outlined.         Objective:    Physical Exam Constitutional:      General: She is not in acute distress.    Appearance: Normal appearance.  HENT:     Nose: Nose normal. No congestion.     Mouth/Throat:     Pharynx: No oropharyngeal exudate or posterior oropharyngeal erythema.  Neck:     Musculoskeletal: Neck supple. No muscular tenderness.     Thyroid: No thyromegaly.  Cardiovascular:     Rate and Rhythm: Normal rate and regular rhythm.  Pulmonary:     Effort: No respiratory distress.     Breath sounds: Normal breath sounds. No wheezing.  Abdominal:     General: Bowel sounds are normal.     Palpations: Abdomen is soft.     Tenderness: There is no abdominal tenderness.  Musculoskeletal:        General: No swelling or tenderness.  Lymphadenopathy:     Cervical: No cervical adenopathy.  Skin:    Findings: No erythema or rash.  Neurological:       Mental Status: She is alert.  Psychiatric:        Mood and Affect: Mood normal.        Behavior: Behavior normal.     BP 112/70   Pulse 88   Temp 98 F (36.7 C) (Oral)   Resp 16   Wt 107 lb (48.5 kg)   LMP 12/29/1981   SpO2 97%   BMI 18.37 kg/m  Wt Readings from Last 3 Encounters:  02/28/19 107 lb (48.5 kg)  02/07/19 108 lb (49 kg)  01/16/19 106 lb (48.1 kg)     Lab Results  Component Value Date   WBC 5.6 09/20/2018   HGB 12.3 09/20/2018   HCT 37.0 09/20/2018   PLT 229.0 09/20/2018   GLUCOSE 79 09/27/2018   CHOL 143 09/27/2018   TRIG 81.0 09/27/2018   HDL 57.60 09/27/2018   LDLDIRECT 128.0 05/25/2013   LDLCALC 69 09/27/2018   ALT 16 09/27/2018   AST 22 09/27/2018   NA 141 09/27/2018   K 4.0 09/27/2018   CL 106 09/27/2018   CREATININE 0.74 09/27/2018   BUN 19 09/27/2018   CO2 29 09/27/2018   TSH 2.06 09/27/2018   HGBA1C 5.7 (H) 04/25/2017    Dg Shoulder Right  Result Date: 12/12/2018 CLINICAL DATA:  Right shoulder pain since fall approximately 6 months ago. EXAM: RIGHT SHOULDER - 2+ VIEW COMPARISON:  Chest radiograph-05/26/2017. FINDINGS: No acute fracture or dislocation. Punctate (3 mm) ossicle overlies the left greater tuberosity, likely the sequela of remote avulsive injury. Glenohumeral acromioclavicular joint spaces appear preserved. No evidence of calcific tendinitis. Limited visualization of the adjacent thorax is normal. Regional soft  tissues appear normal. IMPRESSION: 1. Punctate (3 mm) ossicle adjacent to greater tuberosity likely represents sequela of remote avulsive injury. 2. Otherwise, no explanation for patient's chronic right shoulder pain. Electronically Signed   By: Sandi Mariscal M.D.   On: 12/12/2018 13:44       Assessment & Plan:   Problem List Items Addressed This Visit    Benign essential HTN    Blood pressure under good control.  Continue same medication regimen.  Follow pressures.  Follow metabolic panel.        Relevant Orders    Basic metabolic panel   Gait disturbance, post-stroke    Persistent.  Some left lateral leg pain.  She feels the muscle is tight.  Does not feel her muscles are as strong.  Unsteady gait. Request referral to physical therapy.        Relevant Orders   Ambulatory referral to Physical Therapy   GERD (gastroesophageal reflux disease)    Controlled.       History of CVA (cerebrovascular accident)    Resulting left hemiparesis of upper extremity.  Unsteady gait.  On plavix. Refer back to therapy as outlined.        Relevant Orders   Ambulatory referral to Physical Therapy   Hypercholesterolemia    On lipitor.  Follow lipid panel and liver function tests.        Relevant Orders   Hepatic function panel   Lipid panel   Hypothyroidism, unspecified    On thyroid replacement.  Follow tsh.       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  Discussed with her today.  She does not feel she needs any further intervention.  Follow.       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)    Followed by Dr Jefm Bryant. On MTX. Stable.           Einar Pheasant, MD

## 2019-03-01 NOTE — Progress Notes (Signed)
Carelink Summary Report / Loop Recorder 

## 2019-03-04 ENCOUNTER — Encounter: Payer: Self-pay | Admitting: Internal Medicine

## 2019-03-04 NOTE — Assessment & Plan Note (Signed)
Blood pressure under good control.  Continue same medication regimen.  Follow pressures.  Follow metabolic panel.   

## 2019-03-04 NOTE — Assessment & Plan Note (Signed)
On lipitor.  Follow lipid panel and liver function tests.   

## 2019-03-04 NOTE — Assessment & Plan Note (Signed)
On thyroid replacement.  Follow tsh.  

## 2019-03-04 NOTE — Assessment & Plan Note (Signed)
Resulting left hemiparesis of upper extremity.  Unsteady gait.  On plavix. Refer back to therapy as outlined.

## 2019-03-04 NOTE — Assessment & Plan Note (Signed)
Followed by Dr Kernodle.  On MTX.  Stable.  

## 2019-03-04 NOTE — Assessment & Plan Note (Signed)
On citalopram.  Discussed with her today.  She does not feel she needs any further intervention.  Follow.

## 2019-03-04 NOTE — Assessment & Plan Note (Signed)
Controlled.  

## 2019-03-04 NOTE — Assessment & Plan Note (Signed)
Persistent.  Some left lateral leg pain.  She feels the muscle is tight.  Does not feel her muscles are as strong.  Unsteady gait. Request referral to physical therapy.

## 2019-03-06 ENCOUNTER — Other Ambulatory Visit (INDEPENDENT_AMBULATORY_CARE_PROVIDER_SITE_OTHER): Payer: PPO

## 2019-03-06 DIAGNOSIS — I10 Essential (primary) hypertension: Secondary | ICD-10-CM | POA: Diagnosis not present

## 2019-03-06 DIAGNOSIS — E7800 Pure hypercholesterolemia, unspecified: Secondary | ICD-10-CM

## 2019-03-06 LAB — HEPATIC FUNCTION PANEL
ALT: 18 U/L (ref 0–35)
AST: 23 U/L (ref 0–37)
Albumin: 4.2 g/dL (ref 3.5–5.2)
Alkaline Phosphatase: 84 U/L (ref 39–117)
BILIRUBIN TOTAL: 0.7 mg/dL (ref 0.2–1.2)
Bilirubin, Direct: 0.1 mg/dL (ref 0.0–0.3)
Total Protein: 7 g/dL (ref 6.0–8.3)

## 2019-03-06 LAB — LIPID PANEL
Cholesterol: 173 mg/dL (ref 0–200)
HDL: 69.3 mg/dL (ref >39.00)
LDL Cholesterol: 89 mg/dL (ref 0–99)
NONHDL: 103.87
Total CHOL/HDL Ratio: 2
Triglycerides: 75 mg/dL (ref 0.0–149.0)
VLDL: 15 mg/dL (ref 0.0–40.0)

## 2019-03-06 LAB — BASIC METABOLIC PANEL
BUN: 16 mg/dL (ref 6–23)
CO2: 28 mEq/L (ref 19–32)
Calcium: 9.5 mg/dL (ref 8.4–10.5)
Chloride: 106 mEq/L (ref 96–112)
Creatinine, Ser: 0.74 mg/dL (ref 0.40–1.20)
GFR: 76.86 mL/min (ref >60.00)
GLUCOSE: 80 mg/dL (ref 70–99)
Potassium: 4.2 mEq/L (ref 3.5–5.1)
Sodium: 141 mEq/L (ref 135–145)

## 2019-03-07 ENCOUNTER — Telehealth: Payer: Self-pay | Admitting: Radiology

## 2019-03-07 NOTE — Telephone Encounter (Signed)
Pt coming in for labs tomorrow, please place future orders. Thank you.  

## 2019-03-07 NOTE — Telephone Encounter (Signed)
Called pt to notify pt she will not lab appt for tomorrow. Pt verbalized understanding.

## 2019-03-07 NOTE — Telephone Encounter (Signed)
She just had labs draw yesterday and they were ok.  Please notify she should not need labs tomorrow.  Thanks

## 2019-03-08 ENCOUNTER — Other Ambulatory Visit: Payer: PPO

## 2019-03-16 ENCOUNTER — Telehealth: Payer: Self-pay | Admitting: Internal Medicine

## 2019-03-17 ENCOUNTER — Encounter: Payer: Self-pay | Admitting: Internal Medicine

## 2019-03-21 ENCOUNTER — Ambulatory Visit: Payer: PPO | Admitting: Physical Medicine & Rehabilitation

## 2019-03-24 ENCOUNTER — Telehealth: Payer: Self-pay | Admitting: Internal Medicine

## 2019-03-24 NOTE — Telephone Encounter (Signed)
New message    Left message with pt family to have pt call back to discuss visit with Dr.Taylor on 03.31.20.

## 2019-03-27 ENCOUNTER — Ambulatory Visit (INDEPENDENT_AMBULATORY_CARE_PROVIDER_SITE_OTHER): Payer: PPO | Admitting: *Deleted

## 2019-03-27 ENCOUNTER — Other Ambulatory Visit: Payer: Self-pay

## 2019-03-27 DIAGNOSIS — I639 Cerebral infarction, unspecified: Secondary | ICD-10-CM

## 2019-03-27 DIAGNOSIS — R002 Palpitations: Secondary | ICD-10-CM

## 2019-03-27 NOTE — Telephone Encounter (Signed)
Call returned to Pt.  Provided education on loop and why it's in.  Explained why she didn't receive monthly calls with updates.  Pt adamant she wants loop out as soon as possible.  Advised Pt would reschedule her OV as soon as we are seeing patient's again.  Pt indicates understanding.

## 2019-03-27 NOTE — Telephone Encounter (Signed)
New Message:   Patient calling concerning her pac maker. Patient also request that she would like for it to be taken out.

## 2019-03-28 ENCOUNTER — Ambulatory Visit: Payer: PPO | Admitting: Internal Medicine

## 2019-03-28 ENCOUNTER — Telehealth: Payer: PPO | Admitting: Internal Medicine

## 2019-03-28 LAB — CUP PACEART REMOTE DEVICE CHECK
Date Time Interrogation Session: 20200330221037
MDC IDC PG IMPLANT DT: 20180928

## 2019-04-05 NOTE — Progress Notes (Signed)
Carelink Summary Report / Loop Recorder 

## 2019-04-11 DIAGNOSIS — F1120 Opioid dependence, uncomplicated: Secondary | ICD-10-CM | POA: Diagnosis not present

## 2019-04-11 DIAGNOSIS — Z85828 Personal history of other malignant neoplasm of skin: Secondary | ICD-10-CM

## 2019-04-11 DIAGNOSIS — H903 Sensorineural hearing loss, bilateral: Secondary | ICD-10-CM | POA: Diagnosis not present

## 2019-04-11 DIAGNOSIS — Z79899 Other long term (current) drug therapy: Secondary | ICD-10-CM | POA: Diagnosis not present

## 2019-04-11 DIAGNOSIS — H02885 Meibomian gland dysfunction left lower eyelid: Secondary | ICD-10-CM | POA: Diagnosis not present

## 2019-04-11 DIAGNOSIS — R410 Disorientation, unspecified: Secondary | ICD-10-CM | POA: Diagnosis not present

## 2019-04-11 DIAGNOSIS — C44111 Basal cell carcinoma of skin of unspecified eyelid, including canthus: Secondary | ICD-10-CM | POA: Diagnosis not present

## 2019-04-11 DIAGNOSIS — L820 Inflamed seborrheic keratosis: Secondary | ICD-10-CM | POA: Diagnosis not present

## 2019-04-11 DIAGNOSIS — L821 Other seborrheic keratosis: Secondary | ICD-10-CM | POA: Diagnosis not present

## 2019-04-11 DIAGNOSIS — L578 Other skin changes due to chronic exposure to nonionizing radiation: Secondary | ICD-10-CM | POA: Diagnosis not present

## 2019-04-11 DIAGNOSIS — D485 Neoplasm of uncertain behavior of skin: Secondary | ICD-10-CM | POA: Diagnosis not present

## 2019-04-11 DIAGNOSIS — R0902 Hypoxemia: Secondary | ICD-10-CM | POA: Diagnosis not present

## 2019-04-11 HISTORY — DX: Personal history of other malignant neoplasm of skin: Z85.828

## 2019-04-14 ENCOUNTER — Other Ambulatory Visit: Payer: Self-pay

## 2019-04-14 MED ORDER — AMLODIPINE BESYLATE 2.5 MG PO TABS: 2.5000 mg | ORAL_TABLET | Freq: Every day | ORAL | 1 refills | Status: DC

## 2019-04-14 NOTE — Telephone Encounter (Signed)
Copied from [place] [phone]. Topic: Quick Communication - Rx Refill/Question >> Apr 14, 2019 11:17 AM Blase Mess A wrote: Medication: amLODipine (NORVASC) 2.5 MG tablet [[id number]] requesting 60 pills or 90s   Has the patient contacted their pharmacy? Yes  (Agent: If no, request that the patient contact the pharmacy for the refill.) (Agent: If yes, when and what did the pharmacy advise?)  Preferred Pharmacy (with phone number or street name):   [facility] [address] [place] - [place] [phone] (Phone) [phone] (Fax)    Agent: Please be advised that RX refills may take up to 3 business days. We ask that you follow-up with your pharmacy.

## 2019-04-14 NOTE — Telephone Encounter (Signed)
rx changed to 90 day. Left message for pt

## 2019-04-14 NOTE — Telephone Encounter (Signed)
rx sent in 

## 2019-05-09 ENCOUNTER — Other Ambulatory Visit: Payer: Self-pay

## 2019-05-09 ENCOUNTER — Ambulatory Visit (INDEPENDENT_AMBULATORY_CARE_PROVIDER_SITE_OTHER): Payer: PPO | Admitting: *Deleted

## 2019-05-09 DIAGNOSIS — I639 Cerebral infarction, unspecified: Secondary | ICD-10-CM

## 2019-05-09 DIAGNOSIS — R002 Palpitations: Secondary | ICD-10-CM

## 2019-05-10 LAB — CUP PACEART REMOTE DEVICE CHECK
Date Time Interrogation Session: 20200512034112
Implantable Pulse Generator Implant Date: 20180928

## 2019-05-25 ENCOUNTER — Telehealth: Payer: Self-pay | Admitting: Internal Medicine

## 2019-05-25 NOTE — Telephone Encounter (Signed)
Unenrolled from [place], return kit ordered to home address on file.

## 2019-05-25 NOTE — Telephone Encounter (Signed)
Returned call to Pt.  Advised we still cannot remove her loop at this time.  Advised we will stop monthly monitoring.  Asked Pt to unplug her monitor.  Advised Pt hopefully in July we can get her in for loop removal.  Pt thanked nurse for call back.  Will schedule for in office loop removal as soon as possible.

## 2019-05-25 NOTE — Telephone Encounter (Signed)
New Message:     Please call, pt says she wants her Loop Recorder removed.

## 2019-05-26 NOTE — Progress Notes (Signed)
Carelink Summary Report / Loop Recorder 

## 2019-05-29 DIAGNOSIS — L820 Inflamed seborrheic keratosis: Secondary | ICD-10-CM | POA: Diagnosis not present

## 2019-05-29 DIAGNOSIS — L99 Other disorders of skin and subcutaneous tissue in diseases classified elsewhere: Secondary | ICD-10-CM | POA: Diagnosis not present

## 2019-05-29 DIAGNOSIS — C441122 Basal cell carcinoma of skin of right lower eyelid, including canthus: Secondary | ICD-10-CM | POA: Diagnosis not present

## 2019-06-09 ENCOUNTER — Other Ambulatory Visit: Payer: Self-pay | Admitting: Internal Medicine

## 2019-07-04 ENCOUNTER — Encounter: Payer: PPO | Admitting: Internal Medicine

## 2019-07-10 ENCOUNTER — Telehealth: Payer: Self-pay | Admitting: Internal Medicine

## 2019-07-10 NOTE — Telephone Encounter (Signed)

## 2019-07-11 ENCOUNTER — Encounter: Payer: Self-pay | Admitting: Internal Medicine

## 2019-07-11 ENCOUNTER — Other Ambulatory Visit: Payer: Self-pay

## 2019-07-11 ENCOUNTER — Ambulatory Visit (INDEPENDENT_AMBULATORY_CARE_PROVIDER_SITE_OTHER): Payer: PPO | Admitting: Internal Medicine

## 2019-07-11 VITALS — BP 122/74 | HR 77 | Ht 64.0 in | Wt 106.2 lb

## 2019-07-11 DIAGNOSIS — Z8673 Personal history of transient ischemic attack (TIA), and cerebral infarction without residual deficits: Secondary | ICD-10-CM | POA: Diagnosis not present

## 2019-07-11 DIAGNOSIS — I639 Cerebral infarction, unspecified: Secondary | ICD-10-CM

## 2019-07-11 NOTE — Patient Instructions (Addendum)
Medication Instructions:  Your physician recommends that you continue on your current medications as directed. Please refer to the Current Medication list given to you today.  Labwork: None ordered.  Testing/Procedures: None ordered.  Follow-Up: Your physician wants you to follow-up in:  for a virtual wound check with device clinic.  Your physician wants you to follow-up in: as needed with Dr. Lovena Le.      Implantable Loop Recorder Placement, Care After Refer to this sheet in the next few weeks. These instructions provide you with information about caring for yourself after your procedure. Your health care provider may also give you more specific instructions. Your treatment has been planned according to current medical practices, but problems sometimes occur. Call your health care provider if you have any problems or questions after your procedure. What can I expect after the procedure? After the procedure, it is common to have:  Soreness or pain near the cut from surgery (incision).  Some swelling or bruising near the incision.  Follow these instructions at home:  Medicines  Take over-the-counter and prescription medicines only as told by your health care provider.  If you were prescribed an antibiotic medicine, take it as told by your health care provider. Do not stop taking the antibiotic even if you start to feel better.  Bathing Do not take baths, swim, or use a hot tub until your health care provider approves. You may shower 3 days after removal of your device.  Incision care  Follow instructions from your health care provider about how to take care of your incision. Make sure you: ? Remove your top dressing after 24 hours (before you shower) ? Leave stitches (sutures), skin glue, or adhesive strips in place. These skin closures may need to stay in place for 2 weeks or longer. If adhesive strip edges start to loosen and curl up, you may trim the loose edges. Do not  remove adhesive strips completely unless your health care provider tells you to do that.  Check your incision area every day for signs of infection. Check for: ? More redness, swelling, or pain. ? Fluid or blood. ? Warmth. ? Pus or a bad smell.  Contact a health care provider if:  You have more redness, swelling, or pain around your incision.  You have more fluid or blood coming from your incision.  Your incision feels warm to the touch.  You have pus or a bad smell coming from your incision.  You have a fever.  You have pain that is not relieved by your pain medicine.  You have triggered your device because of fainting (syncope) or because of a heartbeat that feels like it is racing, slow, fluttering, or skipping (palpitations).

## 2019-07-11 NOTE — Progress Notes (Signed)
HPI Donna Rhodes presents today for ILR removal. She has a h/o cryptogenic stroke and underwent ILR insertion approx 3 years ago. She has not had atrial fib. She is here today for device removal. Allergies  Allergen Reactions  . Amoxicillin Other (See Comments)    Has patient had a PCN reaction causing immediate rash, facial/tongue/throat swelling, SOB or lightheadedness with hypotension: No Has patient had a PCN reaction causing severe rash involving mucus membranes or skin necrosis: No Has patient had a PCN reaction that required hospitalization: No Has patient had a PCN reaction occurring within the last 10 years: No If all of the above answers are "NO", then may proceed with Cephalosporin use.   Irritated tongue  . Avelox [Moxifloxacin Hcl In Nacl]     Tongue irritation  . Azithromycin Other (See Comments)    Caused mouth and tongue to be sore  . Codeine     "feels like I'm in a barrel"  . [place] [Nutritional Supplements]     Mouth soreness  . Food     Walnuts --mouth breaks out  . Moxifloxacin Other (See Comments)    Tongue irritation  . Naproxen     Tongue irritation  . Nsaids Other (See Comments)    Oral inflammation  . Cefepime Rash     Current Outpatient Medications  Medication Sig Dispense Refill  . acetaminophen (TYLENOL) 500 MG tablet Take 500-1,000 mg by mouth every 6 (six) hours as needed for mild pain.    Marland Kitchen acyclovir (ZOVIRAX) 200 MG capsule TAKE ONE CAPSULE BY MOUTH TWICE DAILY 180 capsule 3  . amLODipine (NORVASC) 2.5 MG tablet Take 1 tablet (2.5 mg total) by mouth daily. 90 tablet 1  . atorvastatin (LIPITOR) 20 MG tablet TAKE 1 TABLET BY MOUTH ONCE DAILY 90 tablet 1  . calcium-vitamin D (OSCAL WITH D) 250-125 MG-UNIT tablet Take 1 tablet by mouth daily.    . citalopram (CELEXA) 20 MG tablet Take 1 tablet (20 mg total) by mouth daily. 30 tablet 2  . clopidogrel (PLAVIX) 75 MG tablet Take 1 tablet by mouth once daily 90 tablet 0   . folic acid (FOLVITE) 1 MG tablet Take 1 mg by mouth daily.    Marland Kitchen levothyroxine (SYNTHROID, LEVOTHROID) 50 MCG tablet TAKE 1 TABLET BY MOUTH ONCE DAILY BEFORE BREAKFAST 90 tablet 0  . methotrexate 50 MG/2ML injection Inject 0.83mL under the skin once a weekly on Wednesday    . Multiple Vitamin (MULTIVITAMIN WITH MINERALS) TABS tablet Take 1 tablet by mouth daily.    . pantoprazole (PROTONIX) 20 MG tablet Take 1 tablet (20 mg total) by mouth daily. 90 tablet 1   No current facility-administered medications for this visit.      Past Medical History:  Diagnosis Date  . Atrophic vaginitis   . Dysplasia of vagina    vin 1 - vulva and vaginal cuff  . GERD (gastroesophageal reflux disease)   . HCV infection   . HSV (herpes simplex virus) infection   . Hypercholesterolemia   . Hypertension   . Hypothyroidism   . Left-sided weakness    S/P CVA 4/18.  PT 2x/wk.  . Lung nodule    stable  . Menopausal state   . Osteoarthritis    hand involvement, cervical disc disease  . Osteoporosis   . Pelvic pain in female   . Rheumatoid arthritis ([place])   . Stroke (cerebrum) ([place]) 04/24/2017  . Thyroid nodule    pt  unaware of this   . Vaginal Pap smear, abnormal    lgsil    ROS:   All systems reviewed and negative except as noted in the HPI.   Past Surgical History:  Procedure Laterality Date  . ADENOIDECTOMY    . APPENDECTOMY  1965  . [place]   left  . CATARACT EXTRACTION W/PHACO Right 10/27/2017   Procedure: CATARACT EXTRACTION PHACO AND INTRAOCULAR LENS PLACEMENT ([place]) RIGHT;  Surgeon: Leandrew Koyanagi, MD;  Location: [facility];  Service: Ophthalmology;  Laterality: Right;  requests early (8-8:30 arrival)  . CATARACT EXTRACTION W/PHACO Left 11/10/2017   Procedure: CATARACT EXTRACTION PHACO AND INTRAOCULAR LENS PLACEMENT ([place]) LEFT;  Surgeon: Leandrew Koyanagi, MD;  Location: [facility];  Service: Ophthalmology;  Laterality: Left;  . CHOLECYSTECTOMY     . [place]  . laser vein surgery    . LOOP RECORDER INSERTION N/A 09/24/2017   Procedure: LOOP RECORDER INSERTION;  Surgeon: Evans Lance, MD;  Location: [facility];  Service: Cardiovascular;  Laterality: N/A;  . MANDIBLE RECONSTRUCTION  1992  . TOENAIL EXCISION  1998   several  . TONSILLECTOMY    . TUBAL LIGATION  1977  . VAGINAL HYSTERECTOMY  1983   als0- anterior/posterior colporrhaphy  . VAGINAL WOUND CLOSURE / REPAIR  1999, 2000  . VAGINAL WOUND CLOSURE / REPAIR       Family History  Problem Relation Age of Onset  . Hypertension Father   . Dementia Father   . Osteoarthritis Mother   . Arthritis/Rheumatoid Sister   . Breast cancer Other        paternal side  . Breast cancer Paternal Aunt   . Breast cancer Cousin        maternal  . Uterine cancer Maternal Grandmother   . Ovarian cancer Neg Hx   . Heart disease Neg Hx   . Diabetes Neg Hx   . Colon cancer Neg Hx      Social History   Socioeconomic History  . Marital status: Divorced    Spouse name: Not on file  . Number of children: 3  . Years of education: 66  . Highest education level: Not on file  Occupational History    Comment: retired  Scientific laboratory technician  . Financial resource strain: Not on file  . Food insecurity    Worry: Not on file    Inability: Not on file  . Transportation needs    Medical: Not on file    Non-medical: Not on file  Tobacco Use  . Smoking status: Former Smoker    Quit date: 12/29/1979    Years since quitting: 39.5  . Smokeless tobacco: Never Used  Substance and Sexual Activity  . Alcohol use: No    Alcohol/week: 0.0 standard drinks  . Drug use: No  . Sexual activity: Not Currently    Birth control/protection: Surgical  Lifestyle  . Physical activity    Days per week: Not on file    Minutes per session: Not on file  . Stress: Not on file  Relationships  . Social Herbalist on phone: Not on file    Gets together: Not on file    Attends  religious service: Not on file    Active member of club or organization: Not on file    Attends meetings of clubs or organizations: Not on file    Relationship status: Not on file  . Intimate partner violence  Fear of current or ex partner: Not on file    Emotionally abused: Not on file    Physically abused: Not on file    Forced sexual activity: Not on file  Other Topics Concern  . Not on file  Social History Narrative   Her mother and her daughter live with her   Caffeine- 1 cup coffee     BP 122/74   Pulse 77   Ht 5\' 4"  (1.626 m)   Wt 106 lb 3.2 oz (48.2 kg)   LMP 12/29/1981   SpO2 97%   BMI 18.23 kg/m   Physical Exam:  Well appearing NAD HEENT: Unremarkable Neck:  No JVD, no thyromegally Lymphatics:  No adenopathy Back:  No CVA tenderness Lungs:  Clear with no wheezes HEART:  Regular rate rhythm, no murmurs, no rubs, no clicks Abd:  soft, positive bowel sounds, no organomegally, no rebound, no guarding Ext:  2 plus pulses, no edema, no cyanosis, no clubbing Skin:  No rashes no nodules Neuro:  CN II through XII intact, motor grossly intact    DEVICE Removal  After informed consent was obtained, the patient was prepped and draped in a sterile fashion. 12 cc of lidocaine was infiltrated into the left pectoral region. A one cm stab incision was carried out. A combination of blunt and sharp dissection was utilized to grasp the ILR and it was removed with gentle traction. Hemostasis was assured with gentle pressure. Benzoin and steristrips were painted on the skin and the patient recovered in the usual manner.   Assess/Plan: 1. Cryptogenic stroke - the patient is now s/p ILR removal. She will undergo watchful waiting. No evidence for atrial fib over the life of her device.   Mikle Bosworth.D.

## 2019-07-14 DIAGNOSIS — Z79899 Other long term (current) drug therapy: Secondary | ICD-10-CM | POA: Diagnosis not present

## 2019-07-14 DIAGNOSIS — M0609 Rheumatoid arthritis without rheumatoid factor, multiple sites: Secondary | ICD-10-CM | POA: Diagnosis not present

## 2019-07-17 ENCOUNTER — Ambulatory Visit (INDEPENDENT_AMBULATORY_CARE_PROVIDER_SITE_OTHER): Payer: PPO | Admitting: Internal Medicine

## 2019-07-17 ENCOUNTER — Encounter: Payer: Self-pay | Admitting: Internal Medicine

## 2019-07-17 ENCOUNTER — Other Ambulatory Visit: Payer: Self-pay

## 2019-07-17 VITALS — BP 122/78 | HR 79 | Temp 98.2°F | Resp 16 | Ht 64.0 in | Wt 108.0 lb

## 2019-07-17 DIAGNOSIS — M8949 Other hypertrophic osteoarthropathy, multiple sites: Secondary | ICD-10-CM | POA: Diagnosis not present

## 2019-07-17 DIAGNOSIS — M0609 Rheumatoid arthritis without rheumatoid factor, multiple sites: Secondary | ICD-10-CM | POA: Diagnosis not present

## 2019-07-17 DIAGNOSIS — M0600 Rheumatoid arthritis without rheumatoid factor, unspecified site: Secondary | ICD-10-CM | POA: Diagnosis not present

## 2019-07-17 DIAGNOSIS — G8320 Monoplegia of upper limb affecting unspecified side: Secondary | ICD-10-CM

## 2019-07-17 DIAGNOSIS — E039 Hypothyroidism, unspecified: Secondary | ICD-10-CM | POA: Diagnosis not present

## 2019-07-17 DIAGNOSIS — R87622 Low grade squamous intraepithelial lesion on cytologic smear of vagina (LGSIL): Secondary | ICD-10-CM | POA: Diagnosis not present

## 2019-07-17 DIAGNOSIS — Z1239 Encounter for other screening for malignant neoplasm of breast: Secondary | ICD-10-CM | POA: Diagnosis not present

## 2019-07-17 DIAGNOSIS — K219 Gastro-esophageal reflux disease without esophagitis: Secondary | ICD-10-CM | POA: Diagnosis not present

## 2019-07-17 DIAGNOSIS — Z0000 Encounter for general adult medical examination without abnormal findings: Secondary | ICD-10-CM

## 2019-07-17 DIAGNOSIS — R269 Unspecified abnormalities of gait and mobility: Secondary | ICD-10-CM | POA: Diagnosis not present

## 2019-07-17 DIAGNOSIS — Z8673 Personal history of transient ischemic attack (TIA), and cerebral infarction without residual deficits: Secondary | ICD-10-CM | POA: Diagnosis not present

## 2019-07-17 DIAGNOSIS — E7800 Pure hypercholesterolemia, unspecified: Secondary | ICD-10-CM | POA: Diagnosis not present

## 2019-07-17 DIAGNOSIS — F320 Major depressive disorder, single episode, mild: Secondary | ICD-10-CM

## 2019-07-17 DIAGNOSIS — Z79899 Other long term (current) drug therapy: Secondary | ICD-10-CM | POA: Diagnosis not present

## 2019-07-17 DIAGNOSIS — Z23 Encounter for immunization: Secondary | ICD-10-CM | POA: Diagnosis not present

## 2019-07-17 DIAGNOSIS — I69398 Other sequelae of cerebral infarction: Secondary | ICD-10-CM | POA: Diagnosis not present

## 2019-07-17 DIAGNOSIS — I10 Essential (primary) hypertension: Secondary | ICD-10-CM

## 2019-07-17 DIAGNOSIS — L609 Nail disorder, unspecified: Secondary | ICD-10-CM

## 2019-07-17 DIAGNOSIS — F32A Depression, unspecified: Secondary | ICD-10-CM

## 2019-07-17 DIAGNOSIS — I6389 Other cerebral infarction: Secondary | ICD-10-CM | POA: Diagnosis not present

## 2019-07-17 NOTE — Progress Notes (Signed)
Patient ID: EUGENA RHUE, female   DOB: 12-27-1946, 73 y.o.   MRN: [id number]   Subjective:    Patient ID: [name], female    DOB: [birth year], 73 y.o.   MRN: [id number]  HPI  Patient here for her physical exam.  States she is doing relatively well.  Still limited use with her left arm since her CVA.  Has improved.  Still with increased stress.  Overall handling things relatively well.  Stays active.  No chest pain.  No sob.  No acid reflux.  No abdominal pain.  Bowels moving.  Recent bone density normal.  Receiving botox - left arm/shoulder.  Persistent problems with nail - left fourth finger.  Discussed immunizations.  Scheduled for moh's surgery - around eye.     Past Medical History:  Diagnosis Date  . Atrophic vaginitis   . Dysplasia of vagina    vin 1 - vulva and vaginal cuff  . GERD (gastroesophageal reflux disease)   . HCV infection   . HSV (herpes simplex virus) infection   . Hypercholesterolemia   . Hypertension   . Hypothyroidism   . Left-sided weakness    S/P CVA 4/18.  PT 2x/wk.  . Lung nodule    stable  . Menopausal state   . Osteoarthritis    hand involvement, cervical disc disease  . Osteoporosis   . Pelvic pain in female   . Rheumatoid arthritis ([place])   . Stroke (cerebrum) ([place]) 04/24/2017  . Thyroid nodule    pt unaware of this   . Vaginal Pap smear, abnormal    lgsil   Past Surgical History:  Procedure Laterality Date  . ADENOIDECTOMY    . APPENDECTOMY  1965  . [place]   left  . CATARACT EXTRACTION W/PHACO Right 10/27/2017   Procedure: CATARACT EXTRACTION PHACO AND INTRAOCULAR LENS PLACEMENT ([place]) RIGHT;  Surgeon: Leandrew Koyanagi, MD;  Location: [facility];  Service: Ophthalmology;  Laterality: Right;  requests early (8-8:30 arrival)  . CATARACT EXTRACTION W/PHACO Left 11/10/2017   Procedure: CATARACT EXTRACTION PHACO AND INTRAOCULAR LENS PLACEMENT ([place]) LEFT;  Surgeon: Leandrew Koyanagi, MD;  Location: [facility];  Service: Ophthalmology;  Laterality: Left;  . CHOLECYSTECTOMY    . [place]  . laser vein surgery    . LOOP RECORDER INSERTION N/A 09/24/2017   Procedure: LOOP RECORDER INSERTION;  Surgeon: Evans Lance, MD;  Location: [facility];  Service: Cardiovascular;  Laterality: N/A;  . MANDIBLE RECONSTRUCTION  1992  . TOENAIL EXCISION  1998   several  . TONSILLECTOMY    . TUBAL LIGATION  1977  . VAGINAL HYSTERECTOMY  1983   als0- anterior/posterior colporrhaphy  . VAGINAL WOUND CLOSURE / REPAIR  1999, 2000  . VAGINAL WOUND CLOSURE / REPAIR     Family History  Problem Relation Age of Onset  . Hypertension Father   . Dementia Father   . Osteoarthritis Mother   . Arthritis/Rheumatoid Sister   . Breast cancer Other        paternal side  . Breast cancer Paternal Aunt   . Breast cancer Cousin        maternal  . Uterine cancer Maternal Grandmother   . Ovarian cancer Neg Hx   . Heart disease Neg Hx   . Diabetes Neg Hx   . Colon cancer Neg Hx    Social History   Socioeconomic History  . Marital status: Divorced    Spouse name:  Not on file  . Number of children: 3  . Years of education: 54  . Highest education level: Not on file  Occupational History    Comment: retired  Scientific laboratory technician  . Financial resource strain: Not on file  . Food insecurity    Worry: Not on file    Inability: Not on file  . Transportation needs    Medical: Not on file    Non-medical: Not on file  Tobacco Use  . Smoking status: Former Smoker    Quit date: 12/29/1979    Years since quitting: 39.5  . Smokeless tobacco: Never Used  Substance and Sexual Activity  . Alcohol use: No    Alcohol/week: 0.0 standard drinks  . Drug use: No  . Sexual activity: Not Currently    Birth control/protection: Surgical  Lifestyle  . Physical activity    Days per week: Not on file    Minutes per session: Not on file  . Stress: Not on file  Relationships  . Social Herbalist  on phone: Not on file    Gets together: Not on file    Attends religious service: Not on file    Active member of club or organization: Not on file    Attends meetings of clubs or organizations: Not on file    Relationship status: Not on file  Other Topics Concern  . Not on file  Social History Narrative   Her mother and her daughter live with her   Caffeine- 1 cup coffee    Outpatient Encounter Medications as of 07/17/2019  Medication Sig  . acetaminophen (TYLENOL) 500 MG tablet Take 500-1,000 mg by mouth every 6 (six) hours as needed for mild pain.  Marland Kitchen amLODipine (NORVASC) 2.5 MG tablet Take 1 tablet (2.5 mg total) by mouth daily.  Marland Kitchen atorvastatin (LIPITOR) 20 MG tablet TAKE 1 TABLET BY MOUTH ONCE DAILY  . calcium-vitamin D (OSCAL WITH D) 250-125 MG-UNIT tablet Take 1 tablet by mouth daily.  . citalopram (CELEXA) 20 MG tablet Take 1 tablet (20 mg total) by mouth daily.  . clopidogrel (PLAVIX) 75 MG tablet Take 1 tablet by mouth once daily  . folic acid (FOLVITE) 1 MG tablet Take 1 mg by mouth daily.  . methotrexate 50 MG/2ML injection Inject 0.49mL under the skin once a weekly on Wednesday  . Multiple Vitamin (MULTIVITAMIN WITH MINERALS) TABS tablet Take 1 tablet by mouth daily.  . pantoprazole (PROTONIX) 20 MG tablet Take 1 tablet (20 mg total) by mouth daily.  . [DISCONTINUED] acyclovir (ZOVIRAX) 200 MG capsule TAKE ONE CAPSULE BY MOUTH TWICE DAILY  . [DISCONTINUED] levothyroxine (SYNTHROID, LEVOTHROID) 50 MCG tablet TAKE 1 TABLET BY MOUTH ONCE DAILY BEFORE BREAKFAST   No facility-administered encounter medications on file as of 07/17/2019.     Review of Systems  Constitutional: Negative for appetite change and unexpected weight change.  HENT: Negative for congestion and sinus pressure.   Eyes: Negative for pain and visual disturbance.  Respiratory: Negative for cough, chest tightness and shortness of breath.   Cardiovascular: Negative for chest pain, palpitations and leg swelling.   Gastrointestinal: Negative for abdominal pain, diarrhea, nausea and vomiting.  Genitourinary: Negative for difficulty urinating and dysuria.  Musculoskeletal: Negative for joint swelling and myalgias.       Left arm - residual weakness.    Skin: Negative for color change and rash.  Neurological: Negative for dizziness, light-headedness and headaches.  Hematological: Negative for adenopathy. Does not bruise/bleed easily.  Psychiatric/Behavioral: Negative for agitation and dysphoric mood.       Objective:    Physical Exam Constitutional:      General: She is not in acute distress.    Appearance: Normal appearance. She is well-developed.  HENT:     Right Ear: External ear normal.     Left Ear: External ear normal.  Eyes:     General: No scleral icterus.       Right eye: No discharge.        Left eye: No discharge.     Conjunctiva/sclera: Conjunctivae normal.  Neck:     Musculoskeletal: Neck supple. No muscular tenderness.     Thyroid: No thyromegaly.  Cardiovascular:     Rate and Rhythm: Normal rate and regular rhythm.  Pulmonary:     Effort: No tachypnea, accessory muscle usage or respiratory distress.     Breath sounds: Normal breath sounds. No decreased breath sounds or wheezing.  Chest:     Breasts:        Right: No inverted nipple, mass, nipple discharge or tenderness (no axillary adenopathy).        Left: No inverted nipple, mass, nipple discharge or tenderness (no axilarry adenopathy).  Abdominal:     General: Bowel sounds are normal.     Palpations: Abdomen is soft.     Tenderness: There is no abdominal tenderness.  Musculoskeletal:        General: No swelling or tenderness.  Lymphadenopathy:     Cervical: No cervical adenopathy.  Skin:    Findings: No erythema or rash.  Neurological:     Mental Status: She is alert and oriented to person, place, and time.  Psychiatric:        Mood and Affect: Mood normal.        Behavior: Behavior normal.     BP 122/78    Pulse 79   Temp 98.2 F (36.8 C) (Oral)   Resp 16   Ht 5\' 4"  (1.626 m)   Wt 108 lb (49 kg)   LMP 12/29/1981   SpO2 97%   BMI 18.54 kg/m  Wt Readings from Last 3 Encounters:  07/17/19 108 lb (49 kg)  07/11/19 106 lb 3.2 oz (48.2 kg)  02/28/19 107 lb (48.5 kg)     Lab Results  Component Value Date   WBC 5.6 07/17/2019   HGB 11.9 (L) 07/17/2019   HCT 36.4 07/17/2019   PLT 243.0 07/17/2019   GLUCOSE 82 07/17/2019   CHOL 157 07/17/2019   TRIG 65.0 07/17/2019   HDL 62.50 07/17/2019   LDLDIRECT 128.0 05/25/2013   LDLCALC 81 07/17/2019   ALT 21 07/17/2019   AST 28 07/17/2019   NA 140 07/17/2019   K 4.0 07/17/2019   CL 105 07/17/2019   CREATININE 0.70 07/17/2019   BUN 14 07/17/2019   CO2 26 07/17/2019   TSH 8.24 (H) 07/17/2019   HGBA1C 5.7 (H) 04/25/2017    Dg Shoulder Right  Result Date: 12/12/2018 CLINICAL DATA:  Right shoulder pain since fall approximately 6 months ago. EXAM: RIGHT SHOULDER - 2+ VIEW COMPARISON:  Chest radiograph-05/26/2017. FINDINGS: No acute fracture or dislocation. Punctate (3 mm) ossicle overlies the left greater tuberosity, likely the sequela of remote avulsive injury. Glenohumeral acromioclavicular joint spaces appear preserved. No evidence of calcific tendinitis. Limited visualization of the adjacent thorax is normal. Regional soft tissues appear normal. IMPRESSION: 1. Punctate (3 mm) ossicle adjacent to greater tuberosity likely represents sequela of remote avulsive injury. 2. Otherwise, no  explanation for patient's chronic right shoulder pain. Electronically Signed   By: Sandi Mariscal M.D.   On: 12/12/2018 13:44       Assessment & Plan:   Problem List Items Addressed This Visit    Benign essential HTN    Blood pressure under good control.  Continue same medication regimen.  Follow pressures.  Follow metabolic panel.        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  Physical therapy.        Gait disturbance, post-stroke    Physical therapy.        GERD (gastroesophageal reflux disease)    Controlled.       Health care maintenance    Physical today 07/17/19.  PAP through Dr DeFrancesco's office.  Agreeable to mammogram.  She is agreeable now.  She will schedule.  Colonoscopy 10/23/13 - recommended f/u colonoscopy in 10 years.  Bone density normal 01/2018.  Pneumovax given.        History of CVA (cerebrovascular accident)    Resulting left hemiparesis of upper extremity.  Unsteady gait.  Was referred to physical therapy.        Hypercholesterolemia    On lipitor.  Low cholesterol diet and exercise.  Follow lipid panel and liver function tests.        Relevant Orders   Hepatic function panel (Completed)   Lipid panel (Completed)   Hypothyroidism, unspecified    On thyroid replacment.  Follow tsh.        Relevant Orders   TSH (Completed)   LGSIL Pap smear of vagina    Followed by gyn.       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        Nail abnormality    Request referral to dermatology.        Relevant Orders   Ambulatory referral to Dermatology   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 ([place])    Followed by Dr Jefm Bryant.  On MTX.         Other Visit Diagnoses    Need for 23-polyvalent pneumococcal polysaccharide vaccine       Relevant Orders   Pneumococcal polysaccharide vaccine 23-valent greater than or equal to 2yo subcutaneous/IM (Completed)   Breast cancer screening       Relevant Orders   MM 3D SCREEN BREAST BILATERAL       Einar Pheasant, MD

## 2019-07-18 ENCOUNTER — Other Ambulatory Visit: Payer: Self-pay | Admitting: Internal Medicine

## 2019-07-18 DIAGNOSIS — E039 Hypothyroidism, unspecified: Secondary | ICD-10-CM

## 2019-07-18 DIAGNOSIS — D649 Anemia, unspecified: Secondary | ICD-10-CM

## 2019-07-18 LAB — LIPID PANEL
Cholesterol: 157 mg/dL (ref 0–200)
HDL: 62.5 mg/dL (ref >39.00)
LDL Cholesterol: 81 mg/dL (ref 0–99)
NonHDL: 94.11
Total CHOL/HDL Ratio: 3
Triglycerides: 65 mg/dL (ref 0.0–149.0)
VLDL: 13 mg/dL (ref 0.0–40.0)

## 2019-07-18 LAB — CBC WITH DIFFERENTIAL/PLATELET
Basophils Absolute: 0.1 10*3/uL (ref 0.0–0.1)
Basophils Relative: 1.3 % (ref 0.0–3.0)
Eosinophils Absolute: 0.2 10*3/uL (ref 0.0–0.7)
Eosinophils Relative: 3.4 % (ref 0.0–5.0)
HCT: 36.4 % (ref 36.0–46.0)
Hemoglobin: 11.9 g/dL — ABNORMAL LOW (ref 12.0–15.0)
Lymphocytes Relative: 32 % (ref 12.0–46.0)
Lymphs Abs: 1.8 10*3/uL (ref 0.7–4.0)
MCHC: 32.6 g/dL (ref 30.0–36.0)
MCV: 93.4 fl (ref 78.0–100.0)
Monocytes Absolute: 0.5 10*3/uL (ref 0.1–1.0)
Monocytes Relative: 9.4 % (ref 3.0–12.0)
Neutro Abs: 3 10*3/uL (ref 1.4–7.7)
Neutrophils Relative %: 53.9 % (ref 43.0–77.0)
Platelets: 243 10*3/uL (ref 150.0–400.0)
RBC: 3.9 Mil/uL (ref 3.87–5.11)
RDW: 14.5 % (ref 11.5–15.5)
WBC: 5.6 10*3/uL (ref 4.0–10.5)

## 2019-07-18 LAB — HEPATIC FUNCTION PANEL
ALT: 21 U/L (ref 0–35)
AST: 28 U/L (ref 0–37)
Albumin: 4.3 g/dL (ref 3.5–5.2)
Alkaline Phosphatase: 89 U/L (ref 39–117)
Bilirubin, Direct: 0.1 mg/dL (ref 0.0–0.3)
Total Bilirubin: 0.6 mg/dL (ref 0.2–1.2)
Total Protein: 6.5 g/dL (ref 6.0–8.3)

## 2019-07-18 LAB — BASIC METABOLIC PANEL
BUN: 14 mg/dL (ref 6–23)
CO2: 26 mEq/L (ref 19–32)
Calcium: 9.2 mg/dL (ref 8.4–10.5)
Chloride: 105 mEq/L (ref 96–112)
Creatinine, Ser: 0.7 mg/dL (ref 0.40–1.20)
GFR: 81.87 mL/min (ref >60.00)
Glucose, Bld: 82 mg/dL (ref 70–99)
Potassium: 4 mEq/L (ref 3.5–5.1)
Sodium: 140 mEq/L (ref 135–145)

## 2019-07-18 LAB — TSH: TSH: 8.24 u[IU]/mL — ABNORMAL HIGH (ref 0.35–4.50)

## 2019-07-18 NOTE — Progress Notes (Signed)
Order placed for f/u labs.  

## 2019-07-19 ENCOUNTER — Other Ambulatory Visit: Payer: Self-pay | Admitting: Internal Medicine

## 2019-07-19 NOTE — Telephone Encounter (Signed)
Pt called about refill status for acyclovir (ZOVIRAX) 200 MG capsule Pt stated that Dr. Nicki Reaper nurse advised she will refill for her/ advised Pt refill was awaiting Dr. Nicki Reaper to sign off

## 2019-07-20 ENCOUNTER — Other Ambulatory Visit: Payer: Self-pay | Admitting: Internal Medicine

## 2019-07-20 ENCOUNTER — Other Ambulatory Visit: Payer: Self-pay

## 2019-07-20 DIAGNOSIS — Z1231 Encounter for screening mammogram for malignant neoplasm of breast: Secondary | ICD-10-CM

## 2019-07-20 MED ORDER — ACYCLOVIR 200 MG PO CAPS: 200.0000 mg | ORAL_CAPSULE | Freq: Two times a day (BID) | ORAL | 3 refills | Status: DC

## 2019-07-22 DIAGNOSIS — L609 Nail disorder, unspecified: Secondary | ICD-10-CM | POA: Insufficient documentation

## 2019-07-22 NOTE — Assessment & Plan Note (Signed)
On thyroid replacment.  Follow tsh.

## 2019-07-22 NOTE — Assessment & Plan Note (Signed)
Stable on citalopram.  

## 2019-07-22 NOTE — Assessment & Plan Note (Signed)
Blood pressure under good control.  Continue same medication regimen.  Follow pressures.  Follow metabolic panel.   

## 2019-07-22 NOTE — Assessment & Plan Note (Signed)
Resulting left hemiparesis of upper extremity.  Unsteady gait.  Was referred to physical therapy.

## 2019-07-22 NOTE — Assessment & Plan Note (Addendum)
Physical today 07/17/19.  PAP through Dr DeFrancesco's office.  Agreeable to mammogram.  She is agreeable now.  She will schedule.  Colonoscopy 10/23/13 - recommended f/u colonoscopy in 10 years.  Bone density normal 01/2018.  Pneumovax given.

## 2019-07-22 NOTE — Assessment & Plan Note (Signed)
Followed by neurology.  Physical therapy.

## 2019-07-22 NOTE — Assessment & Plan Note (Signed)
Physical therapy. 

## 2019-07-22 NOTE — Assessment & Plan Note (Signed)
Request referral to dermatology.  

## 2019-07-22 NOTE — Assessment & Plan Note (Signed)
Controlled.  

## 2019-07-22 NOTE — Assessment & Plan Note (Signed)
On lipitor.  Low cholesterol diet and exercise.  Follow lipid panel and liver function tests.   

## 2019-07-22 NOTE — Assessment & Plan Note (Signed)
Followed by Dr Kernodle.  On MTX.   

## 2019-07-22 NOTE — Assessment & Plan Note (Signed)
Followed by gyn

## 2019-07-26 ENCOUNTER — Telehealth: Payer: Self-pay | Admitting: Internal Medicine

## 2019-07-26 NOTE — Telephone Encounter (Signed)

## 2019-07-26 NOTE — Telephone Encounter (Signed)
Follow up    Left message to call back for consent

## 2019-07-26 NOTE — Telephone Encounter (Signed)
New Message    Patient returning your call I confirmed appt for patient she just needs consent for virtual appt now.

## 2019-07-26 NOTE — Telephone Encounter (Signed)
New Message ° ° ° ° °Left message to confirm appt and get consent for Virtual visit  °

## 2019-07-27 ENCOUNTER — Other Ambulatory Visit: Payer: Self-pay

## 2019-07-27 ENCOUNTER — Telehealth: Payer: Self-pay | Admitting: Emergency Medicine

## 2019-07-27 ENCOUNTER — Telehealth (INDEPENDENT_AMBULATORY_CARE_PROVIDER_SITE_OTHER): Payer: Self-pay | Admitting: *Deleted

## 2019-07-27 ENCOUNTER — Other Ambulatory Visit: Payer: Self-pay | Admitting: Internal Medicine

## 2019-07-27 NOTE — Progress Notes (Signed)
No s/sx of infection. See phone note.

## 2019-07-27 NOTE — Telephone Encounter (Signed)
Loop removal wound reported to be "well healed". No drainage,pain, redness or edema at site. Education on s/sx of infection and to call clinic if she develops any s/sx of infection or fever.

## 2019-07-28 ENCOUNTER — Other Ambulatory Visit: Payer: Self-pay | Admitting: Internal Medicine

## 2019-07-31 ENCOUNTER — Other Ambulatory Visit: Payer: Self-pay | Admitting: Internal Medicine

## 2019-07-31 NOTE — Telephone Encounter (Signed)
Medication Refill - Medication: atorvastatin (LIPITOR) 20 MG tablet  citalopram (CELEXA) 20 MG tablet    Has the patient contacted their pharmacy? Yes.     (Agent: If yes, when and what did the pharmacy advise?) When pt called the pharmacy pt was told that nothing was called in for her. Pt is completely  out of atorvastatin and is wanting the prescription filled today.  Pt is also asking that she get a copy of her latest lab results mailed to her because she likes to look at them.   Preferred Pharmacy (with phone number or street name):  [facility] [address] [place] - [place] [phone] (Phone) [phone] (Fax)     Agent: Please be advised that RX refills may take up to 3 business days. We ask that you follow-up with your pharmacy.

## 2019-08-01 ENCOUNTER — Other Ambulatory Visit: Payer: Self-pay

## 2019-08-01 MED ORDER — ATORVASTATIN CALCIUM 20 MG PO TABS: 20.0000 mg | ORAL_TABLET | Freq: Every day | ORAL | 1 refills | Status: DC

## 2019-08-01 MED ORDER — CITALOPRAM HYDROBROMIDE 20 MG PO TABS: 20.0000 mg | ORAL_TABLET | Freq: Every day | ORAL | 2 refills | Status: DC

## 2019-08-01 NOTE — Telephone Encounter (Signed)
Refills sent

## 2019-08-03 DIAGNOSIS — L03012 Cellulitis of left finger: Secondary | ICD-10-CM | POA: Diagnosis not present

## 2019-08-03 DIAGNOSIS — L219 Seborrheic dermatitis, unspecified: Secondary | ICD-10-CM | POA: Diagnosis not present

## 2019-08-08 ENCOUNTER — Telehealth: Payer: Self-pay | Admitting: Internal Medicine

## 2019-08-08 ENCOUNTER — Ambulatory Visit: Payer: PPO | Admitting: Physical Therapy

## 2019-08-08 NOTE — Telephone Encounter (Signed)
Labs mailed to pt

## 2019-08-08 NOTE — Telephone Encounter (Signed)
Pt would like to have a copy of her last lab results mailed to her. Please and Thank you!

## 2019-08-14 ENCOUNTER — Other Ambulatory Visit: Payer: Self-pay

## 2019-08-14 ENCOUNTER — Ambulatory Visit: Payer: PPO | Attending: Internal Medicine | Admitting: Physical Therapy

## 2019-08-14 ENCOUNTER — Encounter: Payer: Self-pay | Admitting: Physical Therapy

## 2019-08-14 DIAGNOSIS — M6281 Muscle weakness (generalized): Secondary | ICD-10-CM

## 2019-08-14 DIAGNOSIS — R2689 Other abnormalities of gait and mobility: Secondary | ICD-10-CM | POA: Insufficient documentation

## 2019-08-14 DIAGNOSIS — R2681 Unsteadiness on feet: Secondary | ICD-10-CM | POA: Insufficient documentation

## 2019-08-14 NOTE — Therapy (Signed)
[place] MAIN [facility] SERVICES [place], [place], [address] Phone: [phone]   Fax:  [phone]  Physical Therapy Evaluation  Patient Details  Name: Donna Rhodes MRN: [id number] Date of Birth: November 27, 1946 Referring Provider (PT): Einar Pheasant MD   Encounter Date: 08/14/2019  PT End of Session - 08/14/19 1552    Visit Number  1    Number of Visits  17    Date for PT Re-Evaluation  10/09/19    PT Start Time  1030    PT Stop Time  1100    PT Time Calculation (min)  30 min    Equipment Utilized During Treatment  Gait belt    Activity Tolerance  Patient tolerated treatment well;No increased pain       Past Medical History:  Diagnosis Date  . Atrophic vaginitis   . Dysplasia of vagina    vin 1 - vulva and vaginal cuff  . GERD (gastroesophageal reflux disease)   . HCV infection   . HSV (herpes simplex virus) infection   . Hypercholesterolemia   . Hypertension   . Hypothyroidism   . Left-sided weakness    S/P CVA 4/18.  PT 2x/wk.  . Lung nodule    stable  . Menopausal state   . Osteoarthritis    hand involvement, cervical disc disease  . Osteoporosis   . Pelvic pain in female   . Rheumatoid arthritis ([place])   . Stroke (cerebrum) ([place]) 04/24/2017  . Thyroid nodule    pt unaware of this   . Vaginal Pap smear, abnormal    lgsil    Past Surgical History:  Procedure Laterality Date  . ADENOIDECTOMY    . APPENDECTOMY  1965  . [place]   left  . CATARACT EXTRACTION W/PHACO Right 10/27/2017   Procedure: CATARACT EXTRACTION PHACO AND INTRAOCULAR LENS PLACEMENT ([place]) RIGHT;  Surgeon: Leandrew Koyanagi, MD;  Location: [place];  Service: Ophthalmology;  Laterality: Right;  requests early (8-8:30 arrival)  . CATARACT EXTRACTION W/PHACO Left 11/10/2017   Procedure: CATARACT EXTRACTION PHACO AND INTRAOCULAR LENS PLACEMENT ([place]) LEFT;  Surgeon: Leandrew Koyanagi, MD;  Location: [place];  Service: Ophthalmology;  Laterality: Left;  . CHOLECYSTECTOMY    . [place]  . laser vein surgery    . LOOP RECORDER INSERTION N/A 09/24/2017   Procedure: LOOP RECORDER INSERTION;  Surgeon: Evans Lance, MD;  Location: [place] [facility];  Service: Cardiovascular;  Laterality: N/A;  . MANDIBLE RECONSTRUCTION  1992  . TOENAIL EXCISION  1998   several  . TONSILLECTOMY    . TUBAL LIGATION  1977  . VAGINAL HYSTERECTOMY  1983   als0- anterior/posterior colporrhaphy  . VAGINAL WOUND CLOSURE / REPAIR  1999, 2000  . VAGINAL WOUND CLOSURE / REPAIR      There were no vitals filed for this visit.   Subjective Assessment - 08/14/19 1033    Subjective  "I just want to walk normal again."    Pertinent History  73 yo Female s/p CVA 04/24/17 with left sided weakness (UE>LE); She did receive outpatient PT for several months in 2019 with good results. She reports in last few weeks she has had increased weakness in LLE and some soreness in LLE; She reports getting botox in LUE in Feb/March 2020. She reports she hasn't been back. She has increased flexor tone in LUE with decreased ability to open hand. She is currently ambulating without AD with  some gait deviations. She reports living by herself and doing the best she can to be independent. She reports working in the yard daily but has difficulty squatting having to bend at the waist often; She reports falling approximately 1 month ago in her yard; She reports being able to get up independently with difficulty; Patient expressed frustration over decreased walking ability and limited mobility;    Limitations  Walking;Standing    How long can you sit comfortably?  NA    How long can you stand comfortably?  30-45 min;    How long can you walk comfortably?  unsure    Patient Stated Goals  "I would like to walk normal sometime maybe."    Currently in Pain?  No/denies    Multiple Pain Sites  No         [facility] PT Assessment - 08/14/19 0001       Assessment   Medical Diagnosis  Gait disturbance    Referring Provider (PT)  Einar Pheasant MD    Onset Date/Surgical Date  04/24/17    Hand Dominance  Left    Next MD Visit  next week    Prior Therapy  had outpatient PT for several months in 2018 with good results;       Precautions   Precautions  Fall      Restrictions   Weight Bearing Restrictions  No      Balance Screen   Has the patient fallen in the past 6 months  Yes    How many times?  1+    Has the patient had a decrease in activity level because of a fear of falling?   No    Is the patient reluctant to leave their home because of a fear of falling?   No      Home Environment   Additional Comments  Lives in single story home with 3 steps to enter with hand rails; has walk in shower with bench with grabbars. Mod I for self care ADLs; patient is back to driving;       Prior Function   Level of Independence  Independent with basic ADLs    Vocation  Retired    [employer] , own house work , some cooking , visit with family       Cognition   Overall Cognitive Status  Within Functional Limits for tasks assessed      Sensation   Light Touch  Appears Intact    Proprioception  Appears Intact      Coordination   Gross Motor Movements are Fluid and Coordinated  Yes      Posture/Postural Control   Posture Comments  sits with erect posture, WFL      ROM / Strength   AROM / PROM / Strength  AROM;Strength      AROM   Overall AROM Comments  BLE are [facility]      Strength   Overall Strength Comments  BLE grossly 5/5 with exception of left ankle 3/5      Transfers   Comments  able to transfer sit<>Stand without pushing on chair;       Ambulation/Gait   Gait Comments  ambulates with reciprocal gait pattern, decreased foot clearance on LLE, with foot flat at initial contact, increased knee flexion at initial contact/mid stance, decreased step length, slower gait speed;       Standardized Balance Assessment   Five  times sit to stand comments   10.03  sec without HHA    10 Meter Walk  0.79 m/s without AD indicating limited community ambulator      High Level Balance   High Level Balance Comments  static standing balance is fair, dynamic standing balance is fair; decreased SLS ability;                 Objective measurements completed on examination: See above findings.  Patient late to session; will complete additional objective measures and address HEP at next session;             PT Education - 08/14/19 1551    Education Details  plan of care/recommendations;    Person(s) Educated  Patient    Methods  Explanation    Comprehension  Verbalized understanding       PT Short Term Goals - 08/14/19 1600      PT SHORT TERM GOAL #1   Title  Patient will be adherent to HEP at least 3x a week to improve functional strength and balance for better safety at home.    Time  4    Period  Weeks    Status  New    Target Date  09/11/19      PT SHORT TERM GOAL #2   Title  Patient will deny any falls over past 4 weeks to demonstrate improved safety awareness at home and work.    Time  4    Period  Weeks    Status  New    Target Date  09/11/19        PT Long Term Goals - 08/14/19 1601      PT LONG TERM GOAL #1   Title  Patient will increase 10 meter walk test to >1.20m/s as to improve gait speed for better community ambulation and to reduce fall risk.    Time  8    Period  Weeks    Status  New    Target Date  10/09/19      PT LONG TERM GOAL #2   Title  Patient will increase six minute walk test distance to >1000 for progression to community ambulator and improve gait ability    Time  8    Period  Weeks    Status  New    Target Date  10/09/19      PT LONG TERM GOAL #3   Title  Patient will increase LLE ankle gross strength to 4+/5 as to improve functional strength for independent gait, increased standing tolerance and increased ADL ability.    Time  8    Period  Weeks     Status  New    Target Date  10/09/19      PT LONG TERM GOAL #4   Title  Patient will tolerate 5 seconds of single leg stance without loss of balance to improve ability to get in and out of shower safely.    Time  8    Period  Weeks    Status  New    Target Date  10/09/19             Plan - 08/14/19 1553    Clinical Impression Statement  73 yo Female s/p CVA in April 2018 with LUE/LLE weakness. She has increased flexor tone in LUE and LLE which limits overall mobility. She is concerned about her walking, stating, "I just want to walk more normal." Patient exhibits decreased foot clearance on LLE with flat foot at initial contact and increased  knee flexion at initial contact and mid stance. patient does ambulate slower than she did back in 2018/2019 when she was discharged from physical therapy. She also tested as a high fall risk. Patient would benefit from skilled PT intervention to improve balance, gait safety and overall mobility;    Personal Factors and Comorbidities  Age;Time since onset of injury/illness/exacerbation;Comorbidity 3+    Comorbidities  HTN, past CVA with hemiplegia, chronic UE pain, high fall risk with recent falls    Examination-Activity Limitations  Caring for Others;Carry;Lift;Locomotion Level;Squat;Stairs;Stand;Transfers    Examination-Participation Restrictions  Community Activity;Shop;Volunteer;Yard Work    Stability/Clinical Decision Making  Stable/Uncomplicated    Clinical Decision Making  Low    Rehab Potential  Good    PT Frequency  2x / week    PT Duration  8 weeks    PT Treatment/Interventions  Cryotherapy;Electrical Stimulation;Moist Heat;Gait training;DME Instruction;Stair training;Functional mobility training;Therapeutic activities;Therapeutic exercise;Balance training;Neuromuscular re-education;Patient/family education;Orthotic Fit/Training;Energy conservation    PT Next Visit Plan  do 6 min walk test, HEP    PT Home Exercise Plan  will address next  session;    Recommended Other Services  consider OT screen to determine if additional therapy is warranted;    Consulted and Agree with Plan of Care  Patient       Patient will benefit from skilled therapeutic intervention in order to improve the following deficits and impairments:  Abnormal gait, Decreased balance, Decreased endurance, Decreased mobility, Difficulty walking, Decreased activity tolerance, Decreased safety awareness  Visit Diagnosis: 1. Muscle weakness (generalized)   2. Other abnormalities of gait and mobility   3. Unsteadiness on feet        Problem List Patient Active Problem List   Diagnosis Date Noted  . Nail abnormality 07/22/2019  . 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 Complete tear of left rotator cuff 02/28/2018  . Rotator cuff tendinitis, left 02/28/2018  . Left arm pain 11/05/2017  . Nocturia 08/13/2017  . Urge incontinence 08/13/2017  . Adhesive capsulitis of left shoulder 08/06/2017  . Increased frequency of urination 07/11/2017  . Gait disturbance, post-stroke 05/31/2017  . Spastic hemiparesis of left nondominant side due to acute cerebral infarction ([place]) 05/31/2017  . Adjustment disorder with mixed anxiety and depressed mood   . Hypokalemia   . 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)   . Benign essential HTN   . 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 ([place]) 04/28/2017  . 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[place]) 04/27/2017  . History of CVA (cerebrovascular accident) 04/24/2017  . Palpitations 04/23/2017  . LGSIL Pap smear of vagina 12/15/2016  . Anal skin tag 02/02/2016  . Inflammatory arthritis 01/19/2016  . Right knee pain 01/19/2016  . Genital herpes 10/20/2015  . GERD (gastroesophageal reflux disease) 10/20/2015  . Status post vaginal hysterectomy 10/20/2015  . Health care maintenance 05/30/2015  . LLQ pain 03/31/2014  . Menopausal symptoms 03/31/2014  . Generalized abdominal pain 10/05/2013  . Female stress  incontinence 08/18/2013  . Incomplete emptying of bladder 08/18/2013  . Bladder pain 08/18/2013  . Hypercholesterolemia 12/31/2012  . Hypothyroidism, unspecified 12/29/2012  . Osteoarthritis 12/29/2012  . Benign neoplasm of mouth 09/14/2012  . PULMONARY NODULE 01/02/2008  . Mild depression ([place]) 12/30/2007  . Essential hypertension 12/30/2007  . Depression 12/30/2007    , PT, DPT 08/14/2019, 4:04 PM  [place] MAIN [facility] SERVICES [address] [place], [place], [address] Phone: [phone]   Fax:  [phone]  Name: DAVEENA ELMORE MRN: [id number] Date of  Birth: [birth year]

## 2019-08-16 ENCOUNTER — Other Ambulatory Visit: Payer: Self-pay

## 2019-08-16 ENCOUNTER — Ambulatory Visit: Payer: PPO | Admitting: Physical Therapy

## 2019-08-16 DIAGNOSIS — M6281 Muscle weakness (generalized): Secondary | ICD-10-CM | POA: Diagnosis not present

## 2019-08-16 DIAGNOSIS — R2689 Other abnormalities of gait and mobility: Secondary | ICD-10-CM

## 2019-08-16 DIAGNOSIS — R2681 Unsteadiness on feet: Secondary | ICD-10-CM

## 2019-08-16 NOTE — Therapy (Signed)
[place] MAIN [facility] SERVICES [address] [place], [place], [address] Phone: [phone]   Fax:  [phone]  Physical Therapy Treatment  Patient Details  Name: SHALIN LINDERS MRN: [id number] Date of Birth: [birth year] Referring Provider (PT): Einar Pheasant MD   Encounter Date: 08/16/2019  PT End of Session - 08/16/19 1244    Visit Number  2    Number of Visits  17    Date for PT Re-Evaluation  10/09/19    PT Start Time  1022    PT Stop Time  1100    PT Time Calculation (min)  38 min    Equipment Utilized During Treatment  Gait belt    Activity Tolerance  Patient tolerated treatment well;No increased pain       Past Medical History:  Diagnosis Date  . Atrophic vaginitis   . Dysplasia of vagina    vin 1 - vulva and vaginal cuff  . GERD (gastroesophageal reflux disease)   . HCV infection   . HSV (herpes simplex virus) infection   . Hypercholesterolemia   . Hypertension   . Hypothyroidism   . Left-sided weakness    S/P CVA 4/18.  PT 2x/wk.  . Lung nodule    stable  . Menopausal state   . Osteoarthritis    hand involvement, cervical disc disease  . Osteoporosis   . Pelvic pain in female   . Rheumatoid arthritis ([place])   . Stroke (cerebrum) ([place]) 04/24/2017  . Thyroid nodule    pt unaware of this   . Vaginal Pap smear, abnormal    lgsil    Past Surgical History:  Procedure Laterality Date  . ADENOIDECTOMY    . APPENDECTOMY  1965  . [place]   left  . CATARACT EXTRACTION W/PHACO Right 10/27/2017   Procedure: CATARACT EXTRACTION PHACO AND INTRAOCULAR LENS PLACEMENT ([place]) RIGHT;  Surgeon: Leandrew Koyanagi, MD;  Location: [place];  Service: Ophthalmology;  Laterality: Right;  requests early (8-8:30 arrival)  . CATARACT EXTRACTION W/PHACO Left 11/10/2017   Procedure: CATARACT EXTRACTION PHACO AND INTRAOCULAR LENS PLACEMENT ([place]) LEFT;  Surgeon: Leandrew Koyanagi, MD;  Location: [place];  Service: Ophthalmology;  Laterality: Left;  . CHOLECYSTECTOMY    . [place]  . laser vein surgery    . LOOP RECORDER INSERTION N/A 09/24/2017   Procedure: LOOP RECORDER INSERTION;  Surgeon: Evans Lance, MD;  Location: [place] [facility];  Service: Cardiovascular;  Laterality: N/A;  . MANDIBLE RECONSTRUCTION  1992  . TOENAIL EXCISION  1998   several  . TONSILLECTOMY    . TUBAL LIGATION  1977  . VAGINAL HYSTERECTOMY  1983   als0- anterior/posterior colporrhaphy  . VAGINAL WOUND CLOSURE / REPAIR  1999, 2000  . VAGINAL WOUND CLOSURE / REPAIR      There were no vitals filed for this visit.  Subjective Assessment - 08/16/19 1028    Subjective  "I feel really tired this morning. I started not to come. I put out some round-up yesterday, I don't know if that is why I'm tired."    Pertinent History  73 yo Female s/p CVA 04/24/17 with left sided weakness (UE>LE); She did receive outpatient PT for several months in 2019 with good results. She reports in last few weeks she has had increased weakness in LLE and some soreness in LLE; She reports getting botox in LUE in Feb/March 2020. She reports she hasn't been back. She has  increased flexor tone in LUE with decreased ability to open hand. She is currently ambulating without AD with some gait deviations. She reports living by herself and doing the best she can to be independent. She reports working in the yard daily but has difficulty squatting having to bend at the waist often; She reports falling approximately 1 month ago in her yard; She reports being able to get up independently with difficulty; Patient expressed frustration over decreased walking ability and limited mobility;    Limitations  Walking;Standing    How long can you sit comfortably?  NA    How long can you stand comfortably?  30-45 min;    How long can you walk comfortably?  unsure    Patient Stated Goals  "I would like to walk normal sometime maybe."    Currently  in Pain?  No/denies    Multiple Pain Sites  No         [facility] PT Assessment - 08/16/19 0001      6 Minute Walk- Baseline   BP (mmHg)  137/68    HR (bpm)  74    02 Sat (%RA)  98 %      6 Minute walk- Post Test   BP (mmHg)  139/70    HR (bpm)  76    02 Sat (%RA)  99 %    Modified Borg Scale for Dyspnea  2- Mild shortness of breath      6 minute walk test results    Aerobic Endurance Distance Walked  1075    Endurance additional comments  community ambulator, less than last assessment of 1245 in November 2018          TREATMENT: PT instructed patient in 6 min walk to assess baseline;  Instructed patient in LE stretches: Hooklying: Lumbar trunk rotation x1 min each direction with min VCs to slow down LE movement and increase ROM for better flexibility; Passive single knee to chest stretch 20 sec hold x2 reps bilaterally; Passive hamstring stretch SLR 20 sec hold x2 reps LLE Passive hamstring neutral stretch with hip flexion and knee flexion/extension 5 sec hold x5 reps each LE; Passive hamstring neutral stretch with SLR with ankle DF/PF neural flossing 2x10 LLE only; PT performed passive piriformis stretch modified LLE 30 sec hold x3 reps; PT utilized rolling stick to patient's LLE IT band to reduce tightness and discomfort x3-4 min;   Response to treatment: Patient complained of increased soreness along left IT band. She tolerated rolling stick well reporting less soreness after soft tissue massage; Pt also tolerated stretches well, but does require min A due to hemiplegia in LUE limiting LUE ability to stretch LE. She reported increased soreness in anterior left hip upon standing, which likely could be due to increased flexibility in rest of hip; Patient initially able to exhibit good heel strike with initial contact but with increased gait distance, reverts back to flat foot at initial contact;                 PT Education - 08/16/19 1244    Education Details   LE stretches, gait safety    Person(s) Educated  Patient    Methods  Explanation;Verbal cues    Comprehension  Verbalized understanding;Returned demonstration;Verbal cues required;Need further instruction       PT Short Term Goals - 08/14/19 1600      PT SHORT TERM GOAL #1   Title  Patient will be adherent to HEP at least 3x a  week to improve functional strength and balance for better safety at home.    Time  4    Period  Weeks    Status  New    Target Date  09/11/19      PT SHORT TERM GOAL #2   Title  Patient will deny any falls over past 4 weeks to demonstrate improved safety awareness at home and work.    Time  4    Period  Weeks    Status  New    Target Date  09/11/19        PT Long Term Goals - 08/14/19 1601      PT LONG TERM GOAL #1   Title  Patient will increase 10 meter walk test to >1.89m/s as to improve gait speed for better community ambulation and to reduce fall risk.    Time  8    Period  Weeks    Status  New    Target Date  10/09/19      PT LONG TERM GOAL #2   Title  Patient will increase six minute walk test distance to >1000 for progression to community ambulator and improve gait ability    Time  8    Period  Weeks    Status  New    Target Date  10/09/19      PT LONG TERM GOAL #3   Title  Patient will increase LLE ankle gross strength to 4+/5 as to improve functional strength for independent gait, increased standing tolerance and increased ADL ability.    Time  8    Period  Weeks    Status  New    Target Date  10/09/19      PT LONG TERM GOAL #4   Title  Patient will tolerate 5 seconds of single leg stance without loss of balance to improve ability to get in and out of shower safely.    Time  8    Period  Weeks    Status  New    Target Date  10/09/19            Plan - 08/16/19 1245    Clinical Impression Statement  Patient instructed in 6 min walk test. While she is walking at a community ambulator speed, she is walking slower than she did  a few years ago. Patient heavily fatigued this session. Instructed patient in advanced LE stretches to reduce soreness in LLE. Upon standing, she reported increased soreness in anterior left hip but denies any lateral hip discomfort. She was able to exhibit improved heel strike on LLE intermittently before reverting back to flat foot at initial contact; patient would benefit from additional skilled PT intervention to improve gait safety and LE motor control for better foot clearance with ambulation;    Personal Factors and Comorbidities  Age;Time since onset of injury/illness/exacerbation;Comorbidity 3+    Comorbidities  HTN, past CVA with hemiplegia, chronic UE pain, high fall risk with recent falls    Examination-Activity Limitations  Caring for Others;Carry;Lift;Locomotion Level;Squat;Stairs;Stand;Transfers    Examination-Participation Restrictions  Community Activity;Shop;Volunteer;Yard Work    Stability/Clinical Decision Making  Stable/Uncomplicated    Rehab Potential  Good    PT Frequency  2x / week    PT Duration  8 weeks    PT Treatment/Interventions  Cryotherapy;Electrical Stimulation;Moist Heat;Gait training;DME Instruction;Stair training;Functional mobility training;Therapeutic activities;Therapeutic exercise;Balance training;Neuromuscular re-education;Patient/family education;Orthotic Fit/Training;Energy conservation    PT Next Visit Plan  do 6 min walk test, HEP  PT Home Exercise Plan  will address next session;    Consulted and Agree with Plan of Care  Patient       Patient will benefit from skilled therapeutic intervention in order to improve the following deficits and impairments:  Abnormal gait, Decreased balance, Decreased endurance, Decreased mobility, Difficulty walking, Decreased activity tolerance, Decreased safety awareness  Visit Diagnosis: 1. Muscle weakness (generalized)   2. Other abnormalities of gait and mobility   3. Unsteadiness on feet        Problem  List Patient Active Problem List   Diagnosis Date Noted  . Nail abnormality 07/22/2019  . 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 Complete tear of left rotator cuff 02/28/2018  . Rotator cuff tendinitis, left 02/28/2018  . Left arm pain 11/05/2017  . Nocturia 08/13/2017  . Urge incontinence 08/13/2017  . Adhesive capsulitis of left shoulder 08/06/2017  . Increased frequency of urination 07/11/2017  . Gait disturbance, post-stroke 05/31/2017  . Spastic hemiparesis of left nondominant side due to acute cerebral infarction ([place]) 05/31/2017  . Adjustment disorder with mixed anxiety and depressed mood   . Hypokalemia   . Flaccid monoplegia of upper extremity ([place])   . Benign essential HTN   . 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 ([place]) 04/28/2017  . 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[place]) 04/27/2017  . History of CVA (cerebrovascular accident) 04/24/2017  . Palpitations 04/23/2017  . LGSIL Pap smear of vagina 12/15/2016  . Anal skin tag 02/02/2016  . Inflammatory arthritis 01/19/2016  . Right knee pain 01/19/2016  . Genital herpes 10/20/2015  . GERD (gastroesophageal reflux disease) 10/20/2015  . Status post vaginal hysterectomy 10/20/2015  . Health care maintenance 05/30/2015  . LLQ pain 03/31/2014  . Menopausal symptoms 03/31/2014  . Generalized abdominal pain 10/05/2013  . Female stress incontinence 08/18/2013  . Incomplete emptying of bladder 08/18/2013  . Bladder pain 08/18/2013  . Hypercholesterolemia 12/31/2012  . Hypothyroidism, unspecified 12/29/2012  . Osteoarthritis 12/29/2012  . Benign neoplasm of mouth 09/14/2012  . PULMONARY NODULE 01/02/2008  . Mild depression ([place]) 12/30/2007  . Essential hypertension 12/30/2007  . Depression 12/30/2007    Camil Hausmann  PT, DPT 08/16/2019, 12:49 PM  [place] MAIN [facility] SERVICES [address] [place], [place], [address] Phone: [phone]   Fax:  [phone]  Name:  ASMI FUGERE MRN: [id number] Date of Birth: 1946/09/10

## 2019-08-21 ENCOUNTER — Encounter: Payer: Self-pay | Admitting: Physical Therapy

## 2019-08-21 ENCOUNTER — Other Ambulatory Visit: Payer: Self-pay

## 2019-08-21 ENCOUNTER — Ambulatory Visit: Payer: PPO | Admitting: Physical Therapy

## 2019-08-21 DIAGNOSIS — M6281 Muscle weakness (generalized): Secondary | ICD-10-CM

## 2019-08-21 DIAGNOSIS — R2681 Unsteadiness on feet: Secondary | ICD-10-CM

## 2019-08-21 DIAGNOSIS — R2689 Other abnormalities of gait and mobility: Secondary | ICD-10-CM

## 2019-08-21 NOTE — Therapy (Signed)
[place] MAIN [facility] SERVICES [address] [place], [place], [address] Phone: [phone]   Fax:  [phone]  Physical Therapy Treatment  Patient Details  Name: Donna Rhodes MRN: [id number] Date of Birth: 09-26-1946 Referring Provider (PT): Einar Pheasant MD   Encounter Date: 08/21/2019  PT End of Session - 08/21/19 1027    Visit Number  3    Number of Visits  17    Date for PT Re-Evaluation  10/09/19    PT Start Time  1020    PT Stop Time  1100    PT Time Calculation (min)  40 min    Equipment Utilized During Treatment  Gait belt    Activity Tolerance  Patient tolerated treatment well;No increased pain       Past Medical History:  Diagnosis Date  . Atrophic vaginitis   . Dysplasia of vagina    vin 1 - vulva and vaginal cuff  . GERD (gastroesophageal reflux disease)   . HCV infection   . HSV (herpes simplex virus) infection   . Hypercholesterolemia   . Hypertension   . Hypothyroidism   . Left-sided weakness    S/P CVA 4/18.  PT 2x/wk.  . Lung nodule    stable  . Menopausal state   . Osteoarthritis    hand involvement, cervical disc disease  . Osteoporosis   . Pelvic pain in female   . Rheumatoid arthritis ([place])   . Stroke (cerebrum) ([place]) 04/24/2017  . Thyroid nodule    pt unaware of this   . Vaginal Pap smear, abnormal    lgsil    Past Surgical History:  Procedure Laterality Date  . ADENOIDECTOMY    . APPENDECTOMY  1965  . [place]   left  . CATARACT EXTRACTION W/PHACO Right 10/27/2017   Procedure: CATARACT EXTRACTION PHACO AND INTRAOCULAR LENS PLACEMENT ([place]) RIGHT;  Surgeon: Leandrew Koyanagi, MD;  Location: [place];  Service: Ophthalmology;  Laterality: Right;  requests early (8-8:30 arrival)  . CATARACT EXTRACTION W/PHACO Left 11/10/2017   Procedure: CATARACT EXTRACTION PHACO AND INTRAOCULAR LENS PLACEMENT ([place]) LEFT;  Surgeon: Leandrew Koyanagi, MD;  Location: [place];  Service: Ophthalmology;  Laterality: Left;  . CHOLECYSTECTOMY    . [place]  . laser vein surgery    . LOOP RECORDER INSERTION N/A 09/24/2017   Procedure: LOOP RECORDER INSERTION;  Surgeon: Evans Lance, MD;  Location: [place] [facility];  Service: Cardiovascular;  Laterality: N/A;  . MANDIBLE RECONSTRUCTION  1992  . TOENAIL EXCISION  1998   several  . TONSILLECTOMY    . TUBAL LIGATION  1977  . VAGINAL HYSTERECTOMY  1983   als0- anterior/posterior colporrhaphy  . VAGINAL WOUND CLOSURE / REPAIR  1999, 2000  . VAGINAL WOUND CLOSURE / REPAIR      There were no vitals filed for this visit.  Subjective Assessment - 08/21/19 1026    Subjective  Patient reports continued soreness in LLE. She reports her left leg was sore for a few days after last session; She reports no pain currently as she put on the "linament" prior to session;    Pertinent History  73 yo Female s/p CVA 04/24/17 with left sided weakness (UE>LE); She did receive outpatient PT for several months in 2019 with good results. She reports in last few weeks she has had increased weakness in LLE and some soreness in LLE; She reports getting botox in LUE in Feb/March 2020.  She reports she hasn't been back. She has increased flexor tone in LUE with decreased ability to open hand. She is currently ambulating without AD with some gait deviations. She reports living by herself and doing the best she can to be independent. She reports working in the yard daily but has difficulty squatting having to bend at the waist often; She reports falling approximately 1 month ago in her yard; She reports being able to get up independently with difficulty; Patient expressed frustration over decreased walking ability and limited mobility;    Limitations  Walking;Standing    How long can you sit comfortably?  NA    How long can you stand comfortably?  30-45 min;    How long can you walk comfortably?  unsure    Patient Stated Goals  "I  would like to walk normal sometime maybe."    Currently in Pain?  No/denies    Multiple Pain Sites  No           TREATMENT:    Instructed patient in LE stretches: concurrent with moist heat to low back/hip for better tolerance: Hooklying: Passive single knee to chest stretch 20 sec hold x2 reps bilaterally; Passive hamstring stretch SLR 20 sec hold x2 reps LLE Passive hamstring neutral stretch with hip flexion and knee flexion/extension 5 sec hold x5 reps each LE; Passive hamstring neutral stretch with SLR with ankle DF/PF neural flossing 2x10 LLE only; PT performed passive piriformis stretch modified LLE 30 sec hold x3 reps;   Instructed patient in ankle strengthening: Seated red tband ankle DF 2x20 reps with cues to keep foot in neutral and avoid ankle IV, LLE only; Required mod VCS for proper positioning, having difficulty keeping ankle into neutral position, often falling into inversion;   Leg press: LLE only heel raises 60# x10, x15 with cues for positioning to optimize calf strengthening; Patient reports mild fatigue with advanced exercise but was able to exhibit better ROM;   Standing LLE hamstring stretch with foot on step for ankle DF calf stretch 20 sec hold x2 reps with mod VCs for proper positioning to isolate stretch;   Standing LLE terminal knee extension green tband 2x15 with min VCs for proper positioning to isolate knee ROM for better quad control for improved knee stabilization;    Seated LAQ with ankle DF LLE only x10 reps required min VCs to keep ankle DF during knee extension for better motor control and strengthening;   Gait around gym x80 feet with cues for proper gait technique including to increase DF at heel strike and improve knee extension at initial contact;   Response to treatment: Patient complained of increased soreness along left IT band. Pt tolerated stretches well, but does require min A due to hemiplegia in LUE limiting LUE ability to stretch LE.  She did report increased groin pain with supine IT band stretch, discontinued at this time;  Patient initially able to exhibit good heel strike with initial contact but with increased gait distance, reverts back to flat foot at initial contact;                         PT Education - 08/21/19 1026    Education Details  LE stretches/strengthening;    Person(s) Educated  Patient    Methods  Explanation;Verbal cues    Comprehension  Verbalized understanding;Returned demonstration;Verbal cues required;Need further instruction       PT Short Term Goals - 08/14/19 1600  PT SHORT TERM GOAL #1   Title  Patient will be adherent to HEP at least 3x a week to improve functional strength and balance for better safety at home.    Time  4    Period  Weeks    Status  New    Target Date  09/11/19      PT SHORT TERM GOAL #2   Title  Patient will deny any falls over past 4 weeks to demonstrate improved safety awareness at home and work.    Time  4    Period  Weeks    Status  New    Target Date  09/11/19        PT Long Term Goals - 08/14/19 1601      PT LONG TERM GOAL #1   Title  Patient will increase 10 meter walk test to >1.37m/s as to improve gait speed for better community ambulation and to reduce fall risk.    Time  8    Period  Weeks    Status  New    Target Date  10/09/19      PT LONG TERM GOAL #2   Title  Patient will increase six minute walk test distance to >1000 for progression to community ambulator and improve gait ability    Time  8    Period  Weeks    Status  New    Target Date  10/09/19      PT LONG TERM GOAL #3   Title  Patient will increase LLE ankle gross strength to 4+/5 as to improve functional strength for independent gait, increased standing tolerance and increased ADL ability.    Time  8    Period  Weeks    Status  New    Target Date  10/09/19      PT LONG TERM GOAL #4   Title  Patient will tolerate 5 seconds of single leg stance  without loss of balance to improve ability to get in and out of shower safely.    Time  8    Period  Weeks    Status  New    Target Date  10/09/19            Plan - 08/21/19 1050    Clinical Impression Statement  Patient continues to have slight flexor tone in LLE with flat foot and knee flexion at initial contact exhibiting slight gait deviations. Instructed patient in advanced LE stretches and strengthening exercise to facilitate better gait safety with improved ankle DF/EV and knee extension at initial contact. Patient required min-mod VCs for proper positioning and exercise technique. Patient tolerated exercise better today with less discomfort; Provided written HEP for better adherence. Patient would benefit from additional skilled PT Intervention to improve strength, balance and mobility;    Personal Factors and Comorbidities  Age;Time since onset of injury/illness/exacerbation;Comorbidity 3+    Comorbidities  HTN, past CVA with hemiplegia, chronic UE pain, high fall risk with recent falls    Examination-Activity Limitations  Caring for Others;Carry;Lift;Locomotion Level;Squat;Stairs;Stand;Transfers    Examination-Participation Restrictions  Community Activity;Shop;Volunteer;Yard Work    Stability/Clinical Decision Making  Stable/Uncomplicated    Rehab Potential  Good    PT Frequency  2x / week    PT Duration  8 weeks    PT Treatment/Interventions  Cryotherapy;Electrical Stimulation;Moist Heat;Gait training;DME Instruction;Stair training;Functional mobility training;Therapeutic activities;Therapeutic exercise;Balance training;Neuromuscular re-education;Patient/family education;Orthotic Fit/Training;Energy conservation    PT Next Visit Plan  do 6 min walk test, HEP  PT Home Exercise Plan  will address next session;    Consulted and Agree with Plan of Care  Patient       Patient will benefit from skilled therapeutic intervention in order to improve the following deficits and  impairments:  Abnormal gait, Decreased balance, Decreased endurance, Decreased mobility, Difficulty walking, Decreased activity tolerance, Decreased safety awareness  Visit Diagnosis: Muscle weakness (generalized)  Other abnormalities of gait and mobility  Unsteadiness on feet     Problem List Patient Active Problem List   Diagnosis Date Noted  . Nail abnormality 07/22/2019  . 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 Complete tear of left rotator cuff 02/28/2018  . Rotator cuff tendinitis, left 02/28/2018  . Left arm pain 11/05/2017  . Nocturia 08/13/2017  . Urge incontinence 08/13/2017  . Adhesive capsulitis of left shoulder 08/06/2017  . Increased frequency of urination 07/11/2017  . Gait disturbance, post-stroke 05/31/2017  . Spastic hemiparesis of left nondominant side due to acute cerebral infarction ([place]) 05/31/2017  . Adjustment disorder with mixed anxiety and depressed mood   . Hypokalemia   . 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 Benign essential HTN   . 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 ([place]) 04/28/2017  . 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[place]) 04/27/2017  . History of CVA (cerebrovascular accident) 04/24/2017  . Palpitations 04/23/2017  . LGSIL Pap smear of vagina 12/15/2016  . Anal skin tag 02/02/2016  . Inflammatory arthritis 01/19/2016  . Right knee pain 01/19/2016  . Genital herpes 10/20/2015  . GERD (gastroesophageal reflux disease) 10/20/2015  . Status post vaginal hysterectomy 10/20/2015  . Health care maintenance 05/30/2015  . LLQ pain 03/31/2014  . Menopausal symptoms 03/31/2014  . Generalized abdominal pain 10/05/2013  . Female stress incontinence 08/18/2013  . Incomplete emptying of bladder 08/18/2013  . Bladder pain 08/18/2013  . Hypercholesterolemia 12/31/2012  . Hypothyroidism, unspecified 12/29/2012  . Osteoarthritis 12/29/2012  . Benign neoplasm of mouth 09/14/2012  . PULMONARY NODULE 01/02/2008  . Mild  depression ([place]) 12/30/2007  . Essential hypertension 12/30/2007  . Depression 12/30/2007    Tanji Storrs PT, DPT 08/21/2019, 12:44 PM  [place] MAIN [facility] SERVICES [address] [place], [place], [address] Phone: [phone]   Fax:  [phone]  Name: Donna Rhodes MRN: [id number] Date of Birth: 05/16/1946

## 2019-08-21 NOTE — Patient Instructions (Signed)
Access Code: [email or web address]  URL: [email or web address]  Date: 08/21/2019  Prepared by: Blanche East   Exercises  Seated Ankle Dorsiflexion with Resistance - 10 reps - 3 sets - 1x daily - 7x weekly  Seated Long Arc Quad - 10 reps - 2 sets - 1x daily - 7x weekly  Standing Hamstring Stretch with Step - 3 reps - 2 sets - 20 hold - 1x daily - 7x weekly

## 2019-08-23 ENCOUNTER — Ambulatory Visit: Payer: PPO | Admitting: Physical Therapy

## 2019-08-23 ENCOUNTER — Encounter: Payer: Self-pay | Admitting: Physical Therapy

## 2019-08-23 ENCOUNTER — Ambulatory Visit
Admission: RE | Admit: 2019-08-23 | Discharge: 2019-08-23 | Disposition: A | Discharge status: Home / Self Care | Payer: PPO | Source: Ambulatory Visit | Attending: Internal Medicine | Admitting: Internal Medicine

## 2019-08-23 ENCOUNTER — Other Ambulatory Visit: Payer: Self-pay

## 2019-08-23 DIAGNOSIS — R2681 Unsteadiness on feet: Secondary | ICD-10-CM

## 2019-08-23 DIAGNOSIS — M6281 Muscle weakness (generalized): Secondary | ICD-10-CM | POA: Diagnosis not present

## 2019-08-23 DIAGNOSIS — Z1231 Encounter for screening mammogram for malignant neoplasm of breast: Secondary | ICD-10-CM | POA: Diagnosis not present

## 2019-08-23 DIAGNOSIS — R2689 Other abnormalities of gait and mobility: Secondary | ICD-10-CM

## 2019-08-23 NOTE — Therapy (Signed)
[place] MAIN [facility] SERVICES [address] [place], [place], [address] Phone: [phone]   Fax:  [phone]  Physical Therapy Treatment  Patient Details  Name: Donna Rhodes MRN: [id number] Date of Birth: [birth year] Referring Provider (PT): Einar Pheasant MD   Encounter Date: 08/23/2019  PT End of Session - 08/23/19 1026    Visit Number  4    Number of Visits  17    Date for PT Re-Evaluation  10/09/19    PT Start Time  1018    PT Stop Time  1100    PT Time Calculation (min)  42 min    Equipment Utilized During Treatment  Gait belt    Activity Tolerance  Patient tolerated treatment well;No increased pain    Behavior During Therapy  WFL for tasks assessed/performed       Past Medical History:  Diagnosis Date  . Atrophic vaginitis   . Dysplasia of vagina    vin 1 - vulva and vaginal cuff  . GERD (gastroesophageal reflux disease)   . HCV infection   . HSV (herpes simplex virus) infection   . Hypercholesterolemia   . Hypertension   . Hypothyroidism   . Left-sided weakness    S/P CVA 4/18.  PT 2x/wk.  . Lung nodule    stable  . Menopausal state   . Osteoarthritis    hand involvement, cervical disc disease  . Osteoporosis   . Pelvic pain in female   . Rheumatoid arthritis ([place])   . Stroke (cerebrum) ([place]) 04/24/2017  . Thyroid nodule    pt unaware of this   . Vaginal Pap smear, abnormal    lgsil    Past Surgical History:  Procedure Laterality Date  . ADENOIDECTOMY    . APPENDECTOMY  1965  . [place]   left  . CATARACT EXTRACTION W/PHACO Right 10/27/2017   Procedure: CATARACT EXTRACTION PHACO AND INTRAOCULAR LENS PLACEMENT ([place]) RIGHT;  Surgeon: Leandrew Koyanagi, MD;  Location: [facility];  Service: Ophthalmology;  Laterality: Right;  requests early (8-8:30 arrival)  . CATARACT EXTRACTION W/PHACO Left 11/10/2017   Procedure: CATARACT EXTRACTION PHACO AND INTRAOCULAR LENS PLACEMENT ([place]) LEFT;   Surgeon: Leandrew Koyanagi, MD;  Location: [facility];  Service: Ophthalmology;  Laterality: Left;  . CHOLECYSTECTOMY    . [place]  . laser vein surgery    . LOOP RECORDER INSERTION N/A 09/24/2017   Procedure: LOOP RECORDER INSERTION;  Surgeon: Evans Lance, MD;  Location: [facility];  Service: Cardiovascular;  Laterality: N/A;  . MANDIBLE RECONSTRUCTION  1992  . TOENAIL EXCISION  1998   several  . TONSILLECTOMY    . TUBAL LIGATION  1977  . VAGINAL HYSTERECTOMY  1983   als0- anterior/posterior colporrhaphy  . VAGINAL WOUND CLOSURE / REPAIR  1999, 2000  . VAGINAL WOUND CLOSURE / REPAIR      There were no vitals filed for this visit.  Subjective Assessment - 08/23/19 1019    Subjective  patient reports continued soreness in LLE; She reports trying the band exercises but admits that she may not have done the full 20;    Pertinent History  73 yo Female s/p CVA 04/24/17 with left sided weakness (UE>LE); She did receive outpatient PT for several months in 2019 with good results. She reports in last few weeks she has had increased weakness in LLE and some soreness in LLE; She reports getting botox in LUE in Feb/March 2020.  She reports she hasn't been back. She has increased flexor tone in LUE with decreased ability to open hand. She is currently ambulating without AD with some gait deviations. She reports living by herself and doing the best she can to be independent. She reports working in the yard daily but has difficulty squatting having to bend at the waist often; She reports falling approximately 1 month ago in her yard; She reports being able to get up independently with difficulty; Patient expressed frustration over decreased walking ability and limited mobility;    Limitations  Walking;Standing    How long can you sit comfortably?  NA    How long can you stand comfortably?  30-45 min;    How long can you walk comfortably?  unsure    Patient Stated Goals   "I would like to walk normal sometime maybe."    Currently in Pain?  Yes    Pain Score  6     Pain Location  Leg    Pain Orientation  Left;Lateral    Pain Descriptors / Indicators  Tender    Pain Type  Acute pain    Pain Onset  More than a month ago    Pain Frequency  Intermittent    Aggravating Factors   unsure    Pain Relieving Factors  heat/linament    Effect of Pain on Daily Activities  decreased activity tolerance;    Multiple Pain Sites  No         TREATMENT: Instructed patient in LE stretches: concurrent with moist heat to low back/hip for better tolerance: Hooklying: Passive single knee to chest stretch 20 sec hold x1 reps LLE Passive hamstring stretch SLR 20 sec hold x2 reps LLE Passive hamstring neutral stretch with hip flexion and knee flexion/extension 5 sec hold x5 reps each LE; Passive hamstring neutral stretch with SLR with ankle DF/PF neural flossing 2x10 LLE only; PT performed passive piriformis stretch modified LLE 30 sec hold x3 reps;  SAQ LLE with ankle DF 2x10 with min VCS for proper positioning and to avoid ankle IV for better motor control to reduce discomfort;   Instructed patient in ankle strengthening: Seated red tband ankle DF 2x20 reps with cues to keep foot in neutral and avoid ankle IV, LLE only; Seated red tband ankle EV 2x20 with min VCs for proper positioning to isolate ankle EV for better ankle control;   Required mod VCS for proper positioning, having difficulty keeping ankle into neutral position, often falling into inversion;   Standing LLE hamstring stretch with foot on step for ankle DF calf stretch 20 sec hold x2 reps with mod VCs for proper positioning to isolate stretch;    NMR: Instructed patient in SLS on firm surface 10 sec hold x3 sets with min A; patient exhibits decreased weight shift to LLE requiring cues for proper weight shift and stance control; Instructed patient in safe SLS exercise for HEP with using rail assist  intermittently x3 reps, 3-5 sec each; Advanced HEP, see patient instructions;  Gait around gym x80 feet with cues for proper gait technique including to increase DF at heel strike and improve knee extension at initial contact;   Response to treatment: Patient complained of increased soreness along left IT band. Pt tolerated stretches well, but does require min A due to hemiplegia in LUE limiting LUE ability to stretch LE.   Patient initially able to exhibit good heel strike with initial contact but with increased gait distance, reverts back to flat  foot at initial contact;She did have increased difficulty with SLS tasks due to impaired weight shift and weakness in LE. Verbalized/demonstrated understanding of HEP;                      PT Education - 08/23/19 1026    Education Details  LE stretches/strengthening;    Person(s) Educated  Patient    Methods  Explanation;Verbal cues    Comprehension  Verbalized understanding;Returned demonstration;Verbal cues required;Need further instruction       PT Short Term Goals - 08/14/19 1600      PT SHORT TERM GOAL #1   Title  Patient will be adherent to HEP at least 3x a week to improve functional strength and balance for better safety at home.    Time  4    Period  Weeks    Status  New    Target Date  09/11/19      PT SHORT TERM GOAL #2   Title  Patient will deny any falls over past 4 weeks to demonstrate improved safety awareness at home and work.    Time  4    Period  Weeks    Status  New    Target Date  09/11/19        PT Long Term Goals - 08/14/19 1601      PT LONG TERM GOAL #1   Title  Patient will increase 10 meter walk test to >1.24m/s as to improve gait speed for better community ambulation and to reduce fall risk.    Time  8    Period  Weeks    Status  New    Target Date  10/09/19      PT LONG TERM GOAL #2   Title  Patient will increase six minute walk test distance to >1000 for progression to community  ambulator and improve gait ability    Time  8    Period  Weeks    Status  New    Target Date  10/09/19      PT LONG TERM GOAL #3   Title  Patient will increase LLE ankle gross strength to 4+/5 as to improve functional strength for independent gait, increased standing tolerance and increased ADL ability.    Time  8    Period  Weeks    Status  New    Target Date  10/09/19      PT LONG TERM GOAL #4   Title  Patient will tolerate 5 seconds of single leg stance without loss of balance to improve ability to get in and out of shower safely.    Time  8    Period  Weeks    Status  New    Target Date  10/09/19            Plan - 08/23/19 1242    Clinical Impression Statement  Patient motivated and tolerated session well; She does continue to have weakness in LLE which limits ankle control and knee control in stance. Advanced HEP with SLS to challenge balance and strength with weight bearing. patient does require min Vcs for proper positioning and exercise technique to isolate proper musculature for optimal strengthening; patient would benefit from additional skilled PT intervention to improve strength, balance and gait safety;    Personal Factors and Comorbidities  Age;Time since onset of injury/illness/exacerbation;Comorbidity 3+    Comorbidities  HTN, past CVA with hemiplegia, chronic UE pain, high fall risk with recent falls  Examination-Activity Limitations  Caring for Others;Carry;Lift;Locomotion Level;Squat;Stairs;Stand;Transfers    Examination-Participation Restrictions  Community Activity;Shop;Volunteer;Yard Work    Stability/Clinical Decision Making  Stable/Uncomplicated    Rehab Potential  Good    PT Frequency  2x / week    PT Duration  8 weeks    PT Treatment/Interventions  Cryotherapy;Electrical Stimulation;Moist Heat;Gait training;DME Instruction;Stair training;Functional mobility training;Therapeutic activities;Therapeutic exercise;Balance training;Neuromuscular  re-education;Patient/family education;Orthotic Fit/Training;Energy conservation    PT Next Visit Plan  do 6 min walk test, HEP    PT Home Exercise Plan  will address next session;    Consulted and Agree with Plan of Care  Patient       Patient will benefit from skilled therapeutic intervention in order to improve the following deficits and impairments:  Abnormal gait, Decreased balance, Decreased endurance, Decreased mobility, Difficulty walking, Decreased activity tolerance, Decreased safety awareness  Visit Diagnosis: Muscle weakness (generalized)  Other abnormalities of gait and mobility  Unsteadiness on feet     Problem List Patient Active Problem List   Diagnosis Date Noted  . Nail abnormality 07/22/2019  . 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 Complete tear of left rotator cuff 02/28/2018  . Rotator cuff tendinitis, left 02/28/2018  . Left arm pain 11/05/2017  . Nocturia 08/13/2017  . Urge incontinence 08/13/2017  . Adhesive capsulitis of left shoulder 08/06/2017  . Increased frequency of urination 07/11/2017  . Gait disturbance, post-stroke 05/31/2017  . Spastic hemiparesis of left nondominant side due to acute cerebral infarction ([place]) 05/31/2017  . Adjustment disorder with mixed anxiety and depressed mood   . Hypokalemia   . 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 Benign essential HTN   . 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 ([place]) 04/28/2017  . 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[place]) 04/27/2017  . History of CVA (cerebrovascular accident) 04/24/2017  . Palpitations 04/23/2017  . LGSIL Pap smear of vagina 12/15/2016  . Anal skin tag 02/02/2016  . Inflammatory arthritis 01/19/2016  . Right knee pain 01/19/2016  . Genital herpes 10/20/2015  . GERD (gastroesophageal reflux disease) 10/20/2015  . Status post vaginal hysterectomy 10/20/2015  . Health care maintenance 05/30/2015  . LLQ pain 03/31/2014  . Menopausal symptoms 03/31/2014  .  Generalized abdominal pain 10/05/2013  . Female stress incontinence 08/18/2013  . Incomplete emptying of bladder 08/18/2013  . Bladder pain 08/18/2013  . Hypercholesterolemia 12/31/2012  . Hypothyroidism, unspecified 12/29/2012  . Osteoarthritis 12/29/2012  . Benign neoplasm of mouth 09/14/2012  . PULMONARY NODULE 01/02/2008  . Mild depression ([place]) 12/30/2007  . Essential hypertension 12/30/2007  . Depression 12/30/2007    Trotter,Margaret PT, DPT 08/23/2019, 12:47 PM  [place] MAIN [facility] SERVICES [address] [place], [place], [address] Phone: [phone]   Fax:  [phone]  Name: Donna Rhodes MRN: [id number] Date of Birth: July 23, 1946

## 2019-08-23 NOTE — Patient Instructions (Signed)
Access Code: [email or web address]  URL: [email or web address]  Date: 08/23/2019  Prepared by: Blanche East   Exercises  Standing Single Leg Stance with Unilateral Counter Support - 5 reps - 1 sets - 10 hold - 1x daily - 7x weekly

## 2019-08-28 ENCOUNTER — Other Ambulatory Visit: Payer: Self-pay

## 2019-08-28 ENCOUNTER — Other Ambulatory Visit (INDEPENDENT_AMBULATORY_CARE_PROVIDER_SITE_OTHER): Payer: PPO

## 2019-08-28 DIAGNOSIS — D649 Anemia, unspecified: Secondary | ICD-10-CM

## 2019-08-28 DIAGNOSIS — E039 Hypothyroidism, unspecified: Secondary | ICD-10-CM | POA: Diagnosis not present

## 2019-08-28 LAB — CBC WITH DIFFERENTIAL/PLATELET
Basophils Absolute: 0.1 10*3/uL (ref 0.0–0.1)
Basophils Relative: 1.2 % (ref 0.0–3.0)
Eosinophils Absolute: 0.1 10*3/uL (ref 0.0–0.7)
Eosinophils Relative: 2.3 % (ref 0.0–5.0)
HCT: 35.8 % — ABNORMAL LOW (ref 36.0–46.0)
Hemoglobin: 11.8 g/dL — ABNORMAL LOW (ref 12.0–15.0)
Lymphocytes Relative: 30 % (ref 12.0–46.0)
Lymphs Abs: 1.4 10*3/uL (ref 0.7–4.0)
MCHC: 32.9 g/dL (ref 30.0–36.0)
MCV: 92.5 fl (ref 78.0–100.0)
Monocytes Absolute: 0.6 10*3/uL (ref 0.1–1.0)
Monocytes Relative: 11.8 % (ref 3.0–12.0)
Neutro Abs: 2.6 10*3/uL (ref 1.4–7.7)
Neutrophils Relative %: 54.7 % (ref 43.0–77.0)
Platelets: 227 10*3/uL (ref 150.0–400.0)
RBC: 3.87 Mil/uL (ref 3.87–5.11)
RDW: 13.9 % (ref 11.5–15.5)
WBC: 4.8 10*3/uL (ref 4.0–10.5)

## 2019-08-28 LAB — IBC + FERRITIN
Ferritin: 51.9 ng/mL (ref 10.0–291.0)
Iron: 67 ug/dL (ref 42–145)
Saturation Ratios: 21.6 % (ref 20.0–50.0)
Transferrin: 222 mg/dL (ref 212.0–360.0)

## 2019-08-28 LAB — VITAMIN B12: Vitamin B-12: 357 pg/mL (ref 211–911)

## 2019-08-28 LAB — TSH: TSH: 0.07 u[IU]/mL — ABNORMAL LOW (ref 0.35–4.50)

## 2019-08-29 ENCOUNTER — Ambulatory Visit: Payer: PPO | Admitting: Occupational Therapy

## 2019-08-29 ENCOUNTER — Other Ambulatory Visit: Payer: Self-pay | Admitting: Internal Medicine

## 2019-08-29 ENCOUNTER — Ambulatory Visit: Payer: PPO | Attending: Internal Medicine | Admitting: Physical Therapy

## 2019-08-29 ENCOUNTER — Encounter: Payer: Self-pay | Admitting: Physical Therapy

## 2019-08-29 DIAGNOSIS — M6281 Muscle weakness (generalized): Secondary | ICD-10-CM | POA: Insufficient documentation

## 2019-08-29 DIAGNOSIS — R2689 Other abnormalities of gait and mobility: Secondary | ICD-10-CM | POA: Diagnosis not present

## 2019-08-29 DIAGNOSIS — R2681 Unsteadiness on feet: Secondary | ICD-10-CM | POA: Diagnosis not present

## 2019-08-29 DIAGNOSIS — E039 Hypothyroidism, unspecified: Secondary | ICD-10-CM

## 2019-08-29 NOTE — Therapy (Signed)
[place] MAIN [facility] SERVICES [place], [place], [address] Phone: [phone]   Fax:  [phone]  Physical Therapy Treatment  Patient Details  Name: Donna Rhodes MRN: [id number] Date of Birth: 13-Aug-1946 Referring Provider (PT): Einar Pheasant MD   Encounter Date: 08/29/2019  PT End of Session - 08/29/19 1106    Visit Number  5    Number of Visits  17    Date for PT Re-Evaluation  10/09/19    PT Start Time  1102    PT Stop Time  1145    PT Time Calculation (min)  43 min    Equipment Utilized During Treatment  Gait belt    Activity Tolerance  Patient tolerated treatment well;No increased pain    Behavior During Therapy  WFL for tasks assessed/performed       Past Medical History:  Diagnosis Date  . Atrophic vaginitis   . Dysplasia of vagina    vin 1 - vulva and vaginal cuff  . GERD (gastroesophageal reflux disease)   . HCV infection   . HSV (herpes simplex virus) infection   . Hypercholesterolemia   . Hypertension   . Hypothyroidism   . Left-sided weakness    S/P CVA 4/18.  PT 2x/wk.  . Lung nodule    stable  . Menopausal state   . Osteoarthritis    hand involvement, cervical disc disease  . Osteoporosis   . Pelvic pain in female   . Rheumatoid arthritis ([place])   . Stroke (cerebrum) ([place]) 04/24/2017  . Thyroid nodule    pt unaware of this   . Vaginal Pap smear, abnormal    lgsil    Past Surgical History:  Procedure Laterality Date  . ADENOIDECTOMY    . APPENDECTOMY  1965  . [place]   left  . CATARACT EXTRACTION W/PHACO Right 10/27/2017   Procedure: CATARACT EXTRACTION PHACO AND INTRAOCULAR LENS PLACEMENT ([place]) RIGHT;  Surgeon: Leandrew Koyanagi, MD;  Location: [place];  Service: Ophthalmology;  Laterality: Right;  requests early (8-8:30 arrival)  . CATARACT EXTRACTION W/PHACO Left 11/10/2017   Procedure: CATARACT EXTRACTION PHACO AND INTRAOCULAR LENS PLACEMENT ([place]) LEFT;   Surgeon: Leandrew Koyanagi, MD;  Location: [place];  Service: Ophthalmology;  Laterality: Left;  . CHOLECYSTECTOMY    . [place]  . laser vein surgery    . LOOP RECORDER INSERTION N/A 09/24/2017   Procedure: LOOP RECORDER INSERTION;  Surgeon: Evans Lance, MD;  Location: [place] [facility];  Service: Cardiovascular;  Laterality: N/A;  . MANDIBLE RECONSTRUCTION  1992  . TOENAIL EXCISION  1998   several  . TONSILLECTOMY    . TUBAL LIGATION  1977  . VAGINAL HYSTERECTOMY  1983   als0- anterior/posterior colporrhaphy  . VAGINAL WOUND CLOSURE / REPAIR  1999, 2000  . VAGINAL WOUND CLOSURE / REPAIR      There were no vitals filed for this visit.  Subjective Assessment - 08/29/19 1105    Subjective  Patient reports doing well; She reports less pain in LLE and states that she has been walking a little better; She reports working on HEP with good results;    Pertinent History  73 yo Female s/p CVA 04/24/17 with left sided weakness (UE>LE); She did receive outpatient PT for several months in 2019 with good results. She reports in last few weeks she has had increased weakness in LLE and some soreness in LLE; She reports getting botox  in LUE in Feb/March 2020. She reports she hasn't been back. She has increased flexor tone in LUE with decreased ability to open hand. She is currently ambulating without AD with some gait deviations. She reports living by herself and doing the best she can to be independent. She reports working in the yard daily but has difficulty squatting having to bend at the waist often; She reports falling approximately 1 month ago in her yard; She reports being able to get up independently with difficulty; Patient expressed frustration over decreased walking ability and limited mobility;    Limitations  Walking;Standing    How long can you sit comfortably?  NA    How long can you stand comfortably?  30-45 min;    How long can you walk comfortably?  unsure     Patient Stated Goals  "I would like to walk normal sometime maybe."    Currently in Pain?  No/denies    Multiple Pain Sites  No            TREATMENT: Instructed patient in LE stretches: Hooklying: Passive single knee to chest stretch 20 sec hold x1 reps LLE Passive hamstring stretch SLR 20 sec hold x2 reps LLE Passive hamstring neutral stretch with hip flexion and knee flexion/extension 5 sec hold x5 reps each LE; Passive hamstring neutral stretch with SLR with ankle DF/PF neural flossing 2x10 LLE only;  SAQ LLE with ankle DF 2x15 with soccer ball under LLE knee with min VCS for proper positioning and to avoid ankle IV for better motor control to reduce discomfort;   Instructed patient in ankle strengthening: Seated red tband ankle DF x20 reps with cues to keep foot in neutral and avoid ankle IV, LLE only; Seated red tband ankle EV x20 with min VCs for proper positioning to isolate ankle EV for better ankle control;  Required mod VCS for proper positioning, having difficulty keeping ankle into neutral position, often falling into inversion;   Standing LLE hamstring stretch with foot on step for ankle DF calf stretch 20 sec hold x2 reps with mod VCs for proper positioning to isolate stretch;  Standing on single leg, contralateral leg star kicks red tband x5 reps each foot; required cues for proper weight shift to facilitate better hip strengthening;   Standing eccentric step down heel strike x5 reps each LE with B rail assist with min VCs for proper positioning and to avoid ankle IV on LLE for better motor control;   NMR: Instructed patient in BAPs board, LLE only level 3 DF/PF, IV/EV x10 reps each with cues to isolate ankle ROM for better ankle stabilization;   Instructed patient in SLS on firm surface with LLE with RLE on ball circles, patient unable to complete without rail assist having significant difficulty due to decreased weight shift to LLE;  Standing one foot  on airex, one foot on 4 inch step, unsupported, head turns side/side x5 reps each foot on step to challenge stance control and weight shift; Patient exhibits improved ability standing on RLE; she had increased difficulty on LLE;    Gait around gym x80 feet with cues for proper gait technique including to increase DF at heel strike and improve knee extension at initial contact;  Response to treatment: Patient instructed in advanced LE strengthening, focusing on ankle/knee control; She required mod Vcs for proper muscle activation for better motor control; Patient instructed in advanced balance tasks to improve weight shift to LLE. She fatigues quickly with  Prolonged weight shift  requiring short seated rest break; Patient able to exhibit improved foot clearance with gait towards end of session with better heel strike; She reports no increase in LLE pain with advanced exercise;                      PT Education - 08/29/19 1106    Education Details  LE stretches/strengthening;    Person(s) Educated  Patient    Methods  Explanation;Verbal cues    Comprehension  Verbalized understanding;Returned demonstration;Verbal cues required;Need further instruction       PT Short Term Goals - 08/14/19 1600      PT SHORT TERM GOAL #1   Title  Patient will be adherent to HEP at least 3x a week to improve functional strength and balance for better safety at home.    Time  4    Period  Weeks    Status  New    Target Date  09/11/19      PT SHORT TERM GOAL #2   Title  Patient will deny any falls over past 4 weeks to demonstrate improved safety awareness at home and work.    Time  4    Period  Weeks    Status  New    Target Date  09/11/19        PT Long Term Goals - 08/14/19 1601      PT LONG TERM GOAL #1   Title  Patient will increase 10 meter walk test to >1.31m/s as to improve gait speed for better community ambulation and to reduce fall risk.    Time  8    Period  Weeks     Status  New    Target Date  10/09/19      PT LONG TERM GOAL #2   Title  Patient will increase six minute walk test distance to >1000 for progression to community ambulator and improve gait ability    Time  8    Period  Weeks    Status  New    Target Date  10/09/19      PT LONG TERM GOAL #3   Title  Patient will increase LLE ankle gross strength to 4+/5 as to improve functional strength for independent gait, increased standing tolerance and increased ADL ability.    Time  8    Period  Weeks    Status  New    Target Date  10/09/19      PT LONG TERM GOAL #4   Title  Patient will tolerate 5 seconds of single leg stance without loss of balance to improve ability to get in and out of shower safely.    Time  8    Period  Weeks    Status  New    Target Date  10/09/19            Plan - 08/29/19 1153    Clinical Impression Statement  Patient motivated and participated well within session; Instructed patient in advanced strengthening focusing on improving motor control in LLE to isolate ankle and knee stabilization with advanced exercise. Instructed patient in advanced balance exercise to challenge SLS control on LLE; Patient requires min VCs for proper positioning and weight shift for better stance control. She does fatigue quickly with prolonged standing. Patient would benefit from additional skilled PT Intervention to improve strength, balance and gait safety;    Personal Factors and Comorbidities  Age;Time since onset of injury/illness/exacerbation;Comorbidity 3+    Comorbidities  HTN, past CVA with hemiplegia, chronic UE pain, high fall risk with recent falls    Examination-Activity Limitations  Caring for Others;Carry;Lift;Locomotion Level;Squat;Stairs;Stand;Transfers    Examination-Participation Restrictions  Community Activity;Shop;Volunteer;Yard Work    Stability/Clinical Decision Making  Stable/Uncomplicated    Rehab Potential  Good    PT Frequency  2x / week    PT Duration  8  weeks    PT Treatment/Interventions  Cryotherapy;Electrical Stimulation;Moist Heat;Gait training;DME Instruction;Stair training;Functional mobility training;Therapeutic activities;Therapeutic exercise;Balance training;Neuromuscular re-education;Patient/family education;Orthotic Fit/Training;Energy conservation    PT Next Visit Plan  do 6 min walk test, HEP    PT Home Exercise Plan  will address next session;    Consulted and Agree with Plan of Care  Patient       Patient will benefit from skilled therapeutic intervention in order to improve the following deficits and impairments:  Abnormal gait, Decreased balance, Decreased endurance, Decreased mobility, Difficulty walking, Decreased activity tolerance, Decreased safety awareness  Visit Diagnosis: Muscle weakness (generalized)  Other abnormalities of gait and mobility  Unsteadiness on feet     Problem List Patient Active Problem List   Diagnosis Date Noted  . Nail abnormality 07/22/2019  . 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 Complete tear of left rotator cuff 02/28/2018  . Rotator cuff tendinitis, left 02/28/2018  . Left arm pain 11/05/2017  . Nocturia 08/13/2017  . Urge incontinence 08/13/2017  . Adhesive capsulitis of left shoulder 08/06/2017  . Increased frequency of urination 07/11/2017  . Gait disturbance, post-stroke 05/31/2017  . Spastic hemiparesis of left nondominant side due to acute cerebral infarction ([place]) 05/31/2017  . Adjustment disorder with mixed anxiety and depressed mood   . Hypokalemia   . 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 Benign essential HTN   . 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 ([place]) 04/28/2017  . 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[place]) 04/27/2017  . History of CVA (cerebrovascular accident) 04/24/2017  . Palpitations 04/23/2017  . LGSIL Pap smear of vagina 12/15/2016  . Anal skin tag 02/02/2016  . Inflammatory arthritis 01/19/2016  . Right knee pain 01/19/2016  .  Genital herpes 10/20/2015  . GERD (gastroesophageal reflux disease) 10/20/2015  . Status post vaginal hysterectomy 10/20/2015  . Health care maintenance 05/30/2015  . LLQ pain 03/31/2014  . Menopausal symptoms 03/31/2014  . Generalized abdominal pain 10/05/2013  . Female stress incontinence 08/18/2013  . Incomplete emptying of bladder 08/18/2013  . Bladder pain 08/18/2013  . Hypercholesterolemia 12/31/2012  . Hypothyroidism, unspecified 12/29/2012  . Osteoarthritis 12/29/2012  . Benign neoplasm of mouth 09/14/2012  . PULMONARY NODULE 01/02/2008  . Mild depression ([place]) 12/30/2007  . Essential hypertension 12/30/2007  . Depression 12/30/2007    Pastor Sgro PT, DPT 08/29/2019, 11:56 AM  [place] MAIN [facility] SERVICES [address] [place], [place], [address] Phone: [phone]   Fax:  [phone]  Name: ELZENA STOFFERS MRN: [id number] Date of Birth: Mar 16, 1946

## 2019-08-29 NOTE — Therapy (Signed)
[place] MAIN [facility] SERVICES [address] [place], [place], [address] Phone: [phone]   Fax:  [phone]  Patient Details  Name: Donna Rhodes MRN: [id number] Date of Birth: 11-Feb-1946 Referring Provider:  Einar Pheasant, MD  Encounter Date: 08/29/2019   Pt. was in for an OT screen today. Pt. has had no change in the LUE, or ADL/IADL functioning warranting skilled OT intervention at this time. Pt.continues to attempt to engage her LUE during ADL tasks intermittently throughout the day. Pt. continues to use her right hand to assist with ROM of her LUE, and hand. Pt. is independently able to reposition her LUE, and hand as needed. Pt. continues to present with flexor synergistic movement. Pt. reports that her last Botox injection for her LUE was in March, and has no plans for additional injections. Pt. Continues to present with arthritis, and a history of left shoulder injury/limitations with continues to limit her. Pt.reports that her mother is now living with her mother, and reports independence with daily ADL, and IADLtasks, including driving with vehicle modification, shopping, and light meal preparation using mostly her RUE. Pt. continues to try to engage her LUE during daily tasks, and utilizes techniques, positioning, and ROM reviewed when pt. was actively receiving OT services. Will continue to monitor, and screen as appropriate.   Harrel Carina, MS, OTR/L 08/29/2019, 12:15 PM  [place] MAIN [facility] SERVICES [address] [place], [place], [address] Phone: [phone]   Fax:  [phone]

## 2019-08-29 NOTE — Progress Notes (Signed)
Order placed for f/u tsh.  

## 2019-08-31 ENCOUNTER — Other Ambulatory Visit: Payer: PPO

## 2019-09-01 ENCOUNTER — Telehealth: Payer: Self-pay

## 2019-09-01 ENCOUNTER — Other Ambulatory Visit: Payer: Self-pay | Admitting: Internal Medicine

## 2019-09-01 NOTE — Telephone Encounter (Signed)
Copied from [place] [id number]. Topic: General - Other >> Sep 01, 2019  3:59 PM Mathis Bud wrote: Reason for CRM: Patient would like to nurse to call for lab results  Call back # [phone]

## 2019-09-05 ENCOUNTER — Other Ambulatory Visit: Payer: Self-pay

## 2019-09-05 MED ORDER — LEVOTHYROXINE SODIUM 50 MCG PO TABS: 50.0000 ug | ORAL_TABLET | Freq: Every day | ORAL | 1 refills | Status: DC

## 2019-09-05 NOTE — Telephone Encounter (Signed)
Pt aware.

## 2019-09-05 NOTE — Telephone Encounter (Signed)
rx resent  

## 2019-09-05 NOTE — Telephone Encounter (Signed)
Patient called in stating she spoke with a nurse Friday, saying that she needed to go down to a levothyroxine (SYNTHROID) 50 MG, instead of current medication and none has been called in yet. Patient is irate as she thought this would be taken care of by now. Please advise as patient is completely out of medication and was given 5 pills from pharmacy to hold her over till today.

## 2019-09-06 ENCOUNTER — Ambulatory Visit: Payer: PPO | Admitting: Physical Therapy

## 2019-09-11 ENCOUNTER — Ambulatory Visit: Payer: PPO | Admitting: Physical Therapy

## 2019-09-11 ENCOUNTER — Other Ambulatory Visit: Payer: Self-pay

## 2019-09-11 ENCOUNTER — Encounter: Payer: Self-pay | Admitting: Physical Therapy

## 2019-09-11 DIAGNOSIS — M6281 Muscle weakness (generalized): Secondary | ICD-10-CM | POA: Diagnosis not present

## 2019-09-11 DIAGNOSIS — R2689 Other abnormalities of gait and mobility: Secondary | ICD-10-CM

## 2019-09-11 DIAGNOSIS — R2681 Unsteadiness on feet: Secondary | ICD-10-CM

## 2019-09-11 NOTE — Therapy (Signed)
[place] MAIN [facility] SERVICES [address] [place], [place], [address] Phone: [phone]   Fax:  [phone]  Physical Therapy Treatment/Discharge Summary  Patient Details  Name: Donna Rhodes MRN: [id number] Date of Birth: Mar 01, 1946 Referring Provider (PT): Einar Pheasant MD   Encounter Date: 09/11/2019  PT End of Session - 09/11/19 1115    Visit Number  6    Number of Visits  17    Date for PT Re-Evaluation  10/09/19    PT Start Time  1105    PT Stop Time  1135    PT Time Calculation (min)  30 min    Equipment Utilized During Treatment  Gait belt    Activity Tolerance  Patient tolerated treatment well;No increased pain    Behavior During Therapy  WFL for tasks assessed/performed       Past Medical History:  Diagnosis Date  . Atrophic vaginitis   . Dysplasia of vagina    vin 1 - vulva and vaginal cuff  . GERD (gastroesophageal reflux disease)   . HCV infection   . HSV (herpes simplex virus) infection   . Hypercholesterolemia   . Hypertension   . Hypothyroidism   . Left-sided weakness    S/P CVA 4/18.  PT 2x/wk.  . Lung nodule    stable  . Menopausal state   . Osteoarthritis    hand involvement, cervical disc disease  . Osteoporosis   . Pelvic pain in female   . Rheumatoid arthritis ([place])   . Stroke (cerebrum) ([place]) 04/24/2017  . Thyroid nodule    pt unaware of this   . Vaginal Pap smear, abnormal    lgsil    Past Surgical History:  Procedure Laterality Date  . ADENOIDECTOMY    . APPENDECTOMY  1965  . [place]   left  . CATARACT EXTRACTION W/PHACO Right 10/27/2017   Procedure: CATARACT EXTRACTION PHACO AND INTRAOCULAR LENS PLACEMENT ([place]) RIGHT;  Surgeon: Leandrew Koyanagi, MD;  Location: [place];  Service: Ophthalmology;  Laterality: Right;  requests early (8-8:30 arrival)  . CATARACT EXTRACTION W/PHACO Left 11/10/2017   Procedure: CATARACT EXTRACTION PHACO AND INTRAOCULAR LENS  PLACEMENT ([place]) LEFT;  Surgeon: Leandrew Koyanagi, MD;  Location: [facility];  Service: Ophthalmology;  Laterality: Left;  . CHOLECYSTECTOMY    . [place]  . laser vein surgery    . LOOP RECORDER INSERTION N/A 09/24/2017   Procedure: LOOP RECORDER INSERTION;  Surgeon: Evans Lance, MD;  Location: [place] [facility];  Service: Cardiovascular;  Laterality: N/A;  . MANDIBLE RECONSTRUCTION  1992  . TOENAIL EXCISION  1998   several  . TONSILLECTOMY    . TUBAL LIGATION  1977  . VAGINAL HYSTERECTOMY  1983   als0- anterior/posterior colporrhaphy  . VAGINAL WOUND CLOSURE / REPAIR  1999, 2000  . VAGINAL WOUND CLOSURE / REPAIR      There were no vitals filed for this visit.  Subjective Assessment - 09/11/19 1113    Subjective  Patient reports difficulty with maintaining proper gait technique unless she concentrates on every step; has not maintained HEP every day. reports increased fatigue and states that she is generally overwhelmed and not sure how much longer she wishes to continue.    Pertinent History  73 yo Female s/p CVA 04/24/17 with left sided weakness (UE>LE); She did receive outpatient PT for several months in 2019 with good results. She reports in last few weeks she has had  increased weakness in LLE and some soreness in LLE; She reports getting botox in LUE in Feb/March 2020. She reports she hasn't been back. She has increased flexor tone in LUE with decreased ability to open hand. She is currently ambulating without AD with some gait deviations. She reports living by herself and doing the best she can to be independent. She reports working in the yard daily but has difficulty squatting having to bend at the waist often; She reports falling approximately 1 month ago in her yard; She reports being able to get up independently with difficulty; Patient expressed frustration over decreased walking ability and limited mobility;    Limitations  Walking;Standing    How long  can you sit comfortably?  NA    How long can you stand comfortably?  30-45 min;    How long can you walk comfortably?  unsure    Patient Stated Goals  "I would like to walk normal sometime maybe."    Currently in Pain?  No/denies    Multiple Pain Sites  No         [facility] PT Assessment - 09/11/19 0001      Standardized Balance Assessment   10 Meter Walk  0.875ms; improved compared to initial assessment of 0.79 m/s on 08/17; however, patient continues to be at high risk of fallls.          High Level Balance   High Level Balance Comments  12 sec SLS on right LE; 2 sec SLS on left LE.         TREATMENT: Re-instructed patient in HEP; Instructed patient in left ankle eversion and dorsiflexion red band exercises, x20 reps each.  LAQ with dorsiflexion x10 LLE; cues to limit foot inversion during concentric phase of dorsiflexion.   Instructed patient in outcome measures to assess goals, see above.   Response to treatment: Patient complained of increased fatigue and frustration over lack of improvement in gait ability. She expressed that she continues to have to concentrate on gait pattern that its doesn't come naturally and she is frustrated that her foot continues to turn in when walking. PT assessed patient's HEP adherence. She admits that she doesn't do all her exercises every day from other time commitments and distractions. Patient expressed some concern moving forward as she has an upcoming surgery and has some family commitments that are monopolizing her time. Recommend pt be discharged at this time. Re-instructed patient in HEP with instruction to increase adherence when possible. Patient expressed she may like to return in the future after her surgery and when things calm down.                         PT Education - 09/11/19 1141    Education Details  HEP    Person(s) Educated  Patient    Methods  Explanation    Comprehension  Verbalized understanding        PT Short Term Goals - 09/11/19 1126      PT SHORT TERM GOAL #1   Title  Patient will be adherent to HEP at least 3x a week to improve functional strength and balance for better safety at home.    Time  4    Period  Weeks    Status  Not Met    Target Date  09/11/19      PT SHORT TERM GOAL #2   Title  Patient will deny any falls over past 4 weeks  to demonstrate improved safety awareness at home and work.    Time  4    Period  Weeks    Status  Not Met    Target Date  09/11/19        PT Long Term Goals - 09/11/19 1126      PT LONG TERM GOAL #1   Title  Patient will increase 10 meter walk test to >1.42ms as to improve gait speed for better community ambulation and to reduce fall risk.    Time  8    Period  Weeks    Status  Partially Met    Target Date  10/09/19      PT LONG TERM GOAL #2   Title  Patient will increase six minute walk test distance to >1000 for progression to community ambulator and improve gait ability    Time  8    Period  Weeks    Status  Partially Met    Target Date  10/09/19      PT LONG TERM GOAL #3   Title  Patient will increase LLE ankle gross strength to 4+/5 as to improve functional strength for independent gait, increased standing tolerance and increased ADL ability.    Time  8    Period  Weeks    Status  Partially Met    Target Date  10/09/19      PT LONG TERM GOAL #4   Title  Patient will tolerate 5 seconds of single leg stance without loss of balance to improve ability to get in and out of shower safely.    Time  8    Period  Weeks    Status  Partially Met    Target Date  10/09/19            Plan - 09/11/19 1241    Clinical Impression Statement  Patient presents to therapy with increased fatigue and feeling overwhelmed with family issues and upcoming surgery. She is supposed to have eye surgery in October for Basal Cell Carcinoma. Patient continues to be frustrated over lack of foot clearance on left side with increased flexor  synergy pattern at initial contact. Patient admits that she hasn't been doing as much HEP due to other committments and frustration. She does exhibit slight improvement in gait speed although continues to have left foot drag. Patient is requesting to stop therapy at this time due to fatigue and time comittments with her upcoming surgery. Patient has a HEP and verbalized understanding. Patient expressed that she may want to resume therapy in the future after her eye surgery and after her family life settles down.    Personal Factors and Comorbidities  Age;Time since onset of injury/illness/exacerbation;Comorbidity 3+    Comorbidities  HTN, past CVA with hemiplegia, chronic UE pain, high fall risk with recent falls    Examination-Activity Limitations  Caring for Others;Carry;Lift;Locomotion Level;Squat;Stairs;Stand;Transfers    Examination-Participation Restrictions  Community Activity;Shop;Volunteer;Yard Work    Stability/Clinical Decision Making  Stable/Uncomplicated    Rehab Potential  Good    PT Frequency  2x / week    PT Duration  8 weeks    PT Treatment/Interventions  Cryotherapy;Electrical Stimulation;Moist Heat;Gait training;DME Instruction;Stair training;Functional mobility training;Therapeutic activities;Therapeutic exercise;Balance training;Neuromuscular re-education;Patient/family education;Orthotic Fit/Training;Energy conservation    PT Next Visit Plan  do 6 min walk test, HEP    PT Home Exercise Plan  will address next session;    Consulted and Agree with Plan of Care  Patient  Patient will benefit from skilled therapeutic intervention in order to improve the following deficits and impairments:  Abnormal gait, Decreased balance, Decreased endurance, Decreased mobility, Difficulty walking, Decreased activity tolerance, Decreased safety awareness  Visit Diagnosis: Muscle weakness (generalized)  Other abnormalities of gait and mobility  Unsteadiness on feet     Problem  List Patient Active Problem List   Diagnosis Date Noted  . Nail abnormality 07/22/2019  . 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 Complete tear of left rotator cuff 02/28/2018  . Rotator cuff tendinitis, left 02/28/2018  . Left arm pain 11/05/2017  . Nocturia 08/13/2017  . Urge incontinence 08/13/2017  . Adhesive capsulitis of left shoulder 08/06/2017  . Increased frequency of urination 07/11/2017  . Gait disturbance, post-stroke 05/31/2017  . Spastic hemiparesis of left nondominant side due to acute cerebral infarction ([place]) 05/31/2017  . Adjustment disorder with mixed anxiety and depressed mood   . Hypokalemia   . 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 Benign essential HTN   . 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 ([place]) 04/28/2017  . 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[place]) 04/27/2017  . History of CVA (cerebrovascular accident) 04/24/2017  . Palpitations 04/23/2017  . LGSIL Pap smear of vagina 12/15/2016  . Anal skin tag 02/02/2016  . Inflammatory arthritis 01/19/2016  . Right knee pain 01/19/2016  . Genital herpes 10/20/2015  . GERD (gastroesophageal reflux disease) 10/20/2015  . Status post vaginal hysterectomy 10/20/2015  . Health care maintenance 05/30/2015  . LLQ pain 03/31/2014  . Menopausal symptoms 03/31/2014  . Generalized abdominal pain 10/05/2013  . Female stress incontinence 08/18/2013  . Incomplete emptying of bladder 08/18/2013  . Bladder pain 08/18/2013  . Hypercholesterolemia 12/31/2012  . Hypothyroidism, unspecified 12/29/2012  . Osteoarthritis 12/29/2012  . Benign neoplasm of mouth 09/14/2012  . PULMONARY NODULE 01/02/2008  . Mild depression ([place]) 12/30/2007  . Essential hypertension 12/30/2007  . Depression 12/30/2007    Greely Atiyeh PT, DPT 09/11/2019, 1:11 PM  [place] MAIN [facility] SERVICES [address] [place], [place], [address] Phone: [phone]   Fax:  [phone]  Name:  Donna Rhodes MRN: [id number] Date of Birth: 1946-08-17

## 2019-09-13 ENCOUNTER — Ambulatory Visit: Payer: PPO | Admitting: Physical Therapy

## 2019-09-18 ENCOUNTER — Other Ambulatory Visit: Payer: Self-pay | Admitting: Internal Medicine

## 2019-09-18 ENCOUNTER — Ambulatory Visit: Payer: PPO | Admitting: Physical Therapy

## 2019-09-20 ENCOUNTER — Ambulatory Visit: Payer: PPO | Admitting: Physical Therapy

## 2019-09-25 ENCOUNTER — Ambulatory Visit: Payer: PPO | Admitting: Physical Therapy

## 2019-09-27 ENCOUNTER — Ambulatory Visit: Payer: PPO | Admitting: Physical Therapy

## 2019-10-02 ENCOUNTER — Ambulatory Visit: Payer: PPO | Admitting: Physical Therapy

## 2019-10-02 ENCOUNTER — Telehealth: Payer: Self-pay | Admitting: Internal Medicine

## 2019-10-02 NOTE — Telephone Encounter (Signed)
Advised that eye center will monitor when to start/stop medications.

## 2019-10-02 NOTE — Telephone Encounter (Signed)
Patient states she is scheduled for eye surgery on 10/16. Patient was advised by [facility] to stop Plavix 3-4 days prior to surgery. Patient is requesting a call back to discuss further. Please advise.

## 2019-10-04 ENCOUNTER — Ambulatory Visit: Payer: PPO | Admitting: Physical Therapy

## 2019-10-09 ENCOUNTER — Other Ambulatory Visit: Payer: PPO

## 2019-10-10 ENCOUNTER — Other Ambulatory Visit (INDEPENDENT_AMBULATORY_CARE_PROVIDER_SITE_OTHER): Payer: PPO

## 2019-10-10 ENCOUNTER — Other Ambulatory Visit: Payer: Self-pay

## 2019-10-10 DIAGNOSIS — E039 Hypothyroidism, unspecified: Secondary | ICD-10-CM

## 2019-10-11 DIAGNOSIS — L219 Seborrheic dermatitis, unspecified: Secondary | ICD-10-CM | POA: Diagnosis not present

## 2019-10-11 DIAGNOSIS — L03012 Cellulitis of left finger: Secondary | ICD-10-CM | POA: Diagnosis not present

## 2019-10-11 DIAGNOSIS — C441122 Basal cell carcinoma of skin of right lower eyelid, including canthus: Secondary | ICD-10-CM | POA: Diagnosis not present

## 2019-10-11 DIAGNOSIS — Z20828 Contact with and (suspected) exposure to other viral communicable diseases: Secondary | ICD-10-CM | POA: Diagnosis not present

## 2019-10-11 LAB — TSH: TSH: 0.81 u[IU]/mL (ref 0.35–4.50)

## 2019-10-12 DIAGNOSIS — C441122 Basal cell carcinoma of skin of right lower eyelid, including canthus: Secondary | ICD-10-CM | POA: Diagnosis not present

## 2019-10-13 DIAGNOSIS — H018 Other specified inflammations of eyelid: Secondary | ICD-10-CM | POA: Diagnosis not present

## 2019-10-13 DIAGNOSIS — Z8249 Family history of ischemic heart disease and other diseases of the circulatory system: Secondary | ICD-10-CM | POA: Diagnosis not present

## 2019-10-13 DIAGNOSIS — Z8673 Personal history of transient ischemic attack (TIA), and cerebral infarction without residual deficits: Secondary | ICD-10-CM | POA: Diagnosis not present

## 2019-10-13 DIAGNOSIS — E7800 Pure hypercholesterolemia, unspecified: Secondary | ICD-10-CM | POA: Diagnosis not present

## 2019-10-13 DIAGNOSIS — C441122 Basal cell carcinoma of skin of right lower eyelid, including canthus: Secondary | ICD-10-CM | POA: Diagnosis not present

## 2019-10-13 DIAGNOSIS — Z79899 Other long term (current) drug therapy: Secondary | ICD-10-CM | POA: Diagnosis not present

## 2019-10-13 DIAGNOSIS — Z87891 Personal history of nicotine dependence: Secondary | ICD-10-CM | POA: Diagnosis not present

## 2019-10-13 DIAGNOSIS — M069 Rheumatoid arthritis, unspecified: Secondary | ICD-10-CM | POA: Diagnosis not present

## 2019-10-13 DIAGNOSIS — Z483 Aftercare following surgery for neoplasm: Secondary | ICD-10-CM | POA: Diagnosis not present

## 2019-10-13 DIAGNOSIS — H1089 Other conjunctivitis: Secondary | ICD-10-CM | POA: Diagnosis not present

## 2019-10-13 DIAGNOSIS — I10 Essential (primary) hypertension: Secondary | ICD-10-CM | POA: Diagnosis not present

## 2019-10-13 DIAGNOSIS — K219 Gastro-esophageal reflux disease without esophagitis: Secondary | ICD-10-CM | POA: Diagnosis not present

## 2019-10-13 DIAGNOSIS — M1990 Unspecified osteoarthritis, unspecified site: Secondary | ICD-10-CM | POA: Diagnosis not present

## 2019-11-16 DIAGNOSIS — Z79899 Other long term (current) drug therapy: Secondary | ICD-10-CM | POA: Diagnosis not present

## 2019-11-16 DIAGNOSIS — M8949 Other hypertrophic osteoarthropathy, multiple sites: Secondary | ICD-10-CM | POA: Diagnosis not present

## 2019-11-16 DIAGNOSIS — M0609 Rheumatoid arthritis without rheumatoid factor, multiple sites: Secondary | ICD-10-CM | POA: Diagnosis not present

## 2019-11-16 DIAGNOSIS — I6389 Other cerebral infarction: Secondary | ICD-10-CM | POA: Diagnosis not present

## 2019-11-20 ENCOUNTER — Ambulatory Visit (INDEPENDENT_AMBULATORY_CARE_PROVIDER_SITE_OTHER): Payer: PPO | Admitting: Internal Medicine

## 2019-11-20 ENCOUNTER — Telehealth: Payer: Self-pay

## 2019-11-20 ENCOUNTER — Telehealth: Payer: Self-pay | Admitting: Internal Medicine

## 2019-11-20 ENCOUNTER — Other Ambulatory Visit: Payer: Self-pay

## 2019-11-20 DIAGNOSIS — I10 Essential (primary) hypertension: Secondary | ICD-10-CM | POA: Diagnosis not present

## 2019-11-20 DIAGNOSIS — F4323 Adjustment disorder with mixed anxiety and depressed mood: Secondary | ICD-10-CM

## 2019-11-20 DIAGNOSIS — E7800 Pure hypercholesterolemia, unspecified: Secondary | ICD-10-CM | POA: Diagnosis not present

## 2019-11-20 DIAGNOSIS — F320 Major depressive disorder, single episode, mild: Secondary | ICD-10-CM | POA: Diagnosis not present

## 2019-11-20 DIAGNOSIS — M0600 Rheumatoid arthritis without rheumatoid factor, unspecified site: Secondary | ICD-10-CM

## 2019-11-20 DIAGNOSIS — G8320 Monoplegia of upper limb affecting unspecified side: Secondary | ICD-10-CM | POA: Diagnosis not present

## 2019-11-20 DIAGNOSIS — K219 Gastro-esophageal reflux disease without esophagitis: Secondary | ICD-10-CM | POA: Diagnosis not present

## 2019-11-20 DIAGNOSIS — E039 Hypothyroidism, unspecified: Secondary | ICD-10-CM | POA: Diagnosis not present

## 2019-11-20 DIAGNOSIS — Z8673 Personal history of transient ischemic attack (TIA), and cerebral infarction without residual deficits: Secondary | ICD-10-CM | POA: Diagnosis not present

## 2019-11-20 DIAGNOSIS — F32A Depression, unspecified: Secondary | ICD-10-CM

## 2019-11-20 NOTE — Telephone Encounter (Signed)
Copied from [place] [id number]. Topic: General - Other >> Nov 20, 2019  5:00 PM Wynetta Emery, [place] C wrote: Reason for CRM: pt says that she is returning the office  call to set up labs, not showing orders in system. Please return pt's call to assist.

## 2019-11-20 NOTE — Telephone Encounter (Signed)
Lm for pt to call back and schedule appt  Return in about 2 months (around 01/20/2020) for follow up appt (30min).  Schedule fasting labs within 1-2 weeks.

## 2019-11-20 NOTE — Progress Notes (Signed)
Virtual Visit via telephone Note  This visit type was conducted due to national recommendations for restrictions regarding the COVID-19 pandemic (e.g. social distancing).  This format is felt to be most appropriate for this patient at this time.  All issues noted in this document were discussed and addressed.  No physical exam was performed (except for noted visual exam findings with Video Visits).   I connected with Donna Rhodes by telephone and verified that I am speaking with the correct person using two identifiers. Location patient: home Location provider: work  Persons participating in the telephone visit: patient, provider  I discussed the limitations, risks, security and privacy concerns of performing an evaluation and management service by telephone and the availability of in person appointments. The patient expressed understanding and agreed to proceed.   Reason for visit: scheduled follow up  HPI: Just recently had eye surgery.  Doing well.  Sees Dr Jefm Bryant.  On MTX injections.  Last evaluated 07/17/19.  Blood pressure doing well.  Is trying to stay active.  She is getting around better.  Going out in her yard working.  Still with increased stress related to her medical issues.  Also some stress with family issues, but she feels she is handling things relatively well.  Does not feel she needs any further intervention.  On citalopram.  Reports has noticed being a little winded with some increased activity.  No chest pain.  Discussed further w/up and evaluation.  She declines.  Wants to monitor.  No acid reflux.  No abdominal pain.  Bowels stable.     ROS: See pertinent positives and negatives per HPI.  Past Medical History:  Diagnosis Date  . Atrophic vaginitis   . Dysplasia of vagina    vin 1 - vulva and vaginal cuff  . GERD (gastroesophageal reflux disease)   . HCV infection   . HSV (herpes simplex virus) infection   . Hypercholesterolemia   . Hypertension   .  Hypothyroidism   . Left-sided weakness    S/P CVA 4/18.  PT 2x/wk.  . Lung nodule    stable  . Menopausal state   . Osteoarthritis    hand involvement, cervical disc disease  . Osteoporosis   . Pelvic pain in female   . Rheumatoid arthritis ([place])   . Stroke (cerebrum) ([place]) 04/24/2017  . Thyroid nodule    pt unaware of this   . Vaginal Pap smear, abnormal    lgsil    Past Surgical History:  Procedure Laterality Date  . ADENOIDECTOMY    . APPENDECTOMY  1965  . [place]   left  . CATARACT EXTRACTION W/PHACO Right 10/27/2017   Procedure: CATARACT EXTRACTION PHACO AND INTRAOCULAR LENS PLACEMENT ([place]) RIGHT;  Surgeon: Leandrew Koyanagi, MD;  Location: [facility];  Service: Ophthalmology;  Laterality: Right;  requests early (8-8:30 arrival)  . CATARACT EXTRACTION W/PHACO Left 11/10/2017   Procedure: CATARACT EXTRACTION PHACO AND INTRAOCULAR LENS PLACEMENT ([place]) LEFT;  Surgeon: Leandrew Koyanagi, MD;  Location: [facility];  Service: Ophthalmology;  Laterality: Left;  . CHOLECYSTECTOMY    . [place]  . laser vein surgery    . LOOP RECORDER INSERTION N/A 09/24/2017   Procedure: LOOP RECORDER INSERTION;  Surgeon: Evans Lance, MD;  Location: [facility];  Service: Cardiovascular;  Laterality: N/A;  . MANDIBLE RECONSTRUCTION  1992  . TOENAIL EXCISION  1998   several  . TONSILLECTOMY    . TUBAL LIGATION  [place]   als0- anterior/posterior colporrhaphy  . VAGINAL WOUND CLOSURE / REPAIR  1999, 2000  . VAGINAL WOUND CLOSURE / REPAIR      Family History  Problem Relation Age of Onset  . Hypertension Father   . Dementia Father   . Osteoarthritis Mother   . Arthritis/Rheumatoid Sister   . Breast cancer Other        paternal side  . Breast cancer Paternal Aunt   . Breast cancer Cousin        maternal  . Uterine cancer Maternal Grandmother   . Ovarian cancer Neg Hx   . Heart disease Neg Hx   .  Diabetes Neg Hx   . Colon cancer Neg Hx     SOCIAL HX: reviewed.    Current Outpatient Medications:  .  acetaminophen (TYLENOL) 500 MG tablet, Take 500-1,000 mg by mouth every 6 (six) hours as needed for mild pain., Disp: , Rfl:  .  acyclovir (ZOVIRAX) 200 MG capsule, Take 1 capsule (200 mg total) by mouth 2 (two) times daily., Disp: 180 capsule, Rfl: 3 .  amLODipine (NORVASC) 2.5 MG tablet, Take 1 tablet (2.5 mg total) by mouth daily., Disp: 90 tablet, Rfl: 1 .  atorvastatin (LIPITOR) 20 MG tablet, Take 1 tablet (20 mg total) by mouth daily., Disp: 90 tablet, Rfl: 1 .  calcium-vitamin D (OSCAL WITH D) 250-125 MG-UNIT tablet, Take 1 tablet by mouth daily., Disp: , Rfl:  .  citalopram (CELEXA) 20 MG tablet, Take 1 tablet (20 mg total) by mouth daily., Disp: 30 tablet, Rfl: 2 .  clopidogrel (PLAVIX) 75 MG tablet, Take 1 tablet by mouth once daily, Disp: 90 tablet, Rfl: 0 .  folic acid (FOLVITE) 1 MG tablet, Take 1 mg by mouth daily., Disp: , Rfl:  .  levothyroxine (SYNTHROID) 50 MCG tablet, Take 1 tablet (50 mcg total) by mouth daily., Disp: 90 tablet, Rfl: 1 .  methotrexate 50 MG/2ML injection, Inject 0.21mL under the skin once a weekly on Wednesday, Disp: , Rfl:  .  Multiple Vitamin (MULTIVITAMIN WITH MINERALS) TABS tablet, Take 1 tablet by mouth daily., Disp: , Rfl:  .  pantoprazole (PROTONIX) 20 MG tablet, Take 1 tablet (20 mg total) by mouth daily., Disp: 90 tablet, Rfl: 1  EXAM:  VITALS per patient if applicable: weight [id number], 124/62  GENERAL: alert.  Sounds to be in no acute distress.  Answering questions appropriately.    PSYCH/NEURO: pleasant and cooperative, no obvious depression or anxiety, speech and thought processing grossly intact  ASSESSMENT AND PLAN:  Discussed the following assessment and plan:  Adjustment disorder with mixed anxiety and depressed mood On citalopram.  Overall she feels she is doing relatively well.  Does not feel needs any further intervention.     Benign essential HTN Per her report, blood pressure doing well.  Continue same medication regimen. Follow pressures.  Follow metabolic panel.   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  Doing better.  Continue exercises.    GERD (gastroesophageal reflux disease) Controlled.   History of CVA (cerebrovascular accident) Stable.  Getting around better.  Follow.   Hypercholesterolemia On lipitor.  Low cholesterol diet and exercise.  Follow lipid panel and liver function tests.    Hypothyroidism, unspecified On thyroid replacement.  Follow tsh.   Mild depression ([facility]) On citalopram.  Follow.   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) Followed by Dr Jefm Bryant. On MTX.     I  discussed the assessment and treatment plan with the patient. The patient was provided an opportunity to ask questions and all were answered. The patient agreed with the plan and demonstrated an understanding of the instructions.   The patient was advised to call back or seek an in-person evaluation if the symptoms worsen or if the condition fails to improve as anticipated.  I provided 21 minutes of non-face-to-face time during this encounter.   Einar Pheasant, MD

## 2019-11-21 ENCOUNTER — Telehealth: Payer: Self-pay | Admitting: Internal Medicine

## 2019-11-21 NOTE — Telephone Encounter (Signed)
Lab orders placed. Please schedule fasting lab appt

## 2019-11-21 NOTE — Telephone Encounter (Signed)
Lm to call back and schedule appt.. please see phone message from yesterday

## 2019-11-21 NOTE — Telephone Encounter (Signed)
Scheduled lab appt for 11/25 in error. Per phone note scheduled follow up appt 1/25 and lab within one week on 02/01. Unable to reach patient, LVM

## 2019-11-22 ENCOUNTER — Other Ambulatory Visit: Payer: PPO

## 2019-11-23 ENCOUNTER — Encounter: Payer: Self-pay | Admitting: Internal Medicine

## 2019-11-23 NOTE — Assessment & Plan Note (Signed)
Controlled.  

## 2019-11-23 NOTE — Assessment & Plan Note (Signed)
On citalopram.  Follow.

## 2019-11-23 NOTE — Assessment & Plan Note (Signed)
On citalopram.  Overall she feels she is doing relatively well.  Does not feel needs any further intervention.

## 2019-11-23 NOTE — Assessment & Plan Note (Signed)
Per her report, blood pressure doing well.  Continue same medication regimen. Follow pressures.  Follow metabolic panel.

## 2019-11-23 NOTE — Assessment & Plan Note (Signed)
Followed by neurology.  Doing better.  Continue exercises.

## 2019-11-23 NOTE — Assessment & Plan Note (Signed)
Followed by Dr Kernodle.  On MTX.   

## 2019-11-23 NOTE — Assessment & Plan Note (Signed)
On thyroid replacement.  Follow tsh.  

## 2019-11-23 NOTE — Assessment & Plan Note (Signed)
On lipitor.  Low cholesterol diet and exercise.  Follow lipid panel and liver function tests.   

## 2019-11-23 NOTE — Assessment & Plan Note (Signed)
Stable.  Getting around better.  Follow.

## 2019-11-25 IMAGING — MG DIGITAL SCREENING BILATERAL MAMMOGRAM WITH TOMO AND CAD
8 series · 9 of 24 positions shown · non-contrast
Comparison: Previous exam(s).

CLINICAL DATA: Screening.

EXAM:
DIGITAL SCREENING BILATERAL MAMMOGRAM WITH TOMO AND CAD

[L CC synth-2D]
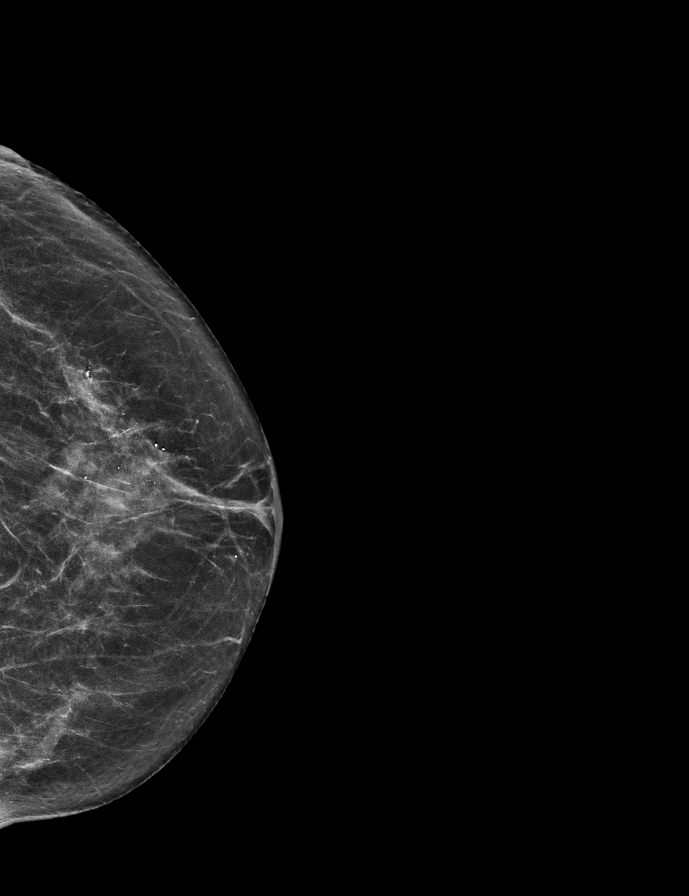

[R MLO synth-2D]
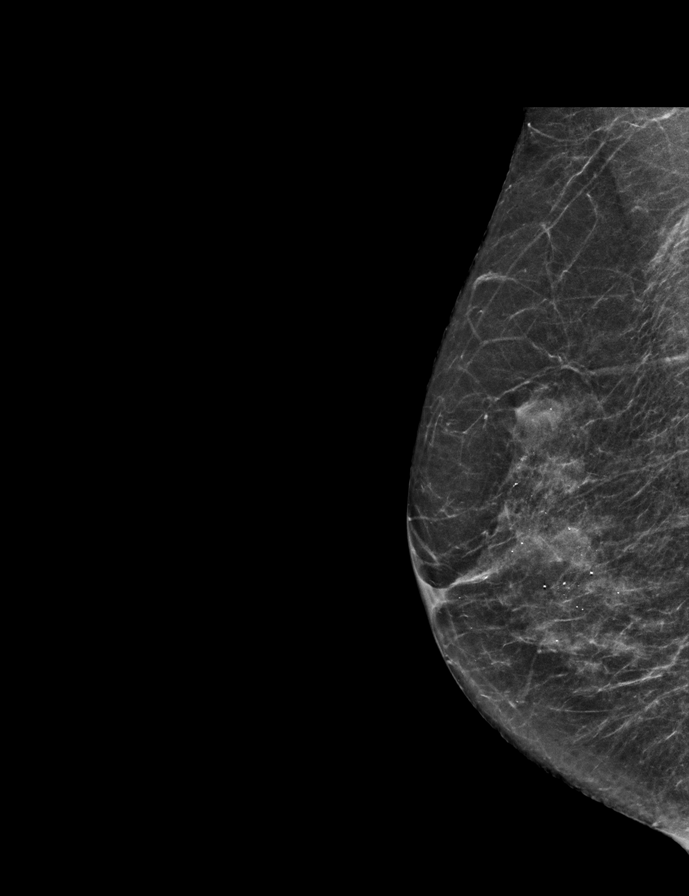

[L MLO synth-2D]
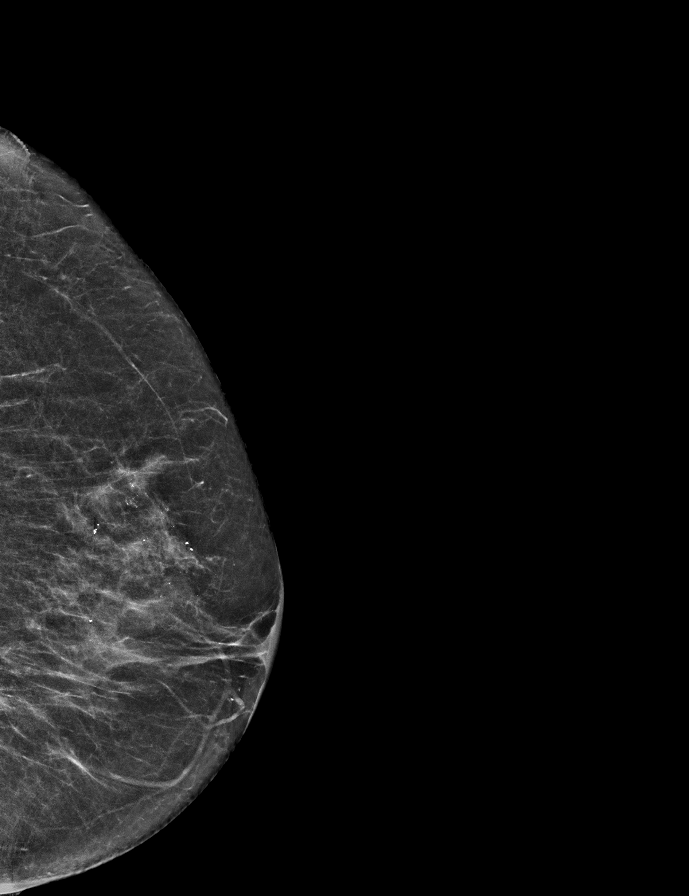

[R CC synth-2D]
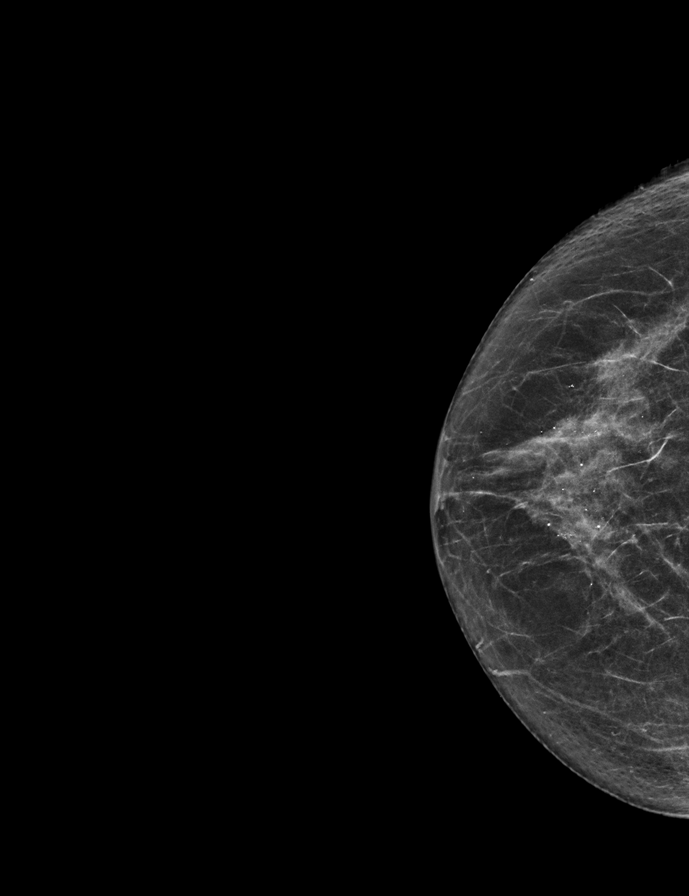

[R CC tomo · 2 of 55 frames shown]
[frame 18/55]
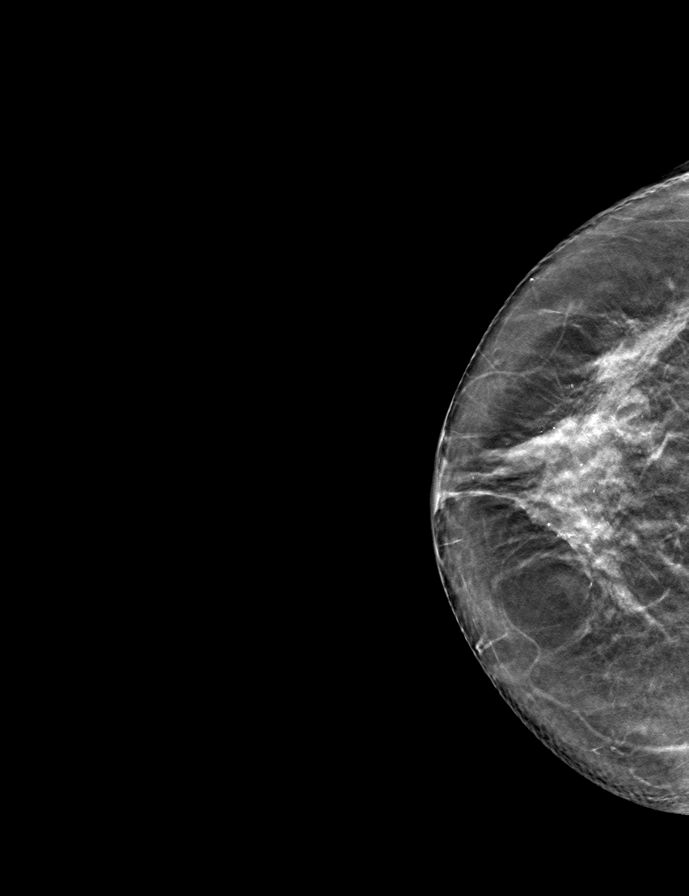
[frame 28/55]
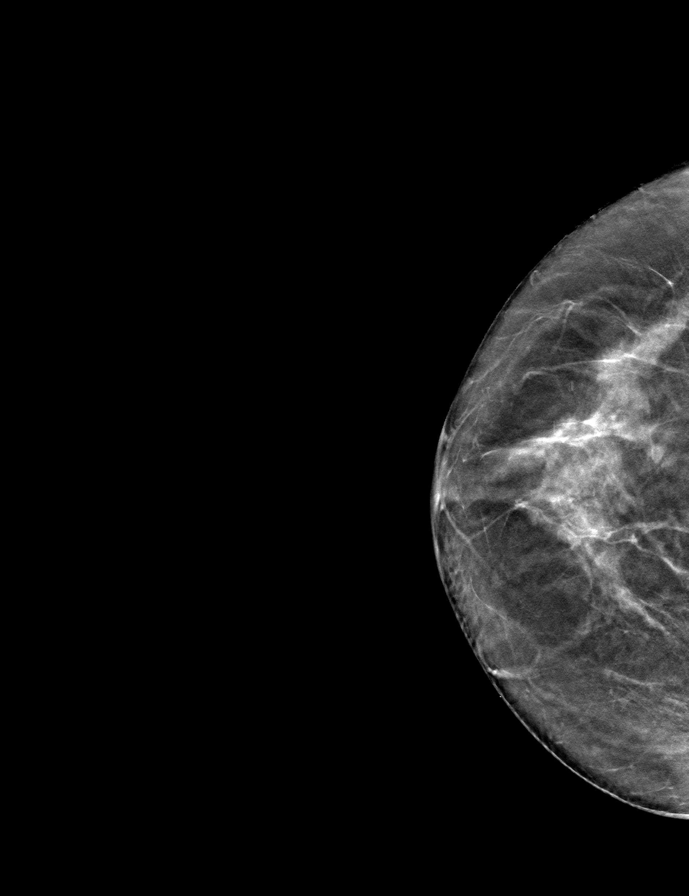

[L MLO tomo · tomo slice 27/52.0]
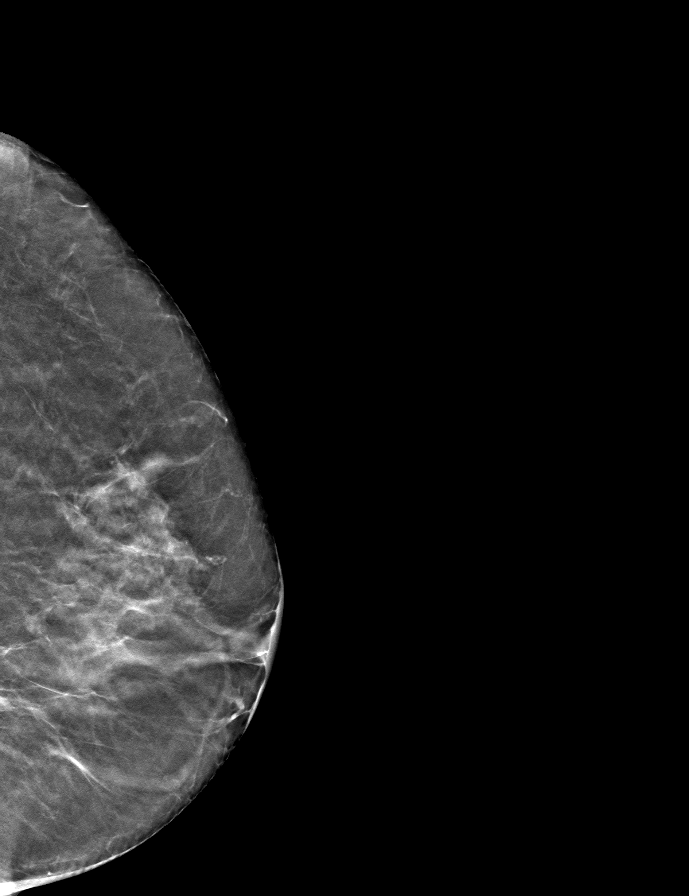

[L CC tomo · tomo slice 28/55.0]
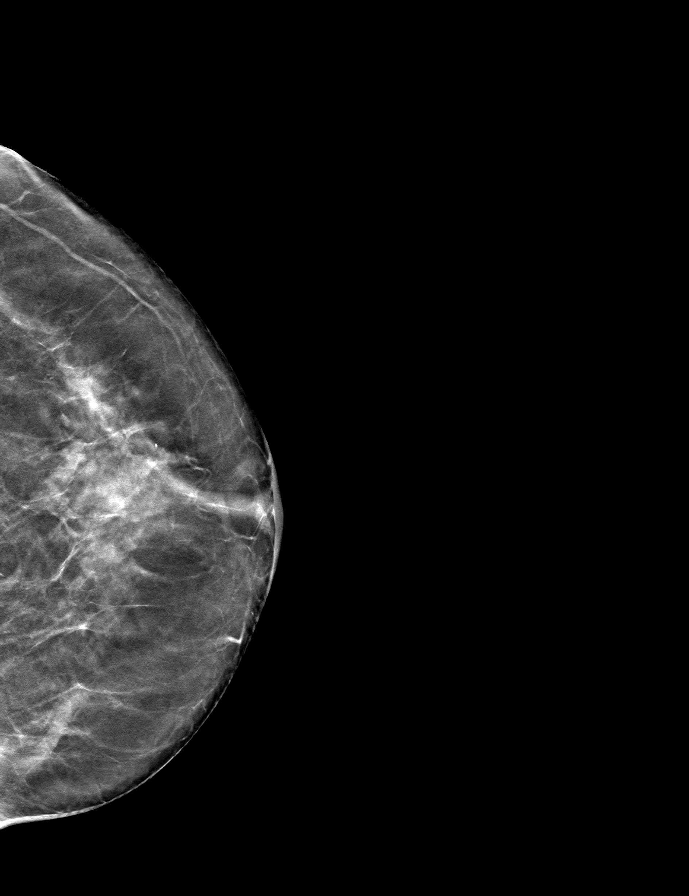

[R MLO tomo · tomo slice 25/50.0]
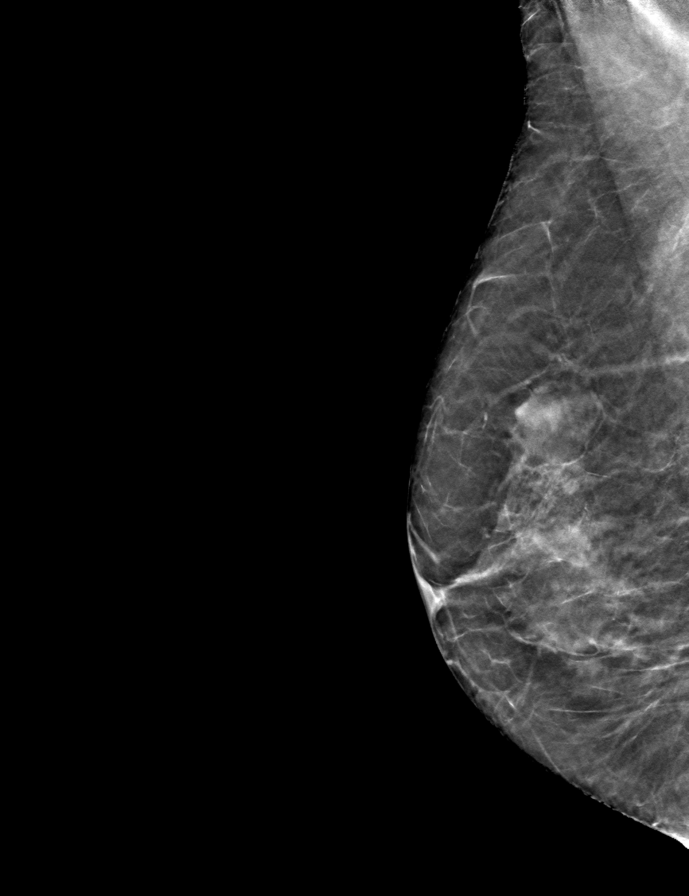

[9 of 24 positions shown; findings below may reference images not displayed]

ACR Breast Density Category b: There are scattered areas of
fibroglandular density.
FINDINGS: There are no findings suspicious for malignancy. Images were
processed with CAD.
IMPRESSION: No mammographic evidence of malignancy. A result letter of this
screening mammogram will be mailed directly to the patient.

RECOMMENDATION:
Screening mammogram in one year. (Code:[id number])

BI-RADS CATEGORY  1: Negative.

## 2019-11-27 ENCOUNTER — Telehealth: Payer: Self-pay | Admitting: Internal Medicine

## 2019-11-27 NOTE — Telephone Encounter (Signed)
When will patient need labs ?

## 2019-11-27 NOTE — Telephone Encounter (Signed)
Called and scheduled pt for 12/07/19

## 2019-11-27 NOTE — Telephone Encounter (Signed)
Yes.  Ok to schedule labs in the next 1-2 weeks.

## 2019-11-27 NOTE — Telephone Encounter (Signed)
Patient is calling to ask Donna Rhodes is it time to have labs. Orders where placed. However, the patient declined to schedule. She wanted to know was it time?  Please advise CB- [phone]

## 2019-12-05 ENCOUNTER — Other Ambulatory Visit: Payer: Self-pay

## 2019-12-07 ENCOUNTER — Ambulatory Visit (INDEPENDENT_AMBULATORY_CARE_PROVIDER_SITE_OTHER): Payer: PPO | Admitting: *Deleted

## 2019-12-07 ENCOUNTER — Other Ambulatory Visit (INDEPENDENT_AMBULATORY_CARE_PROVIDER_SITE_OTHER): Payer: PPO

## 2019-12-07 ENCOUNTER — Other Ambulatory Visit: Payer: Self-pay

## 2019-12-07 ENCOUNTER — Telehealth: Payer: Self-pay

## 2019-12-07 DIAGNOSIS — M069 Rheumatoid arthritis, unspecified: Secondary | ICD-10-CM

## 2019-12-07 DIAGNOSIS — Z23 Encounter for immunization: Secondary | ICD-10-CM

## 2019-12-07 DIAGNOSIS — E7800 Pure hypercholesterolemia, unspecified: Secondary | ICD-10-CM | POA: Diagnosis not present

## 2019-12-07 DIAGNOSIS — I10 Essential (primary) hypertension: Secondary | ICD-10-CM

## 2019-12-07 LAB — CBC WITH DIFFERENTIAL/PLATELET
Basophils Absolute: 0.1 10*3/uL (ref 0.0–0.1)
Basophils Relative: 2.1 % (ref 0.0–3.0)
Eosinophils Absolute: 0.2 10*3/uL (ref 0.0–0.7)
Eosinophils Relative: 4.1 % (ref 0.0–5.0)
HCT: 39.2 % (ref 36.0–46.0)
Hemoglobin: 12.7 g/dL (ref 12.0–15.0)
Lymphocytes Relative: 33.4 % (ref 12.0–46.0)
Lymphs Abs: 1.5 10*3/uL (ref 0.7–4.0)
MCHC: 32.3 g/dL (ref 30.0–36.0)
MCV: 93.7 fl (ref 78.0–100.0)
Monocytes Absolute: 0.4 10*3/uL (ref 0.1–1.0)
Monocytes Relative: 9.7 % (ref 3.0–12.0)
Neutro Abs: 2.3 10*3/uL (ref 1.4–7.7)
Neutrophils Relative %: 50.7 % (ref 43.0–77.0)
Platelets: 254 10*3/uL (ref 150.0–400.0)
RBC: 4.19 Mil/uL (ref 3.87–5.11)
RDW: 15.1 % (ref 11.5–15.5)
WBC: 4.6 10*3/uL (ref 4.0–10.5)

## 2019-12-07 LAB — SEDIMENTATION RATE: Sed Rate: 8 mm/hr (ref 0–30)

## 2019-12-07 LAB — TSH: TSH: 2.38 u[IU]/mL (ref 0.35–4.50)

## 2019-12-07 LAB — BASIC METABOLIC PANEL
BUN: 15 mg/dL (ref 6–23)
CO2: 27 mEq/L (ref 19–32)
Calcium: 9.4 mg/dL (ref 8.4–10.5)
Chloride: 106 mEq/L (ref 96–112)
Creatinine, Ser: 0.72 mg/dL (ref 0.40–1.20)
GFR: 79.17 mL/min (ref >60.00)
Glucose, Bld: 87 mg/dL (ref 70–99)
Potassium: 4.3 mEq/L (ref 3.5–5.1)
Sodium: 140 mEq/L (ref 135–145)

## 2019-12-07 LAB — LIPID PANEL
Cholesterol: 168 mg/dL (ref 0–200)
HDL: 69.9 mg/dL (ref >39.00)
LDL Cholesterol: 87 mg/dL (ref 0–99)
NonHDL: 98.12
Total CHOL/HDL Ratio: 2
Triglycerides: 54 mg/dL (ref 0.0–149.0)
VLDL: 10.8 mg/dL (ref 0.0–40.0)

## 2019-12-07 LAB — HEPATIC FUNCTION PANEL
ALT: 19 U/L (ref 0–35)
AST: 22 U/L (ref 0–37)
Albumin: 4.3 g/dL (ref 3.5–5.2)
Alkaline Phosphatase: 89 U/L (ref 39–117)
Bilirubin, Direct: 0.2 mg/dL (ref 0.0–0.3)
Total Bilirubin: 0.7 mg/dL (ref 0.2–1.2)
Total Protein: 7 g/dL (ref 6.0–8.3)

## 2019-12-07 LAB — ALBUMIN: Albumin: 4.3 g/dL (ref 3.5–5.2)

## 2019-12-07 LAB — CREATININE, SERUM: Creatinine, Ser: 0.71 mg/dL (ref 0.40–1.20)

## 2019-12-07 NOTE — Telephone Encounter (Signed)
Pt brought in rx from Dr Jefm Bryant to have the following labs drawn with her labs here. I have ordered future and will send to Rheumatology once results are received. Just wanted you to be aware why they were added on to her routine labs.

## 2019-12-07 NOTE — Addendum Note (Signed)
Addended by: Elpidio Galea T on: 12/07/2019 09:38 AM   Modules accepted: Orders

## 2019-12-07 NOTE — Telephone Encounter (Signed)
Were these the only three labs he wanted.  The creatinine is in the met b and and the albumin is in the liver panel.  Esr ordered.  I will delete the duplicates.  I just wanted to make sure this was all he needed.  Thank you for ordering.

## 2019-12-07 NOTE — Telephone Encounter (Signed)
Yes, this is all he wanted. She brought in a paper that he filled out.

## 2019-12-08 ENCOUNTER — Telehealth: Payer: Self-pay | Admitting: Internal Medicine

## 2019-12-08 DIAGNOSIS — Z23 Encounter for immunization: Secondary | ICD-10-CM | POA: Diagnosis not present

## 2019-12-08 NOTE — Telephone Encounter (Signed)
Medication: levothyroxine (SYNTHROID) 50 MCG tablet [id number] -Patient states that the pharmacy has changed companys that the drug comes from needs Dr. Lars Mage approval  Has the patient contacted their pharmacy? Yes  (Agent: If no, request that the patient contact the pharmacy for the refill.) (Agent: If yes, when and what did the pharmacy advise?)  Preferred Pharmacy (with phone number or street name): [place], [place]  Phone:  [phone] Fax:  [phone]     Agent: Please be advised that RX refills may take up to 3 business days. We ask that you follow-up with your pharmacy.

## 2019-12-11 MED ORDER — LEVOTHYROXINE SODIUM 50 MCG PO TABS: 50.0000 ug | ORAL_TABLET | Freq: Every day | ORAL | 1 refills | Status: DC

## 2019-12-11 NOTE — Telephone Encounter (Signed)
Pt calling to f/up on this request. Pt states she has been out of medication since Friday 12/11 and would like a refill asap.

## 2019-12-25 ENCOUNTER — Other Ambulatory Visit: Payer: Self-pay | Admitting: Internal Medicine

## 2019-12-25 NOTE — Telephone Encounter (Signed)
Pt needs refills on Plavix and amLODipine (NORVASC) 2.5 MG tablet

## 2020-01-03 DIAGNOSIS — L821 Other seborrheic keratosis: Secondary | ICD-10-CM | POA: Diagnosis not present

## 2020-01-03 DIAGNOSIS — L853 Xerosis cutis: Secondary | ICD-10-CM | POA: Diagnosis not present

## 2020-01-03 DIAGNOSIS — L03012 Cellulitis of left finger: Secondary | ICD-10-CM | POA: Diagnosis not present

## 2020-01-03 DIAGNOSIS — Z85828 Personal history of other malignant neoplasm of skin: Secondary | ICD-10-CM | POA: Diagnosis not present

## 2020-01-03 DIAGNOSIS — L603 Nail dystrophy: Secondary | ICD-10-CM | POA: Diagnosis not present

## 2020-01-03 DIAGNOSIS — L608 Other nail disorders: Secondary | ICD-10-CM | POA: Diagnosis not present

## 2020-01-03 DIAGNOSIS — L820 Inflamed seborrheic keratosis: Secondary | ICD-10-CM | POA: Diagnosis not present

## 2020-01-22 ENCOUNTER — Telehealth: Payer: Self-pay | Admitting: Internal Medicine

## 2020-01-22 ENCOUNTER — Ambulatory Visit (INDEPENDENT_AMBULATORY_CARE_PROVIDER_SITE_OTHER): Payer: PPO | Admitting: Internal Medicine

## 2020-01-22 ENCOUNTER — Other Ambulatory Visit: Payer: Self-pay

## 2020-01-22 DIAGNOSIS — F32A Depression, unspecified: Secondary | ICD-10-CM

## 2020-01-22 DIAGNOSIS — G8320 Monoplegia of upper limb affecting unspecified side: Secondary | ICD-10-CM | POA: Diagnosis not present

## 2020-01-22 DIAGNOSIS — I10 Essential (primary) hypertension: Secondary | ICD-10-CM

## 2020-01-22 DIAGNOSIS — K219 Gastro-esophageal reflux disease without esophagitis: Secondary | ICD-10-CM

## 2020-01-22 DIAGNOSIS — I639 Cerebral infarction, unspecified: Secondary | ICD-10-CM

## 2020-01-22 DIAGNOSIS — E039 Hypothyroidism, unspecified: Secondary | ICD-10-CM | POA: Diagnosis not present

## 2020-01-22 DIAGNOSIS — M0600 Rheumatoid arthritis without rheumatoid factor, unspecified site: Secondary | ICD-10-CM

## 2020-01-22 DIAGNOSIS — Z8673 Personal history of transient ischemic attack (TIA), and cerebral infarction without residual deficits: Secondary | ICD-10-CM

## 2020-01-22 DIAGNOSIS — F320 Major depressive disorder, single episode, mild: Secondary | ICD-10-CM

## 2020-01-22 DIAGNOSIS — G8114 Spastic hemiplegia affecting left nondominant side: Secondary | ICD-10-CM | POA: Diagnosis not present

## 2020-01-22 DIAGNOSIS — R42 Dizziness and giddiness: Secondary | ICD-10-CM | POA: Diagnosis not present

## 2020-01-22 DIAGNOSIS — E7800 Pure hypercholesterolemia, unspecified: Secondary | ICD-10-CM | POA: Diagnosis not present

## 2020-01-22 NOTE — Progress Notes (Signed)
Patient ID: Donna Rhodes, female   DOB: 1946/01/31, 74 y.o.   MRN: [id number]   Virtual Visit via telephone Note  This visit type was conducted due to national recommendations for restrictions regarding the COVID-19 pandemic (e.g. social distancing).  This format is felt to be most appropriate for this patient at this time.  All issues noted in this document were discussed and addressed.  No physical exam was performed (except for noted visual exam findings with Video Visits).   I connected with [name] by telephone and verified that I am speaking with the correct person using two identifiers. Location patient: home Location provider: work Persons participating in the telephone visit: patient, provider  I discussed the limitations, risks, security and privacy concerns of performing an evaluation and management service by telephone and the availability of in person appointments. The patient expressed understanding and agreed to proceed.   Reason for visit: scheduled follow up.   HPI: She reports she is doing relatively well.  Seeing Dr Jefm Bryant for RA.  Receiving MTX injections.  Joints stable.  Increased stress - family stress. Mother living with her.  Taking citalopram.  Overall she feels she is handling things relatively well.  Tries to stay as active as possible.  Still with some limited activity - residual from her stroke, but overall doing better.  No chest pain.  Breathing stable.  No sob.  Did fall three weeks ago.  Slipped on ice.  Fell on her back.  No residual pain from the fall.  Did not hit her head.  She does report a spot in her ear - canal.  Persistent.  Sore to touch.  Present for 3-4 weeks.  Also reports noticing a balance issue.  Some increased ear pressure at times.  Notices some balance issues, but describes as feeling like she is getting off a roller coaster.  No headache.  Request referral to ENT.  Eating.  No nausea or vomiting.  Bowels moving.     ROS: See  pertinent positives and negatives per HPI.  Past Medical History:  Diagnosis Date  . Atrophic vaginitis   . Dysplasia of vagina    vin 1 - vulva and vaginal cuff  . GERD (gastroesophageal reflux disease)   . HCV infection   . HSV (herpes simplex virus) infection   . Hypercholesterolemia   . Hypertension   . Hypothyroidism   . Left-sided weakness    S/P CVA 4/18.  PT 2x/wk.  . Lung nodule    stable  . Menopausal state   . Osteoarthritis    hand involvement, cervical disc disease  . Osteoporosis   . Pelvic pain in female   . Rheumatoid arthritis ([place])   . Stroke (cerebrum) ([place]) 04/24/2017  . Thyroid nodule    pt unaware of this   . Vaginal Pap smear, abnormal    lgsil    Past Surgical History:  Procedure Laterality Date  . ADENOIDECTOMY    . APPENDECTOMY  1965  . [place]   left  . CATARACT EXTRACTION W/PHACO Right 10/27/2017   Procedure: CATARACT EXTRACTION PHACO AND INTRAOCULAR LENS PLACEMENT ([place]) RIGHT;  Surgeon: Leandrew Koyanagi, MD;  Location: [facility];  Service: Ophthalmology;  Laterality: Right;  requests early (8-8:30 arrival)  . CATARACT EXTRACTION W/PHACO Left 11/10/2017   Procedure: CATARACT EXTRACTION PHACO AND INTRAOCULAR LENS PLACEMENT ([place]) LEFT;  Surgeon: Leandrew Koyanagi, MD;  Location: [facility];  Service: Ophthalmology;  Laterality: Left;  . CHOLECYSTECTOMY    .  [place]  . laser vein surgery    . LOOP RECORDER INSERTION N/A 09/24/2017   Procedure: LOOP RECORDER INSERTION;  Surgeon: Evans Lance, MD;  Location: [facility];  Service: Cardiovascular;  Laterality: N/A;  . MANDIBLE RECONSTRUCTION  1992  . TOENAIL EXCISION  1998   several  . TONSILLECTOMY    . TUBAL LIGATION  1977  . VAGINAL HYSTERECTOMY  1983   als0- anterior/posterior colporrhaphy  . VAGINAL WOUND CLOSURE / REPAIR  1999, 2000  . VAGINAL WOUND CLOSURE / REPAIR      Family History  Problem Relation Age of Onset  .  Hypertension Father   . Dementia Father   . Osteoarthritis Mother   . Arthritis/Rheumatoid Sister   . Breast cancer Other        paternal side  . Breast cancer Paternal Aunt   . Breast cancer Cousin        maternal  . Uterine cancer Maternal Grandmother   . Ovarian cancer Neg Hx   . Heart disease Neg Hx   . Diabetes Neg Hx   . Colon cancer Neg Hx     SOCIAL HX: reviewed.    Current Outpatient Medications:  .  acetaminophen (TYLENOL) 500 MG tablet, Take 500-1,000 mg by mouth every 6 (six) hours as needed for mild pain., Disp: , Rfl:  .  acyclovir (ZOVIRAX) 200 MG capsule, Take 1 capsule (200 mg total) by mouth 2 (two) times daily., Disp: 180 capsule, Rfl: 3 .  amLODipine (NORVASC) 2.5 MG tablet, Take 1 tablet by mouth once daily, Disp: 90 tablet, Rfl: 1 .  atorvastatin (LIPITOR) 20 MG tablet, Take 1 tablet (20 mg total) by mouth daily., Disp: 90 tablet, Rfl: 1 .  calcium-vitamin D (OSCAL WITH D) 250-125 MG-UNIT tablet, Take 1 tablet by mouth daily., Disp: , Rfl:  .  citalopram (CELEXA) 20 MG tablet, Take 1 tablet (20 mg total) by mouth daily., Disp: 30 tablet, Rfl: 2 .  clopidogrel (PLAVIX) 75 MG tablet, Take 1 tablet by mouth once daily, Disp: 90 tablet, Rfl: 1 .  folic acid (FOLVITE) 1 MG tablet, Take 1 mg by mouth daily., Disp: , Rfl:  .  levothyroxine (SYNTHROID) 50 MCG tablet, Take 1 tablet (50 mcg total) by mouth daily., Disp: 90 tablet, Rfl: 1 .  methotrexate 50 MG/2ML injection, Inject 0.76mL under the skin once a weekly on Wednesday, Disp: , Rfl:  .  Multiple Vitamin (MULTIVITAMIN WITH MINERALS) TABS tablet, Take 1 tablet by mouth daily., Disp: , Rfl:  .  pantoprazole (PROTONIX) 20 MG tablet, Take 1 tablet (20 mg total) by mouth daily., Disp: 90 tablet, Rfl: 1  EXAM:  GENERAL: alert.  Sounds to be in no acute distress.  Answering questions appropriately.   PSYCH/NEURO: pleasant and cooperative, no obvious depression or anxiety, speech and thought processing grossly  intact  ASSESSMENT AND PLAN:  Discussed the following assessment and plan:  Benign essential HTN Blood pressures have been doing well.  Continue current medication regimen.  Follow pressures.  Follow metabolic panel.   Flaccid monoplegia of upper extremity (HCC) Doing better.  Still some limitations.  Continue exercise and remaining active.  Follow.   GERD (gastroesophageal reflux disease) Controlled on current regimen.    History of CVA (cerebrovascular accident) Stable.  Getting around better.  Follow.  Continue plavix.    Hypercholesterolemia On lipitor.  Low cholesterol diet and exercise.  Follow lipid panel and liver function tests.   Hypothyroidism,  unspecified On thyroid replacement.  Follow tsh.  Mild depression (HCC) On citalopram.    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) Sees Dr Jefm Bryant.  On MTX - injections.  Stable.   Spastic hemiparesis of left nondominant side due to acute cerebral infarction (HCC) On plavix.  Evaluated by neurology.  Stable.    Dizziness Describes the unsteadiness feeling.  On questioning - states at times head feels like she has been riding on a roller coaster.  Discussed history of CVA.  Discussed therapy to help with balance.  With sore spot in her ear and intermittent dizziness, she request referral to ENT.  No sinus congestion. Slow position changes and movements.      Orders Placed This Encounter  Procedures  . Hepatic function panel    Standing Status:   Future    Standing Expiration Date:   01/26/2021  . Lipid panel    Standing Status:   Future    Standing Expiration Date:   01/26/2021  . Basic metabolic panel    Standing Status:   Future    Standing Expiration Date:   01/26/2021  . Ambulatory referral to ENT    Referral Priority:   Routine    Referral Type:   Consultation    Referral Reason:   Specialty Services Required    Requested Specialty:   Otolaryngology    Number of Visits Requested:   1     I  discussed the assessment and treatment plan with the patient. The patient was provided an opportunity to ask questions and all were answered. The patient agreed with the plan and demonstrated an understanding of the instructions.   The patient was advised to call back or seek an in-person evaluation if the symptoms worsen or if the condition fails to improve as anticipated.  I provided 23 minutes of non-face-to-face time during this encounter.   Einar Pheasant, MD

## 2020-01-22 NOTE — Telephone Encounter (Signed)
LMTCB  And schedule a 35m follow up and fasting labs before appt

## 2020-01-27 ENCOUNTER — Encounter: Payer: Self-pay | Admitting: Internal Medicine

## 2020-01-27 DIAGNOSIS — R42 Dizziness and giddiness: Secondary | ICD-10-CM | POA: Insufficient documentation

## 2020-01-27 NOTE — Assessment & Plan Note (Signed)
On lipitor.  Low cholesterol diet and exercise.  Follow lipid panel and liver function tests.   

## 2020-01-27 NOTE — Assessment & Plan Note (Signed)
Stable.  Getting around better.  Follow.  Continue plavix.

## 2020-01-27 NOTE — Assessment & Plan Note (Signed)
Sees Dr Jefm Bryant.  On MTX - injections.  Stable.

## 2020-01-27 NOTE — Assessment & Plan Note (Signed)
On plavix.  Evaluated by neurology.  Stable.

## 2020-01-27 NOTE — Assessment & Plan Note (Signed)
On thyroid replacement.  Follow tsh.  

## 2020-01-27 NOTE — Assessment & Plan Note (Signed)
Blood pressures have been doing well.  Continue current medication regimen.  Follow pressures.  Follow metabolic panel.  

## 2020-01-27 NOTE — Assessment & Plan Note (Signed)
Controlled on current regimen.   

## 2020-01-27 NOTE — Assessment & Plan Note (Signed)
On citalopram

## 2020-01-27 NOTE — Assessment & Plan Note (Signed)
Describes the unsteadiness feeling.  On questioning - states at times head feels like she has been riding on a roller coaster.  Discussed history of CVA.  Discussed therapy to help with balance.  With sore spot in her ear and intermittent dizziness, she request referral to ENT.  No sinus congestion. Slow position changes and movements.

## 2020-01-27 NOTE — Assessment & Plan Note (Signed)
Doing better.  Still some limitations.  Continue exercise and remaining active.  Follow.

## 2020-01-29 ENCOUNTER — Other Ambulatory Visit: Payer: PPO

## 2020-02-22 ENCOUNTER — Ambulatory Visit (INDEPENDENT_AMBULATORY_CARE_PROVIDER_SITE_OTHER): Payer: PPO

## 2020-02-22 ENCOUNTER — Other Ambulatory Visit: Payer: Self-pay

## 2020-02-22 ENCOUNTER — Encounter (INDEPENDENT_AMBULATORY_CARE_PROVIDER_SITE_OTHER): Payer: Self-pay

## 2020-02-22 VITALS — Ht 64.0 in | Wt 105.0 lb

## 2020-02-22 DIAGNOSIS — Z0000 Encounter for general adult medical examination without abnormal findings: Secondary | ICD-10-CM

## 2020-02-22 DIAGNOSIS — Z1159 Encounter for screening for other viral diseases: Secondary | ICD-10-CM

## 2020-02-22 NOTE — Patient Instructions (Addendum)
  Donna Rhodes , Thank you for taking time to come for your Medicare Wellness Visit. I appreciate your ongoing commitment to your health goals. Please review the following plan we discussed and let me know if I can assist you in the future.   These are the goals we discussed: Goals      Patient Stated   . I need to drink Boost as a supplement (pt-stated)    . I need to drink more water (pt-stated)       This is a list of the screening recommended for you and due dates:  Health Maintenance  Topic Date Due  .  Hepatitis C: One time screening is recommended by Center for Disease Control  (CDC) for  adults born from [birth year] through 1966.   03/04/1946  . Tetanus Vaccine  11/10/1964  . Colon Cancer Screening  10/24/2023  . Flu Shot  Completed  . DEXA scan (bone density measurement)  Completed  . Mammogram  Discontinued  . Pneumonia vaccines  Discontinued

## 2020-02-22 NOTE — Progress Notes (Addendum)
Subjective:   Donna Rhodes is a 74 y.o. female who presents for an Initial Medicare Annual Wellness Visit.  Review of Systems    No ROS.  Medicare Wellness Virtual Visit.  Visual/audio telehealth visit, UTA vital signs.   Ht/Wt provided.  See social history for additional risk factors.     Cardiac Risk Factors include: advanced age (>25men, >50 women);hypertension     Objective:    Today's Vitals   02/22/20 1036  Weight: 105 lb (47.6 kg)  Height: 5\' 4"  (1.626 m)   Body mass index is 18.02 kg/m.  Advanced Directives 02/22/2020 11/10/2017 10/27/2017 10/05/2017 09/07/2017 08/06/2017 07/27/2017  Does Patient Have a Medical Advance Directive? Yes Yes Yes No No No Yes  Type of Paramedic of [place];Living will Healthcare Power of [place];Living will - - - -  Does patient want to make changes to medical advance directive? - No - Patient declined - - - - -  Copy of [place] in Chart? No - copy requested Yes Yes - - - -  Would patient like information on creating a medical advance directive? - - - - - - -    Current Medications (verified) Outpatient Encounter Medications as of 02/22/2020  Medication Sig  . acetaminophen (TYLENOL) 500 MG tablet Take 500-1,000 mg by mouth every 6 (six) hours as needed for mild pain.  Marland Kitchen acyclovir (ZOVIRAX) 200 MG capsule Take 1 capsule (200 mg total) by mouth 2 (two) times daily.  Marland Kitchen amLODipine (NORVASC) 2.5 MG tablet Take 1 tablet by mouth once daily  . atorvastatin (LIPITOR) 20 MG tablet Take 1 tablet (20 mg total) by mouth daily.  . calcium-vitamin D (OSCAL WITH D) 250-125 MG-UNIT tablet Take 1 tablet by mouth daily.  . citalopram (CELEXA) 20 MG tablet Take 1 tablet (20 mg total) by mouth daily.  . clopidogrel (PLAVIX) 75 MG tablet Take 1 tablet by mouth once daily  . folic acid (FOLVITE) 1 MG tablet Take 1 mg by mouth daily.  Marland Kitchen levothyroxine (SYNTHROID) 50 MCG tablet Take  1 tablet (50 mcg total) by mouth daily.  . methotrexate 50 MG/2ML injection Inject 0.47mL under the skin once a weekly on Wednesday  . Multiple Vitamin (MULTIVITAMIN WITH MINERALS) TABS tablet Take 1 tablet by mouth daily.  . pantoprazole (PROTONIX) 20 MG tablet Take 1 tablet (20 mg total) by mouth daily.   No facility-administered encounter medications on file as of 02/22/2020.    Allergies (verified) Amoxicillin, Avelox [moxifloxacin hcl in nacl], Azithromycin, Bacitracin, Codeine, Estroven weight management [nutritional supplements], Food, Moxifloxacin, Naproxen, Nsaids, and Cefepime   History: Past Medical History:  Diagnosis Date  . Atrophic vaginitis   . Dysplasia of vagina    vin 1 - vulva and vaginal cuff  . GERD (gastroesophageal reflux disease)   . HCV infection   . HSV (herpes simplex virus) infection   . Hypercholesterolemia   . Hypertension   . Hypothyroidism   . Left-sided weakness    S/P CVA 4/18.  PT 2x/wk.  . Lung nodule    stable  . Menopausal state   . Osteoarthritis    hand involvement, cervical disc disease  . Osteoporosis   . Pelvic pain in female   . Rheumatoid arthritis ([place])   . Stroke (cerebrum) ([facility]) 04/24/2017  . Thyroid nodule    pt unaware of this   . Vaginal Pap smear, abnormal    lgsil   Past Surgical History:  Procedure Laterality Date  . ADENOIDECTOMY    . APPENDECTOMY  1965  . [place]   left  . CATARACT EXTRACTION W/PHACO Right 10/27/2017   Procedure: CATARACT EXTRACTION PHACO AND INTRAOCULAR LENS PLACEMENT ([place]) RIGHT;  Surgeon: Leandrew Koyanagi, MD;  Location: [facility];  Service: Ophthalmology;  Laterality: Right;  requests early (8-8:30 arrival)  . CATARACT EXTRACTION W/PHACO Left 11/10/2017   Procedure: CATARACT EXTRACTION PHACO AND INTRAOCULAR LENS PLACEMENT ([place]) LEFT;  Surgeon: Leandrew Koyanagi, MD;  Location: [facility];  Service: Ophthalmology;  Laterality: Left;  . CHOLECYSTECTOMY    .  [place]  . laser vein surgery    . LOOP RECORDER INSERTION N/A 09/24/2017   Procedure: LOOP RECORDER INSERTION;  Surgeon: Evans Lance, MD;  Location: [facility];  Service: Cardiovascular;  Laterality: N/A;  . MANDIBLE RECONSTRUCTION  1992  . TOENAIL EXCISION  1998   several  . TONSILLECTOMY    . TUBAL LIGATION  1977  . VAGINAL HYSTERECTOMY  1983   als0- anterior/posterior colporrhaphy  . VAGINAL WOUND CLOSURE / REPAIR  1999, 2000  . VAGINAL WOUND CLOSURE / REPAIR     Family History  Problem Relation Age of Onset  . Hypertension Father   . Dementia Father   . Osteoarthritis Mother   . Arthritis/Rheumatoid Sister   . Breast cancer Other        paternal side  . Breast cancer Paternal Aunt   . Breast cancer Cousin        maternal  . Uterine cancer Maternal Grandmother   . Ovarian cancer Neg Hx   . Heart disease Neg Hx   . Diabetes Neg Hx   . Colon cancer Neg Hx    Social History   Socioeconomic History  . Marital status: Divorced    Spouse name: Not on file  . Number of children: 3  . Years of education: 67  . Highest education level: Not on file  Occupational History    Comment: retired  Tobacco Use  . Smoking status: Former Smoker    Quit date: 12/29/1979    Years since quitting: 40.1  . Smokeless tobacco: Never Used  Substance and Sexual Activity  . Alcohol use: No    Alcohol/week: 0.0 standard drinks  . Drug use: No  . Sexual activity: Not Currently    Birth control/protection: Surgical  Other Topics Concern  . Not on file  Social History Narrative   Her mother and her daughter live with her   Caffeine- 1 cup coffee   Social Determinants of Health   Financial Resource Strain:   . Difficulty of Paying Living Expenses: Not on file  Food Insecurity:   . Worried About Charity fundraiser in the Last Year: Not on file  . Ran Out of Food in the Last Year: Not on file  Transportation Needs:   . Lack of Transportation (Medical): Not on  file  . Lack of Transportation (Non-Medical): Not on file  Physical Activity:   . Days of Exercise per Week: Not on file  . Minutes of Exercise per Session: Not on file  Stress:   . Feeling of Stress : Not on file  Social Connections:   . Frequency of Communication with Friends and Family: Not on file  . Frequency of Social Gatherings with Friends and Family: Not on file  . Attends Religious Services: Not on file  . Active Member of Clubs or Organizations: Not on  file  . Attends Archivist Meetings: Not on file  . Marital Status: Not on file    Tobacco Counseling Counseling given: Not Answered   Clinical Intake:  Pre-visit preparation completed: Yes        Diabetes: No  How often do you need to have someone help you when you read instructions, pamphlets, or other written materials from your doctor or pharmacy?: 1 - Never  Interpreter Needed?: No      Activities of Daily Living In your present state of health, do you have any difficulty performing the following activities: 02/22/2020  Hearing? N  Vision? N  Difficulty concentrating or making decisions? N  Walking or climbing stairs? Y  Comment Tires easily, paces herself  Dressing or bathing? N  Doing errands, shopping? N  Preparing Food and eating ? N  Using the Toilet? N  In the past six months, have you accidently leaked urine? N  Do you have problems with loss of bowel control? N  Managing your Medications? N  Managing your Finances? N  Housekeeping or managing your Housekeeping? N  Some recent data might be hidden     Immunizations and Health Maintenance Immunization History  Administered Date(s) Administered  . Fluad Quad(high Dose 65+) 12/08/2019  . Influenza Split 10/11/2013, 10/11/2014  . Influenza, High Dose Seasonal PF 10/23/2016, 11/02/2017, 09/27/2018  . Pneumococcal Conjugate-13 02/11/2018  . Pneumococcal Polysaccharide-23 07/17/2019   Health Maintenance Due  Topic Date Due  .  Hepatitis C Screening  10/06/1946  . TETANUS/TDAP  11/10/1964    Patient Care Team: Einar Pheasant, MD as PCP - General (Internal Medicine)  Indicate any recent Medical Services you may have received from other than [facility] providers in the past year (date may be approximate).     Assessment:   This is a routine wellness examination for Donna Rhodes.  Nurse connected with patient 02/22/20 at 10:30 AM EST by a telephone enabled telemedicine application and verified that I am speaking with the correct person using two identifiers. Patient stated full name and DOB. Patient gave permission to continue with virtual visit. Patient's location was at home and Nurse's location was at [place] office.   Patient is alert and oriented x3. Patient denies difficulty focusing or concentrating. Patient likes to complete puzzles for brain stimulation.   Health Maintenance Due: -Tdap vaccine- discussed; to be completed with doctor in visit or local pharmacy.   -Hep C screening consent given. See completed HM at the end of note.   Eye: Visual acuity not assessed. Virtual visit. Followed by their ophthalmologist.  Dental: UTD   Hearing: Demonstrates normal hearing during visit.  Notes she no longer needs referral to ENT; states ear soreness is relieved. Denies all other symptoms.   Safety:  Patient feels safe at home- yes Patient does have smoke detectors at home- yes Patient does wear sunscreen or protective clothing when in direct sunlight - yes Patient does wear seat belt when in a moving vehicle - yes Patient drives- yes Adequate lighting in walkways free from debris- yes Grab bars and handrails used as appropriate- yes Ambulates with an assistive device- no Cell phone on person when ambulating outside of the home- yes  Social: Alcohol intake - no     Smoking history- never   Smokers in home? none Illicit drug use? none  Medication: Taking as directed and without issues.  Pill box in use  -yes  Self managed - yes   Covid-19: Precautions and sickness symptoms discussed.  Wears mask, social distancing, hand hygiene as appropriate.   Activities of Daily Living Patient denies needing assistance with: household chores, feeding themselves, getting from bed to chair, getting to the toilet, bathing/showering, dressing, managing money, or preparing meals.   Discussed the importance of a healthy diet, water intake and the benefits of aerobic exercise.   Physical activity- active around the house; tires easily, pace yourself.   Diet:  Regular; appetite is not as good but tries to add  Water: poor intake Caffeine: 1 cup of coffee  Other Providers Patient Care Team: Einar Pheasant, MD as PCP - General (Internal Medicine)  Hearing/Vision screen  Hearing Screening   125Hz  250Hz  500Hz  1000Hz  2000Hz  3000Hz  4000Hz  6000Hz  8000Hz   Right ear:           Left ear:           Comments: Patient is able to hear conversational tones without difficulty.  No issues reported.  Vision Screening Comments: Wears corrective lenses Cataract extraction, bilateral Visual acuity not assessed, virtual visit.  They have seen their ophthalmologist in the last 12 months.     Dietary issues and exercise activities discussed: Current Exercise Habits: The patient does not participate in regular exercise at present  Goals      Patient Stated   . I need to drink Boost as a supplement (pt-stated)    . I need to drink more water (pt-stated)      Depression Screen PHQ 2/9 Scores 02/22/2020 01/22/2020 12/06/2018 05/03/2018 02/25/2018 05/26/2017 10/23/2016  PHQ - 2 Score 0 0 0 0 0 0 0    Fall Risk Fall Risk  02/22/2020 01/22/2020 02/07/2019 01/16/2019 12/06/2018  Falls in the past year? 0 0 0 1 1  Comment - - - last fall October 2019 last october 2019  Number falls in past yr: - - - 0 0  Comment - - - - -  Injury with Fall? - - - 1 1  Risk Factor Category  - - - - -  Risk for fall due to : - - - - Impaired  balance/gait  Follow up Falls evaluation completed - - - -    Timed Get Up and Go Performed no, virtual visit  Cognitive Function:     6CIT Screen 02/22/2020  What Year? 0 points  What month? 0 points  What time? 0 points  Count back from 20 0 points  Months in reverse 0 points  Repeat phrase 0 points  Total Score 0    Screening Tests Health Maintenance  Topic Date Due  . Hepatitis C Screening  June 01, 1946  . TETANUS/TDAP  11/10/1964  . COLONOSCOPY  10/24/2023  . INFLUENZA VACCINE  Completed  . DEXA SCAN  Completed  . MAMMOGRAM  Discontinued  . PNA vac Low Risk Adult  Discontinued     Plan:   Keep all routine maintenance appointments.   Next scheduled lab 04/18/20 @ 9:30  Follow up 04/22/20 @ 2:00  Patient notes she plans to call [facility] Dr. Crissie Sickles regarding tiring so easily. See Hx in chart review.   Ensure Max provided to patient; plans to pick up samples today.   Notes she no longer needs referral to ENT; states ear soreness is relieved. No pain. Denies all other symptoms. Patient will call ENT to cancel if order has already been placed.   Medicare Attestation I have personally reviewed: The patient's medical and social history Their use of alcohol, tobacco or illicit drugs Their  current medications and supplements The patient's functional ability including ADLs,fall risks, home safety risks, cognitive, and hearing and visual impairment Diet and physical activities Evidence for depression   I have reviewed and discussed with patient certain preventive protocols, quality metrics, and best practice recommendations.      Donna Biles, LPN   [id number]    Reviewed.  Agree with assessment and plan.    Dr Nicki Reaper

## 2020-02-28 ENCOUNTER — Telehealth: Payer: Self-pay | Admitting: Internal Medicine

## 2020-02-28 NOTE — Telephone Encounter (Signed)
No paperwork need on physician's end.

## 2020-02-28 NOTE — Telephone Encounter (Signed)
Patient wanted to know if there will be any paperwork that she would need to fill out before her upcoming appointment. If so, she requests that the paperwork be mailed to her and she can have someone else fill them out. She had a stroke and her handwriting is not the best. Please advise

## 2020-03-05 NOTE — Progress Notes (Deleted)
Cardiology Office Note Date:  03/05/2020  Patient ID:  Donna Rhodes, Donna Rhodes 17-Jun-1946, MRN [id number] PCP:  Einar Pheasant, MD  Cardiologist:  Dr. Lovena Le  ***refresh   Chief Complaint: *** palpitations, would like an EKG  History of Present Illness: Donna Rhodes is a 74 y.o. female with history of HTN, [name], hypothyroidism, RA, stroke >> ILR.  She comes in today to ne seen for Dr. Haig Prophet, last seen by him July 2020, at that time her device had reached EOS< she had no AFib detected through the life of her device, and the loop was removed.  *** symptoms *** meds *** labs, lipids... 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 Atrophic vaginitis   . Dysplasia of vagina    vin 1 - vulva and vaginal cuff  . GERD (gastroesophageal reflux disease)   . HCV infection   . HSV (herpes simplex virus) infection   . Hypercholesterolemia   . Hypertension   . Hypothyroidism   . Left-sided weakness    S/P CVA 4/18.  PT 2x/wk.  . Lung nodule    stable  . Menopausal state   . Osteoarthritis    hand involvement, cervical disc disease  . Osteoporosis   . Pelvic pain in female   . Rheumatoid arthritis ([place])   . 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 Thyroid nodule    pt unaware of this   . Vaginal Pap smear, abnormal    lgsil    Past Surgical History:  Procedure Laterality Date  . ADENOIDECTOMY    . APPENDECTOMY  1965  . [place]   left  . CATARACT EXTRACTION W/PHACO Right 10/27/2017   Procedure: CATARACT EXTRACTION PHACO AND INTRAOCULAR LENS PLACEMENT ([place]) RIGHT;  Surgeon: Leandrew Koyanagi, MD;  Location: [facility];  Service: Ophthalmology;  Laterality: Right;  requests early (8-8:30 arrival)  . CATARACT EXTRACTION W/PHACO Left 11/10/2017   Procedure: CATARACT EXTRACTION PHACO AND INTRAOCULAR LENS PLACEMENT ([place]) LEFT;  Surgeon: Leandrew Koyanagi, MD;  Location: [facility];  Service: Ophthalmology;  Laterality: Left;  . CHOLECYSTECTOMY    . [place]  . laser vein surgery    . LOOP RECORDER INSERTION N/A 09/24/2017   Procedure: LOOP RECORDER INSERTION;  Surgeon: [facility] Lance, MD;  Location: [facility];  Service: Cardiovascular;  Laterality: N/A;  . MANDIBLE RECONSTRUCTION  1992  . TOENAIL EXCISION  1998   several  . TONSILLECTOMY    . TUBAL LIGATION  1977  . VAGINAL HYSTERECTOMY  1983   als0- anterior/posterior colporrhaphy  . VAGINAL WOUND CLOSURE / REPAIR  1999, 2000  . VAGINAL WOUND CLOSURE / REPAIR      Current Outpatient Medications  Medication Sig Dispense Refill  . acetaminophen (TYLENOL) 500 MG tablet Take 500-1,000 mg by mouth every 6 (six) hours as needed for mild pain.    Marland Kitchen acyclovir (ZOVIRAX) 200 MG capsule Take 1 capsule (200 mg total) by mouth 2 (two) times daily. 180 capsule 3  . amLODipine (NORVASC) 2.5 MG tablet Take 1 tablet by mouth once daily 90 tablet 1  . atorvastatin (LIPITOR) 20 MG tablet Take 1 tablet (20 mg total) by mouth daily. 90 tablet 1  . calcium-vitamin D (OSCAL WITH D) 250-125 MG-UNIT tablet Take 1 tablet by mouth daily.    . citalopram (CELEXA) 20 MG tablet  Take 1 tablet (20 mg total) by mouth daily. 30 tablet 2  . clopidogrel (PLAVIX) 75 MG tablet Take 1 tablet by mouth once daily 90 tablet 1  . folic acid (FOLVITE) 1 MG tablet Take 1 mg by mouth daily.    Marland Kitchen levothyroxine (SYNTHROID) 50 MCG tablet Take 1 tablet (50 mcg total) by mouth daily. 90 tablet 1  . methotrexate 50 MG/2ML injection Inject 0.8mL under the skin once a weekly on Wednesday    . Multiple Vitamin (MULTIVITAMIN WITH MINERALS) TABS tablet Take 1 tablet by mouth daily.    . pantoprazole (PROTONIX) 20 MG tablet Take 1 tablet (20 mg total) by mouth daily. 90 tablet 1   No current facility-administered medications for this visit.    Allergies:   Amoxicillin, Avelox [moxifloxacin hcl in  nacl], Azithromycin, Bacitracin, Codeine, Estroven weight management [nutritional supplements], Food, Moxifloxacin, Naproxen, Nsaids, and Cefepime   Social History:  The patient  reports that she quit smoking about 40 years ago. She has never used smokeless tobacco. She reports that she does not drink alcohol or use drugs.   Family History:  The patient's family history includes Arthritis/Rheumatoid in her sister; Breast cancer in her cousin, paternal aunt, and another family member; Dementia in her father; Hypertension in her father; Osteoarthritis in her mother; Uterine cancer in her maternal grandmother.  ROS:  Please see the history of present illness. All other systems are reviewed and otherwise negative.   PHYSICAL EXAM: *** VS:  LMP 12/29/1981  BMI: There is no height or weight on file to calculate BMI. Well nourished, well developed, in no acute distress  HEENT: normocephalic, atraumatic  Neck: no JVD, carotid bruits or masses Cardiac:  *** RRR; no significant murmurs, no rubs, or gallops Lungs:  *** CTA b/l, no wheezing, rhonchi or rales  Abd: soft, nontender MS: no deformity or *** atrophy Ext: *** no edema  Skin: warm and dry, no rash Neuro:  No gross deficits appreciated Psych: euthymic mood, full affect  *** ILR site is stable, no tethering or discomfort   EKG:  Done today and reviewed by myself: ***   04/26/2017: TTE Study Conclusions  - Left ventricle: Wall thickness was increased in a pattern of mild  LVH. Systolic function was normal. The estimated ejection  fraction was in the range of 50% to 55%. Doppler parameters are  consistent with abnormal left ventricular relaxation (grade 1  diastolic dysfunction).  - Aortic valve: Valve area (Vmax): 2.07 cm^2.  - Mitral valve: Valve area by pressure half-time: 2.08 cm^2.     Recent Labs: 12/07/2019: ALT 19; BUN 15; Creatinine, Ser 0.71; Hemoglobin 12.7; Platelets 254.0; Potassium 4.3; Sodium 140; TSH 2.38    12/07/2019: Cholesterol 168; HDL 69.90; LDL Cholesterol 87; Total CHOL/HDL Ratio 2; Triglycerides 54.0; VLDL 10.8   CrCl cannot be calculated (Patient's most recent lab result is older than the maximum 21 days allowed.).   Wt Readings from Last 3 Encounters:  02/22/20 105 lb (47.6 kg)  01/22/20 105 lb (47.6 kg)  11/23/19 104 lb (47.2 kg)     Other studies reviewed: Additional studies/records reviewed today include: summarized above  ASSESSMENT AND PLAN:  1. HTN     ***  2. [name]     ***  3. Palpitations     ILR without AFib, removed last year     H/o cryptogenic stroke     ***   Disposition: F/u with ***  Current medicines are reviewed at length with the  patient today.  The patient did not have any concerns regarding medicines.***  Signed, Tommye Standard, PA-C 03/05/2020 5:53 AM     [facility] [address] [place] [address] [phone] (office)  [phone] (fax)

## 2020-03-06 ENCOUNTER — Ambulatory Visit: Payer: PPO | Admitting: Physician Assistant

## 2020-03-15 DIAGNOSIS — M0609 Rheumatoid arthritis without rheumatoid factor, multiple sites: Secondary | ICD-10-CM | POA: Diagnosis not present

## 2020-03-15 DIAGNOSIS — Z79899 Other long term (current) drug therapy: Secondary | ICD-10-CM | POA: Diagnosis not present

## 2020-03-25 ENCOUNTER — Telehealth: Payer: Self-pay | Admitting: Internal Medicine

## 2020-03-25 MED ORDER — CITALOPRAM HYDROBROMIDE 20 MG PO TABS: 20.0000 mg | ORAL_TABLET | Freq: Every day | ORAL | 2 refills | Status: DC

## 2020-03-25 NOTE — Telephone Encounter (Signed)
Pt called in and said she needs a refill for celexa sent in today. She said her they had to put her dog down and she called the pharmacy and they told her she didn't have any refills for this prescription.

## 2020-04-09 ENCOUNTER — Encounter: Payer: PPO | Admitting: Internal Medicine

## 2020-04-14 ENCOUNTER — Other Ambulatory Visit: Payer: Self-pay | Admitting: Internal Medicine

## 2020-04-18 ENCOUNTER — Other Ambulatory Visit (INDEPENDENT_AMBULATORY_CARE_PROVIDER_SITE_OTHER): Payer: PPO

## 2020-04-18 ENCOUNTER — Other Ambulatory Visit: Payer: Self-pay

## 2020-04-18 DIAGNOSIS — I10 Essential (primary) hypertension: Secondary | ICD-10-CM | POA: Diagnosis not present

## 2020-04-18 DIAGNOSIS — E7800 Pure hypercholesterolemia, unspecified: Secondary | ICD-10-CM

## 2020-04-18 DIAGNOSIS — Z1159 Encounter for screening for other viral diseases: Secondary | ICD-10-CM

## 2020-04-18 LAB — LIPID PANEL
Cholesterol: 152 mg/dL (ref 0–200)
HDL: 66.4 mg/dL (ref >39.00)
LDL Cholesterol: 74 mg/dL (ref 0–99)
NonHDL: 85.11
Total CHOL/HDL Ratio: 2
Triglycerides: 57 mg/dL (ref 0.0–149.0)
VLDL: 11.4 mg/dL (ref 0.0–40.0)

## 2020-04-18 LAB — BASIC METABOLIC PANEL
BUN: 15 mg/dL (ref 6–23)
CO2: 30 mEq/L (ref 19–32)
Calcium: 9.2 mg/dL (ref 8.4–10.5)
Chloride: 106 mEq/L (ref 96–112)
Creatinine, Ser: 0.72 mg/dL (ref 0.40–1.20)
GFR: 79.09 mL/min (ref >60.00)
Glucose, Bld: 83 mg/dL (ref 70–99)
Potassium: 4.5 mEq/L (ref 3.5–5.1)
Sodium: 141 mEq/L (ref 135–145)

## 2020-04-18 LAB — HEPATIC FUNCTION PANEL
ALT: 17 U/L (ref 0–35)
AST: 23 U/L (ref 0–37)
Albumin: 4.3 g/dL (ref 3.5–5.2)
Alkaline Phosphatase: 83 U/L (ref 39–117)
Bilirubin, Direct: 0.1 mg/dL (ref 0.0–0.3)
Total Bilirubin: 0.6 mg/dL (ref 0.2–1.2)
Total Protein: 6.8 g/dL (ref 6.0–8.3)

## 2020-04-19 LAB — HEPATITIS C ANTIBODY
Hepatitis C Ab: NONREACTIVE
SIGNAL TO CUT-OFF: 0.03 (ref <1.00)

## 2020-04-22 ENCOUNTER — Encounter: Payer: Self-pay | Admitting: Internal Medicine

## 2020-04-22 ENCOUNTER — Ambulatory Visit (INDEPENDENT_AMBULATORY_CARE_PROVIDER_SITE_OTHER): Payer: PPO | Admitting: Internal Medicine

## 2020-04-22 ENCOUNTER — Other Ambulatory Visit: Payer: Self-pay

## 2020-04-22 DIAGNOSIS — M0600 Rheumatoid arthritis without rheumatoid factor, unspecified site: Secondary | ICD-10-CM

## 2020-04-22 DIAGNOSIS — R0602 Shortness of breath: Secondary | ICD-10-CM | POA: Diagnosis not present

## 2020-04-22 DIAGNOSIS — I639 Cerebral infarction, unspecified: Secondary | ICD-10-CM

## 2020-04-22 DIAGNOSIS — G8114 Spastic hemiplegia affecting left nondominant side: Secondary | ICD-10-CM

## 2020-04-22 DIAGNOSIS — F320 Major depressive disorder, single episode, mild: Secondary | ICD-10-CM

## 2020-04-22 DIAGNOSIS — R42 Dizziness and giddiness: Secondary | ICD-10-CM | POA: Diagnosis not present

## 2020-04-22 DIAGNOSIS — I10 Essential (primary) hypertension: Secondary | ICD-10-CM

## 2020-04-22 DIAGNOSIS — E039 Hypothyroidism, unspecified: Secondary | ICD-10-CM | POA: Diagnosis not present

## 2020-04-22 DIAGNOSIS — K219 Gastro-esophageal reflux disease without esophagitis: Secondary | ICD-10-CM

## 2020-04-22 DIAGNOSIS — F32A Depression, unspecified: Secondary | ICD-10-CM

## 2020-04-22 DIAGNOSIS — E7800 Pure hypercholesterolemia, unspecified: Secondary | ICD-10-CM | POA: Diagnosis not present

## 2020-04-22 NOTE — Progress Notes (Signed)
Patient ID: Donna Rhodes, female   DOB: 01/18/1946, 74 y.o.   MRN: [id number]   Subjective:    Patient ID: Donna Rhodes, female    DOB: [birth year], 75 y.o.   MRN: [id number]  HPI This visit occurred during the SARS-CoV-2 public health emergency.  Safety protocols were in place, including screening questions prior to the visit, additional usage of staff PPE, and extensive cleaning of exam room while observing appropriate contact time as indicated for disinfecting solutions.  Patient here for a scheduled follow up.  She reports she is doing relatively well.  Still with increased stress.  Overall feels she is handling things relatively well.  Does not feel needs any further intervention.  Is followed by Dr Jefm Bryant.  On MTX injections.  Stable.  Does report increased fatigue.  No chest pain or tightness.  States sometimes will notice feels tired and a little "breathless".  No increased cough or congestion.  No acid reflux reported.  No abdominal pain or bowel change.  Has a f/u appt with cardiology 04/30/20.  Discussed further w/up today.  She declines EKG, etc.  Describes that since her stroke, her head has felt a little different.  No headache.  Some neck issues.  Discussed possible etiologies.  Feels is stable - since her stroke.  Discussed further w/up and evaluation.  She declines.  No headache.     Past Medical History:  Diagnosis Date  . Atrophic vaginitis   . Dysplasia of vagina    vin 1 - vulva and vaginal cuff  . GERD (gastroesophageal reflux disease)   . HCV infection   . HSV (herpes simplex virus) infection   . Hypercholesterolemia   . Hypertension   . Hypothyroidism   . Left-sided weakness    S/P CVA 4/18.  PT 2x/wk.  . Lung nodule    stable  . Menopausal state   . Osteoarthritis    hand involvement, cervical disc disease  . Osteoporosis   . Pelvic pain in female   . Rheumatoid arthritis ([place])   . Stroke (cerebrum) ([place]) 04/24/2017  . Thyroid nodule    pt unaware of  this   . Vaginal Pap smear, abnormal    lgsil   Past Surgical History:  Procedure Laterality Date  . ADENOIDECTOMY    . APPENDECTOMY  1965  . [place]   left  . CATARACT EXTRACTION W/PHACO Right 10/27/2017   Procedure: CATARACT EXTRACTION PHACO AND INTRAOCULAR LENS PLACEMENT ([place]) RIGHT;  Surgeon: Leandrew Koyanagi, MD;  Location: [facility];  Service: Ophthalmology;  Laterality: Right;  requests early (8-8:30 arrival)  . CATARACT EXTRACTION W/PHACO Left 11/10/2017   Procedure: CATARACT EXTRACTION PHACO AND INTRAOCULAR LENS PLACEMENT ([place]) LEFT;  Surgeon: Leandrew Koyanagi, MD;  Location: [facility];  Service: Ophthalmology;  Laterality: Left;  . CHOLECYSTECTOMY    . [place]  . laser vein surgery    . LOOP RECORDER INSERTION N/A 09/24/2017   Procedure: LOOP RECORDER INSERTION;  Surgeon: [place] Lance, MD;  Location: [facility];  Service: Cardiovascular;  Laterality: N/A;  . MANDIBLE RECONSTRUCTION  1992  . TOENAIL EXCISION  1998   several  . TONSILLECTOMY    . TUBAL LIGATION  1977  . VAGINAL HYSTERECTOMY  1983   als0- anterior/posterior colporrhaphy  . VAGINAL WOUND CLOSURE / REPAIR  1999, 2000  . VAGINAL WOUND CLOSURE / REPAIR     Family History  Problem Relation Age of Onset  .  Hypertension Father   . Dementia Father   . Osteoarthritis Mother   . Arthritis/Rheumatoid Sister   . Breast cancer Other        paternal side  . Breast cancer Paternal Aunt   . Breast cancer Cousin        maternal  . Uterine cancer Maternal Grandmother   . Ovarian cancer Neg Hx   . Heart disease Neg Hx   . Diabetes Neg Hx   . Colon cancer Neg Hx    Social History   Socioeconomic History  . Marital status: Divorced    Spouse name: Not on file  . Number of children: 3  . Years of education: 31  . Highest education level: Not on file  Occupational History    Comment: retired  Tobacco Use  . Smoking status: Former Smoker    Quit  date: 12/29/1979    Years since quitting: 40.3  . Smokeless tobacco: Never Used  Substance and Sexual Activity  . Alcohol use: No    Alcohol/week: 0.0 standard drinks  . Drug use: No  . Sexual activity: Not Currently    Birth control/protection: Surgical  Other Topics Concern  . Not on file  Social History Narrative   Her mother and her daughter live with her   Caffeine- 1 cup coffee   Social Determinants of Health   Financial Resource Strain:   . Difficulty of Paying Living Expenses:   Food Insecurity:   . Worried About Charity fundraiser in the Last Year:   . Arboriculturist in the Last Year:   Transportation Needs:   . Film/video editor (Medical):   Marland Kitchen Lack of Transportation (Non-Medical):   Physical Activity:   . Days of Exercise per Week:   . Minutes of Exercise per Session:   Stress:   . Feeling of Stress :   Social Connections:   . Frequency of Communication with Friends and Family:   . Frequency of Social Gatherings with Friends and Family:   . Attends Religious Services:   . Active Member of Clubs or Organizations:   . Attends Archivist Meetings:   Marland Kitchen Marital Status:     Outpatient Encounter Medications as of 04/22/2020  Medication Sig  . acetaminophen (TYLENOL) 500 MG tablet Take 500-1,000 mg by mouth every 6 (six) hours as needed for mild pain.  Marland Kitchen acyclovir (ZOVIRAX) 200 MG capsule Take 1 capsule (200 mg total) by mouth 2 (two) times daily.  Marland Kitchen amLODipine (NORVASC) 2.5 MG tablet Take 1 tablet by mouth once daily  . atorvastatin (LIPITOR) 20 MG tablet Take 1 tablet (20 mg total) by mouth daily.  . calcium-vitamin D (OSCAL WITH D) 250-125 MG-UNIT tablet Take 1 tablet by mouth daily.  . citalopram (CELEXA) 20 MG tablet Take 1 tablet (20 mg total) by mouth daily.  . clopidogrel (PLAVIX) 75 MG tablet Take 1 tablet by mouth once daily  . folic acid (FOLVITE) 1 MG tablet Take 1 mg by mouth daily.  Marland Kitchen levothyroxine (SYNTHROID) 50 MCG tablet Take 1 tablet  (50 mcg total) by mouth daily.  . methotrexate 50 MG/2ML injection Inject 0.40mL under the skin once a weekly on Wednesday  . Multiple Vitamin (MULTIVITAMIN WITH MINERALS) TABS tablet Take 1 tablet by mouth daily.  . pantoprazole (PROTONIX) 20 MG tablet Take 1 tablet by mouth once daily   No facility-administered encounter medications on file as of 04/22/2020.   Review of Systems  Constitutional: Negative for  appetite change and unexpected weight change.  HENT: Negative for congestion and sinus pressure.   Respiratory: Negative for cough and chest tightness.        Notices: "breathless" at times.  Had reported some windedness with exertion.  Does like to get out and work in her yard.  No significant problems in doing this activity.    Cardiovascular: Negative for chest pain, palpitations and leg swelling.  Gastrointestinal: Negative for abdominal pain, diarrhea, nausea and vomiting.  Genitourinary: Negative for difficulty urinating and dysuria.  Musculoskeletal: Negative for joint swelling and myalgias.  Skin: Negative for color change and rash.  Neurological: Positive for light-headedness. Negative for headaches.  Psychiatric/Behavioral: Negative for agitation and dysphoric mood.       Increased stress as outlined.         Objective:    Physical Exam Vitals reviewed.  Constitutional:      General: She is not in acute distress.    Appearance: Normal appearance.  HENT:     Head: Normocephalic and atraumatic.     Right Ear: External ear normal.     Left Ear: External ear normal.  Eyes:     General: No scleral icterus.       Right eye: No discharge.        Left eye: No discharge.     Conjunctiva/sclera: Conjunctivae normal.  Neck:     Thyroid: No thyromegaly.  Cardiovascular:     Rate and Rhythm: Normal rate and regular rhythm.  Pulmonary:     Effort: No respiratory distress.     Breath sounds: Normal breath sounds. No wheezing.  Abdominal:     General: Bowel sounds are  normal.     Palpations: Abdomen is soft.     Tenderness: There is no abdominal tenderness.  Musculoskeletal:        General: No swelling or tenderness.     Cervical back: Neck supple. No tenderness.  Lymphadenopathy:     Cervical: No cervical adenopathy.  Skin:    Findings: No erythema or rash.  Neurological:     Mental Status: She is alert.  Psychiatric:        Mood and Affect: Mood normal.        Behavior: Behavior normal.     BP 130/70   Pulse 79   Temp 97.7 F (36.5 C)   Resp 16   Ht 5\' 4"  (1.626 m)   Wt 109 lb (49.4 kg)   LMP 12/29/1981   SpO2 98%   BMI 18.71 kg/m  Wt Readings from Last 3 Encounters:  04/22/20 109 lb (49.4 kg)  02/22/20 105 lb (47.6 kg)  01/22/20 105 lb (47.6 kg)     Lab Results  Component Value Date   WBC 4.6 12/07/2019   HGB 12.7 12/07/2019   HCT 39.2 12/07/2019   PLT 254.0 12/07/2019   GLUCOSE 83 04/18/2020   CHOL 152 04/18/2020   TRIG 57.0 04/18/2020   HDL 66.40 04/18/2020   LDLDIRECT 128.0 05/25/2013   LDLCALC 74 04/18/2020   ALT 17 04/18/2020   AST 23 04/18/2020   NA 141 04/18/2020   K 4.5 04/18/2020   CL 106 04/18/2020   CREATININE 0.72 04/18/2020   BUN 15 04/18/2020   CO2 30 04/18/2020   TSH 2.38 12/07/2019   HGBA1C 5.7 (H) 04/25/2017    MM 3D SCREEN BREAST BILATERAL  Result Date: 08/23/2019 CLINICAL DATA:  Screening. EXAM: DIGITAL SCREENING BILATERAL MAMMOGRAM WITH TOMO AND CAD COMPARISON:  Previous exam(s).  ACR Breast Density Category b: There are scattered areas of fibroglandular density. FINDINGS: There are no findings suspicious for malignancy. Images were processed with CAD. IMPRESSION: No mammographic evidence of malignancy. A result letter of this screening mammogram will be mailed directly to the patient. RECOMMENDATION: Screening mammogram in one year. (Code:SM-B-01Y) BI-RADS CATEGORY  1: Negative. Electronically Signed   By: Lillia Mountain M.D.   On: 08/23/2019 14:04       Assessment & Plan:   Problem List Items  Addressed This Visit    Dizziness    States this has been a feeling she has noticed since her stroke.  Stable.  Has seen ENT.  Discussed further w/up.  She desires no further w/up or evaluation at this time.  Follow.  Slow position changes and movements.        Essential hypertension    Blood pressure as outlined.  Continue amlodipine.  Follow pressures.  Follow metabolic panel.       Relevant Orders   CBC with Differential/Platelet   Basic metabolic panel   GERD (gastroesophageal reflux disease)    Upper symptoms controlled on protonix.  Follow.       Hypercholesterolemia    On lipitor.  Low cholesterol diet and exercise.  Follow lipid panel and liver function tests.        Relevant Orders   Hepatic function panel   Lipid panel   Hypothyroidism, unspecified    On thyroid replacement.  Follow tsh.       Mild depression (HCC)    Increased stress as outlined.  Overall she feels she is handling things relatively well. On citalopram.  Does not feel needs any further intervention.  Follow.        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)    Followed by Dr Jefm Bryant.  Receiving MTX injections.  Stable.       SOB (shortness of breath)    Has noticed some sob as outlined.  Able to do her ADLs.  Works outside.  Discussed EKG.  She declines.  Discussed further w/up and evaluation including CXR, etc.  She declines at this time.  States has f/u appt with cardiology 04/30/20.  Wants to hold on further w/up and evaluation until appt with cardiology.  Follow.  Any change or worsening symptoms, will need to be evaluated.        Spastic hemiparesis of left nondominant side due to acute cerebral infarction (HCC)    On plavix.  Followed by neurology.  Stable.  Is able to do her own ADLs, etc.  Follow.            Einar Pheasant, MD

## 2020-04-28 ENCOUNTER — Encounter: Payer: Self-pay | Admitting: Internal Medicine

## 2020-04-28 DIAGNOSIS — R0602 Shortness of breath: Secondary | ICD-10-CM | POA: Insufficient documentation

## 2020-04-28 NOTE — Assessment & Plan Note (Signed)
On plavix.  Followed by neurology.  Stable.  Is able to do her own ADLs, etc.  Follow.

## 2020-04-28 NOTE — Assessment & Plan Note (Signed)
Upper symptoms controlled on protonix.  Follow.  

## 2020-04-28 NOTE — Assessment & Plan Note (Signed)
Followed by Dr Jefm Bryant.  Receiving MTX injections.  Stable.

## 2020-04-28 NOTE — Assessment & Plan Note (Signed)
On thyroid replacement.  Follow tsh.  

## 2020-04-28 NOTE — Assessment & Plan Note (Signed)
States this has been a feeling she has noticed since her stroke.  Stable.  Has seen ENT.  Discussed further w/up.  She desires no further w/up or evaluation at this time.  Follow.  Slow position changes and movements.

## 2020-04-28 NOTE — Assessment & Plan Note (Signed)
Increased stress as outlined.  Overall she feels she is handling things relatively well. On citalopram.  Does not feel needs any further intervention.  Follow.

## 2020-04-28 NOTE — Assessment & Plan Note (Signed)
On lipitor.  Low cholesterol diet and exercise.  Follow lipid panel and liver function tests.   

## 2020-04-28 NOTE — Assessment & Plan Note (Signed)
Blood pressure as outlined.  Continue amlodipine.   Follow pressures.  Follow metabolic panel.  

## 2020-04-28 NOTE — Assessment & Plan Note (Signed)
Has noticed some sob as outlined.  Able to do her ADLs.  Works outside.  Discussed EKG.  She declines.  Discussed further w/up and evaluation including CXR, etc.  She declines at this time.  States has f/u appt with cardiology 04/30/20.  Wants to hold on further w/up and evaluation until appt with cardiology.  Follow.  Any change or worsening symptoms, will need to be evaluated.

## 2020-04-30 ENCOUNTER — Ambulatory Visit: Payer: PPO | Admitting: Internal Medicine

## 2020-04-30 ENCOUNTER — Encounter: Payer: Self-pay | Admitting: Internal Medicine

## 2020-04-30 ENCOUNTER — Other Ambulatory Visit: Payer: Self-pay

## 2020-04-30 VITALS — BP 120/64 | HR 88 | Ht 64.0 in | Wt 108.0 lb

## 2020-04-30 DIAGNOSIS — I639 Cerebral infarction, unspecified: Secondary | ICD-10-CM

## 2020-04-30 DIAGNOSIS — R0602 Shortness of breath: Secondary | ICD-10-CM

## 2020-04-30 NOTE — Progress Notes (Signed)
HPI Donna Rhodes returns today for followup after undergoing ILR removal a year ago. She is a pleasant 74 yo woman who sustatined a cryptogenic stroke over 3 years ago. She initially had an ILR inserted but had no evidence of atrial fib. She is quite anxious and worried about having another stroke. She has many questions regarding the possible etiology of her stroke. She notes occaisional palpitations which were due to PVC's and PAC's when she had an ILR. Allergies  Allergen Reactions  . Amoxicillin Other (See Comments)    Has patient had a PCN reaction causing immediate rash, facial/tongue/throat swelling, SOB or lightheadedness with hypotension: No Has patient had a PCN reaction causing severe rash involving mucus membranes or skin necrosis: No Has patient had a PCN reaction that required hospitalization: No Has patient had a PCN reaction occurring within the last 10 years: No If all of the above answers are "NO", then may proceed with Cephalosporin use.   Irritated tongue  . Avelox [Moxifloxacin Hcl In Nacl]     Tongue irritation  . Azithromycin Other (See Comments)    Caused mouth and tongue to be sore  . Bacitracin     Itching and rash   . Codeine     "feels like I'm in a barrel"  . [place] [Nutritional Supplements]     Mouth soreness  . Food     Walnuts --mouth breaks out  . Moxifloxacin Other (See Comments)    Tongue irritation  . Naproxen     Tongue irritation  . Nsaids Other (See Comments)    Oral inflammation  . Cefepime Rash     Current Outpatient Medications  Medication Sig Dispense Refill  . acetaminophen (TYLENOL) 500 MG tablet Take 500-1,000 mg by mouth every 6 (six) hours as needed for mild pain.    Marland Kitchen acyclovir (ZOVIRAX) 200 MG capsule Take 1 capsule (200 mg total) by mouth 2 (two) times daily. 180 capsule 3  . amLODipine (NORVASC) 2.5 MG tablet Take 1 tablet by mouth once daily 90 tablet 1  . atorvastatin (LIPITOR) 20 MG tablet  Take 1 tablet (20 mg total) by mouth daily. 90 tablet 1  . calcium-vitamin D (OSCAL WITH D) 250-125 MG-UNIT tablet Take 1 tablet by mouth daily.    . citalopram (CELEXA) 20 MG tablet Take 1 tablet (20 mg total) by mouth daily. 30 tablet 2  . clopidogrel (PLAVIX) 75 MG tablet Take 1 tablet by mouth once daily 90 tablet 1  . folic acid (FOLVITE) 1 MG tablet Take 1 mg by mouth daily.    Marland Kitchen levothyroxine (SYNTHROID) 50 MCG tablet Take 1 tablet (50 mcg total) by mouth daily. 90 tablet 1  . methotrexate 50 MG/2ML injection Inject 0.74mL under the skin once a weekly on Wednesday    . Multiple Vitamin (MULTIVITAMIN WITH MINERALS) TABS tablet Take 1 tablet by mouth daily.    . pantoprazole (PROTONIX) 20 MG tablet Take 1 tablet by mouth once daily 90 tablet 0   No current facility-administered medications for this visit.     Past Medical History:  Diagnosis Date  . Atrophic vaginitis   . Dysplasia of vagina    vin 1 - vulva and vaginal cuff  . GERD (gastroesophageal reflux disease)   . HCV infection   . HSV (herpes simplex virus) infection   . Hypercholesterolemia   . Hypertension   . Hypothyroidism   . Left-sided weakness    S/P CVA 4/18.  PT 2x/wk.  . Lung nodule    stable  . Menopausal state   . Osteoarthritis    hand involvement, cervical disc disease  . Osteoporosis   . Pelvic pain in female   . Rheumatoid arthritis ([place])   . Stroke (cerebrum) ([place]) 04/24/2017  . Thyroid nodule    pt unaware of this   . Vaginal Pap smear, abnormal    lgsil    ROS:   All systems reviewed and negative except as noted in the HPI.   Past Surgical History:  Procedure Laterality Date  . ADENOIDECTOMY    . APPENDECTOMY  1965  . [place]   left  . CATARACT EXTRACTION W/PHACO Right 10/27/2017   Procedure: CATARACT EXTRACTION PHACO AND INTRAOCULAR LENS PLACEMENT ([place]) RIGHT;  Surgeon: Leandrew Koyanagi, MD;  Location: [facility];  Service: Ophthalmology;  Laterality: Right;   requests early (8-8:30 arrival)  . CATARACT EXTRACTION W/PHACO Left 11/10/2017   Procedure: CATARACT EXTRACTION PHACO AND INTRAOCULAR LENS PLACEMENT ([place]) LEFT;  Surgeon: Leandrew Koyanagi, MD;  Location: [facility];  Service: Ophthalmology;  Laterality: Left;  . CHOLECYSTECTOMY    . [place]  . laser vein surgery    . LOOP RECORDER INSERTION N/A 09/24/2017   Procedure: LOOP RECORDER INSERTION;  Surgeon: Evans Lance, MD;  Location: [facility];  Service: Cardiovascular;  Laterality: N/A;  . MANDIBLE RECONSTRUCTION  1992  . TOENAIL EXCISION  1998   several  . TONSILLECTOMY    . TUBAL LIGATION  1977  . VAGINAL HYSTERECTOMY  1983   als0- anterior/posterior colporrhaphy  . VAGINAL WOUND CLOSURE / REPAIR  1999, 2000  . VAGINAL WOUND CLOSURE / REPAIR       Family History  Problem Relation Age of Onset  . Hypertension Father   . Dementia Father   . Osteoarthritis Mother   . Arthritis/Rheumatoid Sister   . Breast cancer Other        paternal side  . Breast cancer Paternal Aunt   . Breast cancer Cousin        maternal  . Uterine cancer Maternal Grandmother   . Ovarian cancer Neg Hx   . Heart disease Neg Hx   . Diabetes Neg Hx   . Colon cancer Neg Hx      Social History   Socioeconomic History  . Marital status: Divorced    Spouse name: Not on file  . Number of children: 3  . Years of education: 63  . Highest education level: Not on file  Occupational History    Comment: retired  Tobacco Use  . Smoking status: Former Smoker    Quit date: 12/29/1979    Years since quitting: 40.3  . Smokeless tobacco: Never Used  Substance and Sexual Activity  . Alcohol use: No    Alcohol/week: 0.0 standard drinks  . Drug use: No  . Sexual activity: Not Currently    Birth control/protection: Surgical  Other Topics Concern  . Not on file  Social History Narrative   Her mother and her daughter live with her   Caffeine- 1 cup coffee   Social  Determinants of Health   Financial Resource Strain:   . Difficulty of Paying Living Expenses:   Food Insecurity:   . Worried About Charity fundraiser in the Last Year:   . Arboriculturist in the Last Year:   Transportation Needs:   . Film/video editor (Medical):   Marland Kitchen  Lack of Transportation (Non-Medical):   Physical Activity:   . Days of Exercise per Week:   . Minutes of Exercise per Session:   Stress:   . Feeling of Stress :   Social Connections:   . Frequency of Communication with Friends and Family:   . Frequency of Social Gatherings with Friends and Family:   . Attends Religious Services:   . Active Member of Clubs or Organizations:   . Attends Archivist Meetings:   Marland Kitchen Marital Status:   Intimate Partner Violence:   . Fear of Current or Ex-Partner:   . Emotionally Abused:   Marland Kitchen Physically Abused:   . Sexually Abused:      LMP 12/29/1981   Physical Exam:  Well appearing NAD HEENT: Unremarkable Neck:  No JVD, no thyromegally Lymphatics:  No adenopathy Back:  No CVA tenderness Lungs:  Clear HEART:  Regular rate rhythm, no murmurs, no rubs, no clicks Abd:  soft, positive bowel sounds, no organomegally, no rebound, no guarding Ext:  2 plus pulses, no edema, no cyanosis, no clubbing Skin:  No rashes no nodules Neuro:  CN II through XII intact, motor grossly intact except left hand/arm weakness.  EKG - nsr with frequent and consecutive PVC's  Assess/Plan: 1. Cryptogenic stroke - still no etiology. She will undergo watchful waiting. 2. PVC's - she will continue her current meds. She is minimally symptomatic.  3. HTN - her bp is well controlled. She will continue her current meds. 4. Anxiety - she asked about possible nerve meds but also stated she did not want to get "hooked on tranquilizers." I agreed with her. I asked her to speak to her primary MD  Mikle Bosworth.D.

## 2020-04-30 NOTE — Patient Instructions (Addendum)
Medication Instructions:  Your physician recommends that you continue on your current medications as directed. Please refer to the Current Medication list given to you today.  Labwork: None ordered.  Testing/Procedures: Your physician has requested that you have an echocardiogram. Echocardiography is a painless test that uses sound waves to create images of your heart. It provides your doctor with information about the size and shape of your heart and how well your heart's chambers and valves are working. This procedure takes approximately one hour. There are no restrictions for this procedure.  Please schedule for ECHO in [place]: Your physician wants you to follow-up in: based on results of your ECHO.  Any Other Special Instructions Will Be Listed Below (If Applicable).  If you need a refill on your cardiac medications before your next appointment, please call your pharmacy.

## 2020-05-16 DIAGNOSIS — R0600 Dyspnea, unspecified: Secondary | ICD-10-CM | POA: Diagnosis not present

## 2020-05-16 DIAGNOSIS — M8949 Other hypertrophic osteoarthropathy, multiple sites: Secondary | ICD-10-CM | POA: Diagnosis not present

## 2020-05-16 DIAGNOSIS — Z79899 Other long term (current) drug therapy: Secondary | ICD-10-CM | POA: Diagnosis not present

## 2020-05-16 DIAGNOSIS — I6389 Other cerebral infarction: Secondary | ICD-10-CM | POA: Diagnosis not present

## 2020-05-16 DIAGNOSIS — M0609 Rheumatoid arthritis without rheumatoid factor, multiple sites: Secondary | ICD-10-CM | POA: Diagnosis not present

## 2020-06-06 ENCOUNTER — Other Ambulatory Visit: Payer: PPO

## 2020-06-06 DIAGNOSIS — Z961 Presence of intraocular lens: Secondary | ICD-10-CM | POA: Diagnosis not present

## 2020-06-13 ENCOUNTER — Ambulatory Visit (INDEPENDENT_AMBULATORY_CARE_PROVIDER_SITE_OTHER): Payer: PPO

## 2020-06-13 ENCOUNTER — Other Ambulatory Visit: Payer: Self-pay

## 2020-06-13 DIAGNOSIS — R0602 Shortness of breath: Secondary | ICD-10-CM

## 2020-06-17 ENCOUNTER — Other Ambulatory Visit: Payer: Self-pay | Admitting: Internal Medicine

## 2020-06-18 ENCOUNTER — Other Ambulatory Visit: Payer: Self-pay | Admitting: Internal Medicine

## 2020-06-18 ENCOUNTER — Telehealth: Payer: Self-pay

## 2020-06-18 NOTE — Telephone Encounter (Signed)
error 

## 2020-06-19 ENCOUNTER — Other Ambulatory Visit: Payer: Self-pay | Admitting: Internal Medicine

## 2020-06-27 ENCOUNTER — Other Ambulatory Visit: Payer: Self-pay | Admitting: Internal Medicine

## 2020-06-28 ENCOUNTER — Telehealth: Payer: Self-pay | Admitting: Internal Medicine

## 2020-06-28 NOTE — Telephone Encounter (Signed)
Pt called with echo results  One year recall entered

## 2020-06-28 NOTE — Telephone Encounter (Signed)
    Pt would like to know echo results. She said if she not able to answer the phone to leave her a detailed message or send her the result in the mail.

## 2020-07-02 DIAGNOSIS — M25571 Pain in right ankle and joints of right foot: Secondary | ICD-10-CM | POA: Diagnosis not present

## 2020-07-02 DIAGNOSIS — M0609 Rheumatoid arthritis without rheumatoid factor, multiple sites: Secondary | ICD-10-CM | POA: Diagnosis not present

## 2020-08-15 DIAGNOSIS — M0609 Rheumatoid arthritis without rheumatoid factor, multiple sites: Secondary | ICD-10-CM | POA: Diagnosis not present

## 2020-08-15 DIAGNOSIS — Z79899 Other long term (current) drug therapy: Secondary | ICD-10-CM | POA: Diagnosis not present

## 2020-08-20 ENCOUNTER — Other Ambulatory Visit: Payer: Self-pay

## 2020-08-20 ENCOUNTER — Other Ambulatory Visit (INDEPENDENT_AMBULATORY_CARE_PROVIDER_SITE_OTHER): Payer: PPO

## 2020-08-20 DIAGNOSIS — I10 Essential (primary) hypertension: Secondary | ICD-10-CM

## 2020-08-20 DIAGNOSIS — E7800 Pure hypercholesterolemia, unspecified: Secondary | ICD-10-CM

## 2020-08-20 LAB — LIPID PANEL
Cholesterol: 136 mg/dL (ref 0–200)
HDL: 52.1 mg/dL (ref >39.00)
LDL Cholesterol: 72 mg/dL (ref 0–99)
NonHDL: 84.1
Total CHOL/HDL Ratio: 3
Triglycerides: 63 mg/dL (ref 0.0–149.0)
VLDL: 12.6 mg/dL (ref 0.0–40.0)

## 2020-08-20 LAB — CBC WITH DIFFERENTIAL/PLATELET
Basophils Absolute: 0.1 10*3/uL (ref 0.0–0.1)
Basophils Relative: 1.1 % (ref 0.0–3.0)
Eosinophils Absolute: 0.2 10*3/uL (ref 0.0–0.7)
Eosinophils Relative: 4.6 % (ref 0.0–5.0)
HCT: 36.8 % (ref 36.0–46.0)
Hemoglobin: 12.2 g/dL (ref 12.0–15.0)
Lymphocytes Relative: 25.2 % (ref 12.0–46.0)
Lymphs Abs: 1.3 10*3/uL (ref 0.7–4.0)
MCHC: 33.1 g/dL (ref 30.0–36.0)
MCV: 93.9 fl (ref 78.0–100.0)
Monocytes Absolute: 0.2 10*3/uL (ref 0.1–1.0)
Monocytes Relative: 4.6 % (ref 3.0–12.0)
Neutro Abs: 3.4 10*3/uL (ref 1.4–7.7)
Neutrophils Relative %: 64.5 % (ref 43.0–77.0)
Platelets: 200 10*3/uL (ref 150.0–400.0)
RBC: 3.92 Mil/uL (ref 3.87–5.11)
RDW: 15.4 % (ref 11.5–15.5)
WBC: 5.3 10*3/uL (ref 4.0–10.5)

## 2020-08-20 LAB — BASIC METABOLIC PANEL
BUN: 14 mg/dL (ref 6–23)
CO2: 29 mEq/L (ref 19–32)
Calcium: 9.3 mg/dL (ref 8.4–10.5)
Chloride: 107 mEq/L (ref 96–112)
Creatinine, Ser: 0.64 mg/dL (ref 0.40–1.20)
GFR: 90.52 mL/min (ref >60.00)
Glucose, Bld: 92 mg/dL (ref 70–99)
Potassium: 4.2 mEq/L (ref 3.5–5.1)
Sodium: 142 mEq/L (ref 135–145)

## 2020-08-20 LAB — HEPATIC FUNCTION PANEL
ALT: 47 U/L — ABNORMAL HIGH (ref 0–35)
AST: 37 U/L (ref 0–37)
Albumin: 4.2 g/dL (ref 3.5–5.2)
Alkaline Phosphatase: 86 U/L (ref 39–117)
Bilirubin, Direct: 0.1 mg/dL (ref 0.0–0.3)
Total Bilirubin: 0.6 mg/dL (ref 0.2–1.2)
Total Protein: 6.7 g/dL (ref 6.0–8.3)

## 2020-08-22 ENCOUNTER — Other Ambulatory Visit: Payer: Self-pay

## 2020-08-22 ENCOUNTER — Ambulatory Visit (INDEPENDENT_AMBULATORY_CARE_PROVIDER_SITE_OTHER): Payer: PPO | Admitting: Internal Medicine

## 2020-08-22 VITALS — BP 120/68 | HR 80 | Temp 98.1°F | Resp 16 | Ht 64.0 in | Wt 104.2 lb

## 2020-08-22 DIAGNOSIS — F32A Depression, unspecified: Secondary | ICD-10-CM

## 2020-08-22 DIAGNOSIS — Z8673 Personal history of transient ischemic attack (TIA), and cerebral infarction without residual deficits: Secondary | ICD-10-CM | POA: Diagnosis not present

## 2020-08-22 DIAGNOSIS — Z0000 Encounter for general adult medical examination without abnormal findings: Secondary | ICD-10-CM | POA: Diagnosis not present

## 2020-08-22 DIAGNOSIS — K219 Gastro-esophageal reflux disease without esophagitis: Secondary | ICD-10-CM | POA: Diagnosis not present

## 2020-08-22 DIAGNOSIS — R7989 Other specified abnormal findings of blood chemistry: Secondary | ICD-10-CM | POA: Insufficient documentation

## 2020-08-22 DIAGNOSIS — E039 Hypothyroidism, unspecified: Secondary | ICD-10-CM

## 2020-08-22 DIAGNOSIS — G8114 Spastic hemiplegia affecting left nondominant side: Secondary | ICD-10-CM | POA: Diagnosis not present

## 2020-08-22 DIAGNOSIS — E7800 Pure hypercholesterolemia, unspecified: Secondary | ICD-10-CM | POA: Diagnosis not present

## 2020-08-22 DIAGNOSIS — K1379 Other lesions of oral mucosa: Secondary | ICD-10-CM

## 2020-08-22 DIAGNOSIS — M0600 Rheumatoid arthritis without rheumatoid factor, unspecified site: Secondary | ICD-10-CM | POA: Diagnosis not present

## 2020-08-22 DIAGNOSIS — I10 Essential (primary) hypertension: Secondary | ICD-10-CM | POA: Diagnosis not present

## 2020-08-22 DIAGNOSIS — I639 Cerebral infarction, unspecified: Secondary | ICD-10-CM

## 2020-08-22 DIAGNOSIS — F320 Major depressive disorder, single episode, mild: Secondary | ICD-10-CM | POA: Diagnosis not present

## 2020-08-22 DIAGNOSIS — R945 Abnormal results of liver function studies: Secondary | ICD-10-CM

## 2020-08-22 MED ORDER — CLOTRIMAZOLE 10 MG MT TROC: OROMUCOSAL | 0 refills | Status: DC

## 2020-08-22 MED ORDER — MIRTAZAPINE 7.5 MG PO TABS: 7.5000 mg | ORAL_TABLET | Freq: Every day | ORAL | 1 refills | Status: DC

## 2020-08-22 NOTE — Assessment & Plan Note (Addendum)
Physical today 08/22/20.  PAP previously through gyn.  Mammogram 08/23/19 - Birads I.  She will schedule f/u mammogram.  Colonoscopy - 09/2013 - tortuous colon.  Recommended f/u colonoscopy in 10 years.

## 2020-08-22 NOTE — Progress Notes (Signed)
Patient ID: Donna Rhodes, female   DOB: 1946-03-30, 74 y.o.   MRN: [id number]   Subjective:    Patient ID: Donna Rhodes, female    DOB: Apr 22, 1946, 74 y.o.   MRN: [id number]  HPI This visit occurred during the SARS-CoV-2 public health emergency.  Safety protocols were in place, including screening questions prior to the visit, additional usage of staff PPE, and extensive cleaning of exam room while observing appropriate contact time as indicated for disinfecting solutions.  Patient here for her physical exam. She reports increased stress.  Discussed with her today.  She is not sleeping well.  Not eating as much.  Has lost weight.  No nausea or vomiting.  No abdominal pain.  Bowels moving.  No chest pain or sob reported.  Some discomfort in her right clavicle and right shoulder.  Discussed further evaluation and PT.  She wants to hold at this time.  Sees Dr Jefm Bryant for follow up - RA.  Tried to change to oral MTX.  Did not tolerate.  Changed back to injections. Some changes in her mouth - question of yeast.  Discussed mycelex troches.      Past Medical History:  Diagnosis Date   Atrophic vaginitis    Dysplasia of vagina    vin 1 - vulva and vaginal cuff   GERD (gastroesophageal reflux disease)    HCV infection    HSV (herpes simplex virus) infection    Hypercholesterolemia    Hypertension    Hypothyroidism    Left-sided weakness    S/P CVA 4/18.  PT 2x/wk.   Lung nodule    stable   Menopausal state    Osteoarthritis    hand involvement, cervical disc disease   Osteoporosis    Pelvic pain in female    Rheumatoid arthritis ([place])    Stroke (cerebrum) ([place]) 04/24/2017   Thyroid nodule    pt unaware of this    Vaginal Pap smear, abnormal    lgsil   Past Surgical History:  Procedure Laterality Date   [place]   left   CATARACT EXTRACTION W/PHACO Right 10/27/2017   Procedure: CATARACT EXTRACTION PHACO  AND INTRAOCULAR LENS PLACEMENT ([place]) RIGHT;  Surgeon: Leandrew Koyanagi, MD;  Location: [facility];  Service: Ophthalmology;  Laterality: Right;  requests early (8-8:30 arrival)   CATARACT EXTRACTION W/PHACO Left 11/10/2017   Procedure: CATARACT EXTRACTION PHACO AND INTRAOCULAR LENS PLACEMENT ([place]) LEFT;  Surgeon: Leandrew Koyanagi, MD;  Location: [facility];  Service: Ophthalmology;  Laterality: Left;   CHOLECYSTECTOMY     FINGER SURGERY  1982   laser vein surgery     LOOP RECORDER INSERTION N/A 09/24/2017   Procedure: LOOP RECORDER INSERTION;  Surgeon: Evans Lance, MD;  Location: [facility];  Service: Cardiovascular;  Laterality: N/A;   MANDIBLE RECONSTRUCTION  1992   TOENAIL EXCISION  1998   several   TONSILLECTOMY     TUBAL LIGATION  1977   VAGINAL HYSTERECTOMY  1983   als0- anterior/posterior colporrhaphy   VAGINAL WOUND CLOSURE / REPAIR  1999, 2000   VAGINAL WOUND CLOSURE / REPAIR     Family History  Problem Relation Age of Onset   Hypertension Father    Dementia Father    Osteoarthritis Mother    Arthritis/Rheumatoid Sister    Breast cancer Other        paternal side   Breast cancer Paternal Aunt  Breast cancer Cousin        maternal   Uterine cancer Maternal Grandmother    Ovarian cancer Neg Hx    Heart disease Neg Hx    Diabetes Neg Hx    Colon cancer Neg Hx    Social History   Socioeconomic History   Marital status: Divorced    Spouse name: Not on file   Number of children: 3   Years of education: 12   Highest education level: Not on file  Occupational History    Comment: retired  Tobacco Use   Smoking status: Former Smoker    Quit date: 12/29/1979    Years since quitting: 40.7   Smokeless tobacco: Never Used  Vaping Use   Vaping Use: Never used  Substance and Sexual Activity   Alcohol use: No    Alcohol/week: 0.0 standard drinks   Drug use: No   Sexual activity: Not Currently     Birth control/protection: Surgical  Other Topics Concern   Not on file  Social History Narrative   Her mother and her daughter live with her   Caffeine- 1 cup coffee   Social Determinants of Health   Financial Resource Strain:    Difficulty of Paying Living Expenses: Not on file  Food Insecurity:    Worried About Charity fundraiser in the Last Year: Not on file   [employer] of Food in the Last Year: Not on file  Transportation Needs:    Lack of Transportation (Medical): Not on file   Lack of Transportation (Non-Medical): Not on file  Physical Activity:    Days of Exercise per Week: Not on file   Minutes of Exercise per Session: Not on file  Stress:    Feeling of Stress : Not on file  Social Connections:    Frequency of Communication with Friends and Family: Not on file   Frequency of Social Gatherings with Friends and Family: Not on file   Attends Religious Services: Not on file   Active Member of Clubs or Organizations: Not on file   Attends Club or Organization Meetings: Not on file   Marital Status: Not on file    Outpatient Encounter Medications as of 08/22/2020  Medication Sig   acetaminophen (TYLENOL) 500 MG tablet Take 500-1,000 mg by mouth every 6 (six) hours as needed for mild pain.   acyclovir (ZOVIRAX) 200 MG capsule Take 1 capsule (200 mg total) by mouth 2 (two) times daily.   amLODipine (NORVASC) 2.5 MG tablet Take 1 tablet by mouth once daily   atorvastatin (LIPITOR) 20 MG tablet Take 1 tablet (20 mg total) by mouth daily.   calcium-vitamin D (OSCAL WITH D) 250-125 MG-UNIT tablet Take 1 tablet by mouth daily.   clopidogrel (PLAVIX) 75 MG tablet Take 1 tablet by mouth once daily   clotrimazole (MYCELEX) 10 MG troche One troche - dissolve tid   EUTHYROX 50 MCG tablet Take 1 tablet by mouth once daily   folic acid (FOLVITE) 1 MG tablet Take 1 mg by mouth daily.   methotrexate 50 MG/2ML injection Inject 0.4mL under the skin once a weekly on  Wednesday   mirtazapine (REMERON) 7.5 MG tablet Take 1 tablet (7.5 mg total) by mouth at bedtime.   Multiple Vitamin (MULTIVITAMIN WITH MINERALS) TABS tablet Take 1 tablet by mouth daily.   pantoprazole (PROTONIX) 20 MG tablet Take 1 tablet by mouth once daily   [DISCONTINUED] citalopram (CELEXA) 20 MG tablet Take 1 tablet (20 mg total)  by mouth daily.   No facility-administered encounter medications on file as of 08/22/2020.    Review of Systems  Constitutional:       Weight is down.  Discussed increased po intake.   HENT: Negative for congestion and sinus pressure.   Eyes: Negative for pain and visual disturbance.  Respiratory: Negative for cough, chest tightness and shortness of breath.   Cardiovascular: Negative for chest pain, palpitations and leg swelling.  Gastrointestinal: Negative for abdominal pain, diarrhea, nausea and vomiting.  Genitourinary: Negative for difficulty urinating and dysuria.  Musculoskeletal: Negative for myalgias.       Followed by Dr Jefm Bryant for RA.   Skin: Negative for color change and rash.  Neurological: Negative for dizziness, light-headedness and headaches.  Hematological: Negative for adenopathy. Does not bruise/bleed easily.  Psychiatric/Behavioral: Negative for agitation and dysphoric mood.       Increased stress.        Objective:    Physical Exam Vitals reviewed.  Constitutional:      General: She is not in acute distress.    Appearance: Normal appearance. She is well-developed.  HENT:     Head: Normocephalic and atraumatic.     Right Ear: External ear normal.     Left Ear: External ear normal.  Eyes:     General: No scleral icterus.       Right eye: No discharge.        Left eye: No discharge.     Conjunctiva/sclera: Conjunctivae normal.  Neck:     Thyroid: No thyromegaly.  Cardiovascular:     Rate and Rhythm: Normal rate and regular rhythm.  Pulmonary:     Effort: No tachypnea, accessory muscle usage or respiratory  distress.     Breath sounds: Normal breath sounds. No decreased breath sounds or wheezing.  Chest:     Breasts:        Right: No inverted nipple, mass, nipple discharge or tenderness (no axillary adenopathy).        Left: No inverted nipple, mass, nipple discharge or tenderness (no axilarry adenopathy).  Abdominal:     General: Bowel sounds are normal.     Palpations: Abdomen is soft.     Tenderness: There is no abdominal tenderness.  Musculoskeletal:        General: No swelling or tenderness.     Cervical back: Neck supple. No tenderness.  Lymphadenopathy:     Cervical: No cervical adenopathy.  Skin:    Findings: No erythema or rash.  Neurological:     Mental Status: She is alert and oriented to person, place, and time.  Psychiatric:        Mood and Affect: Mood normal.        Behavior: Behavior normal.     BP 120/68    Pulse 80    Temp 98.1 F (36.7 C)    Resp 16    Ht 5\' 4"  (1.626 m)    Wt 104 lb 3.2 oz (47.3 kg)    LMP 12/29/1981    SpO2 98%    BMI 17.89 kg/m  Wt Readings from Last 3 Encounters:  08/22/20 104 lb 3.2 oz (47.3 kg)  04/30/20 108 lb (49 kg)  04/22/20 109 lb (49.4 kg)     Lab Results  Component Value Date   WBC 5.3 08/20/2020   HGB 12.2 08/20/2020   HCT 36.8 08/20/2020   PLT 200.0 08/20/2020   GLUCOSE 92 08/20/2020   CHOL 136 08/20/2020   TRIG 63.0  08/20/2020   HDL 52.10 08/20/2020   LDLDIRECT 128.0 05/25/2013   LDLCALC 72 08/20/2020   ALT 47 (H) 08/20/2020   AST 37 08/20/2020   NA 142 08/20/2020   K 4.2 08/20/2020   CL 107 08/20/2020   CREATININE 0.64 08/20/2020   BUN 14 08/20/2020   CO2 29 08/20/2020   TSH 2.38 12/07/2019   HGBA1C 5.7 (H) 04/25/2017    MM 3D SCREEN BREAST BILATERAL  Result Date: 08/23/2019 CLINICAL DATA:  Screening. EXAM: DIGITAL SCREENING BILATERAL MAMMOGRAM WITH TOMO AND CAD COMPARISON:  Previous exam(s). ACR Breast Density Category b: There are scattered areas of fibroglandular density. FINDINGS: There are no findings  suspicious for malignancy. Images were processed with CAD. IMPRESSION: No mammographic evidence of malignancy. A result letter of this screening mammogram will be mailed directly to the patient. RECOMMENDATION: Screening mammogram in one year. (Code:SM-B-01Y) BI-RADS CATEGORY  1: Negative. Electronically Signed   By: Lillia Mountain M.D.   On: 08/23/2019 14:04       Assessment & Plan:   Problem List Items Addressed This Visit    Spastic hemiparesis of left nondominant side due to acute cerebral infarction [facility])    Followed by neurology.  On plavix.  Stable.  Follow.        Sore mouth    mycelex troches.  Notify me if persistent.        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)    Followed by Dr Jefm Bryant.  On MTX - injections.  Follow.        Mild depression (HCC)    Increased stress as outlined.  Discussed with her today.  She is not sleeping well.  Some decreased po intake.  Discussed treatment options.  Start remeron 7.5mg  q hs.  Follow.        Relevant Medications   mirtazapine (REMERON) 7.5 MG tablet   Hypothyroidism, unspecified    On thyroid replacement.  Follow tsh.       Hypercholesterolemia    On lipitor.  Low cholesterol diet and exercise.  Follow lipid panel and liver function tests.        History of CVA (cerebrovascular accident)    Stable. On plavix.  Follow.       Health care maintenance    Physical today 08/22/20.  PAP previously through gyn.  Mammogram 08/23/19 - Birads I.  She will schedule f/u mammogram.  Colonoscopy - 09/2013 - tortuous colon.  Recommended f/u colonoscopy in 10 years.       GERD (gastroesophageal reflux disease)    No upper symptoms reported on protonix.  Follow.       Essential hypertension    Blood pressures as outlined.  Continue amlodipine.  Follow pressures.  Follow metabolic panel.       Abnormal liver function test    Follow liver function tests.       Relevant Orders   Hepatic function panel        Einar Pheasant, MD

## 2020-08-27 ENCOUNTER — Telehealth: Payer: Self-pay | Admitting: Internal Medicine

## 2020-08-27 NOTE — Telephone Encounter (Signed)
Patient stated that she took one pill and it made her very groggy and sluggish so she is not going to take anymore. No acute symptoms. Patient has been off medication for 2 days. Back to normal. Patient does not want to try anything else. She will let us know if she needs anything.

## 2020-08-27 NOTE — Telephone Encounter (Signed)
Agree with stopping.  Let us know if needs anything.

## 2020-08-27 NOTE — Telephone Encounter (Signed)
Pt called and said that she will not be taking the mirtazapine (REMERON) 7.5 MG tablet anymore  She doesn't like the way it makes her feel

## 2020-09-01 ENCOUNTER — Encounter: Payer: Self-pay | Admitting: Internal Medicine

## 2020-09-01 DIAGNOSIS — K1379 Other lesions of oral mucosa: Secondary | ICD-10-CM | POA: Insufficient documentation

## 2020-09-01 NOTE — Assessment & Plan Note (Signed)
Followed by neurology.  On plavix.  Stable.  Follow.

## 2020-09-01 NOTE — Assessment & Plan Note (Signed)
Increased stress as outlined.  Discussed with her today.  She is not sleeping well.  Some decreased po intake.  Discussed treatment options.  Start remeron 7.5mg  q hs.  Follow.

## 2020-09-01 NOTE — Assessment & Plan Note (Signed)
Follow liver function tests.   

## 2020-09-01 NOTE — Assessment & Plan Note (Signed)
On lipitor.  Low cholesterol diet and exercise.  Follow lipid panel and liver function tests.   

## 2020-09-01 NOTE — Assessment & Plan Note (Signed)
Stable. On plavix.  Follow.

## 2020-09-01 NOTE — Assessment & Plan Note (Signed)
Blood pressures as outlined.  Continue amlodipine.  Follow pressures.  Follow metabolic panel.

## 2020-09-01 NOTE — Assessment & Plan Note (Signed)
On thyroid replacement.  Follow tsh.  

## 2020-09-01 NOTE — Assessment & Plan Note (Signed)
No upper symptoms reported on protonix.  Follow.

## 2020-09-01 NOTE — Assessment & Plan Note (Signed)
mycelex troches.  Notify me if persistent.

## 2020-09-01 NOTE — Assessment & Plan Note (Signed)
Followed by Dr Jefm Bryant.  On MTX - injections.  Follow.

## 2020-09-12 ENCOUNTER — Other Ambulatory Visit (INDEPENDENT_AMBULATORY_CARE_PROVIDER_SITE_OTHER): Payer: PPO

## 2020-09-12 ENCOUNTER — Other Ambulatory Visit: Payer: Self-pay

## 2020-09-12 DIAGNOSIS — R945 Abnormal results of liver function studies: Secondary | ICD-10-CM

## 2020-09-12 DIAGNOSIS — R7989 Other specified abnormal findings of blood chemistry: Secondary | ICD-10-CM

## 2020-09-12 LAB — HEPATIC FUNCTION PANEL
ALT: 44 U/L — ABNORMAL HIGH (ref 0–35)
AST: 31 U/L (ref 0–37)
Albumin: 4.4 g/dL (ref 3.5–5.2)
Alkaline Phosphatase: 91 U/L (ref 39–117)
Bilirubin, Direct: 0.2 mg/dL (ref 0.0–0.3)
Total Bilirubin: 0.7 mg/dL (ref 0.2–1.2)
Total Protein: 7 g/dL (ref 6.0–8.3)

## 2020-09-14 ENCOUNTER — Other Ambulatory Visit: Payer: Self-pay | Admitting: Internal Medicine

## 2020-09-16 ENCOUNTER — Telehealth: Payer: Self-pay | Admitting: Internal Medicine

## 2020-09-16 DIAGNOSIS — Z1231 Encounter for screening mammogram for malignant neoplasm of breast: Secondary | ICD-10-CM

## 2020-09-16 NOTE — Telephone Encounter (Signed)
Received notice she is overdue mammogram.  Need to schedule.  Thanks.

## 2020-09-17 ENCOUNTER — Other Ambulatory Visit: Payer: Self-pay | Admitting: Internal Medicine

## 2020-09-17 ENCOUNTER — Telehealth: Payer: Self-pay | Admitting: Internal Medicine

## 2020-09-17 NOTE — Telephone Encounter (Signed)
Pt would like a call back today before 4 if possible about lab results

## 2020-09-18 ENCOUNTER — Telehealth: Payer: Self-pay | Admitting: Internal Medicine

## 2020-09-18 DIAGNOSIS — R300 Dysuria: Secondary | ICD-10-CM

## 2020-09-18 NOTE — Telephone Encounter (Signed)
Pt states that she would like meds for a possible UTI. Pt states that she had chills yesterday and trouble urinating for two days. Please advise

## 2020-09-18 NOTE — Telephone Encounter (Signed)
Spoke with patient this morning. Results not in yet.

## 2020-09-18 NOTE — Telephone Encounter (Signed)
I tried to call patient, but line was busy.

## 2020-09-18 NOTE — Telephone Encounter (Signed)
Called patient and offered to schedule mammogram. Declined and said she would call and schedule because she had some things going on with her mom. Provided information to patient and advised to let me know if she needs anything.

## 2020-09-19 DIAGNOSIS — N3001 Acute cystitis with hematuria: Secondary | ICD-10-CM | POA: Diagnosis not present

## 2020-09-19 DIAGNOSIS — N390 Urinary tract infection, site not specified: Secondary | ICD-10-CM | POA: Diagnosis not present

## 2020-09-19 LAB — POCT URINALYSIS DIPSTICK

## 2020-09-19 NOTE — Telephone Encounter (Signed)
Attempted to reach patient to discuss. LMTCB

## 2020-09-19 NOTE — Telephone Encounter (Signed)
Pt called to follow up on her having a UTI an getting an appt in office I told pt that she couldn't come in because she is having chills.  Pt stated that she would just find another Doctor since she cant be seen in office with symptoms and that if she is having symptoms in the future she would just lie about them  She stated that she would go to [facility]

## 2020-09-20 MED ORDER — NITROFURANTOIN MONOHYD MACRO 100 MG PO CAPS: 100.0000 mg | ORAL_CAPSULE | Freq: Two times a day (BID) | ORAL | 0 refills | Status: DC

## 2020-09-20 NOTE — Telephone Encounter (Signed)
No problem with macrobid. She has had this medication in the past.

## 2020-09-20 NOTE — Telephone Encounter (Signed)
Spoke with patient and urgent care. Urgent care saw pt for UTI. Prescribed augmentin. Pharmacy stated that there was a contraindication with her methotrexate and allergies. Patient says that she is not allergic to amoxicillin that she knows of. She could not recall any abx that she has taken in the past. They are waiting on culture results but patient would like to have you prescribe something for her to start today. Only symptom is painful urination and chills. Confirmed no other acute symptoms.

## 2020-09-20 NOTE — Telephone Encounter (Signed)
Pt came in to office and wanted a call back ASAP about meds for UTI

## 2020-09-20 NOTE — Telephone Encounter (Signed)
Patient is aware 

## 2020-09-20 NOTE — Telephone Encounter (Signed)
Reviewed urgent care note and urinalysis.  Amoxicillin listed as allegy.  Would hold on augmentin.  I am going to send in macrobid 100mg  bid x 5 days , unless she is aware of a problem with this medication.

## 2020-09-20 NOTE — Telephone Encounter (Signed)
rx sent in for macrobid

## 2020-09-21 ENCOUNTER — Other Ambulatory Visit: Payer: Self-pay | Admitting: Internal Medicine

## 2020-09-21 DIAGNOSIS — R7989 Other specified abnormal findings of blood chemistry: Secondary | ICD-10-CM

## 2020-09-21 NOTE — Progress Notes (Signed)
Order placed for abdominal ultrasound.   

## 2020-09-23 ENCOUNTER — Telehealth: Payer: Self-pay | Admitting: Internal Medicine

## 2020-09-23 ENCOUNTER — Other Ambulatory Visit
Admission: RE | Admit: 2020-09-23 | Discharge: 2020-09-23 | Disposition: A | Discharge status: Home / Self Care | Payer: PPO | Source: Ambulatory Visit | Attending: Internal Medicine | Admitting: Internal Medicine

## 2020-09-23 DIAGNOSIS — R300 Dysuria: Secondary | ICD-10-CM | POA: Diagnosis not present

## 2020-09-23 LAB — URINALYSIS, ROUTINE W REFLEX MICROSCOPIC
Bilirubin Urine: NEGATIVE
Glucose, UA: NEGATIVE mg/dL
Hgb urine dipstick: NEGATIVE
Ketones, ur: NEGATIVE mg/dL
Leukocytes,Ua: NEGATIVE
Nitrite: NEGATIVE
Protein, ur: NEGATIVE mg/dL
Specific Gravity, Urine: 1.023 (ref 1.005–1.030)
pH: 5 (ref 5.0–8.0)

## 2020-09-23 NOTE — Telephone Encounter (Signed)
Is there another abx you would like to try for her UTI? She has stopped the macrobid. I have called [facility] to see if her urine culture resulted over the weekend.

## 2020-09-23 NOTE — Telephone Encounter (Signed)
Pt called and said that the Macrobid caused her to have a rash at her ankles she stopped taking the medication and the rash is slowly going away  She said that it is still red and slightly warm to the touch She stopped taking the medication  Please call pt back    Pt said that she has a dentist appt and will be unavailable from 9:45 to 11:30

## 2020-09-23 NOTE — Telephone Encounter (Signed)
Is she having any urinary symptoms now?  How long did she take the abx.  If no symptom now, would recommend recheck urine and culture and see if clear.

## 2020-09-23 NOTE — Telephone Encounter (Signed)
No urinary symptoms. Took 5 doses of medication. [facility] to [facility].

## 2020-09-23 NOTE — Telephone Encounter (Signed)
err

## 2020-09-24 LAB — URINE CULTURE: Culture: <10000 — AB

## 2020-09-25 ENCOUNTER — Ambulatory Visit
Admission: RE | Admit: 2020-09-25 | Discharge: 2020-09-25 | Disposition: A | Discharge status: Home / Self Care | Payer: PPO | Source: Ambulatory Visit | Attending: Internal Medicine | Admitting: Internal Medicine

## 2020-09-25 ENCOUNTER — Other Ambulatory Visit: Payer: Self-pay

## 2020-09-25 DIAGNOSIS — R7989 Other specified abnormal findings of blood chemistry: Secondary | ICD-10-CM | POA: Diagnosis not present

## 2020-09-25 DIAGNOSIS — R945 Abnormal results of liver function studies: Secondary | ICD-10-CM | POA: Insufficient documentation

## 2020-09-25 DIAGNOSIS — I700 Atherosclerosis of aorta: Secondary | ICD-10-CM | POA: Diagnosis not present

## 2020-09-25 DIAGNOSIS — K7689 Other specified diseases of liver: Secondary | ICD-10-CM | POA: Diagnosis not present

## 2020-09-25 DIAGNOSIS — K760 Fatty (change of) liver, not elsewhere classified: Secondary | ICD-10-CM | POA: Diagnosis not present

## 2020-09-26 ENCOUNTER — Other Ambulatory Visit: Payer: Self-pay | Admitting: Internal Medicine

## 2020-09-27 ENCOUNTER — Telehealth: Payer: Self-pay | Admitting: Internal Medicine

## 2020-09-27 MED ORDER — CLOPIDOGREL BISULFATE 75 MG PO TABS
75.0000 mg | ORAL_TABLET | Freq: Every day | ORAL | 0 refills | Status: DC
Start: 2020-09-27 — End: 2020-10-01

## 2020-09-27 NOTE — Telephone Encounter (Signed)
Pt needs a refill on clopidogrel (PLAVIX) 75 MG tablet sent to [facility]  Pt will be out of medication on Saturday

## 2020-09-30 ENCOUNTER — Telehealth: Payer: Self-pay | Admitting: Internal Medicine

## 2020-09-30 NOTE — Telephone Encounter (Signed)
Pt notified of [place] result

## 2020-09-30 NOTE — Telephone Encounter (Signed)
Patient called in wanting results from MRI

## 2020-10-01 ENCOUNTER — Telehealth: Payer: Self-pay | Admitting: Internal Medicine

## 2020-10-01 MED ORDER — CLOPIDOGREL BISULFATE 75 MG PO TABS
75.0000 mg | ORAL_TABLET | Freq: Every day | ORAL | 0 refills | Status: DC
Start: 2020-10-01 — End: 2021-03-07

## 2020-10-01 MED ORDER — LEVOTHYROXINE SODIUM 50 MCG PO TABS: 50.0000 ug | ORAL_TABLET | Freq: Every day | ORAL | 0 refills | Status: DC

## 2020-10-01 MED ORDER — ATORVASTATIN CALCIUM 20 MG PO TABS
20.0000 mg | ORAL_TABLET | Freq: Every day | ORAL | 1 refills | Status: DC
Start: 2020-10-01 — End: 2021-03-07

## 2020-10-01 MED ORDER — MIRTAZAPINE 7.5 MG PO TABS
7.5000 mg | ORAL_TABLET | Freq: Every day | ORAL | 1 refills | Status: DC
Start: 2020-10-01 — End: 2020-10-17

## 2020-10-01 MED ORDER — AMLODIPINE BESYLATE 2.5 MG PO TABS
2.5000 mg | ORAL_TABLET | Freq: Every day | ORAL | 0 refills | Status: DC
Start: 2020-10-01 — End: 2020-12-12

## 2020-10-01 MED ORDER — ACETAMINOPHEN 500 MG PO TABS: 500.0000 mg | ORAL_TABLET | Freq: Four times a day (QID) | ORAL | 0 refills | Status: AC | PRN

## 2020-10-01 MED ORDER — ACYCLOVIR 200 MG PO CAPS
200.0000 mg | ORAL_CAPSULE | Freq: Two times a day (BID) | ORAL | 3 refills | Status: DC
Start: 2020-10-01 — End: 2022-04-22

## 2020-10-01 MED ORDER — PANTOPRAZOLE SODIUM 20 MG PO TBEC
20.0000 mg | DELAYED_RELEASE_TABLET | Freq: Every day | ORAL | 0 refills | Status: DC
Start: 2020-10-01 — End: 2021-05-12

## 2020-10-01 NOTE — Telephone Encounter (Signed)
Patient called in stated that she need all of medications sent to [facility] on [address] in [place], also she wanted the results from her lab work

## 2020-10-01 NOTE — Telephone Encounter (Signed)
Medication has been refilled. Please inform patient once you receive lab results per patient request.

## 2020-10-02 NOTE — Telephone Encounter (Signed)
See result note.  

## 2020-10-15 ENCOUNTER — Other Ambulatory Visit: Payer: Self-pay

## 2020-10-15 ENCOUNTER — Ambulatory Visit
Admission: RE | Admit: 2020-10-15 | Discharge: 2020-10-15 | Disposition: A | Discharge status: Home / Self Care | Payer: PPO | Source: Ambulatory Visit | Attending: Internal Medicine | Admitting: Internal Medicine

## 2020-10-15 DIAGNOSIS — Z1231 Encounter for screening mammogram for malignant neoplasm of breast: Secondary | ICD-10-CM | POA: Diagnosis not present

## 2020-10-17 ENCOUNTER — Encounter: Payer: Self-pay | Admitting: Internal Medicine

## 2020-10-17 ENCOUNTER — Ambulatory Visit (INDEPENDENT_AMBULATORY_CARE_PROVIDER_SITE_OTHER): Payer: PPO | Admitting: Internal Medicine

## 2020-10-17 ENCOUNTER — Other Ambulatory Visit: Payer: Self-pay

## 2020-10-17 VITALS — BP 122/70 | HR 59 | Temp 97.9°F | Ht 64.0 in | Wt 103.0 lb

## 2020-10-17 DIAGNOSIS — M0600 Rheumatoid arthritis without rheumatoid factor, unspecified site: Secondary | ICD-10-CM

## 2020-10-17 DIAGNOSIS — F32A Depression, unspecified: Secondary | ICD-10-CM

## 2020-10-17 DIAGNOSIS — I10 Essential (primary) hypertension: Secondary | ICD-10-CM | POA: Diagnosis not present

## 2020-10-17 DIAGNOSIS — R7989 Other specified abnormal findings of blood chemistry: Secondary | ICD-10-CM

## 2020-10-17 DIAGNOSIS — G8320 Monoplegia of upper limb affecting unspecified side: Secondary | ICD-10-CM | POA: Diagnosis not present

## 2020-10-17 DIAGNOSIS — Z23 Encounter for immunization: Secondary | ICD-10-CM

## 2020-10-17 DIAGNOSIS — R945 Abnormal results of liver function studies: Secondary | ICD-10-CM

## 2020-10-17 DIAGNOSIS — F320 Major depressive disorder, single episode, mild: Secondary | ICD-10-CM

## 2020-10-17 DIAGNOSIS — E039 Hypothyroidism, unspecified: Secondary | ICD-10-CM

## 2020-10-17 DIAGNOSIS — E7800 Pure hypercholesterolemia, unspecified: Secondary | ICD-10-CM

## 2020-10-17 DIAGNOSIS — Z8673 Personal history of transient ischemic attack (TIA), and cerebral infarction without residual deficits: Secondary | ICD-10-CM | POA: Diagnosis not present

## 2020-10-17 NOTE — Progress Notes (Signed)
Patient ID: Donna Rhodes, female   DOB: 27-Jan-1946, 74 y.o.   MRN: [id number]   Subjective:    Patient ID: Donna Rhodes, female    DOB: 1946/09/27, 74 y.o.   MRN: [id number]  HPI This visit occurred during the SARS-CoV-2 public health emergency.  Safety protocols were in place, including screening questions prior to the visit, additional usage of staff PPE, and extensive cleaning of exam room while observing appropriate contact time as indicated for disinfecting solutions.  Patient here for a scheduled follow up.  Here to f/u regarding her cholesterol, blood pressure and discuss labs.  Concerned regarding elevated liver function test.  On MTX for arthritis.  Also on statin medication.  She was questioning if she needed to stop her medication.  Discussed with her that one liver test was slightly increased and that I recommended she continue her medication.  Recent ultrasound - changes c/w fatty liver.  No other acute abnormality.  Discussed importance of remaining on her cholesterol medication.  She is eating.  Weight is stable.  No nausea or vomiting.  No chest pain or sob.    Past Medical History:  Diagnosis Date  . Atrophic vaginitis   . Dysplasia of vagina    vin 1 - vulva and vaginal cuff  . GERD (gastroesophageal reflux disease)   . HCV infection   . HSV (herpes simplex virus) infection   . Hypercholesterolemia   . Hypertension   . Hypothyroidism   . Left-sided weakness    S/P CVA 4/18.  PT 2x/wk.  . Lung nodule    stable  . Menopausal state   . Osteoarthritis    hand involvement, cervical disc disease  . Osteoporosis   . Pelvic pain in female   . Rheumatoid arthritis ([place])   . 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 Thyroid nodule    pt unaware of this   . Vaginal Pap smear, abnormal    lgsil   Past Surgical History:  Procedure Laterality Date  . ADENOIDECTOMY    . APPENDECTOMY  1965  . [place]   left  . CATARACT EXTRACTION W/PHACO Right 10/27/2017    Procedure: CATARACT EXTRACTION PHACO AND INTRAOCULAR LENS PLACEMENT ([place]) RIGHT;  Surgeon: Leandrew Koyanagi, MD;  Location: [facility];  Service: Ophthalmology;  Laterality: Right;  requests early (8-8:30 arrival)  . CATARACT EXTRACTION W/PHACO Left 11/10/2017   Procedure: CATARACT EXTRACTION PHACO AND INTRAOCULAR LENS PLACEMENT ([place]) LEFT;  Surgeon: Leandrew Koyanagi, MD;  Location: [facility];  Service: Ophthalmology;  Laterality: Left;  . CHOLECYSTECTOMY    . [place]  . laser vein surgery    . LOOP RECORDER INSERTION N/A 09/24/2017   Procedure: LOOP RECORDER INSERTION;  Surgeon: Evans Lance, MD;  Location: [facility];  Service: Cardiovascular;  Laterality: N/A;  . MANDIBLE RECONSTRUCTION  1992  . TOENAIL EXCISION  1998   several  . TONSILLECTOMY    . TUBAL LIGATION  1977  . VAGINAL HYSTERECTOMY  1983   als0- anterior/posterior colporrhaphy  . VAGINAL WOUND CLOSURE / REPAIR  1999, 2000  . VAGINAL WOUND CLOSURE / REPAIR     Family History  Problem Relation Age of Onset  . Hypertension Father   . Dementia Father   . Osteoarthritis Mother   . Arthritis/Rheumatoid Sister   . Breast cancer Other        paternal side  . Breast cancer Paternal Aunt   . Breast cancer Cousin  maternal  . Uterine cancer Maternal Grandmother   . Ovarian cancer Neg Hx   . Heart disease Neg Hx   . Diabetes Neg Hx   . Colon cancer Neg Hx    Social History   Socioeconomic History  . Marital status: Divorced    Spouse name: Not on file  . Number of children: 3  . Years of education: 15  . Highest education level: Not on file  Occupational History    Comment: retired  Tobacco Use  . Smoking status: Former Smoker    Quit date: 12/29/1979    Years since quitting: 40.8  . Smokeless tobacco: Never Used  Vaping Use  . Vaping Use: Never used  Substance and Sexual Activity  . Alcohol use: No    Alcohol/week: 0.0 standard drinks  . Drug use: No  .  Sexual activity: Not Currently    Birth control/protection: Surgical  Other Topics Concern  . Not on file  Social History Narrative   Her mother and her daughter live with her   Caffeine- 1 cup coffee   Social Determinants of Health   Financial Resource Strain:   . Difficulty of Paying Living Expenses: Not on file  Food Insecurity:   . Worried About Charity fundraiser in the Last Year: Not on file  . Ran Out of Food in the Last Year: Not on file  Transportation Needs:   . Lack of Transportation (Medical): Not on file  . Lack of Transportation (Non-Medical): Not on file  Physical Activity:   . Days of Exercise per Week: Not on file  . Minutes of Exercise per Session: Not on file  Stress:   . Feeling of Stress : Not on file  Social Connections:   . Frequency of Communication with Friends and Family: Not on file  . Frequency of Social Gatherings with Friends and Family: Not on file  . Attends Religious Services: Not on file  . Active Member of Clubs or Organizations: Not on file  . Attends Archivist Meetings: Not on file  . Marital Status: Not on file    Outpatient Encounter Medications as of 10/17/2020  Medication Sig  . acetaminophen (TYLENOL) 500 MG tablet Take 1-2 tablets (500-1,000 mg total) by mouth every 6 (six) hours as needed for mild pain.  Marland Kitchen acyclovir (ZOVIRAX) 200 MG capsule Take 1 capsule (200 mg total) by mouth 2 (two) times daily.  Marland Kitchen amLODipine (NORVASC) 2.5 MG tablet Take 1 tablet (2.5 mg total) by mouth daily.  Marland Kitchen atorvastatin (LIPITOR) 20 MG tablet Take 1 tablet (20 mg total) by mouth daily.  . calcium-vitamin D (OSCAL WITH D) 250-125 MG-UNIT tablet Take 1 tablet by mouth daily.  . clopidogrel (PLAVIX) 75 MG tablet Take 1 tablet (75 mg total) by mouth daily.  . clotrimazole (MYCELEX) 10 MG troche One troche - dissolve tid  . folic acid (FOLVITE) 1 MG tablet Take 1 mg by mouth daily.  Marland Kitchen levothyroxine (EUTHYROX) 50 MCG tablet Take 1 tablet (50 mcg  total) by mouth daily.  . methotrexate 50 MG/2ML injection Inject 0.63mL under the skin once a weekly on Wednesday  . Multiple Vitamin (MULTIVITAMIN WITH MINERALS) TABS tablet Take 1 tablet by mouth daily.  . pantoprazole (PROTONIX) 20 MG tablet Take 1 tablet (20 mg total) by mouth daily.  . [DISCONTINUED] mirtazapine (REMERON) 7.5 MG tablet Take 1 tablet (7.5 mg total) by mouth at bedtime.  . [DISCONTINUED] nitrofurantoin, macrocrystal-monohydrate, (MACROBID) 100 MG capsule Take 1  capsule (100 mg total) by mouth 2 (two) times daily.   No facility-administered encounter medications on file as of 10/17/2020.    Review of Systems  Constitutional: Negative for appetite change and unexpected weight change.  HENT: Negative for congestion and sinus pain.   Respiratory: Negative for cough, chest tightness and shortness of breath.   Cardiovascular: Negative for chest pain, palpitations and leg swelling.  Gastrointestinal: Negative for abdominal pain, diarrhea, nausea and vomiting.  Genitourinary: Negative for difficulty urinating and dysuria.  Musculoskeletal: Negative for myalgias and neck pain.  Skin: Negative for color change and rash.  Neurological: Negative for dizziness, light-headedness and headaches.  Psychiatric/Behavioral: Negative for agitation and dysphoric mood.       Objective:    Physical Exam Vitals reviewed.  Constitutional:      General: She is not in acute distress.    Appearance: Normal appearance.  HENT:     Head: Normocephalic and atraumatic.     Right Ear: External ear normal.     Left Ear: External ear normal.  Eyes:     General: No scleral icterus.       Right eye: No discharge.        Left eye: No discharge.     Conjunctiva/sclera: Conjunctivae normal.  Neck:     Thyroid: No thyromegaly.  Cardiovascular:     Rate and Rhythm: Normal rate and regular rhythm.  Pulmonary:     Effort: No respiratory distress.     Breath sounds: Normal breath sounds. No  wheezing.  Abdominal:     General: Bowel sounds are normal.     Palpations: Abdomen is soft.     Tenderness: There is no abdominal tenderness.  Musculoskeletal:        General: No swelling or tenderness.     Cervical back: Neck supple. No tenderness.  Lymphadenopathy:     Cervical: No cervical adenopathy.  Skin:    Findings: No erythema or rash.  Neurological:     Mental Status: She is alert.  Psychiatric:        Mood and Affect: Mood normal.        Behavior: Behavior normal.     BP 122/70 (BP Location: Left Arm, Patient Position: Sitting, Cuff Size: Normal)   Pulse (!) 59   Temp 97.9 F (36.6 C) (Oral)   Ht 5\' 4"  (8.413 m)   Wt 103 lb (46.7 kg)   LMP 12/29/1981   SpO2 97%   BMI 17.68 kg/m  Wt Readings from Last 3 Encounters:  10/17/20 103 lb (46.7 kg)  08/22/20 104 lb 3.2 oz (47.3 kg)  04/30/20 108 lb (49 kg)     Lab Results  Component Value Date   WBC 5.3 08/20/2020   HGB 12.2 08/20/2020   HCT 36.8 08/20/2020   PLT 200.0 08/20/2020   GLUCOSE 92 08/20/2020   CHOL 136 08/20/2020   TRIG 63.0 08/20/2020   HDL 52.10 08/20/2020   LDLDIRECT 128.0 05/25/2013   LDLCALC 72 08/20/2020   ALT 15 10/17/2020   AST 21 10/17/2020   NA 142 08/20/2020   K 4.2 08/20/2020   CL 107 08/20/2020   CREATININE 0.64 08/20/2020   BUN 14 08/20/2020   CO2 29 08/20/2020   TSH 2.38 12/07/2019   HGBA1C 5.7 (H) 04/25/2017       Assessment & Plan:   Problem List Items Addressed This Visit    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 ([place])    Followed by Dr Jefm Bryant.  Has been on MTX.  Stopped recently with elevated liver enzyme.  Discussed only slight elevation.  Discussed could continue.  Will follow liver function tests.        Mild depression (HCC)    Increased stress with family issues.  Discussed with her today.  Has started her on remeron last visit.  Did not tolerate.  Is not taking.  Desires no further intervention.  Follow.        Hypothyroidism, unspecified     On thyroid replacement.  Follow tsh.       Relevant Orders   TSH   Hypercholesterolemia    On lipitor.  Low cholesterol diet and exercise.  Discussed importance of remaining on this medication, with history of stroke.  Follow lipid panel and liver function tests.        Relevant Orders   Lipid panel   Hepatic function panel   History of CVA (cerebrovascular accident)    Continue plavix.  Continue lipitor.        Flaccid monoplegia of upper extremity (HCC)    Doing better.  Still some limitations.  Continue exercise.  Follow.       Essential hypertension    Blood pressure as outlined.  On amlodipine.  Follow pressures.  Follow metabolic panel.       Relevant Orders   Basic metabolic panel   Abnormal liver function test - Primary    Recent slight elevation.  Recheck liver panel today.  Ultrasound - fatty liver.  Discussed the importance of continuing her medication.  Follow.       Relevant Orders   Hepatitis C antibody   Hepatic function panel (Completed)    Other Visit Diagnoses    Need for immunization against influenza       Relevant Orders   Flu Vaccine QUAD High Dose(Fluad) (Completed)       Einar Pheasant, MD

## 2020-10-18 LAB — HEPATIC FUNCTION PANEL
ALT: 15 U/L (ref 0–35)
AST: 21 U/L (ref 0–37)
Albumin: 4.3 g/dL (ref 3.5–5.2)
Alkaline Phosphatase: 94 U/L (ref 39–117)
Bilirubin, Direct: 0.1 mg/dL (ref 0.0–0.3)
Total Bilirubin: 0.5 mg/dL (ref 0.2–1.2)
Total Protein: 6.7 g/dL (ref 6.0–8.3)

## 2020-10-19 ENCOUNTER — Encounter: Payer: Self-pay | Admitting: Internal Medicine

## 2020-10-19 NOTE — Assessment & Plan Note (Signed)
Blood pressure as outlined.  On amlodipine.  Follow pressures.  Follow metabolic panel.  

## 2020-10-19 NOTE — Assessment & Plan Note (Signed)
Continue plavix.  Continue lipitor.

## 2020-10-19 NOTE — Assessment & Plan Note (Signed)
Followed by Dr Jefm Bryant.  Has been on MTX.  Stopped recently with elevated liver enzyme.  Discussed only slight elevation.  Discussed could continue.  Will follow liver function tests.

## 2020-10-19 NOTE — Assessment & Plan Note (Signed)
Recent slight elevation.  Recheck liver panel today.  Ultrasound - fatty liver.  Discussed the importance of continuing her medication.  Follow.

## 2020-10-19 NOTE — Assessment & Plan Note (Signed)
Increased stress with family issues.  Discussed with her today.  Has started her on remeron last visit.  Did not tolerate.  Is not taking.  Desires no further intervention.  Follow.

## 2020-10-19 NOTE — Assessment & Plan Note (Signed)
Doing better.  Still some limitations.  Continue exercise.  Follow.

## 2020-10-19 NOTE — Assessment & Plan Note (Signed)
On lipitor.  Low cholesterol diet and exercise.  Discussed importance of remaining on this medication, with history of stroke.  Follow lipid panel and liver function tests.

## 2020-10-19 NOTE — Assessment & Plan Note (Signed)
On thyroid replacement.  Follow tsh.  

## 2020-10-21 LAB — HEPATITIS C ANTIBODY
Hepatitis C Ab: NONREACTIVE
SIGNAL TO CUT-OFF: 0.01 (ref <1.00)

## 2020-11-01 NOTE — Progress Notes (Signed)
closing note due to letter being sent already. 

## 2020-11-07 DIAGNOSIS — Z79899 Other long term (current) drug therapy: Secondary | ICD-10-CM | POA: Diagnosis not present

## 2020-11-07 DIAGNOSIS — M0609 Rheumatoid arthritis without rheumatoid factor, multiple sites: Secondary | ICD-10-CM | POA: Diagnosis not present

## 2020-11-12 DIAGNOSIS — M8949 Other hypertrophic osteoarthropathy, multiple sites: Secondary | ICD-10-CM | POA: Diagnosis not present

## 2020-11-12 DIAGNOSIS — M0609 Rheumatoid arthritis without rheumatoid factor, multiple sites: Secondary | ICD-10-CM | POA: Diagnosis not present

## 2020-11-12 DIAGNOSIS — I6389 Other cerebral infarction: Secondary | ICD-10-CM | POA: Diagnosis not present

## 2020-12-05 DIAGNOSIS — M06072 Rheumatoid arthritis without rheumatoid factor, left ankle and foot: Secondary | ICD-10-CM | POA: Diagnosis not present

## 2020-12-05 DIAGNOSIS — M06071 Rheumatoid arthritis without rheumatoid factor, right ankle and foot: Secondary | ICD-10-CM | POA: Diagnosis not present

## 2020-12-05 DIAGNOSIS — M79671 Pain in right foot: Secondary | ICD-10-CM | POA: Diagnosis not present

## 2020-12-05 DIAGNOSIS — M7751 Other enthesopathy of right foot: Secondary | ICD-10-CM | POA: Diagnosis not present

## 2020-12-12 ENCOUNTER — Other Ambulatory Visit: Payer: Self-pay | Admitting: Internal Medicine

## 2020-12-26 ENCOUNTER — Telehealth: Payer: Self-pay | Admitting: Internal Medicine

## 2020-12-26 NOTE — Telephone Encounter (Signed)
Tried to call but no answer and phone line just rings and rings

## 2020-12-26 NOTE — Telephone Encounter (Signed)
Pt called and said that she has been having cramps in her legs, feet and toes for the last week  She has been taking potassium over the counter and also tried mustard and vinegar and nothing has helped

## 2020-12-26 NOTE — Telephone Encounter (Signed)
If persistent problems, can schedule an appt to discuss.  Ok  to schedule virtual appt next week.

## 2020-12-26 NOTE — Telephone Encounter (Signed)
Pt scheduled 1/7 at 7:30 for telephone visit to discuss. She will continue current regimen for now.

## 2021-01-02 ENCOUNTER — Encounter: Payer: PPO | Admitting: Dermatology

## 2021-01-03 ENCOUNTER — Telehealth: Payer: PPO | Admitting: Internal Medicine

## 2021-01-06 DIAGNOSIS — M7581 Other shoulder lesions, right shoulder: Secondary | ICD-10-CM | POA: Diagnosis not present

## 2021-01-06 DIAGNOSIS — M25511 Pain in right shoulder: Secondary | ICD-10-CM | POA: Diagnosis not present

## 2021-01-22 ENCOUNTER — Other Ambulatory Visit (INDEPENDENT_AMBULATORY_CARE_PROVIDER_SITE_OTHER): Payer: PPO

## 2021-01-22 ENCOUNTER — Other Ambulatory Visit: Payer: Self-pay

## 2021-01-22 DIAGNOSIS — E039 Hypothyroidism, unspecified: Secondary | ICD-10-CM

## 2021-01-22 DIAGNOSIS — E7800 Pure hypercholesterolemia, unspecified: Secondary | ICD-10-CM

## 2021-01-22 DIAGNOSIS — I10 Essential (primary) hypertension: Secondary | ICD-10-CM | POA: Diagnosis not present

## 2021-01-22 LAB — BASIC METABOLIC PANEL
BUN: 13 mg/dL (ref 6–23)
CO2: 29 mEq/L (ref 19–32)
Calcium: 9.6 mg/dL (ref 8.4–10.5)
Chloride: 106 mEq/L (ref 96–112)
Creatinine, Ser: 0.69 mg/dL (ref 0.40–1.20)
GFR: 85.03 mL/min (ref >60.00)
Glucose, Bld: 77 mg/dL (ref 70–99)
Potassium: 4.3 mEq/L (ref 3.5–5.1)
Sodium: 141 mEq/L (ref 135–145)

## 2021-01-22 LAB — HEPATIC FUNCTION PANEL
ALT: 18 U/L (ref 0–35)
AST: 21 U/L (ref 0–37)
Albumin: 4.1 g/dL (ref 3.5–5.2)
Alkaline Phosphatase: 87 U/L (ref 39–117)
Bilirubin, Direct: 0.1 mg/dL (ref 0.0–0.3)
Total Bilirubin: 0.8 mg/dL (ref 0.2–1.2)
Total Protein: 6.7 g/dL (ref 6.0–8.3)

## 2021-01-22 LAB — LIPID PANEL
Cholesterol: 185 mg/dL (ref 0–200)
HDL: 82.2 mg/dL (ref >39.00)
LDL Cholesterol: 89 mg/dL (ref 0–99)
NonHDL: 103.21
Total CHOL/HDL Ratio: 2
Triglycerides: 71 mg/dL (ref 0.0–149.0)
VLDL: 14.2 mg/dL (ref 0.0–40.0)

## 2021-01-22 LAB — TSH: TSH: 3.84 u[IU]/mL (ref 0.35–4.50)

## 2021-01-24 ENCOUNTER — Other Ambulatory Visit: Payer: Self-pay

## 2021-01-24 ENCOUNTER — Encounter: Payer: Self-pay | Admitting: Internal Medicine

## 2021-01-24 ENCOUNTER — Ambulatory Visit (INDEPENDENT_AMBULATORY_CARE_PROVIDER_SITE_OTHER): Payer: PPO | Admitting: Internal Medicine

## 2021-01-24 DIAGNOSIS — E039 Hypothyroidism, unspecified: Secondary | ICD-10-CM | POA: Diagnosis not present

## 2021-01-24 DIAGNOSIS — M0600 Rheumatoid arthritis without rheumatoid factor, unspecified site: Secondary | ICD-10-CM | POA: Diagnosis not present

## 2021-01-24 DIAGNOSIS — R945 Abnormal results of liver function studies: Secondary | ICD-10-CM

## 2021-01-24 DIAGNOSIS — R7989 Other specified abnormal findings of blood chemistry: Secondary | ICD-10-CM

## 2021-01-24 DIAGNOSIS — M7502 Adhesive capsulitis of left shoulder: Secondary | ICD-10-CM | POA: Diagnosis not present

## 2021-01-24 DIAGNOSIS — G8114 Spastic hemiplegia affecting left nondominant side: Secondary | ICD-10-CM

## 2021-01-24 DIAGNOSIS — L609 Nail disorder, unspecified: Secondary | ICD-10-CM | POA: Diagnosis not present

## 2021-01-24 DIAGNOSIS — I10 Essential (primary) hypertension: Secondary | ICD-10-CM

## 2021-01-24 DIAGNOSIS — I639 Cerebral infarction, unspecified: Secondary | ICD-10-CM

## 2021-01-24 DIAGNOSIS — E7800 Pure hypercholesterolemia, unspecified: Secondary | ICD-10-CM | POA: Diagnosis not present

## 2021-01-24 DIAGNOSIS — F320 Major depressive disorder, single episode, mild: Secondary | ICD-10-CM

## 2021-01-24 DIAGNOSIS — F32A Depression, unspecified: Secondary | ICD-10-CM

## 2021-01-24 DIAGNOSIS — K219 Gastro-esophageal reflux disease without esophagitis: Secondary | ICD-10-CM | POA: Diagnosis not present

## 2021-01-24 NOTE — Progress Notes (Signed)
Patient ID: Donna Rhodes, female   DOB: 06/04/1946, 75 y.o.   MRN: [id number]   Subjective:    Patient ID: Donna Rhodes, female    DOB: Apr 22, 1946, 75 y.o.   MRN: [id number]  HPI This visit occurred during the SARS-CoV-2 public health emergency.  Safety protocols were in place, including screening questions prior to the visit, additional usage of staff PPE, and extensive cleaning of exam room while observing appropriate contact time as indicated for disinfecting solutions.  Patient here for scheduled follow up.  Follow up reagarding history of CVA, RA, hypertension and hypercholesterolemia.  Also with increased stress - family/home stress. Discussed at length with her.  Daughter planning to move out of her house next week. Still taking care of her mother - who lives with her.  Does not feel she needs any further intervention.  Tries to stay active.  Able to do her ADLs.  Some limitation by left hand and arm.  Some increased cramps in her hands and legs - intermittent.  Has started taking magnesium.  Improved.  No chest pain or sob.  No acid reflux.  No abdominal pain or bowel change.  Saw Dr Roland Rack 03/2021. S/p injection right shoulder.  Is better.  Thickened nail - fourth finger.  Discussed f/u with dermatology.  Sees Dr Jefm Bryant - f/u RA.  Also saw podiatry for her foot pain.  S/p injection.  Improved.     Past Medical History:  Diagnosis Date  . Atrophic vaginitis   . Dysplasia of vagina    vin 1 - vulva and vaginal cuff  . GERD (gastroesophageal reflux disease)   . HCV infection   . History of basal cell carcinoma (BCC) 04/11/2019    right lat lower eyelid margin  . HSV (herpes simplex virus) infection   . Hypercholesterolemia   . Hypertension   . Hypothyroidism   . Left-sided weakness    S/P CVA 4/18.  PT 2x/wk.  . Lung nodule    stable  . Menopausal state   . Osteoarthritis    hand involvement, cervical disc disease  . Osteoporosis   . Pelvic pain in female   . Rheumatoid  arthritis ([place])   . 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 Thyroid nodule    pt unaware of this   . Vaginal Pap smear, abnormal    lgsil   Past Surgical History:  Procedure Laterality Date  . ADENOIDECTOMY    . APPENDECTOMY  1965  . [place]   left  . CATARACT EXTRACTION W/PHACO Right 10/27/2017   Procedure: CATARACT EXTRACTION PHACO AND INTRAOCULAR LENS PLACEMENT ([place]) RIGHT;  Surgeon: Leandrew Koyanagi, MD;  Location: [facility];  Service: Ophthalmology;  Laterality: Right;  requests early (8-8:30 arrival)  . CATARACT EXTRACTION W/PHACO Left 11/10/2017   Procedure: CATARACT EXTRACTION PHACO AND INTRAOCULAR LENS PLACEMENT ([place]) LEFT;  Surgeon: Leandrew Koyanagi, MD;  Location: [facility];  Service: Ophthalmology;  Laterality: Left;  . CHOLECYSTECTOMY    . [place]  . laser vein surgery    . LOOP RECORDER INSERTION N/A 09/24/2017   Procedure: LOOP RECORDER INSERTION;  Surgeon: Evans Lance, MD;  Location: [facility];  Service: Cardiovascular;  Laterality: N/A;  . MANDIBLE RECONSTRUCTION  1992  . TOENAIL EXCISION  1998   several  . TONSILLECTOMY    . TUBAL LIGATION  1977  . VAGINAL HYSTERECTOMY  1983   als0- anterior/posterior colporrhaphy  . VAGINAL WOUND CLOSURE / REPAIR  1999, 2000  . VAGINAL WOUND CLOSURE / REPAIR     Family History  Problem Relation Age of Onset  . Hypertension Father   . Dementia Father   . Osteoarthritis Mother   . Arthritis/Rheumatoid Sister   . Breast cancer Other        paternal side  . Breast cancer Paternal Aunt   . Breast cancer Cousin        maternal  . Uterine cancer Maternal Grandmother   . Ovarian cancer Neg Hx   . Heart disease Neg Hx   . Diabetes Neg Hx   . Colon cancer Neg Hx    Social History   Socioeconomic History  . Marital status: Divorced    Spouse name: Not on file  . Number of children: 3  . Years of education: 71  . Highest education level: Not on file   Occupational History    Comment: retired  Tobacco Use  . Smoking status: Former Smoker    Quit date: 12/29/1979    Years since quitting: 41.1  . Smokeless tobacco: Never Used  Vaping Use  . Vaping Use: Never used  Substance and Sexual Activity  . Alcohol use: No    Alcohol/week: 0.0 standard drinks  . Drug use: No  . Sexual activity: Not Currently    Birth control/protection: Surgical  Other Topics Concern  . Not on file  Social History Narrative   Her mother and her daughter live with her   Caffeine- 1 cup coffee   Social Determinants of Health   Financial Resource Strain: Not on file  Food Insecurity: Not on file  Transportation Needs: Not on file  Physical Activity: Not on file  Stress: Not on file  Social Connections: Not on file   Outpatient Encounter Medications as of 01/24/2021  Medication Sig  . acetaminophen (TYLENOL) 500 MG tablet Take 1-2 tablets (500-1,000 mg total) by mouth every 6 (six) hours as needed for mild pain.  Marland Kitchen acyclovir (ZOVIRAX) 200 MG capsule Take 1 capsule (200 mg total) by mouth 2 (two) times daily.  Marland Kitchen amLODipine (NORVASC) 2.5 MG tablet TAKE 1 TABLET BY MOUTH DAILY  . atorvastatin (LIPITOR) 20 MG tablet Take 1 tablet (20 mg total) by mouth daily.  . calcium-vitamin D (OSCAL WITH D) 250-125 MG-UNIT tablet Take 1 tablet by mouth daily.  . clopidogrel (PLAVIX) 75 MG tablet Take 1 tablet (75 mg total) by mouth daily.  . clotrimazole (MYCELEX) 10 MG troche One troche - dissolve tid  . levothyroxine (SYNTHROID) 50 MCG tablet TAKE 1 TABLET BY MOUTH DAILY  . pantoprazole (PROTONIX) 20 MG tablet Take 1 tablet (20 mg total) by mouth daily.  . [DISCONTINUED] folic acid (FOLVITE) 1 MG tablet Take 1 mg by mouth daily. (Patient not taking: Reported on 01/24/2021)  . [DISCONTINUED] methotrexate 50 MG/2ML injection Inject 0.25mL under the skin once a weekly on Wednesday (Patient not taking: Reported on 01/24/2021)  . [DISCONTINUED] Multiple Vitamin (MULTIVITAMIN  WITH MINERALS) TABS tablet Take 1 tablet by mouth daily. (Patient not taking: Reported on 01/24/2021)   No facility-administered encounter medications on file as of 01/24/2021.    Review of Systems  Constitutional: Negative for appetite change and unexpected weight change.       Weight stable.  Eating.    HENT: Negative for congestion and sinus pressure.   Respiratory: Negative for cough, chest tightness and shortness of breath.   Cardiovascular: Negative for chest pain, palpitations and leg swelling.  Gastrointestinal: Negative for abdominal  pain, diarrhea, nausea and vomiting.  Genitourinary: Negative for difficulty urinating and dysuria.  Musculoskeletal: Negative for myalgias.       Cramping in hands and legs.   Skin: Negative for color change and rash.  Neurological: Negative for dizziness, light-headedness and headaches.  Psychiatric/Behavioral: Negative for agitation and dysphoric mood.       Increased stress as outlined.         Objective:    Physical Exam Vitals reviewed.  Constitutional:      General: She is not in acute distress.    Appearance: Normal appearance.  HENT:     Head: Normocephalic and atraumatic.     Right Ear: External ear normal.     Left Ear: External ear normal.     Mouth/Throat:     Mouth: Oropharynx is clear and moist.  Eyes:     General: No scleral icterus.       Right eye: No discharge.        Left eye: No discharge.  Neck:     Thyroid: No thyromegaly.  Cardiovascular:     Rate and Rhythm: Normal rate and regular rhythm.  Pulmonary:     Effort: No respiratory distress.     Breath sounds: Normal breath sounds. No wheezing.  Abdominal:     General: Bowel sounds are normal.     Palpations: Abdomen is soft.     Tenderness: There is no abdominal tenderness.  Musculoskeletal:        General: No swelling, tenderness or edema.     Cervical back: Neck supple. No tenderness.  Lymphadenopathy:     Cervical: No cervical adenopathy.  Skin:     Findings: No erythema or rash.  Neurological:     Mental Status: She is alert.  Psychiatric:        Mood and Affect: Mood normal.        Behavior: Behavior normal.     BP 118/70 (BP Location: Left Arm, Patient Position: Sitting)   Pulse 76   Temp 98.3 F (36.8 C)   Ht 5' 4.02" (1.626 m)   Wt 103 lb 6.4 oz (46.9 kg)   LMP 12/29/1981   SpO2 98%   BMI 17.74 kg/m  Wt Readings from Last 3 Encounters:  01/24/21 103 lb 6.4 oz (46.9 kg)  10/17/20 103 lb (46.7 kg)  08/22/20 104 lb 3.2 oz (47.3 kg)     Lab Results  Component Value Date   WBC 5.3 08/20/2020   HGB 12.2 08/20/2020   HCT 36.8 08/20/2020   PLT 200.0 08/20/2020   GLUCOSE 77 01/22/2021   CHOL 185 01/22/2021   TRIG 71.0 01/22/2021   HDL 82.20 01/22/2021   LDLDIRECT 128.0 05/25/2013   LDLCALC 89 01/22/2021   ALT 18 01/22/2021   AST 21 01/22/2021   NA 141 01/22/2021   K 4.3 01/22/2021   CL 106 01/22/2021   CREATININE 0.69 01/22/2021   BUN 13 01/22/2021   CO2 29 01/22/2021   TSH 3.84 01/22/2021   HGBA1C 5.7 (H) 04/25/2017    MM 3D SCREEN BREAST BILATERAL  Result Date: 10/18/2020 CLINICAL DATA:  Screening. EXAM: DIGITAL SCREENING BILATERAL MAMMOGRAM WITH TOMO AND CAD COMPARISON:  Previous exam(s). ACR Breast Density Category c: The breast tissue is heterogeneously dense, which may obscure small masses. FINDINGS: There are no findings suspicious for malignancy. Images were processed with CAD. IMPRESSION: No mammographic evidence of malignancy. A result letter of this screening mammogram will be mailed directly to the patient. RECOMMENDATION:  Screening mammogram in one year. (Code:SM-B-01Y) BI-RADS CATEGORY  1: Negative. Electronically Signed   By: Everlean Alstrom M.D.   On: 10/18/2020 10:10       Assessment & Plan:   Problem List Items Addressed This Visit    Abnormal liver function test    Previous ultrasound fatty liver.  Off MTX.  Recent liver panel wnl.        Adhesive capsulitis of left shoulder    Saw  Dr Roland Rack.  S/p injection.  Doing better.  Follow.       Essential hypertension    On amlodipine.  Follow pressures.  Has been doing well.  No change in medication needed now.  Follow metabolic panel.       GERD (gastroesophageal reflux disease)    On protonix.  No upper symptoms reported.       Hypercholesterolemia    On lipitor.  Low cholesterol diet and exercise.  Follow lipid panel and liver function tests.        Hypothyroidism, unspecified    On thyroid replacement.  Follow tsh.       Mild depression ([place])    Increased stress with family issues as outlined.  Discussed treatment.  Declines medication or therapy.  Follow.       Nail abnormality    Thickened nail - fourth finger.  Plans to f/u with dermatology.        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)    Off MTX.  Followed by rhematology.  Stable.        Spastic hemiparesis of left nondominant side due to acute cerebral infarction (HCC)    On plavix.  Overall stable.  She is able to do her ADLs.  Is limited by the residual affects from the CVA.  Magnesium to help with cramps.  Follow.           Einar Pheasant, MD

## 2021-01-25 ENCOUNTER — Encounter: Payer: Self-pay | Admitting: Internal Medicine

## 2021-01-25 NOTE — Assessment & Plan Note (Signed)
Previous ultrasound fatty liver.  Off MTX.  Recent liver panel wnl.

## 2021-01-25 NOTE — Assessment & Plan Note (Signed)
On plavix.  Overall stable.  She is able to do her ADLs.  Is limited by the residual affects from the CVA.  Magnesium to help with cramps.  Follow.

## 2021-01-25 NOTE — Assessment & Plan Note (Signed)
On protonix.  No upper symptoms reported.   

## 2021-01-25 NOTE — Assessment & Plan Note (Signed)
On amlodipine.  Follow pressures.  Has been doing well.  No change in medication needed now.  Follow metabolic panel.

## 2021-01-25 NOTE — Assessment & Plan Note (Signed)
Saw Dr Roland Rack.  S/p injection.  Doing better.  Follow.

## 2021-01-25 NOTE — Assessment & Plan Note (Signed)
On thyroid replacement.  Follow tsh.  

## 2021-01-25 NOTE — Assessment & Plan Note (Signed)
Thickened nail - fourth finger.  Plans to f/u with dermatology.

## 2021-01-25 NOTE — Assessment & Plan Note (Signed)
On lipitor.  Low cholesterol diet and exercise.  Follow lipid panel and liver function tests.   

## 2021-01-25 NOTE — Assessment & Plan Note (Signed)
Increased stress with family issues as outlined.  Discussed treatment.  Declines medication or therapy.  Follow.

## 2021-01-25 NOTE — Assessment & Plan Note (Signed)
Off MTX.  Followed by rhematology.  Stable.

## 2021-02-24 ENCOUNTER — Ambulatory Visit (INDEPENDENT_AMBULATORY_CARE_PROVIDER_SITE_OTHER): Payer: PPO

## 2021-02-24 VITALS — Ht 64.02 in | Wt 103.0 lb

## 2021-02-24 DIAGNOSIS — Z0000 Encounter for general adult medical examination without abnormal findings: Secondary | ICD-10-CM | POA: Diagnosis not present

## 2021-02-24 NOTE — Progress Notes (Signed)
Subjective:   Donna Rhodes is a 75 y.o. female who presents for Medicare Annual (Subsequent) preventive examination.  Review of Systems    No ROS.  Medicare Wellness Virtual Visit.   Cardiac Risk Factors include: advanced age (>23men, >33 women);hypertension     Objective:    Today's Vitals   02/24/21 1036  Weight: 103 lb (46.7 kg)  Height: 5' 4.02" (1.626 m)   Body mass index is 17.67 kg/m.  Advanced Directives 02/24/2021 02/22/2020 11/10/2017 10/27/2017 10/05/2017 09/07/2017 08/06/2017  Does Patient Have a Medical Advance Directive? Yes Yes Yes Yes No No No  Type of Paramedic of [place];Living will [place];Living will Healthcare Power of [place];Living will - - -  Does patient want to make changes to medical advance directive? No - Patient declined - No - Patient declined - - - -  Copy of [place] in Chart? No - copy requested No - copy requested Yes Yes - - -  Would patient like information on creating a medical advance directive? - - - - - - -    Current Medications (verified) Outpatient Encounter Medications as of 02/24/2021  Medication Sig  . acetaminophen (TYLENOL) 500 MG tablet Take 1-2 tablets (500-1,000 mg total) by mouth every 6 (six) hours as needed for mild pain.  Marland Kitchen acyclovir (ZOVIRAX) 200 MG capsule Take 1 capsule (200 mg total) by mouth 2 (two) times daily.  Marland Kitchen amLODipine (NORVASC) 2.5 MG tablet TAKE 1 TABLET BY MOUTH DAILY  . atorvastatin (LIPITOR) 20 MG tablet Take 1 tablet (20 mg total) by mouth daily.  . calcium-vitamin D (OSCAL WITH D) 250-125 MG-UNIT tablet Take 1 tablet by mouth daily.  . clopidogrel (PLAVIX) 75 MG tablet Take 1 tablet (75 mg total) by mouth daily.  . clotrimazole (MYCELEX) 10 MG troche One troche - dissolve tid  . levothyroxine (SYNTHROID) 50 MCG tablet TAKE 1 TABLET BY MOUTH DAILY  . pantoprazole (PROTONIX) 20 MG tablet Take 1 tablet (20 mg  total) by mouth daily.   No facility-administered encounter medications on file as of 02/24/2021.    Allergies (verified) Amoxicillin, Avelox [moxifloxacin hcl in nacl], Azithromycin, Bacitracin, Codeine, Estroven weight management [nutritional supplements], Fish oil, Food, Moxifloxacin, Naproxen, Nsaids, and Cefepime   History: Past Medical History:  Diagnosis Date  . Atrophic vaginitis   . Dysplasia of vagina    vin 1 - vulva and vaginal cuff  . GERD (gastroesophageal reflux disease)   . HCV infection   . History of basal cell carcinoma (BCC) 04/11/2019    right lat lower eyelid margin  . HSV (herpes simplex virus) infection   . Hypercholesterolemia   . Hypertension   . Hypothyroidism   . Left-sided weakness    S/P CVA 4/18.  PT 2x/wk.  . Lung nodule    stable  . Menopausal state   . Osteoarthritis    hand involvement, cervical disc disease  . Osteoporosis   . Pelvic pain in female   . Rheumatoid arthritis ([place])   . 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 Thyroid nodule    pt unaware of this   . Vaginal Pap smear, abnormal    lgsil   Past Surgical History:  Procedure Laterality Date  . ADENOIDECTOMY    . APPENDECTOMY  1965  . [place]   left  . CATARACT EXTRACTION W/PHACO Right 10/27/2017   Procedure: CATARACT EXTRACTION PHACO AND INTRAOCULAR LENS PLACEMENT ([facility]) RIGHT;  Surgeon: Leandrew Koyanagi, MD;  Location: [facility];  Service: Ophthalmology;  Laterality: Right;  requests early (8-8:30 arrival)  . CATARACT EXTRACTION W/PHACO Left 11/10/2017   Procedure: CATARACT EXTRACTION PHACO AND INTRAOCULAR LENS PLACEMENT ([place]) LEFT;  Surgeon: Leandrew Koyanagi, MD;  Location: [facility];  Service: Ophthalmology;  Laterality: Left;  . CHOLECYSTECTOMY    . [place]  . laser vein surgery    . LOOP RECORDER INSERTION N/A 09/24/2017   Procedure: LOOP RECORDER INSERTION;  Surgeon: Evans Lance, MD;  Location: [facility];   Service: Cardiovascular;  Laterality: N/A;  . MANDIBLE RECONSTRUCTION  1992  . TOENAIL EXCISION  1998   several  . TONSILLECTOMY    . TUBAL LIGATION  1977  . VAGINAL HYSTERECTOMY  1983   als0- anterior/posterior colporrhaphy  . VAGINAL WOUND CLOSURE / REPAIR  1999, 2000  . VAGINAL WOUND CLOSURE / REPAIR     Family History  Problem Relation Age of Onset  . Hypertension Father   . Dementia Father   . Osteoarthritis Mother   . Arthritis/Rheumatoid Sister   . Breast cancer Other        paternal side  . Breast cancer Paternal Aunt   . Breast cancer Cousin        maternal  . Uterine cancer Maternal Grandmother   . Ovarian cancer Neg Hx   . Heart disease Neg Hx   . Diabetes Neg Hx   . Colon cancer Neg Hx    Social History   Socioeconomic History  . Marital status: Divorced    Spouse name: Not on file  . Number of children: 3  . Years of education: 46  . Highest education level: Not on file  Occupational History    Comment: retired  Tobacco Use  . Smoking status: Former Smoker    Quit date: 12/29/1979    Years since quitting: 41.1  . Smokeless tobacco: Never Used  Vaping Use  . Vaping Use: Never used  Substance and Sexual Activity  . Alcohol use: No    Alcohol/week: 0.0 standard drinks  . Drug use: No  . Sexual activity: Not Currently    Birth control/protection: Surgical  Other Topics Concern  . Not on file  Social History Narrative   Her mother and her daughter live with her   Caffeine- 1 cup coffee   Social Determinants of Health   Financial Resource Strain: Low Risk   . Difficulty of Paying Living Expenses: Not hard at all  Food Insecurity: No Food Insecurity  . Worried About Charity fundraiser in the Last Year: Never true  . Ran Out of Food in the Last Year: Never true  Transportation Needs: No Transportation Needs  . Lack of Transportation (Medical): No  . Lack of Transportation (Non-Medical): No  Physical Activity: Not on file  Stress: No Stress  Concern Present  . Feeling of Stress : Only a little  Social Connections: Unknown  . Frequency of Communication with Friends and Family: More than three times a week  . Frequency of Social Gatherings with Friends and Family: More than three times a week  . Attends Religious Services: Not on file  . Active Member of Clubs or Organizations: Not on file  . Attends Archivist Meetings: Not on file  . Marital Status: Not on file    Tobacco Counseling Counseling given: Not Answered   Clinical Intake:  Pre-visit preparation completed: Yes  Diabetes: No  How often do you need to have someone help you when you read instructions, pamphlets, or other written materials from your doctor or pharmacy?: 1 - Never    Interpreter Needed?: No      Activities of Daily Living In your present state of health, do you have any difficulty performing the following activities: 02/24/2021  Hearing? N  Vision? N  Difficulty concentrating or making decisions? N  Walking or climbing stairs? Y  Comment Paces self  Dressing or bathing? N  Doing errands, shopping? N  Preparing Food and eating ? N  Using the Toilet? N  In the past six months, have you accidently leaked urine? Y  Comment Daily pad worn  Do you have problems with loss of bowel control? N  Managing your Medications? N  Managing your Finances? N  Housekeeping or managing your Housekeeping? N  Some recent data might be hidden    Patient Care Team: Einar Pheasant, MD as PCP - General (Internal Medicine)  Indicate any recent Medical Services you may have received from other than [facility] providers in the past year (date may be approximate).     Assessment:   This is a routine wellness examination for Lubertha.  I connected with Kaisey today by telephone and verified that I am speaking with the correct person using two identifiers. Location patient: home Location provider: work Persons participating in the virtual  visit: patient, Marine scientist.    I discussed the limitations, risks, security and privacy concerns of performing an evaluation and management service by telephone and the availability of in person appointments. The patient expressed understanding and verbally consented to this telephonic visit.    Interactive audio and video telecommunications were attempted between this provider and patient, however failed, due to patient having technical difficulties OR patient did not have access to video capability.  We continued and completed visit with audio only.  Some vital signs may be absent or patient reported.   Hearing/Vision screen  Hearing Screening   125Hz  250Hz  500Hz  1000Hz  2000Hz  3000Hz  4000Hz  6000Hz  8000Hz   Right ear:           Left ear:           Comments: Patient is able to hear conversational tones without difficulty. No issues reported.  Vision Screening Comments: Wears corrective lenses  Cataract extraction, bilateral  Visual acuity not assessed, virtual visit. They have seen their ophthalmologist in the last 12 months.   Dietary issues and exercise activities discussed:    Healthy diet Good water intake  Goals      Patient Stated   .  I need to drink Boost as a supplement (pt-stated)    .  I need to drink more water (pt-stated)      Depression Screen PHQ 2/9 Scores 02/24/2021 01/24/2021 04/22/2020 02/22/2020 01/22/2020 12/06/2018 05/03/2018  PHQ - 2 Score - 4 0 0 0 0 0  PHQ- 9 Score - 10 0 - - - -  Exception Documentation Other- indicate reason in comment box - - - - - -  Not completed Patient states no change since last discussed - - - - - -    Fall Risk Fall Risk  02/24/2021 01/24/2021 02/22/2020 01/22/2020 02/07/2019  Falls in the past year? 1 1 0 0 0  Comment - - - - -  Number falls in past yr: - 1 - - -  Comment - - - - -  Injury with Fall? - 0 - - -  Risk Factor Category  - - - - -  Risk for fall due to : - - - - -  Follow up - Falls evaluation completed Falls evaluation  completed - -    FALL RISK PREVENTION PERTAINING TO THE HOME: Handrails in use when climbing stairs?Yes Home free of loose throw rugs in walkways, pet beds, electrical cords, etc? Yes  Adequate lighting in your home to reduce risk of falls? Yes   ASSISTIVE DEVICES UTILIZED TO PREVENT FALLS: Life alert? No  Use of a cane, walker or w/c? No   TIMED UP AND GO: Was the test performed? No . Virtual visit.  Cognitive Function:     6CIT Screen 02/24/2021 02/22/2020  What Year? 0 points 0 points  What month? 0 points 0 points  What time? 0 points 0 points  Count back from 20 0 points 0 points  Months in reverse 0 points 0 points  Repeat phrase - 0 points  Total Score - 0    Immunizations Immunization History  Administered Date(s) Administered  . Fluad Quad(high Dose 65+) 12/08/2019, 10/17/2020  . Influenza Split 10/11/2013, 10/11/2014  . Influenza, High Dose Seasonal PF 10/23/2016, 11/02/2017, 09/27/2018  . Moderna Sars-Covid-2 Vaccination 05/15/2020, 06/12/2020, 12/17/2020  . Pneumococcal Conjugate-13 02/11/2018  . Pneumococcal Polysaccharide-23 07/17/2019    TDAP status: Due, Education has been provided regarding the importance of this vaccine. Advised may receive this vaccine at local pharmacy or Health Dept. Aware to provide a copy of the vaccination record if obtained from local pharmacy or Health Dept. Verbalized acceptance and understanding. Deferred.   Health Maintenance Health Maintenance  Topic Date Due  . TETANUS/TDAP  02/24/2022 (Originally 11/10/1965)  . COVID-19 Vaccine (4 - Booster for Moderna series) 06/17/2021  . COLONOSCOPY (Pts 45-77yrs Insurance coverage will need to be confirmed)  10/24/2023  . INFLUENZA VACCINE  Completed  . DEXA SCAN  Completed  . Hepatitis C Screening  Completed  . MAMMOGRAM  Discontinued  . PNA vac Low Risk Adult  Discontinued   Colorectal cancer screening: Type of screening: Colonoscopy. Completed 10/23/13. Repeat every 10  years  Vision Screening: Recommended annual ophthalmology exams for early detection of glaucoma and other disorders of the eye. Is the patient up to date with their annual eye exam?  Yes   Dental Screening: Recommended annual dental exams for proper oral hygiene.  Community Resource Referral / Chronic Care Management: CRR required this visit?  No   CCM required this visit?  No      Plan:   Keep all routine maintenance appointments.   Follow up 02/28/21 @ 10:00  Follow up 03/18/21 @ 9:30  I have personally reviewed and noted the following in the patient's chart:   . Medical and social history . Use of alcohol, tobacco or illicit drugs  . Current medications and supplements-no opioids in use.  . Functional ability and status . Nutritional status . Physical activity . Advanced directives . List of other physicians . Hospitalizations, surgeries, and ER visits in previous 12 months . Vitals . Screenings to include cognitive, depression, and falls . Referrals and appointments  In addition, I have reviewed and discussed with patient certain preventive protocols, quality metrics, and best practice recommendations. A written personalized care plan for preventive services as well as general preventive health recommendations were provided to patient via mail.     Varney Biles, LPN   09/10/7828

## 2021-02-24 NOTE — Patient Instructions (Addendum)
[name] , Thank you for taking time to come for your Medicare Wellness Visit. I appreciate your ongoing commitment to your health goals. Please review the following plan we discussed and let me know if I can assist you in the future.   These are the goals we discussed: Goals      Patient Stated   .  I need to drink Boost as a supplement (pt-stated)    .  I need to drink more water (pt-stated)       This is a list of the screening recommended for you and due dates:  Health Maintenance  Topic Date Due  . Tetanus Vaccine  02/24/2022*  . COVID-19 Vaccine (4 - Booster for Moderna series) 06/17/2021  . Colon Cancer Screening  10/24/2023  . Flu Shot  Completed  . DEXA scan (bone density measurement)  Completed  .  Hepatitis C: One time screening is recommended by Center for Disease Control  (CDC) for  adults born from [birth year] through 1966.   Completed  . Mammogram  Discontinued  . Pneumonia vaccines  Discontinued  *Topic was postponed. The date shown is not the original due date.    Immunizations Immunization History  Administered Date(s) Administered  . Fluad Quad(high Dose 65+) 12/08/2019, 10/17/2020  . Influenza Split 10/11/2013, 10/11/2014  . Influenza, High Dose Seasonal PF 10/23/2016, 11/02/2017, 09/27/2018  . Moderna Sars-Covid-2 Vaccination 05/15/2020, 06/12/2020, 12/17/2020  . Pneumococcal Conjugate-13 02/11/2018   Keep all routine maintenance appointments.   Follow up 02/28/21 @ 10:00  Follow up 03/18/21 @ 9:30  Advanced directives: End of life planning; Advance aging; Advanced directives discussed.  Copy of current HCPOA/Living Will requested.    Conditions/risks identified: none new.   Follow up in one year for your annual wellness visit.   Preventive Care 30 Years and Older, Female Preventive care refers to lifestyle choices and visits with your health care provider that can promote health and wellness. What does preventive care include?  A yearly physical  exam. This is also called an annual well check.  Dental exams once or twice a year.  Routine eye exams. Ask your health care provider how often you should have your eyes checked.  Personal lifestyle choices, including:  Daily care of your teeth and gums.  Regular physical activity.  Eating a healthy diet.  Avoiding tobacco and drug use.  Limiting alcohol use.  Practicing safe sex.  Taking low-dose aspirin every day.  Taking vitamin and mineral supplements as recommended by your health care provider. What happens during an annual well check? The services and screenings done by your health care provider during your annual well check will depend on your age, overall health, lifestyle risk factors, and family history of disease. Counseling  Your health care provider may ask you questions about your:  Alcohol use.  Tobacco use.  Drug use.  Emotional well-being.  Home and relationship well-being.  Sexual activity.  Eating habits.  History of falls.  Memory and ability to understand (cognition).  Work and work Statistician.  Reproductive health. Screening  You may have the following tests or measurements:  Height, weight, and BMI.  Blood pressure.  Lipid and cholesterol levels. These may be checked every 5 years, or more frequently if you are over 60 years old.  Skin check.  Lung cancer screening. You may have this screening every year starting at age 82 if you have a 30-pack-year history of smoking and currently smoke or have quit within the past 15  years.  Fecal occult blood test (FOBT) of the stool. You may have this test every year starting at age 61.  Flexible sigmoidoscopy or colonoscopy. You may have a sigmoidoscopy every 5 years or a colonoscopy every 10 years starting at age 36.  Hepatitis C blood test.  Hepatitis B blood test.  Sexually transmitted disease (STD) testing.  Diabetes screening. This is done by checking your blood sugar (glucose)  after you have not eaten for a while (fasting). You may have this done every 1-3 years.  Bone density scan. This is done to screen for osteoporosis. You may have this done starting at age 38.  Mammogram. This may be done every 1-2 years. Talk to your health care provider about how often you should have regular mammograms. Talk with your health care provider about your test results, treatment options, and if necessary, the need for more tests. Vaccines  Your health care provider may recommend certain vaccines, such as:  Influenza vaccine. This is recommended every year.  Tetanus, diphtheria, and acellular pertussis (Tdap, Td) vaccine. You may need a Td booster every 10 years.  Zoster vaccine. You may need this after age 77.  Pneumococcal 13-valent conjugate (PCV13) vaccine. One dose is recommended after age 92.  Pneumococcal polysaccharide (PPSV23) vaccine. One dose is recommended after age 16. Talk to your health care provider about which screenings and vaccines you need and how often you need them. This information is not intended to replace advice given to you by your health care provider. Make sure you discuss any questions you have with your health care provider. Document Released: 01/10/2016 Document Revised: 09/02/2016 Document Reviewed: 10/15/2015 Elsevier Interactive Patient Education  2017 [place] Prevention in the Home Falls can cause injuries. They can happen to people of all ages. There are many things you can do to make your home safe and to help prevent falls. What can I do on the outside of my home?  Regularly fix the edges of walkways and driveways and fix any cracks.  Remove anything that might make you trip as you walk through a door, such as a raised step or threshold.  Trim any bushes or trees on the path to your home.  Use bright outdoor lighting.  Clear any walking paths of anything that might make someone trip, such as rocks or  tools.  Regularly check to see if handrails are loose or broken. Make sure that both sides of any steps have handrails.  Any raised decks and porches should have guardrails on the edges.  Have any leaves, snow, or ice cleared regularly.  Use sand or salt on walking paths during winter.  Clean up any spills in your garage right away. This includes oil or grease spills. What can I do in the bathroom?  Use night lights.  Install grab bars by the toilet and in the tub and shower. Do not use towel bars as grab bars.  Use non-skid mats or decals in the tub or shower.  If you need to sit down in the shower, use a plastic, non-slip stool.  Keep the floor dry. Clean up any water that spills on the floor as soon as it happens.  Remove soap buildup in the tub or shower regularly.  Attach bath mats securely with double-sided non-slip rug tape.  Do not have throw rugs and other things on the floor that can make you trip. What can I do in the bedroom?  Use night lights.  Make  sure that you have a light by your bed that is easy to reach.  Do not use any sheets or blankets that are too big for your bed. They should not hang down onto the floor.  Have a firm chair that has side arms. You can use this for support while you get dressed.  Do not have throw rugs and other things on the floor that can make you trip. What can I do in the kitchen?  Clean up any spills right away.  Avoid walking on wet floors.  Keep items that you use a lot in easy-to-reach places.  If you need to reach something above you, use a strong step stool that has a grab bar.  Keep electrical cords out of the way.  Do not use floor polish or wax that makes floors slippery. If you must use wax, use non-skid floor wax.  Do not have throw rugs and other things on the floor that can make you trip. What can I do with my stairs?  Do not leave any items on the stairs.  Make sure that there are handrails on both  sides of the stairs and use them. Fix handrails that are broken or loose. Make sure that handrails are as long as the stairways.  Check any carpeting to make sure that it is firmly attached to the stairs. Fix any carpet that is loose or worn.  Avoid having throw rugs at the top or bottom of the stairs. If you do have throw rugs, attach them to the floor with carpet tape.  Make sure that you have a light switch at the top of the stairs and the bottom of the stairs. If you do not have them, ask someone to add them for you. What else can I do to help prevent falls?  Wear shoes that:  Do not have high heels.  Have rubber bottoms.  Are comfortable and fit you well.  Are closed at the toe. Do not wear sandals.  If you use a stepladder:  Make sure that it is fully opened. Do not climb a closed stepladder.  Make sure that both sides of the stepladder are locked into place.  Ask someone to hold it for you, if possible.  Clearly mark and make sure that you can see:  Any grab bars or handrails.  First and last steps.  Where the edge of each step is.  Use tools that help you move around (mobility aids) if they are needed. These include:  Canes.  Walkers.  Scooters.  Crutches.  Turn on the lights when you go into a dark area. Replace any light bulbs as soon as they burn out.  Set up your furniture so you have a clear path. Avoid moving your furniture around.  If any of your floors are uneven, fix them.  If there are any pets around you, be aware of where they are.  Review your medicines with your doctor. Some medicines can make you feel dizzy. This can increase your chance of falling. Ask your doctor what other things that you can do to help prevent falls. This information is not intended to replace advice given to you by your health care provider. Make sure you discuss any questions you have with your health care provider. Document Released: 10/10/2009 Document Revised:  05/21/2016 Document Reviewed: 01/18/2015 Elsevier Interactive Patient Education  2017 [employer].

## 2021-02-28 ENCOUNTER — Ambulatory Visit (INDEPENDENT_AMBULATORY_CARE_PROVIDER_SITE_OTHER): Payer: PPO | Admitting: Internal Medicine

## 2021-02-28 ENCOUNTER — Encounter: Payer: Self-pay | Admitting: Internal Medicine

## 2021-02-28 ENCOUNTER — Other Ambulatory Visit: Payer: Self-pay

## 2021-02-28 DIAGNOSIS — E039 Hypothyroidism, unspecified: Secondary | ICD-10-CM | POA: Diagnosis not present

## 2021-02-28 DIAGNOSIS — E7800 Pure hypercholesterolemia, unspecified: Secondary | ICD-10-CM | POA: Diagnosis not present

## 2021-02-28 DIAGNOSIS — R0602 Shortness of breath: Secondary | ICD-10-CM

## 2021-02-28 DIAGNOSIS — K219 Gastro-esophageal reflux disease without esophagitis: Secondary | ICD-10-CM

## 2021-02-28 DIAGNOSIS — F320 Major depressive disorder, single episode, mild: Secondary | ICD-10-CM | POA: Diagnosis not present

## 2021-02-28 DIAGNOSIS — Z8673 Personal history of transient ischemic attack (TIA), and cerebral infarction without residual deficits: Secondary | ICD-10-CM

## 2021-02-28 DIAGNOSIS — R945 Abnormal results of liver function studies: Secondary | ICD-10-CM

## 2021-02-28 DIAGNOSIS — I639 Cerebral infarction, unspecified: Secondary | ICD-10-CM

## 2021-02-28 DIAGNOSIS — R7989 Other specified abnormal findings of blood chemistry: Secondary | ICD-10-CM

## 2021-02-28 DIAGNOSIS — G8114 Spastic hemiplegia affecting left nondominant side: Secondary | ICD-10-CM | POA: Diagnosis not present

## 2021-02-28 DIAGNOSIS — I10 Essential (primary) hypertension: Secondary | ICD-10-CM | POA: Diagnosis not present

## 2021-02-28 DIAGNOSIS — M0600 Rheumatoid arthritis without rheumatoid factor, unspecified site: Secondary | ICD-10-CM | POA: Diagnosis not present

## 2021-02-28 DIAGNOSIS — F32A Depression, unspecified: Secondary | ICD-10-CM

## 2021-02-28 NOTE — Progress Notes (Signed)
Patient ID: Donna Rhodes, female   DOB: 1946/07/21, 75 y.o.   MRN: [id number]   Subjective:    Patient ID: Donna Rhodes, female    DOB: [birth year], 75 y.o.   MRN: [id number]  HPI This visit occurred during the SARS-CoV-2 public health emergency.  Safety protocols were in place, including screening questions prior to the visit, additional usage of staff PPE, and extensive cleaning of exam room while observing appropriate contact time as indicated for disinfecting solutions.  Patient here for work in appt. Work in to discussed cxr.  On questioning her, she reports since her stroke, she has noticed being more sob.  Tries to stay active.  No chest pain.  Will notice some sob with exertion at times, but mainly notices with talking.  States this started after her stroke.  She quit smoking in 1981.  Previously smoked for 18-20 years - 1 ppd.  Some acid reflux at times.  Eating.  No regurgitation of food.  No increased cough or congestion.  No abdominal pain.  Bowels moving.  Sore spot - shoulder blade - notices when pressure applied.  Increased stress.  Discussed.  Desires no further intervention.    Past Medical History:  Diagnosis Date  . Atrophic vaginitis   . Dysplasia of vagina    vin 1 - vulva and vaginal cuff  . GERD (gastroesophageal reflux disease)   . HCV infection   . History of basal cell carcinoma (BCC) 04/11/2019    right lat lower eyelid margin  . HSV (herpes simplex virus) infection   . Hypercholesterolemia   . Hypertension   . Hypothyroidism   . Left-sided weakness    S/P CVA 4/18.  PT 2x/wk.  . Lung nodule    stable  . Menopausal state   . Osteoarthritis    hand involvement, cervical disc disease  . Osteoporosis   . Pelvic pain in female   . Rheumatoid arthritis ([place])   . 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 Thyroid nodule    pt unaware of this   . Vaginal Pap smear, abnormal    lgsil   Past Surgical History:  Procedure Laterality Date  . ADENOIDECTOMY     . APPENDECTOMY  1965  . [place]   left  . CATARACT EXTRACTION W/PHACO Right 10/27/2017   Procedure: CATARACT EXTRACTION PHACO AND INTRAOCULAR LENS PLACEMENT ([place]) RIGHT;  Surgeon: Leandrew Koyanagi, MD;  Location: [facility];  Service: Ophthalmology;  Laterality: Right;  requests early (8-8:30 arrival)  . CATARACT EXTRACTION W/PHACO Left 11/10/2017   Procedure: CATARACT EXTRACTION PHACO AND INTRAOCULAR LENS PLACEMENT ([place]) LEFT;  Surgeon: Leandrew Koyanagi, MD;  Location: [facility];  Service: Ophthalmology;  Laterality: Left;  . CHOLECYSTECTOMY    . [place]  . laser vein surgery    . LOOP RECORDER INSERTION N/A 09/24/2017   Procedure: LOOP RECORDER INSERTION;  Surgeon: Evans Lance, MD;  Location: [facility];  Service: Cardiovascular;  Laterality: N/A;  . MANDIBLE RECONSTRUCTION  1992  . TOENAIL EXCISION  1998   several  . TONSILLECTOMY    . TUBAL LIGATION  1977  . VAGINAL HYSTERECTOMY  1983   als0- anterior/posterior colporrhaphy  . VAGINAL WOUND CLOSURE / REPAIR  1999, 2000  . VAGINAL WOUND CLOSURE / REPAIR     Family History  Problem Relation Age of Onset  . Hypertension Father   . Dementia Father   . Osteoarthritis Mother   . Arthritis/Rheumatoid Sister   .  Breast cancer Other        paternal side  . Breast cancer Paternal Aunt   . Breast cancer Cousin        maternal  . Uterine cancer Maternal Grandmother   . Ovarian cancer Neg Hx   . Heart disease Neg Hx   . Diabetes Neg Hx   . Colon cancer Neg Hx    Social History   Socioeconomic History  . Marital status: Divorced    Spouse name: Not on file  . Number of children: 3  . Years of education: 40  . Highest education level: Not on file  Occupational History    Comment: retired  Tobacco Use  . Smoking status: Former Smoker    Quit date: 12/29/1979    Years since quitting: 41.2  . Smokeless tobacco: Never Used  Vaping Use  . Vaping Use: Never used   Substance and Sexual Activity  . Alcohol use: No    Alcohol/week: 0.0 standard drinks  . Drug use: No  . Sexual activity: Not Currently    Birth control/protection: Surgical  Other Topics Concern  . Not on file  Social History Narrative   Her mother and her daughter live with her   Caffeine- 1 cup coffee   Social Determinants of Health   Financial Resource Strain: Low Risk   . Difficulty of Paying Living Expenses: Not hard at all  Food Insecurity: No Food Insecurity  . Worried About Charity fundraiser in the Last Year: Never true  . Ran Out of Food in the Last Year: Never true  Transportation Needs: No Transportation Needs  . Lack of Transportation (Medical): No  . Lack of Transportation (Non-Medical): No  Physical Activity: Not on file  Stress: No Stress Concern Present  . Feeling of Stress : Only a little  Social Connections: Unknown  . Frequency of Communication with Friends and Family: More than three times a week  . Frequency of Social Gatherings with Friends and Family: More than three times a week  . Attends Religious Services: Not on file  . Active Member of Clubs or Organizations: Not on file  . Attends Archivist Meetings: Not on file  . Marital Status: Not on file    Outpatient Encounter Medications as of 02/28/2021  Medication Sig  . acetaminophen (TYLENOL) 500 MG tablet Take 1-2 tablets (500-1,000 mg total) by mouth every 6 (six) hours as needed for mild pain.  Marland Kitchen acyclovir (ZOVIRAX) 200 MG capsule Take 1 capsule (200 mg total) by mouth 2 (two) times daily.  Marland Kitchen amLODipine (NORVASC) 2.5 MG tablet TAKE 1 TABLET BY MOUTH DAILY  . atorvastatin (LIPITOR) 20 MG tablet Take 1 tablet (20 mg total) by mouth daily.  . calcium-vitamin D (OSCAL WITH D) 250-125 MG-UNIT tablet Take 1 tablet by mouth daily.  . clopidogrel (PLAVIX) 75 MG tablet Take 1 tablet (75 mg total) by mouth daily.  . clotrimazole (MYCELEX) 10 MG troche One troche - dissolve tid  . levothyroxine  (SYNTHROID) 50 MCG tablet TAKE 1 TABLET BY MOUTH DAILY  . pantoprazole (PROTONIX) 20 MG tablet Take 1 tablet (20 mg total) by mouth daily.   No facility-administered encounter medications on file as of 02/28/2021.    Review of Systems  Constitutional: Negative for appetite change and unexpected weight change.  HENT: Negative for congestion and sinus pressure.   Respiratory: Negative for cough and chest tightness.        SOB as outlined.  Cardiovascular: Negative for chest pain, palpitations and leg swelling.  Gastrointestinal: Negative for abdominal pain, diarrhea, nausea and vomiting.  Genitourinary: Negative for difficulty urinating and dysuria.  Musculoskeletal: Negative for joint swelling and myalgias.  Skin: Negative for color change and rash.  Neurological: Negative for dizziness, light-headedness and headaches.  Psychiatric/Behavioral: Negative for agitation and dysphoric mood.       Objective:    Physical Exam Vitals reviewed.  Constitutional:      General: She is not in acute distress.    Appearance: Normal appearance.  HENT:     Head: Atraumatic.     Right Ear: External ear normal.     Left Ear: External ear normal.     Mouth/Throat:     Mouth: Oropharynx is clear and moist.  Eyes:     General: No scleral icterus.       Right eye: No discharge.        Left eye: No discharge.     Conjunctiva/sclera: Conjunctivae normal.  Neck:     Thyroid: No thyromegaly.  Cardiovascular:     Rate and Rhythm: Normal rate and regular rhythm.  Pulmonary:     Effort: No respiratory distress.     Breath sounds: Normal breath sounds. No wheezing.  Abdominal:     General: Bowel sounds are normal.     Palpations: Abdomen is soft.     Tenderness: There is no abdominal tenderness.  Musculoskeletal:        General: No swelling, tenderness or edema.     Cervical back: Neck supple. No tenderness.  Lymphadenopathy:     Cervical: No cervical adenopathy.  Skin:    Findings: No  erythema or rash.  Neurological:     Mental Status: She is alert.  Psychiatric:        Mood and Affect: Mood normal.        Behavior: Behavior normal.     BP 128/68   Pulse 74   Temp (!) 97.4 F (36.3 C) (Oral)   Resp 16   Ht 5\' 4"  (1.626 m)   Wt 106 lb 6.4 oz (48.3 kg)   LMP 12/29/1981   SpO2 99%   BMI 18.26 kg/m  Wt Readings from Last 3 Encounters:  02/28/21 106 lb 6.4 oz (48.3 kg)  02/24/21 103 lb (46.7 kg)  01/24/21 103 lb 6.4 oz (46.9 kg)     Lab Results  Component Value Date   WBC 5.3 08/20/2020   HGB 12.2 08/20/2020   HCT 36.8 08/20/2020   PLT 200.0 08/20/2020   GLUCOSE 77 01/22/2021   CHOL 185 01/22/2021   TRIG 71.0 01/22/2021   HDL 82.20 01/22/2021   LDLDIRECT 128.0 05/25/2013   LDLCALC 89 01/22/2021   ALT 18 01/22/2021   AST 21 01/22/2021   NA 141 01/22/2021   K 4.3 01/22/2021   CL 106 01/22/2021   CREATININE 0.69 01/22/2021   BUN 13 01/22/2021   CO2 29 01/22/2021   TSH 3.84 01/22/2021   HGBA1C 5.7 (H) 04/25/2017    MM 3D SCREEN BREAST BILATERAL  Result Date: 10/18/2020 CLINICAL DATA:  Screening. EXAM: DIGITAL SCREENING BILATERAL MAMMOGRAM WITH TOMO AND CAD COMPARISON:  Previous exam(s). ACR Breast Density Category c: The breast tissue is heterogeneously dense, which may obscure small masses. FINDINGS: There are no findings suspicious for malignancy. Images were processed with CAD. IMPRESSION: No mammographic evidence of malignancy. A result letter of this screening mammogram will be mailed directly to the patient. RECOMMENDATION: Screening mammogram in  one year. (Code:SM-B-01Y) BI-RADS CATEGORY  1: Negative. Electronically Signed   By: Everlean Alstrom M.D.   On: 10/18/2020 10:10       Assessment & Plan:   Problem List Items Addressed This Visit    Abnormal liver function test    Previous ultrasound revealed fatty liver.  Follow liver function tests.        Essential hypertension    On amlodipine.  Blood pressure doing well. Follow  pressures.  Follow metabolic panel.       GERD (gastroesophageal reflux disease)    Some acid reflux as outlined. On protonix.  Avoid eating late and avoid foods that aggravate.  Discussed pepcid.  Follow.       History of CVA (cerebrovascular accident)    Continue plavix and lipitor.  Discussed PT. She does not feel needs.  Follow.       Hypercholesterolemia    On lipitor.  Low cholesterol diet and exercise.  Follow lipid panel and liver function tests.        Hypothyroidism, unspecified    On thyroid replacement.  Follow tsh.       Mild depression ([place])    Discussed with her today.  No suicidal ideations.  Discussed therapy and medication. She declines.  Does not feel needs any further intervention at this time.  Follow.        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 ([place])    Followed by rheumatology.  Stable.       Shortness of breath    Discussed.  She tries to stay active.  Does not feel limits her activity.  Discussed notices when talking a lot.  Recent cxr clear.  Discussed further w/up including EKG and f/u cxr, etc.  She declines.  Wants to monitor.  Will notify me if changes her mind.        Spastic hemiparesis of left nondominant side due to acute cerebral infarction [facility])    She is able to do her ADLs.  Continue plavix.            Einar Pheasant, MD

## 2021-03-02 ENCOUNTER — Encounter: Payer: Self-pay | Admitting: Internal Medicine

## 2021-03-02 NOTE — Assessment & Plan Note (Signed)
On amlodipine.  Blood pressure doing well.  Follow pressures.  Follow metabolic panel.  

## 2021-03-02 NOTE — Assessment & Plan Note (Signed)
She is able to do her ADLs.  Continue plavix.

## 2021-03-02 NOTE — Assessment & Plan Note (Signed)
Discussed.  She tries to stay active.  Does not feel limits her activity.  Discussed notices when talking a lot.  Recent cxr clear.  Discussed further w/up including EKG and f/u cxr, etc.  She declines.  Wants to monitor.  Will notify me if changes her mind.

## 2021-03-02 NOTE — Assessment & Plan Note (Signed)
Previous ultrasound revealed fatty liver.  Follow liver function tests.   

## 2021-03-02 NOTE — Assessment & Plan Note (Signed)
On thyroid replacement.  Follow tsh.  

## 2021-03-02 NOTE — Assessment & Plan Note (Signed)
Discussed with her today.  No suicidal ideations.  Discussed therapy and medication. She declines.  Does not feel needs any further intervention at this time.  Follow.

## 2021-03-02 NOTE — Assessment & Plan Note (Signed)
Followed by rheumatology.  Stable.  

## 2021-03-02 NOTE — Assessment & Plan Note (Signed)
Some acid reflux as outlined. On protonix.  Avoid eating late and avoid foods that aggravate.  Discussed pepcid.  Follow.

## 2021-03-02 NOTE — Assessment & Plan Note (Signed)
Continue plavix and lipitor.  Discussed PT. She does not feel needs.  Follow.

## 2021-03-02 NOTE — Assessment & Plan Note (Signed)
On lipitor.  Low cholesterol diet and exercise.  Follow lipid panel and liver function tests.   

## 2021-03-06 ENCOUNTER — Other Ambulatory Visit: Payer: Self-pay | Admitting: Internal Medicine

## 2021-03-18 ENCOUNTER — Ambulatory Visit: Payer: PPO | Admitting: Internal Medicine

## 2021-03-27 DIAGNOSIS — M06071 Rheumatoid arthritis without rheumatoid factor, right ankle and foot: Secondary | ICD-10-CM | POA: Diagnosis not present

## 2021-03-27 DIAGNOSIS — M7751 Other enthesopathy of right foot: Secondary | ICD-10-CM | POA: Diagnosis not present

## 2021-03-27 DIAGNOSIS — M06072 Rheumatoid arthritis without rheumatoid factor, left ankle and foot: Secondary | ICD-10-CM | POA: Diagnosis not present

## 2021-03-27 DIAGNOSIS — M79671 Pain in right foot: Secondary | ICD-10-CM | POA: Diagnosis not present

## 2021-04-10 ENCOUNTER — Other Ambulatory Visit: Payer: Self-pay

## 2021-04-10 ENCOUNTER — Ambulatory Visit (INDEPENDENT_AMBULATORY_CARE_PROVIDER_SITE_OTHER): Payer: PPO | Admitting: Internal Medicine

## 2021-04-10 ENCOUNTER — Encounter: Payer: Self-pay | Admitting: Internal Medicine

## 2021-04-10 VITALS — BP 110/62 | HR 83 | Temp 97.0°F | Resp 16 | Ht 64.0 in | Wt 108.0 lb

## 2021-04-10 DIAGNOSIS — G8114 Spastic hemiplegia affecting left nondominant side: Secondary | ICD-10-CM

## 2021-04-10 DIAGNOSIS — Z8673 Personal history of transient ischemic attack (TIA), and cerebral infarction without residual deficits: Secondary | ICD-10-CM | POA: Diagnosis not present

## 2021-04-10 DIAGNOSIS — I639 Cerebral infarction, unspecified: Secondary | ICD-10-CM | POA: Diagnosis not present

## 2021-04-10 DIAGNOSIS — E7800 Pure hypercholesterolemia, unspecified: Secondary | ICD-10-CM | POA: Diagnosis not present

## 2021-04-10 DIAGNOSIS — M0600 Rheumatoid arthritis without rheumatoid factor, unspecified site: Secondary | ICD-10-CM | POA: Diagnosis not present

## 2021-04-10 DIAGNOSIS — F320 Major depressive disorder, single episode, mild: Secondary | ICD-10-CM

## 2021-04-10 DIAGNOSIS — F32A Depression, unspecified: Secondary | ICD-10-CM

## 2021-04-10 DIAGNOSIS — R42 Dizziness and giddiness: Secondary | ICD-10-CM

## 2021-04-10 DIAGNOSIS — K219 Gastro-esophageal reflux disease without esophagitis: Secondary | ICD-10-CM | POA: Diagnosis not present

## 2021-04-10 DIAGNOSIS — I10 Essential (primary) hypertension: Secondary | ICD-10-CM

## 2021-04-10 DIAGNOSIS — R1900 Intra-abdominal and pelvic swelling, mass and lump, unspecified site: Secondary | ICD-10-CM | POA: Diagnosis not present

## 2021-04-10 DIAGNOSIS — E039 Hypothyroidism, unspecified: Secondary | ICD-10-CM

## 2021-04-10 DIAGNOSIS — D649 Anemia, unspecified: Secondary | ICD-10-CM | POA: Diagnosis not present

## 2021-04-10 LAB — URINALYSIS, ROUTINE W REFLEX MICROSCOPIC
Bilirubin Urine: NEGATIVE
Hgb urine dipstick: NEGATIVE
Leukocytes,Ua: NEGATIVE
Nitrite: NEGATIVE
Specific Gravity, Urine: 1.025 (ref 1.000–1.030)
Total Protein, Urine: NEGATIVE
Urine Glucose: NEGATIVE
Urobilinogen, UA: 0.2 (ref 0.0–1.0)
pH: 6 (ref 5.0–8.0)

## 2021-04-10 NOTE — Progress Notes (Signed)
Patient ID: Donna Rhodes, female   DOB: July 23, 1946, 75 y.o.   MRN: [id number]   Subjective:    Patient ID: Donna Rhodes, female    DOB: [birth year], 75 y.o.   MRN: [id number]  HPI This visit occurred during the SARS-CoV-2 public health emergency.  Safety protocols were in place, including screening questions prior to the visit, additional usage of staff PPE, and extensive cleaning of exam room while observing appropriate contact time as indicated for disinfecting solutions.  Patient here for a scheduled follow up.  History of CVA.  Continues on plavix and lipitor.  She tries to stay active.  No chest pain.  Breathing stable.  She does report that she is having balance issues.  Also describes feeling swimmy headed.  No headache.  She is concerned given her history of CVA.  No fall.  No acid reflux reported.   Bowels moving.  Increased stress - family stress.  Mother living with her.  She is getting ready to move in with her sister.     Past Medical History:  Diagnosis Date  . Atrophic vaginitis   . Dysplasia of vagina    vin 1 - vulva and vaginal cuff  . GERD (gastroesophageal reflux disease)   . HCV infection   . History of basal cell carcinoma (BCC) 04/11/2019    right lat lower eyelid margin  . HSV (herpes simplex virus) infection   . Hypercholesterolemia   . Hypertension   . Hypothyroidism   . Left-sided weakness    S/P CVA 4/18.  PT 2x/wk.  . Lung nodule    stable  . Menopausal state   . Osteoarthritis    hand involvement, cervical disc disease  . Osteoporosis   . Pelvic pain in female   . Rheumatoid arthritis ([place])   . Stroke (cerebrum) ([place]) 04/24/2017  . Thyroid nodule    pt unaware of this   . Vaginal Pap smear, abnormal    lgsil   Past Surgical History:  Procedure Laterality Date  . ADENOIDECTOMY    . APPENDECTOMY  1965  . [place]   left  . CATARACT EXTRACTION W/PHACO Right 10/27/2017   Procedure: CATARACT EXTRACTION PHACO AND INTRAOCULAR LENS  PLACEMENT ([place]) RIGHT;  Surgeon: Leandrew Koyanagi, MD;  Location: [facility];  Service: Ophthalmology;  Laterality: Right;  requests early (8-8:30 arrival)  . CATARACT EXTRACTION W/PHACO Left 11/10/2017   Procedure: CATARACT EXTRACTION PHACO AND INTRAOCULAR LENS PLACEMENT ([place]) LEFT;  Surgeon: Leandrew Koyanagi, MD;  Location: [facility];  Service: Ophthalmology;  Laterality: Left;  . CHOLECYSTECTOMY    . [place]  . laser vein surgery    . LOOP RECORDER INSERTION N/A 09/24/2017   Procedure: LOOP RECORDER INSERTION;  Surgeon: Evans Lance, MD;  Location: [facility];  Service: Cardiovascular;  Laterality: N/A;  . MANDIBLE RECONSTRUCTION  1992  . TOENAIL EXCISION  1998   several  . TONSILLECTOMY    . TUBAL LIGATION  1977  . VAGINAL HYSTERECTOMY  1983   als0- anterior/posterior colporrhaphy  . VAGINAL WOUND CLOSURE / REPAIR  1999, 2000  . VAGINAL WOUND CLOSURE / REPAIR     Family History  Problem Relation Age of Onset  . Hypertension Father   . Dementia Father   . Osteoarthritis Mother   . Arthritis/Rheumatoid Sister   . Breast cancer Other        paternal side  . Breast cancer Paternal Aunt   . Breast  cancer Cousin        maternal  . Uterine cancer Maternal Grandmother   . Ovarian cancer Neg Hx   . Heart disease Neg Hx   . Diabetes Neg Hx   . Colon cancer Neg Hx    Social History   Socioeconomic History  . Marital status: Divorced    Spouse name: Not on file  . Number of children: 3  . Years of education: 48  . Highest education level: Not on file  Occupational History    Comment: retired  Tobacco Use  . Smoking status: Former Smoker    Quit date: 12/29/1979    Years since quitting: 41.3  . Smokeless tobacco: Never Used  Vaping Use  . Vaping Use: Never used  Substance and Sexual Activity  . Alcohol use: No    Alcohol/week: 0.0 standard drinks  . Drug use: No  . Sexual activity: Not Currently    Birth  control/protection: Surgical  Other Topics Concern  . Not on file  Social History Narrative   Her mother and her daughter live with her   Caffeine- 1 cup coffee   Social Determinants of Health   Financial Resource Strain: Low Risk   . Difficulty of Paying Living Expenses: Not hard at all  Food Insecurity: No Food Insecurity  . Worried About Charity fundraiser in the Last Year: Never true  . Ran Out of Food in the Last Year: Never true  Transportation Needs: No Transportation Needs  . Lack of Transportation (Medical): No  . Lack of Transportation (Non-Medical): No  Physical Activity: Not on file  Stress: No Stress Concern Present  . Feeling of Stress : Only a little  Social Connections: Unknown  . Frequency of Communication with Friends and Family: More than three times a week  . Frequency of Social Gatherings with Friends and Family: More than three times a week  . Attends Religious Services: Not on file  . Active Member of Clubs or Organizations: Not on file  . Attends Archivist Meetings: Not on file  . Marital Status: Not on file    Outpatient Encounter Medications as of 04/10/2021  Medication Sig  . acetaminophen (TYLENOL) 500 MG tablet Take 1-2 tablets (500-1,000 mg total) by mouth every 6 (six) hours as needed for mild pain.  Marland Kitchen acyclovir (ZOVIRAX) 200 MG capsule Take 1 capsule (200 mg total) by mouth 2 (two) times daily.  Marland Kitchen amLODipine (NORVASC) 2.5 MG tablet TAKE 1 TABLET BY MOUTH DAILY  . atorvastatin (LIPITOR) 20 MG tablet TAKE 1 TABLET BY MOUTH DAILY  . calcium-vitamin D (OSCAL WITH D) 250-125 MG-UNIT tablet Take 1 tablet by mouth daily.  . clopidogrel (PLAVIX) 75 MG tablet TAKE 1 TABLET BY MOUTH DAILY  . levothyroxine (SYNTHROID) 50 MCG tablet TAKE 1 TABLET BY MOUTH DAILY  . pantoprazole (PROTONIX) 20 MG tablet Take 1 tablet (20 mg total) by mouth daily.  . [DISCONTINUED] clotrimazole (MYCELEX) 10 MG troche One troche - dissolve tid (Patient not taking:  Reported on 04/10/2021)   No facility-administered encounter medications on file as of 04/10/2021.    Review of Systems  Constitutional: Negative for appetite change and unexpected weight change.  HENT: Negative for congestion and sinus pressure.   Respiratory: Negative for cough, chest tightness and shortness of breath.   Cardiovascular: Negative for chest pain, palpitations and leg swelling.  Gastrointestinal: Negative for diarrhea, nausea and vomiting.  Genitourinary: Negative for difficulty urinating and dysuria.  Musculoskeletal: Negative for  joint swelling and myalgias.  Skin: Negative for color change and rash.  Neurological: Negative for headaches.       Describes swimmy headedness.  Unsteady.    Psychiatric/Behavioral: Negative for agitation and dysphoric mood.       Increased stress.         Objective:    Physical Exam Vitals reviewed.  Constitutional:      General: She is not in acute distress.    Appearance: Normal appearance.  HENT:     Head: Normocephalic and atraumatic.     Right Ear: External ear normal.     Left Ear: External ear normal.  Eyes:     General: No scleral icterus.       Right eye: No discharge.        Left eye: No discharge.     Conjunctiva/sclera: Conjunctivae normal.  Neck:     Thyroid: No thyromegaly.  Cardiovascular:     Rate and Rhythm: Normal rate and regular rhythm.  Pulmonary:     Effort: No respiratory distress.     Breath sounds: Normal breath sounds. No wheezing.  Abdominal:     General: Bowel sounds are normal.     Palpations: Abdomen is soft.     Tenderness: There is no abdominal tenderness.  Musculoskeletal:        General: No swelling or tenderness.     Cervical back: Neck supple. No tenderness.  Lymphadenopathy:     Cervical: No cervical adenopathy.  Skin:    Findings: No erythema or rash.  Neurological:     Mental Status: She is alert.  Psychiatric:        Behavior: Behavior normal.     BP 110/62   Pulse 83    Temp (!) 97 F (36.1 C) (Temporal)   Resp 16   Ht 5\' 4"  (1.626 m)   Wt 108 lb (49 kg)   LMP 12/29/1981   SpO2 98%   BMI 18.54 kg/m  Wt Readings from Last 3 Encounters:  04/10/21 108 lb (49 kg)  02/28/21 106 lb 6.4 oz (48.3 kg)  02/24/21 103 lb (46.7 kg)     Lab Results  Component Value Date   WBC 5.3 08/20/2020   HGB 12.2 08/20/2020   HCT 36.8 08/20/2020   PLT 200.0 08/20/2020   GLUCOSE 77 01/22/2021   CHOL 185 01/22/2021   TRIG 71.0 01/22/2021   HDL 82.20 01/22/2021   LDLDIRECT 128.0 05/25/2013   LDLCALC 89 01/22/2021   ALT 18 01/22/2021   AST 21 01/22/2021   NA 141 01/22/2021   K 4.3 01/22/2021   CL 106 01/22/2021   CREATININE 0.69 01/22/2021   BUN 13 01/22/2021   CO2 29 01/22/2021   TSH 3.84 01/22/2021   HGBA1C 5.7 (H) 04/25/2017    MM 3D SCREEN BREAST BILATERAL  Result Date: 10/18/2020 CLINICAL DATA:  Screening. EXAM: DIGITAL SCREENING BILATERAL MAMMOGRAM WITH TOMO AND CAD COMPARISON:  Previous exam(s). ACR Breast Density Category c: The breast tissue is heterogeneously dense, which may obscure small masses. FINDINGS: There are no findings suspicious for malignancy. Images were processed with CAD. IMPRESSION: No mammographic evidence of malignancy. A result letter of this screening mammogram will be mailed directly to the patient. RECOMMENDATION: Screening mammogram in one year. (Code:SM-B-01Y) BI-RADS CATEGORY  1: Negative. Electronically Signed   By: Everlean Alstrom M.D.   On: 10/18/2020 10:10       Assessment & Plan:   Problem List Items Addressed This Visit  Dizziness    Reports persistent / increased issues with swimmy headedness and unsteadiness.  Has seen ENT previously.  History of CVA.  Given persistence, will obtain MRI to further evaluate.        Relevant Orders   MR Brain W Wo Contrast   Essential hypertension    Continue amlodipine.  Blood pressure doing well.  Follow pressures.  Follow metabolic panel.       Relevant Orders   Basic  metabolic panel   GERD (gastroesophageal reflux disease)    Continue protonix.  No acid reflux reported.  Follow.       History of CVA (cerebrovascular accident)    Continue plavix and lipitor.  Residual effects as outlined.        Relevant Orders   MR Brain W Wo Contrast   Hypercholesterolemia    Continue lipitor.  Follow lipid panel and liver function tests.        Relevant Orders   Hepatic function panel   Lipid panel   Hypothyroidism, unspecified    On thyroid replacement.  Follow tsh.       Mild depression ([place])    Discussed with her today.  Declines medication.  She feels will improved with her mother moving in with her sister.  Follow.       Pelvic fullness   Relevant Orders   Urinalysis, Routine w reflex microscopic (Completed)   Urine Culture (Completed)   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 ([place])    Followed by rheumatology.  Stable.       Spastic hemiparesis of left nondominant side due to acute cerebral infarction (HCC)    Residual from the previous stroke.  Able to do ADLs.  Follow.        Other Visit Diagnoses    Anemia, unspecified type    -  Primary   Relevant Orders   CBC with Differential/Platelet   Vitamin B12   IBC + Ferritin       Einar Pheasant, MD

## 2021-04-11 LAB — URINE CULTURE
MICRO NUMBER:: 11771160
SPECIMEN QUALITY:: ADEQUATE

## 2021-04-12 ENCOUNTER — Encounter: Payer: Self-pay | Admitting: Internal Medicine

## 2021-04-12 NOTE — Assessment & Plan Note (Signed)
Reports persistent / increased issues with swimmy headedness and unsteadiness.  Has seen ENT previously.  History of CVA.  Given persistence, will obtain MRI to further evaluate.

## 2021-04-12 NOTE — Assessment & Plan Note (Signed)
Continue amlodipine.  Blood pressure doing well.  Follow pressures.  Follow metabolic panel.

## 2021-04-12 NOTE — Assessment & Plan Note (Signed)
Residual from the previous stroke.  Able to do ADLs.  Follow.

## 2021-04-12 NOTE — Assessment & Plan Note (Signed)
Continue plavix and lipitor.  Residual effects as outlined.

## 2021-04-12 NOTE — Assessment & Plan Note (Signed)
Followed by rheumatology.  Stable.

## 2021-04-12 NOTE — Assessment & Plan Note (Signed)
Continue protonix.  No acid reflux reported.  Follow.  

## 2021-04-12 NOTE — Assessment & Plan Note (Signed)
On thyroid replacement.  Follow tsh.  

## 2021-04-12 NOTE — Assessment & Plan Note (Signed)
Discussed with her today.  Declines medication.  She feels will improved with her mother moving in with her sister.  Follow.

## 2021-04-12 NOTE — Assessment & Plan Note (Signed)
Continue lipitor.  Follow lipid panel and liver function tests.   

## 2021-04-15 ENCOUNTER — Telehealth: Payer: Self-pay

## 2021-04-15 ENCOUNTER — Other Ambulatory Visit: Payer: Self-pay | Admitting: Internal Medicine

## 2021-04-15 DIAGNOSIS — R82998 Other abnormal findings in urine: Secondary | ICD-10-CM

## 2021-04-15 NOTE — Telephone Encounter (Signed)
Pt returned your call about xray. I tried to schedule one and she states that she is waiting to hear back from you about an ultrasound. Please advise

## 2021-04-15 NOTE — Progress Notes (Signed)
Order placed for KUB. Note sent to [facility] to schedule.

## 2021-04-15 NOTE — Telephone Encounter (Signed)
LMTCB

## 2021-04-16 ENCOUNTER — Other Ambulatory Visit: Payer: PPO

## 2021-04-16 ENCOUNTER — Ambulatory Visit (INDEPENDENT_AMBULATORY_CARE_PROVIDER_SITE_OTHER): Payer: PPO

## 2021-04-16 ENCOUNTER — Other Ambulatory Visit: Payer: Self-pay

## 2021-04-16 DIAGNOSIS — R82998 Other abnormal findings in urine: Secondary | ICD-10-CM

## 2021-04-16 DIAGNOSIS — M419 Scoliosis, unspecified: Secondary | ICD-10-CM | POA: Diagnosis not present

## 2021-04-16 NOTE — Telephone Encounter (Signed)
Spoke with pt and she stated that she will be coming by today around 3 for the xray.

## 2021-04-16 NOTE — Telephone Encounter (Signed)
Pt called returning a call  °

## 2021-04-23 ENCOUNTER — Telehealth: Payer: Self-pay | Admitting: Internal Medicine

## 2021-04-23 NOTE — Telephone Encounter (Signed)
PT called in regarding to taking Miralax. Stating she took a bit too much and her bowels have been watery. Wants too know what she needs to do.

## 2021-04-23 NOTE — Telephone Encounter (Signed)
Can you triage her and confirm what is going on? Dr Nicki Reaper is out of office this PM.

## 2021-04-23 NOTE — Telephone Encounter (Signed)
Patient stated Monday she took a heaping cap full and she has been having small watery stools, she hasn't taken any since because she is having those sx. She believe she took too much. She believes her stomach may be upset. Patient has had the urgency to go have a BM but only a little comes out now. Patient is tired, and not wanting to do anything. She wanted PCP to know about her sx and get her opinion on sx. She stated she will be home by 12 tomorrow. She stated they have discussed her sx previously.

## 2021-04-23 NOTE — Telephone Encounter (Signed)
In reviewing note, she took increased amount of miralax Monday.  Increased loose stool since.  Please confirm if she is still having loose stool.  Any other symptoms, for example - abdominal pain, fever, etc.  Confirm she has not been on abx recently.  Confirm if still taking miralax.  Have her add fiber (benefiber - can mix in whatever she drinks in am).  Also can add probiotic daily.  If acute symptoms or if symptoms persists, need to let us know.

## 2021-04-24 NOTE — Telephone Encounter (Signed)
Pt did stop taking the Miralax & stool was starting to become a larger shape. Stool also not as runny. She is also feeling better. She said that she will pick up benefiber & probiotic possibly Align. She just wants to know what are crystals that have been seen in her urine & is this causing her bladder pain? She has CT done tomorrow she says & sees Dr. Pryor Ochoa.

## 2021-04-24 NOTE — Telephone Encounter (Signed)
LMTCB

## 2021-04-25 ENCOUNTER — Other Ambulatory Visit: Payer: Self-pay

## 2021-04-25 ENCOUNTER — Ambulatory Visit
Admission: RE | Admit: 2021-04-25 | Discharge: 2021-04-25 | Disposition: A | Discharge status: Home / Self Care | Payer: PPO | Source: Ambulatory Visit | Attending: Internal Medicine | Admitting: Internal Medicine

## 2021-04-25 DIAGNOSIS — R42 Dizziness and giddiness: Secondary | ICD-10-CM | POA: Diagnosis not present

## 2021-04-25 DIAGNOSIS — R29898 Other symptoms and signs involving the musculoskeletal system: Secondary | ICD-10-CM | POA: Diagnosis not present

## 2021-04-25 DIAGNOSIS — Z8673 Personal history of transient ischemic attack (TIA), and cerebral infarction without residual deficits: Secondary | ICD-10-CM | POA: Insufficient documentation

## 2021-04-25 DIAGNOSIS — R531 Weakness: Secondary | ICD-10-CM | POA: Diagnosis not present

## 2021-04-25 MED ORDER — GADOBUTROL 1 MMOL/ML IV SOLN
5.0000 mL | Freq: Once | INTRAVENOUS | Status: AC | PRN
  Administered 2021-04-25: 5 mL via INTRAVENOUS

## 2021-04-25 NOTE — Telephone Encounter (Signed)
Patient stated that she couldn't talk long but she would think about having an appt to discuss below because she had some questions. Confirmed bowels were back to normal and she would be in touch at a later time. She had to get ready for her ct scan. Advised patient to let me know if she needs anything

## 2021-04-25 NOTE — Telephone Encounter (Signed)
I am glad her bowels have improved.  Let us now if recurring problems.  Regarding the crystals, we will monitor to confirm no development of kidney stones.

## 2021-05-05 DIAGNOSIS — M0609 Rheumatoid arthritis without rheumatoid factor, multiple sites: Secondary | ICD-10-CM | POA: Diagnosis not present

## 2021-05-12 ENCOUNTER — Other Ambulatory Visit: Payer: Self-pay | Admitting: Internal Medicine

## 2021-05-13 DIAGNOSIS — I6389 Other cerebral infarction: Secondary | ICD-10-CM | POA: Diagnosis not present

## 2021-05-13 DIAGNOSIS — M159 Polyosteoarthritis, unspecified: Secondary | ICD-10-CM | POA: Diagnosis not present

## 2021-05-16 DIAGNOSIS — R42 Dizziness and giddiness: Secondary | ICD-10-CM | POA: Diagnosis not present

## 2021-05-16 DIAGNOSIS — H903 Sensorineural hearing loss, bilateral: Secondary | ICD-10-CM | POA: Diagnosis not present

## 2021-06-06 ENCOUNTER — Other Ambulatory Visit: Payer: Self-pay | Admitting: Internal Medicine

## 2021-06-06 DIAGNOSIS — L309 Dermatitis, unspecified: Secondary | ICD-10-CM | POA: Diagnosis not present

## 2021-06-06 DIAGNOSIS — L728 Other follicular cysts of the skin and subcutaneous tissue: Secondary | ICD-10-CM | POA: Diagnosis not present

## 2021-06-09 ENCOUNTER — Other Ambulatory Visit: Payer: Self-pay

## 2021-06-09 ENCOUNTER — Other Ambulatory Visit (INDEPENDENT_AMBULATORY_CARE_PROVIDER_SITE_OTHER): Payer: PPO

## 2021-06-09 DIAGNOSIS — D649 Anemia, unspecified: Secondary | ICD-10-CM | POA: Diagnosis not present

## 2021-06-09 DIAGNOSIS — I10 Essential (primary) hypertension: Secondary | ICD-10-CM

## 2021-06-09 DIAGNOSIS — M7581 Other shoulder lesions, right shoulder: Secondary | ICD-10-CM | POA: Diagnosis not present

## 2021-06-09 DIAGNOSIS — E7800 Pure hypercholesterolemia, unspecified: Secondary | ICD-10-CM | POA: Diagnosis not present

## 2021-06-09 LAB — CBC WITH DIFFERENTIAL/PLATELET
Basophils Absolute: 0.1 10*3/uL (ref 0.0–0.1)
Basophils Relative: 1.4 % (ref 0.0–3.0)
Eosinophils Absolute: 0.2 10*3/uL (ref 0.0–0.7)
Eosinophils Relative: 3.3 % (ref 0.0–5.0)
HCT: 39.3 % (ref 36.0–46.0)
Hemoglobin: 12.7 g/dL (ref 12.0–15.0)
Lymphocytes Relative: 31.5 % (ref 12.0–46.0)
Lymphs Abs: 1.6 10*3/uL (ref 0.7–4.0)
MCHC: 32.2 g/dL (ref 30.0–36.0)
MCV: 92 fl (ref 78.0–100.0)
Monocytes Absolute: 0.5 10*3/uL (ref 0.1–1.0)
Monocytes Relative: 9.9 % (ref 3.0–12.0)
Neutro Abs: 2.7 10*3/uL (ref 1.4–7.7)
Neutrophils Relative %: 53.9 % (ref 43.0–77.0)
Platelets: 237 10*3/uL (ref 150.0–400.0)
RBC: 4.27 Mil/uL (ref 3.87–5.11)
RDW: 14 % (ref 11.5–15.5)
WBC: 5 10*3/uL (ref 4.0–10.5)

## 2021-06-09 LAB — BASIC METABOLIC PANEL
BUN: 17 mg/dL (ref 6–23)
CO2: 27 mEq/L (ref 19–32)
Calcium: 9 mg/dL (ref 8.4–10.5)
Chloride: 108 mEq/L (ref 96–112)
Creatinine, Ser: 0.74 mg/dL (ref 0.40–1.20)
GFR: 79.06 mL/min (ref >60.00)
Glucose, Bld: 90 mg/dL (ref 70–99)
Potassium: 4.2 mEq/L (ref 3.5–5.1)
Sodium: 142 mEq/L (ref 135–145)

## 2021-06-09 LAB — HEPATIC FUNCTION PANEL
ALT: 15 U/L (ref 0–35)
AST: 20 U/L (ref 0–37)
Albumin: 4.1 g/dL (ref 3.5–5.2)
Alkaline Phosphatase: 88 U/L (ref 39–117)
Bilirubin, Direct: 0.1 mg/dL (ref 0.0–0.3)
Total Bilirubin: 0.5 mg/dL (ref 0.2–1.2)
Total Protein: 7 g/dL (ref 6.0–8.3)

## 2021-06-09 LAB — IBC + FERRITIN
Ferritin: 34.4 ng/mL (ref 10.0–291.0)
Iron: 46 ug/dL (ref 42–145)
Saturation Ratios: 12.6 % — ABNORMAL LOW (ref 20.0–50.0)
Transferrin: 261 mg/dL (ref 212.0–360.0)

## 2021-06-09 LAB — LIPID PANEL
Cholesterol: 131 mg/dL (ref 0–200)
HDL: 58.4 mg/dL (ref >39.00)
LDL Cholesterol: 63 mg/dL (ref 0–99)
NonHDL: 72.18
Total CHOL/HDL Ratio: 2
Triglycerides: 48 mg/dL (ref 0.0–149.0)
VLDL: 9.6 mg/dL (ref 0.0–40.0)

## 2021-06-09 LAB — VITAMIN B12: Vitamin B-12: 183 pg/mL — ABNORMAL LOW (ref 211–911)

## 2021-06-11 ENCOUNTER — Encounter: Payer: Self-pay | Admitting: Internal Medicine

## 2021-06-11 ENCOUNTER — Telehealth (INDEPENDENT_AMBULATORY_CARE_PROVIDER_SITE_OTHER): Payer: PPO | Admitting: Internal Medicine

## 2021-06-11 VITALS — BP 120/72 | Ht 64.0 in | Wt 107.0 lb

## 2021-06-11 DIAGNOSIS — E039 Hypothyroidism, unspecified: Secondary | ICD-10-CM

## 2021-06-11 DIAGNOSIS — F32A Depression, unspecified: Secondary | ICD-10-CM

## 2021-06-11 DIAGNOSIS — I639 Cerebral infarction, unspecified: Secondary | ICD-10-CM | POA: Diagnosis not present

## 2021-06-11 DIAGNOSIS — E7800 Pure hypercholesterolemia, unspecified: Secondary | ICD-10-CM

## 2021-06-11 DIAGNOSIS — R42 Dizziness and giddiness: Secondary | ICD-10-CM

## 2021-06-11 DIAGNOSIS — E538 Deficiency of other specified B group vitamins: Secondary | ICD-10-CM | POA: Diagnosis not present

## 2021-06-11 DIAGNOSIS — F320 Major depressive disorder, single episode, mild: Secondary | ICD-10-CM | POA: Diagnosis not present

## 2021-06-11 DIAGNOSIS — G8114 Spastic hemiplegia affecting left nondominant side: Secondary | ICD-10-CM

## 2021-06-11 DIAGNOSIS — M0600 Rheumatoid arthritis without rheumatoid factor, unspecified site: Secondary | ICD-10-CM | POA: Diagnosis not present

## 2021-06-11 DIAGNOSIS — I10 Essential (primary) hypertension: Secondary | ICD-10-CM

## 2021-06-11 DIAGNOSIS — K219 Gastro-esophageal reflux disease without esophagitis: Secondary | ICD-10-CM | POA: Diagnosis not present

## 2021-06-11 DIAGNOSIS — M7582 Other shoulder lesions, left shoulder: Secondary | ICD-10-CM

## 2021-06-11 NOTE — Assessment & Plan Note (Signed)
Saw ortho 06/09/21.  S/p injection.  Follow.

## 2021-06-11 NOTE — Assessment & Plan Note (Signed)
Just saw Dr Jefm Bryant 05/13/21.  Off MTX.  Stable.

## 2021-06-11 NOTE — Assessment & Plan Note (Signed)
Continue protonix.  No acid reflux reported.  Follow.  

## 2021-06-11 NOTE — Progress Notes (Signed)
Patient ID: Donna Rhodes, female   DOB: December 21, 1946, 75 y.o.   MRN: [id number]   Virtual Visit via telephone Note  This visit type was conducted due to national recommendations for restrictions regarding the COVID-19 pandemic (e.g. social distancing).  This format is felt to be most appropriate for this patient at this time.  All issues noted in this document were discussed and addressed.  No physical exam was performed (except for noted visual exam findings with Video Visits).   I connected with [name] by telephone and verified that I am speaking with the correct person using two identifiers. Location patient: home Location provider: work Persons participating in the telephonevisit: patient, provider  The limitations, risks, security and privacy concerns of performing an evaluation and management service by telephone and the availability of in person appointments have been discussed.  It has also been discussed with the patient that there may be a patient responsible charge related to this service. The patient expressed understanding and agreed to proceed.   Reason for visit: scheduled follow up appt.   HPI: Follow up appt - f/u regarding history of stroke and increased stress.  Just evaluated by ortho 06/09/21.  for f/u right shoulder pain secondary to impingement/tendinopathy.  Received injection.  Saw Dr Jefm Bryant 05/13/21 - f/u inflammatory arthritis.  Off MTX.  Stable. Tries to stay active. No chest pain or sob reported.  Continues on plavix and lipitor.  No increased cough or congestion.  No acid reflux.  No abdominal pain or cramping.  Bowels moving.  Increased stress with family issues.  Discussed.  Mother has moved in with her sister.  This has helped some. Does not feel needs any further intervention at this time.  Saw ENT for evaluation persistent light headedness/off balance feeling. No vertigo.  Referred to neurology.  She declined further w/up at this time.  Wants to monitor.   Discussed labs and need to start B12 injections.    ROS: See pertinent positives and negatives per HPI.  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  PT 2x/wk.   Lung nodule    stable   Menopausal state    Osteoarthritis    hand involvement, cervical disc disease   Osteoporosis    Pelvic pain in female    Rheumatoid arthritis ([place])    Stroke (cerebrum) ([place]) 04/24/2017   Thyroid nodule    pt unaware of this    Vaginal Pap smear, abnormal    lgsil    Past Surgical History:  Procedure Laterality Date   [place]   left   CATARACT EXTRACTION W/PHACO Right 10/27/2017   Procedure: CATARACT EXTRACTION PHACO AND INTRAOCULAR LENS PLACEMENT ([place]) RIGHT;  Surgeon: Leandrew Koyanagi, MD;  Location: [facility];  Service: Ophthalmology;  Laterality: Right;  requests early (8-8:30 arrival)   CATARACT EXTRACTION W/PHACO Left 11/10/2017   Procedure: CATARACT EXTRACTION PHACO AND INTRAOCULAR LENS PLACEMENT ([place]) LEFT;  Surgeon: Leandrew Koyanagi, MD;  Location: [facility];  Service: Ophthalmology;  Laterality: Left;   CHOLECYSTECTOMY     FINGER SURGERY  1982   laser vein surgery     LOOP RECORDER INSERTION N/A 09/24/2017   Procedure: LOOP RECORDER INSERTION;  Surgeon: Evans Lance, MD;  Location: [facility];  Service: Cardiovascular;  Laterality: N/A;   MANDIBLE RECONSTRUCTION  1992   TOENAIL EXCISION  1998   several   TONSILLECTOMY     TUBAL LIGATION  1977   VAGINAL HYSTERECTOMY  1983   als0- anterior/posterior colporrhaphy   VAGINAL WOUND CLOSURE /  REPAIR  1999, 2000   VAGINAL WOUND CLOSURE / REPAIR      Family History  Problem Relation Age of Onset   Hypertension Father    Dementia Father    Osteoarthritis Mother    Arthritis/Rheumatoid Sister    Breast cancer Other        paternal side   Breast cancer Paternal Aunt    Breast cancer Cousin        maternal   Uterine cancer Maternal Grandmother    Ovarian cancer Neg Hx    Heart disease Neg Hx    Diabetes Neg Hx    Colon cancer Neg Hx     SOCIAL HX: reviewed.    Current Outpatient Medications:    acetaminophen (TYLENOL) 500 MG tablet, Take 1-2 tablets (500-1,000 mg total) by mouth every 6 (six) hours as needed for mild pain., Disp: 30 tablet, Rfl: 0   acyclovir (ZOVIRAX) 200 MG capsule, Take 1 capsule (200 mg total) by mouth 2 (two) times daily., Disp: 180 capsule, Rfl: 3   amLODipine (NORVASC) 2.5 MG tablet, TAKE 1 TABLET BY MOUTH DAILY, Disp: 90 tablet, Rfl: 1   atorvastatin (LIPITOR) 20 MG tablet, TAKE 1 TABLET BY MOUTH DAILY, Disp: 90 tablet, Rfl: 1   calcium-vitamin D (OSCAL WITH D) 250-125 MG-UNIT tablet, Take 1 tablet by mouth daily., Disp: , Rfl:    clopidogrel (PLAVIX) 75 MG tablet, TAKE 1 TABLET BY MOUTH DAILY, Disp: 90 tablet, Rfl: 1   levothyroxine (SYNTHROID) 50 MCG tablet, TAKE 1 TABLET BY MOUTH DAILY, Disp: 90 tablet, Rfl: 0   pantoprazole (PROTONIX) 20 MG tablet, TAKE 1 TABLET BY MOUTH DAILY, Disp: 90 tablet, Rfl: 0  EXAM:  GENERAL: alert. Sounds to be in no acute distress.  Answering questions appropriately.   PSYCH/NEURO: pleasant and cooperative, no obvious depression or anxiety, speech and thought processing grossly intact  ASSESSMENT AND PLAN:  Discussed the following assessment and plan:  Problem List Items Addressed This Visit     B12 deficiency    B12 low on recent labs.  Start B12 injections 1054mcg q week x 4 weeks and then q month.        Dizziness    Symptoms as outlined.  Saw ENT.  W/up unrevealing.  Referred to neurology.  She  declines further w/up at this time.  Wants to monitor.  Will notify me if symptom change or if she changes her mind.        Essential hypertension    Continue amlodipine.  Blood pressure as outlined.  Follow pressures.  Follow metabolic panel.        Relevant Orders   Basic metabolic panel   GERD (gastroesophageal reflux disease)    Continue protonix.  No acid reflux reported.  Follow.        Hypercholesterolemia - Primary    Continue lipitor.  Follow lipid panel and liver function tests.  Relevant Orders   Hepatic function panel   Lipid panel   Hypothyroidism, unspecified    On thyroid replacement.  Follow tsh.        Mild depression ([place])    Discussed increased stress.  Doing some better as outlined.  Desires no further intervention at this time.  Follow.        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)    Just saw Dr Jefm Bryant 05/13/21.  Off MTX.  Stable.        Rotator cuff tendinitis, left    Saw ortho 06/09/21.  S/p injection.  Follow.        Spastic hemiparesis of left nondominant side due to acute cerebral infarction (HCC)    Residual from previous stroke.  Occasional cramping in arm.  Able to perform her ADLs.  Follow.         Return in about 3 months (around 09/11/2021) for physical.   I discussed the assessment and treatment plan with the patient. The patient was provided an opportunity to ask questions and all were answered. The patient agreed with the plan and demonstrated an understanding of the instructions.   The patient was advised to call back or seek an in-person evaluation if the symptoms worsen or if the condition fails to improve as anticipated.  I provided 23 minutes of non-face-to-face time during this encounter.   Einar Pheasant, MD

## 2021-06-11 NOTE — Assessment & Plan Note (Signed)
B12 low on recent labs.  Start B12 injections 1081mcg q week x 4 weeks and then q month.

## 2021-06-11 NOTE — Assessment & Plan Note (Signed)
Continue lipitor.  Follow lipid panel and liver function tests.   

## 2021-06-11 NOTE — Assessment & Plan Note (Signed)
Continue amlodipine.  Blood pressure as outlined.  Follow pressures.  Follow metabolic panel.  

## 2021-06-11 NOTE — Assessment & Plan Note (Signed)
Symptoms as outlined.  Saw ENT.  W/up unrevealing.  Referred to neurology.  She declines further w/up at this time.  Wants to monitor.  Will notify me if symptom change or if she changes her mind.

## 2021-06-11 NOTE — Assessment & Plan Note (Signed)
Discussed increased stress.  Doing some better as outlined.  Desires no further intervention at this time.  Follow.

## 2021-06-11 NOTE — Assessment & Plan Note (Signed)
Residual from previous stroke.  Occasional cramping in arm.  Able to perform her ADLs.  Follow.

## 2021-06-11 NOTE — Assessment & Plan Note (Signed)
On thyroid replacement.  Follow tsh.  

## 2021-06-23 ENCOUNTER — Other Ambulatory Visit: Payer: Self-pay

## 2021-06-23 ENCOUNTER — Telehealth: Payer: Self-pay | Admitting: Internal Medicine

## 2021-06-23 ENCOUNTER — Ambulatory Visit (INDEPENDENT_AMBULATORY_CARE_PROVIDER_SITE_OTHER): Payer: PPO | Admitting: Internal Medicine

## 2021-06-23 DIAGNOSIS — E538 Deficiency of other specified B group vitamins: Secondary | ICD-10-CM

## 2021-06-23 MED ORDER — CYANOCOBALAMIN 1000 MCG/ML IJ SOLN
1000.0000 ug | INTRAMUSCULAR | Status: AC
  Administered 2021-06-23 – 2021-07-01 (×2): 1000 ug via INTRAMUSCULAR

## 2021-06-23 NOTE — Telephone Encounter (Signed)
Appt was scheduled for B 12 injection today, see note below anything else needed.

## 2021-06-23 NOTE — Progress Notes (Signed)
Reviewed.    Dr Rockwell Zentz 

## 2021-06-23 NOTE — Telephone Encounter (Signed)
Nothing further needed 

## 2021-06-25 ENCOUNTER — Ambulatory Visit: Payer: PPO | Admitting: Dermatology

## 2021-07-01 ENCOUNTER — Ambulatory Visit (INDEPENDENT_AMBULATORY_CARE_PROVIDER_SITE_OTHER): Payer: PPO

## 2021-07-01 ENCOUNTER — Other Ambulatory Visit: Payer: Self-pay

## 2021-07-01 DIAGNOSIS — E538 Deficiency of other specified B group vitamins: Secondary | ICD-10-CM

## 2021-07-01 MED ORDER — CYANOCOBALAMIN 1000 MCG/ML IJ SOLN: 1000.0000 ug | Freq: Once | INTRAMUSCULAR | Status: DC

## 2021-07-01 NOTE — Progress Notes (Signed)
Patient presented for B 12 injection to right deltoid, patient voiced no concerns nor showed any signs of distress during injection. 

## 2021-07-08 ENCOUNTER — Other Ambulatory Visit: Payer: Self-pay

## 2021-07-08 ENCOUNTER — Other Ambulatory Visit: Payer: Self-pay | Admitting: Internal Medicine

## 2021-07-08 ENCOUNTER — Ambulatory Visit (INDEPENDENT_AMBULATORY_CARE_PROVIDER_SITE_OTHER): Payer: PPO

## 2021-07-08 ENCOUNTER — Telehealth: Payer: Self-pay | Admitting: Internal Medicine

## 2021-07-08 DIAGNOSIS — E538 Deficiency of other specified B group vitamins: Secondary | ICD-10-CM | POA: Diagnosis not present

## 2021-07-08 MED ORDER — CYANOCOBALAMIN 1000 MCG/ML IJ SOLN
1000.0000 ug | Freq: Once | INTRAMUSCULAR | Status: AC
  Administered 2021-07-08: 1000 ug via INTRAMUSCULAR

## 2021-07-08 NOTE — Progress Notes (Signed)
Patient presented for B 12 injection to right deltoid, patient voiced no concerns nor showed any signs of distress during injection.   Patient would like to have her b12  checked again after next shot. Does she need to stasy on the injection?

## 2021-07-08 NOTE — Telephone Encounter (Signed)
You had sent me a question about rechecking her B12 level.  We just checked 05/2021 and was low.  I would like to continue her on B12 injections for now.  We will recheck her b12 level down the road.

## 2021-07-15 ENCOUNTER — Ambulatory Visit (INDEPENDENT_AMBULATORY_CARE_PROVIDER_SITE_OTHER): Payer: PPO

## 2021-07-15 ENCOUNTER — Telehealth: Payer: Self-pay | Admitting: Internal Medicine

## 2021-07-15 ENCOUNTER — Encounter: Payer: PPO | Admitting: Internal Medicine

## 2021-07-15 ENCOUNTER — Other Ambulatory Visit: Payer: Self-pay

## 2021-07-15 DIAGNOSIS — E538 Deficiency of other specified B group vitamins: Secondary | ICD-10-CM

## 2021-07-15 MED ORDER — CYANOCOBALAMIN 1000 MCG/ML IJ SOLN
1000.0000 ug | Freq: Once | INTRAMUSCULAR | Status: AC
Start: 2021-07-15 — End: 2021-07-15
  Administered 2021-07-15: 1000 ug via INTRAMUSCULAR

## 2021-07-15 NOTE — Progress Notes (Signed)
Pt presents for b12 injection, right deltoid, IM. Pt voiced no concerns.

## 2021-07-15 NOTE — Telephone Encounter (Signed)
PT would like to find out if they need to come before their physical for labs or come fasting the day of for labs. Please advise.

## 2021-07-16 NOTE — Telephone Encounter (Signed)
LMTCB

## 2021-07-16 NOTE — Telephone Encounter (Signed)
Pt returned your call. I tried to get more info about the labs and her coming in for her physical but she wanted to speak with you

## 2021-07-17 NOTE — Telephone Encounter (Signed)
Noted,

## 2021-07-17 NOTE — Telephone Encounter (Signed)
Pt called and I got her fasting labs scheduled prior to her physical in October.

## 2021-07-22 DIAGNOSIS — M79675 Pain in left toe(s): Secondary | ICD-10-CM | POA: Diagnosis not present

## 2021-07-22 DIAGNOSIS — M06071 Rheumatoid arthritis without rheumatoid factor, right ankle and foot: Secondary | ICD-10-CM | POA: Diagnosis not present

## 2021-07-22 DIAGNOSIS — M79674 Pain in right toe(s): Secondary | ICD-10-CM | POA: Diagnosis not present

## 2021-07-22 DIAGNOSIS — M06072 Rheumatoid arthritis without rheumatoid factor, left ankle and foot: Secondary | ICD-10-CM | POA: Diagnosis not present

## 2021-08-13 ENCOUNTER — Encounter: Payer: PPO | Admitting: Obstetrics and Gynecology

## 2021-08-15 ENCOUNTER — Ambulatory Visit (INDEPENDENT_AMBULATORY_CARE_PROVIDER_SITE_OTHER): Payer: PPO

## 2021-08-15 ENCOUNTER — Other Ambulatory Visit: Payer: Self-pay

## 2021-08-15 DIAGNOSIS — E538 Deficiency of other specified B group vitamins: Secondary | ICD-10-CM

## 2021-08-15 MED ORDER — CYANOCOBALAMIN 1000 MCG/ML IJ SOLN
1000.0000 ug | Freq: Once | INTRAMUSCULAR | Status: AC
  Administered 2021-08-15: 1000 ug via INTRAMUSCULAR

## 2021-08-15 NOTE — Progress Notes (Signed)
Patient presented for B 12 injection to left deltoid, patient voiced no concerns nor showed any signs of distress during injection. 

## 2021-08-19 DIAGNOSIS — S6991XA Unspecified injury of right wrist, hand and finger(s), initial encounter: Secondary | ICD-10-CM | POA: Diagnosis not present

## 2021-08-19 DIAGNOSIS — M7989 Other specified soft tissue disorders: Secondary | ICD-10-CM | POA: Diagnosis not present

## 2021-08-25 DIAGNOSIS — M25531 Pain in right wrist: Secondary | ICD-10-CM | POA: Diagnosis not present

## 2021-08-25 DIAGNOSIS — M659 Synovitis and tenosynovitis, unspecified: Secondary | ICD-10-CM | POA: Diagnosis not present

## 2021-08-25 DIAGNOSIS — M778 Other enthesopathies, not elsewhere classified: Secondary | ICD-10-CM | POA: Diagnosis not present

## 2021-08-25 DIAGNOSIS — M25431 Effusion, right wrist: Secondary | ICD-10-CM | POA: Diagnosis not present

## 2021-09-15 DIAGNOSIS — M25562 Pain in left knee: Secondary | ICD-10-CM | POA: Diagnosis not present

## 2021-09-15 DIAGNOSIS — R269 Unspecified abnormalities of gait and mobility: Secondary | ICD-10-CM | POA: Diagnosis not present

## 2021-09-15 DIAGNOSIS — M25431 Effusion, right wrist: Secondary | ICD-10-CM | POA: Diagnosis not present

## 2021-09-15 DIAGNOSIS — M778 Other enthesopathies, not elsewhere classified: Secondary | ICD-10-CM | POA: Diagnosis not present

## 2021-09-15 DIAGNOSIS — M25462 Effusion, left knee: Secondary | ICD-10-CM | POA: Diagnosis not present

## 2021-09-15 DIAGNOSIS — M25531 Pain in right wrist: Secondary | ICD-10-CM | POA: Diagnosis not present

## 2021-09-15 DIAGNOSIS — M659 Synovitis and tenosynovitis, unspecified: Secondary | ICD-10-CM | POA: Diagnosis not present

## 2021-09-16 ENCOUNTER — Telehealth: Payer: Self-pay

## 2021-09-16 ENCOUNTER — Ambulatory Visit (INDEPENDENT_AMBULATORY_CARE_PROVIDER_SITE_OTHER): Payer: PPO

## 2021-09-16 ENCOUNTER — Other Ambulatory Visit: Payer: Self-pay

## 2021-09-16 DIAGNOSIS — E538 Deficiency of other specified B group vitamins: Secondary | ICD-10-CM

## 2021-09-16 MED ORDER — CYANOCOBALAMIN 1000 MCG/ML IJ SOLN
1000.0000 ug | Freq: Once | INTRAMUSCULAR | Status: AC
  Administered 2021-09-16: 1000 ug via INTRAMUSCULAR

## 2021-09-16 NOTE — Telephone Encounter (Signed)
Pt presented today for a b12 injection and informed me that she had a mass on her right bottom eyelid. During the appointment, Pt asks for another referral to Dr. Nehemiah Massed to be seen for the mass. Pt has an appointment with PCP on 10/20/21.

## 2021-09-16 NOTE — Progress Notes (Signed)
Patient presented for B 12 injection to right deltoid, patient voiced no concerns nor showed any signs of distress during injection. 

## 2021-09-17 NOTE — Telephone Encounter (Signed)
Per discussion, has lesion beneath eye.  No pain.  No redness.  No vision change.  Pt requesting dermatology referral.  States has had similar lesion previously.  Please confirm if she saw dermatology previously.  She is requesting dermatology referral.  Given location, may need ophthalmology referral if agreeable.  Can refer to MD she saw previously for similar concern.

## 2021-09-17 NOTE — Telephone Encounter (Signed)
Patient saw Dr Rosaura Carpenter at [facility]. She does not want to be referred right now. Grand daughter is getting married 11/5 so she would like to hold off and discuss at her appt with PCP on 10/24.

## 2021-09-24 DIAGNOSIS — M25562 Pain in left knee: Secondary | ICD-10-CM | POA: Diagnosis not present

## 2021-09-24 DIAGNOSIS — M2392 Unspecified internal derangement of left knee: Secondary | ICD-10-CM | POA: Diagnosis not present

## 2021-10-03 ENCOUNTER — Encounter: Payer: Self-pay | Admitting: Internal Medicine

## 2021-10-03 ENCOUNTER — Other Ambulatory Visit: Payer: Self-pay

## 2021-10-03 ENCOUNTER — Ambulatory Visit: Payer: PPO | Admitting: Internal Medicine

## 2021-10-03 VITALS — BP 128/64 | HR 71 | Ht 64.0 in | Wt 109.8 lb

## 2021-10-03 DIAGNOSIS — R002 Palpitations: Secondary | ICD-10-CM

## 2021-10-03 DIAGNOSIS — I639 Cerebral infarction, unspecified: Secondary | ICD-10-CM | POA: Diagnosis not present

## 2021-10-03 DIAGNOSIS — R0989 Other specified symptoms and signs involving the circulatory and respiratory systems: Secondary | ICD-10-CM | POA: Diagnosis not present

## 2021-10-03 DIAGNOSIS — I10 Essential (primary) hypertension: Secondary | ICD-10-CM | POA: Diagnosis not present

## 2021-10-03 NOTE — Patient Instructions (Addendum)
Medication Instructions:  Your physician recommends that you continue on your current medications as directed. Please refer to the Current Medication list given to you today.  Labwork: None ordered.  Testing/Procedures: Your physician has requested that you have a carotid duplex. This test is an ultrasound of the carotid arteries in your neck. It looks at blood flow through these arteries that supply the brain with blood. Allow one hour for this exam. There are no restrictions or special instructions.   Follow-Up: Your physician wants you to follow-up in: one year with Cristopher Peru, MD or one of the following Advanced Practice Providers on your designated Care Team:   Tommye Standard, [place] Legrand Como "Jonni Sanger" Chalmers Cater, [place]  Any Other Special Instructions Will Be Listed Below (If Applicable).  If you need a refill on your cardiac medications before your next appointment, please call your pharmacy.

## 2021-10-03 NOTE — Progress Notes (Signed)
HPI Donna Rhodes returns today for followup after undergoing ILR removal 2 years ago. She is a pleasant 75 yo woman who sustatined a cryptogenic stroke over 4 years ago. She initially had an ILR inserted but had no evidence of atrial fib. She remains quite anxious and worried about having another stroke. She has many questions regarding the possible etiology of her stroke. She notes occaisional palpitations which were due to PVC's and PAC's when she had an ILR. She wants to know if she needs to continue the plavix. Allergies  Allergen Reactions   Amoxicillin Other (See Comments)    Has patient had a PCN reaction causing immediate rash, facial/tongue/throat swelling, SOB or lightheadedness with hypotension: No Has patient had a PCN reaction causing severe rash involving mucus membranes or skin necrosis: No Has patient had a PCN reaction that required hospitalization: No Has patient had a PCN reaction occurring within the last 10 years: No If all of the above answers are "NO", then may proceed with Cephalosporin use.   Irritated tongue   Avelox [Moxifloxacin Hcl In Nacl]     Tongue irritation   Azithromycin Other (See Comments)    Caused mouth and tongue to be sore   Bacitracin     Itching and rash    Codeine     "feels like I'm in a barrel"   [employer] Weight Management [Nutritional Supplements]     Mouth soreness   Fish Oil     Other reaction(s): Other (See Comments) Mouth soreness Mouth soreness   Food     Walnuts --mouth breaks out   Moxifloxacin Other (See Comments)    Tongue irritation   Naproxen     Tongue irritation   Nsaids Other (See Comments)    Oral inflammation   Cefepime Rash     Current Outpatient Medications  Medication Sig Dispense Refill   acetaminophen (TYLENOL) 500 MG tablet Take 1-2 tablets (500-1,000 mg total) by mouth every 6 (six) hours as needed for mild pain. 30 tablet 0   acyclovir (ZOVIRAX) 200 MG capsule Take 1 capsule (200 mg total) by  mouth 2 (two) times daily. 180 capsule 3   amLODipine (NORVASC) 2.5 MG tablet TAKE 1 TABLET BY MOUTH DAILY 90 tablet 1   atorvastatin (LIPITOR) 20 MG tablet TAKE 1 TABLET BY MOUTH DAILY 90 tablet 1   calcium-vitamin D (OSCAL WITH D) 250-125 MG-UNIT tablet Take 1 tablet by mouth daily.     clopidogrel (PLAVIX) 75 MG tablet TAKE 1 TABLET BY MOUTH DAILY 90 tablet 1   levothyroxine (SYNTHROID) 50 MCG tablet TAKE 1 TABLET BY MOUTH DAILY 90 tablet 0   pantoprazole (PROTONIX) 20 MG tablet TAKE 1 TABLET BY MOUTH DAILY 90 tablet 0   No current facility-administered medications for this visit.     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  PT 2x/wk.   Lung nodule    stable   Menopausal state    Osteoarthritis    hand involvement, cervical disc disease   Osteoporosis  Pelvic pain in female    Rheumatoid arthritis ([place])    Stroke (cerebrum) ([place]) 04/24/2017   Thyroid nodule    pt unaware of this    Vaginal Pap smear, abnormal    lgsil    ROS:   All systems reviewed and negative except as noted in the HPI.   Past Surgical History:  Procedure Laterality Date   [place]   left   CATARACT EXTRACTION W/PHACO Right 10/27/2017   Procedure: CATARACT EXTRACTION PHACO AND INTRAOCULAR LENS PLACEMENT ([place]) RIGHT;  Surgeon: Leandrew Koyanagi, MD;  Location: [facility];  Service: Ophthalmology;  Laterality: Right;  requests early (8-8:30 arrival)   CATARACT EXTRACTION W/PHACO Left 11/10/2017   Procedure: CATARACT EXTRACTION PHACO AND INTRAOCULAR LENS PLACEMENT ([place]) LEFT;   Surgeon: Leandrew Koyanagi, MD;  Location: [facility];  Service: Ophthalmology;  Laterality: Left;   CHOLECYSTECTOMY     FINGER SURGERY  1982   laser vein surgery     LOOP RECORDER INSERTION N/A 09/24/2017   Procedure: LOOP RECORDER INSERTION;  Surgeon: Evans Lance, MD;  Location: [facility];  Service: Cardiovascular;  Laterality: N/A;   MANDIBLE RECONSTRUCTION  1992   TOENAIL EXCISION  1998   several   TONSILLECTOMY     TUBAL LIGATION  1977   VAGINAL HYSTERECTOMY  1983   als0- anterior/posterior colporrhaphy   VAGINAL WOUND CLOSURE / REPAIR  1999, 2000   VAGINAL WOUND CLOSURE / REPAIR       Family History  Problem Relation Age of Onset   Hypertension Father    Dementia Father    Osteoarthritis Mother    Arthritis/Rheumatoid Sister    Breast cancer Other        paternal side   Breast cancer Paternal Aunt    Breast cancer Cousin        maternal   Uterine cancer Maternal Grandmother    Ovarian cancer Neg Hx    Heart disease Neg Hx    Diabetes Neg Hx    Colon cancer Neg Hx      Social History   Socioeconomic History   Marital status: Divorced    Spouse name: Not on file   Number of children: 3   Years of education: 12   Highest education level: Not on file  Occupational History    Comment: retired  Tobacco Use   Smoking status: Former    Types: Cigarettes    Quit date: 12/29/1979    Years since quitting: 41.7   Smokeless tobacco: Never  Vaping Use   Vaping Use: Never used  Substance and Sexual Activity   Alcohol use: No    Alcohol/week: 0.0 standard drinks   Drug use: No   Sexual activity: Not Currently    Birth control/protection: Surgical  Other Topics Concern   Not on file  Social History Narrative   Her mother and her daughter live with her   Caffeine- 1 cup coffee   Social Determinants of Health   Financial Resource Strain: Low Risk    Difficulty of Paying Living Expenses: Not hard at all  Food Insecurity: No Food  Insecurity   Worried About Charity fundraiser in the Last Year: Never true   Ran Out of Food in the Last Year: Never true  Transportation Needs: No Transportation Needs   Lack of Transportation (Medical): No   Lack of Transportation (Non-Medical): No  Physical Activity: Not  on file  Stress: No Stress Concern Present   Feeling of Stress : Only a little  Social Connections: Unknown   Frequency of Communication with Friends and Family: More than three times a week   Frequency of Social Gatherings with Friends and Family: More than three times a week   Attends Religious Services: Not on Electrical engineer or Organizations: Not on file   Attends Archivist Meetings: Not on file   Marital Status: Not on file  Intimate Partner Violence: Not At Risk   Fear of Current or Ex-Partner: No   Emotionally Abused: No   Physically Abused: No   Sexually Abused: No     BP 128/64   Pulse 71   Ht 5\' 4"  (1.626 m)   Wt 109 lb 12.8 oz (49.8 kg)   LMP 12/29/1981   SpO2 96%   BMI 18.85 kg/m   Physical Exam:  Well appearing NAD HEENT: Unremarkable Neck:  No JVD, no thyromegally; soft carotid bruit Lymphatics:  No adenopathy Back:  No CVA tenderness Lungs:  Clear with no wheezes HEART:  Regular rate rhythm, no murmurs, no rubs, no clicks Abd:  soft, positive bowel sounds, no organomegally, no rebound, no guarding Ext:  2 plus pulses, no edema, no cyanosis, no clubbing Skin:  No rashes no nodules Neuro:  CN II through XII intact, motor grossly intact  EKG - nsr   Assess/Plan: 1. Cryptogenic stroke - still no etiology. She will undergo watchful waiting. We discussed switching to ASA instead of plavix but for now I rec that she continue this medication.  2. PVC's - she will continue her current meds. She is minimally symptomatic.  3. HTN - her bp is well controlled. She will continue her current meds. 4. Anxiety - she is anxious and tearful regarding stress at home. I  asked her to increase her activity.   Mikle Bosworth.D.

## 2021-10-06 DIAGNOSIS — M0609 Rheumatoid arthritis without rheumatoid factor, multiple sites: Secondary | ICD-10-CM | POA: Diagnosis not present

## 2021-10-06 DIAGNOSIS — I6389 Other cerebral infarction: Secondary | ICD-10-CM | POA: Diagnosis not present

## 2021-10-06 DIAGNOSIS — M159 Polyosteoarthritis, unspecified: Secondary | ICD-10-CM | POA: Diagnosis not present

## 2021-10-08 DIAGNOSIS — M25562 Pain in left knee: Secondary | ICD-10-CM | POA: Diagnosis not present

## 2021-10-08 DIAGNOSIS — M65839 Other synovitis and tenosynovitis, unspecified forearm: Secondary | ICD-10-CM | POA: Diagnosis not present

## 2021-10-08 DIAGNOSIS — M25531 Pain in right wrist: Secondary | ICD-10-CM | POA: Diagnosis not present

## 2021-10-15 DIAGNOSIS — S63591A Other specified sprain of right wrist, initial encounter: Secondary | ICD-10-CM | POA: Diagnosis not present

## 2021-10-15 DIAGNOSIS — M25531 Pain in right wrist: Secondary | ICD-10-CM | POA: Diagnosis not present

## 2021-10-15 DIAGNOSIS — M25562 Pain in left knee: Secondary | ICD-10-CM | POA: Diagnosis not present

## 2021-10-15 DIAGNOSIS — M65839 Other synovitis and tenosynovitis, unspecified forearm: Secondary | ICD-10-CM | POA: Diagnosis not present

## 2021-10-17 ENCOUNTER — Other Ambulatory Visit: Payer: Self-pay

## 2021-10-17 ENCOUNTER — Other Ambulatory Visit (INDEPENDENT_AMBULATORY_CARE_PROVIDER_SITE_OTHER): Payer: PPO

## 2021-10-17 DIAGNOSIS — E7800 Pure hypercholesterolemia, unspecified: Secondary | ICD-10-CM

## 2021-10-17 DIAGNOSIS — I10 Essential (primary) hypertension: Secondary | ICD-10-CM

## 2021-10-17 LAB — HEPATIC FUNCTION PANEL
ALT: 16 U/L (ref 0–35)
AST: 20 U/L (ref 0–37)
Albumin: 4 g/dL (ref 3.5–5.2)
Alkaline Phosphatase: 88 U/L (ref 39–117)
Bilirubin, Direct: 0.1 mg/dL (ref 0.0–0.3)
Total Bilirubin: 0.5 mg/dL (ref 0.2–1.2)
Total Protein: 6.6 g/dL (ref 6.0–8.3)

## 2021-10-17 LAB — BASIC METABOLIC PANEL
BUN: 20 mg/dL (ref 6–23)
CO2: 29 mEq/L (ref 19–32)
Calcium: 9.1 mg/dL (ref 8.4–10.5)
Chloride: 108 mEq/L (ref 96–112)
Creatinine, Ser: 0.61 mg/dL (ref 0.40–1.20)
GFR: 87.14 mL/min (ref >60.00)
Glucose, Bld: 83 mg/dL (ref 70–99)
Potassium: 4.2 mEq/L (ref 3.5–5.1)
Sodium: 142 mEq/L (ref 135–145)

## 2021-10-17 LAB — LIPID PANEL
Cholesterol: 147 mg/dL (ref 0–200)
HDL: 68.5 mg/dL (ref >39.00)
LDL Cholesterol: 67 mg/dL (ref 0–99)
NonHDL: 78.92
Total CHOL/HDL Ratio: 2
Triglycerides: 58 mg/dL (ref 0.0–149.0)
VLDL: 11.6 mg/dL (ref 0.0–40.0)

## 2021-10-20 ENCOUNTER — Other Ambulatory Visit: Payer: Self-pay | Admitting: Internal Medicine

## 2021-10-20 ENCOUNTER — Encounter: Payer: PPO | Admitting: Internal Medicine

## 2021-10-20 ENCOUNTER — Other Ambulatory Visit: Payer: Self-pay

## 2021-10-20 ENCOUNTER — Ambulatory Visit (INDEPENDENT_AMBULATORY_CARE_PROVIDER_SITE_OTHER): Payer: PPO | Admitting: Internal Medicine

## 2021-10-20 DIAGNOSIS — E538 Deficiency of other specified B group vitamins: Secondary | ICD-10-CM

## 2021-10-20 DIAGNOSIS — Z1231 Encounter for screening mammogram for malignant neoplasm of breast: Secondary | ICD-10-CM

## 2021-10-20 DIAGNOSIS — Z8673 Personal history of transient ischemic attack (TIA), and cerebral infarction without residual deficits: Secondary | ICD-10-CM

## 2021-10-20 DIAGNOSIS — I10 Essential (primary) hypertension: Secondary | ICD-10-CM | POA: Diagnosis not present

## 2021-10-20 DIAGNOSIS — M0600 Rheumatoid arthritis without rheumatoid factor, unspecified site: Secondary | ICD-10-CM | POA: Diagnosis not present

## 2021-10-20 DIAGNOSIS — E039 Hypothyroidism, unspecified: Secondary | ICD-10-CM

## 2021-10-20 DIAGNOSIS — L989 Disorder of the skin and subcutaneous tissue, unspecified: Secondary | ICD-10-CM

## 2021-10-20 DIAGNOSIS — M25561 Pain in right knee: Secondary | ICD-10-CM | POA: Diagnosis not present

## 2021-10-20 DIAGNOSIS — K219 Gastro-esophageal reflux disease without esophagitis: Secondary | ICD-10-CM | POA: Diagnosis not present

## 2021-10-20 DIAGNOSIS — F32A Depression, unspecified: Secondary | ICD-10-CM | POA: Diagnosis not present

## 2021-10-20 DIAGNOSIS — G8320 Monoplegia of upper limb affecting unspecified side: Secondary | ICD-10-CM | POA: Diagnosis not present

## 2021-10-20 DIAGNOSIS — E7800 Pure hypercholesterolemia, unspecified: Secondary | ICD-10-CM

## 2021-10-20 NOTE — Progress Notes (Signed)
Patient ID: Donna Rhodes, female   DOB: 1946/06/09, 75 y.o.   MRN: [id number]   Subjective:    Patient ID: Donna Rhodes, female    DOB: 1946-03-14, 75 y.o.   MRN: [id number]  This visit occurred during the SARS-CoV-2 public health emergency.  Safety protocols were in place, including screening questions prior to the visit, additional usage of staff PPE, and extensive cleaning of exam room while observing appropriate contact time as indicated for disinfecting solutions.   Patient here for a scheduled follow up .   HPI Here to follow up regarding her cholesterol, blood pressure.  History of CVA.  Maintained on plavix.  Discussed f/u with neurology.  Increased stress and some frustration with residual stroke issues.  Unable to do all of the things she did previously.  Increased family stress.  Discussed.  Desires no further intervention.  Seeing Dr Jefm Bryant for f/u arthritis.  On MTX and folic acid.  Seeing Dr Amedeo Plenty for her hand.  Using voltaren gel, ice and wearing a brace.  S/p cortisone injection.  Still with persistent knee issues.  Wants to hold on any further w/up at this time.  Does have lesion under her right eye.  Request referral to dermatology.     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  PT 2x/wk.   Lung nodule    stable   Menopausal state    Osteoarthritis    hand involvement, cervical disc disease   Osteoporosis    Pelvic pain in female    Rheumatoid arthritis ([place])    Stroke (cerebrum) ([place]) 04/24/2017   Thyroid nodule    pt unaware of this    Vaginal Pap smear, abnormal    lgsil   Past Surgical History:   Procedure Laterality Date   [place]   left   CATARACT EXTRACTION W/PHACO Right 10/27/2017   Procedure: CATARACT EXTRACTION PHACO AND INTRAOCULAR LENS PLACEMENT ([place]) RIGHT;  Surgeon: Leandrew Koyanagi, MD;  Location: [facility];  Service: Ophthalmology;  Laterality: Right;  requests early (8-8:30 arrival)   CATARACT EXTRACTION W/PHACO Left 11/10/2017   Procedure: CATARACT EXTRACTION PHACO AND INTRAOCULAR LENS PLACEMENT ([place]) LEFT;  Surgeon: Leandrew Koyanagi, MD;  Location: [facility];  Service: Ophthalmology;  Laterality: Left;   CHOLECYSTECTOMY     FINGER SURGERY  1982   laser vein surgery     LOOP RECORDER INSERTION N/A 09/24/2017   Procedure: LOOP RECORDER INSERTION;  Surgeon: Evans Lance, MD;  Location: [facility];  Service: Cardiovascular;  Laterality: N/A;   MANDIBLE RECONSTRUCTION  1992   TOENAIL EXCISION  1998   several   TONSILLECTOMY     TUBAL LIGATION  1977   VAGINAL HYSTERECTOMY  1983   als0- anterior/posterior colporrhaphy   VAGINAL WOUND CLOSURE / [place], 2000   VAGINAL WOUND CLOSURE / REPAIR     Family History  Problem Relation Age of Onset   Hypertension Father    Dementia  Father    Osteoarthritis Mother    Arthritis/Rheumatoid Sister    Breast cancer Other        paternal side   Breast cancer Paternal Aunt    Breast cancer Cousin        maternal   Uterine cancer Maternal Grandmother    Ovarian cancer Neg Hx    Heart disease Neg Hx    Diabetes Neg Hx    Colon cancer Neg Hx    Social History   Socioeconomic History   Marital status: Divorced    Spouse name: Not on file   Number of children: 3   Years of education: 12   Highest education level: Not on file  Occupational History    Comment: retired  Tobacco Use   Smoking status: Former    Types: Cigarettes    Quit date: 12/29/1979    Years since quitting: 41.8   Smokeless tobacco: Never  Vaping Use    Vaping Use: Never used  Substance and Sexual Activity   Alcohol use: No    Alcohol/week: 0.0 standard drinks   Drug use: No   Sexual activity: Not Currently    Birth control/protection: Surgical  Other Topics Concern   Not on file  Social History Narrative   Her mother and her daughter live with her   Caffeine- 1 cup coffee   Social Determinants of Health   Financial Resource Strain: Low Risk    Difficulty of Paying Living Expenses: Not hard at all  Food Insecurity: No Food Insecurity   Worried About Charity fundraiser in the Last Year: Never true   Ran Out of Food in the Last Year: Never true  Transportation Needs: No Transportation Needs   Lack of Transportation (Medical): No   Lack of Transportation (Non-Medical): No  Physical Activity: Not on file  Stress: No Stress Concern Present   Feeling of Stress : Only a little  Social Connections: Unknown   Frequency of Communication with Friends and Family: More than three times a week   Frequency of Social Gatherings with Friends and Family: More than three times a week   Attends Religious Services: Not on Electrical engineer or Organizations: Not on file   Attends Archivist Meetings: Not on file   Marital Status: Not on file     Review of Systems  Constitutional:  Negative for appetite change and unexpected weight change.  HENT:  Negative for congestion and sinus pressure.   Respiratory:  Negative for cough, chest tightness and shortness of breath.   Cardiovascular:  Negative for chest pain, palpitations and leg swelling.  Gastrointestinal:  Negative for abdominal pain, diarrhea, nausea and vomiting.  Genitourinary:  Negative for difficulty urinating and dysuria.  Musculoskeletal:        Arthritis - followed by Dr Jefm Bryant.  Hand issues/pain as outlined.  Knee pain.  Wants to hold on f/u.    Skin:  Negative for color change and rash.  Neurological:  Negative for dizziness, light-headedness and  headaches.  Psychiatric/Behavioral:  Negative for agitation and dysphoric mood.       Objective:     BP 128/70   Pulse 79   Temp 98 F (36.7 C)   Resp 16   Ht 5\' 4"  (1.626 m)   Wt 108 lb 12.8 oz (49.4 kg)   LMP 12/29/1981   SpO2 99%   BMI 18.68 kg/m  Wt Readings from Last 3 Encounters:  10/20/21 108  lb 12.8 oz (49.4 kg)  10/03/21 109 lb 12.8 oz (49.8 kg)  06/11/21 107 lb (48.5 kg)    Physical Exam Vitals reviewed.  Constitutional:      General: She is not in acute distress.    Appearance: Normal appearance.  HENT:     Head: Normocephalic and atraumatic.     Right Ear: External ear normal.     Left Ear: External ear normal.  Eyes:     General: No scleral icterus.       Right eye: No discharge.        Left eye: No discharge.     Conjunctiva/sclera: Conjunctivae normal.  Neck:     Thyroid: No thyromegaly.  Cardiovascular:     Rate and Rhythm: Normal rate and regular rhythm.  Pulmonary:     Effort: No respiratory distress.     Breath sounds: Normal breath sounds. No wheezing.  Abdominal:     General: Bowel sounds are normal.     Palpations: Abdomen is soft.     Tenderness: There is no abdominal tenderness.  Musculoskeletal:        General: No swelling or tenderness.     Cervical back: Neck supple. No tenderness.  Lymphadenopathy:     Cervical: No cervical adenopathy.  Skin:    Findings: No erythema or rash.  Neurological:     Mental Status: She is alert.  Psychiatric:        Mood and Affect: Mood normal.        Behavior: Behavior normal.     Outpatient Encounter Medications as of 10/20/2021  Medication Sig   acetaminophen (TYLENOL) 500 MG tablet Take 1-2 tablets (500-1,000 mg total) by mouth every 6 (six) hours as needed for mild pain.   acyclovir (ZOVIRAX) 200 MG capsule Take 1 capsule (200 mg total) by mouth 2 (two) times daily.   amLODipine (NORVASC) 2.5 MG tablet TAKE 1 TABLET BY MOUTH DAILY   atorvastatin (LIPITOR) 20 MG tablet TAKE 1 TABLET BY  MOUTH DAILY   calcium-vitamin D (OSCAL WITH D) 250-125 MG-UNIT tablet Take 1 tablet by mouth daily.   clopidogrel (PLAVIX) 75 MG tablet TAKE 1 TABLET BY MOUTH DAILY   folic acid (FOLVITE) 1 MG tablet Take 1 mg by mouth daily.   levothyroxine (SYNTHROID) 50 MCG tablet TAKE 1 TABLET BY MOUTH DAILY   methotrexate 2.5 MG tablet Take 10 mg by mouth once a week.   pantoprazole (PROTONIX) 20 MG tablet TAKE 1 TABLET BY MOUTH DAILY   No facility-administered encounter medications on file as of 10/20/2021.     Lab Results  Component Value Date   WBC 5.0 06/09/2021   HGB 12.7 06/09/2021   HCT 39.3 06/09/2021   PLT 237.0 06/09/2021   GLUCOSE 83 10/17/2021   CHOL 147 10/17/2021   TRIG 58.0 10/17/2021   HDL 68.50 10/17/2021   LDLDIRECT 128.0 05/25/2013   LDLCALC 67 10/17/2021   ALT 16 10/17/2021   AST 20 10/17/2021   NA 142 10/17/2021   K 4.2 10/17/2021   CL 108 10/17/2021   CREATININE 0.61 10/17/2021   BUN 20 10/17/2021   CO2 29 10/17/2021   TSH 3.84 01/22/2021   HGBA1C 5.7 (H) 04/25/2017    MR Brain W Wo Contrast  Result Date: 04/25/2021 CLINICAL DATA:  Dizziness, history of stroke with left-sided weakness EXAM: MRI HEAD WITHOUT AND WITH CONTRAST TECHNIQUE: Multiplanar, multiecho pulse sequences of the brain and surrounding structures were obtained without and with intravenous contrast. CONTRAST:  51mL GADAVIST GADOBUTROL 1 MMOL/ML IV SOLN COMPARISON:  April 2018 FINDINGS: Brain: There is no acute infarction or intracranial hemorrhage. There is no intracranial mass, mass effect, or edema. There is no hydrocephalus or extra-axial fluid collection. Ventricles and sulci are within normal limits in size and configuration. There is a chronic infarct of the right basal ganglia and adjacent white matter. There is some wallerian degeneration along the right cortical spinal tract. Additional patchy foci of T2 hyperintensity in the supratentorial white matter nonspecific but may reflect mild chronic  microvascular ischemic changes. Vascular: Major vessel flow voids at the skull base are preserved. Skull and upper cervical spine: Normal marrow signal is preserved. Sinuses/Orbits: Paranasal sinuses are aerated. No significant orbital finding. Other: Sella is unremarkable.  Mastoid air cells are clear. IMPRESSION: No evidence of recent infarction, hemorrhage, or mass. No abnormal enhancement. Chronic infarct of right basal ganglia and adjacent white matter. Electronically Signed   By: Macy Mis M.D.   On: 04/25/2021 15:08       Assessment & Plan:   Problem List Items Addressed This Visit     B12 deficiency    B12 injections.        Essential hypertension    Continue amlodipine.  Blood pressure as outlined.  Follow pressures.  Follow metabolic panel.       Flaccid monoplegia of upper extremity (HCC)    Still some limitations.  Continue exercise.  Follow.       GERD (gastroesophageal reflux disease)    Continue protonix.  No acid reflux reported.  Follow.       History of CVA (cerebrovascular accident)    Continue plavix and lipitor.  Residual effects as outlined.  Discussed f/u with neurology.       Relevant Orders   Ambulatory referral to Neurology   Hypercholesterolemia    Continue lipitor.  Follow lipid panel and liver function tests.        Hypothyroidism, unspecified    On thyroid replacement.  Follow tsh.       Mild depression    Discussed increased stress.  Doing some better as outlined.  Desires no further intervention at this time.  Follow.       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)    On MTX.  Seeing Dr Jefm Bryant. Stable.       Relevant Medications   methotrexate 2.5 MG tablet   Right knee pain    Persistent.  Wants to hold on any further intervention.  Follow.       Skin lesion    Skin lesion - under right eye.  Refer to dermatology.       Relevant Orders   Ambulatory referral to Dermatology     Einar Pheasant, MD

## 2021-10-21 ENCOUNTER — Ambulatory Visit
Admission: RE | Admit: 2021-10-21 | Discharge: 2021-10-21 | Disposition: A | Discharge status: Home / Self Care | Payer: PPO | Source: Ambulatory Visit | Attending: Internal Medicine | Admitting: Internal Medicine

## 2021-10-21 DIAGNOSIS — Z1231 Encounter for screening mammogram for malignant neoplasm of breast: Secondary | ICD-10-CM | POA: Insufficient documentation

## 2021-10-28 ENCOUNTER — Encounter: Payer: PPO | Admitting: Internal Medicine

## 2021-11-01 ENCOUNTER — Encounter: Payer: Self-pay | Admitting: Internal Medicine

## 2021-11-01 DIAGNOSIS — L989 Disorder of the skin and subcutaneous tissue, unspecified: Secondary | ICD-10-CM | POA: Insufficient documentation

## 2021-11-01 NOTE — Assessment & Plan Note (Signed)
Continue plavix and lipitor.  Residual effects as outlined.  Discussed f/u with neurology.

## 2021-11-01 NOTE — Assessment & Plan Note (Signed)
Still some limitations.  Continue exercise.  Follow.  

## 2021-11-01 NOTE — Assessment & Plan Note (Signed)
Continue amlodipine.  Blood pressure as outlined.  Follow pressures.  Follow metabolic panel.  

## 2021-11-01 NOTE — Assessment & Plan Note (Signed)
Continue protonix.  No acid reflux reported.  Follow.  

## 2021-11-01 NOTE — Assessment & Plan Note (Signed)
Persistent.  Wants to hold on any further intervention.  Follow.

## 2021-11-01 NOTE — Assessment & Plan Note (Signed)
Skin lesion - under right eye.  Refer to dermatology.

## 2021-11-01 NOTE — Assessment & Plan Note (Signed)
On thyroid replacement.  Follow tsh.  

## 2021-11-01 NOTE — Assessment & Plan Note (Signed)
Continue lipitor.  Follow lipid panel and liver function tests.   

## 2021-11-01 NOTE — Assessment & Plan Note (Signed)
Discussed increased stress.  Doing some better as outlined.  Desires no further intervention at this time.  Follow.

## 2021-11-01 NOTE — Assessment & Plan Note (Signed)
On MTX.  Seeing Dr Jefm Bryant. Stable.

## 2021-11-01 NOTE — Assessment & Plan Note (Signed)
B12 injections 

## 2021-11-06 ENCOUNTER — Other Ambulatory Visit: Payer: Self-pay

## 2021-11-06 ENCOUNTER — Ambulatory Visit (INDEPENDENT_AMBULATORY_CARE_PROVIDER_SITE_OTHER): Payer: PPO

## 2021-11-06 DIAGNOSIS — R0989 Other specified symptoms and signs involving the circulatory and respiratory systems: Secondary | ICD-10-CM | POA: Diagnosis not present

## 2021-11-06 DIAGNOSIS — I639 Cerebral infarction, unspecified: Secondary | ICD-10-CM

## 2021-11-10 ENCOUNTER — Telehealth: Payer: Self-pay | Admitting: Internal Medicine

## 2021-11-10 NOTE — Telephone Encounter (Signed)
Tried to return call to  advise patient referral to dermatology has been placed as requested , left message  to call office.

## 2021-11-10 NOTE — Telephone Encounter (Signed)
Patient

## 2021-11-10 NOTE — Telephone Encounter (Signed)
Patient is requesting a referral to [facility] for the re-growth under the eye. Patient spoke to Dr Nicki Reaper about this issue at last appointment in October.

## 2021-11-17 ENCOUNTER — Other Ambulatory Visit: Payer: Self-pay

## 2021-11-17 ENCOUNTER — Ambulatory Visit (INDEPENDENT_AMBULATORY_CARE_PROVIDER_SITE_OTHER): Payer: PPO | Admitting: Adult Health

## 2021-11-17 ENCOUNTER — Telehealth: Payer: Self-pay | Admitting: Internal Medicine

## 2021-11-17 ENCOUNTER — Encounter: Payer: Self-pay | Admitting: Adult Health

## 2021-11-17 VITALS — BP 128/74 | HR 68 | Temp 96.2°F | Ht 64.02 in | Wt 109.2 lb

## 2021-11-17 DIAGNOSIS — R42 Dizziness and giddiness: Secondary | ICD-10-CM

## 2021-11-17 DIAGNOSIS — Z8673 Personal history of transient ischemic attack (TIA), and cerebral infarction without residual deficits: Secondary | ICD-10-CM | POA: Diagnosis not present

## 2021-11-17 DIAGNOSIS — I69398 Other sequelae of cerebral infarction: Secondary | ICD-10-CM | POA: Diagnosis not present

## 2021-11-17 DIAGNOSIS — R269 Unspecified abnormalities of gait and mobility: Secondary | ICD-10-CM

## 2021-11-17 LAB — CBC WITH DIFFERENTIAL/PLATELET
Basophils Absolute: 0.1 10*3/uL (ref 0.0–0.1)
Basophils Relative: 1 % (ref 0.0–3.0)
Eosinophils Absolute: 0.2 10*3/uL (ref 0.0–0.7)
Eosinophils Relative: 3 % (ref 0.0–5.0)
HCT: 37.4 % (ref 36.0–46.0)
Hemoglobin: 12.2 g/dL (ref 12.0–15.0)
Lymphocytes Relative: 22 % (ref 12.0–46.0)
Lymphs Abs: 1.5 10*3/uL (ref 0.7–4.0)
MCHC: 32.5 g/dL (ref 30.0–36.0)
MCV: 93.2 fl (ref 78.0–100.0)
Monocytes Absolute: 0.7 10*3/uL (ref 0.1–1.0)
Monocytes Relative: 9.5 % (ref 3.0–12.0)
Neutro Abs: 4.5 10*3/uL (ref 1.4–7.7)
Neutrophils Relative %: 64.5 % (ref 43.0–77.0)
Platelets: 272 10*3/uL (ref 150.0–400.0)
RBC: 4.01 Mil/uL (ref 3.87–5.11)
RDW: 14.4 % (ref 11.5–15.5)
WBC: 6.9 10*3/uL (ref 4.0–10.5)

## 2021-11-17 LAB — BASIC METABOLIC PANEL
BUN: 16 mg/dL (ref 6–23)
CO2: 26 mEq/L (ref 19–32)
Calcium: 9.5 mg/dL (ref 8.4–10.5)
Chloride: 105 mEq/L (ref 96–112)
Creatinine, Ser: 0.68 mg/dL (ref 0.40–1.20)
GFR: 84.84 mL/min (ref >60.00)
Glucose, Bld: 91 mg/dL (ref 70–99)
Potassium: 3.9 mEq/L (ref 3.5–5.1)
Sodium: 139 mEq/L (ref 135–145)

## 2021-11-17 LAB — TSH: TSH: 2.89 u[IU]/mL (ref 0.35–5.50)

## 2021-11-17 NOTE — Telephone Encounter (Signed)
Pt called in regards to symptoms of dizziness. Pt called nurse line and was dropped back to [name]. Pt was feeling dizzy Saturday and was okay on Sunday. Pt is beginning to feel these symptoms again and wants to have labs done. Pt scheduled with [facility] today 11/21 at 11:30

## 2021-11-17 NOTE — Telephone Encounter (Signed)
Pt called in stating that someone called her phone and didn't leave a message. Pt was wondering if someone was calling about her lab results. Pt would like callback.

## 2021-11-17 NOTE — Progress Notes (Signed)
CBC, CMP, TSH within normal limits.  Advise patient to follow up if any symptoms persist, change or worsen at anytime. We discussed red flag symptoms at office visit and patient verbalized understanding.  She was asymptomatic at office visit today.

## 2021-11-17 NOTE — Progress Notes (Signed)
Acute Office Visit  Subjective:    Patient ID: Donna Rhodes, female    DOB: 1946/03/14, 75 y.o.   MRN: [id number]  Chief Complaint  Patient presents with   Dizziness    Dizziness Associated symptoms include numbness (chronic left sided residual from stroke) and weakness (chronic residual from previous stroke.). Pertinent negatives include no headaches.  Patient is in today for  episode of " swimmy headed" feeling  on 11/15/2021 that occurred and lasted around 6 hours with complete resolution. She felt like she has just gotten off of a roller coaster. She denies any ongoing symptoms.  She has appointment with Dr. Manuella Ghazi in December. She was seeing neurology in [place].  She has no symptoms today she reports. Denies any headache or tinnitus.  Denies any falls. Denies any visual defects, speech changes or balance coordination issues other than her left sided paralysis and weakness from her past stroke  2018.  Denies any head trauma.  She seen ENT in past, was told not vertigo, had hearing test and did not follow up.   History of CVA 2018.   Patient  denies any fever, body aches,chills, rash, chest pain, shortness of breath, nausea, vomiting, or diarrhea.     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  PT 2x/wk.   Lung nodule    stable   Menopausal state    Osteoarthritis    hand involvement, cervical disc disease   Osteoporosis    Pelvic pain in female    Rheumatoid arthritis ([place])    Stroke (cerebrum) ([place]) 04/24/2017   Thyroid nodule    pt unaware of this    Vaginal Pap smear, abnormal    lgsil    Past  Surgical History:  Procedure Laterality Date   [place]   left   CATARACT EXTRACTION W/PHACO Right 10/27/2017   Procedure: CATARACT EXTRACTION PHACO AND INTRAOCULAR LENS PLACEMENT ([place]) RIGHT;  Surgeon: Leandrew Koyanagi, MD;  Location: [facility];  Service: Ophthalmology;  Laterality: Right;  requests early (8-8:30 arrival)   CATARACT EXTRACTION W/PHACO Left 11/10/2017   Procedure: CATARACT EXTRACTION PHACO AND INTRAOCULAR LENS PLACEMENT ([place]) LEFT;  Surgeon: Leandrew Koyanagi, MD;  Location: [facility];  Service: Ophthalmology;  Laterality: Left;   CHOLECYSTECTOMY     FINGER SURGERY  1982   laser vein surgery     LOOP RECORDER INSERTION N/A 09/24/2017   Procedure: LOOP RECORDER INSERTION;  Surgeon: Evans Lance, MD;  Location: [facility];  Service: Cardiovascular;  Laterality: N/A;   MANDIBLE RECONSTRUCTION  1992   TOENAIL EXCISION  1998   several   TONSILLECTOMY     TUBAL LIGATION  1977   VAGINAL HYSTERECTOMY  1983   als0- anterior/posterior colporrhaphy   VAGINAL WOUND CLOSURE / [place], 2000   VAGINAL WOUND CLOSURE / REPAIR      Family History  Problem Relation Age of Onset   Hypertension Father    Dementia Father  Osteoarthritis Mother    Arthritis/Rheumatoid Sister    Breast cancer Other        paternal side   Breast cancer Paternal Aunt    Breast cancer Cousin        maternal   Uterine cancer Maternal Grandmother    Ovarian cancer Neg Hx    Heart disease Neg Hx    Diabetes Neg Hx    Colon cancer Neg Hx     Social History   Socioeconomic History   Marital status: Divorced    Spouse name: Not on file   Number of children: 3   Years of education: 12   Highest education level: Not on file  Occupational History    Comment: retired  Tobacco Use   Smoking status: Former    Types: Cigarettes    Quit date: 12/29/1979    Years since quitting: 41.9   Smokeless tobacco: Never   Vaping Use   Vaping Use: Never used  Substance and Sexual Activity   Alcohol use: No    Alcohol/week: 0.0 standard drinks   Drug use: No   Sexual activity: Not Currently    Birth control/protection: Surgical  Other Topics Concern   Not on file  Social History Narrative   Her mother and her daughter live with her   Caffeine- 1 cup coffee   Social Determinants of Health   Financial Resource Strain: Low Risk    Difficulty of Paying Living Expenses: Not hard at all  Food Insecurity: No Food Insecurity   Worried About Charity fundraiser in the Last Year: Never true   Ran Out of Food in the Last Year: Never true  Transportation Needs: No Transportation Needs   Lack of Transportation (Medical): No   Lack of Transportation (Non-Medical): No  Physical Activity: Not on file  Stress: No Stress Concern Present   Feeling of Stress : Only a little  Social Connections: Unknown   Frequency of Communication with Friends and Family: More than three times a week   Frequency of Social Gatherings with Friends and Family: More than three times a week   Attends Religious Services: Not on Electrical engineer or Organizations: Not on file   Attends Archivist Meetings: Not on file   Marital Status: Not on file  Intimate Partner Violence: Not At Risk   Fear of Current or Ex-Partner: No   Emotionally Abused: No   Physically Abused: No   Sexually Abused: No    Outpatient Medications Prior to Visit  Medication Sig Dispense Refill   acetaminophen (TYLENOL) 500 MG tablet Take 1-2 tablets (500-1,000 mg total) by mouth every 6 (six) hours as needed for mild pain. 30 tablet 0   acyclovir (ZOVIRAX) 200 MG capsule Take 1 capsule (200 mg total) by mouth 2 (two) times daily. 180 capsule 3   amLODipine (NORVASC) 2.5 MG tablet TAKE 1 TABLET BY MOUTH DAILY 90 tablet 1   atorvastatin (LIPITOR) 20 MG tablet TAKE 1 TABLET BY MOUTH DAILY 90 tablet 1   calcium-vitamin D (OSCAL WITH D) 250-125  MG-UNIT tablet Take 1 tablet by mouth daily.     clopidogrel (PLAVIX) 75 MG tablet TAKE 1 TABLET BY MOUTH DAILY 90 tablet 1   folic acid (FOLVITE) 1 MG tablet Take 1 mg by mouth daily.     levothyroxine (SYNTHROID) 50 MCG tablet TAKE 1 TABLET BY MOUTH DAILY 90 tablet 0   methotrexate 2.5 MG tablet Take 10 mg by  mouth once a week.     pantoprazole (PROTONIX) 20 MG tablet TAKE 1 TABLET BY MOUTH DAILY 90 tablet 0   No facility-administered medications prior to visit.    Allergies  Allergen Reactions   Amoxicillin Other (See Comments)    Has patient had a PCN reaction causing immediate rash, facial/tongue/throat swelling, SOB or lightheadedness with hypotension: No Has patient had a PCN reaction causing severe rash involving mucus membranes or skin necrosis: No Has patient had a PCN reaction that required hospitalization: No Has patient had a PCN reaction occurring within the last 10 years: No If all of the above answers are "NO", then may proceed with Cephalosporin use.   Irritated tongue   Avelox [Moxifloxacin Hcl In Nacl]     Tongue irritation   Azithromycin Other (See Comments)    Caused mouth and tongue to be sore   Bacitracin     Itching and rash    Codeine     "feels like I'm in a barrel"   [employer] [Nutritional Supplements]     Mouth soreness   Fish Oil     Other reaction(s): Other (See Comments) Mouth soreness Mouth soreness   Food     Walnuts --mouth breaks out   Moxifloxacin Other (See Comments)    Tongue irritation   Naproxen     Tongue irritation   Nsaids Other (See Comments)    Oral inflammation   Cefepime Rash    Review of Systems  Constitutional: Negative.   HENT: Negative.    Respiratory: Negative.    Cardiovascular: Negative.   Gastrointestinal: Negative.   Genitourinary: Negative.   Musculoskeletal: Negative.   Neurological:  Positive for dizziness, weakness (chronic residual from previous stroke.) and numbness (chronic left sided  residual from stroke). Negative for tremors, seizures, syncope, facial asymmetry, speech difficulty, light-headedness and headaches.      Objective:    Physical Exam Vitals reviewed.  Constitutional:      General: She is not in acute distress.    Appearance: She is not ill-appearing, toxic-appearing or diaphoretic.     Comments: Left sided paralysis.   HENT:     Head: Normocephalic and atraumatic.     Right Ear: Tympanic membrane, ear canal and external ear normal. There is no impacted cerumen.     Left Ear: Tympanic membrane, ear canal and external ear normal. There is no impacted cerumen.     Nose: Nose normal. No congestion or rhinorrhea.     Mouth/Throat:     Mouth: Mucous membranes are moist.     Pharynx: No oropharyngeal exudate or posterior oropharyngeal erythema.  Eyes:     General: No scleral icterus.       Right eye: No discharge.        Left eye: No discharge.     Conjunctiva/sclera: Conjunctivae normal.     Pupils: Pupils are equal, round, and reactive to light.  Neck:     Vascular: No carotid bruit.  Cardiovascular:     Rate and Rhythm: Normal rate and regular rhythm.     Pulses: Normal pulses.     Heart sounds: Normal heart sounds. No murmur heard.   No friction rub. No gallop.  Pulmonary:     Effort: Pulmonary effort is normal. No respiratory distress.     Breath sounds: Normal breath sounds. No stridor. No wheezing, rhonchi or rales.  Chest:     Chest wall: No tenderness.  Abdominal:     General: There is  no distension.     Palpations: Abdomen is soft.     Tenderness: There is no abdominal tenderness. There is no guarding.  Musculoskeletal:        General: Deformity (left arm chronic paralysis post stroke left sided weakness chronic) present. No swelling, tenderness or signs of injury.     Cervical back: Neck supple. No rigidity or tenderness.     Right lower leg: No edema.     Left lower leg: No edema.  Lymphadenopathy:     Cervical: No cervical  adenopathy.  Skin:    General: Skin is warm.     Findings: No erythema or rash.  Neurological:     Mental Status: She is alert and oriented to person, place, and time. Mental status is at baseline.     Motor: Weakness (left sided paralysis left upper extremity and weakness left side compared to right, residual since stroke patient denies any change.) present.     Gait: Gait abnormal (walks slow and guarded since stroke 2018 denies change.).  Psychiatric:        Mood and Affect: Mood normal.        Behavior: Behavior normal.        Thought Content: Thought content normal.        Judgment: Judgment normal.    BP 128/74   Pulse 68   Temp (!) 96.2 F (35.7 C)   Ht 5' 4.02" (1.626 m)   Wt 109 lb 3.2 oz (49.5 kg)   LMP 12/29/1981   SpO2 97%   BMI 18.73 kg/m  Wt Readings from Last 3 Encounters:  11/17/21 109 lb 3.2 oz (49.5 kg)  10/20/21 108 lb 12.8 oz (49.4 kg)  10/03/21 109 lb 12.8 oz (49.8 kg)    Health Maintenance Due  Topic Date Due   Zoster Vaccines- Shingrix (1 of 2) Never done   COVID-19 Vaccine (4 - Booster for Moderna series) 02/11/2021    There are no preventive care reminders to display for this patient.   Lab Results  Component Value Date   TSH 3.84 01/22/2021   Lab Results  Component Value Date   WBC 5.0 06/09/2021   HGB 12.7 06/09/2021   HCT 39.3 06/09/2021   MCV 92.0 06/09/2021   PLT 237.0 06/09/2021   Lab Results  Component Value Date   NA 142 10/17/2021   K 4.2 10/17/2021   CO2 29 10/17/2021   GLUCOSE 83 10/17/2021   BUN 20 10/17/2021   CREATININE 0.61 10/17/2021   BILITOT 0.5 10/17/2021   ALKPHOS 88 10/17/2021   AST 20 10/17/2021   ALT 16 10/17/2021   PROT 6.6 10/17/2021   ALBUMIN 4.0 10/17/2021   CALCIUM 9.1 10/17/2021   ANIONGAP 8 05/18/2017   GFR 87.14 10/17/2021   Lab Results  Component Value Date   CHOL 147 10/17/2021   Lab Results  Component Value Date   HDL 68.50 10/17/2021   Lab Results  Component Value Date   LDLCALC  67 10/17/2021   Lab Results  Component Value Date   TRIG 58.0 10/17/2021   Lab Results  Component Value Date   CHOLHDL 2 10/17/2021   Lab Results  Component Value Date   HGBA1C 5.7 (H) 04/25/2017       Assessment & Plan:   Problem List Items Addressed This Visit       Other   History of CVA (cerebrovascular accident)   Gait disturbance, post-stroke   Dizziness - Primary   Relevant Orders  Basic metabolic panel   CBC with Differential/Platelet   TSH  Patient declines CT or imaging today, did have MRI 6 months ago. She will go to the ER if any symptoms worsening.  She will keep follow up with Dr. Manuella Ghazi neurology.  She has no reports of any further episodes of dizziness since Saturday.  She feels well today and reports is at baseline.   Red Flags discussed. The patient was given clear instructions to go to ER or return to medical center if any red flags develop, symptoms do not improve, worsen or new problems develop. They verbalized understanding.  Return in about 3 weeks (around 12/08/2021), or if symptoms worsen or fail to improve, for at any time for any worsening symptoms, Go to Emergency room/ urgent care if worse.    No orders of the defined types were placed in this encounter.    Marcille Buffy, FNP

## 2021-11-17 NOTE — Patient Instructions (Signed)
Dizziness Dizziness is a common problem. It is a feeling of unsteadiness or light-headedness. You may feel like you are about to faint. Dizziness can lead to injury if you stumble or fall. Anyone can become dizzy, but dizziness is more common in older adults. This condition can be caused by a number of things, including medicines, dehydration, or illness. Follow these instructions at home: Eating and drinking  Drink enough fluid to keep your urine pale yellow. This helps to keep you from becoming dehydrated. Try to drink more clear fluids, such as water. Do not drink alcohol. Limit your caffeine intake if told to do so by your health care provider. Check ingredients and nutrition facts to see if a food or beverage contains caffeine. Limit your salt (sodium) intake if told to do so by your health care provider. Check ingredients and nutrition facts to see if a food or beverage contains sodium. Activity  Avoid making quick movements. Rise slowly from chairs and steady yourself until you feel okay. In the morning, first sit up on the side of the bed. When you feel okay, stand slowly while you hold onto something until you know that your balance is good. If you need to stand in one place for a long time, move your legs often. Tighten and relax the muscles in your legs while you are standing. Do not drive or use machinery if you feel dizzy. Avoid bending down if you feel dizzy. Place items in your home so that they are easy for you to reach without leaning over. Lifestyle Do not use any products that contain nicotine or tobacco. These products include cigarettes, chewing tobacco, and vaping devices, such as e-cigarettes. If you need help quitting, ask your health care provider. Try to reduce your stress level by using methods such as yoga or meditation. Talk with your health care provider if you need help to manage your stress. General instructions Watch your dizziness for any changes. Take  over-the-counter and prescription medicines only as told by your health care provider. Talk with your health care provider if you think that your dizziness is caused by a medicine that you are taking. Tell a friend or a family member that you are feeling dizzy. If he or she notices any changes in your behavior, have this person call your health care provider. Keep all follow-up visits. This is important. Contact a health care provider if: Your dizziness does not go away or you have new symptoms. Your dizziness or light-headedness gets worse. You feel nauseous. You have reduced hearing. You have a fever. You have neck pain or a stiff neck. Your dizziness leads to an injury or a fall. Get help right away if: You vomit or have diarrhea and are unable to eat or drink anything. You have problems talking, walking, swallowing, or using your arms, hands, or legs. You feel generally weak. You have any bleeding. You are not thinking clearly or you have trouble forming sentences. It may take a friend or family member to notice this. You have chest pain, abdominal pain, shortness of breath, or sweating. Your vision changes or you develop a severe headache. These symptoms may represent a serious problem that is an emergency. Do not wait to see if the symptoms will go away. Get medical help right away. Call your local emergency services (911 in the [place] Do not drive yourself to the hospital. Summary Dizziness is a feeling of unsteadiness or light-headedness. This condition can be caused by a number of   things, including medicines, dehydration, or illness. Anyone can become dizzy, but dizziness is more common in older adults. Drink enough fluid to keep your urine pale yellow. Do not drink alcohol. Avoid making quick movements if you feel dizzy. Monitor your dizziness for any changes. This information is not intended to replace advice given to you by your health care provider. Make sure you discuss any  questions you have with your health care provider. Document Revised: 11/18/2020 Document Reviewed: 11/18/2020 Elsevier Patient Education  2022 [employer]  

## 2021-11-17 NOTE — Telephone Encounter (Signed)
Called patient. Confirmed no dizziness at this time. No other acute symptoms. Felt "swimmy headed" this weekend and also described it as if she had just gotten off of a roller coaster. Patient confirmed that she would not drive if acute symptoms start. Patient would like to keep her appt with Sharyn Lull if she can have some labs done. I have pended lab orders.

## 2021-11-17 NOTE — Telephone Encounter (Signed)
Pt aware dermatology referral was placed. She has an appt in Feb

## 2021-11-18 NOTE — Telephone Encounter (Signed)
See result note.  

## 2021-12-03 ENCOUNTER — Encounter: Payer: PPO | Admitting: Obstetrics and Gynecology

## 2021-12-03 DIAGNOSIS — M0609 Rheumatoid arthritis without rheumatoid factor, multiple sites: Secondary | ICD-10-CM | POA: Diagnosis not present

## 2021-12-03 DIAGNOSIS — I6389 Other cerebral infarction: Secondary | ICD-10-CM | POA: Diagnosis not present

## 2021-12-03 DIAGNOSIS — M159 Polyosteoarthritis, unspecified: Secondary | ICD-10-CM | POA: Diagnosis not present

## 2021-12-08 DIAGNOSIS — S63591A Other specified sprain of right wrist, initial encounter: Secondary | ICD-10-CM | POA: Diagnosis not present

## 2021-12-08 DIAGNOSIS — Z8673 Personal history of transient ischemic attack (TIA), and cerebral infarction without residual deficits: Secondary | ICD-10-CM | POA: Diagnosis not present

## 2021-12-08 DIAGNOSIS — M25531 Pain in right wrist: Secondary | ICD-10-CM | POA: Diagnosis not present

## 2021-12-08 DIAGNOSIS — M65839 Other synovitis and tenosynovitis, unspecified forearm: Secondary | ICD-10-CM | POA: Diagnosis not present

## 2021-12-10 ENCOUNTER — Other Ambulatory Visit: Payer: Self-pay | Admitting: Internal Medicine

## 2021-12-17 ENCOUNTER — Other Ambulatory Visit: Payer: Self-pay | Admitting: Internal Medicine

## 2021-12-19 DIAGNOSIS — R0981 Nasal congestion: Secondary | ICD-10-CM | POA: Diagnosis not present

## 2021-12-19 DIAGNOSIS — Z03818 Encounter for observation for suspected exposure to other biological agents ruled out: Secondary | ICD-10-CM | POA: Diagnosis not present

## 2021-12-19 DIAGNOSIS — N390 Urinary tract infection, site not specified: Secondary | ICD-10-CM | POA: Diagnosis not present

## 2021-12-19 DIAGNOSIS — B9689 Other specified bacterial agents as the cause of diseases classified elsewhere: Secondary | ICD-10-CM | POA: Diagnosis not present

## 2021-12-19 DIAGNOSIS — J209 Acute bronchitis, unspecified: Secondary | ICD-10-CM | POA: Diagnosis not present

## 2021-12-19 DIAGNOSIS — R300 Dysuria: Secondary | ICD-10-CM | POA: Diagnosis not present

## 2021-12-19 DIAGNOSIS — J019 Acute sinusitis, unspecified: Secondary | ICD-10-CM | POA: Diagnosis not present

## 2021-12-19 DIAGNOSIS — A499 Bacterial infection, unspecified: Secondary | ICD-10-CM | POA: Diagnosis not present

## 2021-12-25 DIAGNOSIS — M0609 Rheumatoid arthritis without rheumatoid factor, multiple sites: Secondary | ICD-10-CM | POA: Diagnosis not present

## 2021-12-25 DIAGNOSIS — Z79899 Other long term (current) drug therapy: Secondary | ICD-10-CM | POA: Diagnosis not present

## 2021-12-25 DIAGNOSIS — M159 Polyosteoarthritis, unspecified: Secondary | ICD-10-CM | POA: Diagnosis not present

## 2021-12-25 DIAGNOSIS — I6389 Other cerebral infarction: Secondary | ICD-10-CM | POA: Diagnosis not present

## 2021-12-25 DIAGNOSIS — G8929 Other chronic pain: Secondary | ICD-10-CM | POA: Diagnosis not present

## 2021-12-25 DIAGNOSIS — M25562 Pain in left knee: Secondary | ICD-10-CM | POA: Diagnosis not present

## 2022-01-05 ENCOUNTER — Telehealth: Payer: Self-pay | Admitting: Internal Medicine

## 2022-01-05 NOTE — Telephone Encounter (Signed)
Patient called in stated want UTI test done, when urine can be painful and when wipe up urine would be pink, Please call patient @ [phone]

## 2022-01-05 NOTE — Telephone Encounter (Signed)
Patient decline for regular appt to see a doctor and decline to go to Urgent care

## 2022-01-06 ENCOUNTER — Encounter: Payer: Self-pay | Admitting: Internal Medicine

## 2022-01-06 ENCOUNTER — Ambulatory Visit (INDEPENDENT_AMBULATORY_CARE_PROVIDER_SITE_OTHER): Payer: PPO | Admitting: Internal Medicine

## 2022-01-06 ENCOUNTER — Other Ambulatory Visit: Payer: Self-pay

## 2022-01-06 VITALS — BP 138/70 | HR 68 | Temp 97.5°F | Ht 64.02 in | Wt 108.6 lb

## 2022-01-06 DIAGNOSIS — N3000 Acute cystitis without hematuria: Secondary | ICD-10-CM | POA: Diagnosis not present

## 2022-01-06 MED ORDER — NITROFURANTOIN MONOHYD MACRO 100 MG PO CAPS: 100.0000 mg | ORAL_CAPSULE | Freq: Two times a day (BID) | ORAL | 0 refills | Status: DC

## 2022-01-06 NOTE — Telephone Encounter (Signed)
Called patient to let her know that I was out of office when she called and she should keep appt with Dr Aundra Dubin today since she has been having sx since last Thursday.

## 2022-01-06 NOTE — Telephone Encounter (Signed)
Patient called this morning wondering why no one called her. Appointment was made for 01/06/22 at 1pm with Dr Aundra Dubin.

## 2022-01-06 NOTE — Progress Notes (Signed)
Chief Complaint  Patient presents with   Urinary Tract Infection   F/u  Seen UC 2 weeks ago and had E coli uti c/o pain with urination light pink with wiping and small blood ct on pad since 12/25 tried azo otc pain at times 7/10  Seen uc bronchitis and sxs better resp. Given doxycycline but urinary sxs remain  Review of Systems  Constitutional:  Negative for weight loss.  HENT:  Negative for hearing loss.   Eyes:  Negative for blurred vision.  Respiratory:  Negative for shortness of breath.   Cardiovascular:  Negative for chest pain.  Gastrointestinal:  Negative for abdominal pain and blood in stool.  Genitourinary:  Positive for dysuria.  Musculoskeletal:  Negative for falls and joint pain.  Skin:  Negative for rash.  Neurological:  Negative for headaches.  Psychiatric/Behavioral:  Negative for depression.   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  PT 2x/wk.   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   Thyroid nodule    pt unaware of this    Vaginal Pap smear, abnormal    lgsil   Past Surgical History:  Procedure Laterality Date   [place]   left   CATARACT EXTRACTION W/PHACO Right 10/27/2017   Procedure: CATARACT EXTRACTION PHACO AND INTRAOCULAR LENS PLACEMENT ([place]) RIGHT;  Surgeon: Leandrew Koyanagi, MD;  Location: [facility];  Service: Ophthalmology;  Laterality: Right;  requests  early (8-8:30 arrival)   CATARACT EXTRACTION W/PHACO Left 11/10/2017   Procedure: CATARACT EXTRACTION PHACO AND INTRAOCULAR LENS PLACEMENT ([place]) LEFT;  Surgeon: Leandrew Koyanagi, MD;  Location: [facility];  Service: Ophthalmology;  Laterality: Left;   CHOLECYSTECTOMY     FINGER SURGERY  1982   laser vein surgery     LOOP RECORDER INSERTION N/A 09/24/2017   Procedure: LOOP RECORDER INSERTION;  Surgeon: Evans Lance, MD;  Location: [facility];  Service: Cardiovascular;  Laterality: N/A;   MANDIBLE RECONSTRUCTION  1992   TOENAIL EXCISION  1998   several   TONSILLECTOMY     TUBAL LIGATION  1977   VAGINAL HYSTERECTOMY  1983   als0- anterior/posterior colporrhaphy   VAGINAL WOUND CLOSURE / REPAIR  1999, 2000   VAGINAL WOUND CLOSURE / REPAIR     Family History  Problem Relation Age of Onset   Hypertension Father    Dementia Father    Osteoarthritis Mother    Arthritis/Rheumatoid Sister    Breast cancer Other        paternal side   Breast cancer Paternal Aunt    Breast cancer Cousin        maternal   Uterine cancer Maternal Grandmother    Ovarian cancer Neg Hx    Heart disease Neg Hx  Diabetes Neg Hx    Colon cancer Neg Hx    Social History   Socioeconomic History   Marital status: Divorced    Spouse name: Not on file   Number of children: 3   Years of education: 12   Highest education level: Not on file  Occupational History    Comment: retired  Tobacco Use   Smoking status: Former    Types: Cigarettes    Quit date: 12/29/1979    Years since quitting: 42.0   Smokeless tobacco: Never  Vaping Use   Vaping Use: Never used  Substance and Sexual Activity   Alcohol use: No    Alcohol/week: 0.0 standard drinks   Drug use: No   Sexual activity: Not Currently    Birth control/protection: Surgical  Other Topics Concern   Not on file  Social History Narrative   Her mother and her daughter live with her   Caffeine- 1 cup coffee   Social  Determinants of Health   Financial Resource Strain: Low Risk    Difficulty of Paying Living Expenses: Not hard at all  Food Insecurity: No Food Insecurity   Worried About Charity fundraiser in the Last Year: Never true   Ran Out of Food in the Last Year: Never true  Transportation Needs: No Transportation Needs   Lack of Transportation (Medical): No   Lack of Transportation (Non-Medical): No  Physical Activity: Not on file  Stress: No Stress Concern Present   Feeling of Stress : Only a little  Social Connections: Unknown   Frequency of Communication with Friends and Family: More than three times a week   Frequency of Social Gatherings with Friends and Family: More than three times a week   Attends Religious Services: Not on Electrical engineer or Organizations: Not on file   Attends Archivist Meetings: Not on file   Marital Status: Not on file  Intimate Partner Violence: Not At Risk   Fear of Current or Ex-Partner: No   Emotionally Abused: No   Physically Abused: No   Sexually Abused: No   Current Meds  Medication Sig   acetaminophen (TYLENOL) 500 MG tablet Take 1-2 tablets (500-1,000 mg total) by mouth every 6 (six) hours as needed for mild pain.   acyclovir (ZOVIRAX) 200 MG capsule Take 1 capsule (200 mg total) by mouth 2 (two) times daily.   amLODipine (NORVASC) 2.5 MG tablet TAKE 1 TABLET BY MOUTH DAILY   atorvastatin (LIPITOR) 20 MG tablet TAKE 1 TABLET BY MOUTH DAILY   clopidogrel (PLAVIX) 75 MG tablet TAKE 1 TABLET BY MOUTH DAILY   folic acid (FOLVITE) 1 MG tablet Take 1 mg by mouth daily.   levothyroxine (SYNTHROID) 50 MCG tablet TAKE 1 TABLET BY MOUTH DAILY   methotrexate 2.5 MG tablet Take 10 mg by mouth once a week.   nitrofurantoin, macrocrystal-monohydrate, (MACROBID) 100 MG capsule Take 1 capsule (100 mg total) by mouth 2 (two) times daily. X 5 days   pantoprazole (PROTONIX) 20 MG tablet TAKE 1 TABLET BY MOUTH DAILY   Allergies  Allergen  Reactions   Amoxicillin Other (See Comments)    Has patient had a PCN reaction causing immediate rash, facial/tongue/throat swelling, SOB or lightheadedness with hypotension: No Has patient had a PCN reaction causing severe rash involving mucus membranes or skin necrosis: No Has patient had a PCN reaction that required hospitalization: No Has patient had a PCN reaction occurring within the last 10 years:  No If all of the above answers are "NO", then may proceed with Cephalosporin use.   Irritated tongue   Avelox [Moxifloxacin Hcl In Nacl]     Tongue irritation   Azithromycin Other (See Comments)    Caused mouth and tongue to be sore   Bacitracin     Itching and rash    Codeine     "feels like I'm in a barrel"   [place] [facility] [Nutritional Supplements]     Mouth soreness   Fish Oil     Other reaction(s): Other (See Comments) Mouth soreness Mouth soreness   Food     Walnuts --mouth breaks out   Moxifloxacin Other (See Comments)    Tongue irritation   Naproxen     Tongue irritation   Nsaids Other (See Comments)    Oral inflammation   Cefepime Rash   Recent Results (from the past 2160 hour(s))  Lipid panel     Status: None   Collection Time: 10/17/21  9:10 AM  Result Value Ref Range   Cholesterol 147 0 - 200 mg/dL    Comment: ATP III Classification       Desirable:  < 200 mg/dL               Borderline High:  200 - 239 mg/dL          High:  > = 240 mg/dL   Triglycerides 58.0 0.0 - 149.0 mg/dL    Comment: Normal:  <150 mg/dLBorderline High:  150 - 199 mg/dL   HDL 68.50 >39.00 mg/dL   VLDL 11.6 0.0 - 40.0 mg/dL   LDL Cholesterol 67 0 - 99 mg/dL   Total CHOL/HDL Ratio 2     Comment:                Men          Women1/2 Average Risk     3.4          3.3Average Risk          5.0          4.42X Average Risk          9.6          7.13X Average Risk          15.0          11.0                       NonHDL 78.92     Comment: NOTE:  Non-HDL goal should be 30 mg/dL higher  than patient's LDL goal (i.e. LDL goal of < 70 mg/dL, would have non-HDL goal of < 100 mg/dL)  Hepatic function panel     Status: None   Collection Time: 10/17/21  9:10 AM  Result Value Ref Range   Total Bilirubin 0.5 0.2 - 1.2 mg/dL   Bilirubin, Direct 0.1 0.0 - 0.3 mg/dL   Alkaline Phosphatase 88 39 - 117 U/L   AST 20 0 - 37 U/L   ALT 16 0 - 35 U/L   Total Protein 6.6 6.0 - 8.3 g/dL   Albumin 4.0 3.5 - 5.2 g/dL  Basic metabolic panel     Status: None   Collection Time: 10/17/21  9:10 AM  Result Value Ref Range   Sodium 142 135 - 145 mEq/L   Potassium 4.2 3.5 - 5.1 mEq/L   Chloride 108 96 - 112 mEq/L   CO2 29 19 - 32  mEq/L   Glucose, Bld 83 70 - 99 mg/dL   BUN 20 6 - 23 mg/dL   Creatinine, Ser 0.61 0.40 - 1.20 mg/dL   GFR 87.14 >60.00 mL/min    Comment: Calculated using the CKD-EPI Creatinine Equation (2021)   Calcium 9.1 8.4 - 10.5 mg/dL  Basic metabolic panel     Status: None   Collection Time: 11/17/21 11:53 AM  Result Value Ref Range   Sodium 139 135 - 145 mEq/L   Potassium 3.9 3.5 - 5.1 mEq/L   Chloride 105 96 - 112 mEq/L   CO2 26 19 - 32 mEq/L   Glucose, Bld 91 70 - 99 mg/dL   BUN 16 6 - 23 mg/dL   Creatinine, Ser 0.68 0.40 - 1.20 mg/dL   GFR 84.84 >60.00 mL/min    Comment: Calculated using the CKD-EPI Creatinine Equation (2021)   Calcium 9.5 8.4 - 10.5 mg/dL  CBC with Differential/Platelet     Status: None   Collection Time: 11/17/21 11:53 AM  Result Value Ref Range   WBC 6.9 4.0 - 10.5 K/uL   RBC 4.01 3.87 - 5.11 Mil/uL   Hemoglobin 12.2 12.0 - 15.0 g/dL   HCT 37.4 36.0 - 46.0 %   MCV 93.2 78.0 - 100.0 fl   MCHC 32.5 30.0 - 36.0 g/dL   RDW 14.4 11.5 - 15.5 %   Platelets 272.0 150.0 - 400.0 K/uL   Neutrophils Relative % 64.5 43.0 - 77.0 %   Lymphocytes Relative 22.0 12.0 - 46.0 %   Monocytes Relative 9.5 3.0 - 12.0 %   Eosinophils Relative 3.0 0.0 - 5.0 %   Basophils Relative 1.0 0.0 - 3.0 %   Neutro Abs 4.5 1.4 - 7.7 K/uL   Lymphs Abs 1.5 0.7 - 4.0 K/uL    Monocytes Absolute 0.7 0.1 - 1.0 K/uL   Eosinophils Absolute 0.2 0.0 - 0.7 K/uL   Basophils Absolute 0.1 0.0 - 0.1 K/uL  TSH     Status: None   Collection Time: 11/17/21 11:53 AM  Result Value Ref Range   TSH 2.89 0.35 - 5.50 uIU/mL   Objective  Body mass index is 18.63 kg/m. Wt Readings from Last 3 Encounters:  01/06/22 108 lb 9.6 oz (49.3 kg)  11/17/21 109 lb 3.2 oz (49.5 kg)  10/20/21 108 lb 12.8 oz (49.4 kg)   Temp Readings from Last 3 Encounters:  01/06/22 (!) 97.5 F (36.4 C) (Temporal)  11/17/21 (!) 96.2 F (35.7 C)  10/20/21 98 F (36.7 C)   BP Readings from Last 3 Encounters:  01/06/22 138/70  11/17/21 128/74  10/20/21 128/70   Pulse Readings from Last 3 Encounters:  01/06/22 68  11/17/21 68  10/20/21 79    Physical Exam Vitals and nursing note reviewed.  Constitutional:      Appearance: Normal appearance. She is well-developed and well-groomed.  HENT:     Head: Normocephalic and atraumatic.  Eyes:     Conjunctiva/sclera: Conjunctivae normal.     Pupils: Pupils are equal, round, and reactive to light.  Cardiovascular:     Rate and Rhythm: Normal rate and regular rhythm.     Heart sounds: Normal heart sounds. No murmur heard. Pulmonary:     Effort: Pulmonary effort is normal.     Breath sounds: Normal breath sounds.  Abdominal:     General: Abdomen is flat. Bowel sounds are normal.     Tenderness: There is no abdominal tenderness.  Musculoskeletal:  General: No tenderness.  Skin:    General: Skin is warm and dry.  Neurological:     General: No focal deficit present.     Mental Status: She is alert and oriented to person, place, and time. Mental status is at baseline.     Cranial Nerves: Cranial nerves 2-12 are intact.     Gait: Gait is intact.  Psychiatric:        Attention and Perception: Attention and perception normal.        Mood and Affect: Mood and affect normal.        Speech: Speech normal.        Behavior: Behavior normal.  Behavior is cooperative.        Thought Content: Thought content normal.        Cognition and Memory: Cognition and memory normal.        Judgment: Judgment normal.    Assessment  Plan  Acute cystitis without hematuria - Plan: Urinalysis w microscopic + reflex cultur, nitrofurantoin, macrocrystal-monohydrate, (MACROBID) 100 MG capsule bid x 5 days  F/u PCP as sch  Try cranberry supplements and D mannose supplements  Increase water intake  Provider: Dr. Olivia Mackie McLean-Scoc[facility]

## 2022-01-06 NOTE — Patient Instructions (Addendum)
Try cranberry supplements and D mannose supplements  Increase water intake    Zoster Vaccine, Recombinant injection What is this medication? ZOSTER VACCINE (ZOS ter vak SEEN) is a vaccine used to reduce the risk of getting shingles. This vaccine is not used to treat shingles or nerve pain from shingles. This medicine may be used for other purposes; ask your health care provider or pharmacist if you have questions. COMMON BRAND NAME(S): [facility] What should I tell my care team before I take this medication? They need to know if you have any of these conditions: cancer immune system problems an unusual or allergic reaction to Zoster vaccine, other medications, foods, dyes, or preservatives pregnant or trying to get pregnant breast-feeding How should I use this medication? This vaccine is injected into a muscle. It is given by a health care provider. A copy of Vaccine Information Statements will be given before each vaccination. Be sure to read this information carefully each time. This sheet may change often. Talk to your health care provider about the use of this vaccine in children. This vaccine is not approved for use in children. Overdosage: If you think you have taken too much of this medicine contact a poison control center or emergency room at once. NOTE: This medicine is only for you. Do not share this medicine with others. What if I miss a dose? Keep appointments for follow-up (booster) doses. It is important not to miss your dose. Call your health care provider if you are unable to keep an appointment. What may interact with this medication? medicines that suppress your immune system medicines to treat cancer steroid medicines like prednisone or cortisone This list may not describe all possible interactions. Give your health care provider a list of all the medicines, herbs, non-prescription drugs, or dietary supplements you use. Also tell them if you smoke, drink alcohol, or use  illegal drugs. Some items may interact with your medicine. What should I watch for while using this medication? Visit your health care provider regularly. This vaccine, like all vaccines, may not fully protect everyone. What side effects may I notice from receiving this medication? Side effects that you should report to your doctor or health care professional as soon as possible: allergic reactions (skin rash, itching or hives; swelling of the face, lips, or tongue) trouble breathing Side effects that usually do not require medical attention (report these to your doctor or health care professional if they continue or are bothersome): chills headache fever nausea pain, redness, or irritation at site where injected tiredness vomiting This list may not describe all possible side effects. Call your doctor for medical advice about side effects. You may report side effects to FDA at 1-800-FDA-1088. Where should I keep my medication? This vaccine is only given by a health care provider. It will not be stored at home. NOTE: This sheet is a summary. It may not cover all possible information. If you have questions about this medicine, talk to your doctor, pharmacist, or health care provider.  2022 Elsevier/Gold Standard (2021-09-02 00:00:00)  Urinary Tract Infection, Adult A urinary tract infection (UTI) is an infection of any part of the urinary tract. The urinary tract includes the kidneys, ureters, bladder, and urethra. These organs make, store, and get rid of urine in the body. An upper UTI affects the ureters and kidneys. A lower UTI affects the bladder and urethra. What are the causes? Most urinary tract infections are caused by bacteria in your genital area around your urethra, where  urine leaves your body. These bacteria grow and cause inflammation of your urinary tract. What increases the risk? You are more likely to develop this condition if: You have a urinary catheter that stays in  place. You are not able to control when you urinate or have a bowel movement (incontinence). You are female and you: Use a spermicide or diaphragm for birth control. Have low estrogen levels. Are pregnant. You have certain genes that increase your risk. You are sexually active. You take antibiotic medicines. You have a condition that causes your flow of urine to slow down, such as: An enlarged prostate, if you are female. Blockage in your urethra. A kidney stone. A nerve condition that affects your bladder control (neurogenic bladder). Not getting enough to drink, or not urinating often. You have certain medical conditions, such as: Diabetes. A weak disease-fighting system (immunesystem). Sickle cell disease. Gout. Spinal cord injury. What are the signs or symptoms? Symptoms of this condition include: Needing to urinate right away (urgency). Frequent urination. This may include small amounts of urine each time you urinate. Pain or burning with urination. Blood in the urine. Urine that smells bad or unusual. Trouble urinating. Cloudy urine. Vaginal discharge, if you are female. Pain in the abdomen or the lower back. You may also have: Vomiting or a decreased appetite. Confusion. Irritability or tiredness. A fever or chills. Diarrhea. The first symptom in older adults may be confusion. In some cases, they may not have any symptoms until the infection has worsened. How is this diagnosed? This condition is diagnosed based on your medical history and a physical exam. You may also have other tests, including: Urine tests. Blood tests. Tests for STIs (sexually transmitted infections). If you have had more than one UTI, a cystoscopy or imaging studies may be done to determine the cause of the infections. How is this treated? Treatment for this condition includes: Antibiotic medicine. Over-the-counter medicines to treat discomfort. Drinking enough water to stay hydrated. If you  have frequent infections or have other conditions such as a kidney stone, you may need to see a health care provider who specializes in the urinary tract (urologist). In rare cases, urinary tract infections can cause sepsis. Sepsis is a life-threatening condition that occurs when the body responds to an infection. Sepsis is treated in the hospital with IV antibiotics, fluids, and other medicines. Follow these instructions at home: Medicines Take over-the-counter and prescription medicines only as told by your health care provider. If you were prescribed an antibiotic medicine, take it as told by your health care provider. Do not stop using the antibiotic even if you start to feel better. General instructions Make sure you: Empty your bladder often and completely. Do not hold urine for long periods of time. Empty your bladder after sex. Wipe from front to back after urinating or having a bowel movement if you are female. Use each tissue only one time when you wipe. Drink enough fluid to keep your urine pale yellow. Keep all follow-up visits. This is important. Contact a health care provider if: Your symptoms do not get better after 1-2 days. Your symptoms go away and then return. Get help right away if: You have severe pain in your back or your lower abdomen. You have a fever or chills. You have nausea or vomiting. Summary A urinary tract infection (UTI) is an infection of any part of the urinary tract, which includes the kidneys, ureters, bladder, and urethra. Most urinary tract infections are caused by bacteria  in your genital area. Treatment for this condition often includes antibiotic medicines. If you were prescribed an antibiotic medicine, take it as told by your health care provider. Do not stop using the antibiotic even if you start to feel better. Keep all follow-up visits. This is important. This information is not intended to replace advice given to you by your health care  provider. Make sure you discuss any questions you have with your health care provider. Document Revised: 07/26/2020 Document Reviewed: 07/26/2020 Elsevier Patient Education  [place].

## 2022-01-09 LAB — URINE CULTURE
MICRO NUMBER:: 12854174
SPECIMEN QUALITY:: ADEQUATE

## 2022-01-09 LAB — URINALYSIS W MICROSCOPIC + REFLEX CULTURE
Bilirubin Urine: NEGATIVE
Glucose, UA: NEGATIVE
Hyaline Cast: NONE SEEN /LPF
Ketones, ur: NEGATIVE
Nitrites, Initial: POSITIVE — AB
Protein, ur: NEGATIVE
Specific Gravity, Urine: 1.015 (ref 1.001–1.035)
Squamous Epithelial / HPF: NONE SEEN /HPF (ref <=5)
WBC, UA: >=60 /HPF — AB (ref 0–5)
pH: 5.5 (ref 5.0–8.0)

## 2022-01-09 LAB — CULTURE INDICATED

## 2022-01-23 ENCOUNTER — Ambulatory Visit: Payer: PPO | Admitting: Internal Medicine

## 2022-01-29 ENCOUNTER — Encounter: Payer: Self-pay | Admitting: Internal Medicine

## 2022-01-29 ENCOUNTER — Other Ambulatory Visit: Payer: Self-pay

## 2022-01-29 ENCOUNTER — Ambulatory Visit (INDEPENDENT_AMBULATORY_CARE_PROVIDER_SITE_OTHER): Payer: PPO | Admitting: Internal Medicine

## 2022-01-29 VITALS — BP 132/80 | HR 74 | Temp 98.4°F | Resp 16 | Ht 64.02 in | Wt 108.4 lb

## 2022-01-29 DIAGNOSIS — N390 Urinary tract infection, site not specified: Secondary | ICD-10-CM | POA: Insufficient documentation

## 2022-01-29 DIAGNOSIS — E039 Hypothyroidism, unspecified: Secondary | ICD-10-CM | POA: Diagnosis not present

## 2022-01-29 DIAGNOSIS — R87622 Low grade squamous intraepithelial lesion on cytologic smear of vagina (LGSIL): Secondary | ICD-10-CM | POA: Diagnosis not present

## 2022-01-29 DIAGNOSIS — I10 Essential (primary) hypertension: Secondary | ICD-10-CM | POA: Diagnosis not present

## 2022-01-29 DIAGNOSIS — E7800 Pure hypercholesterolemia, unspecified: Secondary | ICD-10-CM

## 2022-01-29 DIAGNOSIS — G8320 Monoplegia of upper limb affecting unspecified side: Secondary | ICD-10-CM

## 2022-01-29 DIAGNOSIS — E538 Deficiency of other specified B group vitamins: Secondary | ICD-10-CM | POA: Diagnosis not present

## 2022-01-29 DIAGNOSIS — F320 Major depressive disorder, single episode, mild: Secondary | ICD-10-CM

## 2022-01-29 DIAGNOSIS — N3001 Acute cystitis with hematuria: Secondary | ICD-10-CM | POA: Diagnosis not present

## 2022-01-29 DIAGNOSIS — K219 Gastro-esophageal reflux disease without esophagitis: Secondary | ICD-10-CM | POA: Diagnosis not present

## 2022-01-29 DIAGNOSIS — Z8673 Personal history of transient ischemic attack (TIA), and cerebral infarction without residual deficits: Secondary | ICD-10-CM

## 2022-01-29 DIAGNOSIS — M0600 Rheumatoid arthritis without rheumatoid factor, unspecified site: Secondary | ICD-10-CM | POA: Diagnosis not present

## 2022-01-29 LAB — URINALYSIS, ROUTINE W REFLEX MICROSCOPIC
Bilirubin Urine: NEGATIVE
Hgb urine dipstick: NEGATIVE
Leukocytes,Ua: NEGATIVE
Nitrite: NEGATIVE
RBC / HPF: NONE SEEN (ref 0–?)
Specific Gravity, Urine: 1.025 (ref 1.000–1.030)
Total Protein, Urine: NEGATIVE
Urine Glucose: NEGATIVE
Urobilinogen, UA: 0.2 (ref 0.0–1.0)
pH: 5.5 (ref 5.0–8.0)

## 2022-01-29 NOTE — Progress Notes (Signed)
Patient ID: Donna Rhodes, female   DOB: 11/24/1946, 76 y.o.   MRN: [id number]   Subjective:    Patient ID: Donna Rhodes, female    DOB: 02-24-1946, 76 y.o.   MRN: [id number]  This visit occurred during the SARS-CoV-2 public health emergency.  Safety protocols were in place, including screening questions prior to the visit, additional usage of staff PPE, and extensive cleaning of exam room while observing appropriate contact time as indicated for disinfecting solutions.   Patient here for a scheduled followup.   Chief Complaint  Patient presents with   Follow-up    3 months. Saw Dr.Elston at walk in clinic for bladder infection was prescribed prednisone and doxycline. Saw Dr.tracy 1/10 for same issue she recommended Azo Cranberry. Never got a call back with results on urine per patient.    Marland Kitchen   HPI Evaluated 12/19/21 - acute care - diagnosed with bronchitis and UTI.  Treated with mucinex and doxycycline and prednisone.  Reevaluated 01/06/22 - Dr Aundra Dubin - UTI.  Treated with macrobid.  Note reviewed.  Comes in today - states she will still feel intermittent sensation like she is going to start her period.  No actual dysuria.  No hematuria now.  No chest pain.  Breathing stable.  No increased cough or congestion.  States she has taken amoxicillin recently and tolerated.  Wants removed from her allergy list.  Increased stress.  Overall appears to be handling things relatively well.     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  PT 2x/wk.   Lung nodule    stable   Menopausal state    Osteoarthritis    hand involvement, cervical  disc disease   Osteoporosis    Pelvic pain in female    Rheumatoid arthritis ([place])    Stroke (cerebrum) ([place]) 04/24/2017   Thyroid nodule    pt unaware of this    Vaginal Pap smear, abnormal    lgsil   Past Surgical History:  Procedure Laterality Date   [place]   left   CATARACT EXTRACTION W/PHACO Right 10/27/2017   Procedure: CATARACT EXTRACTION PHACO AND INTRAOCULAR LENS PLACEMENT ([place]) RIGHT;  Surgeon: Leandrew Koyanagi, MD;  Location: [facility];  Service: Ophthalmology;  Laterality: Right;  requests early (8-8:30 arrival)   CATARACT EXTRACTION W/PHACO Left 11/10/2017   Procedure: CATARACT EXTRACTION PHACO AND INTRAOCULAR LENS PLACEMENT ([place]) LEFT;  Surgeon: Leandrew Koyanagi, MD;  Location: [facility];  Service: Ophthalmology;  Laterality: Left;   CHOLECYSTECTOMY     FINGER SURGERY  1982   laser vein surgery     LOOP RECORDER INSERTION N/A 09/24/2017   Procedure: LOOP RECORDER INSERTION;  Surgeon: Evans Lance, MD;  Location: [facility];  Service: Cardiovascular;  Laterality: N/A;   MANDIBLE RECONSTRUCTION  1992   TOENAIL EXCISION  1998   several   TONSILLECTOMY     TUBAL LIGATION  1977   VAGINAL HYSTERECTOMY  1983   als0-  anterior/posterior colporrhaphy   VAGINAL WOUND CLOSURE / REPAIR  1999, 2000   VAGINAL WOUND CLOSURE / REPAIR     Family History  Problem Relation Age of Onset   Hypertension Father    Dementia Father    Osteoarthritis Mother    Arthritis/Rheumatoid Sister    Breast cancer Other        paternal side   Breast cancer Paternal Aunt    Breast cancer Cousin        maternal   Uterine cancer Maternal Grandmother    Ovarian cancer Neg Hx    Heart disease Neg Hx    Diabetes Neg Hx    Colon cancer Neg Hx    Social History   Socioeconomic History   Marital status: Divorced    Spouse name: Not on file   Number of children: 3   Years of education: 12   Highest  education level: Not on file  Occupational History    Comment: retired  Tobacco Use   Smoking status: Former    Types: Cigarettes    Quit date: 12/29/1979    Years since quitting: 42.1   Smokeless tobacco: Never  Vaping Use   Vaping Use: Never used  Substance and Sexual Activity   Alcohol use: No    Alcohol/week: 0.0 standard drinks   Drug use: No   Sexual activity: Not Currently    Birth control/protection: Surgical  Other Topics Concern   Not on file  Social History Narrative   Her mother and her daughter live with her   Caffeine- 1 cup coffee   Social Determinants of Health   Financial Resource Strain: Low Risk    Difficulty of Paying Living Expenses: Not hard at all  Food Insecurity: No Food Insecurity   Worried About Charity fundraiser in the Last Year: Never true   Ran Out of Food in the Last Year: Never true  Transportation Needs: No Transportation Needs   Lack of Transportation (Medical): No   Lack of Transportation (Non-Medical): No  Physical Activity: Not on file  Stress: No Stress Concern Present   Feeling of Stress : Only a little  Social Connections: Unknown   Frequency of Communication with Friends and Family: More than three times a week   Frequency of Social Gatherings with Friends and Family: More than three times a week   Attends Religious Services: Not on Electrical engineer or Organizations: Not on file   Attends Archivist Meetings: Not on file   Marital Status: Not on file     Review of Systems  Constitutional:  Negative for appetite change and unexpected weight change.  HENT:  Negative for congestion and sinus pressure.   Respiratory:  Negative for cough, chest tightness and shortness of breath.   Cardiovascular:  Negative for chest pain, palpitations and leg swelling.  Gastrointestinal:  Negative for abdominal pain, diarrhea, nausea and vomiting.  Genitourinary:  Negative for difficulty urinating and dysuria.   Musculoskeletal:  Negative for myalgias.       Seeing Dr Jefm Bryant for her joint pain - RA.    Skin:  Negative for color change and rash.  Neurological:  Negative for dizziness, light-headedness and headaches.  Psychiatric/Behavioral:  Negative for agitation and dysphoric mood.       Objective:     BP 132/80 (BP Location: Right Arm, Patient Position: Sitting, Cuff Size: Small)    Pulse 74    Temp 98.4 F (36.9 C) (  Oral)    Resp 16    Ht 5' 4.02" (1.626 m)    Wt 108 lb 6.4 oz (49.2 kg)    LMP 12/29/1981    SpO2 99%    BMI 18.60 kg/m  Wt Readings from Last 3 Encounters:  01/29/22 108 lb 6.4 oz (49.2 kg)  01/06/22 108 lb 9.6 oz (49.3 kg)  11/17/21 109 lb 3.2 oz (49.5 kg)    Physical Exam Vitals reviewed.  Constitutional:      General: She is not in acute distress.    Appearance: Normal appearance.  HENT:     Head: Normocephalic and atraumatic.     Right Ear: External ear normal.     Left Ear: External ear normal.  Eyes:     General: No scleral icterus.       Right eye: No discharge.        Left eye: No discharge.     Conjunctiva/sclera: Conjunctivae normal.  Neck:     Thyroid: No thyromegaly.  Cardiovascular:     Rate and Rhythm: Normal rate and regular rhythm.  Pulmonary:     Effort: No respiratory distress.     Breath sounds: Normal breath sounds. No wheezing.  Abdominal:     General: Bowel sounds are normal.     Palpations: Abdomen is soft.     Tenderness: There is no abdominal tenderness.  Musculoskeletal:        General: No swelling or tenderness.     Cervical back: Neck supple. No tenderness.  Lymphadenopathy:     Cervical: No cervical adenopathy.  Skin:    Findings: No erythema or rash.  Neurological:     Mental Status: She is alert.  Psychiatric:        Mood and Affect: Mood normal.        Behavior: Behavior normal.     Outpatient Encounter Medications as of 01/29/2022  Medication Sig   acetaminophen (TYLENOL) 500 MG tablet Take 1-2 tablets (500-1,000  mg total) by mouth every 6 (six) hours as needed for mild pain.   acyclovir (ZOVIRAX) 200 MG capsule Take 1 capsule (200 mg total) by mouth 2 (two) times daily.   amLODipine (NORVASC) 2.5 MG tablet TAKE 1 TABLET BY MOUTH DAILY   atorvastatin (LIPITOR) 20 MG tablet TAKE 1 TABLET BY MOUTH DAILY   calcium-vitamin D (OSCAL WITH D) 250-125 MG-UNIT tablet Take 1 tablet by mouth daily.   clopidogrel (PLAVIX) 75 MG tablet TAKE 1 TABLET BY MOUTH DAILY   folic acid (FOLVITE) 1 MG tablet Take 1 mg by mouth daily.   levothyroxine (SYNTHROID) 50 MCG tablet TAKE 1 TABLET BY MOUTH DAILY   methotrexate 2.5 MG tablet Take 10 mg by mouth once a week.   nitrofurantoin, macrocrystal-monohydrate, (MACROBID) 100 MG capsule Take 1 capsule (100 mg total) by mouth 2 (two) times daily. X 5 days   pantoprazole (PROTONIX) 20 MG tablet TAKE 1 TABLET BY MOUTH DAILY   No facility-administered encounter medications on file as of 01/29/2022.     Lab Results  Component Value Date   WBC 6.9 11/17/2021   HGB 12.2 11/17/2021   HCT 37.4 11/17/2021   PLT 272.0 11/17/2021   GLUCOSE 91 11/17/2021   CHOL 147 10/17/2021   TRIG 58.0 10/17/2021   HDL 68.50 10/17/2021   LDLDIRECT 128.0 05/25/2013   LDLCALC 67 10/17/2021   ALT 16 10/17/2021   AST 20 10/17/2021   NA 139 11/17/2021   K 3.9 11/17/2021   CL 105  11/17/2021   CREATININE 0.68 11/17/2021   BUN 16 11/17/2021   CO2 26 11/17/2021   TSH 2.89 11/17/2021   HGBA1C 5.7 (H) 04/25/2017    MM 3D SCREEN BREAST BILATERAL  Result Date: 10/23/2021 CLINICAL DATA:  Screening. EXAM: DIGITAL SCREENING BILATERAL MAMMOGRAM WITH TOMOSYNTHESIS AND CAD TECHNIQUE: Bilateral screening digital craniocaudal and mediolateral oblique mammograms were obtained. Bilateral screening digital breast tomosynthesis was performed. The images were evaluated with computer-aided detection. COMPARISON:  Previous exam(s). ACR Breast Density Category c: The breast tissue is heterogeneously dense, which may  obscure small masses. FINDINGS: There are no findings suspicious for malignancy. IMPRESSION: No mammographic evidence of malignancy. A result letter of this screening mammogram will be mailed directly to the patient. RECOMMENDATION: Screening mammogram in one year. (Code:SM-B-01Y) BI-RADS CATEGORY  1: Negative. Electronically Signed   By: Fidela Salisbury M.D.   On: 10/23/2021 15:53      Assessment & Plan:   Problem List Items Addressed This Visit     B12 deficiency    B12 injections.       Relevant Orders   Vitamin B12   Essential hypertension    Continue amlodipine.  Blood pressure as outlined.  Follow pressures.  Follow metabolic panel.       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  Continue exercise.  Follow.       GERD (gastroesophageal reflux disease)    Continue protonix.  No acid reflux reported.  Follow.       History of CVA (cerebrovascular accident)    Continue plavix and lipitor.  Residual effects as outlined.       Hypercholesterolemia    Continue lipitor.  Follow lipid panel and liver function tests.        Relevant Orders   Hepatic function panel   Lipid panel   Hypothyroidism, unspecified    On thyroid replacement.  Follow tsh.       LGSIL Pap smear of vagina    Has been followed by gyn.  Need to confirm has f/u scheduled.       Major depressive disorder, single episode, mild ([place]) - Primary    Discussed.  Overall she feels she is handling things relatively well.  Follow.       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)    On MTX.  Seeing Dr Jefm Bryant. Last evaluated 12/25/21.  Stable.       UTI (urinary tract infection)    Has been treated recently for UTI with doxycycline and macrobid.  Persistent intermittent symptoms as outlined.  Recheck urine and culture today.  Need to confirm infection cleared.  Also confirm no blood.        Relevant Orders    Urinalysis, Routine w reflex microscopic (Completed)   Urine Culture (Completed)     Einar Pheasant, MD

## 2022-01-30 LAB — URINE CULTURE
MICRO NUMBER:: 12955198
Result:: NO GROWTH
SPECIMEN QUALITY:: ADEQUATE

## 2022-02-01 ENCOUNTER — Encounter: Payer: Self-pay | Admitting: Internal Medicine

## 2022-02-01 DIAGNOSIS — F320 Major depressive disorder, single episode, mild: Secondary | ICD-10-CM | POA: Insufficient documentation

## 2022-02-01 NOTE — Assessment & Plan Note (Signed)
Continue amlodipine.  Blood pressure as outlined.  Follow pressures.  Follow metabolic panel.  

## 2022-02-01 NOTE — Assessment & Plan Note (Signed)
B12 injections 

## 2022-02-01 NOTE — Assessment & Plan Note (Signed)
Continue lipitor.  Follow lipid panel and liver function tests.   

## 2022-02-01 NOTE — Assessment & Plan Note (Signed)
On MTX.  Seeing Dr Jefm Bryant. Last evaluated 12/25/21.  Stable.

## 2022-02-01 NOTE — Assessment & Plan Note (Signed)
Discussed.  Overall she feels she is handling things relatively well.  Follow.

## 2022-02-01 NOTE — Assessment & Plan Note (Signed)
Has been followed by gyn.  Need to confirm has f/u scheduled.

## 2022-02-01 NOTE — Assessment & Plan Note (Signed)
Has been treated recently for UTI with doxycycline and macrobid.  Persistent intermittent symptoms as outlined.  Recheck urine and culture today.  Need to confirm infection cleared.  Also confirm no blood.

## 2022-02-01 NOTE — Assessment & Plan Note (Signed)
Continue plavix and lipitor.  Residual effects as outlined.

## 2022-02-01 NOTE — Assessment & Plan Note (Signed)
Still some limitations.  Continue exercise.  Follow.  

## 2022-02-01 NOTE — Assessment & Plan Note (Signed)
Continue protonix.  No acid reflux reported.  Follow.  

## 2022-02-01 NOTE — Assessment & Plan Note (Signed)
On thyroid replacement.  Follow tsh.  

## 2022-02-03 ENCOUNTER — Other Ambulatory Visit: Payer: Self-pay | Admitting: Internal Medicine

## 2022-02-03 DIAGNOSIS — R102 Pelvic and perineal pain: Secondary | ICD-10-CM

## 2022-02-06 ENCOUNTER — Encounter: Payer: Self-pay | Admitting: Obstetrics and Gynecology

## 2022-02-06 ENCOUNTER — Telehealth: Payer: Self-pay | Admitting: Internal Medicine

## 2022-02-06 NOTE — Telephone Encounter (Signed)
[facility] has reached out. Patient given number to schedule.

## 2022-02-06 NOTE — Telephone Encounter (Signed)
Pt is calling about an update to a referral [facility] with Dr. Amalia Hailey.

## 2022-02-10 ENCOUNTER — Ambulatory Visit: Payer: PPO | Admitting: Obstetrics and Gynecology

## 2022-02-10 ENCOUNTER — Other Ambulatory Visit: Payer: Self-pay

## 2022-02-10 ENCOUNTER — Encounter: Payer: Self-pay | Admitting: Obstetrics and Gynecology

## 2022-02-10 VITALS — BP 120/69 | HR 85 | Ht 64.0 in | Wt 107.8 lb

## 2022-02-10 DIAGNOSIS — R102 Pelvic and perineal pain: Secondary | ICD-10-CM | POA: Diagnosis not present

## 2022-02-10 DIAGNOSIS — Z87440 Personal history of urinary (tract) infections: Secondary | ICD-10-CM | POA: Diagnosis not present

## 2022-02-10 LAB — POCT URINALYSIS DIPSTICK
Bilirubin, UA: NEGATIVE
Blood, UA: NEGATIVE
Glucose, UA: NEGATIVE
Ketones, UA: NEGATIVE
Leukocytes, UA: NEGATIVE
Nitrite, UA: NEGATIVE
Protein, UA: NEGATIVE
Spec Grav, UA: >=1.03 — AB (ref 1.010–1.025)
Urobilinogen, UA: 0.2 E.U./dL
pH, UA: 6 (ref 5.0–8.0)

## 2022-02-10 NOTE — Progress Notes (Signed)
HPI:      Donna Rhodes is a 76 y.o. [phone] who LMP was Patient's last menstrual period was 12/29/1981.  Subjective:   She presents today with complaint of midline pelvic pain for approximately 1 week.  She reports that she has had frequent bladder infections in the past.  She states that this does not exactly feel like 1 of those infections.  She also states that she is regularly constipated and uses stool softeners and Colace to have bowel movements.  She does not feel like the pain is from her constipation. Patient also complains of vaginal dryness and this asking about possible future use of estrogen. She also states that she has previously had abnormal Pap smears of the vagina and colposcopies.  She says that she was scheduled for a colposcopy when she had her stroke and was not able to come back in the last 5 years.    Hx: The following portions of the patient's history were reviewed and updated as appropriate:             She  has a past medical history of Atrophic vaginitis, Dysplasia of vagina, GERD (gastroesophageal reflux disease), HCV infection, History of basal cell carcinoma (BCC) (04/11/2019), HSV (herpes simplex virus) infection, Hypercholesterolemia, Hypertension, Hypothyroidism, Left-sided weakness, Lung nodule, Menopausal state, Osteoarthritis, Osteoporosis, Pelvic pain in female, Rheumatoid arthritis ([place]), Stroke (cerebrum) ([place]) (04/24/2017), Thyroid nodule, and Vaginal Pap smear, abnormal. She does not have any pertinent problems on file. She  has a past surgical history that includes Appendectomy (1965); Tubal ligation (1977); Finger surgery (1982); Mandible reconstruction (1992); Bunionectomy (1994); Toenail excision (1998); Vaginal wound closure / repair (1999, 2000); Cholecystectomy; Vaginal hysterectomy (1983); Adenoidectomy; laser vein surgery; Vaginal wound closure / repair; LOOP RECORDER INSERTION (N/A, 09/24/2017); Cataract extraction w/PHACO (Right, 10/27/2017);  Cataract extraction w/PHACO (Left, 11/10/2017); and Tonsillectomy. Her family history includes Arthritis/Rheumatoid in her sister; Breast cancer in her cousin, paternal aunt, and another family member; Dementia in her father; Hypertension in her father; Osteoarthritis in her mother; Uterine cancer in her maternal grandmother. She  reports that she quit smoking about 42 years ago. Her smoking use included cigarettes. She has never used smokeless tobacco. She reports that she does not drink alcohol and does not use drugs. She has a current medication list which includes the following prescription(s): acetaminophen, acyclovir, amlodipine, atorvastatin, calcium-vitamin d, clopidogrel, folic acid, ketoconazole, levothyroxine, methotrexate, pantoprazole, and nitrofurantoin (macrocrystal-monohydrate). She is allergic to amoxicillin, avelox [moxifloxacin hcl in nacl], azithromycin, bacitracin, codeine, estroven weight management [nutritional supplements], fish oil, food, moxifloxacin, naproxen, nsaids, and cefepime.       Review of Systems:  Review of Systems  Constitutional: Denied constitutional symptoms, night sweats, recent illness, fatigue, fever, insomnia and weight loss.  Eyes: Denied eye symptoms, eye pain, photophobia, vision change and visual disturbance.  Ears/Nose/Throat/Neck: Denied ear, nose, throat or neck symptoms, hearing loss, nasal discharge, sinus congestion and sore throat.  Cardiovascular: Denied cardiovascular symptoms, arrhythmia, chest pain/pressure, edema, exercise intolerance, orthopnea and palpitations.  Respiratory: Denied pulmonary symptoms, asthma, pleuritic pain, productive sputum, cough, dyspnea and wheezing.  Gastrointestinal: Denied, gastro-esophageal reflux, melena, nausea and vomiting.  Genitourinary: See HPI for additional information.  Musculoskeletal: Denied musculoskeletal symptoms, stiffness, swelling, muscle weakness and myalgia.  Dermatologic: Denied dermatology  symptoms, rash and scar.  Neurologic: Denied neurology symptoms, dizziness, headache, neck pain and syncope.  Psychiatric: Denied psychiatric symptoms, anxiety and depression.  Endocrine: Denied endocrine symptoms including hot flashes and night sweats.   Meds:  Current Outpatient Medications on File Prior to Visit  Medication Sig Dispense Refill   acetaminophen (TYLENOL) 500 MG tablet Take 1-2 tablets (500-1,000 mg total) by mouth every 6 (six) hours as needed for mild pain. 30 tablet 0   acyclovir (ZOVIRAX) 200 MG capsule Take 1 capsule (200 mg total) by mouth 2 (two) times daily. 180 capsule 3   amLODipine (NORVASC) 2.5 MG tablet TAKE 1 TABLET BY MOUTH DAILY 90 tablet 1   atorvastatin (LIPITOR) 20 MG tablet TAKE 1 TABLET BY MOUTH DAILY 90 tablet 1   calcium-vitamin D (OSCAL WITH D) 250-125 MG-UNIT tablet Take 1 tablet by mouth daily.     clopidogrel (PLAVIX) 75 MG tablet TAKE 1 TABLET BY MOUTH DAILY 90 tablet 1   folic acid (FOLVITE) 1 MG tablet Take 1 mg by mouth daily.     ketoconazole (NIZORAL) 2 % cream Apply 1 application topically daily.     levothyroxine (SYNTHROID) 50 MCG tablet TAKE 1 TABLET BY MOUTH DAILY 90 tablet 0   methotrexate 2.5 MG tablet Take 10 mg by mouth once a week.     pantoprazole (PROTONIX) 20 MG tablet TAKE 1 TABLET BY MOUTH DAILY 90 tablet 0   nitrofurantoin, macrocrystal-monohydrate, (MACROBID) 100 MG capsule Take 1 capsule (100 mg total) by mouth 2 (two) times daily. X 5 days (Patient not taking: Reported on 02/10/2022) 10 capsule 0   No current facility-administered medications on file prior to visit.      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   Complete tear of left rotator cuff 02/28/2018   Rotator cuff tendinitis, left 02/28/2018   Left arm pain 11/05/2017   Nocturia 08/13/2017   Urge incontinence 08/13/2017   Adhesive capsulitis of left shoulder 08/06/2017   Increased frequency of urination 07/11/2017   Gait disturbance, post-stroke 05/31/2017   Adjustment disorder with mixed anxiety and depressed mood    Hypokalemia    Flaccid monoplegia of upper extremity (HCC)    Benign essential HTN    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 ([place]) 04/28/2017   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[place]) 04/27/2017   History of CVA (cerebrovascular accident) 04/24/2017   Palpitations 04/23/2017   LGSIL Pap smear of vagina 12/15/2016   Anal skin tag 02/02/2016   Inflammatory arthritis 01/19/2016   Right knee pain 01/19/2016   Genital herpes 10/20/2015   GERD (gastroesophageal reflux disease) 10/20/2015   Status post vaginal hysterectomy 10/20/2015   Health care maintenance 05/30/2015   LLQ pain 03/31/2014   Menopausal symptoms 03/31/2014   Generalized abdominal pain 10/05/2013   Female stress incontinence 08/18/2013   Incomplete emptying of bladder 08/18/2013   Bladder pain 08/18/2013   Hypercholesterolemia 12/31/2012   Hypothyroidism, unspecified 12/29/2012   Osteoarthritis 12/29/2012   Benign  neoplasm of mouth 09/14/2012   PULMONARY NODULE 01/02/2008   Mild depression 12/30/2007   Essential hypertension 12/30/2007   Depression 12/30/2007     1. Pelvic pain     Present over the last week.  Patient describes this pain as crampy midline pain.  She says she has frequent bladder infections but she is not sure that this is that.   Plan:             1.  Patient unable to urinate-she would like to drink some water and attempt urination for UA and C&S.  2.  Discussed possible use of vaginal estrogen in the future.  Patient does not want to be examined today but would like to consider estrogen in the future.  3.  Abnormal Paps of the vaginal cuff discussed in detail.  Patient previously had LGSIL but a negative colposcopy.  Based on her low risk we have discussed the possibility of a repeat Pap or simply considering her aged out of Pap smears because her risk of vaginal cancer is very low.  She would like to consider these options and inform us at her next visit.  Orders No orders of the defined types were placed in this encounter.   No orders of the defined types were placed in this encounter.     F/U  Return for Annual Physical. I spent 31 minutes involved in the care of this patient preparing to see the patient by obtaining and reviewing her medical history (including labs, imaging tests and prior procedures), documenting clinical information in the electronic health record (EHR), counseling and coordinating care plans, writing and sending prescriptions, ordering tests or procedures and in direct communicating with the patient and medical staff discussing pertinent items from her history and physical exam.  Finis Bud, M.D. 02/10/2022 3:24 PM

## 2022-02-10 NOTE — Progress Notes (Signed)
Patient presents today for pelvic pain. Patient states her pain is similar to starting a period. Patient states she has a history of bladder infections along with incontinence. Patient states a history of abnormal pap smears with colposcopies. Patient states vaginal dryness.  Patient states no other concerns.

## 2022-02-10 NOTE — Addendum Note (Signed)
Addended by: Douglass Rivers R on: 02/10/2022 04:10 PM   Modules accepted: Orders

## 2022-02-13 ENCOUNTER — Telehealth: Payer: Self-pay | Admitting: Obstetrics and Gynecology

## 2022-02-13 DIAGNOSIS — M25561 Pain in right knee: Secondary | ICD-10-CM | POA: Diagnosis not present

## 2022-02-13 DIAGNOSIS — M170 Bilateral primary osteoarthritis of knee: Secondary | ICD-10-CM | POA: Diagnosis not present

## 2022-02-13 DIAGNOSIS — M25562 Pain in left knee: Secondary | ICD-10-CM | POA: Diagnosis not present

## 2022-02-13 LAB — URINE CULTURE: Organism ID, Bacteria: NO GROWTH

## 2022-02-13 NOTE — Telephone Encounter (Signed)
Attempted to call patient, no answer and no voice mail 

## 2022-02-13 NOTE — Telephone Encounter (Signed)
Pt called asking about urine results, please advise.

## 2022-02-23 ENCOUNTER — Encounter: Payer: Self-pay | Admitting: Dermatology

## 2022-02-23 ENCOUNTER — Other Ambulatory Visit: Payer: Self-pay

## 2022-02-23 ENCOUNTER — Ambulatory Visit: Payer: PPO | Admitting: Dermatology

## 2022-02-23 DIAGNOSIS — L821 Other seborrheic keratosis: Secondary | ICD-10-CM

## 2022-02-23 DIAGNOSIS — D229 Melanocytic nevi, unspecified: Secondary | ICD-10-CM | POA: Diagnosis not present

## 2022-02-23 DIAGNOSIS — L219 Seborrheic dermatitis, unspecified: Secondary | ICD-10-CM

## 2022-02-23 DIAGNOSIS — L209 Atopic dermatitis, unspecified: Secondary | ICD-10-CM

## 2022-02-23 DIAGNOSIS — L578 Other skin changes due to chronic exposure to nonionizing radiation: Secondary | ICD-10-CM

## 2022-02-23 DIAGNOSIS — L738 Other specified follicular disorders: Secondary | ICD-10-CM

## 2022-02-23 DIAGNOSIS — L03012 Cellulitis of left finger: Secondary | ICD-10-CM | POA: Diagnosis not present

## 2022-02-23 DIAGNOSIS — Z85828 Personal history of other malignant neoplasm of skin: Secondary | ICD-10-CM

## 2022-02-23 MED ORDER — KETOCONAZOLE 2 % EX SHAM: 1.0000 "application " | MEDICATED_SHAMPOO | CUTANEOUS | 12 refills | Status: DC

## 2022-02-23 NOTE — Progress Notes (Signed)
Follow-Up Visit   Subjective  Donna Rhodes is a 76 y.o. female who presents for the following: Follow-up (Patient here today concerning at right lower eyelid. Reports history of bcc in April 2020 has mohs and reconstructive surgery. Patient thinks skin cancer may have came back at locations. Patient also reports spots at legs, bilateral upper arms, and finger nails. Also reports itchy scalp and would like refill of shampoo. ). The patient has spots, moles and lesions to be evaluated, some may be new or changing and the patient has concerns that these could be cancer.  The following portions of the chart were reviewed this encounter and updated as appropriate:  Tobacco   Allergies   Meds   Problems   Med Hx   Surg Hx   Fam Hx       Objective  Well appearing patient in no apparent distress; mood and affect are within normal limits.  A focused examination was performed including right leg, bilateral arms, face, right lower eyelid . Relevant physical exam findings are noted in the Assessment and Plan.  right lateral lower eyelid Some scar tissue at right lateral lower eyelid with no obvious evidence of recurrence   bilateral lower legs Scaly erythematous papules and patches +/- dyspigmentation, lichenification, excoriations.   Mid Frontal Scalp Pink patches with greasy scale.    Assessment & Plan  History of basal cell carcinoma (BCC) Right Lateral lower eyelid - S/P Mohs (Dr Lacinda Axon at [facility]) with Reconstruction (Dr Rosaura Carpenter at [facility]) [place] clear today with surgical changes, but no evidence of BCC recurrence - Pt complains of itch and changes to area. With no evidence of skin cancer recurrence, I would recommend evaluation by Ophthalmology/Oculoplastics - Dr Rosaura Carpenter to see if he finds any explanation for pts concerns. right lateral lower eyelid Bx proven Basal Cell Carcinoma, Nodular Pattern 04/11/2019 Accession : [id number] Treated in 2020 with Mohs and excised by Dr. Jene Every.  Reconstructed by Dr. Agapito Games scar tissue Recommend referral with Dr. Rosaura Carpenter , for further evaluation   Related Procedures Ambulatory referral to Ophthalmology  Atopic dermatitis bilateral lower legs Atopic dermatitis (eczema) is a chronic, relapsing, pruritic condition that can significantly affect quality of life. It is often associated with allergic rhinitis and/or asthma and can require treatment with topical medications, phototherapy, or in severe cases biologic injectable medication (Dupixent; Adbry) or Oral JAK inhibitors.  Patient has prescription of Flucinonide 0.05 % oint- full tube Continue flucinonide 0.05 % oint - apply to affected areas for 5 days a week prn when flared. Use until clear.   Paronychia of left ring finger - in past - improved Mostly clear today, but symptom of itch Left 4th Finger Nail Plate Patient has prescription of Flucinonide 0.05 % cream Continue flucinonide 0.05 % cream - apply to affected areas for 5 days a week prn when flared. Use until clear.   Seborrheic dermatitis Scalp Seborrheic Dermatitis  -  is a chronic persistent rash characterized by pinkness and scaling most commonly of the mid face but also can occur on the scalp (dandruff), ears; mid chest, mid back and groin.  It tends to be exacerbated by stress and cooler weather.  People who have neurologic disease may experience new onset or exacerbation of existing seborrheic dermatitis.  The condition is not curable but treatable and can be controlled.   Ketoconazole shampoo 2 x weekly. massage into scalp and leave in for 10 minutes before rinsing out [facility]  ketoconazole (NIZORAL) 2 % shampoo - Mid Frontal Scalp Apply 1 application topically 2 (two) times a week. massage into scalp and leave in for 5 - 10 minutes before rinsing out  Sebaceous Hyperplasia - Small yellow papules with a central dell at right glabella and other on face  Discuss surgery option but do not  recommend.  - Benign - Observe  Seborrheic Keratoses - Stuck-on, waxy, tan-brown papules and/or plaques  left shoulder - Benign-appearing - Discussed benign etiology and prognosis. - Observe - Call for any changes  Melanocytic Nevi - Tan-brown and/or pink-flesh-colored symmetric macules and papules right thigh  - Benign appearing on exam today - Observation - Call clinic for new or changing moles - Recommend daily use of broad spectrum spf 30+ sunscreen to sun-exposed areas.   Actinic Damage - chronic, secondary to cumulative UV radiation exposure/sun exposure over time - diffuse scaly erythematous macules with underlying dyspigmentation - Recommend daily broad spectrum sunscreen SPF 30+ to sun-exposed areas, reapply every 2 hours as needed.  - Recommend staying in the shade or wearing long sleeves, sun glasses (UVA+UVB protection) and wide brim hats (4-inch brim around the entire circumference of the hat). - Call for new or changing lesions.  Return for 6 month tbse .  IRuthell Rummage, CMA, am acting as scribe for Sarina Ser, MD. Documentation: I have reviewed the above documentation for accuracy and completeness, and I agree with the above.  Sarina Ser, MD

## 2022-02-23 NOTE — Patient Instructions (Signed)

## 2022-02-24 ENCOUNTER — Encounter: Payer: Self-pay | Admitting: Dermatology

## 2022-02-25 ENCOUNTER — Ambulatory Visit: Payer: PPO

## 2022-03-23 ENCOUNTER — Other Ambulatory Visit: Payer: Self-pay

## 2022-03-23 ENCOUNTER — Other Ambulatory Visit (INDEPENDENT_AMBULATORY_CARE_PROVIDER_SITE_OTHER): Payer: PPO

## 2022-03-23 DIAGNOSIS — I10 Essential (primary) hypertension: Secondary | ICD-10-CM

## 2022-03-23 DIAGNOSIS — E538 Deficiency of other specified B group vitamins: Secondary | ICD-10-CM | POA: Diagnosis not present

## 2022-03-23 DIAGNOSIS — E7800 Pure hypercholesterolemia, unspecified: Secondary | ICD-10-CM

## 2022-03-23 LAB — LIPID PANEL
Cholesterol: 148 mg/dL (ref 0–200)
HDL: 65.9 mg/dL (ref >39.00)
LDL Cholesterol: 70 mg/dL (ref 0–99)
NonHDL: 81.62
Total CHOL/HDL Ratio: 2
Triglycerides: 56 mg/dL (ref 0.0–149.0)
VLDL: 11.2 mg/dL (ref 0.0–40.0)

## 2022-03-23 LAB — BASIC METABOLIC PANEL
BUN: 14 mg/dL (ref 6–23)
CO2: 30 mEq/L (ref 19–32)
Calcium: 9.2 mg/dL (ref 8.4–10.5)
Chloride: 108 mEq/L (ref 96–112)
Creatinine, Ser: 0.67 mg/dL (ref 0.40–1.20)
GFR: 84.93 mL/min (ref >60.00)
Glucose, Bld: 85 mg/dL (ref 70–99)
Potassium: 4.3 mEq/L (ref 3.5–5.1)
Sodium: 142 mEq/L (ref 135–145)

## 2022-03-23 LAB — HEPATIC FUNCTION PANEL
ALT: 21 U/L (ref 0–35)
AST: 24 U/L (ref 0–37)
Albumin: 4.2 g/dL (ref 3.5–5.2)
Alkaline Phosphatase: 92 U/L (ref 39–117)
Bilirubin, Direct: 0.1 mg/dL (ref 0.0–0.3)
Total Bilirubin: 0.6 mg/dL (ref 0.2–1.2)
Total Protein: 6.4 g/dL (ref 6.0–8.3)

## 2022-03-24 LAB — VITAMIN B12: Vitamin B-12: 208 pg/mL — ABNORMAL LOW (ref 211–911)

## 2022-03-25 DIAGNOSIS — M0609 Rheumatoid arthritis without rheumatoid factor, multiple sites: Secondary | ICD-10-CM | POA: Diagnosis not present

## 2022-03-25 DIAGNOSIS — Z79899 Other long term (current) drug therapy: Secondary | ICD-10-CM | POA: Diagnosis not present

## 2022-03-26 ENCOUNTER — Encounter: Payer: Self-pay | Admitting: Internal Medicine

## 2022-03-26 ENCOUNTER — Ambulatory Visit (INDEPENDENT_AMBULATORY_CARE_PROVIDER_SITE_OTHER): Payer: PPO | Admitting: Internal Medicine

## 2022-03-26 ENCOUNTER — Encounter: Payer: PPO | Admitting: Obstetrics and Gynecology

## 2022-03-26 VITALS — BP 118/78 | HR 78 | Temp 97.9°F | Ht 64.0 in | Wt 110.4 lb

## 2022-03-26 DIAGNOSIS — R87622 Low grade squamous intraepithelial lesion on cytologic smear of vagina (LGSIL): Secondary | ICD-10-CM

## 2022-03-26 DIAGNOSIS — I10 Essential (primary) hypertension: Secondary | ICD-10-CM | POA: Diagnosis not present

## 2022-03-26 DIAGNOSIS — E538 Deficiency of other specified B group vitamins: Secondary | ICD-10-CM | POA: Diagnosis not present

## 2022-03-26 DIAGNOSIS — G8320 Monoplegia of upper limb affecting unspecified side: Secondary | ICD-10-CM | POA: Diagnosis not present

## 2022-03-26 DIAGNOSIS — L659 Nonscarring hair loss, unspecified: Secondary | ICD-10-CM

## 2022-03-26 DIAGNOSIS — E039 Hypothyroidism, unspecified: Secondary | ICD-10-CM | POA: Diagnosis not present

## 2022-03-26 DIAGNOSIS — E7800 Pure hypercholesterolemia, unspecified: Secondary | ICD-10-CM | POA: Diagnosis not present

## 2022-03-26 DIAGNOSIS — M0600 Rheumatoid arthritis without rheumatoid factor, unspecified site: Secondary | ICD-10-CM | POA: Diagnosis not present

## 2022-03-26 DIAGNOSIS — K219 Gastro-esophageal reflux disease without esophagitis: Secondary | ICD-10-CM | POA: Diagnosis not present

## 2022-03-26 DIAGNOSIS — Z8673 Personal history of transient ischemic attack (TIA), and cerebral infarction without residual deficits: Secondary | ICD-10-CM | POA: Diagnosis not present

## 2022-03-26 LAB — TSH: TSH: 5.47 u[IU]/mL (ref 0.35–5.50)

## 2022-03-26 NOTE — Progress Notes (Signed)
Patient ID: Donna Rhodes, female   DOB: [birth year], 76 y.o.   MRN: [id number] ? ? ?Subjective:  ? ? Patient ID: Donna Rhodes, female    DOB: [birth year], 76 y.o.   MRN: [id number] ? ?This visit occurred during the SARS-CoV-2 public health emergency.  Safety protocols were in place, including screening questions prior to the visit, additional usage of staff PPE, and extensive cleaning of exam room while observing appropriate contact time as indicated for disinfecting solutions.  ? ?Patient here for a scheduled follow up.  ? ?Chief Complaint  ?Patient presents with  ? discuss labs  ? .  ? ?HPI ?Was scheduled for physical.  Appt changed to follow up appt.  Still with increased stress.  Discussed.  Overall appears to be handling things relatively well.  Tries to stay active.  No chest pain or sob reported.  No acid reflux.  No abdominal pain.  Due to f/u with dermatology for lesion adjacent to eye.  Concern regarding hair thinning.  Request thyroid check.  Discussed labs.   ? ? ?Past Medical History:  ?Diagnosis Date  ? Atrophic vaginitis   ? Dysplasia of vagina   ? vin 1 - vulva and vaginal cuff  ? GERD (gastroesophageal reflux disease)   ? HCV infection   ? History of basal cell carcinoma (BCC) 04/11/2019  ? right lat lower eyelid margin Mohs 2020 Dr. Lacinda Axon  ? HSV (herpes simplex virus) infection   ? Hypercholesterolemia   ? Hypertension   ? Hypothyroidism   ? Left-sided weakness   ? 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  PT 2x/wk.  ? Lung nodule   ? stable  ? Menopausal state   ? Osteoarthritis   ? hand involvement, cervical disc disease  ? Osteoporosis   ? Pelvic pain in female   ? Rheumatoid arthritis ([place])   ? 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 Thyroid nodule   ? pt unaware of this   ? Vaginal Pap smear, abnormal   ? lgsil  ? ?Past Surgical History:  ?Procedure Laterality Date  ? ADENOIDECTOMY    ? APPENDECTOMY  1965  ? [place]  ? left  ? CATARACT EXTRACTION W/PHACO Right 10/27/2017  ? Procedure: CATARACT EXTRACTION  PHACO AND INTRAOCULAR LENS PLACEMENT ([place]) RIGHT;  Surgeon: Leandrew Koyanagi, MD;  Location: [place];  Service: Ophthalmology;  Laterality: Right;  requests early (8-8:30 arrival)  ? CATARACT EXTRACTION W/PHACO Left 11/10/2017  ? Procedure: CATARACT EXTRACTION PHACO AND INTRAOCULAR LENS PLACEMENT ([place]) LEFT;  Surgeon: Leandrew Koyanagi, MD;  Location: [place];  Service: Ophthalmology;  Laterality: Left;  ? CHOLECYSTECTOMY    ? [place]  ? laser vein surgery    ? LOOP RECORDER INSERTION N/A 09/24/2017  ? Procedure: LOOP RECORDER INSERTION;  Surgeon: Evans Lance, MD;  Location: [place] [facility];  Service: Cardiovascular;  Laterality: N/A;  ? [place]  ? TOENAIL EXCISION  1998  ? several  ? TONSILLECTOMY    ? TUBAL LIGATION  1977  ? VAGINAL HYSTERECTOMY  1983  ? als0- anterior/posterior colporrhaphy  ? VAGINAL WOUND CLOSURE / [place], 2000  ? VAGINAL WOUND CLOSURE / REPAIR    ? ?Family History  ?Problem Relation Age of Onset  ? Hypertension Father   ? Dementia Father   ? Osteoarthritis Mother   ? Arthritis/Rheumatoid Sister   ? Breast cancer Other   ?     paternal side  ? Breast cancer  Paternal Aunt   ? Breast cancer Cousin   ?     maternal  ? Uterine cancer Maternal Grandmother   ? Ovarian cancer Neg Hx   ? Heart disease Neg Hx   ? Diabetes Neg Hx   ? Colon cancer Neg Hx   ? ?Social History  ? ?Socioeconomic History  ? Marital status: Divorced  ?  Spouse name: Not on file  ? Number of children: 3  ? Years of education: 30  ? Highest education level: Not on file  ?Occupational History  ?  Comment: retired  ?Tobacco Use  ? Smoking status: Former  ?  Types: Cigarettes  ?  Quit date: 12/29/1979  ?  Years since quitting: 42.2  ? Smokeless tobacco: Never  ?Vaping Use  ? Vaping Use: Never used  ?Substance and Sexual Activity  ? Alcohol use: No  ?  Alcohol/week: 0.0 standard drinks  ? Drug use: No  ? Sexual activity: Not Currently  ?  Birth  control/protection: Surgical  ?Other Topics Concern  ? Not on file  ?Social History Narrative  ? Her mother and her daughter live with her  ? Caffeine- 1 cup coffee  ? ?Social Determinants of Health  ? ?Financial Resource Strain: Not on file  ?Food Insecurity: Not on file  ?Transportation Needs: Not on file  ?Physical Activity: Not on file  ?Stress: Not on file  ?Social Connections: Not on file  ? ? ? ?Review of Systems  ?Constitutional:  Negative for appetite change and unexpected weight change.  ?HENT:  Negative for congestion and sinus pressure.   ?Respiratory:  Negative for cough, chest tightness and shortness of breath.   ?Cardiovascular:  Negative for chest pain, palpitations and leg swelling.  ?Gastrointestinal:  Negative for abdominal pain, diarrhea, nausea and vomiting.  ?Genitourinary:  Negative for difficulty urinating and dysuria.  ?Musculoskeletal:  Negative for joint swelling and myalgias.  ?Skin:  Negative for color change and rash.  ?Neurological:  Negative for dizziness, light-headedness and headaches.  ?Psychiatric/Behavioral:  Negative for agitation and dysphoric mood.   ? ?   ?Objective:  ?  ? ?BP 118/78 (BP Location: Right Arm, Patient Position: Sitting, Cuff Size: Small)   Pulse 78   Temp 97.9 ?F (36.6 ?C) (Oral)   Ht '5\' 4"'$  (1.626 m)   Wt 110 lb 6.4 oz (50.1 kg)   LMP 12/29/1981   SpO2 99%   BMI 18.95 kg/m?  ?Wt Readings from Last 3 Encounters:  ?03/26/22 110 lb 6.4 oz (50.1 kg)  ?02/10/22 107 lb 12.8 oz (48.9 kg)  ?01/29/22 108 lb 6.4 oz (49.2 kg)  ? ? ?Physical Exam ?Vitals reviewed.  ?Constitutional:   ?   General: She is not in acute distress. ?   Appearance: Normal appearance.  ?HENT:  ?   Head: Normocephalic and atraumatic.  ?   Right Ear: External ear normal.  ?   Left Ear: External ear normal.  ?Eyes:  ?   General: No scleral icterus.    ?   Right eye: No discharge.     ?   Left eye: No discharge.  ?   Conjunctiva/sclera: Conjunctivae normal.  ?Neck:  ?   Thyroid: No thyromegaly.   ?Cardiovascular:  ?   Rate and Rhythm: Normal rate and regular rhythm.  ?Pulmonary:  ?   Effort: No respiratory distress.  ?   Breath sounds: Normal breath sounds. No wheezing.  ?Abdominal:  ?   General: Bowel sounds are normal.  ?  Palpations: Abdomen is soft.  ?   Tenderness: There is no abdominal tenderness.  ?Musculoskeletal:     ?   General: No swelling or tenderness.  ?   Cervical back: Neck supple. No tenderness.  ?Lymphadenopathy:  ?   Cervical: No cervical adenopathy.  ?Skin: ?   Findings: No erythema or rash.  ?Neurological:  ?   Mental Status: She is alert.  ?Psychiatric:     ?   Mood and Affect: Mood normal.     ?   Behavior: Behavior normal.  ? ? ? ?Outpatient Encounter Medications as of 03/26/2022  ?Medication Sig  ? acetaminophen (TYLENOL) 500 MG tablet Take 1-2 tablets (500-1,000 mg total) by mouth every 6 (six) hours as needed for mild pain.  ? acyclovir (ZOVIRAX) 200 MG capsule Take 1 capsule (200 mg total) by mouth 2 (two) times daily.  ? amLODipine (NORVASC) 2.5 MG tablet TAKE 1 TABLET BY MOUTH DAILY  ? atorvastatin (LIPITOR) 20 MG tablet TAKE 1 TABLET BY MOUTH DAILY  ? calcium-vitamin D (OSCAL WITH D) 250-125 MG-UNIT tablet Take 1 tablet by mouth daily.  ? clopidogrel (PLAVIX) 75 MG tablet TAKE 1 TABLET BY MOUTH DAILY  ? folic acid (FOLVITE) 1 MG tablet Take 1 mg by mouth daily.  ? ketoconazole (NIZORAL) 2 % shampoo Apply 1 application topically 2 (two) times a week. massage into scalp and leave in for 5 - 10 minutes before rinsing out  ? levothyroxine (SYNTHROID) 50 MCG tablet TAKE 1 TABLET BY MOUTH DAILY  ? methotrexate 2.5 MG tablet Take 10 mg by mouth once a week.  ? nitrofurantoin, macrocrystal-monohydrate, (MACROBID) 100 MG capsule Take 1 capsule (100 mg total) by mouth 2 (two) times daily. X 5 days  ? pantoprazole (PROTONIX) 20 MG tablet TAKE 1 TABLET BY MOUTH DAILY  ? [DISCONTINUED] ketoconazole (NIZORAL) 2 % cream Apply 1 application topically daily. (Patient not taking: Reported on  03/26/2022)  ? ?No facility-administered encounter medications on file as of 03/26/2022.  ?  ? ?Lab Results  ?Component Value Date  ? WBC 6.9 11/17/2021  ? HGB 12.2 11/17/2021  ? HCT 37.4 11/17/2021  ? PLT 272.0 11/17/2021  ? G

## 2022-03-27 ENCOUNTER — Encounter: Payer: Self-pay | Admitting: Internal Medicine

## 2022-03-27 DIAGNOSIS — L659 Nonscarring hair loss, unspecified: Secondary | ICD-10-CM | POA: Insufficient documentation

## 2022-03-27 NOTE — Assessment & Plan Note (Signed)
Request tsh to be checked.  ?

## 2022-03-27 NOTE — Assessment & Plan Note (Signed)
Continue plavix and lipitor.  Residual effects as outlined.  ?

## 2022-03-27 NOTE — Assessment & Plan Note (Signed)
Has been followed by gyn.  Recently evaluated by Dr Amalia Hailey.  Per gyn: Abnormal Paps of the vaginal cuff discussed were discussed. Patient previously had LGSIL but a negative colposcopy.  Based on her low risk they discussed the possibility of a repeat Pap or simply considering her aged out of Pap smears because her risk of vaginal cancer is very low.  Per review of note, they reported she would like consider these options.  Discussed f/u pap today.  She will notify gyn if desires.  ?

## 2022-03-27 NOTE — Assessment & Plan Note (Signed)
Continue protonix.  No acid reflux reported.  Follow.  

## 2022-03-27 NOTE — Assessment & Plan Note (Signed)
On MTX.  Seeing Dr Jefm Bryant. Last evaluated 12/25/21.  Stable.  ?

## 2022-03-27 NOTE — Assessment & Plan Note (Signed)
Still some limitations.  Continue exercise.  Follow.  

## 2022-03-27 NOTE — Assessment & Plan Note (Signed)
Discussed labs and low B12.  Discussed need for B12 injections.  Agreeable to start - 1018mg q week x 4 weeks and then 10044m q month.  ?

## 2022-03-27 NOTE — Assessment & Plan Note (Signed)
On thyroid replacement.  Follow tsh.  

## 2022-03-27 NOTE — Assessment & Plan Note (Signed)
Continue amlodipine.  Blood pressure as outlined.  Follow pressures.  Follow metabolic panel.  

## 2022-03-27 NOTE — Assessment & Plan Note (Signed)
Continue lipitor.  Follow lipid panel and liver function tests.   

## 2022-03-30 ENCOUNTER — Other Ambulatory Visit: Payer: Self-pay

## 2022-03-31 ENCOUNTER — Encounter: Payer: PPO | Admitting: Obstetrics and Gynecology

## 2022-03-31 DIAGNOSIS — Z124 Encounter for screening for malignant neoplasm of cervix: Secondary | ICD-10-CM

## 2022-04-02 ENCOUNTER — Ambulatory Visit (INDEPENDENT_AMBULATORY_CARE_PROVIDER_SITE_OTHER): Payer: PPO

## 2022-04-02 DIAGNOSIS — E538 Deficiency of other specified B group vitamins: Secondary | ICD-10-CM | POA: Diagnosis not present

## 2022-04-02 MED ORDER — CYANOCOBALAMIN 1000 MCG/ML IJ SOLN
1000.0000 ug | Freq: Once | INTRAMUSCULAR | Status: AC
  Administered 2022-04-02: 1000 ug via INTRAMUSCULAR

## 2022-04-02 NOTE — Progress Notes (Signed)
Pt arrived for 1/4 B12 injection, given in R deltoid. Pt tolerated injection well showed no signs of distress nor voiced any concerns.  ?

## 2022-04-09 ENCOUNTER — Ambulatory Visit (INDEPENDENT_AMBULATORY_CARE_PROVIDER_SITE_OTHER): Payer: PPO

## 2022-04-09 DIAGNOSIS — E538 Deficiency of other specified B group vitamins: Secondary | ICD-10-CM | POA: Diagnosis not present

## 2022-04-09 MED ORDER — CYANOCOBALAMIN 1000 MCG/ML IJ SOLN
1000.0000 ug | Freq: Once | INTRAMUSCULAR | Status: AC
  Administered 2022-04-09: 1000 ug via INTRAMUSCULAR

## 2022-04-09 NOTE — Progress Notes (Signed)
Donna Rhodes presents today for injection per MD orders. ?B12 injection administered IM in right Upper Arm. ?Administration without incident. ?Patient tolerated well. ?Injection  Jaiyden Laur,cma  ?

## 2022-04-10 ENCOUNTER — Other Ambulatory Visit: Payer: Self-pay | Admitting: Internal Medicine

## 2022-04-16 ENCOUNTER — Ambulatory Visit (INDEPENDENT_AMBULATORY_CARE_PROVIDER_SITE_OTHER): Payer: PPO

## 2022-04-16 DIAGNOSIS — E538 Deficiency of other specified B group vitamins: Secondary | ICD-10-CM

## 2022-04-16 MED ORDER — CYANOCOBALAMIN 1000 MCG/ML IJ SOLN
1000.0000 ug | Freq: Once | INTRAMUSCULAR | Status: AC
  Administered 2022-04-16: 1000 ug via INTRAMUSCULAR

## 2022-04-16 NOTE — Progress Notes (Signed)
Donna Rhodes presents today for injection per MD orders. ?B12 injection administered IM in right Upper Arm. ?Administration without incident. ?Patient tolerated well.  Sherrine Salberg,cma ? ?

## 2022-04-21 ENCOUNTER — Ambulatory Visit (INDEPENDENT_AMBULATORY_CARE_PROVIDER_SITE_OTHER): Payer: PPO | Admitting: Internal Medicine

## 2022-04-21 ENCOUNTER — Other Ambulatory Visit: Payer: Self-pay | Admitting: Internal Medicine

## 2022-04-21 ENCOUNTER — Encounter: Payer: Self-pay | Admitting: Internal Medicine

## 2022-04-21 VITALS — BP 132/68 | HR 66 | Temp 97.4°F | Ht 64.0 in | Wt 110.8 lb

## 2022-04-21 DIAGNOSIS — R300 Dysuria: Secondary | ICD-10-CM | POA: Diagnosis not present

## 2022-04-21 DIAGNOSIS — N3001 Acute cystitis with hematuria: Secondary | ICD-10-CM | POA: Diagnosis not present

## 2022-04-21 DIAGNOSIS — I10 Essential (primary) hypertension: Secondary | ICD-10-CM

## 2022-04-21 LAB — URINALYSIS, ROUTINE W REFLEX MICROSCOPIC
Bilirubin Urine: NEGATIVE
Ketones, ur: NEGATIVE
Nitrite: NEGATIVE
Specific Gravity, Urine: <=1.005 — AB (ref 1.000–1.030)
Total Protein, Urine: NEGATIVE
Urine Glucose: NEGATIVE
Urobilinogen, UA: 0.2 (ref 0.0–1.0)
pH: 6 (ref 5.0–8.0)

## 2022-04-21 MED ORDER — NITROFURANTOIN MONOHYD MACRO 100 MG PO CAPS: 100.0000 mg | ORAL_CAPSULE | Freq: Two times a day (BID) | ORAL | 0 refills | Status: DC

## 2022-04-21 NOTE — Progress Notes (Signed)
Patient ID: Donna Rhodes, female   DOB: February 26, 1946, 76 y.o.   MRN: [id number] ? ? ?Subjective:  ? ? Patient ID: Donna Rhodes, female    DOB: [birth year], 76 y.o.   MRN: [id number] ? ?This visit occurred during the SARS-CoV-2 public health emergency.  Safety protocols were in place, including screening questions prior to the visit, additional usage of staff PPE, and extensive cleaning of exam room while observing appropriate contact time as indicated for disinfecting solutions.  ? ?Patient here for work in appt.  ? ?Chief Complaint  ?Patient presents with  ? Urinary Tract Infection  ?  Pain with urination  ? .  ? ?HPI ?Work in with concerns regarding possible UTI.  Noticed symptoms - one week ago.  Some dysuria.  No fever.  No nausea or vomiting.  No vaginal symptoms.  No abdominal pain or back pain.  No blood in urine.  Dysuria.  Taking AZO/cranberry.   ? ? ?Past Medical History:  ?Diagnosis Date  ? Atrophic vaginitis   ? Dysplasia of vagina   ? vin 1 - vulva and vaginal cuff  ? GERD (gastroesophageal reflux disease)   ? HCV infection   ? History of basal cell carcinoma (BCC) 04/11/2019  ? right lat lower eyelid margin Mohs 2020 Dr. Lacinda Axon  ? HSV (herpes simplex virus) infection   ? Hypercholesterolemia   ? Hypertension   ? Hypothyroidism   ? Left-sided weakness   ? 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  PT 2x/wk.  ? Lung nodule   ? stable  ? Menopausal state   ? Osteoarthritis   ? hand involvement, cervical disc disease  ? Osteoporosis   ? Pelvic pain in female   ? Rheumatoid arthritis ([place])   ? Stroke (cerebrum) ([place]) 04/24/2017  ? Thyroid nodule   ? pt unaware of this   ? Vaginal Pap smear, abnormal   ? lgsil  ? ?Past Surgical History:  ?Procedure Laterality Date  ? ADENOIDECTOMY    ? APPENDECTOMY  1965  ? [place]  ? left  ? CATARACT EXTRACTION W/PHACO Right 10/27/2017  ? Procedure: CATARACT EXTRACTION PHACO AND INTRAOCULAR LENS PLACEMENT ([place]) RIGHT;  Surgeon: Leandrew Koyanagi, MD;  Location: [place];   Service: Ophthalmology;  Laterality: Right;  requests early (8-8:30 arrival)  ? CATARACT EXTRACTION W/PHACO Left 11/10/2017  ? Procedure: CATARACT EXTRACTION PHACO AND INTRAOCULAR LENS PLACEMENT ([place]) LEFT;  Surgeon: Leandrew Koyanagi, MD;  Location: [place];  Service: Ophthalmology;  Laterality: Left;  ? CHOLECYSTECTOMY    ? [place]  ? laser vein surgery    ? LOOP RECORDER INSERTION N/A 09/24/2017  ? Procedure: LOOP RECORDER INSERTION;  Surgeon: Evans Lance, MD;  Location: [place] [facility];  Service: Cardiovascular;  Laterality: N/A;  ? [place]  ? TOENAIL EXCISION  1998  ? several  ? TONSILLECTOMY    ? TUBAL LIGATION  1977  ? VAGINAL HYSTERECTOMY  1983  ? als0- anterior/posterior colporrhaphy  ? VAGINAL WOUND CLOSURE / [place], 2000  ? VAGINAL WOUND CLOSURE / REPAIR    ? ?Family History  ?Problem Relation Age of Onset  ? Hypertension Father   ? Dementia Father   ? Osteoarthritis Mother   ? Arthritis/Rheumatoid Sister   ? Breast cancer Other   ?     paternal side  ? Breast cancer Paternal Aunt   ? Breast cancer Cousin   ?     maternal  ?  Uterine cancer Maternal Grandmother   ? Ovarian cancer Neg Hx   ? Heart disease Neg Hx   ? Diabetes Neg Hx   ? Colon cancer Neg Hx   ? ?Social History  ? ?Socioeconomic History  ? Marital status: Divorced  ?  Spouse name: Not on file  ? Number of children: 3  ? Years of education: 43  ? Highest education level: Not on file  ?Occupational History  ?  Comment: retired  ?Tobacco Use  ? Smoking status: Former  ?  Types: Cigarettes  ?  Quit date: 12/29/1979  ?  Years since quitting: 42.3  ? Smokeless tobacco: Never  ?Vaping Use  ? Vaping Use: Never used  ?Substance and Sexual Activity  ? Alcohol use: No  ?  Alcohol/week: 0.0 standard drinks  ? Drug use: No  ? Sexual activity: Not Currently  ?  Birth control/protection: Surgical  ?Other Topics Concern  ? Not on file  ?Social History Narrative  ? Her mother and her daughter live  with her  ? Caffeine- 1 cup coffee  ? ?Social Determinants of Health  ? ?Financial Resource Strain: Not on file  ?Food Insecurity: Not on file  ?Transportation Needs: Not on file  ?Physical Activity: Not on file  ?Stress: Not on file  ?Social Connections: Not on file  ? ? ? ?Review of Systems  ?Constitutional:  Negative for appetite change and fever.  ?Respiratory:  Negative for cough, chest tightness and shortness of breath.   ?Cardiovascular:  Negative for chest pain, palpitations and leg swelling.  ?Gastrointestinal:  Negative for abdominal pain, diarrhea, nausea and vomiting.  ?Genitourinary:  Positive for dysuria. Negative for difficulty urinating.  ?Musculoskeletal:  Negative for back pain and myalgias.  ?Skin:  Negative for color change and rash.  ?Neurological:  Negative for dizziness, light-headedness and headaches.  ?Psychiatric/Behavioral:  Negative for agitation and dysphoric mood.   ? ?   ?Objective:  ?  ? ?BP 132/68 (BP Location: Right Arm, Patient Position: Sitting, Cuff Size: Normal)   Pulse 66   Temp (!) 97.4 ?F (36.3 ?C) (Oral)   Ht '5\' 4"'$  (1.626 m)   Wt 110 lb 12.8 oz (50.3 kg)   LMP 12/29/1981   SpO2 98%   BMI 19.02 kg/m?  ?Wt Readings from Last 3 Encounters:  ?04/21/22 110 lb 12.8 oz (50.3 kg)  ?03/26/22 110 lb 6.4 oz (50.1 kg)  ?02/10/22 107 lb 12.8 oz (48.9 kg)  ? ? ?Physical Exam ?Vitals reviewed.  ?Constitutional:   ?   General: She is not in acute distress. ?   Appearance: Normal appearance.  ?HENT:  ?   Head: Normocephalic and atraumatic.  ?   Right Ear: External ear normal.  ?   Left Ear: External ear normal.  ?Eyes:  ?   General: No scleral icterus.    ?   Right eye: No discharge.     ?   Left eye: No discharge.  ?   Conjunctiva/sclera: Conjunctivae normal.  ?Neck:  ?   Thyroid: No thyromegaly.  ?Cardiovascular:  ?   Rate and Rhythm: Normal rate and regular rhythm.  ?Pulmonary:  ?   Effort: No respiratory distress.  ?   Breath sounds: Normal breath sounds. No wheezing.  ?Abdominal:   ?   General: Bowel sounds are normal.  ?   Palpations: Abdomen is soft.  ?   Tenderness: There is no abdominal tenderness.  ?Musculoskeletal:     ?   General: No swelling  or tenderness.  ?   Cervical back: Neck supple. No tenderness.  ?Lymphadenopathy:  ?   Cervical: No cervical adenopathy.  ?Skin: ?   Findings: No erythema or rash.  ?Neurological:  ?   Mental Status: She is alert.  ?Psychiatric:     ?   Mood and Affect: Mood normal.     ?   Behavior: Behavior normal.  ? ? ? ?Outpatient Encounter Medications as of 04/21/2022  ?Medication Sig  ? acetaminophen (TYLENOL) 500 MG tablet Take 1-2 tablets (500-1,000 mg total) by mouth every 6 (six) hours as needed for mild pain.  ? amLODipine (NORVASC) 2.5 MG tablet TAKE 1 TABLET BY MOUTH DAILY  ? atorvastatin (LIPITOR) 20 MG tablet TAKE 1 TABLET BY MOUTH DAILY  ? calcium-vitamin D (OSCAL WITH D) 250-125 MG-UNIT tablet Take 1 tablet by mouth daily.  ? clopidogrel (PLAVIX) 75 MG tablet TAKE 1 TABLET BY MOUTH DAILY  ? folic acid (FOLVITE) 1 MG tablet Take 1 mg by mouth daily.  ? ketoconazole (NIZORAL) 2 % shampoo Apply 1 application topically 2 (two) times a week. massage into scalp and leave in for 5 - 10 minutes before rinsing out  ? levothyroxine (SYNTHROID) 50 MCG tablet TAKE 1 TABLET BY MOUTH DAILY  ? methotrexate 2.5 MG tablet Take 10 mg by mouth once a week.  ? nitrofurantoin, macrocrystal-monohydrate, (MACROBID) 100 MG capsule Take 1 capsule (100 mg total) by mouth 2 (two) times daily.  ? pantoprazole (PROTONIX) 20 MG tablet TAKE 1 TABLET BY MOUTH DAILY  ? [DISCONTINUED] acyclovir (ZOVIRAX) 200 MG capsule Take 1 capsule (200 mg total) by mouth 2 (two) times daily.  ? [DISCONTINUED] nitrofurantoin, macrocrystal-monohydrate, (MACROBID) 100 MG capsule Take 1 capsule (100 mg total) by mouth 2 (two) times daily. X 5 days (Patient not taking: Reported on 04/21/2022)  ? ?No facility-administered encounter medications on file as of 04/21/2022.  ?  ? ?Lab Results  ?Component  Value Date  ? WBC 6.9 11/17/2021  ? HGB 12.2 11/17/2021  ? HCT 37.4 11/17/2021  ? PLT 272.0 11/17/2021  ? GLUCOSE 85 03/23/2022  ? CHOL 148 03/23/2022  ? TRIG 56.0 03/23/2022  ? HDL 65.90 03/23/2022  ? LDLDIRECT 12

## 2022-04-23 LAB — URINE CULTURE
MICRO NUMBER:: 13309210
SPECIMEN QUALITY:: ADEQUATE

## 2022-04-24 ENCOUNTER — Telehealth: Payer: Self-pay

## 2022-04-24 NOTE — Telephone Encounter (Signed)
Pt advised of results. 

## 2022-04-24 NOTE — Telephone Encounter (Signed)
Patient called to request lab results.

## 2022-05-02 ENCOUNTER — Encounter: Payer: Self-pay | Admitting: Internal Medicine

## 2022-05-02 NOTE — Assessment & Plan Note (Signed)
Symptoms and urine dip c/w UTI.  Treat with macrobid as directed.  Probiotics as directed.  Await urine culture.  Stay hydrated.  Call if problems.  ?

## 2022-05-02 NOTE — Assessment & Plan Note (Signed)
Continue amlodipine.  Blood pressure as outlined.  Follow pressures.  Follow metabolic panel.  

## 2022-05-07 ENCOUNTER — Ambulatory Visit: Payer: PPO | Admitting: Dermatology

## 2022-05-19 ENCOUNTER — Ambulatory Visit (INDEPENDENT_AMBULATORY_CARE_PROVIDER_SITE_OTHER): Payer: PPO

## 2022-05-19 ENCOUNTER — Telehealth: Payer: Self-pay

## 2022-05-19 DIAGNOSIS — E538 Deficiency of other specified B group vitamins: Secondary | ICD-10-CM | POA: Diagnosis not present

## 2022-05-19 MED ORDER — CYANOCOBALAMIN 1000 MCG/ML IJ SOLN
1000.0000 ug | Freq: Once | INTRAMUSCULAR | Status: AC
  Administered 2022-05-19: 1000 ug via INTRAMUSCULAR

## 2022-05-19 NOTE — Progress Notes (Signed)
Pt presented today for b12 injection. Right deltoid, IM. Pt voiced no concerns nor showed any signs of distress during injection.  

## 2022-05-19 NOTE — Telephone Encounter (Signed)
Patient states she had to change her appointment for her physical from 07/01/2022 to 07/31/2022 and would like to know if Einar Pheasant, MD, would like for her to come in for labs closer to her new appointment date.  Patient requested a call back.

## 2022-05-20 ENCOUNTER — Other Ambulatory Visit: Payer: Self-pay

## 2022-05-20 DIAGNOSIS — E7800 Pure hypercholesterolemia, unspecified: Secondary | ICD-10-CM

## 2022-05-20 DIAGNOSIS — E039 Hypothyroidism, unspecified: Secondary | ICD-10-CM

## 2022-05-20 DIAGNOSIS — I10 Essential (primary) hypertension: Secondary | ICD-10-CM

## 2022-05-20 DIAGNOSIS — D649 Anemia, unspecified: Secondary | ICD-10-CM

## 2022-05-20 DIAGNOSIS — R7989 Other specified abnormal findings of blood chemistry: Secondary | ICD-10-CM

## 2022-05-20 NOTE — Telephone Encounter (Signed)
Lm for pt to cb to sched labs

## 2022-05-21 ENCOUNTER — Other Ambulatory Visit: Payer: Self-pay

## 2022-05-21 NOTE — Telephone Encounter (Signed)
Orders placed. Sched by [employer]

## 2022-06-10 ENCOUNTER — Other Ambulatory Visit: Payer: Self-pay | Admitting: Internal Medicine

## 2022-06-11 ENCOUNTER — Ambulatory Visit (INDEPENDENT_AMBULATORY_CARE_PROVIDER_SITE_OTHER): Payer: PPO | Admitting: Internal Medicine

## 2022-06-11 ENCOUNTER — Ambulatory Visit
Admission: RE | Admit: 2022-06-11 | Discharge: 2022-06-11 | Disposition: A | Discharge status: Home / Self Care | Payer: PPO | Source: Ambulatory Visit | Attending: Internal Medicine | Admitting: Internal Medicine

## 2022-06-11 ENCOUNTER — Encounter: Payer: Self-pay | Admitting: Internal Medicine

## 2022-06-11 VITALS — BP 120/74 | HR 76 | Temp 97.9°F | Resp 14 | Ht 64.0 in | Wt 108.0 lb

## 2022-06-11 DIAGNOSIS — W19XXXA Unspecified fall, initial encounter: Secondary | ICD-10-CM | POA: Insufficient documentation

## 2022-06-11 DIAGNOSIS — G44309 Post-traumatic headache, unspecified, not intractable: Secondary | ICD-10-CM

## 2022-06-11 DIAGNOSIS — E538 Deficiency of other specified B group vitamins: Secondary | ICD-10-CM | POA: Diagnosis not present

## 2022-06-11 DIAGNOSIS — S0990XA Unspecified injury of head, initial encounter: Secondary | ICD-10-CM | POA: Diagnosis not present

## 2022-06-11 DIAGNOSIS — R519 Headache, unspecified: Secondary | ICD-10-CM | POA: Insufficient documentation

## 2022-06-11 DIAGNOSIS — I639 Cerebral infarction, unspecified: Secondary | ICD-10-CM | POA: Diagnosis not present

## 2022-06-11 DIAGNOSIS — K219 Gastro-esophageal reflux disease without esophagitis: Secondary | ICD-10-CM | POA: Diagnosis not present

## 2022-06-11 DIAGNOSIS — Z8673 Personal history of transient ischemic attack (TIA), and cerebral infarction without residual deficits: Secondary | ICD-10-CM | POA: Diagnosis not present

## 2022-06-11 DIAGNOSIS — G8320 Monoplegia of upper limb affecting unspecified side: Secondary | ICD-10-CM | POA: Diagnosis not present

## 2022-06-11 DIAGNOSIS — I10 Essential (primary) hypertension: Secondary | ICD-10-CM | POA: Diagnosis not present

## 2022-06-11 DIAGNOSIS — Y939 Activity, unspecified: Secondary | ICD-10-CM | POA: Insufficient documentation

## 2022-06-11 DIAGNOSIS — E7800 Pure hypercholesterolemia, unspecified: Secondary | ICD-10-CM | POA: Diagnosis not present

## 2022-06-11 DIAGNOSIS — E039 Hypothyroidism, unspecified: Secondary | ICD-10-CM | POA: Diagnosis not present

## 2022-06-11 DIAGNOSIS — M0600 Rheumatoid arthritis without rheumatoid factor, unspecified site: Secondary | ICD-10-CM | POA: Diagnosis not present

## 2022-06-11 NOTE — Progress Notes (Unsigned)
Patient ID: Donna Rhodes, female   DOB: 1946-02-21, 76 y.o.   MRN: [id number]   Subjective:    Patient ID: Donna Rhodes, female    DOB: [birth year], 76 y.o.   MRN: [id number]  This visit occurred during the SARS-CoV-2 public health emergency.  Safety protocols were in place, including screening questions prior to the visit, additional usage of staff PPE, and extensive cleaning of exam room while observing appropriate contact time as indicated for disinfecting solutions.   Patient here for  Chief Complaint  Patient presents with   Follow-up   Fall   .   HPI    Past Medical History:  Diagnosis Date   Atrophic vaginitis    Dysplasia of vagina    vin 1 - vulva and vaginal cuff   GERD (gastroesophageal reflux disease)    HCV infection    History of basal cell carcinoma (BCC) 04/11/2019   right lat lower eyelid margin Mohs 2020 Dr. Lacinda Axon   HSV (herpes simplex virus) infection    Hypercholesterolemia    Hypertension    Hypothyroidism    Left-sided weakness    S/P CVA 4/18.  PT 2x/wk.   Lung nodule    stable   Menopausal state    Osteoarthritis    hand involvement, cervical disc disease   Osteoporosis    Pelvic pain in female    Rheumatoid arthritis ([place])    Stroke (cerebrum) ([place]) 04/24/2017   Thyroid nodule    pt unaware of this    Vaginal Pap smear, abnormal    lgsil   Past Surgical History:  Procedure Laterality Date   [place]   left   CATARACT EXTRACTION W/PHACO Right 10/27/2017   Procedure: CATARACT EXTRACTION PHACO AND INTRAOCULAR LENS PLACEMENT ([place]) RIGHT;  Surgeon: Leandrew Koyanagi, MD;  Location: [facility];  Service: Ophthalmology;  Laterality: Right;  requests early (8-8:30 arrival)   CATARACT EXTRACTION W/PHACO Left 11/10/2017   Procedure: CATARACT EXTRACTION PHACO AND INTRAOCULAR LENS PLACEMENT ([place]) LEFT;  Surgeon: Leandrew Koyanagi, MD;  Location: [facility];  Service:  Ophthalmology;  Laterality: Left;   CHOLECYSTECTOMY     FINGER SURGERY  1982   laser vein surgery     LOOP RECORDER INSERTION N/A 09/24/2017   Procedure: LOOP RECORDER INSERTION;  Surgeon: Evans Lance, MD;  Location: [facility];  Service: Cardiovascular;  Laterality: N/A;   MANDIBLE RECONSTRUCTION  1992   TOENAIL EXCISION  1998   several   TONSILLECTOMY     TUBAL LIGATION  1977   VAGINAL HYSTERECTOMY  1983   als0- anterior/posterior colporrhaphy   VAGINAL WOUND CLOSURE / REPAIR  1999, 2000   VAGINAL WOUND CLOSURE / REPAIR     Family History  Problem Relation Age of Onset   Hypertension Father    Dementia Father    Osteoarthritis Mother    Arthritis/Rheumatoid Sister    Breast cancer Other        paternal side   Breast cancer Paternal Aunt    Breast cancer Cousin        maternal   Uterine cancer Maternal Grandmother    Ovarian cancer Neg Hx    Heart disease Neg Hx    Diabetes Neg Hx    Colon cancer Neg Hx    Social History   Socioeconomic History   Marital status: Divorced    Spouse name: Not on file   Number  of children: 3   Years of education: 12   Highest education level: Not on file  Occupational History    Comment: retired  Tobacco Use   Smoking status: Former    Types: Cigarettes    Quit date: 12/29/1979    Years since quitting: 42.4   Smokeless tobacco: Never  Vaping Use   Vaping Use: Never used  Substance and Sexual Activity   Alcohol use: No    Alcohol/week: 0.0 standard drinks of alcohol   Drug use: No   Sexual activity: Not Currently    Birth control/protection: Surgical  Other Topics Concern   Not on file  Social History Narrative   Her mother and her daughter live with her   Caffeine- 1 cup coffee   Social Determinants of Health   Financial Resource Strain: Low Risk  (02/24/2021)   Overall Financial Resource Strain (CARDIA)    Difficulty of Paying Living Expenses: Not hard at all  Food Insecurity: No Food Insecurity (02/24/2021)    Hunger Vital Sign    Worried About Running Out of Food in the Last Year: Never true    Ran Out of Food in the Last Year: Never true  Transportation Needs: No Transportation Needs (02/24/2021)   PRAPARE - Transportation    Lack of Transportation (Medical): No    Lack of Transportation (Non-Medical): No  Physical Activity: Not on file  Stress: No Stress Concern Present (02/24/2021)   [place]    Feeling of Stress : Only a little  Social Connections: Unknown (02/24/2021)   Social Connection and Isolation Panel [NHANES]    Frequency of Communication with Friends and Family: More than three times a week    Frequency of Social Gatherings with Friends and Family: More than three times a week    Attends Religious Services: Not on Advertising copywriter or Organizations: Not on file    Attends Archivist Meetings: Not on file    Marital Status: Not on file     Review of Systems     Objective:     BP 120/74 (BP Location: Left Arm, Patient Position: Sitting, Cuff Size: Small)   Pulse 76   Temp 97.9 F (36.6 C) (Temporal)   Resp 14   Ht '5\' 4"'$  (1.626 m)   Wt 108 lb (49 kg)   LMP 12/29/1981   SpO2 97%   BMI 18.54 kg/m  Wt Readings from Last 3 Encounters:  06/11/22 108 lb (49 kg)  04/21/22 110 lb 12.8 oz (50.3 kg)  03/26/22 110 lb 6.4 oz (50.1 kg)    Physical Exam   Outpatient Encounter Medications as of 06/11/2022  Medication Sig   acetaminophen (TYLENOL) 500 MG tablet Take 1-2 tablets (500-1,000 mg total) by mouth every 6 (six) hours as needed for mild pain.   acyclovir (ZOVIRAX) 200 MG capsule TAKE 1 CAPSULE BY MOUTH TWICE DAILY   amLODipine (NORVASC) 2.5 MG tablet TAKE 1 TABLET BY MOUTH DAILY   atorvastatin (LIPITOR) 20 MG tablet TAKE 1 TABLET BY MOUTH DAILY   calcium-vitamin D (OSCAL WITH D) 250-125 MG-UNIT tablet Take 1 tablet by mouth daily.   clopidogrel (PLAVIX) 75 MG tablet TAKE 1  TABLET BY MOUTH DAILY   folic acid (FOLVITE) 1 MG tablet Take 1 mg by mouth daily.   ketoconazole (NIZORAL) 2 % shampoo Apply 1 application topically 2 (two) times a week. massage into scalp and leave in for  5 - 10 minutes before rinsing out   levothyroxine (SYNTHROID) 50 MCG tablet TAKE 1 TABLET BY MOUTH DAILY   methotrexate 2.5 MG tablet Take 10 mg by mouth once a week.   pantoprazole (PROTONIX) 20 MG tablet TAKE 1 TABLET BY MOUTH DAILY   nitrofurantoin, macrocrystal-monohydrate, (MACROBID) 100 MG capsule Take 1 capsule (100 mg total) by mouth 2 (two) times daily. (Patient not taking: Reported on 06/11/2022)   No facility-administered encounter medications on file as of 06/11/2022.     Lab Results  Component Value Date   WBC 6.9 11/17/2021   HGB 12.2 11/17/2021   HCT 37.4 11/17/2021   PLT 272.0 11/17/2021   GLUCOSE 85 03/23/2022   CHOL 148 03/23/2022   TRIG 56.0 03/23/2022   HDL 65.90 03/23/2022   LDLDIRECT 128.0 05/25/2013   LDLCALC 70 03/23/2022   ALT 21 03/23/2022   AST 24 03/23/2022   NA 142 03/23/2022   K 4.3 03/23/2022   CL 108 03/23/2022   CREATININE 0.67 03/23/2022   BUN 14 03/23/2022   CO2 30 03/23/2022   TSH 5.47 03/26/2022   HGBA1C 5.7 (H) 04/25/2017    MM 3D SCREEN BREAST BILATERAL  Result Date: 10/23/2021 CLINICAL DATA:  Screening. EXAM: DIGITAL SCREENING BILATERAL MAMMOGRAM WITH TOMOSYNTHESIS AND CAD TECHNIQUE: Bilateral screening digital craniocaudal and mediolateral oblique mammograms were obtained. Bilateral screening digital breast tomosynthesis was performed. The images were evaluated with computer-aided detection. COMPARISON:  Previous exam(s). ACR Breast Density Category c: The breast tissue is heterogeneously dense, which may obscure small masses. FINDINGS: There are no findings suspicious for malignancy. IMPRESSION: No mammographic evidence of malignancy. A result letter of this screening mammogram will be mailed directly to the patient. RECOMMENDATION:  Screening mammogram in one year. (Code:SM-B-01Y) BI-RADS CATEGORY  1: Negative. Electronically Signed   By: Fidela Salisbury M.D.   On: 10/23/2021 15:53      Assessment & Plan:   Problem List Items Addressed This Visit   None    Einar Pheasant, MD

## 2022-06-16 ENCOUNTER — Encounter: Payer: Self-pay | Admitting: Internal Medicine

## 2022-06-16 DIAGNOSIS — W19XXXA Unspecified fall, initial encounter: Secondary | ICD-10-CM | POA: Insufficient documentation

## 2022-06-16 NOTE — Assessment & Plan Note (Signed)
On thyroid replacement.  Follow tsh.  

## 2022-06-16 NOTE — Assessment & Plan Note (Signed)
Continue amlodipine.  Blood pressure as outlined.  Follow pressures.  Follow metabolic panel.  

## 2022-06-16 NOTE — Assessment & Plan Note (Signed)
Continue plavix and lipitor.  Residual effects as outlined.

## 2022-06-16 NOTE — Assessment & Plan Note (Signed)
Continue B12 injections.   

## 2022-06-16 NOTE — Assessment & Plan Note (Signed)
Still some limitations.  Continue exercise.  Follow.  

## 2022-06-16 NOTE — Assessment & Plan Note (Signed)
Continue lipitor.  Follow lipid panel and liver function tests.   

## 2022-06-16 NOTE — Assessment & Plan Note (Signed)
Continue protonix.  No acid reflux reported.  Follow.  

## 2022-06-16 NOTE — Assessment & Plan Note (Signed)
On MTX.  Seeing Dr Jefm Bryant. Stable.

## 2022-06-16 NOTE — Assessment & Plan Note (Signed)
Recent fall as outlined.  Hit chin/left side head.  Some pain posteriorly.  Will check CT head.  Overall doing better.  No increased dizziness.

## 2022-06-17 DIAGNOSIS — Z79899 Other long term (current) drug therapy: Secondary | ICD-10-CM | POA: Diagnosis not present

## 2022-06-17 DIAGNOSIS — M0609 Rheumatoid arthritis without rheumatoid factor, multiple sites: Secondary | ICD-10-CM | POA: Diagnosis not present

## 2022-06-23 ENCOUNTER — Ambulatory Visit (INDEPENDENT_AMBULATORY_CARE_PROVIDER_SITE_OTHER): Payer: PPO

## 2022-06-23 DIAGNOSIS — E538 Deficiency of other specified B group vitamins: Secondary | ICD-10-CM

## 2022-06-23 MED ORDER — CYANOCOBALAMIN 1000 MCG/ML IJ SOLN
1000.0000 ug | Freq: Once | INTRAMUSCULAR | Status: AC
  Administered 2022-06-23: 1000 ug via INTRAMUSCULAR

## 2022-06-24 DIAGNOSIS — M0609 Rheumatoid arthritis without rheumatoid factor, multiple sites: Secondary | ICD-10-CM | POA: Diagnosis not present

## 2022-06-24 DIAGNOSIS — M19042 Primary osteoarthritis, left hand: Secondary | ICD-10-CM | POA: Diagnosis not present

## 2022-06-24 DIAGNOSIS — Z79899 Other long term (current) drug therapy: Secondary | ICD-10-CM | POA: Diagnosis not present

## 2022-06-24 DIAGNOSIS — M19041 Primary osteoarthritis, right hand: Secondary | ICD-10-CM | POA: Diagnosis not present

## 2022-06-25 ENCOUNTER — Other Ambulatory Visit: Payer: Self-pay | Admitting: Internal Medicine

## 2022-07-01 ENCOUNTER — Encounter: Payer: PPO | Admitting: Internal Medicine

## 2022-07-18 ENCOUNTER — Other Ambulatory Visit: Payer: Self-pay | Admitting: Internal Medicine

## 2022-07-29 ENCOUNTER — Other Ambulatory Visit (INDEPENDENT_AMBULATORY_CARE_PROVIDER_SITE_OTHER): Payer: PPO

## 2022-07-29 DIAGNOSIS — D649 Anemia, unspecified: Secondary | ICD-10-CM

## 2022-07-29 DIAGNOSIS — I10 Essential (primary) hypertension: Secondary | ICD-10-CM

## 2022-07-29 DIAGNOSIS — E7800 Pure hypercholesterolemia, unspecified: Secondary | ICD-10-CM | POA: Diagnosis not present

## 2022-07-29 DIAGNOSIS — R7989 Other specified abnormal findings of blood chemistry: Secondary | ICD-10-CM | POA: Diagnosis not present

## 2022-07-29 DIAGNOSIS — E039 Hypothyroidism, unspecified: Secondary | ICD-10-CM

## 2022-07-29 LAB — BASIC METABOLIC PANEL
BUN: 16 mg/dL (ref 6–23)
CO2: 28 mEq/L (ref 19–32)
Calcium: 9.5 mg/dL (ref 8.4–10.5)
Chloride: 105 mEq/L (ref 96–112)
Creatinine, Ser: 0.77 mg/dL (ref 0.40–1.20)
GFR: 74.78 mL/min (ref >60.00)
Glucose, Bld: 77 mg/dL (ref 70–99)
Potassium: 4.4 mEq/L (ref 3.5–5.1)
Sodium: 141 mEq/L (ref 135–145)

## 2022-07-29 LAB — HEPATIC FUNCTION PANEL
ALT: 16 U/L (ref 0–35)
AST: 20 U/L (ref 0–37)
Albumin: 4.5 g/dL (ref 3.5–5.2)
Alkaline Phosphatase: 84 U/L (ref 39–117)
Bilirubin, Direct: 0.1 mg/dL (ref 0.0–0.3)
Total Bilirubin: 0.6 mg/dL (ref 0.2–1.2)
Total Protein: 7 g/dL (ref 6.0–8.3)

## 2022-07-29 LAB — CBC WITH DIFFERENTIAL/PLATELET
Basophils Absolute: 0.1 10*3/uL (ref 0.0–0.1)
Basophils Relative: 2.1 % (ref 0.0–3.0)
Eosinophils Absolute: 0.2 10*3/uL (ref 0.0–0.7)
Eosinophils Relative: 4 % (ref 0.0–5.0)
HCT: 39.1 % (ref 36.0–46.0)
Hemoglobin: 12.8 g/dL (ref 12.0–15.0)
Lymphocytes Relative: 27.5 % (ref 12.0–46.0)
Lymphs Abs: 1.2 10*3/uL (ref 0.7–4.0)
MCHC: 32.7 g/dL (ref 30.0–36.0)
MCV: 96 fl (ref 78.0–100.0)
Monocytes Absolute: 0.5 10*3/uL (ref 0.1–1.0)
Monocytes Relative: 10.8 % (ref 3.0–12.0)
Neutro Abs: 2.4 10*3/uL (ref 1.4–7.7)
Neutrophils Relative %: 55.6 % (ref 43.0–77.0)
Platelets: 222 10*3/uL (ref 150.0–400.0)
RBC: 4.08 Mil/uL (ref 3.87–5.11)
RDW: 15.2 % (ref 11.5–15.5)
WBC: 4.4 10*3/uL (ref 4.0–10.5)

## 2022-07-29 LAB — LIPID PANEL
Cholesterol: 169 mg/dL (ref 0–200)
HDL: 65.6 mg/dL (ref >39.00)
LDL Cholesterol: 85 mg/dL (ref 0–99)
NonHDL: 103.66
Total CHOL/HDL Ratio: 3
Triglycerides: 93 mg/dL (ref 0.0–149.0)
VLDL: 18.6 mg/dL (ref 0.0–40.0)

## 2022-07-29 LAB — TSH: TSH: 1.98 u[IU]/mL (ref 0.35–5.50)

## 2022-07-31 ENCOUNTER — Encounter: Payer: Self-pay | Admitting: Internal Medicine

## 2022-07-31 ENCOUNTER — Ambulatory Visit (INDEPENDENT_AMBULATORY_CARE_PROVIDER_SITE_OTHER): Payer: PPO | Admitting: Internal Medicine

## 2022-07-31 DIAGNOSIS — E039 Hypothyroidism, unspecified: Secondary | ICD-10-CM

## 2022-07-31 DIAGNOSIS — K219 Gastro-esophageal reflux disease without esophagitis: Secondary | ICD-10-CM | POA: Diagnosis not present

## 2022-07-31 DIAGNOSIS — M0600 Rheumatoid arthritis without rheumatoid factor, unspecified site: Secondary | ICD-10-CM

## 2022-07-31 DIAGNOSIS — Z0000 Encounter for general adult medical examination without abnormal findings: Secondary | ICD-10-CM

## 2022-07-31 DIAGNOSIS — G8320 Monoplegia of upper limb affecting unspecified side: Secondary | ICD-10-CM

## 2022-07-31 DIAGNOSIS — F320 Major depressive disorder, single episode, mild: Secondary | ICD-10-CM | POA: Diagnosis not present

## 2022-07-31 DIAGNOSIS — R7989 Other specified abnormal findings of blood chemistry: Secondary | ICD-10-CM

## 2022-07-31 DIAGNOSIS — Z8673 Personal history of transient ischemic attack (TIA), and cerebral infarction without residual deficits: Secondary | ICD-10-CM

## 2022-07-31 DIAGNOSIS — E7800 Pure hypercholesterolemia, unspecified: Secondary | ICD-10-CM | POA: Diagnosis not present

## 2022-07-31 DIAGNOSIS — I10 Essential (primary) hypertension: Secondary | ICD-10-CM | POA: Diagnosis not present

## 2022-07-31 MED ORDER — REPATHA 140 MG/ML ~~LOC~~ SOSY: PREFILLED_SYRINGE | SUBCUTANEOUS | 2 refills | Status: DC

## 2022-07-31 NOTE — Progress Notes (Signed)
Patient ID: JAEDIN TRUMBO, female   DOB: [birth year], 76 y.o.   MRN: [id number]   Subjective:    Patient ID: [name], female    DOB: Oct 08, 1946, 76 y.o.   MRN: [id number]   Patient here for a scheduled physical exam.    Chief Complaint  Patient presents with   Follow-up    Yearly   .   HPI Recently had f/u with rheumatology for RA (06/24/22).  On MTX.  Discussed the need to take folic acid.  She has not been taking.  Persistent pain - hands, wrist and knees.  Had hand xrays.  She feels the cholesterol medication she is on is contributing to some of her aches/pains.  Would like to come off the medication.  Discussed other treatment options - including repatha. Discussed importance of treating cholesterol given her history of CVA.  No chest pain.  Breathing stable.  No abdominal pain.  Taking stool softener.     Past Medical History:  Diagnosis Date   Atrophic vaginitis    Dysplasia of vagina    vin 1 - vulva and vaginal cuff   GERD (gastroesophageal reflux disease)    HCV infection    History of basal cell carcinoma (BCC) 04/11/2019   right lat lower eyelid margin Mohs 2020 Dr. Lacinda Axon   HSV (herpes simplex virus) infection    Hypercholesterolemia    Hypertension    Hypothyroidism    Left-sided weakness    S/P CVA 4/18.  PT 2x/wk.   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/24/2017   Thyroid nodule    pt unaware of this    Vaginal Pap smear, abnormal    lgsil   Past Surgical History:  Procedure Laterality Date   [place]   left   CATARACT EXTRACTION W/PHACO Right 10/27/2017   Procedure: CATARACT EXTRACTION PHACO AND INTRAOCULAR LENS PLACEMENT ([place]) RIGHT;  Surgeon: Leandrew Koyanagi, MD;  Location: [facility];  Service: Ophthalmology;   Laterality: Right;  requests early (8-8:30 arrival)   CATARACT EXTRACTION W/PHACO Left 11/10/2017   Procedure: CATARACT EXTRACTION PHACO AND INTRAOCULAR LENS PLACEMENT ([place]) LEFT;  Surgeon: Leandrew Koyanagi, MD;  Location: [facility];  Service: Ophthalmology;  Laterality: Left;   CHOLECYSTECTOMY     FINGER SURGERY  1982   laser vein surgery     LOOP RECORDER INSERTION N/A 09/24/2017   Procedure: LOOP RECORDER INSERTION;  Surgeon: Evans Lance, MD;  Location: [facility];  Service: Cardiovascular;  Laterality: N/A;   MANDIBLE RECONSTRUCTION  1992   TOENAIL EXCISION  1998   several   TONSILLECTOMY     TUBAL LIGATION  1977   VAGINAL HYSTERECTOMY  1983   als0- anterior/posterior colporrhaphy   VAGINAL WOUND CLOSURE / REPAIR  1999, 2000   VAGINAL WOUND CLOSURE / REPAIR     Family History  Problem Relation Age of Onset   Hypertension Father    Dementia Father    Osteoarthritis Mother    Arthritis/Rheumatoid Sister    Breast cancer Other        paternal side   Breast cancer Paternal Aunt    Breast cancer Cousin        maternal  Uterine cancer Maternal Grandmother    Ovarian cancer Neg Hx    Heart disease Neg Hx    Diabetes Neg Hx    Colon cancer Neg Hx    Social History   Socioeconomic History   Marital status: Divorced    Spouse name: Not on file   Number of children: 3   Years of education: 12   Highest education level: Not on file  Occupational History    Comment: retired  Tobacco Use   Smoking status: Former    Types: Cigarettes    Quit date: 12/29/1979    Years since quitting: 42.6   Smokeless tobacco: Never  Vaping Use   Vaping Use: Never used  Substance and Sexual Activity   Alcohol use: No    Alcohol/week: 0.0 standard drinks of alcohol   Drug use: No   Sexual activity: Not Currently    Birth control/protection: Surgical  Other Topics Concern   Not on file  Social History Narrative   Her mother and her daughter live with her    Caffeine- 1 cup coffee   Social Determinants of Health   Financial Resource Strain: Low Risk  (02/24/2021)   Overall Financial Resource Strain (CARDIA)    Difficulty of Paying Living Expenses: Not hard at all  Food Insecurity: No Food Insecurity (02/24/2021)   Hunger Vital Sign    Worried About Running Out of Food in the Last Year: Never true    [place] in the Last Year: Never true  Transportation Needs: No Transportation Needs (02/24/2021)   PRAPARE - Hydrologist (Medical): No    Lack of Transportation (Non-Medical): No  Physical Activity: Not on file  Stress: No Stress Concern Present (02/24/2021)   [place]    Feeling of Stress : Only a little  Social Connections: Unknown (02/24/2021)   Social Connection and Isolation Panel [NHANES]    Frequency of Communication with Friends and Family: More than three times a week    Frequency of Social Gatherings with Friends and Family: More than three times a week    Attends Religious Services: Not on Advertising copywriter or Organizations: Not on file    Attends Archivist Meetings: Not on file    Marital Status: Not on file     Review of Systems  Constitutional:  Negative for appetite change and unexpected weight change.  HENT:  Negative for congestion, sinus pressure and sore throat.   Eyes:  Negative for pain and visual disturbance.  Respiratory:  Negative for cough, chest tightness and shortness of breath.   Cardiovascular:  Negative for chest pain, palpitations and leg swelling.  Gastrointestinal:  Negative for abdominal pain, diarrhea, nausea and vomiting.  Genitourinary:  Negative for difficulty urinating and dysuria.  Musculoskeletal:  Negative for joint swelling and myalgias.  Skin:  Negative for color change and rash.  Neurological:  Negative for dizziness, light-headedness and headaches.  Hematological:   Negative for adenopathy. Does not bruise/bleed easily.  Psychiatric/Behavioral:  Negative for agitation and dysphoric mood.        Increased stress.         Objective:     BP 120/68 (BP Location: Left Arm, Patient Position: Sitting, Cuff Size: Small)   Pulse 81   Temp 98.4 F (36.9 C) (Temporal)   Resp 19   Ht '5\' 4"'$  (1.626 m)   Wt 106  lb (48.1 kg)   LMP 12/29/1981   SpO2 99%   BMI 18.19 kg/m  Wt Readings from Last 3 Encounters:  07/31/22 106 lb (48.1 kg)  06/11/22 108 lb (49 kg)  04/21/22 110 lb 12.8 oz (50.3 kg)    Physical Exam Vitals reviewed.  Constitutional:      General: She is not in acute distress.    Appearance: Normal appearance.  HENT:     Head: Normocephalic and atraumatic.     Right Ear: External ear normal.     Left Ear: External ear normal.  Eyes:     General: No scleral icterus.       Right eye: No discharge.        Left eye: No discharge.     Conjunctiva/sclera: Conjunctivae normal.  Neck:     Thyroid: No thyromegaly.  Cardiovascular:     Rate and Rhythm: Normal rate and regular rhythm.  Pulmonary:     Effort: No respiratory distress.     Breath sounds: Normal breath sounds. No wheezing.  Abdominal:     General: Bowel sounds are normal.     Palpations: Abdomen is soft.     Tenderness: There is no abdominal tenderness.  Musculoskeletal:        General: No swelling or tenderness.     Cervical back: Neck supple. No tenderness.  Lymphadenopathy:     Cervical: No cervical adenopathy.  Skin:    Findings: No erythema or rash.  Neurological:     Mental Status: She is alert.  Psychiatric:        Mood and Affect: Mood normal.        Behavior: Behavior normal.      Outpatient Encounter Medications as of 07/31/2022  Medication Sig   acetaminophen (TYLENOL) 500 MG tablet Take 1-2 tablets (500-1,000 mg total) by mouth every 6 (six) hours as needed for mild pain.   acyclovir (ZOVIRAX) 200 MG capsule TAKE 1 CAPSULE BY MOUTH TWICE DAILY    amLODipine (NORVASC) 2.5 MG tablet TAKE 1 TABLET BY MOUTH DAILY   calcium-vitamin D (OSCAL WITH D) 250-125 MG-UNIT tablet Take 1 tablet by mouth daily.   clopidogrel (PLAVIX) 75 MG tablet TAKE 1 TABLET BY MOUTH DAILY   Evolocumab (REPATHA) 140 MG/ML SOSY One injection q 2 weeks.   folic acid (FOLVITE) 1 MG tablet Take 1 mg by mouth daily.   ketoconazole (NIZORAL) 2 % shampoo Apply 1 application topically 2 (two) times a week. massage into scalp and leave in for 5 - 10 minutes before rinsing out   levothyroxine (SYNTHROID) 50 MCG tablet TAKE 1 TABLET BY MOUTH DAILY   methotrexate 2.5 MG tablet Take 10 mg by mouth once a week.   pantoprazole (PROTONIX) 20 MG tablet TAKE 1 TABLET BY MOUTH DAILY   [DISCONTINUED] atorvastatin (LIPITOR) 20 MG tablet TAKE 1 TABLET BY MOUTH DAILY   No facility-administered encounter medications on file as of 07/31/2022.     Lab Results  Component Value Date   WBC 4.4 07/29/2022   HGB 12.8 07/29/2022   HCT 39.1 07/29/2022   PLT 222.0 07/29/2022   GLUCOSE 77 07/29/2022   CHOL 169 07/29/2022   TRIG 93.0 07/29/2022   HDL 65.60 07/29/2022   LDLDIRECT 128.0 05/25/2013   LDLCALC 85 07/29/2022   ALT 16 07/29/2022   AST 20 07/29/2022   NA 141 07/29/2022   K 4.4 07/29/2022   CL 105 07/29/2022   CREATININE 0.77 07/29/2022   BUN 16 07/29/2022  CO2 28 07/29/2022   TSH 1.98 07/29/2022   HGBA1C 5.7 (H) 04/25/2017    CT HEAD WO CONTRAST (5MM)  Result Date: 06/11/2022 CLINICAL DATA:  Head trauma, moderate-severe EXAM: CT HEAD WITHOUT CONTRAST TECHNIQUE: Contiguous axial images were obtained from the base of the skull through the vertex without intravenous contrast. RADIATION DOSE REDUCTION: This exam was performed according to the departmental dose-optimization program which includes automated exposure control, adjustment of the mA and/or kV according to patient size and/or use of iterative reconstruction technique. COMPARISON:  MRI head April 25, 2021. FINDINGS:  Brain: No evidence of acute large vascular territory infarction, hemorrhage, hydrocephalus, extra-axial collection or mass lesion/mass effect. Remote perforator infarct in the right basal ganglia. Vascular: No hyperdense vessel identified. Skull: No acute fracture. Sinuses/Orbits: Clear visualized sinuses. No acute orbital findings. Other: No mastoid effusions. IMPRESSION: 1. No evidence of acute intracranial abnormality. 2. Remote right basal ganglia perforator infarct. Electronically Signed   By: Margaretha Sheffield M.D.   On: 06/11/2022 16:27       Assessment & Plan:   Problem List Items Addressed This Visit     Abnormal liver function test    Previous ultrasound revealed fatty liver.  Recent liver panel wnl.        Essential hypertension    Continue amlodipine.  Blood pressure as outlined.  Follow pressures.  Follow metabolic panel.       Relevant Medications   Evolocumab (REPATHA) 140 MG/ML SOSY   Other Relevant Orders   Basic metabolic panel   Flaccid monoplegia of upper extremity (HCC)    Still some limitations.  Continue exercise.  Follow.       GERD (gastroesophageal reflux disease)    Continue protonix.  No acid reflux reported.  Follow.       Health care maintenance    Was scheduled for a physical today 07/31/22).  Declined breast exam.  PAP previously through gyn.  Mammogram 10/21/21 - birads I.  Colonoscopy 09/2013 - tortuous colon.  Recommended f/u in 10 years.       History of CVA (cerebrovascular accident)    Continue plavix. Has been on lipitor.  Feels is contributing to her aching and soreness. Discussed importance of treating her cholesterol given previous stroke and risk factors.  Discussed repatha.  She is agreeable.  Follow lipid panel.       Hypercholesterolemia    Change to repatha as outlined - due to concern regarding increased pain and aching from statin medication.  Follow lipid panel.      Relevant Medications   Evolocumab (REPATHA) 140 MG/ML SOSY    Other Relevant Orders   Hepatic function panel   Lipid panel   Hypothyroidism, unspecified    On thyroid replacement.  Follow tsh.       Major depressive disorder, single episode, mild ([place])    Overall she feels she is handling things relatively well.  Follow.       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)    On MTX.  Seeing rheumatology (Dr DeFoor).  Discussed the need to take folic acid.        Relevant Orders   CBC with Differential/Platelet     Einar Pheasant, MD

## 2022-08-02 ENCOUNTER — Encounter: Payer: Self-pay | Admitting: Internal Medicine

## 2022-08-02 NOTE — Assessment & Plan Note (Signed)
On MTX.  Seeing rheumatology (Dr DeFoor).  Discussed the need to take folic acid.

## 2022-08-02 NOTE — Assessment & Plan Note (Signed)
On thyroid replacement.  Follow tsh.  

## 2022-08-02 NOTE — Assessment & Plan Note (Signed)
Continue plavix. Has been on lipitor.  Feels is contributing to her aching and soreness. Discussed importance of treating her cholesterol given previous stroke and risk factors.  Discussed repatha.  She is agreeable.  Follow lipid panel.

## 2022-08-02 NOTE — Assessment & Plan Note (Signed)
Overall she feels she is handling things relatively well.  Follow.

## 2022-08-02 NOTE — Assessment & Plan Note (Signed)
Still some limitations.  Continue exercise.  Follow.  

## 2022-08-02 NOTE — Assessment & Plan Note (Signed)
Change to repatha as outlined - due to concern regarding increased pain and aching from statin medication.  Follow lipid panel.

## 2022-08-02 NOTE — Assessment & Plan Note (Signed)
Continue amlodipine.  Blood pressure as outlined.  Follow pressures.  Follow metabolic panel.  

## 2022-08-02 NOTE — Assessment & Plan Note (Signed)
Continue protonix.  No acid reflux reported.  Follow.  

## 2022-08-02 NOTE — Assessment & Plan Note (Signed)
Was scheduled for a physical today 07/31/22).  Declined breast exam.  PAP previously through gyn.  Mammogram 10/21/21 - birads I.  Colonoscopy 09/2013 - tortuous colon.  Recommended f/u in 10 years.

## 2022-08-02 NOTE — Assessment & Plan Note (Signed)
Previous ultrasound revealed fatty liver.  Recent liver panel wnl.   

## 2022-08-06 ENCOUNTER — Telehealth: Payer: Self-pay

## 2022-08-06 NOTE — Telephone Encounter (Signed)
[facility] [place] ([facility]) Rx #: [id number] Repatha SureClick '140MG'$ /ML auto-injectors   Form [employer] [employer] Electronic Prior Authorization Form 2017 NCPDP Created 6 days ago Sent to Plan 4 minutes ago Plan Response 4 minutes ago Submit Clinical Questions less than a minute ago Determination Wait for Determination Please wait for [place] 2017 to return a determination.

## 2022-08-07 NOTE — Telephone Encounter (Signed)
[facility] [place] ([facility]) Rx #: [id number] Repatha SureClick '140MG'$ /ML auto-injectors   Form [employer] Electronic Prior Authorization Form 2017 NCPDP Created 7 days ago Sent to Plan 1 day ago Plan Response 1 day ago Submit Clinical Questions 1 day ago Determination Favorable 1 day ago Message from Plan 10-AUG-23:10-FEB-24 Quantity:;Repatha SureClick '140MG'$ /ML [place] SOAJ Quantity:2; Quantity:;  APPROVED

## 2022-08-12 ENCOUNTER — Other Ambulatory Visit: Payer: Self-pay | Admitting: Internal Medicine

## 2022-08-24 ENCOUNTER — Ambulatory Visit: Payer: PPO | Admitting: Dermatology

## 2022-09-07 DIAGNOSIS — Z961 Presence of intraocular lens: Secondary | ICD-10-CM | POA: Diagnosis not present

## 2022-09-30 ENCOUNTER — Other Ambulatory Visit: Payer: Self-pay | Admitting: Internal Medicine

## 2022-10-14 ENCOUNTER — Other Ambulatory Visit: Payer: Self-pay | Admitting: Internal Medicine

## 2022-10-14 ENCOUNTER — Ambulatory Visit: Payer: PPO | Admitting: Dermatology

## 2022-10-14 DIAGNOSIS — Z1231 Encounter for screening mammogram for malignant neoplasm of breast: Secondary | ICD-10-CM

## 2022-10-15 ENCOUNTER — Other Ambulatory Visit: Payer: Self-pay | Admitting: Internal Medicine

## 2022-10-21 ENCOUNTER — Ambulatory Visit (INDEPENDENT_AMBULATORY_CARE_PROVIDER_SITE_OTHER): Payer: PPO

## 2022-10-21 DIAGNOSIS — Z23 Encounter for immunization: Secondary | ICD-10-CM | POA: Diagnosis not present

## 2022-10-22 DIAGNOSIS — M0609 Rheumatoid arthritis without rheumatoid factor, multiple sites: Secondary | ICD-10-CM | POA: Diagnosis not present

## 2022-10-22 DIAGNOSIS — Z79899 Other long term (current) drug therapy: Secondary | ICD-10-CM | POA: Diagnosis not present

## 2022-10-27 ENCOUNTER — Encounter: Payer: Self-pay | Admitting: Internal Medicine

## 2022-10-27 ENCOUNTER — Other Ambulatory Visit: Payer: Self-pay | Admitting: Internal Medicine

## 2022-10-27 ENCOUNTER — Ambulatory Visit: Payer: PPO | Attending: Internal Medicine | Admitting: Internal Medicine

## 2022-10-27 ENCOUNTER — Ambulatory Visit (INDEPENDENT_AMBULATORY_CARE_PROVIDER_SITE_OTHER): Payer: PPO

## 2022-10-27 VITALS — BP 126/70 | HR 59 | Ht 64.0 in | Wt 104.4 lb

## 2022-10-27 DIAGNOSIS — I10 Essential (primary) hypertension: Secondary | ICD-10-CM

## 2022-10-27 DIAGNOSIS — I639 Cerebral infarction, unspecified: Secondary | ICD-10-CM

## 2022-10-27 DIAGNOSIS — R002 Palpitations: Secondary | ICD-10-CM

## 2022-10-27 MED ORDER — METOPROLOL SUCCINATE ER 50 MG PO TB24: 75.0000 mg | ORAL_TABLET | Freq: Every day | ORAL | 5 refills | Status: DC

## 2022-10-27 NOTE — Progress Notes (Signed)
HPI Donna Rhodes returns today for followup after undergoing ILR removal a couple of years ago. 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 She initially had an ILR inserted but had no evidence of atrial fib. She remains quite anxious and worried about having another stroke. She notes occaisional palpitations which were due to PVC's and PAC's when she had an ILR. She wants to know if she needs to continue the plavix. She is anxious about her palpitations.  Allergies  Allergen Reactions   Amoxicillin Other (See Comments)    Has patient had a PCN reaction causing immediate rash, facial/tongue/throat swelling, SOB or lightheadedness with hypotension: No Has patient had a PCN reaction causing severe rash involving mucus membranes or skin necrosis: No Has patient had a PCN reaction that required hospitalization: No Has patient had a PCN reaction occurring within the last 10 years: No If all of the above answers are "NO", then may proceed with Cephalosporin use.   Irritated tongue   Avelox [Moxifloxacin Hcl In Nacl]     Tongue irritation   Azithromycin Other (See Comments)    Caused mouth and tongue to be sore   Bacitracin     Itching and rash    Codeine     "feels like I'm in a barrel"   [employer] [Nutritional Supplements]     Mouth soreness   Fish Oil     Other reaction(s): Other (See Comments) Mouth soreness Mouth soreness   Food     Walnuts --mouth breaks out   Moxifloxacin Other (See Comments)    Tongue irritation   Naproxen     Tongue irritation   Nsaids Other (See Comments)    Oral inflammation   Cefepime Rash     Current Outpatient Medications  Medication Sig Dispense Refill   acetaminophen (TYLENOL) 500 MG tablet Take 1-2 tablets (500-1,000 mg total) by mouth every 6 (six) hours as needed for mild pain. 30 tablet 0   acyclovir (ZOVIRAX) 200 MG capsule TAKE 1 CAPSULE  BY MOUTH TWICE DAILY 180 capsule 3   amLODipine (NORVASC) 2.5 MG tablet TAKE 1 TABLET BY MOUTH DAILY 90 tablet 1   atorvastatin (LIPITOR) 20 MG tablet TAKE 1 TABLET BY MOUTH DAILY 90 tablet 0   calcium-vitamin D (OSCAL WITH D) 250-125 MG-UNIT tablet Take 1 tablet by mouth daily.     clopidogrel (PLAVIX) 75 MG tablet TAKE 1 TABLET BY MOUTH DAILY 90 tablet 1   folic acid (FOLVITE) 1 MG tablet Take 1 mg by mouth daily.     ketoconazole (NIZORAL) 2 % shampoo Apply 1 application topically 2 (two) times a week. massage into scalp and leave in for 5 - 10 minutes before rinsing out 120 mL 12   levothyroxine (SYNTHROID) 50 MCG tablet TAKE 1 TABLET BY MOUTH DAILY 90 tablet 0   methotrexate 2.5 MG tablet Take 10 mg by mouth once a week.     No current facility-administered medications for this visit.     Past Medical History:  Diagnosis Date   Atrophic vaginitis    Dysplasia of vagina    vin 1 - vulva and vaginal cuff   GERD (gastroesophageal reflux disease)    HCV infection    History of basal cell carcinoma (BCC) 04/11/2019   right lat lower eyelid margin Mohs 2020 Dr. Lacinda Axon   HSV (herpes simplex virus) infection  Hypercholesterolemia    Hypertension    Hypothyroidism    Left-sided weakness    S/P CVA 4/18.  PT 2x/wk.   Lung nodule    stable   Menopausal state    Osteoarthritis    hand involvement, cervical disc disease   Osteoporosis    Pelvic pain in female    Rheumatoid arthritis ([place])    Stroke (cerebrum) ([place]) 04/24/2017   Thyroid nodule    pt unaware of this    Vaginal Pap smear, abnormal    lgsil    ROS:   All systems reviewed and negative except as noted in the HPI.   Past Surgical History:  Procedure Laterality Date   [place]   left   CATARACT EXTRACTION W/PHACO Right 10/27/2017   Procedure: CATARACT EXTRACTION PHACO AND INTRAOCULAR LENS PLACEMENT ([place]) RIGHT;  Surgeon: Leandrew Koyanagi, MD;  Location:  [facility];  Service: Ophthalmology;  Laterality: Right;  requests early (8-8:30 arrival)   CATARACT EXTRACTION W/PHACO Left 11/10/2017   Procedure: CATARACT EXTRACTION PHACO AND INTRAOCULAR LENS PLACEMENT ([place]) LEFT;  Surgeon: Leandrew Koyanagi, MD;  Location: [facility];  Service: Ophthalmology;  Laterality: Left;   CHOLECYSTECTOMY     FINGER SURGERY  1982   laser vein surgery     LOOP RECORDER INSERTION N/A 09/24/2017   Procedure: LOOP RECORDER INSERTION;  Surgeon: Evans Lance, MD;  Location: [facility];  Service: Cardiovascular;  Laterality: N/A;   MANDIBLE RECONSTRUCTION  1992   TOENAIL EXCISION  1998   several   TONSILLECTOMY     TUBAL LIGATION  1977   VAGINAL HYSTERECTOMY  1983   als0- anterior/posterior colporrhaphy   VAGINAL WOUND CLOSURE / REPAIR  1999, 2000   VAGINAL WOUND CLOSURE / REPAIR       Family History  Problem Relation Age of Onset   Hypertension Father    Dementia Father    Osteoarthritis Mother    Arthritis/Rheumatoid Sister    Breast cancer Other        paternal side   Breast cancer Paternal Aunt    Breast cancer Cousin        maternal   Uterine cancer Maternal Grandmother    Ovarian cancer Neg Hx    Heart disease Neg Hx    Diabetes Neg Hx    Colon cancer Neg Hx      Social History   Socioeconomic History   Marital status: Divorced    Spouse name: Not on file   Number of children: 3   Years of education: 12   Highest education level: Not on file  Occupational History    Comment: retired  Tobacco Use   Smoking status: Former    Types: Cigarettes    Quit date: 12/29/1979    Years since quitting: 42.8   Smokeless tobacco: Never  Vaping Use   Vaping Use: Never used  Substance and Sexual Activity   Alcohol use: No    Alcohol/week: 0.0 standard drinks of alcohol   Drug use: No   Sexual activity: Not Currently    Birth control/protection: Surgical  Other Topics Concern   Not on file  Social History  Narrative   Her mother and her daughter live with her   Caffeine- 1 cup coffee   Social Determinants of Health   Financial Resource Strain: Low Risk  (02/24/2021)   Overall Financial Resource Strain (CARDIA)    Difficulty  of Paying Living Expenses: Not hard at all  Food Insecurity: No Food Insecurity (02/24/2021)   Hunger Vital Sign    Worried About Running Out of Food in the Last Year: Never true    Ran Out of Food in the Last Year: Never true  Transportation Needs: No Transportation Needs (02/24/2021)   PRAPARE - Hydrologist (Medical): No    Lack of Transportation (Non-Medical): No  Physical Activity: Not on file  Stress: No Stress Concern Present (02/24/2021)   [place]    Feeling of Stress : Only a little  Social Connections: Unknown (02/24/2021)   Social Connection and Isolation Panel [NHANES]    Frequency of Communication with Friends and Family: More than three times a week    Frequency of Social Gatherings with Friends and Family: More than three times a week    Attends Religious Services: Not on file    Active Member of [place] or Organizations: Not on file    Attends Archivist Meetings: Not on file    Marital Status: Not on file  Intimate Partner Violence: Not At Risk (02/24/2021)   Humiliation, Afraid, Rape, and Kick questionnaire    Fear of Current or Ex-Partner: No    Emotionally Abused: No    Physically Abused: No    Sexually Abused: No     BP 126/70   Pulse (!) 59   Ht '5\' 4"'$  (1.626 m)   Wt 104 lb 6.4 oz (47.4 kg)   LMP 12/29/1981   SpO2 97%   BMI 17.92 kg/m   Physical Exam:  Well appearing NAD HEENT: Unremarkable Neck:  No JVD, no thyromegally Lymphatics:  No adenopathy Back:  No CVA tenderness Lungs:  Clear with no wheezes HEART:  Regular rate rhythm, no murmurs, no rubs, no clicks Abd:  soft, positive bowel sounds, no organomegally, no rebound,  no guarding Ext:  2 plus pulses, no edema, no cyanosis, no clubbing Skin:  No rashes no nodules Neuro:  CN II through XII intact, motor grossly intact except for left HP  EKG - nsr  Assess/Plan:  1. Cryptogenic stroke - still no etiology. She will undergo watchful waiting. We discussed switching to ASA instead of plavix but for now I rec that she continue this medication.  2. PVC's - she will continue her current meds. She is quite anxious that her palpitations are worse and we have no diagnosis and I recommended she wear a 14 day zio monitor. 3. HTN - her bp is well controlled. She will continue her current meds. 4. Anxiety - she is a bit better today than prior visits. We will follow.   Mikle Bosworth.D.

## 2022-10-27 NOTE — Patient Instructions (Addendum)
Medication Instructions:  NO MEDICATION CHANGES HAVE BEEN ORDERED.     Lab Work: None ordered.  If you have labs (blood work) drawn today and your tests are completely normal, you will receive your results only by: [place] (if you have MyChart) OR A paper copy in the mail If you have any lab test that is abnormal or we need to change your treatment, we will call you to review the results.  Testing/Procedures: 14 DAY ZIO MONITOR ORDER BY DR. GREGG TAYLOR.   Follow-Up: At [facility], you and your health needs are our priority.  As part of our continuing mission to provide you with exceptional heart care, we have created designated Provider Care Teams.  These Care Teams include your primary Cardiologist (physician) and Advanced Practice Providers (APPs -  Physician Assistants and Nurse Practitioners) who all work together to provide you with the care you need, when you need it.  We recommend signing up for the patient portal called "MyChart".  Sign up information is provided on this After Visit Summary.  MyChart is used to connect with patients for Virtual Visits (Telemedicine).  Patients are able to view lab/test results, encounter notes, upcoming appointments, etc.  Non-urgent messages can be sent to your provider as well.   To learn more about what you can do with MyChart, go to [email or web address].    Your next appointment:   YOU WILL F/U WITH DR Lovena Le AFTER THE ZIO MONITOR RESULTS ARE RECEIVED.    The format for your next appointment:   In Person  Provider:   Cristopher Peru, MD{or one of the following Advanced Practice Providers on your designated Care Team:   Tommye Standard, [place] Legrand Como "Jonni Sanger" Tillery, PA-C   [place] Monitor Instructions  Your physician has requested you wear a ZIO patch monitor for 14 days.  This is a single patch monitor. [employer] supplies one patch monitor per enrollment. Additional stickers are not available. Please do not apply patch  if you will be having a Nuclear Stress Test,  Echocardiogram, Cardiac CT, MRI, or Chest Xray during the period you would be wearing the  monitor. The patch cannot be worn during these tests. You cannot remove and re-apply the  ZIO XT patch monitor.  Your ZIO patch monitor will be mailed 3 day USPS to your address on file. It may take 3-5 days  to receive your monitor after you have been enrolled.  Once you have received your monitor, please review the enclosed instructions. Your monitor  has already been registered assigning a specific monitor serial # to you.  Billing and Patient Assistance Program Information  We have supplied [employer] with any of your insurance information on file for billing purposes. [employer] offers a sliding scale Patient Assistance Program for patients that do not have  insurance, or whose insurance does not completely cover the cost of the ZIO monitor.  You must apply for the Patient Assistance Program to qualify for this discounted rate.  To apply, please call [employer] at [phone], select option 4, select option 2, ask to apply for  Patient Assistance Program. Theodore Demark will ask your household income, and how many people  are in your household. They will quote your out-of-pocket cost based on that information.  [employer] will also be able to set up a 68-month interest-free payment plan if needed.  Applying the monitor   Shave hair from upper left chest.  Hold abrader disc by orange tab. Rub abrader in 40 strokes over  the upper left chest as  indicated in your monitor instructions.  Clean area with 4 enclosed alcohol pads. Let dry.  Apply patch as indicated in monitor instructions. Patch will be placed under collarbone on left  side of chest with arrow pointing upward.  Rub patch adhesive wings for 2 minutes. Remove white label marked "1". Remove the white  label marked "2". Rub patch adhesive wings for 2 additional minutes.  While looking in a mirror, press and  release button in center of patch. A small green light will  flash 3-4 times. This will be your only indicator that the monitor has been turned on.  Do not shower for the first 24 hours. You may shower after the first 24 hours.  Press the button if you feel a symptom. You will hear a small click. Record Date, Time and  Symptom in the Patient Logbook.  When you are ready to remove the patch, follow instructions on the last 2 pages of Patient  Logbook. Stick patch monitor onto the last page of Patient Logbook.  Place Patient Logbook in the blue and white box. Use locking tab on box and tape box closed  securely. The blue and white box has prepaid postage on it. Please place it in the mailbox as  soon as possible. Your physician should have your test results approximately 7 days after the  monitor has been mailed back to [facility]  Call [place] at [phone] if you have questions regarding  your ZIO XT patch monitor. Call them immediately if you see an orange light blinking on your  monitor.  If your monitor falls off in less than 4 days, contact our Monitor department at [phone].  If your monitor becomes loose or falls off after 4 days call [employer] at [phone] for  suggestions on securing your monitor

## 2022-10-27 NOTE — Progress Notes (Unsigned)
Enrolled for [employer] to mail a ZIO XT long term holter monitor to the patients address on file.  

## 2022-11-03 ENCOUNTER — Other Ambulatory Visit (INDEPENDENT_AMBULATORY_CARE_PROVIDER_SITE_OTHER): Payer: PPO

## 2022-11-03 DIAGNOSIS — M0600 Rheumatoid arthritis without rheumatoid factor, unspecified site: Secondary | ICD-10-CM

## 2022-11-03 DIAGNOSIS — E7800 Pure hypercholesterolemia, unspecified: Secondary | ICD-10-CM

## 2022-11-03 DIAGNOSIS — I10 Essential (primary) hypertension: Secondary | ICD-10-CM

## 2022-11-03 LAB — CBC WITH DIFFERENTIAL/PLATELET
Basophils Absolute: 0.2 10*3/uL — ABNORMAL HIGH (ref 0.0–0.1)
Basophils Relative: 3.8 % — ABNORMAL HIGH (ref 0.0–3.0)
Eosinophils Absolute: 0.2 10*3/uL (ref 0.0–0.7)
Eosinophils Relative: 3.8 % (ref 0.0–5.0)
HCT: 37.9 % (ref 36.0–46.0)
Hemoglobin: 12.3 g/dL (ref 12.0–15.0)
Lymphocytes Relative: 32.9 % (ref 12.0–46.0)
Lymphs Abs: 1.6 10*3/uL (ref 0.7–4.0)
MCHC: 32.3 g/dL (ref 30.0–36.0)
MCV: 96.7 fl (ref 78.0–100.0)
Monocytes Absolute: 0.4 10*3/uL (ref 0.1–1.0)
Monocytes Relative: 8.7 % (ref 3.0–12.0)
Neutro Abs: 2.5 10*3/uL (ref 1.4–7.7)
Neutrophils Relative %: 50.8 % (ref 43.0–77.0)
Platelets: 241 10*3/uL (ref 150.0–400.0)
RBC: 3.92 Mil/uL (ref 3.87–5.11)
RDW: 14.7 % (ref 11.5–15.5)
WBC: 4.9 10*3/uL (ref 4.0–10.5)

## 2022-11-03 LAB — BASIC METABOLIC PANEL
BUN: 13 mg/dL (ref 6–23)
CO2: 28 mEq/L (ref 19–32)
Calcium: 9.4 mg/dL (ref 8.4–10.5)
Chloride: 108 mEq/L (ref 96–112)
Creatinine, Ser: 0.7 mg/dL (ref 0.40–1.20)
GFR: 83.68 mL/min (ref >60.00)
Glucose, Bld: 83 mg/dL (ref 70–99)
Potassium: 5.1 mEq/L (ref 3.5–5.1)
Sodium: 142 mEq/L (ref 135–145)

## 2022-11-03 LAB — LIPID PANEL
Cholesterol: 151 mg/dL (ref 0–200)
HDL: 65.1 mg/dL (ref >39.00)
LDL Cholesterol: 72 mg/dL (ref 0–99)
NonHDL: 85.79
Total CHOL/HDL Ratio: 2
Triglycerides: 68 mg/dL (ref 0.0–149.0)
VLDL: 13.6 mg/dL (ref 0.0–40.0)

## 2022-11-03 LAB — HEPATIC FUNCTION PANEL
ALT: 14 U/L (ref 0–35)
AST: 21 U/L (ref 0–37)
Albumin: 4.2 g/dL (ref 3.5–5.2)
Alkaline Phosphatase: 83 U/L (ref 39–117)
Bilirubin, Direct: 0.2 mg/dL (ref 0.0–0.3)
Total Bilirubin: 0.7 mg/dL (ref 0.2–1.2)
Total Protein: 6.5 g/dL (ref 6.0–8.3)

## 2022-11-06 ENCOUNTER — Encounter: Payer: Self-pay | Admitting: Internal Medicine

## 2022-11-06 ENCOUNTER — Ambulatory Visit (INDEPENDENT_AMBULATORY_CARE_PROVIDER_SITE_OTHER): Payer: PPO | Admitting: Internal Medicine

## 2022-11-06 VITALS — BP 138/70 | HR 65 | Temp 97.8°F | Resp 17 | Ht 64.0 in | Wt 103.0 lb

## 2022-11-06 DIAGNOSIS — I10 Essential (primary) hypertension: Secondary | ICD-10-CM | POA: Diagnosis not present

## 2022-11-06 DIAGNOSIS — G8320 Monoplegia of upper limb affecting unspecified side: Secondary | ICD-10-CM

## 2022-11-06 DIAGNOSIS — E039 Hypothyroidism, unspecified: Secondary | ICD-10-CM

## 2022-11-06 DIAGNOSIS — F4323 Adjustment disorder with mixed anxiety and depressed mood: Secondary | ICD-10-CM

## 2022-11-06 DIAGNOSIS — F320 Major depressive disorder, single episode, mild: Secondary | ICD-10-CM | POA: Diagnosis not present

## 2022-11-06 DIAGNOSIS — G44309 Post-traumatic headache, unspecified, not intractable: Secondary | ICD-10-CM

## 2022-11-06 DIAGNOSIS — E875 Hyperkalemia: Secondary | ICD-10-CM

## 2022-11-06 DIAGNOSIS — R7989 Other specified abnormal findings of blood chemistry: Secondary | ICD-10-CM | POA: Diagnosis not present

## 2022-11-06 DIAGNOSIS — K219 Gastro-esophageal reflux disease without esophagitis: Secondary | ICD-10-CM

## 2022-11-06 DIAGNOSIS — I639 Cerebral infarction, unspecified: Secondary | ICD-10-CM | POA: Diagnosis not present

## 2022-11-06 DIAGNOSIS — Z8673 Personal history of transient ischemic attack (TIA), and cerebral infarction without residual deficits: Secondary | ICD-10-CM | POA: Diagnosis not present

## 2022-11-06 DIAGNOSIS — E7800 Pure hypercholesterolemia, unspecified: Secondary | ICD-10-CM | POA: Diagnosis not present

## 2022-11-06 DIAGNOSIS — R002 Palpitations: Secondary | ICD-10-CM

## 2022-11-06 DIAGNOSIS — M0600 Rheumatoid arthritis without rheumatoid factor, unspecified site: Secondary | ICD-10-CM | POA: Diagnosis not present

## 2022-11-06 DIAGNOSIS — E538 Deficiency of other specified B group vitamins: Secondary | ICD-10-CM

## 2022-11-06 LAB — POTASSIUM: Potassium: 4.6 mEq/L (ref 3.5–5.1)

## 2022-11-06 NOTE — Progress Notes (Unsigned)
Patient ID: Donna Rhodes, female   DOB: 03-05-1946, 76 y.o.   MRN: [id number]   Subjective:    Patient ID: Donna Rhodes, female    DOB: [birth year], 76 y.o.   MRN: [id number]    Patient here for  Chief Complaint  Patient presents with   Follow-up   Hypertension   Hyperlipidemia   Anemia   .   HPI Here to follow up regarding her cholesterol, blood pressure and increased stress.  History of CVA.  Seeing rheumatology - f/u RA.  On MTX.  Saw cardiology 10/27/22 - recommended zio monitor.  She brought in today.  Had questions.  Was concerned regarding not being able to place the monitor on her skin.  No chest pain.  Breathing stable.  Having persistent head pain - left posterior head pain - occurs when lying on that side.  When turns over to her right side - feels a pulling - over the left.  Motion in her head.  No room spinning dizziness.  No abdominal pain.  Increased stress.  Family stress.  Discussed.  Discussed referral to psych.  She declines at this time.     Past Medical History:  Diagnosis Date   Atrophic vaginitis    Dysplasia of vagina    vin 1 - vulva and vaginal cuff   GERD (gastroesophageal reflux disease)    HCV infection    History of basal cell carcinoma (BCC) 04/11/2019   right lat lower eyelid margin Mohs 2020 Dr. Lacinda Axon   HSV (herpes simplex virus) infection    Hypercholesterolemia    Hypertension    Hypothyroidism    Left-sided weakness    S/P CVA 4/18.  PT 2x/wk.   Lung nodule    stable   Menopausal state    Osteoarthritis    hand involvement, cervical disc disease   Osteoporosis    Pelvic pain in female    Rheumatoid arthritis ([place])    Stroke (cerebrum) ([place]) 04/24/2017   Thyroid nodule    pt unaware of this    Vaginal Pap smear, abnormal    lgsil   Past Surgical History:  Procedure Laterality Date   [place]   left   CATARACT EXTRACTION W/PHACO Right 10/27/2017   Procedure: CATARACT  EXTRACTION PHACO AND INTRAOCULAR LENS PLACEMENT ([place]) RIGHT;  Surgeon: Leandrew Koyanagi, MD;  Location: [facility];  Service: Ophthalmology;  Laterality: Right;  requests early (8-8:30 arrival)   CATARACT EXTRACTION W/PHACO Left 11/10/2017   Procedure: CATARACT EXTRACTION PHACO AND INTRAOCULAR LENS PLACEMENT ([place]) LEFT;  Surgeon: Leandrew Koyanagi, MD;  Location: [facility];  Service: Ophthalmology;  Laterality: Left;   CHOLECYSTECTOMY     FINGER SURGERY  1982   laser vein surgery     LOOP RECORDER INSERTION N/A 09/24/2017   Procedure: LOOP RECORDER INSERTION;  Surgeon: Evans Lance, MD;  Location: [facility];  Service: Cardiovascular;  Laterality: N/A;   MANDIBLE RECONSTRUCTION  1992   TOENAIL EXCISION  1998   several   TONSILLECTOMY     TUBAL LIGATION  1977   VAGINAL HYSTERECTOMY  1983   als0- anterior/posterior colporrhaphy   VAGINAL WOUND CLOSURE / [place], 2000   VAGINAL WOUND CLOSURE / REPAIR     Family History  Problem Relation Age of Onset   Hypertension Father    Dementia Father    Osteoarthritis Mother    Arthritis/Rheumatoid Sister  Breast cancer Other        paternal side   Breast cancer Paternal Aunt    Breast cancer Cousin        maternal   Uterine cancer Maternal Grandmother    Ovarian cancer Neg Hx    Heart disease Neg Hx    Diabetes Neg Hx    Colon cancer Neg Hx    Social History   Socioeconomic History   Marital status: Divorced    Spouse name: Not on file   Number of children: 3   Years of education: 12   Highest education level: Not on file  Occupational History    Comment: retired  Tobacco Use   Smoking status: Former    Types: Cigarettes    Quit date: 12/29/1979    Years since quitting: 42.8   Smokeless tobacco: Never  Vaping Use   Vaping Use: Never used  Substance and Sexual Activity   Alcohol use: No    Alcohol/week: 0.0 standard drinks of alcohol   Drug use: No   Sexual activity: Not Currently     Birth control/protection: Surgical  Other Topics Concern   Not on file  Social History Narrative   Her mother and her daughter live with her   Caffeine- 1 cup coffee   Social Determinants of Health   Financial Resource Strain: Low Risk  (02/24/2021)   Overall Financial Resource Strain (CARDIA)    Difficulty of Paying Living Expenses: Not hard at all  Food Insecurity: No Food Insecurity (02/24/2021)   Hunger Vital Sign    Worried About Running Out of Food in the Last Year: Never true    [place] in the Last Year: Never true  Transportation Needs: No Transportation Needs (02/24/2021)   PRAPARE - Transportation    Lack of Transportation (Medical): No    Lack of Transportation (Non-Medical): No  Physical Activity: Not on file  Stress: No Stress Concern Present (02/24/2021)   [place]    Feeling of Stress : Only a little  Social Connections: Unknown (02/24/2021)   Social Connection and Isolation Panel [NHANES]    Frequency of Communication with Friends and Family: More than three times a week    Frequency of Social Gatherings with Friends and Family: More than three times a week    Attends Religious Services: Not on Advertising copywriter or Organizations: Not on file    Attends Archivist Meetings: Not on file    Marital Status: Not on file     Review of Systems  Constitutional:  Negative for appetite change and unexpected weight change.  HENT:  Negative for congestion and sinus pressure.   Respiratory:  Negative for cough, chest tightness and shortness of breath.   Cardiovascular:  Negative for chest pain, palpitations and leg swelling.  Gastrointestinal:  Negative for abdominal pain, diarrhea, nausea and vomiting.  Genitourinary:  Negative for difficulty urinating and dysuria.  Musculoskeletal:  Negative for myalgias.       Joint pain - RA.   Skin:  Negative for color change and rash.   Neurological:        Head sensation as outlined - motion.  Head pain as outlined.   Psychiatric/Behavioral:  Negative for agitation and dysphoric mood.        Objective:     BP 138/70 (BP Location: Left Arm, Patient Position: Sitting, Cuff Size: Small)   Pulse 65  Temp 97.8 F (36.6 C) (Temporal)   Resp 17   Ht '5\' 4"'$  (1.626 m)   Wt 103 lb (46.7 kg)   LMP 12/29/1981   SpO2 98%   BMI 17.68 kg/m  Wt Readings from Last 3 Encounters:  11/06/22 103 lb (46.7 kg)  10/27/22 104 lb 6.4 oz (47.4 kg)  07/31/22 106 lb (48.1 kg)    Physical Exam Vitals reviewed.  Constitutional:      General: She is not in acute distress.    Appearance: Normal appearance.  HENT:     Head: Normocephalic and atraumatic.     Right Ear: External ear normal.     Left Ear: External ear normal.  Eyes:     General: No scleral icterus.       Right eye: No discharge.        Left eye: No discharge.     Conjunctiva/sclera: Conjunctivae normal.  Neck:     Thyroid: No thyromegaly.  Cardiovascular:     Rate and Rhythm: Normal rate and regular rhythm.  Pulmonary:     Effort: No respiratory distress.     Breath sounds: Normal breath sounds. No wheezing.  Abdominal:     General: Bowel sounds are normal.     Palpations: Abdomen is soft.     Tenderness: There is no abdominal tenderness.  Musculoskeletal:        General: No swelling or tenderness.     Cervical back: Neck supple. No tenderness.  Lymphadenopathy:     Cervical: No cervical adenopathy.  Skin:    Findings: No erythema or rash.  Neurological:     Mental Status: She is alert.  Psychiatric:        Mood and Affect: Mood normal.        Behavior: Behavior normal.      Outpatient Encounter Medications as of 11/06/2022  Medication Sig   acetaminophen (TYLENOL) 500 MG tablet Take 1-2 tablets (500-1,000 mg total) by mouth every 6 (six) hours as needed for mild pain.   acyclovir (ZOVIRAX) 200 MG capsule TAKE 1 CAPSULE BY MOUTH TWICE DAILY    amLODipine (NORVASC) 2.5 MG tablet TAKE 1 TABLET BY MOUTH DAILY   atorvastatin (LIPITOR) 20 MG tablet TAKE 1 TABLET BY MOUTH DAILY   calcium-vitamin D (OSCAL WITH D) 250-125 MG-UNIT tablet Take 1 tablet by mouth daily.   clopidogrel (PLAVIX) 75 MG tablet TAKE 1 TABLET BY MOUTH DAILY   folic acid (FOLVITE) 1 MG tablet Take 1 mg by mouth daily.   ketoconazole (NIZORAL) 2 % shampoo Apply 1 application topically 2 (two) times a week. massage into scalp and leave in for 5 - 10 minutes before rinsing out   levothyroxine (SYNTHROID) 50 MCG tablet TAKE 1 TABLET BY MOUTH DAILY   methotrexate 2.5 MG tablet Take 10 mg by mouth once a week.   No facility-administered encounter medications on file as of 11/06/2022.     Lab Results  Component Value Date   WBC 4.9 11/03/2022   HGB 12.3 11/03/2022   HCT 37.9 11/03/2022   PLT 241.0 11/03/2022   GLUCOSE 83 11/03/2022   CHOL 151 11/03/2022   TRIG 68.0 11/03/2022   HDL 65.10 11/03/2022   LDLDIRECT 128.0 05/25/2013   LDLCALC 72 11/03/2022   ALT 14 11/03/2022   AST 21 11/03/2022   NA 142 11/03/2022   K 4.6 11/06/2022   CL 108 11/03/2022   CREATININE 0.70 11/03/2022   BUN 13 11/03/2022   CO2 28 11/03/2022  TSH 1.98 07/29/2022   HGBA1C 5.7 (H) 04/25/2017    CT HEAD WO CONTRAST (5MM)  Result Date: 06/11/2022 CLINICAL DATA:  Head trauma, moderate-severe EXAM: CT HEAD WITHOUT CONTRAST TECHNIQUE: Contiguous axial images were obtained from the base of the skull through the vertex without intravenous contrast. RADIATION DOSE REDUCTION: This exam was performed according to the departmental dose-optimization program which includes automated exposure control, adjustment of the mA and/or kV according to patient size and/or use of iterative reconstruction technique. COMPARISON:  MRI head April 25, 2021. FINDINGS: Brain: No evidence of acute large vascular territory infarction, hemorrhage, hydrocephalus, extra-axial collection or mass lesion/mass effect. Remote  perforator infarct in the right basal ganglia. Vascular: No hyperdense vessel identified. Skull: No acute fracture. Sinuses/Orbits: Clear visualized sinuses. No acute orbital findings. Other: No mastoid effusions. IMPRESSION: 1. No evidence of acute intracranial abnormality. 2. Remote right basal ganglia perforator infarct. Electronically Signed   By: Margaretha Sheffield M.D.   On: 06/11/2022 16:27       Assessment & Plan:   Problem List Items Addressed This Visit     Abnormal liver function test    Previous ultrasound revealed fatty liver.  Recent liver panel wnl.        Adjustment disorder with mixed anxiety and depressed mood    Increased stress.  Discussed.  Discussed referral to therapist or psychiatry.  She declines at this time.  Declines any further intervention.  Follow.       B12 deficiency    Continue B12 injections.       Essential hypertension    Continue amlodipine.  Blood pressure as outlined.  Follow pressures.  Follow metabolic panel.       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  Continue exercise.  Follow.       Relevant Orders   Ambulatory referral to Neurology   GERD (gastroesophageal reflux disease)    Continue protonix.  No acid reflux reported.  Follow.       Head pain    Head pain as outlined.  Pain (localized left posterior head) when applying pressure on that side - lying down on that side.  Pulling sensation in same area with lying on right side.  Previous CT negative.  Discussed further evaluation.  Discussed f/u with neurology.       Relevant Orders   Ambulatory referral to Neurology   History of CVA (cerebrovascular accident)    Continue plavix. Has been on lipitor.   Follow lipid panel.       Relevant Orders   Ambulatory referral to Neurology   Hypercholesterolemia    On lipitor.  Follow lipid panel and liver function tests.        Relevant Orders   Hepatic  function panel   Lipid panel   Hypothyroidism, unspecified    On thyroid replacement.  Follow tsh.       Major depressive disorder, single episode, mild (HCC)    Increased stress as outlined.  Discussed.  Declines any further intervention at this time.  Follow.       Palpitations    Saw cardiology.  Recommended zio monitor.  She brought wit her today.  Had placed here.       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)    On MTX.  Seeing rheumatology (Dr DeFoor).  Stable.       Other Visit Diagnoses  Hyperkalemia    -  Primary   Relevant Orders   Potassium (Completed)        Einar Pheasant, MD

## 2022-11-07 ENCOUNTER — Encounter: Payer: Self-pay | Admitting: Internal Medicine

## 2022-11-07 NOTE — Assessment & Plan Note (Signed)
Continue amlodipine.  Blood pressure as outlined.  Follow pressures.  Follow metabolic panel.  

## 2022-11-07 NOTE — Assessment & Plan Note (Signed)
Head pain as outlined.  Pain (localized left posterior head) when applying pressure on that side - lying down on that side.  Pulling sensation in same area with lying on right side.  Previous CT negative.  Discussed further evaluation.  Discussed f/u with neurology.

## 2022-11-07 NOTE — Assessment & Plan Note (Signed)
Still some limitations.  Continue exercise.  Follow.  

## 2022-11-07 NOTE — Assessment & Plan Note (Signed)
Saw cardiology.  Recommended zio monitor.  She brought wit her today.  Had placed here.

## 2022-11-07 NOTE — Assessment & Plan Note (Signed)
Continue B12 injections.   

## 2022-11-07 NOTE — Assessment & Plan Note (Signed)
Increased stress as outlined.  Discussed.  Declines any further intervention at this time.  Follow.  

## 2022-11-07 NOTE — Assessment & Plan Note (Signed)
On MTX.  Seeing rheumatology (Dr DeFoor).  Stable.  

## 2022-11-07 NOTE — Assessment & Plan Note (Signed)
On thyroid replacement.  Follow tsh.  

## 2022-11-07 NOTE — Assessment & Plan Note (Signed)
Increased stress.  Discussed.  Discussed referral to therapist or psychiatry.  She declines at this time.  Declines any further intervention.  Follow.  

## 2022-11-07 NOTE — Assessment & Plan Note (Signed)
On lipitor.  Follow lipid panel and liver function tests.   

## 2022-11-07 NOTE — Assessment & Plan Note (Signed)
Previous ultrasound revealed fatty liver.  Recent liver panel wnl.   

## 2022-11-07 NOTE — Assessment & Plan Note (Signed)
Continue protonix.  No acid reflux reported.  Follow.  

## 2022-11-07 NOTE — Assessment & Plan Note (Signed)
Continue plavix. Has been on lipitor.   Follow lipid panel.  

## 2022-11-12 ENCOUNTER — Telehealth: Payer: Self-pay

## 2022-11-12 NOTE — Telephone Encounter (Signed)
Patient dropped off form to renew disability parking placard.  Patient states she would like for [place] to please mail it to her at [address], [place], [place] [address].  Form is in Dr. Randell Patient Scott's color folder up front.

## 2022-11-17 NOTE — Telephone Encounter (Signed)
Signed and placed in box.   

## 2022-11-17 NOTE — Telephone Encounter (Signed)
Placed in quick sign. 

## 2022-11-25 DIAGNOSIS — R002 Palpitations: Secondary | ICD-10-CM | POA: Diagnosis not present

## 2022-11-25 DIAGNOSIS — I639 Cerebral infarction, unspecified: Secondary | ICD-10-CM | POA: Diagnosis not present

## 2022-12-07 ENCOUNTER — Telehealth: Payer: Self-pay | Admitting: Internal Medicine

## 2022-12-07 NOTE — Telephone Encounter (Signed)
Patient calling in regards to her heart monitor and the results. Please advise

## 2022-12-08 ENCOUNTER — Telehealth: Payer: Self-pay

## 2022-12-08 NOTE — Telephone Encounter (Signed)
Will enter Recall when orders received.

## 2022-12-08 NOTE — Telephone Encounter (Signed)
-----   Message from Evans Lance, MD sent at 11/26/2022  9:21 PM EST ----- Reassuring heart monitor. No change in meds.

## 2022-12-08 NOTE — Telephone Encounter (Signed)
Pt called and shared Heart monitor results with patient.  Pt had question regarding when to follow up?  Will consult Dr. Lovena Le.

## 2022-12-11 NOTE — Telephone Encounter (Signed)
F/u in 3 months

## 2022-12-14 NOTE — Telephone Encounter (Signed)
Pt called and left voicemail message to address follow up.   Message left made Pt aware of 3 month follow up recall to schedule appointment.  No follow up required at this time.

## 2022-12-17 ENCOUNTER — Telehealth: Payer: Self-pay

## 2022-12-17 NOTE — Telephone Encounter (Signed)
Opened in error, pt aware of monitor results, per prior note.

## 2022-12-17 NOTE — Telephone Encounter (Signed)
-----   Message from Evans Lance, MD sent at 11/26/2022  9:21 PM EST ----- Reassuring heart monitor. No change in meds.

## 2022-12-23 ENCOUNTER — Telehealth: Payer: Self-pay | Admitting: Internal Medicine

## 2022-12-23 NOTE — Telephone Encounter (Signed)
Pt called stating that she has a possible UTI. And would like to know if the doctor can put an order in for her drop urine off. She's aware that the provider its not in the office, and that needs an appt for that.

## 2022-12-23 NOTE — Telephone Encounter (Signed)
Attempted to call pt to advise : no open appointments in office until 1/2                                                   No virtual w/ Colin Benton until 1/2  If pt having UTI sx needs to go to urgent care to be evaluated

## 2022-12-24 NOTE — Telephone Encounter (Signed)
Attempted to call pt to advise X2 - lm TO CB :  no open appointments in office until 1/2                                                    No virtual w/ Colin Benton until 1/2   If pt having UTI sx needs to go to urgent care to be evaluated

## 2022-12-25 NOTE — Telephone Encounter (Signed)
Attempted to call pt to advise X3 - lm TO CB :  no open appointments in office until 1/2                                                    No virtual w/ Colin Benton until 1/2   If pt having UTI sx needs to go to urgent care to be evaluated

## 2022-12-30 DIAGNOSIS — M25511 Pain in right shoulder: Secondary | ICD-10-CM | POA: Diagnosis not present

## 2023-01-01 ENCOUNTER — Ambulatory Visit: Payer: PPO | Admitting: Nurse Practitioner

## 2023-01-01 DIAGNOSIS — R350 Frequency of micturition: Secondary | ICD-10-CM

## 2023-01-01 DIAGNOSIS — R300 Dysuria: Secondary | ICD-10-CM

## 2023-01-04 ENCOUNTER — Other Ambulatory Visit: Payer: Self-pay | Admitting: Internal Medicine

## 2023-01-27 ENCOUNTER — Ambulatory Visit: Payer: PPO | Admitting: Dermatology

## 2023-01-27 VITALS — BP 117/68 | HR 82

## 2023-01-27 DIAGNOSIS — L578 Other skin changes due to chronic exposure to nonionizing radiation: Secondary | ICD-10-CM | POA: Diagnosis not present

## 2023-01-27 DIAGNOSIS — L820 Inflamed seborrheic keratosis: Secondary | ICD-10-CM

## 2023-01-27 DIAGNOSIS — L570 Actinic keratosis: Secondary | ICD-10-CM | POA: Diagnosis not present

## 2023-01-27 DIAGNOSIS — L814 Other melanin hyperpigmentation: Secondary | ICD-10-CM | POA: Diagnosis not present

## 2023-01-27 DIAGNOSIS — L821 Other seborrheic keratosis: Secondary | ICD-10-CM

## 2023-01-27 NOTE — Progress Notes (Signed)
   Follow-Up Visit   Subjective  Donna Rhodes is a 77 y.o. female who presents for the following: Irregular skin lesions (On the L upper lip and arms - patient is concerned and would like lesions checked today ). The patient has spots, moles and lesions to be evaluated, some may be new or changing and the patient has concerns that these could be cancer.  The following portions of the chart were reviewed this encounter and updated as appropriate:   Tobacco  Allergies  Meds  Problems  Med Hx  Surg Hx  Fam Hx     Review of Systems:  No other skin or systemic complaints except as noted in HPI or Assessment and Plan.  Objective  Well appearing patient in no apparent distress; mood and affect are within normal limits.  A focused examination was performed including the face and arms. Relevant physical exam findings are noted in the Assessment and Plan.  L upper lip x 1 Erythematous thin papules/macules with gritty scale.   R arm x 9 (9) Erythematous stuck-on, waxy papule or plaque   Assessment & Plan  AK (actinic keratosis) L upper lip x 1 Destruction of lesion - L upper lip x 1 Complexity: simple   Destruction method: cryotherapy   Informed consent: discussed and consent obtained   Timeout:  patient name, date of birth, surgical site, and procedure verified Lesion destroyed using liquid nitrogen: Yes   Region frozen until ice ball extended beyond lesion: Yes   Outcome: patient tolerated procedure well with no complications   Post-procedure details: wound care instructions given    Inflamed seborrheic keratosis (9) R arm x 9 Symptomatic, irritating, patient would like treated. Destruction of lesion - R arm x 9 Complexity: simple   Destruction method: cryotherapy   Informed consent: discussed and consent obtained   Timeout:  patient name, date of birth, surgical site, and procedure verified Lesion destroyed using liquid nitrogen: Yes   Region frozen until ice ball  extended beyond lesion: Yes   Outcome: patient tolerated procedure well with no complications   Post-procedure details: wound care instructions given    Seborrheic Keratoses - Stuck-on, waxy, tan-brown papules and/or plaques  - Benign-appearing - Discussed benign etiology and prognosis. - Observe - Call for any changes  Lentigines - Scattered tan macules - Due to sun exposure - Benign-appearing, observe - Recommend daily broad spectrum sunscreen SPF 30+ to sun-exposed areas, reapply every 2 hours as needed. - Call for any changes  Actinic Damage - chronic, secondary to cumulative UV radiation exposure/sun exposure over time - diffuse scaly erythematous macules with underlying dyspigmentation - Recommend daily broad spectrum sunscreen SPF 30+ to sun-exposed areas, reapply every 2 hours as needed.  - Recommend staying in the shade or wearing long sleeves, sun glasses (UVA+UVB protection) and wide brim hats (4-inch brim around the entire circumference of the hat). - Call for new or changing lesions.  Return for appointment as scheduled.  Luther Redo, CMA, am acting as scribe for Sarina Ser, MD . Documentation: I have reviewed the above documentation for accuracy and completeness, and I agree with the above.  Sarina Ser, MD

## 2023-01-27 NOTE — Patient Instructions (Signed)
Due to recent changes in healthcare laws, you may see results of your pathology and/or laboratory studies on MyChart before the doctors have had a chance to review them. We understand that in some cases there may be results that are confusing or concerning to you. Please understand that not all results are received at the same time and often the doctors may need to interpret multiple results in order to provide you with the best plan of care or course of treatment. Therefore, we ask that you please give us 2 business days to thoroughly review all your results before contacting the office for clarification. Should we see a critical lab result, you will be contacted sooner.   If You Need Anything After Your Visit  If you have any questions or concerns for your doctor, please call our main line at [phone] and press option 4 to reach your doctor's medical assistant. If no one answers, please leave a voicemail as directed and we will return your call as soon as possible. Messages left after 4 pm will be answered the following business day.   You may also send us a message via MyChart. We typically respond to MyChart messages within 1-2 business days.  For prescription refills, please ask your pharmacy to contact our office. Our fax number is [phone].  If you have an urgent issue when the clinic is closed that cannot wait until the next business day, you can page your doctor at the number below.    Please note that while we do our best to be available for urgent issues outside of office hours, we are not available 24/7.   If you have an urgent issue and are unable to reach us, you may choose to seek medical care at your doctor's office, retail clinic, urgent care center, or emergency room.  If you have a medical emergency, please immediately call 911 or go to the emergency department.  Pager Numbers  - Dr. Kowalski: [phone]  - Dr. Moye: [phone]  - Dr. Stewart:  [phone]  In the event of inclement weather, please call our main line at [phone] for an update on the status of any delays or closures.  Dermatology Medication Tips: Please keep the boxes that topical medications come in in order to help keep track of the instructions about where and how to use these. Pharmacies typically print the medication instructions only on the boxes and not directly on the medication tubes.   If your medication is too expensive, please contact our office at [phone] option 4 or send us a message through MyChart.   We are unable to tell what your co-pay for medications will be in advance as this is different depending on your insurance coverage. However, we may be able to find a substitute medication at lower cost or fill out paperwork to get insurance to cover a needed medication.   If a prior authorization is required to get your medication covered by your insurance company, please allow us 1-2 business days to complete this process.  Drug prices often vary depending on where the prescription is filled and some pharmacies may offer cheaper prices.  The website www.goodrx.com contains coupons for medications through different pharmacies. The prices here do not account for what the cost may be with help from insurance (it may be cheaper with your insurance), but the website can give you the price if you did not use any insurance.  - You can print the associated coupon and take it with   your prescription to the pharmacy.  - You may also stop by our office during regular business hours and pick up a GoodRx coupon card.  - If you need your prescription sent electronically to a different pharmacy, notify our office through [facility] MyChart or by phone at [phone] option 4.     Si Usted Necesita Algo Despus de Su Visita  Tambin puede enviarnos un mensaje a travs de MyChart. Por lo general respondemos a los mensajes de MyChart en el transcurso de 1 a 2  das hbiles.  Para renovar recetas, por favor pida a su farmacia que se ponga en contacto con nuestra oficina. Nuestro nmero de fax es el [phone].  Si tiene un asunto urgente cuando la clnica est cerrada y que no puede esperar hasta el siguiente da hbil, puede llamar/localizar a su doctor(a) al nmero que aparece a continuacin.   Por favor, tenga en cuenta que aunque hacemos todo lo posible para estar disponibles para asuntos urgentes fuera del horario de oficina, no estamos disponibles las 24 horas del da, los 7 das de la semana.   Si tiene un problema urgente y no puede comunicarse con nosotros, puede optar por buscar atencin mdica  en el consultorio de su doctor(a), en una clnica privada, en un centro de atencin urgente o en una sala de emergencias.  Si tiene una emergencia mdica, por favor llame inmediatamente al 911 o vaya a la sala de emergencias.  Nmeros de bper  - Dr. Kowalski: [phone]  - Dra. Moye: [phone]  - Dra. Stewart: [phone]  En caso de inclemencias del tiempo, por favor llame a nuestra lnea principal al [phone] para una actualizacin sobre el estado de cualquier retraso o cierre.  Consejos para la medicacin en dermatologa: Por favor, guarde las cajas en las que vienen los medicamentos de uso tpico para ayudarle a seguir las instrucciones sobre dnde y cmo usarlos. Las farmacias generalmente imprimen las instrucciones del medicamento slo en las cajas y no directamente en los tubos del medicamento.   Si su medicamento es muy caro, por favor, pngase en contacto con nuestra oficina llamando al [phone] y presione la opcin 4 o envenos un mensaje a travs de MyChart.   No podemos decirle cul ser su copago por los medicamentos por adelantado ya que esto es diferente dependiendo de la cobertura de su seguro. Sin embargo, es posible que podamos encontrar un medicamento sustituto a menor costo o llenar un formulario para que el  seguro cubra el medicamento que se considera necesario.   Si se requiere una autorizacin previa para que su compaa de seguros cubra su medicamento, por favor permtanos de 1 a 2 das hbiles para completar este proceso.  Los precios de los medicamentos varan con frecuencia dependiendo del lugar de dnde se surte la receta y alguna farmacias pueden ofrecer precios ms baratos.  El sitio web www.goodrx.com tiene cupones para medicamentos de diferentes farmacias. Los precios aqu no tienen en cuenta lo que podra costar con la ayuda del seguro (puede ser ms barato con su seguro), pero el sitio web puede darle el precio si no utiliz ningn seguro.  - Puede imprimir el cupn correspondiente y llevarlo con su receta a la farmacia.  - Tambin puede pasar por nuestra oficina durante el horario de atencin regular y recoger una tarjeta de cupones de GoodRx.  - Si necesita que su receta se enve electrnicamente a una farmacia diferente, informe a nuestra oficina a travs de MyChart de [place]   o por telfono llamando al [phone] y presione la opcin 4.  

## 2023-01-30 ENCOUNTER — Encounter: Payer: Self-pay | Admitting: Dermatology

## 2023-02-02 ENCOUNTER — Ambulatory Visit
Admission: RE | Admit: 2023-02-02 | Discharge: 2023-02-02 | Disposition: A | Discharge status: Home / Self Care | Payer: PPO | Source: Ambulatory Visit | Attending: Internal Medicine | Admitting: Internal Medicine

## 2023-02-02 DIAGNOSIS — Z1231 Encounter for screening mammogram for malignant neoplasm of breast: Secondary | ICD-10-CM

## 2023-02-09 ENCOUNTER — Other Ambulatory Visit (INDEPENDENT_AMBULATORY_CARE_PROVIDER_SITE_OTHER): Payer: PPO

## 2023-02-09 DIAGNOSIS — E7800 Pure hypercholesterolemia, unspecified: Secondary | ICD-10-CM

## 2023-02-09 DIAGNOSIS — I10 Essential (primary) hypertension: Secondary | ICD-10-CM

## 2023-02-09 LAB — HEPATIC FUNCTION PANEL
ALT: 16 U/L (ref 0–35)
AST: 19 U/L (ref 0–37)
Albumin: 4.1 g/dL (ref 3.5–5.2)
Alkaline Phosphatase: 97 U/L (ref 39–117)
Bilirubin, Direct: 0.2 mg/dL (ref 0.0–0.3)
Total Bilirubin: 0.7 mg/dL (ref 0.2–1.2)
Total Protein: 6.4 g/dL (ref 6.0–8.3)

## 2023-02-09 LAB — BASIC METABOLIC PANEL
BUN: 11 mg/dL (ref 6–23)
CO2: 28 mEq/L (ref 19–32)
Calcium: 9.5 mg/dL (ref 8.4–10.5)
Chloride: 106 mEq/L (ref 96–112)
Creatinine, Ser: 0.72 mg/dL (ref 0.40–1.20)
GFR: 80.75 mL/min (ref >60.00)
Glucose, Bld: 78 mg/dL (ref 70–99)
Potassium: 4.5 mEq/L (ref 3.5–5.1)
Sodium: 142 mEq/L (ref 135–145)

## 2023-02-09 LAB — LIPID PANEL
Cholesterol: 160 mg/dL (ref 0–200)
HDL: 60.6 mg/dL (ref >39.00)
LDL Cholesterol: 82 mg/dL (ref 0–99)
NonHDL: 98.98
Total CHOL/HDL Ratio: 3
Triglycerides: 83 mg/dL (ref 0.0–149.0)
VLDL: 16.6 mg/dL (ref 0.0–40.0)

## 2023-02-10 ENCOUNTER — Encounter: Payer: Self-pay | Admitting: Internal Medicine

## 2023-02-10 ENCOUNTER — Ambulatory Visit (INDEPENDENT_AMBULATORY_CARE_PROVIDER_SITE_OTHER): Payer: PPO | Admitting: Internal Medicine

## 2023-02-10 VITALS — BP 124/70 | HR 70 | Temp 98.2°F | Resp 16 | Ht 64.0 in | Wt 97.8 lb

## 2023-02-10 DIAGNOSIS — R7989 Other specified abnormal findings of blood chemistry: Secondary | ICD-10-CM

## 2023-02-10 DIAGNOSIS — F4323 Adjustment disorder with mixed anxiety and depressed mood: Secondary | ICD-10-CM

## 2023-02-10 DIAGNOSIS — L659 Nonscarring hair loss, unspecified: Secondary | ICD-10-CM | POA: Diagnosis not present

## 2023-02-10 DIAGNOSIS — K219 Gastro-esophageal reflux disease without esophagitis: Secondary | ICD-10-CM

## 2023-02-10 DIAGNOSIS — E7800 Pure hypercholesterolemia, unspecified: Secondary | ICD-10-CM

## 2023-02-10 DIAGNOSIS — R350 Frequency of micturition: Secondary | ICD-10-CM | POA: Diagnosis not present

## 2023-02-10 DIAGNOSIS — M0600 Rheumatoid arthritis without rheumatoid factor, unspecified site: Secondary | ICD-10-CM

## 2023-02-10 DIAGNOSIS — Z8673 Personal history of transient ischemic attack (TIA), and cerebral infarction without residual deficits: Secondary | ICD-10-CM

## 2023-02-10 DIAGNOSIS — E039 Hypothyroidism, unspecified: Secondary | ICD-10-CM | POA: Diagnosis not present

## 2023-02-10 DIAGNOSIS — F320 Major depressive disorder, single episode, mild: Secondary | ICD-10-CM

## 2023-02-10 DIAGNOSIS — G44309 Post-traumatic headache, unspecified, not intractable: Secondary | ICD-10-CM | POA: Diagnosis not present

## 2023-02-10 DIAGNOSIS — I10 Essential (primary) hypertension: Secondary | ICD-10-CM | POA: Diagnosis not present

## 2023-02-10 DIAGNOSIS — G8320 Monoplegia of upper limb affecting unspecified side: Secondary | ICD-10-CM | POA: Diagnosis not present

## 2023-02-10 DIAGNOSIS — R87622 Low grade squamous intraepithelial lesion on cytologic smear of vagina (LGSIL): Secondary | ICD-10-CM

## 2023-02-10 LAB — URINALYSIS, ROUTINE W REFLEX MICROSCOPIC
Hgb urine dipstick: NEGATIVE
Nitrite: NEGATIVE
Specific Gravity, Urine: >=1.03 — AB (ref 1.000–1.030)
Total Protein, Urine: NEGATIVE
Urine Glucose: NEGATIVE
Urobilinogen, UA: 0.2 (ref 0.0–1.0)
pH: 6 (ref 5.0–8.0)

## 2023-02-10 LAB — TSH: TSH: 2.05 u[IU]/mL (ref 0.35–5.50)

## 2023-02-10 MED ORDER — LEVOTHYROXINE SODIUM 50 MCG PO TABS: 50.0000 ug | ORAL_TABLET | Freq: Every day | ORAL | 1 refills | Status: DC

## 2023-02-10 MED ORDER — ACYCLOVIR 200 MG PO CAPS: 200.0000 mg | ORAL_CAPSULE | Freq: Two times a day (BID) | ORAL | 3 refills | Status: DC

## 2023-02-10 MED ORDER — CLOPIDOGREL BISULFATE 75 MG PO TABS: 75.0000 mg | ORAL_TABLET | Freq: Every day | ORAL | 1 refills | Status: DC

## 2023-02-10 MED ORDER — AMLODIPINE BESYLATE 2.5 MG PO TABS: 2.5000 mg | ORAL_TABLET | Freq: Every day | ORAL | 1 refills | Status: DC

## 2023-02-10 NOTE — Progress Notes (Signed)
Subjective:    Patient ID: Donna Rhodes, female    DOB: 1946-02-11, 77 y.o.   MRN: [id number]  Patient here for  Chief Complaint  Patient presents with   Medical Management of Chronic Issues    HPI Here to follow up regarding her cholesterol, blood pressure and increased stress. History of CVA. Seeing rheumatology - f/u RA. On MTX. Saw cardiology 10/27/22 - recommended zio monitor. Per note - heart monitor - reassuring. Still with increased stress.  Daughter is in rehab.  Wanting to come home.  Discussed. No chest pain or sob reported.  No increased cough or congestion.  Does report some increased urinary frequency.  Discussed checking urine for possible infection.  Some hair loss.  Wants thyroid checked.  Bowels stable.     Past Medical History:  Diagnosis Date   Atrophic vaginitis    Dysplasia of vagina    vin 1 - vulva and vaginal cuff   GERD (gastroesophageal reflux disease)    HCV infection    History of basal cell carcinoma (BCC) 04/11/2019   right lat lower eyelid margin Mohs 2020 Dr. Lacinda Axon   HSV (herpes simplex virus) infection    Hypercholesterolemia    Hypertension    Hypothyroidism    Left-sided weakness    S/P CVA 4/18.  PT 2x/wk.   Lung nodule    stable   Menopausal state    Osteoarthritis    hand involvement, cervical disc disease   Osteoporosis    Pelvic pain in female    Rheumatoid arthritis ([place])    Stroke (cerebrum) ([place]) 04/24/2017   Thyroid nodule    pt unaware of this    Vaginal Pap smear, abnormal    lgsil   Past Surgical History:  Procedure Laterality Date   [place]   left   CATARACT EXTRACTION W/PHACO Right 10/27/2017   Procedure: CATARACT EXTRACTION PHACO AND INTRAOCULAR LENS PLACEMENT ([place]) RIGHT;  Surgeon: Leandrew Koyanagi, MD;  Location: [facility];  Service: Ophthalmology;  Laterality: Right;  requests early (8-8:30 arrival)   CATARACT EXTRACTION W/PHACO Left 11/10/2017    Procedure: CATARACT EXTRACTION PHACO AND INTRAOCULAR LENS PLACEMENT ([place]) LEFT;  Surgeon: Leandrew Koyanagi, MD;  Location: [facility];  Service: Ophthalmology;  Laterality: Left;   CHOLECYSTECTOMY     FINGER SURGERY  1982   laser vein surgery     LOOP RECORDER INSERTION N/A 09/24/2017   Procedure: LOOP RECORDER INSERTION;  Surgeon: Evans Lance, MD;  Location: [facility];  Service: Cardiovascular;  Laterality: N/A;   MANDIBLE RECONSTRUCTION  1992   TOENAIL EXCISION  1998   several   TONSILLECTOMY     TUBAL LIGATION  1977   VAGINAL HYSTERECTOMY  1983   als0- anterior/posterior colporrhaphy   VAGINAL WOUND CLOSURE / REPAIR  1999, 2000   VAGINAL WOUND CLOSURE / REPAIR     Family History  Problem Relation Age of Onset   Hypertension Father    Dementia Father    Osteoarthritis Mother    Arthritis/Rheumatoid Sister    Breast cancer Other        paternal side   Breast cancer Paternal Aunt    Breast cancer Cousin        maternal   Uterine cancer Maternal Grandmother    Ovarian cancer Neg Hx    Heart disease Neg Hx    Diabetes Neg Hx    Colon cancer  Neg Hx    Social History   Socioeconomic History   Marital status: Divorced    Spouse name: Not on file   Number of children: 3   Years of education: 12   Highest education level: Not on file  Occupational History    Comment: retired  Tobacco Use   Smoking status: Former    Types: Cigarettes    Quit date: 12/29/1979    Years since quitting: 43.1   Smokeless tobacco: Never  Vaping Use   Vaping Use: Never used  Substance and Sexual Activity   Alcohol use: No    Alcohol/week: 0.0 standard drinks of alcohol   Drug use: No   Sexual activity: Not Currently    Birth control/protection: Surgical  Other Topics Concern   Not on file  Social History Narrative   Her mother and her daughter live with her   Caffeine- 1 cup coffee   Social Determinants of Health   Financial Resource Strain: Low Risk   (02/24/2021)   Overall Financial Resource Strain (CARDIA)    Difficulty of Paying Living Expenses: Not hard at all  Food Insecurity: No Food Insecurity (02/24/2021)   Hunger Vital Sign    Worried About Running Out of Food in the Last Year: Never true    [place] in the Last Year: Never true  Transportation Needs: No Transportation Needs (02/24/2021)   PRAPARE - Transportation    Lack of Transportation (Medical): No    Lack of Transportation (Non-Medical): No  Physical Activity: Not on file  Stress: No Stress Concern Present (02/24/2021)   [place]    Feeling of Stress : Only a little  Social Connections: Unknown (02/24/2021)   Social Connection and Isolation Panel [NHANES]    Frequency of Communication with Friends and Family: More than three times a week    Frequency of Social Gatherings with Friends and Family: More than three times a week    Attends Religious Services: Not on Advertising copywriter or Organizations: Not on file    Attends Archivist Meetings: Not on file    Marital Status: Not on file     Review of Systems  Constitutional:  Negative for appetite change and unexpected weight change.  HENT:  Negative for congestion and sinus pressure.   Respiratory:  Negative for cough, chest tightness and shortness of breath.   Cardiovascular:  Negative for chest pain, palpitations and leg swelling.  Gastrointestinal:  Negative for abdominal pain, diarrhea, nausea and vomiting.  Genitourinary:  Positive for frequency. Negative for difficulty urinating and dysuria.  Musculoskeletal:  Negative for joint swelling and myalgias.  Skin:  Negative for color change and rash.  Neurological:  Negative for dizziness and headaches.  Psychiatric/Behavioral:  Negative for agitation.        Increased stress.        Objective:     BP 124/70   Pulse 70   Temp 98.2 F (36.8 C)   Resp 16   Ht 5' 4"$   (1.626 m)   Wt 97 lb 12.8 oz (44.4 kg)   LMP 12/29/1981   SpO2 98%   BMI 16.79 kg/m  Wt Readings from Last 3 Encounters:  02/10/23 97 lb 12.8 oz (44.4 kg)  11/06/22 103 lb (46.7 kg)  10/27/22 104 lb 6.4 oz (47.4 kg)    Physical Exam Vitals reviewed.  Constitutional:      General: She  is not in acute distress.    Appearance: Normal appearance.  HENT:     Head: Normocephalic and atraumatic.     Right Ear: External ear normal.     Left Ear: External ear normal.  Eyes:     General: No scleral icterus.       Right eye: No discharge.        Left eye: No discharge.     Conjunctiva/sclera: Conjunctivae normal.  Neck:     Thyroid: No thyromegaly.  Cardiovascular:     Rate and Rhythm: Normal rate and regular rhythm.  Pulmonary:     Effort: No respiratory distress.     Breath sounds: Normal breath sounds. No wheezing.  Abdominal:     General: Bowel sounds are normal.     Palpations: Abdomen is soft.     Tenderness: There is no abdominal tenderness.  Musculoskeletal:        General: No swelling or tenderness.     Cervical back: Neck supple. No tenderness.  Lymphadenopathy:     Cervical: No cervical adenopathy.  Skin:    Findings: No erythema or rash.  Neurological:     Mental Status: She is alert.  Psychiatric:        Mood and Affect: Mood normal.        Behavior: Behavior normal.      Outpatient Encounter Medications as of 02/10/2023  Medication Sig   acetaminophen (TYLENOL) 500 MG tablet Take 1-2 tablets (500-1,000 mg total) by mouth every 6 (six) hours as needed for mild pain.   acyclovir (ZOVIRAX) 200 MG capsule Take 1 capsule (200 mg total) by mouth 2 (two) times daily.   amLODipine (NORVASC) 2.5 MG tablet Take 1 tablet (2.5 mg total) by mouth daily.   atorvastatin (LIPITOR) 20 MG tablet TAKE 1 TABLET BY MOUTH DAILY   calcium-vitamin D (OSCAL WITH D) 250-125 MG-UNIT tablet Take 1 tablet by mouth daily.   clopidogrel (PLAVIX) 75 MG tablet Take 1 tablet (75 mg total)  by mouth daily.   folic acid (FOLVITE) 1 MG tablet Take 1 mg by mouth daily.   ketoconazole (NIZORAL) 2 % shampoo Apply 1 application topically 2 (two) times a week. massage into scalp and leave in for 5 - 10 minutes before rinsing out   levothyroxine (SYNTHROID) 50 MCG tablet Take 1 tablet (50 mcg total) by mouth daily before breakfast.   methotrexate 2.5 MG tablet Take 10 mg by mouth once a week.   [DISCONTINUED] acyclovir (ZOVIRAX) 200 MG capsule TAKE 1 CAPSULE BY MOUTH TWICE DAILY   [DISCONTINUED] amLODipine (NORVASC) 2.5 MG tablet TAKE 1 TABLET BY MOUTH DAILY   [DISCONTINUED] clopidogrel (PLAVIX) 75 MG tablet TAKE 1 TABLET BY MOUTH DAILY   [DISCONTINUED] levothyroxine (SYNTHROID) 50 MCG tablet TAKE 1 TABLET EVERY DAY ON EMPTY STOMACHWITH A GLASS OF WATER AT LEAST 30-60 MINBEFORE BREAKFAST   No facility-administered encounter medications on file as of 02/10/2023.     Lab Results  Component Value Date   WBC 4.9 11/03/2022   HGB 12.3 11/03/2022   HCT 37.9 11/03/2022   PLT 241.0 11/03/2022   GLUCOSE 78 02/09/2023   CHOL 160 02/09/2023   TRIG 83.0 02/09/2023   HDL 60.60 02/09/2023   LDLDIRECT 128.0 05/25/2013   LDLCALC 82 02/09/2023   ALT 16 02/09/2023   AST 19 02/09/2023   NA 142 02/09/2023   K 4.5 02/09/2023   CL 106 02/09/2023   CREATININE 0.72 02/09/2023   BUN 11 02/09/2023  CO2 28 02/09/2023   TSH 2.05 02/10/2023   HGBA1C 5.7 (H) 04/25/2017    MM 3D SCREEN BREAST BILATERAL  Result Date: 02/03/2023 CLINICAL DATA:  Screening. EXAM: DIGITAL SCREENING BILATERAL MAMMOGRAM WITH TOMOSYNTHESIS AND CAD TECHNIQUE: Bilateral screening digital craniocaudal and mediolateral oblique mammograms were obtained. Bilateral screening digital breast tomosynthesis was performed. The images were evaluated with computer-aided detection. COMPARISON:  Previous exam(s). ACR Breast Density Category c: The breast tissue is heterogeneously dense, which may obscure small masses. FINDINGS: There are no  findings suspicious for malignancy. IMPRESSION: No mammographic evidence of malignancy. A result letter of this screening mammogram will be mailed directly to the patient. RECOMMENDATION: Screening mammogram in one year. (Code:SM-B-01Y) BI-RADS CATEGORY  1: Negative. Electronically Signed   By: Franki Cabot M.D.   On: 02/03/2023 15:38       Assessment & Plan:  Hypercholesterolemia Assessment & Plan: On lipitor.  Follow lipid panel and liver function tests.    Orders: -     Basic metabolic panel; Future -     Lipid panel; Future -     Hepatic function panel; Future  Hypothyroidism, unspecified type Assessment & Plan: On thyroid replacement.  Follow tsh.   Orders: -     TSH  Increased frequency of urination Assessment & Plan: Check urine to confirm no infection.   Orders: -     Urinalysis, Routine w reflex microscopic -     Urine Culture  Abnormal liver function test Assessment & Plan: Previous ultrasound revealed fatty liver.  Recent liver panel wnl.     Adjustment disorder with mixed anxiety and depressed mood Assessment & Plan: Increased stress.  Discussed.  Discussed referral to therapist or psychiatry.  She declines at this time.  Declines any further intervention.  Follow.    Benign essential HTN Assessment & Plan: Blood pressures have been doing well.  Continue current medication regimen.  Follow pressures.  Follow metabolic panel.    Essential hypertension Assessment & Plan: Continue amlodipine.  Blood pressure as outlined.  Follow pressures.  Follow metabolic panel.    Flaccid monoplegia of upper extremity (HCC) Assessment & Plan: Still some limitations.  Continue exercise.  Follow.    Gastroesophageal reflux disease, unspecified whether esophagitis present Assessment & Plan: Continue protonix.  No acid reflux reported.  Follow.    Hair thinning Assessment & Plan: Check tsh.    Post-traumatic headache, not intractable, unspecified chronicity  pattern Assessment & Plan: Head pain as outlined.  Pain (localized left posterior head) when applying pressure on that side - lying down on that side.  Pulling sensation in same area with lying on right side.  Previous CT negative.  Discussed further evaluation.  Has f/u with neurology.    History of CVA (cerebrovascular accident) Assessment & Plan: Continue plavix. Has been on lipitor.   Follow lipid panel.    LGSIL Pap smear of vagina Assessment & Plan: Has been followed by gyn.  Recently evaluated by Dr Amalia Hailey.  Per gyn: Abnormal Paps of the vaginal cuff were discussed. Patient previously had LGSIL but a negative colposcopy.  Based on her low risk they discussed the possibility of a repeat Pap or simply considering her aged out of Pap smears because her risk of vaginal cancer is very low.  Per review of note, they reported she would like consider these options.  Have discussed f/u pap.  She will notify gyn if desires.    Major depressive disorder, single episode, mild ([place]) Assessment & Plan:  Increased stress as outlined.  Discussed.  Declines any further intervention at this time.  Follow.    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, involving unspecified site [facility]) Assessment & Plan: On MTX.  Seeing rheumatology (Dr DeFoor).  Stable.    Other orders -     Acyclovir; Take 1 capsule (200 mg total) by mouth 2 (two) times daily.  Dispense: 180 capsule; Refill: 3 -     amLODIPine Besylate; Take 1 tablet (2.5 mg total) by mouth daily.  Dispense: 90 tablet; Refill: 1 -     Clopidogrel Bisulfate; Take 1 tablet (75 mg total) by mouth daily.  Dispense: 90 tablet; Refill: 1 -     Levothyroxine Sodium; Take 1 tablet (50 mcg total) by mouth daily before breakfast.  Dispense: 90 tablet; Refill: 1     Einar Pheasant, MD

## 2023-02-14 ENCOUNTER — Encounter: Payer: Self-pay | Admitting: Internal Medicine

## 2023-02-14 NOTE — Assessment & Plan Note (Signed)
Increased stress.  Discussed.  Discussed referral to therapist or psychiatry.  She declines at this time.  Declines any further intervention.  Follow.

## 2023-02-14 NOTE — Assessment & Plan Note (Signed)
On thyroid replacement.  Follow tsh.  

## 2023-02-14 NOTE — Assessment & Plan Note (Signed)
Check tsh 

## 2023-02-14 NOTE — Assessment & Plan Note (Signed)
Continue amlodipine.  Blood pressure as outlined.  Follow pressures.  Follow metabolic panel.

## 2023-02-14 NOTE — Assessment & Plan Note (Addendum)
Has been followed by gyn.  Recently evaluated by Dr Amalia Hailey.  Per gyn: Abnormal Paps of the vaginal cuff were discussed. Patient previously had LGSIL but a negative colposcopy.  Based on her low risk they discussed the possibility of a repeat Pap or simply considering her aged out of Pap smears because her risk of vaginal cancer is very low.  Per review of note, they reported she would like consider these options.  Have discussed f/u pap.  She will notify gyn if desires.

## 2023-02-14 NOTE — Assessment & Plan Note (Signed)
Head pain as outlined.  Pain (localized left posterior head) when applying pressure on that side - lying down on that side.  Pulling sensation in same area with lying on right side.  Previous CT negative.  Discussed further evaluation.  Has f/u with neurology.

## 2023-02-14 NOTE — Assessment & Plan Note (Signed)
Blood pressures have been doing well.  Continue current medication regimen.  Follow pressures.  Follow metabolic panel.

## 2023-02-14 NOTE — Assessment & Plan Note (Signed)
Continue protonix.  No acid reflux reported.  Follow.

## 2023-02-14 NOTE — Assessment & Plan Note (Signed)
Previous ultrasound revealed fatty liver.  Recent liver panel wnl.

## 2023-02-14 NOTE — Assessment & Plan Note (Signed)
Still some limitations.  Continue exercise.  Follow.

## 2023-02-14 NOTE — Assessment & Plan Note (Signed)
On MTX.  Seeing rheumatology (Dr DeFoor).  Stable.

## 2023-02-14 NOTE — Assessment & Plan Note (Signed)
On lipitor.  Follow lipid panel and liver function tests.   

## 2023-02-14 NOTE — Assessment & Plan Note (Signed)
Check urine to confirm no infection.  

## 2023-02-14 NOTE — Assessment & Plan Note (Signed)
Increased stress as outlined.  Discussed.  Declines any further intervention at this time.  Follow.

## 2023-02-14 NOTE — Assessment & Plan Note (Signed)
Continue plavix. Has been on lipitor.   Follow lipid panel.

## 2023-02-15 ENCOUNTER — Other Ambulatory Visit: Payer: Self-pay | Admitting: Internal Medicine

## 2023-02-16 DIAGNOSIS — G3184 Mild cognitive impairment, so stated: Secondary | ICD-10-CM | POA: Diagnosis not present

## 2023-02-16 DIAGNOSIS — R296 Repeated falls: Secondary | ICD-10-CM | POA: Diagnosis not present

## 2023-02-16 DIAGNOSIS — R531 Weakness: Secondary | ICD-10-CM | POA: Diagnosis not present

## 2023-02-16 DIAGNOSIS — E538 Deficiency of other specified B group vitamins: Secondary | ICD-10-CM | POA: Diagnosis not present

## 2023-02-16 DIAGNOSIS — Z79899 Other long term (current) drug therapy: Secondary | ICD-10-CM | POA: Diagnosis not present

## 2023-02-16 DIAGNOSIS — R42 Dizziness and giddiness: Secondary | ICD-10-CM | POA: Diagnosis not present

## 2023-02-16 DIAGNOSIS — I69998 Other sequelae following unspecified cerebrovascular disease: Secondary | ICD-10-CM | POA: Diagnosis not present

## 2023-02-16 DIAGNOSIS — M0609 Rheumatoid arthritis without rheumatoid factor, multiple sites: Secondary | ICD-10-CM | POA: Diagnosis not present

## 2023-02-16 DIAGNOSIS — R55 Syncope and collapse: Secondary | ICD-10-CM | POA: Diagnosis not present

## 2023-02-23 ENCOUNTER — Telehealth: Payer: Self-pay | Admitting: Internal Medicine

## 2023-02-23 ENCOUNTER — Other Ambulatory Visit: Payer: Self-pay | Admitting: Neurology

## 2023-02-23 DIAGNOSIS — G3184 Mild cognitive impairment, so stated: Secondary | ICD-10-CM

## 2023-02-23 NOTE — Telephone Encounter (Signed)
Barbara from [facility] called wanting  to know the update on a medical clearance form for blood thinners for pt and when she can stop taking them. They faxed the form on 2/21. Pt is having a dental extraction

## 2023-02-24 ENCOUNTER — Telehealth: Payer: Self-pay

## 2023-02-24 NOTE — Telephone Encounter (Signed)
Medical clearance form not received. Patient is aware. Provided information for her to refax form.

## 2023-02-24 NOTE — Telephone Encounter (Signed)
Form faxed to [facility].

## 2023-02-24 NOTE — Telephone Encounter (Signed)
Form completed and signed and placed in box.  Discussed with pt regarding risk of stopping plavix.  She is on plavix for stroke prevention with history of previous CVA.  Given her CVA - 2018 and recent scans have not revealed recurrent CVA - feel at low risk to proceed.  Will hold plavix two days prior to procedure with plans to restart the day after procedure.  Pt aware of risk and agreeable to proceed.

## 2023-02-24 NOTE — Telephone Encounter (Signed)
We received a Medical Clearance for Dental Treatment via fax from [facility].  I sent a copy to Dr. Randell Patient Scott's folder on the S drive and hand-delivered a copy to Roetta Sessions, LPN.

## 2023-02-24 NOTE — Telephone Encounter (Signed)
Form received for medical clearance needing to know if she should stop plavix prior to having dental extraction. Her appt is next week to have procedure.

## 2023-02-25 ENCOUNTER — Ambulatory Visit: Payer: PPO | Attending: Internal Medicine | Admitting: Internal Medicine

## 2023-02-25 ENCOUNTER — Encounter: Payer: Self-pay | Admitting: Internal Medicine

## 2023-02-25 VITALS — BP 132/72 | HR 66 | Ht 64.0 in | Wt 100.6 lb

## 2023-02-25 DIAGNOSIS — I493 Ventricular premature depolarization: Secondary | ICD-10-CM | POA: Diagnosis not present

## 2023-02-25 DIAGNOSIS — I639 Cerebral infarction, unspecified: Secondary | ICD-10-CM | POA: Diagnosis not present

## 2023-02-25 NOTE — Progress Notes (Signed)
HPI Donna Rhodes returns today for followup after undergoing ILR removal a couple of years ago. 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 She initially had an ILR inserted but had no evidence of atrial fib. She remains quite anxious and worried about having another stroke. She notes occaisional palpitations which were due to PVC's and PAC's when she had an ILR. She wants to know if she needs to continue the plavix. She is anxious about her palpitations. She has PAC's and PVC's.  Allergies  Allergen Reactions   Amoxicillin Other (See Comments)    Has patient had a PCN reaction causing immediate rash, facial/tongue/throat swelling, SOB or lightheadedness with hypotension: No Has patient had a PCN reaction causing severe rash involving mucus membranes or skin necrosis: No Has patient had a PCN reaction that required hospitalization: No Has patient had a PCN reaction occurring within the last 10 years: No If all of the above answers are "NO", then may proceed with Cephalosporin use.   Irritated tongue   Avelox [Moxifloxacin Hcl In Nacl]     Tongue irritation   Azithromycin Other (See Comments)    Caused mouth and tongue to be sore   Bacitracin     Itching and rash    Codeine     "feels like I'm in a barrel"   Estro[employer] Weight Management [Nutritional Supplements]     Mouth soreness   Fish Oil     Other reaction(s): Other (See Comments) Mouth soreness Mouth soreness   Food     Walnuts --mouth breaks out   Moxifloxacin Other (See Comments)    Tongue irritation   Naproxen     Tongue irritation   Nsaids Other (See Comments)    Oral inflammation   Cefepime Rash     Current Outpatient Medications  Medication Sig Dispense Refill   acetaminophen (TYLENOL) 500 MG tablet Take 1-2 tablets (500-1,000 mg total) by mouth every 6 (six) hours as needed for mild pain. 30 tablet 0   acyclovir (ZOVIRAX) 200  MG capsule Take 1 capsule (200 mg total) by mouth 2 (two) times daily. 180 capsule 3   amLODipine (NORVASC) 2.5 MG tablet Take 1 tablet (2.5 mg total) by mouth daily. 90 tablet 1   atorvastatin (LIPITOR) 20 MG tablet TAKE 1 TABLET BY MOUTH DAILY 90 tablet 0   calcium-vitamin D (OSCAL WITH D) 250-125 MG-UNIT tablet Take 1 tablet by mouth daily.     clopidogrel (PLAVIX) 75 MG tablet Take 1 tablet (75 mg total) by mouth daily. 90 tablet 1   folic acid (FOLVITE) 1 MG tablet Take 1 mg by mouth daily.     ketoconazole (NIZORAL) 2 % shampoo Apply 1 application topically 2 (two) times a week. massage into scalp and leave in for 5 - 10 minutes before rinsing out 120 mL 12   levothyroxine (SYNTHROID) 50 MCG tablet Take 1 tablet (50 mcg total) by mouth daily before breakfast. 90 tablet 1   methotrexate 2.5 MG tablet Take 10 mg by mouth once a week.     No current facility-administered medications for this visit.     Past Medical History:  Diagnosis Date   Atrophic vaginitis    Dysplasia of vagina    vin 1 - vulva and vaginal cuff   GERD (gastroesophageal reflux disease)    HCV infection    History of basal cell carcinoma (  BCC) 04/11/2019   right lat lower eyelid margin Mohs 2020 Dr. Lacinda Axon   HSV (herpes simplex virus) infection    Hypercholesterolemia    Hypertension    Hypothyroidism    Left-sided weakness    S/P CVA 4/18.  PT 2x/wk.   Lung nodule    stable   Menopausal state    Osteoarthritis    hand involvement, cervical disc disease   Osteoporosis    Pelvic pain in female    Rheumatoid arthritis ([place])    Stroke (cerebrum) ([place]) 04/24/2017   Thyroid nodule    pt unaware of this    Vaginal Pap smear, abnormal    lgsil    ROS:   All systems reviewed and negative except as noted in the HPI.   Past Surgical History:  Procedure Laterality Date   [place]   left   CATARACT EXTRACTION W/PHACO Right 10/27/2017   Procedure:  CATARACT EXTRACTION PHACO AND INTRAOCULAR LENS PLACEMENT ([place]) RIGHT;  Surgeon: Leandrew Koyanagi, MD;  Location: [facility];  Service: Ophthalmology;  Laterality: Right;  requests early (8-8:30 arrival)   CATARACT EXTRACTION W/PHACO Left 11/10/2017   Procedure: CATARACT EXTRACTION PHACO AND INTRAOCULAR LENS PLACEMENT ([place]) LEFT;  Surgeon: Leandrew Koyanagi, MD;  Location: [facility];  Service: Ophthalmology;  Laterality: Left;   CHOLECYSTECTOMY     FINGER SURGERY  1982   laser vein surgery     LOOP RECORDER INSERTION N/A 09/24/2017   Procedure: LOOP RECORDER INSERTION;  Surgeon: Evans Lance, MD;  Location: [facility];  Service: Cardiovascular;  Laterality: N/A;   MANDIBLE RECONSTRUCTION  1992   TOENAIL EXCISION  1998   several   TONSILLECTOMY     TUBAL LIGATION  1977   VAGINAL HYSTERECTOMY  1983   als0- anterior/posterior colporrhaphy   VAGINAL WOUND CLOSURE / REPAIR  1999, 2000   VAGINAL WOUND CLOSURE / REPAIR       Family History  Problem Relation Age of Onset   Hypertension Father    Dementia Father    Osteoarthritis Mother    Arthritis/Rheumatoid Sister    Breast cancer Other        paternal side   Breast cancer Paternal Aunt    Breast cancer Cousin        maternal   Uterine cancer Maternal Grandmother    Ovarian cancer Neg Hx    Heart disease Neg Hx    Diabetes Neg Hx    Colon cancer Neg Hx      Social History   Socioeconomic History   Marital status: Divorced    Spouse name: Not on file   Number of children: 3   Years of education: 12   Highest education level: Not on file  Occupational History    Comment: retired  Tobacco Use   Smoking status: Former    Types: Cigarettes    Quit date: 12/29/1979    Years since quitting: 43.1   Smokeless tobacco: Never  Vaping Use   Vaping Use: Never used  Substance and Sexual Activity   Alcohol use: No    Alcohol/week: 0.0 standard drinks of alcohol   Drug use: No   Sexual activity:  Not Currently    Birth control/protection: Surgical  Other Topics Concern   Not on file  Social History Narrative   Her mother and her daughter live with her   Caffeine- 1 cup coffee   Social  Determinants of Health   Financial Resource Strain: Low Risk  (02/24/2021)   Overall Financial Resource Strain (CARDIA)    Difficulty of Paying Living Expenses: Not hard at all  Food Insecurity: No Food Insecurity (02/24/2021)   Hunger Vital Sign    Worried About Running Out of Food in the Last Year: Never true    Ran Out of Food in the Last Year: Never true  Transportation Needs: No Transportation Needs (02/24/2021)   PRAPARE - Hydrologist (Medical): No    Lack of Transportation (Non-Medical): No  Physical Activity: Not on file  Stress: No Stress Concern Present (02/24/2021)   [place]    Feeling of Stress : Only a little  Social Connections: Unknown (02/24/2021)   Social Connection and Isolation Panel [NHANES]    Frequency of Communication with Friends and Family: More than three times a week    Frequency of Social Gatherings with Friends and Family: More than three times a week    Attends Religious Services: Not on file    Active Member of [place] or Organizations: Not on file    Attends Archivist Meetings: Not on file    Marital Status: Not on file  Intimate Partner Violence: Not At Risk (02/24/2021)   Humiliation, Afraid, Rape, and Kick questionnaire    Fear of Current or Ex-Partner: No    Emotionally Abused: No    Physically Abused: No    Sexually Abused: No     BP 132/72   Pulse 66   Ht '5\' 4"'$  (1.626 m)   Wt 100 lb 9.6 oz (45.6 kg)   LMP 12/29/1981   SpO2 96%   BMI 17.27 kg/m   Physical Exam:  Well appearing NAD HEENT: Unremarkable Neck:  No JVD, no thyromegally Lymphatics:  No adenopathy Back:  No CVA tenderness Lungs:  Clear with no wheeze HEART:  Regular rate  rhythm, no murmurs, no rubs, no clicks Abd:  soft, positive bowel sounds, no organomegally, no rebound, no guarding Ext:  2 plus pulses, no edema, no cyanosis, no clubbing; left HP of arm Skin:  No rashes no nodules Neuro:  CN II through XII intact, motor grossly intact   Assess/Plan: Palpitations - she is fairly well controlled. I discussed the option of adding flecainide but she is not symptomatic enough at this time. Cryptogenic stroke - no obvious etiology. She will continue plavix. Carleene Overlie Heather Streeper,MD

## 2023-02-25 NOTE — Patient Instructions (Addendum)
Medication Instructions:  Your physician recommends that you continue on your current medications as directed. Please refer to the Current Medication list given to you today.  *If you need a refill on your cardiac medications before your next appointment, please call your pharmacy* NOTE:  Please call our office if your palpitations worsen, we will start your on an antiarrhythmic medication.  [phone].  Lab Work: None ordered.  If you have labs (blood work) drawn today and your tests are completely normal, you will receive your results only by: [place] (if you have MyChart) OR A paper copy in the mail If you have any lab test that is abnormal or we need to change your treatment, we will call you to review the results.  Testing/Procedures: None ordered.  Follow-Up: At [facility], you and your health needs are our priority.  As part of our continuing mission to provide you with exceptional heart care, we have created designated Provider Care Teams.  These Care Teams include your primary Cardiologist (physician) and Advanced Practice Providers (APPs -  Physician Assistants and Nurse Practitioners) who all work together to provide you with the care you need, when you need it.  We recommend signing up for the patient portal called "MyChart".  Sign up information is provided on this After Visit Summary.  MyChart is used to connect with patients for Virtual Visits (Telemedicine).  Patients are able to view lab/test results, encounter notes, upcoming appointments, etc.  Non-urgent messages can be sent to your provider as well.   To learn more about what you can do with MyChart, go to [email or web address].    Your next appointment:   1 year(s)  The format for your next appointment:   In Person  Provider:   Cristopher Peru, MD{or one of the following Advanced Practice Providers on your designated Care Team:   Tommye Standard, [place] Legrand Como "Jonni Sanger" Chalmers Cater, [place]    Important  Information About Sugar

## 2023-02-27 ENCOUNTER — Ambulatory Visit
Admission: RE | Admit: 2023-02-27 | Discharge: 2023-02-27 | Disposition: A | Discharge status: Home / Self Care | Payer: PPO | Source: Ambulatory Visit | Attending: Neurology | Admitting: Neurology

## 2023-02-27 DIAGNOSIS — G3184 Mild cognitive impairment, so stated: Secondary | ICD-10-CM

## 2023-02-27 DIAGNOSIS — I639 Cerebral infarction, unspecified: Secondary | ICD-10-CM | POA: Diagnosis not present

## 2023-03-03 ENCOUNTER — Telehealth: Payer: Self-pay | Admitting: Internal Medicine

## 2023-03-03 NOTE — Telephone Encounter (Signed)
Patient called to let Dr Nicki Reaper know she is back on her clopidogrel (PLAVIX) 75 MG tablet. She started it on Monday. She did not have the surgery and will let Dr Nicki Reaper know when her surgery will be.

## 2023-03-03 NOTE — Telephone Encounter (Signed)
FYI- patient did not have procedure done and has restarted her plavix.

## 2023-03-03 NOTE — Telephone Encounter (Signed)
Noted.  Continue plavix.  Let me know if any question.

## 2023-03-03 NOTE — Telephone Encounter (Signed)
Noted  

## 2023-04-05 ENCOUNTER — Encounter: Payer: Self-pay | Admitting: Dermatology

## 2023-04-05 ENCOUNTER — Ambulatory Visit: Payer: PPO | Admitting: Dermatology

## 2023-04-05 VITALS — BP 130/72

## 2023-04-05 DIAGNOSIS — Z79899 Other long term (current) drug therapy: Secondary | ICD-10-CM | POA: Diagnosis not present

## 2023-04-05 DIAGNOSIS — L219 Seborrheic dermatitis, unspecified: Secondary | ICD-10-CM | POA: Diagnosis not present

## 2023-04-05 DIAGNOSIS — L821 Other seborrheic keratosis: Secondary | ICD-10-CM | POA: Diagnosis not present

## 2023-04-05 DIAGNOSIS — D692 Other nonthrombocytopenic purpura: Secondary | ICD-10-CM

## 2023-04-05 DIAGNOSIS — L578 Other skin changes due to chronic exposure to nonionizing radiation: Secondary | ICD-10-CM

## 2023-04-05 DIAGNOSIS — Z7189 Other specified counseling: Secondary | ICD-10-CM | POA: Diagnosis not present

## 2023-04-05 DIAGNOSIS — L820 Inflamed seborrheic keratosis: Secondary | ICD-10-CM

## 2023-04-05 DIAGNOSIS — L650 Telogen effluvium: Secondary | ICD-10-CM | POA: Diagnosis not present

## 2023-04-05 MED ORDER — KETOCONAZOLE 2 % EX SHAM: 1.0000 | MEDICATED_SHAMPOO | Freq: Once | CUTANEOUS | 6 refills | Status: AC

## 2023-04-05 NOTE — Patient Instructions (Addendum)
Recommend Biotin 2.5mg  daily Start Rogain to scalp daily  Telogen Effluvium Counseling Telogen effluvium is a benign, self-limited condition causing increased hair shedding usually for several months. It does not progress to baldness, and the hair eventually grows back on its own. It can be triggered by recent illness, recent surgery, thyroid disease, low iron stores, vitamin D deficiency, fad diets or rapid weight loss, hormonal changes such as pregnancy or birth control pills, and some medication. Usually the hair loss starts 2-3 months after the illness or health change. Rarely, it can continue for longer than a year. Treatments options may include oral or topical Minoxidil; Red Light scalp treatments; Biotin 2.5 mg daily and other options.    Cryotherapy Aftercare  Wash gently with soap and water everyday.   Apply Vaseline and Band-Aid daily until healed.   Due to recent changes in healthcare laws, you may see results of your pathology and/or laboratory studies on MyChart before the doctors have had a chance to review them. We understand that in some cases there may be results that are confusing or concerning to you. Please understand that not all results are received at the same time and often the doctors may need to interpret multiple results in order to provide you with the best plan of care or course of treatment. Therefore, we ask that you please give [place] 2 business days to thoroughly review all your results before contacting the office for clarification. Should we see a critical lab result, you will be contacted sooner.   If You Need Anything After Your Visit  If you have any questions or concerns for your doctor, please call our main line at [phone] and press option 4 to reach your doctor's medical assistant. If no one answers, please leave a voicemail as directed and we will return your call as soon as possible. Messages left after 4 pm will be answered the following business day.    You may also send [place] a message via MyChart. We typically respond to MyChart messages within 1-2 business days.  For prescription refills, please ask your pharmacy to contact our office. Our fax number is [phone].  If you have an urgent issue when the clinic is closed that cannot wait until the next business day, you can page your doctor at the number below.    Please note that while we do our best to be available for urgent issues outside of office hours, we are not available 24/7.   If you have an urgent issue and are unable to reach [place], you may choose to seek medical care at your doctor's office, retail clinic, urgent care center, or emergency room.  If you have a medical emergency, please immediately call 911 or go to the emergency department.  Pager Numbers  - Dr. Gwen Pounds: [phone]  - Dr. Neale Burly: [phone]  - Dr. Roseanne Reno: [phone]  In the event of inclement weather, please call our main line at [phone] for an update on the status of any delays or closures.  Dermatology Medication Tips: Please keep the boxes that topical medications come in in order to help keep track of the instructions about where and how to use these. Pharmacies typically print the medication instructions only on the boxes and not directly on the medication tubes.   If your medication is too expensive, please contact our office at [phone] option 4 or send [place] a message through MyChart.   We are unable to tell what your co-pay for medications will be  in advance as this is different depending on your insurance coverage. However, we may be able to find a substitute medication at lower cost or fill out paperwork to get insurance to cover a needed medication.   If a prior authorization is required to get your medication covered by your insurance company, please allow [place] 1-2 business days to complete this process.  Drug prices often vary depending on where the prescription is filled and some  pharmacies may offer cheaper prices.  The website www.goodrx.com contains coupons for medications through different pharmacies. The prices here do not account for what the cost may be with help from insurance (it may be cheaper with your insurance), but the website can give you the price if you did not use any insurance.  - You can print the associated coupon and take it with your prescription to the pharmacy.  - You may also stop by our office during regular business hours and pick up a GoodRx coupon card.  - If you need your prescription sent electronically to a different pharmacy, notify our office through [facility] or by phone at [phone] option 4.     Si Usted Necesita Algo Despus de Su Visita  Tambin puede enviarnos un mensaje a travs de Pharmacist, community. Por lo general respondemos a los mensajes de MyChart en el transcurso de 1 a 2 das hbiles.  Para renovar recetas, por favor pida a su farmacia que se ponga en contacto con nuestra oficina. Harland Dingwall de fax es [place] [phone].  Si tiene un asunto urgente cuando la clnica est cerrada y que no puede esperar hasta el siguiente da hbil, puede llamar/localizar a su doctor(a) al nmero que aparece a continuacin.   Por favor, tenga en cuenta que aunque hacemos todo lo posible para estar disponibles para asuntos urgentes fuera del horario de [place], no estamos disponibles las 24 horas del da, los 7 das de la [place].   Si tiene un problema urgente y no puede comunicarse con nosotros, puede optar por buscar atencin mdica  en el consultorio de su doctor(a), en una clnica privada, en un centro de atencin urgente o en una sala de emergencias.  Si tiene Engineering geologist, por favor llame inmediatamente al 911 o vaya a la sala de emergencias.  Nmeros de bper  - Dr. Nehemiah Massed: [phone]  - Dra. Moye: [phone]  - Dra. Nicole Kindred: [phone]  En caso de inclemencias del [place], por favor llame a Johnsie Kindred  principal al [phone] para una actualizacin sobre el [place] de cualquier retraso o cierre.  Consejos para la medicacin en dermatologa: Por favor, guarde las cajas en las que vienen los medicamentos de uso tpico para ayudarle a seguir las instrucciones sobre dnde y cmo usarlos. Las farmacias generalmente imprimen las instrucciones del medicamento slo en las cajas y no directamente en los tubos del [place].   Si su medicamento es muy caro, por favor, pngase en contacto con Zigmund Daniel llamando al [phone] y presione la opcin 4 o envenos un mensaje a travs de Pharmacist, community.   No podemos decirle cul ser su copago por los medicamentos por adelantado ya que esto es diferente dependiendo de la cobertura de su seguro. Sin embargo, es posible que podamos encontrar un medicamento sustituto a Electrical engineer un formulario para que el seguro cubra el medicamento que se considera necesario.   Si se requiere una autorizacin previa para que su compaa de seguros [place] su medicamento, por favor permtanos de 1 a  2 das hbiles para completar [address]  Los precios de los medicamentos varan con frecuencia dependiendo del Environmental consultant de dnde se surte la receta y alguna farmacias pueden ofrecer precios ms baratos.  El sitio web www.goodrx.com tiene cupones para medicamentos de Health and safety inspector. Los precios aqu no tienen en cuenta lo que podra costar con la ayuda del seguro (puede ser ms barato con su seguro), pero el sitio web puede darle el precio si no utiliz Tourist information centre manager.  - Puede imprimir el cupn correspondiente y llevarlo con su receta a la farmacia.  - Tambin puede pasar por nuestra oficina durante el horario de atencin regular y Education officer, museum una tarjeta de cupones de GoodRx.  - Si necesita que su receta se enve electrnicamente a una farmacia diferente, informe a nuestra oficina a travs de MyChart de [place] o por telfono llamando al [phone] y presione la opcin  4.

## 2023-04-05 NOTE — Progress Notes (Signed)
Follow-Up Visit   Subjective  Donna Rhodes is a 77 y.o. female who presents for the following: check spots legs, irritating, hair thinning, 2-3 months, pt declines TBSE today The patient has spots, moles and lesions to be evaluated, some may be new or changing and the patient has concerns that these could be cancer.  The following portions of the chart were reviewed this encounter and updated as appropriate: medications, allergies, medical history  Review of Systems:  No other skin or systemic complaints except as noted in HPI or Assessment and Plan.  Objective  Well appearing patient in no apparent distress; mood and affect are within normal limits. A focused examination was performed of the following areas: Face, arms, legs Relevant exam findings are noted in the Assessment and Plan.   Assessment & Plan   ACTINIC DAMAGE - chronic, secondary to cumulative UV radiation exposure/sun exposure over time - diffuse scaly erythematous macules with underlying dyspigmentation - Recommend daily broad spectrum sunscreen SPF 30+ to sun-exposed areas, reapply every 2 hours as needed.  - Recommend staying in the shade or wearing long sleeves, sun glasses (UVA+UVB protection) and wide brim hats (4-inch brim around the entire circumference of the hat). - Call for new or changing lesions.   INFLAMED SEBORRHEIC KERATOSIS Exam: Erythematous keratotic or waxy stuck-on papule or plaque.  Symptomatic, irritating, patient would like treated.  Benign-appearing.  Call clinic for new or changing lesions.   Prior to procedure, discussed risks of blister formation, small wound, skin dyspigmentation, or rare scar following treatment. Recommend Vaseline ointment to treated areas while healing.  Destruction Procedure Note Destruction method: cryotherapy   Informed consent: discussed and consent obtained   Lesion destroyed using liquid nitrogen: Yes   Outcome: patient tolerated procedure well with no  complications   Post-procedure details: wound care instructions given   Locations: L leg x 11, R leg x 4, R arm x 2 # of Lesions Treated: 17  SEBORRHEIC KERATOSIS - Stuck-on, waxy, tan-brown papules and/or plaques  - Benign-appearing - Discussed benign etiology and prognosis. - Observe - Call for any changes  Purpura - Chronic; persistent and recurrent.  Treatable, but not curable. - Violaceous macules and patches - Benign - Related to trauma, age, sun damage and/or use of blood thinners, chronic use of topical and/or oral steroids - Observe - Can use OTC arnica containing moisturizer such as Dermend Bruise Formula if desired - Call for worsening or other concerns   TELOGEN EFFLUVIUM associated with stress Exam: Diffuse thinning of hair, positive hair pull test.  Telogen effluvium is a benign, self-limited condition causing increased hair shedding usually for several months. It does not progress to baldness, and the hair eventually grows back on its own. It can be triggered by recent illness, recent surgery, thyroid disease, low iron stores, vitamin D deficiency, fad diets or rapid weight loss, hormonal changes such as pregnancy or birth control pills, and some medication. Usually the hair loss starts 2-3 months after the illness or health change. Rarely, it can continue for longer than a year. Treatments options may include oral or topical Minoxidil; Red Light scalp treatments; Biotin 2.5 mg daily and other options.  Treatment Plan: Recommend Biotin 2.5mg  qd Start topical Rogaine to scalp daily - bid  SEBORRHEIC DERMATITIS Exam: Pink patches with greasy scale Seborrheic Dermatitis is a chronic persistent rash characterized by pinkness and scaling most commonly of the mid face but also can occur on the scalp (dandruff), ears; mid chest, mid  back and groin.  It tends to be exacerbated by stress and cooler weather.  People who have neurologic disease may experience new onset or  exacerbation of existing seborrheic dermatitis.  The condition is not curable but treatable and can be controlled.  Treatment Plan: Restart Ketoconazole 2% shampoo 3 days a week to scalp, let sit 5 minutes and rinse out.   Return if symptoms worsen or fail to improve.  I, Ardis Rowan, RMA, am acting as scribe for Armida Sans, MD .  Documentation: I have reviewed the above documentation for accuracy and completeness, and I agree with the above.  Armida Sans, MD

## 2023-04-07 DIAGNOSIS — L03116 Cellulitis of left lower limb: Secondary | ICD-10-CM | POA: Diagnosis not present

## 2023-04-07 DIAGNOSIS — S81802A Unspecified open wound, left lower leg, initial encounter: Secondary | ICD-10-CM | POA: Diagnosis not present

## 2023-04-14 ENCOUNTER — Telehealth: Payer: Self-pay | Admitting: Internal Medicine

## 2023-04-14 ENCOUNTER — Encounter: Payer: Self-pay | Admitting: Dermatology

## 2023-04-14 NOTE — Telephone Encounter (Signed)
FYI   Spoke with patient. She is still having issues with her cut on her leg (happened 4/3) and feels like the doxycycline that she was given has made her eye red and irritated. Confirmed no lip swelling, tongue swelling, trouble breathing, etc. Patient says her leg is getting better but still red and swollen. She is scheduled to see you tomorrow but wanted you to be aware of what is going on. She is going to stop doxy until her appointment tomorrow. She is keeping the cut clean and dry. Elevating at night. No increased pain.

## 2023-04-14 NOTE — Telephone Encounter (Signed)
Patient says she thinks she will be ok until tomorrow- no pain but if something changes agreed to go to urgent care

## 2023-04-14 NOTE — Telephone Encounter (Signed)
If eye acutely swollen and especially if pain, would recommend evaluation today.

## 2023-04-14 NOTE — Telephone Encounter (Signed)
Pt would like to be called regarding her appointment on tomorrow. Pt scraped her left leg beside her shinbone and it bled. pt went to [facility] and they prescibed doxcycline and now she has right eye swollen like a bee stung her

## 2023-04-15 ENCOUNTER — Ambulatory Visit (INDEPENDENT_AMBULATORY_CARE_PROVIDER_SITE_OTHER): Payer: PPO | Admitting: Internal Medicine

## 2023-04-15 ENCOUNTER — Ambulatory Visit: Payer: PPO | Admitting: Dermatology

## 2023-04-15 ENCOUNTER — Encounter: Payer: Self-pay | Admitting: Internal Medicine

## 2023-04-15 VITALS — BP 120/78 | HR 73 | Temp 97.3°F | Resp 17 | Ht 64.0 in | Wt 101.5 lb

## 2023-04-15 DIAGNOSIS — K219 Gastro-esophageal reflux disease without esophagitis: Secondary | ICD-10-CM | POA: Diagnosis not present

## 2023-04-15 DIAGNOSIS — M1990 Unspecified osteoarthritis, unspecified site: Secondary | ICD-10-CM | POA: Diagnosis not present

## 2023-04-15 DIAGNOSIS — I10 Essential (primary) hypertension: Secondary | ICD-10-CM

## 2023-04-15 DIAGNOSIS — Z8673 Personal history of transient ischemic attack (TIA), and cerebral infarction without residual deficits: Secondary | ICD-10-CM | POA: Diagnosis not present

## 2023-04-15 DIAGNOSIS — H5789 Other specified disorders of eye and adnexa: Secondary | ICD-10-CM | POA: Diagnosis not present

## 2023-04-15 DIAGNOSIS — F32A Depression, unspecified: Secondary | ICD-10-CM

## 2023-04-15 DIAGNOSIS — M0600 Rheumatoid arthritis without rheumatoid factor, unspecified site: Secondary | ICD-10-CM | POA: Diagnosis not present

## 2023-04-15 DIAGNOSIS — E039 Hypothyroidism, unspecified: Secondary | ICD-10-CM

## 2023-04-15 DIAGNOSIS — E7800 Pure hypercholesterolemia, unspecified: Secondary | ICD-10-CM

## 2023-04-15 DIAGNOSIS — L989 Disorder of the skin and subcutaneous tissue, unspecified: Secondary | ICD-10-CM

## 2023-04-15 MED ORDER — MUPIROCIN 2 % EX OINT
1.0000 | TOPICAL_OINTMENT | Freq: Two times a day (BID) | CUTANEOUS | 0 refills | Status: DC
Start: 2023-04-15 — End: 2023-07-31

## 2023-04-15 NOTE — Progress Notes (Signed)
Subjective:    Patient ID: Donna Rhodes, female    DOB: Mar 25, 1946, 77 y.o.   MRN: [id number]   HPI Here as a work in - scraped her leg on a bush - 03/31/23.  Medial side - left leg.  Evaluated 04/07/23 - acute care.  Diagnosed with cellulitis. Placed on doxycycline.  Noticed several days ago - some redness around her right eye.  She was questioning if could be a reaction to the doxycycline.  Also was questioning if allergies.  States she was working outside.  Felt may have got pollen in her eye.  No pain in the eye.  No vision change.  Has been applying cold cloth to her eye. Describes itching - corner of eye. She did hit above her eye - on a cabinet.  Bruised - resolving.  No pain with eye movement.  No fever.  Eating.  No vomiting.  Breathing stable.  Persistent abrasion - lower leg - healing.     Past Medical History:  Diagnosis Date   Actinic keratosis    Atrophic vaginitis    Dysplasia of vagina    vin 1 - vulva and vaginal cuff   GERD (gastroesophageal reflux disease)    HCV infection    History of basal cell carcinoma (BCC) 04/11/2019   right lat lower eyelid margin Mohs 2020 Dr. Adriana Simas   HSV (herpes simplex virus) infection    Hypercholesterolemia    Hypertension    Hypothyroidism    Left-sided weakness    S/P CVA 4/18.  PT 2x/wk.   Lung nodule    stable   Menopausal state    Osteoarthritis    hand involvement, cervical disc disease   Osteoporosis    Pelvic pain in female    Rheumatoid arthritis    Stroke (cerebrum) 04/24/2017   Thyroid nodule    pt unaware of this    Vaginal Pap smear, abnormal    lgsil   Past Surgical History:  Procedure Laterality Date   ADENOIDECTOMY     APPENDECTOMY  1965   BUNIONECTOMY  1994   left   CATARACT EXTRACTION W/PHACO Right 10/27/2017   Procedure: CATARACT EXTRACTION PHACO AND INTRAOCULAR LENS PLACEMENT (IOC) RIGHT;  Surgeon: Lockie Mola, MD;  Location: [facility];  Service: Ophthalmology;  Laterality: Right;   requests early (8-8:30 arrival)   CATARACT EXTRACTION W/PHACO Left 11/10/2017   Procedure: CATARACT EXTRACTION PHACO AND INTRAOCULAR LENS PLACEMENT (IOC) LEFT;  Surgeon: Lockie Mola, MD;  Location: [facility];  Service: Ophthalmology;  Laterality: Left;   CHOLECYSTECTOMY     FINGER SURGERY  1982   laser vein surgery     LOOP RECORDER INSERTION N/A 09/24/2017   Procedure: LOOP RECORDER INSERTION;  Surgeon: Marinus Maw, MD;  Location: [facility];  Service: Cardiovascular;  Laterality: N/A;   MANDIBLE RECONSTRUCTION  1992   TOENAIL EXCISION  1998   several   TONSILLECTOMY     TUBAL LIGATION  1977   VAGINAL HYSTERECTOMY  1983   als0- anterior/posterior colporrhaphy   VAGINAL WOUND CLOSURE / REPAIR  1999, 2000   VAGINAL WOUND CLOSURE / REPAIR     Family History  Problem Relation Age of Onset   Hypertension Father    Dementia Father    Osteoarthritis Mother    Arthritis/Rheumatoid Sister    Breast cancer Other        paternal side   Breast cancer Paternal Aunt    Breast cancer Cousin  maternal   Uterine cancer Maternal Grandmother    Ovarian cancer Neg Hx    Heart disease Neg Hx    Diabetes Neg Hx    Colon cancer Neg Hx    Social History   Socioeconomic History   Marital status: Divorced    Spouse name: Not on file   Number of children: 3   Years of education: 12   Highest education level: Not on file  Occupational History    Comment: retired  Tobacco Use   Smoking status: Former    Types: Cigarettes    Quit date: 12/29/1979    Years since quitting: 43.3   Smokeless tobacco: Never  Vaping Use   Vaping Use: Never used  Substance and Sexual Activity   Alcohol use: No    Alcohol/week: 0.0 standard drinks of alcohol   Drug use: No   Sexual activity: Not Currently    Birth control/protection: Surgical  Other Topics Concern   Not on file  Social History Narrative   Her mother and her daughter live with her   Caffeine- 1 cup coffee    Social Determinants of Health   Financial Resource Strain: Low Risk  (02/24/2021)   Overall Financial Resource Strain (CARDIA)    Difficulty of Paying Living Expenses: Not hard at all  Food Insecurity: No Food Insecurity (02/24/2021)   Hunger Vital Sign    Worried About Running Out of Food in the Last Year: Never true    Ran Out of Food in the Last Year: Never true  Transportation Needs: No Transportation Needs (02/24/2021)   PRAPARE - Transportation    Lack of Transportation (Medical): No    Lack of Transportation (Non-Medical): No  Physical Activity: Not on file  Stress: No Stress Concern Present (02/24/2021)   [employer] of Occupational Health - Occupational Stress Questionnaire    Feeling of Stress : Only a little  Social Connections: Unknown (02/24/2021)   Social Connection and Isolation Panel [NHANES]    Frequency of Communication with Friends and Family: More than three times a week    Frequency of Social Gatherings with Friends and Family: More than three times a week    Attends Religious Services: Not on Marketing executive or Organizations: Not on file    Attends Banker Meetings: Not on file    Marital Status: Not on file     Review of Systems  Constitutional:  Negative for appetite change and unexpected weight change.  HENT:  Negative for congestion and sinus pressure.   Respiratory:  Negative for cough, chest tightness and shortness of breath.   Cardiovascular:  Negative for chest pain and palpitations.  Gastrointestinal:  Negative for abdominal pain, diarrhea, nausea and vomiting.  Genitourinary:  Negative for difficulty urinating and dysuria.  Musculoskeletal:  Negative for joint swelling and myalgias.  Skin:        Leg abrasion - left leg.   Neurological:  Negative for dizziness and headaches.  Psychiatric/Behavioral:  Negative for agitation and dysphoric mood.        Objective:     BP 120/78   Pulse 73   Temp (!) 97.3 F  (36.3 C) (Oral)   Resp 17   Ht  (1.626 m)   Wt 101 lb 8 oz (46 kg)   LMP 12/29/1981   SpO2 97%   BMI 17.42 kg/m  Wt Readings from Last 3 Encounters:  04/15/23 101 lb 8 oz (46 kg)  02/25/23 100 lb 9.6 oz (45.6 kg)  02/10/23 97 lb 12.8 oz (44.4 kg)    Physical Exam Vitals reviewed.  Constitutional:      General: She is not in acute distress.    Appearance: Normal appearance.  HENT:     Head: Normocephalic and atraumatic.     Comments: Peri orbital region - increased redness.  No increased soft tissue swelling.  Resolving hematoma - above eye.  No pain.  EOMI intact.      Right Ear: External ear normal.     Left Ear: External ear normal.  Eyes:     General: No scleral icterus.       Right eye: No discharge.        Left eye: No discharge.     Conjunctiva/sclera: Conjunctivae normal.  Neck:     Thyroid: No thyromegaly.  Cardiovascular:     Rate and Rhythm: Normal rate and regular rhythm.  Pulmonary:     Effort: No respiratory distress.     Breath sounds: Normal breath sounds. No wheezing.  Abdominal:     General: Bowel sounds are normal.     Palpations: Abdomen is soft.     Tenderness: There is no abdominal tenderness.  Musculoskeletal:        General: No tenderness.     Cervical back: Neck supple. No tenderness.     Comments: Left lower leg - abrasion - scab (healing).  Venostasis changes noted.  No increased erythema or warmth.   Lymphadenopathy:     Cervical: No cervical adenopathy.  Skin:    Findings: No erythema or rash.  Neurological:     Mental Status: She is alert.  Psychiatric:        Mood and Affect: Mood normal.        Behavior: Behavior normal.      Outpatient Encounter Medications as of 04/15/2023  Medication Sig   acetaminophen (TYLENOL) 500 MG tablet Take 1-2 tablets (500-1,000 mg total) by mouth every 6 (six) hours as needed for mild pain.   acyclovir (ZOVIRAX) 200 MG capsule Take 1 capsule (200 mg total) by mouth 2 (two) times daily.    amLODipine (NORVASC) 2.5 MG tablet Take 1 tablet (2.5 mg total) by mouth daily.   atorvastatin (LIPITOR) 20 MG tablet TAKE 1 TABLET BY MOUTH DAILY   calcium-vitamin D (OSCAL WITH D) 250-125 MG-UNIT tablet Take 1 tablet by mouth daily.   Cholecalciferol (VITAMIN D3 PO) Take by mouth.   clopidogrel (PLAVIX) 75 MG tablet Take 1 tablet (75 mg total) by mouth daily.   folic acid (FOLVITE) 1 MG tablet Take 1 mg by mouth daily.   ketoconazole (NIZORAL) 2 % shampoo Apply 1 Application topically 2 (two) times a week.   levothyroxine (SYNTHROID) 50 MCG tablet Take 1 tablet (50 mcg total) by mouth daily before breakfast.   MAGNESIUM PO Take by mouth.   methotrexate 2.5 MG tablet Take 10 mg by mouth once a week.   mupirocin ointment (BACTROBAN) 2 % Apply 1 Application topically 2 (two) times daily.   vitamin B-12 (CYANOCOBALAMIN) 100 MCG tablet Take 100 mcg by mouth daily.   No facility-administered encounter medications on file as of 04/15/2023.     Lab Results  Component Value Date   WBC 4.9 11/03/2022   HGB 12.3 11/03/2022   HCT 37.9 11/03/2022   PLT 241.0 11/03/2022   GLUCOSE 78 02/09/2023   CHOL 160 02/09/2023   TRIG 83.0 02/09/2023   HDL 60.60 02/09/2023  LDLDIRECT 128.0 05/25/2013   LDLCALC 82 02/09/2023   ALT 16 02/09/2023   AST 19 02/09/2023   NA 142 02/09/2023   K 4.5 02/09/2023   CL 106 02/09/2023   CREATININE 0.72 02/09/2023   BUN 11 02/09/2023   CO2 28 02/09/2023   TSH 2.05 02/10/2023   HGBA1C 5.7 (H) 04/25/2017    MR BRAIN WO CONTRAST  Result Date: 03/01/2023 CLINICAL DATA:  Occipital head tightness. History of prior stroke with left arm weakness. Frequent falling. EXAM: MRI HEAD WITHOUT CONTRAST TECHNIQUE: Multiplanar, multiecho pulse sequences of the brain and surrounding structures were obtained without intravenous contrast. COMPARISON:  Head CT 06/11/2022.  MRI 04/25/2021. FINDINGS: Brain: Diffusion imaging does not show any acute or subacute infarction. No primary  abnormality affects the brainstem. Somewhat layering degeneration on the right. No focal cerebellar insult. Cerebral hemispheres show old infarction in the right basal ganglia and radiating white matter tracts. Elsewhere there are mild chronic small-vessel ischemic changes of the white matter. No large vessel territory stroke. No mass, hemorrhage, hydrocephalus or extra-axial collection. Minimal hemosiderin deposition in the old right-sided stroke. Vascular: Major vessels at the base of the brain show flow. Skull and upper cervical spine: Negative Sinuses/Orbits: Clear/normal Other: None IMPRESSION: No acute or reversible finding. Old infarction in the right basal ganglia and radiating white matter tracts. Mild chronic small-vessel ischemic changes elsewhere affecting the cerebral hemispheric white matter. Electronically Signed   By: Paulina Fusi M.D.   On: 03/01/2023 09:55       Assessment & Plan:  Redness of eye, right Assessment & Plan: Redness - surrounding eye.  No redness in the eye itself.  EOMI.  Do not feel the redness around the eye is related to the doxycycline.  No other rash.  No lip or tongue swelling.  No pain in the eye.  No redness - eye.  She has stopped doxycycline.  Bruise above eye as outlined. Unclear etiology.  Will have ophthalmology evaluated to confirm etiology.  Avoid rubbing.      Orders: -     Ambulatory referral to Ophthalmology  Essential hypertension Assessment & Plan: Continue amlodipine.  Blood pressure as outlined.  Follow pressures.  Follow metabolic panel.    Mild depression Assessment & Plan: Discussed increased stress.  Doing some better as outlined.  Follow.    Hypothyroidism, unspecified type Assessment & Plan: On thyroid replacement.  Follow tsh.    Hypercholesterolemia Assessment & Plan: On lipitor.  Follow lipid panel and liver function tests.     Gastroesophageal reflux disease, unspecified whether esophagitis present Assessment &  Plan: Continue protonix.  No acid reflux reported.  Follow.    Inflammatory arthritis Assessment & Plan: Followed by Dr Gavin Potters.  On MTX.    Leg lesion Assessment & Plan: Abrasion - s/p injury as outlined.  Was on doxycycline.  Do not feel the redness around the eye is related to the doxycycline.  No other rash.  No lip or tongue swelling.  No pain in the eye.  No redness - eye.  She has stopped doxycycline.  Will continue with bactroban topically. Follow.      Benign essential HTN Assessment & Plan: Blood pressures have been doing well.  Continue current medication regimen.  Follow pressures.  Follow metabolic panel.    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, involving unspecified site Assessment & Plan: On MTX.  Seeing rheumatology (Dr DeFoor).  Stable.    History of CVA (cerebrovascular accident) Assessment & Plan: Continue  plavix. Has been on lipitor.   Follow lipid panel.    Other orders -     Mupirocin; Apply 1 Application topically 2 (two) times daily.  Dispense: 22 g; Refill: 0     Dale [place] Grass, MD

## 2023-04-15 NOTE — Patient Instructions (Signed)
Stop doxycycline.

## 2023-04-16 DIAGNOSIS — L989 Disorder of the skin and subcutaneous tissue, unspecified: Secondary | ICD-10-CM | POA: Insufficient documentation

## 2023-04-16 NOTE — Assessment & Plan Note (Signed)
Discussed increased stress.  Doing some better as outlined.  Follow.

## 2023-04-16 NOTE — Assessment & Plan Note (Signed)
Continue plavix. Has been on lipitor.   Follow lipid panel.  

## 2023-04-16 NOTE — Assessment & Plan Note (Signed)
Redness - surrounding eye.  No redness in the eye itself.  EOMI.  Do not feel the redness around the eye is related to the doxycycline.  No other rash.  No lip or tongue swelling.  No pain in the eye.  No redness - eye.  She has stopped doxycycline.  Bruise above eye as outlined. Unclear etiology.  Will have ophthalmology evaluated to confirm etiology.  Avoid rubbing.

## 2023-04-16 NOTE — Assessment & Plan Note (Signed)
Blood pressures have been doing well.  Continue current medication regimen.  Follow pressures.  Follow metabolic panel.  

## 2023-04-16 NOTE — Assessment & Plan Note (Signed)
On thyroid replacement.  Follow tsh.  

## 2023-04-16 NOTE — Assessment & Plan Note (Signed)
Followed by Dr Kernodle.  On MTX.   

## 2023-04-16 NOTE — Assessment & Plan Note (Signed)
Continue protonix.  No acid reflux reported.  Follow.  

## 2023-04-16 NOTE — Assessment & Plan Note (Signed)
Abrasion - s/p injury as outlined.  Was on doxycycline.  Do not feel the redness around the eye is related to the doxycycline.  No other rash.  No lip or tongue swelling.  No pain in the eye.  No redness - eye.  She has stopped doxycycline.  Will continue with bactroban topically. Follow.

## 2023-04-16 NOTE — Assessment & Plan Note (Signed)
Continue amlodipine. Blood pressure as outlined.  Follow pressures.  Follow metabolic panel.  °

## 2023-04-16 NOTE — Assessment & Plan Note (Signed)
On MTX.  Seeing rheumatology (Dr DeFoor).  Stable.  

## 2023-04-16 NOTE — Assessment & Plan Note (Signed)
On lipitor.  Follow lipid panel and liver function tests.   

## 2023-04-22 ENCOUNTER — Telehealth: Payer: Self-pay | Admitting: Internal Medicine

## 2023-04-22 DIAGNOSIS — L239 Allergic contact dermatitis, unspecified cause: Secondary | ICD-10-CM | POA: Diagnosis not present

## 2023-04-22 DIAGNOSIS — H35372 Puckering of macula, left eye: Secondary | ICD-10-CM | POA: Diagnosis not present

## 2023-04-22 DIAGNOSIS — H1013 Acute atopic conjunctivitis, bilateral: Secondary | ICD-10-CM | POA: Diagnosis not present

## 2023-04-22 NOTE — Telephone Encounter (Signed)
Patient also wanted Dr Lorin Picket to know that she went to [facility], eyes are good.

## 2023-04-22 NOTE — Telephone Encounter (Signed)
Patient called and said her wound that Dr Lorin Picket looked at it on the 18th of April is still the same, no better, no worse. Still swollen around ankle. She is requesting a referral.

## 2023-04-22 NOTE — Telephone Encounter (Signed)
Update on her eye and leg. Saw you 4/18. Using bactroban for her leg. No change. Do you want her to be seen again or refer her out?

## 2023-04-23 NOTE — Telephone Encounter (Signed)
Pt scheduled to see you 04/28/23

## 2023-04-23 NOTE — Telephone Encounter (Signed)
The area (when she was here) was scabbed and there was no increased erythema, etc.  Had her use topical bacroban.  If not healing, it probably would be best to reevaluate and see what is needed.  Can see if needs referral or determine best treatment.  Glad eyes are better.

## 2023-04-28 ENCOUNTER — Ambulatory Visit (INDEPENDENT_AMBULATORY_CARE_PROVIDER_SITE_OTHER): Payer: PPO | Admitting: Internal Medicine

## 2023-04-28 ENCOUNTER — Encounter: Payer: Self-pay | Admitting: Internal Medicine

## 2023-04-28 VITALS — BP 122/70 | HR 75 | Temp 98.0°F | Resp 16 | Ht 64.0 in | Wt 103.4 lb

## 2023-04-28 DIAGNOSIS — E538 Deficiency of other specified B group vitamins: Secondary | ICD-10-CM

## 2023-04-28 DIAGNOSIS — F4323 Adjustment disorder with mixed anxiety and depressed mood: Secondary | ICD-10-CM

## 2023-04-28 DIAGNOSIS — M7989 Other specified soft tissue disorders: Secondary | ICD-10-CM | POA: Insufficient documentation

## 2023-04-28 DIAGNOSIS — E039 Hypothyroidism, unspecified: Secondary | ICD-10-CM

## 2023-04-28 DIAGNOSIS — K219 Gastro-esophageal reflux disease without esophagitis: Secondary | ICD-10-CM | POA: Diagnosis not present

## 2023-04-28 DIAGNOSIS — I10 Essential (primary) hypertension: Secondary | ICD-10-CM | POA: Diagnosis not present

## 2023-04-28 DIAGNOSIS — G8320 Monoplegia of upper limb affecting unspecified side: Secondary | ICD-10-CM

## 2023-04-28 DIAGNOSIS — F320 Major depressive disorder, single episode, mild: Secondary | ICD-10-CM

## 2023-04-28 DIAGNOSIS — Z8673 Personal history of transient ischemic attack (TIA), and cerebral infarction without residual deficits: Secondary | ICD-10-CM

## 2023-04-28 DIAGNOSIS — L989 Disorder of the skin and subcutaneous tissue, unspecified: Secondary | ICD-10-CM | POA: Diagnosis not present

## 2023-04-28 DIAGNOSIS — R7989 Other specified abnormal findings of blood chemistry: Secondary | ICD-10-CM

## 2023-04-28 DIAGNOSIS — M0600 Rheumatoid arthritis without rheumatoid factor, unspecified site: Secondary | ICD-10-CM

## 2023-04-28 DIAGNOSIS — E7800 Pure hypercholesterolemia, unspecified: Secondary | ICD-10-CM

## 2023-04-28 NOTE — Assessment & Plan Note (Signed)
Blood pressures have been doing well.  Continue amlodipine.  Follow pressures.  Follow metabolic panel.

## 2023-04-28 NOTE — Assessment & Plan Note (Signed)
Continue plavix. Has been on lipitor.   Follow lipid panel.  

## 2023-04-28 NOTE — Assessment & Plan Note (Signed)
Increased stress as outlined.  Have discussed.  Has declined any further intervention.  Follow.

## 2023-04-28 NOTE — Assessment & Plan Note (Addendum)
Persistent small wound - lower leg.  Injury occurred 03/31/23.  Persistent.  Previously treated with doxycycline.  Has been using bactroban.  Hold on topical ointment.  Some swelling as outlined.  Given persistence request referral to wound clinic for further evaluation and treatment.

## 2023-04-28 NOTE — Assessment & Plan Note (Signed)
Swelling of lower extremity. Discussed venous insufficiency.  Discussed leg elevation.  DP pulses palpable and equal bilaterally.  Request referral to Dr Gilda Crease.

## 2023-04-28 NOTE — Assessment & Plan Note (Addendum)
Check 12 level with next labs.

## 2023-04-28 NOTE — Progress Notes (Signed)
Subjective:    Patient ID: Donna Rhodes, female    DOB: 09/04/1946, 77 y.o.   MRN: [id number]  Patient here for  Chief Complaint  Patient presents with   Wound Check    Left leg    HPI Work in appt to reevaluate lower leg wound.  Was initially seen acute care after scraping her leg on a bush 03/31/23.  Was initially placed on doxycycline.  Last visit here, persistent small wound - placed on bactroban.  Has been using regularly.  Persistent - lower leg (scabbed) lesion.  Some swelling - pedal and ankle.  No swelling extending up leg. Discussed venostasis.  Feel may be contributing to the slow healing.  No chest pain reported.  Breathing stable.  No increased cough or congestion. Stable - sensation - head.  Seeing neurology.  Recent MRI reviewed and discussed.    Past Medical History:  Diagnosis Date   Actinic keratosis    Atrophic vaginitis    Dysplasia of vagina    vin 1 - vulva and vaginal cuff   GERD (gastroesophageal reflux disease)    HCV infection    History of basal cell carcinoma (BCC) 04/11/2019   right lat lower eyelid margin Mohs 2020 Dr. Adriana Simas   HSV (herpes simplex virus) infection    Hypercholesterolemia    Hypertension    Hypothyroidism    Left-sided weakness    S/P CVA 4/18.  PT 2x/wk.   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   Thyroid nodule    pt unaware of this    Vaginal Pap smear, abnormal    lgsil   Past Surgical History:  Procedure Laterality Date   ADENOIDECTOMY     APPENDECTOMY  1965   BUNIONECTOMY  1994   left   CATARACT EXTRACTION W/PHACO Right 10/27/2017   Procedure: CATARACT EXTRACTION PHACO AND INTRAOCULAR LENS PLACEMENT (IOC) RIGHT;  Surgeon: Lockie Mola, MD;  Location: [facility];  Service: Ophthalmology;  Laterality: Right;  requests early  (8-8:30 arrival)   CATARACT EXTRACTION W/PHACO Left 11/10/2017   Procedure: CATARACT EXTRACTION PHACO AND INTRAOCULAR LENS PLACEMENT (IOC) LEFT;  Surgeon: Lockie Mola, MD;  Location: [facility];  Service: Ophthalmology;  Laterality: Left;   CHOLECYSTECTOMY     FINGER SURGERY  1982   laser vein surgery     LOOP RECORDER INSERTION N/A 09/24/2017   Procedure: LOOP RECORDER INSERTION;  Surgeon: Marinus Maw, MD;  Location: [facility];  Service: Cardiovascular;  Laterality: N/A;   MANDIBLE RECONSTRUCTION  1992   TOENAIL EXCISION  1998   several   TONSILLECTOMY     TUBAL LIGATION  1977   VAGINAL HYSTERECTOMY  1983   als0- anterior/posterior colporrhaphy   VAGINAL WOUND CLOSURE / REPAIR  1999, 2000   VAGINAL WOUND CLOSURE / REPAIR     Family History  Problem Relation Age of Onset   Hypertension Father    Dementia Father    Osteoarthritis Mother    Arthritis/Rheumatoid Sister    Breast cancer Other        paternal side   Breast cancer Paternal Aunt    Breast cancer Cousin        maternal   Uterine cancer Maternal Grandmother    Ovarian cancer  Neg Hx    Heart disease Neg Hx    Diabetes Neg Hx    Colon cancer Neg Hx    Social History   Socioeconomic History   Marital status: Divorced    Spouse name: Not on file   Number of children: 3   Years of education: 12   Highest education level: Not on file  Occupational History    Comment: retired  Tobacco Use   Smoking status: Former    Types: Cigarettes    Quit date: 12/29/1979    Years since quitting: 43.3   Smokeless tobacco: Never  Vaping Use   Vaping Use: Never used  Substance and Sexual Activity   Alcohol use: No    Alcohol/week: 0.0 standard drinks of alcohol   Drug use: No   Sexual activity: Not Currently    Birth control/protection: Surgical  Other Topics Concern   Not on file  Social History Narrative   Her mother and her daughter live with her   Caffeine- 1 cup coffee   Social  Determinants of Health   Financial Resource Strain: Low Risk  (02/24/2021)   Overall Financial Resource Strain (CARDIA)    Difficulty of Paying Living Expenses: Not hard at all  Food Insecurity: No Food Insecurity (02/24/2021)   Hunger Vital Sign    Worried About Running Out of Food in the Last Year: Never true    Ran Out of Food in the Last Year: Never true  Transportation Needs: No Transportation Needs (02/24/2021)   PRAPARE - Transportation    Lack of Transportation (Medical): No    Lack of Transportation (Non-Medical): No  Physical Activity: Not on file  Stress: No Stress Concern Present (02/24/2021)   [employer] of Occupational Health - Occupational Stress Questionnaire    Feeling of Stress : Only a little  Social Connections: Unknown (02/24/2021)   Social Connection and Isolation Panel [NHANES]    Frequency of Communication with Friends and Family: More than three times a week    Frequency of Social Gatherings with Friends and Family: More than three times a week    Attends Religious Services: Not on Marketing executive or Organizations: Not on file    Attends Banker Meetings: Not on file    Marital Status: Not on file     Review of Systems  Constitutional:  Negative for appetite change and fever.  HENT:  Negative for congestion.   Respiratory:  Negative for cough, chest tightness and shortness of breath.   Cardiovascular:  Negative for chest pain and palpitations.  Gastrointestinal:  Negative for abdominal pain, diarrhea, nausea and vomiting.  Genitourinary:  Negative for difficulty urinating and dysuria.  Musculoskeletal:  Negative for joint swelling and myalgias.  Skin:  Negative for color change and rash.       Leg wound as outlined.   Neurological:        "Different sensation" - head. Unchanged.  Has seen neurology.   Psychiatric/Behavioral:  Negative for agitation and dysphoric mood.        Objective:     BP 122/70   Pulse 75    Temp 98 F (36.7 C)   Resp 16   Ht 5\' 4"  (1.626 m)   Wt 103 lb 6.4 oz (46.9 kg)   LMP 12/29/1981   SpO2 98%   BMI 17.75 kg/m  Wt Readings from Last 3 Encounters:  04/28/23 103 lb 6.4 oz (46.9 kg)  04/15/23 101 lb  8 oz (46 kg)  02/25/23 100 lb 9.6 oz (45.6 kg)    Physical Exam Vitals reviewed.  Constitutional:      General: She is not in acute distress.    Appearance: Normal appearance.  HENT:     Head: Normocephalic and atraumatic.     Right Ear: External ear normal.     Left Ear: External ear normal.  Eyes:     General: No scleral icterus.       Right eye: No discharge.        Left eye: No discharge.     Conjunctiva/sclera: Conjunctivae normal.  Neck:     Thyroid: No thyromegaly.  Cardiovascular:     Rate and Rhythm: Normal rate and regular rhythm.  Pulmonary:     Effort: No respiratory distress.     Breath sounds: Normal breath sounds. No wheezing.  Abdominal:     General: Bowel sounds are normal.     Palpations: Abdomen is soft.     Tenderness: There is no abdominal tenderness.  Musculoskeletal:        General: No swelling or tenderness.     Cervical back: Neck supple. No tenderness.  Lymphadenopathy:     Cervical: No cervical adenopathy.  Skin:    Findings: No rash.     Comments: Lesion/small wound - left lower leg.  No surrounding erythema. Some stasis changes.  Lower extremity swelling (ankle and pedal).    Neurological:     Mental Status: She is alert.  Psychiatric:        Mood and Affect: Mood normal.        Behavior: Behavior normal.      Outpatient Encounter Medications as of 04/28/2023  Medication Sig   acetaminophen (TYLENOL) 500 MG tablet Take 1-2 tablets (500-1,000 mg total) by mouth every 6 (six) hours as needed for mild pain.   acyclovir (ZOVIRAX) 200 MG capsule Take 1 capsule (200 mg total) by mouth 2 (two) times daily.   amLODipine (NORVASC) 2.5 MG tablet Take 1 tablet (2.5 mg total) by mouth daily.   atorvastatin (LIPITOR) 20 MG tablet  TAKE 1 TABLET BY MOUTH DAILY   calcium-vitamin D (OSCAL WITH D) 250-125 MG-UNIT tablet Take 1 tablet by mouth daily.   Cholecalciferol (VITAMIN D3 PO) Take by mouth.   clopidogrel (PLAVIX) 75 MG tablet Take 1 tablet (75 mg total) by mouth daily.   folic acid (FOLVITE) 1 MG tablet Take 1 mg by mouth daily.   ketoconazole (NIZORAL) 2 % shampoo Apply 1 Application topically 2 (two) times a week.   levothyroxine (SYNTHROID) 50 MCG tablet Take 1 tablet (50 mcg total) by mouth daily before breakfast.   MAGNESIUM PO Take by mouth.   methotrexate 2.5 MG tablet Take 10 mg by mouth once a week.   mupirocin ointment (BACTROBAN) 2 % Apply 1 Application topically 2 (two) times daily.   vitamin B-12 (CYANOCOBALAMIN) 100 MCG tablet Take 100 mcg by mouth daily.   No facility-administered encounter medications on file as of 04/28/2023.     Lab Results  Component Value Date   WBC 4.9 11/03/2022   HGB 12.3 11/03/2022   HCT 37.9 11/03/2022   PLT 241.0 11/03/2022   GLUCOSE 78 02/09/2023   CHOL 160 02/09/2023   TRIG 83.0 02/09/2023   HDL 60.60 02/09/2023   LDLDIRECT 128.0 05/25/2013   LDLCALC 82 02/09/2023   ALT 16 02/09/2023   AST 19 02/09/2023   NA 142 02/09/2023   K 4.5 02/09/2023  CL 106 02/09/2023   CREATININE 0.72 02/09/2023   BUN 11 02/09/2023   CO2 28 02/09/2023   TSH 2.05 02/10/2023   HGBA1C 5.7 (H) 04/25/2017    MR BRAIN WO CONTRAST  Result Date: 03/01/2023 CLINICAL DATA:  Occipital head tightness. History of prior stroke with left arm weakness. Frequent falling. EXAM: MRI HEAD WITHOUT CONTRAST TECHNIQUE: Multiplanar, multiecho pulse sequences of the brain and surrounding structures were obtained without intravenous contrast. COMPARISON:  Head CT 06/11/2022.  MRI 04/25/2021. FINDINGS: Brain: Diffusion imaging does not show any acute or subacute infarction. No primary abnormality affects the brainstem. Somewhat layering degeneration on the right. No focal cerebellar insult. Cerebral  hemispheres show old infarction in the right basal ganglia and radiating white matter tracts. Elsewhere there are mild chronic small-vessel ischemic changes of the white matter. No large vessel territory stroke. No mass, hemorrhage, hydrocephalus or extra-axial collection. Minimal hemosiderin deposition in the old right-sided stroke. Vascular: Major vessels at the base of the brain show flow. Skull and upper cervical spine: Negative Sinuses/Orbits: Clear/normal Other: None IMPRESSION: No acute or reversible finding. Old infarction in the right basal ganglia and radiating white matter tracts. Mild chronic small-vessel ischemic changes elsewhere affecting the cerebral hemispheric white matter. Electronically Signed   By: Paulina Fusi M.D.   On: 03/01/2023 09:55       Assessment & Plan:  Leg lesion Assessment & Plan: Persistent small wound - lower leg.  Injury occurred 03/31/23.  Persistent.  Previously treated with doxycycline.  Has been using bactroban.  Hold on topical ointment.  Some swelling as outlined.  Given persistence request referral to wound clinic for further evaluation and treatment.    Orders: -     Ambulatory referral to Wound Clinic  Abnormal liver function test Assessment & Plan: Previous ultrasound revealed fatty liver.  Recent liver panel wnl.     Adjustment disorder with mixed anxiety and depressed mood Assessment & Plan: Increased stress.  Have discussed.  Have discussed referral to therapist or psychiatry.  She has declined. Declines any further intervention.  Follow.    B12 deficiency Assessment & Plan: Check 12 level with next labs.    Benign essential HTN Assessment & Plan: Blood pressures have been doing well.  Continue amlodipine.  Follow pressures.  Follow metabolic panel.    Flaccid monoplegia of upper extremity (HCC) Assessment & Plan: Still some limitations.  Continue exercise.  Follow.    Gastroesophageal reflux disease, unspecified whether esophagitis  present Assessment & Plan: Continue protonix.  No acid reflux reported.  Follow.    History of CVA (cerebrovascular accident) Assessment & Plan: Continue plavix. Has been on lipitor.   Follow lipid panel.    Hypercholesterolemia Assessment & Plan: On lipitor.  Follow lipid panel and liver function tests.     Hypothyroidism, unspecified type Assessment & Plan: On thyroid replacement.  Follow tsh.    Major depressive disorder, single episode, mild (HCC) Assessment & Plan: Increased stress as outlined.  Have discussed.  Has declined any further intervention.  Follow.    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, involving unspecified site [facility]) Assessment & Plan: On MTX.  Seeing rheumatology (Dr DeFoor).  Stable.    Swelling of lower extremity Assessment & Plan: Swelling of lower extremity. Discussed venous insufficiency.  Discussed leg elevation.  DP pulses palpable and equal bilaterally.  Request referral to Dr Gilda Crease.    Orders: -     Ambulatory referral to Wound Clinic -     Ambulatory  referral to Vascular Surgery     Dale [place], MD

## 2023-04-28 NOTE — Assessment & Plan Note (Signed)
On MTX.  Seeing rheumatology (Dr DeFoor).  Stable.  

## 2023-04-28 NOTE — Assessment & Plan Note (Signed)
Previous ultrasound revealed fatty liver.  Recent liver panel wnl.   

## 2023-04-28 NOTE — Assessment & Plan Note (Signed)
Still some limitations.  Continue exercise.  Follow.  

## 2023-04-28 NOTE — Assessment & Plan Note (Signed)
On lipitor.  Follow lipid panel and liver function tests.   

## 2023-04-28 NOTE — Assessment & Plan Note (Signed)
Continue protonix.  No acid reflux reported.  Follow.  

## 2023-04-28 NOTE — Assessment & Plan Note (Addendum)
Increased stress.  Have discussed.  Have discussed referral to therapist or psychiatry.  She has declined. Declines any further intervention.  Follow.

## 2023-04-28 NOTE — Assessment & Plan Note (Signed)
On thyroid replacement.  Follow tsh.  

## 2023-05-04 ENCOUNTER — Encounter: Payer: PPO | Attending: Physician Assistant | Admitting: Physician Assistant

## 2023-05-04 DIAGNOSIS — Z87891 Personal history of nicotine dependence: Secondary | ICD-10-CM | POA: Insufficient documentation

## 2023-05-04 DIAGNOSIS — Z8673 Personal history of transient ischemic attack (TIA), and cerebral infarction without residual deficits: Secondary | ICD-10-CM | POA: Diagnosis not present

## 2023-05-04 DIAGNOSIS — S81812A Laceration without foreign body, left lower leg, initial encounter: Secondary | ICD-10-CM | POA: Insufficient documentation

## 2023-05-04 DIAGNOSIS — Z7901 Long term (current) use of anticoagulants: Secondary | ICD-10-CM | POA: Insufficient documentation

## 2023-05-04 DIAGNOSIS — S81802A Unspecified open wound, left lower leg, initial encounter: Secondary | ICD-10-CM | POA: Diagnosis not present

## 2023-05-04 DIAGNOSIS — X58XXXA Exposure to other specified factors, initial encounter: Secondary | ICD-10-CM | POA: Insufficient documentation

## 2023-05-04 DIAGNOSIS — L97822 Non-pressure chronic ulcer of other part of left lower leg with fat layer exposed: Secondary | ICD-10-CM | POA: Diagnosis not present

## 2023-05-04 DIAGNOSIS — I10 Essential (primary) hypertension: Secondary | ICD-10-CM | POA: Insufficient documentation

## 2023-05-05 NOTE — Progress Notes (Signed)
Donna Rhodes, Donna Rhodes ([id number]) [phone] [id number] Page 1 of 4 Visit Report for 05/04/2023 Abuse Risk Screen Details Patient Name: Date of Service: [facility] Donna Rhodes 05/04/2023 9:30 A M Medical Record Number: [id number] Patient Account Number: [id number] Date of Birth/Sex: Treating RN: Jan 13, 1946 (77 y.o. Freddy Finner Primary Care Alphonzo Devera: Dale [place] Other Clinician: Referring Jaliyah Fotheringham: Treating Dietra Stokely/Extender: Antony Blackbird Weeks in Treatment: 0 Abuse Risk Screen Items Answer ABUSE RISK SCREEN: Has anyone close to you tried to hurt or harm you recentlyo No Do you feel uncomfortable with anyone in your familyo No Has anyone forced you do things that you didnt want to doo No Electronic Signature(Rhodes) Signed: 05/05/2023 9:53:20 AM By: Yevonne Pax RN Entered By: Yevonne Pax on 05/04/2023 09:45:04 -------------------------------------------------------------------------------- Activities of Daily Living Details Patient Name: Date of Service: [facility] Donna Rhodes 05/04/2023 9:30 A M Medical Record Number: [id number] Patient Account Number: [id number] Date of Birth/Sex: Treating RN: 05-02-1946 (77 y.o. Freddy Finner Primary Care Rinaldo Macqueen: Dale [place] Other Clinician: Referring Keirsten Matuska: Treating Rossi Burdo/Extender: Antony Blackbird Weeks in Treatment: 0 Activities of Daily Living Items Answer Activities of Daily Living (Please select one for each item) Drive Automobile Completely Able T Medications ake Completely Able Use T elephone Completely Able Care for Appearance Completely Able Use T oilet Completely Able Bath / Shower Completely Able Dress Self Completely Able Feed Self Completely Able Walk Completely Able Get In / Out Bed Completely Able Housework Completely Able Prepare Meals Completely Able Handle Money Completely Able Shop for Self Completely Able Electronic Signature(Rhodes) Signed: 05/05/2023 9:53:20 AM  By: Yevonne Pax RN Entered By: Yevonne Pax on 05/04/2023 09:45:24 -------------------------------------------------------------------------------- Education Screening Details Patient Name: Date of Service: Donna Nab. 05/04/2023 9:30 A M Medical Record Number: [id number] Patient Account Number: [id number] Date of Birth/Sex: Treating RN: August 03, 1946 (77 y.o. Freddy Finner Primary Care Mekia Dipinto: Dale [place] Other Clinician: Referring Avalon Coppinger: Treating Gio Janoski/Extender: Antony Blackbird Weeks in Treatment: [address] Leighton Parody ([id number]) [id number] Page 2 of 4 Primary Learner Assessed: Patient Learning Preferences/Education Level/Primary Language Learning Preference: Explanation Highest Education Level: High School Preferred Language: English Cognitive Barrier Language Barrier: No Translator Needed: No Memory Deficit: No Emotional Barrier: No Cultural/Religious Beliefs Affecting Medical Care: No Physical Barrier Impaired Vision: Yes Glasses Impaired Hearing: No Decreased Hand dexterity: No Knowledge/Comprehension Knowledge Level: Medium Comprehension Level: Medium Ability to understand written instructions: Medium Ability to understand verbal instructions: Medium Motivation Anxiety Level: Anxious Cooperation: Cooperative Education Importance: Acknowledges Need Interest in Health Problems: Asks Questions Perception: Coherent Willingness to Engage in Self-Management High Activities: Readiness to Engage in Self-Management High Activities: Electronic Signature(Rhodes) Signed: 05/05/2023 9:53:20 AM By: Yevonne Pax RN Entered By: Yevonne Pax on 05/04/2023 09:46:11 -------------------------------------------------------------------------------- Fall Risk Assessment Details Patient Name: Date of Service: [facility] Donna Rhodes, Donna Hay Rhodes. 05/04/2023 9:30 A M Medical Record Number: [id number] Patient Account Number: [id number] Date of  Birth/Sex: Treating RN: [birth year] (77 y.o. Freddy Finner Primary Care Darrion Macaulay: Dale [place] Other Clinician: Referring Eniola Cerullo: Treating Eleonore Shippee/Extender: Antony Blackbird Weeks in Treatment: 0 Fall Risk Assessment Items Have you had 2 or more falls in the last 12 monthso 0 No Have you had any fall that resulted in injury in the last 12 monthso 0 No FALLS RISK SCREEN History of falling - immediate or within 3 months 0 No Secondary diagnosis (Do you have 2 or more medical diagnoseso) 0 No Ambulatory aid None/bed rest/wheelchair/nurse 0 Yes Crutches/cane/walker 0 No Furniture  0 No Intravenous therapy Access/Saline/Heparin Lock 0 No Gait/Transferring Normal/ bed rest/ wheelchair 0 Yes Weak (short steps with or without shuffle, stooped but able to lift head while walking, may seek 0 No support from furniture) Impaired (short steps with shuffle, may have difficulty arising from chair, head down, impaired 0 No balance) Mental Status Oriented to own ability 0 Yes Overestimates or forgets limitations 0 No Risk Level: Low Risk Score: 0 Donna Rhodes, Donna Rhodes ([id number]) [phone] [id number] Page 3 of 4 Electronic Signature(Rhodes) -------------------------------------------------------------------------------- Foot Assessment Details Patient Name: Date of Service: [facility] Donna Rhodes 05/04/2023 9:30 A M Medical Record Number: [id number] Patient Account Number: [id number] Date of Birth/Sex: Treating RN: 04/25/1946 (77 y.o. Freddy Finner Primary Care Elin Fenley: Dale [place] Other Clinician: Referring Linde Wilensky: Treating Jemiah [place]/Extender: Antony Blackbird Weeks in Treatment: 0 Foot Assessment Items Site Locations + = Sensation present, - = Sensation absent, C = Callus, U = Ulcer R = Redness, W = Warmth, M = Maceration, PU = Pre-ulcerative lesion F = Fissure, Rhodes = Swelling, D = Dryness Assessment Right: Left: Other Deformity: No  No Prior Foot Ulcer: No No Prior Amputation: No No Charcot Joint: No No Ambulatory Status: Ambulatory Without Help Gait: Steady Electronic Signature(Rhodes) Signed: 05/05/2023 9:53:20 AM By: Yevonne Pax RN Entered By: Yevonne Pax on 05/04/2023 09:53:15 -------------------------------------------------------------------------------- Nutrition Risk Screening Details Patient Name: Date of Service: [facility] Donna Rhodes 05/04/2023 9:30 A M Medical Record Number: [id number] Patient Account Number: [id number] Date of Birth/Sex: Treating RN: [birth year] (77 y.o. Freddy Finner Primary Care Halo Laski: Dale [place] Other Clinician: Referring Brodin Gelpi: Treating Jillien Yakel/Extender: Antony Blackbird Weeks in Treatment: 0 Height (in): 64 Weight (lbs): 102 Body Mass Index (BMI): 17.5 Donna Rhodes, Donna Rhodes ([id number]) [id number] Page 4 of 4 Nutrition Risk Screening Items Score Screening NUTRITION RISK SCREEN: I have an illness or condition that made me change the kind and/or amount of food I eat 0 No I eat fewer than two meals per day 0 No I eat few fruits and vegetables, or milk products 0 No I have three or more drinks of beer, liquor or wine almost every day 0 No I have tooth or mouth problems that make it hard for me to eat 0 No I don't always have enough money to buy the food I need 0 No I eat alone most of the time 0 No I take three or more different prescribed or over-the-counter drugs a day 1 Yes Without wanting to, I have lost or gained 10 pounds in the last six months 0 No I am not always physically able to shop, cook and/or feed myself 0 No Nutrition Protocols Good Risk Protocol 0 No interventions needed Moderate Risk Protocol High Risk Proctocol Risk Level: Good Risk Score: 1 Electronic Signature(Rhodes) Signed: 05/05/2023 9:53:20 AM By: Yevonne Pax RN Entered By: Yevonne Pax on 05/04/2023 09:46:41

## 2023-05-05 NOTE — Progress Notes (Signed)
Donna Rhodes, Donna Rhodes ([id number]) [id number] Page 1 of 6 Visit Report for 05/04/2023 Chief Complaint Document Details Patient Name: Date of Service: [facility] [name] 05/04/2023 9:30 A M Medical Record Number: [id number] Patient Account Number: [id number] Date of Birth/Sex: Treating RN: 03-02-1946 (77 y.o. Freddy Finner Primary Care Provider: Dale [place] Other Clinician: Referring Provider: Treating Provider/Extender: Antony Blackbird Weeks in Treatment: 0 Information Obtained from: Patient Chief Complaint Left LE Ulcer Electronic Signature(s) Signed: 05/04/2023 10:20:44 AM By: Allen Derry PA-C Entered By: Allen Derry on 05/04/2023 10:20:44 -------------------------------------------------------------------------------- HPI Details Patient Name: Date of Service: [facility] Rhodes, Donna Pummel. 05/04/2023 9:30 A M Medical Record Number: [id number] Patient Account Number: [id number] Date of Birth/Sex: Treating RN: 1946/02/12 (77 y.o. Freddy Finner Primary Care Provider: Dale [place] Other Clinician: Referring Provider: Treating Provider/Extender: Antony Blackbird Weeks in Treatment: 0 History of Present Illness HPI Description: 05-04-2023 upon evaluation today patient presents for initial inspection here in our clinic concerning an area where she has a tiny scab on the right leg which she tells me has been present for about 3 weeks. Fortunately there does not appear to be any pain, drainage, or signs of opening or active infection at this time. In fact my guess is that this is probably completely healed underneath. Her biggest complaint is that of having tightness on the inner side of her ankle area and we did have a discussion about that today I think this is more swelling secondary to the inflammation from the injury that occurred when she got the skin tear on her leg from the [place]. Patient does have a history of hypertension, a history of  stroke with this continuing to affect her left side, and long-term use of anticoagulant therapy. This is secondary to the history of stroke. Electronic Signature(s) Signed: 05/04/2023 5:32:15 PM By: Allen Derry PA-C Entered By: Allen Derry on 05/04/2023 17:32:15 -------------------------------------------------------------------------------- Physical Exam Details Patient Name: Date of Service: [facility] [name] 05/04/2023 9:30 A M Medical Record Number: [id number] Patient Account Number: [id number] Date of Birth/Sex: Treating RN: 1946/04/22 (77 y.o. Freddy Finner Primary Care Provider: Dale [place] Other Clinician: Referring Provider: Treating Provider/Extender: Antony Blackbird Weeks in Treatment: 0 Constitutional patient is hypertensive.. pulse regular and within target range for patient.Marland Kitchen respirations regular, non-labored and within target range for patient.Marland Kitchen temperature within target range for patient.. Well-nourished and well-hydrated in no acute distress. Eyes conjunctiva clear no eyelid edema noted. pupils equal round and reactive to light and accommodation. Ears, Nose, Mouth, and Throat no gross abnormality of ear auricles or external auditory canals. normal hearing noted during conversation. mucus membranes moist. Donna Rhodes, Donna Rhodes ([id number]) [id number] Page 2 of 6 Respiratory normal breathing without difficulty. Cardiovascular 2+ dorsalis pedis/posterior tibialis pulses. no clubbing, cyanosis, significant edema, <3 sec cap refill. Psychiatric this patient is able to make decisions and demonstrates good insight into disease process. Alert and Oriented x 3. pleasant and cooperative. Notes Upon inspection patient's wound bed actually showed signs of good granulation epithelization at this point. Fortunately I do not see any evidence of active infection nor anything even really open. I suspect that the area underneath this region of  scab is actually completely healed. With that being said I did discuss with the patient that her swelling in the leg I think is subsequent to the original injury and should resolve nonetheless I think really a compression sock or even in this case Tubigrip would probably be the best  way to go. She voiced understanding but notes that she is not able to get a compression sock on due to her weakness which I understand. We did try Tubigrip which I think is not quite as tight and may be easier for her. She does have left-sided weakness secondary to the history of stroke. Electronic Signature(s) Signed: 05/04/2023 5:33:05 PM By: Allen Derry PA-C Entered By: Allen Derry on 05/04/2023 17:33:05 -------------------------------------------------------------------------------- Physician Orders Details Patient Name: Date of Service: [facility] 05/04/2023 9:30 A M Medical Record Number: [id number] Patient Account Number: [id number] Date of Birth/Sex: Treating RN: 01-31-1946 (77 y.o. Freddy Finner Primary Care Provider: Dale [place] Other Clinician: Referring Provider: Treating Provider/Extender: Antony Blackbird Weeks in Treatment: 0 Verbal / Phone Orders: No Diagnosis Coding ICD-10 Coding Code Description [phone] Laceration without foreign body, left lower leg, initial encounter L97.822 Non-pressure chronic ulcer of other part of left lower leg with fat layer exposed I10 Essential (primary) hypertension Z86.73 Personal history of transient ischemic attack (TIA), and cerebral infarction without residual deficits Z79.01 Long term (current) use of anticoagulants Discharge From [facility] Services Consult Only - apply tubi grip size D single layer until patient obtains stockings Wear compression garments daily. Put garments on first thing when you wake up and remove them before bed. - apply tubi grip size D single layer until patient obtains stockings , elevate lower legs as much as  possible Electronic Signature(s) Signed: 05/04/2023 11:25:28 AM By: Yevonne Pax RN Signed: 05/04/2023 5:36:28 PM By: Allen Derry PA-C Entered By: Yevonne Pax on 05/04/2023 11:25:28 -------------------------------------------------------------------------------- Problem List Details Patient Name: Date of Service: [facility]. 05/04/2023 9:30 A M Medical Record Number: [id number] Patient Account Number: [id number] Date of Birth/Sex: Treating RN: 22-Sep-1946 (77 y.o. 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 05/04/2023 9:30 A M Medical Record Number: [id number] Patient Account Number: [id number] Date of Birth/Sex: Treating RN: 1946/08/30 (77 y.o. Freddy Finner Primary Care Provider: Dale [place] Other Clinician: Referring  Provider: Treating Provider/Extender: Antony Blackbird Weeks in Treatment: 0 Subjective Chief Complaint Information obtained from Patient Left LE Ulcer History of Present Illness (HPI) 05-04-2023 upon evaluation today patient presents for initial inspection here in our clinic concerning an area where she has a tiny scab on the right leg which she tells me has been present for about 3 weeks. Fortunately there does not appear to be any pain, drainage, or signs of opening or active infection at this time. In fact my guess is that this is probably completely healed underneath. Her biggest complaint is that of having tightness on the  inner side of her ankle area and we did have a discussion about that today I think this is more swelling secondary to the inflammation from the injury that occurred when she got the skin tear on her leg from [place]. Patient does have a history of hypertension, a history of stroke with this continuing to affect her left side, and long-term use of anticoagulant therapy. This is secondary to the history of stroke. Patient History Allergies amoxicillin, azithromycin, moxifloxacin, moxifloxacin HCl, Naprosyn, NSAIDS (Non-Steroidal Anti-Inflammatory Drug), Avelox, bacitracin, codeine, fish oil, walnut, naproxen Social History Former smoker, Marital Status - Divorced, Alcohol Use - Never, Drug Use - No History, Caffeine Use - Daily. Medical History Cardiovascular Patient has history of Hypertension Review of Systems (ROS) Eyes Complains or has symptoms of Glasses / Contacts. Donna Rhodes, Donna Rhodes ([id number]) [id number] Page 4 of 6 Objective Constitutional patient is hypertensive.. pulse regular and within target range for patient.Marland Kitchen respirations regular, non-labored and within target range for patient.Marland Kitchen temperature within target range for patient.. Well-nourished and well-hydrated in no acute distress. Vitals Time Taken: 9:39 AM,  Height: 64 in, Source: Stated, Weight: 102 lbs, Source: Stated, BMI: 17.5, Temperature: 97.8 F, Pulse: 69 bpm, Respiratory Rate: 18 breaths/min, Blood Pressure: 159/99 mmHg. Eyes conjunctiva clear no eyelid edema noted. pupils equal round and reactive to light and accommodation. Ears, Nose, Mouth, and Throat no gross abnormality of ear auricles or external auditory canals. normal hearing noted during conversation. mucus membranes moist. Respiratory normal breathing without difficulty. Cardiovascular 2+ dorsalis pedis/posterior tibialis pulses. no clubbing, cyanosis, significant edema, Psychiatric this patient is able to make decisions and demonstrates good insight into disease process. Alert and Oriented x 3. pleasant and cooperative. General Notes: Upon inspection patient's wound bed actually showed signs of good granulation epithelization at this point. Fortunately I do not see any evidence of active infection nor anything even really open. I suspect that the area underneath this region of scab is actually completely healed. With that being said I did discuss with the patient that her swelling in the leg I think is subsequent to the original injury and should resolve nonetheless I think really a compression sock or even in this case Tubigrip would probably be the best way to go. She voiced understanding but notes that she is not able to get a compression sock on due to her weakness which I understand. We did try Tubigrip which I think is not quite as tight and may be easier for her. She does have left-sided weakness secondary to the history of stroke. 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 Put garments on first thing when you wake up and remove them before bed. - apply tubi grip size D single layer until patient obtains stockings , elevate lower legs as much as possible 1. I would recommend that we have the patient continue to monitor the area in question if anything changes she should contact the office let me know. 2. I am going to suggest based on what we are seeing right now Tubigrip would probably be the best way to go forward. 3. And also can recommend that if this  area in question should open up and cause her more significant issues she should let me know soon as possible but I really do not expect that to become a major complicating issue for her to be perfectly honest. We will see patient back for reevaluation in 1 week here in the clinic. If anything worsens or changes patient will contact our office for additional recommendations. Electronic Signature(s) Signed: 05/04/2023 5:34:17 PM By: Allen Derry PA-C Entered By: Allen Derry on 05/04/2023 17:34:17 -------------------------------------------------------------------------------- ROS/PFSH Details Patient Name: Date of Service: [facility] Rhodes, Donna Pummel. 05/04/2023 9:30 A M Medical Record Number: [id number] Patient Account Number: [id number] Donna Rhodes, Donna Rhodes ([id number]) [id number] Page 5 of 6 Date of Birth/Sex: Treating RN: 07/27/1946 (77 y.o. Freddy Finner Primary Care Provider: Dale [place] Other Clinician: Referring Provider: Treating Provider/Extender: Antony Blackbird Weeks in Treatment: 0 Eyes Complaints and Symptoms: Positive for: Glasses / Contacts Cardiovascular Medical History: Positive for: Hypertension Immunizations Pneumococcal Vaccine: Received Pneumococcal Vaccination: Yes Received Pneumococcal  Vaccination On or After 60th Birthday: Yes Implantable Devices None Family and Social History Former smoker; Marital Status - Divorced; Alcohol Use: Never; Drug Use: No History; Caffeine Use: Daily Electronic Signature(s) Signed: 05/04/2023 5:36:28 PM By: Allen Derry PA-C Signed: 05/05/2023 9:53:20 AM By: Yevonne Pax RN Entered By: Yevonne Pax on 05/04/2023 09:44:57 -------------------------------------------------------------------------------- SuperBill Details Patient Name: Date of Service: [facility] Nab 05/04/2023 Medical Record Number: [id number] Patient Account Number: [id number] Date of Birth/Sex: Treating RN: 01-17-1946 (77 y.o. 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 residual deficits Z79.01 Long term (current) use of anticoagulants Facility Procedures : CPT4 Code: [id number] Description: 99213 - WOUND CARE VISIT-LEV 3 EST PT Modifier: Quantity: 1 Physician Procedures : CPT4 Code Description Modifier [id number] 99213 - WC PHYS LEVEL 3 - EST PT ICD-10 Diagnosis Description S81.812A Laceration without foreign body, left lower leg, initial encounter L97.822 Non-pressure chronic ulcer of other part of left lower leg with fat  layer exposed I10 Essential (primary) hypertension Z86.73 Personal history of transient ischemic attack (TIA), and cerebral infarction without residual deficits Quantity: [address] AVANELL, CAJINA ([id number]) [id number] Page 6 of 6 Signed:  05/04/2023 5:35:54 PM By: Allen Derry PA-C Previous Signature: 05/04/2023 11:26:12 AM Version By: Yevonne Pax RN Entered By: Allen Derry on 05/04/2023 17:35:54

## 2023-05-05 NOTE — Progress Notes (Signed)
Donna Rhodes ([id number]) [id number] Page 1 of 7 Visit Report for 05/04/2023 Allergy List Details Patient Name: Date of Service: [facility] Donna Rhodes 05/04/2023 9:30 A M Medical Record Number: [id number] Patient Account Number: [id number] Date of Birth/Sex: Treating RN: 02-Apr-1946 (77 y.o. Donna Rhodes Primary Care Iyana Topor: Dale [place] Other Clinician: Referring Heide Brossart: Treating Keari Miu/Extender: Antony Blackbird Weeks in Treatment: 0 Allergies Active Allergies amoxicillin azithromycin moxifloxacin moxifloxacin HCl Naprosyn NSAIDS (Non-Steroidal Anti-Inflammatory Drug) Avelox bacitracin codeine fish oil walnut naproxen Allergy Notes Electronic Signature(s) Signed: 05/05/2023 9:53:20 AM By: Yevonne Pax RN Entered By: Yevonne Pax on 05/04/2023 09:43:37 -------------------------------------------------------------------------------- Arrival Information Details Patient Name: Date of Service: Donna Nab. 05/04/2023 9:30 A M Medical Record Number: [id number] Patient Account Number: [id number] Date of Birth/Sex: Treating RN: 04/19/1946 (77 y.o. Donna Rhodes Primary Care Danielle Mink: Dale [place] Other Clinician: Referring Yen Wandell: Treating Kaoir Loree/Extender: Antony Blackbird Weeks in Treatment: 0 Visit Information Patient Arrived: Ambulatory Arrival Time: 08:00 Accompanied By: self Transfer Assistance: None Patient Identification Verified: Yes Secondary Verification Process Completed: Yes Patient Requires Transmission-Based Precautions: No Patient Has Alerts: Yes Patient Alerts: Patient on Blood Thinner Electronic Signature(s) Signed: 05/05/2023 9:53:20 AM By: Yevonne Pax RN Cherie Dark, Leighton Parody ([id number]) [id number] Page 2 of 7 Entered By: Yevonne Pax on 05/04/2023 09:38:48 -------------------------------------------------------------------------------- Clinic Level of  Care Assessment Details Patient Name: Date of Service: [facility] Donna Rhodes 05/04/2023 9:30 A M Medical Record Number: [id number] Patient Account Number: [id number] Date of Birth/Sex: Treating RN: 1946-07-21 (77 y.o. Donna Rhodes Primary Care Higinio Grow: Dale [place] Other Clinician: Referring Chanese Hartsough: Treating Lawrie Tunks/Extender: Antony Blackbird Weeks in Treatment: 0 Clinic Level of Care Assessment Items TOOL 2 Quantity Score X- 1 0 Use when only an EandM is performed on the INITIAL visit ASSESSMENTS - Nursing Assessment / Reassessment X- 1 20 General Physical Exam (combine w/ comprehensive assessment (listed just below) when performed on new pt. evals) X- 1 25 Comprehensive Assessment (HX, ROS, Risk Assessments, Wounds Hx, etc.) ASSESSMENTS - Wound and Skin A ssessment / Reassessment []  - 0 Simple Wound Assessment / Reassessment - one wound []  - 0 Complex Wound Assessment / Reassessment - multiple wounds []  - 0 Dermatologic / Skin Assessment (not related to wound area) ASSESSMENTS - Ostomy and/or Continence Assessment and Care []  - 0 Incontinence Assessment and Management []  - 0 Ostomy Care Assessment and Management (repouching, etc.) PROCESS - Coordination of Care X - Simple Patient / Family Education for ongoing care 1 15 []  - 0 Complex (extensive) Patient / Family Education for ongoing care []  - 0 Staff obtains Chiropractor, Records, T Results / Process Orders est []  - 0 Staff telephones HHA, Nursing Homes / Clarify orders / etc []  - 0 Routine Transfer to another Facility (non-emergent condition) []  - 0 Routine Hospital Admission (non-emergent condition) X- 1 15 New Admissions / Manufacturing engineer / Ordering NPWT Apligraf, etc. , []  - 0 Emergency Hospital Admission (emergent condition) []  - 0 Simple Discharge Coordination []  - 0 Complex (extensive) Discharge Coordination PROCESS - Special Needs []  - 0 Pediatric / Minor Patient Management []   - 0 Isolation Patient Management []  - 0 Hearing / Language / Visual special needs []  - 0 Assessment of Community assistance (transportation, D/C planning, etc.) []  - 0 Additional assistance / Altered mentation []  - 0 Support Surface(s) Assessment (bed, cushion, seat, etc.) INTERVENTIONS - Wound Cleansing / Measurement []  - 0 Wound Imaging (photographs - any number of wounds) []  -  0 Wound Tracing (instead of photographs) []  - 0 Simple Wound Measurement - one wound []  - 0 Complex Wound Measurement - multiple wounds []  - 0 Simple Wound Cleansing - one wound YAEL, SHERMER ([id number]) [id number] Page 3 of 7 []  - 0 Complex Wound Cleansing - multiple wounds INTERVENTIONS - Wound Dressings []  - 0 Small Wound Dressing one or multiple wounds []  - 0 Medium Wound Dressing one or multiple wounds []  - 0 Large Wound Dressing one or multiple wounds []  - 0 Application of Medications - injection INTERVENTIONS - Miscellaneous []  - 0 External ear exam []  - 0 Specimen Collection (cultures, biopsies, blood, body fluids, etc.) []  - 0 Specimen(s) / Culture(s) sent or taken to Lab for analysis []  - 0 Patient Transfer (multiple staff / Michiel Sites Lift / Similar devices) []  - 0 Simple Staple / Suture removal (25 or less) []  - 0 Complex Staple / Suture removal (26 or more) []  - 0 Hypo / Hyperglycemic Management (close monitor of Blood Glucose) X- 1 15 Ankle / Brachial Index (ABI) - do not check if billed separately Has the patient been seen at the hospital within the last three years: Yes Total Score: 90 Level Of Care: New/Established - Level 3 Electronic Signature(s) Signed: 05/05/2023 9:53:20 AM By: Yevonne Pax RN Entered By: Yevonne Pax on 05/04/2023 11:26:06 -------------------------------------------------------------------------------- Encounter Discharge Information Details Patient Name: Date of Service: Donna Nab. 05/04/2023 9:30 A M Medical  Record Number: [id number] Patient Account Number: [id number] Date of Birth/Sex: Treating RN: 07-01-1946 (77 y.o. Donna Rhodes Primary Care Donna Rhodes: Dale Phelan Other Clinician: Referring Chane Magner: Treating Faline Langer/Extender: Antony Blackbird Weeks in Treatment: 0 Encounter Discharge Information Items Discharge Condition: Stable Ambulatory Status: Ambulatory Discharge Destination: Home Transportation: Private Auto Accompanied By: self Schedule Follow-up Appointment: Yes Clinical Summary of Care: Electronic Signature(s) Signed: 05/04/2023 11:26:46 AM By: Yevonne Pax RN Entered By: Yevonne Pax on 05/04/2023 11:26:46 -------------------------------------------------------------------------------- General Visit Notes Details Patient Name: Date of Service: Donna Fisher S. 05/04/2023 9:30 A M Medical Record Number: [id number] Patient Account Number: [id number] Date of Birth/Sex: Treating RN: 10/15/1946 (77 y.o. Donna Rhodes Primary Care Jaqwon Manfred: Dale [place] Other Clinician: Referring Jyra Lagares: Treating Tangala Wiegert/Extender: Antony Blackbird Weeks in Treatment: 0 Notes scabbed area to left medial lower leg noted measures .8x.6 peri wound without redness, denies pain, no drainage CARLYNN, KNOPE ([id number]) [id number] Page 4 of 7 Electronic Signature(s) Signed: 05/04/2023 11:23:50 AM By: Yevonne Pax RN Entered By: Yevonne Pax on 05/04/2023 11:23:50 -------------------------------------------------------------------------------- Lower Extremity Assessment Details Patient Name: Date of Service: [facility] Donna Rhodes 05/04/2023 9:30 A M Medical Record Number: [id number] Patient Account Number: [id number] Date of Birth/Sex: Treating RN: 11-18-1946 (77 y.o. Donna Rhodes Primary Care Tearsa Kowalewski: Dale [place] Other Clinician: Referring Kelie Gainey: Treating Reade Trefz/Extender: Antony Blackbird Weeks in Treatment:  0 Edema Assessment Assessed: [Left: No] [Right: No] Edema: [Left: N] [Right: o] Calf Left: Right: Point of Measurement: 32 cm From Medial Instep 30 cm Ankle Left: Right: Point of Measurement: 10 cm From Medial Instep 19 cm Knee To Floor Left: Right: From Medial Instep 42 cm Vascular Assessment Pulses: Dorsalis Pedis Palpable: [Left:Yes] Doppler Audible: [Left:Yes] Blood Pressure: Brachial: [Left:159] Ankle: [Left:Dorsalis Pedis: 140 0.88] Electronic Signature(s) Signed: 05/05/2023 9:53:20 AM By: Yevonne Pax RN Entered By: Yevonne Pax on 05/04/2023 09:55:17 -------------------------------------------------------------------------------- Multi Wound Chart Details Patient Name: Date of Service: Donna Nab. 05/04/2023 9:30 A M Medical Record Number: [id number] Patient Account  Number: [id number] Date of Birth/Sex: Treating RN: 1946-10-25 (77 y.o. Donna Rhodes Primary Care Sadia Belfiore: Dale [place] Other Clinician: Referring Rod Majerus: Treating Shakeem Stern/Extender: Antony Blackbird Weeks in Treatment: 0 Vital Signs Height(in): 64 Pulse(bpm): 69 Weight(lbs): 102 Blood Pressure(mmHg): 159/99 Body Mass Index(BMI): 17.5 Temperature(F): 97.8 Respiratory Rate(breaths/min): 18 [1:Photos:] [N/A:N/A] Left, Medial Lower Leg N/A N/A Wound Location: Trauma N/A N/A Wounding Event: Trauma, Other N/A N/A Primary Etiology: Hypertension N/A N/A Comorbid History: 03/31/2023 N/A N/A Date Acquired: 0 N/A N/A Weeks of Treatment: Open N/A N/A Wound Status: No N/A N/A Wound Recurrence: 0.8x0.5x0.1 N/A N/A Measurements L x W x D (cm) 0.314 N/A N/A A (cm) : rea 0.031 N/A N/A Volume (cm) : Partial Thickness N/A N/A Classification: None Present N/A N/A Exudate A mount: None Present (0%) N/A N/A Granulation A mount: Large (67-100%) N/A N/A Necrotic A mount: Eschar N/A N/A Necrotic Tissue: Fascia: No N/A N/A Exposed Structures: Fat Layer (Subcutaneous  Tissue): No Tendon: No Muscle: No Joint: No Bone: No None N/A N/A Epithelialization: Treatment Notes Electronic Signature(s) Signed: 05/05/2023 9:53:20 AM By: Yevonne Pax RN Entered By: Yevonne Pax on 05/04/2023 09:55:29 -------------------------------------------------------------------------------- Multi-Disciplinary Care Plan Details Patient Name: Date of Service: Donna Nab. 05/04/2023 9:30 A M Medical Record Number: [id number] Patient Account Number: [id number] Date of Birth/Sex: Treating RN: [birth year] (77 y.o. Donna Rhodes Primary Care Kemora Pinard: Dale [place] Other Clinician: Referring Tippi Mccrae: Treating Yeison Sippel/Extender: Antony Blackbird Weeks in Treatment: 0 Active Inactive Electronic Signature(s) Signed: 05/05/2023 9:53:20 AM By: Yevonne Pax RN Entered By: Yevonne Pax on 05/04/2023 10:31:13 -------------------------------------------------------------------------------- Pain Assessment Details Patient Name: Date of Service: [facility] 05/04/2023 9:30 A M Medical Record Number: [id number] Patient Account Number: [id number] Date of Birth/Sex: Treating RN: 02-Jun-1946 (77 y.o. Donna Rhodes Primary Care Zyon Grout: Dale [place] Other Clinician: Referring Dilan Fullenwider: Treating Taleisha Kaczynski/Extender: Antony Blackbird Weeks in Treatment: 0 Active Problems Location of Pain Severity and Description of Pain Patient Has Paino No Site Locations [place] ([id number]) [id number] Page 6 of 7 Pain Management and Medication Current Pain Management: Electronic Signature(s) Signed: 05/05/2023 9:53:20 AM By: Yevonne Pax RN Entered By: Yevonne Pax on 05/04/2023 09:38:54 -------------------------------------------------------------------------------- Patient/Caregiver Education Details Patient Name: Date of Service: Donna Nab 5/7/2024andnbsp9:30 A M Medical Record Number: [id number] Patient  Account Number: [id number] Date of Birth/Gender: Treating RN: 19-Apr-1946 (77 y.o. Donna Rhodes Primary Care Physician: Dale [place] Other Clinician: Referring Physician: Treating Physician/Extender: Wynonia Lawman in Treatment: 0 Education Assessment Education Provided To: Patient Education Topics Provided Welcome T [facility]New Patient Packet: o Handouts: Welcome T [facility] o Methods: Explain/Verbal Responses: State content correctly Electronic Signature(s) Signed: 05/05/2023 9:53:20 AM By: Yevonne Pax RN Entered By: Yevonne Pax on 05/04/2023 10:31:51 -------------------------------------------------------------------------------- Vitals Details Patient Name: Date of Service: Donna Nab. 05/04/2023 9:30 A M Medical Record Number: [id number] Patient Account Number: [id number] Date of Birth/Sex: Treating RN: 01-03-1946 (77 y.o. Donna Rhodes Primary Care Manda Holstad: Dale  Other Clinician: Referring Currie Dennin: Treating Greysen Devino/Extender: Antony Blackbird Weeks in Treatment: 0 Vital Signs Time Taken: 09:39 Temperature (F): 97.8 Height (in): 64 Pulse (bpm): 69 Source: Stated Respiratory Rate (breaths/min): 18 Weight (lbs): 102 Blood Pressure (mmHg): 159/99 JAKEA, WILLETT ([id number]) [id number] Page 7 of 7 Source: Stated Reference Range: 80 - 120 mg / dl Body Mass Index (BMI): 17.5 Electronic Signature(s) Signed: 05/05/2023 9:53:20 AM By: Yevonne Pax RN Entered  ByYevonne Pax on 05/04/2023 09:39:34

## 2023-05-10 ENCOUNTER — Ambulatory Visit: Payer: PPO | Admitting: Physician Assistant

## 2023-05-14 ENCOUNTER — Other Ambulatory Visit (INDEPENDENT_AMBULATORY_CARE_PROVIDER_SITE_OTHER): Payer: PPO

## 2023-05-14 DIAGNOSIS — E7800 Pure hypercholesterolemia, unspecified: Secondary | ICD-10-CM

## 2023-05-14 LAB — BASIC METABOLIC PANEL
BUN: 21 mg/dL (ref 6–23)
CO2: 28 mEq/L (ref 19–32)
Calcium: 9.2 mg/dL (ref 8.4–10.5)
Chloride: 107 mEq/L (ref 96–112)
Creatinine, Ser: 0.79 mg/dL (ref 0.40–1.20)
GFR: 72.11 mL/min (ref >60.00)
Glucose, Bld: 84 mg/dL (ref 70–99)
Potassium: 4 mEq/L (ref 3.5–5.1)
Sodium: 141 mEq/L (ref 135–145)

## 2023-05-14 LAB — HEPATIC FUNCTION PANEL
ALT: 16 U/L (ref 0–35)
AST: 21 U/L (ref 0–37)
Albumin: 4.1 g/dL (ref 3.5–5.2)
Alkaline Phosphatase: 81 U/L (ref 39–117)
Bilirubin, Direct: 0.2 mg/dL (ref 0.0–0.3)
Total Bilirubin: 0.7 mg/dL (ref 0.2–1.2)
Total Protein: 6.8 g/dL (ref 6.0–8.3)

## 2023-05-14 LAB — LIPID PANEL
Cholesterol: 143 mg/dL (ref 0–200)
HDL: 66.1 mg/dL (ref >39.00)
LDL Cholesterol: 68 mg/dL (ref 0–99)
NonHDL: 76.58
Total CHOL/HDL Ratio: 2
Triglycerides: 45 mg/dL (ref 0.0–149.0)
VLDL: 9 mg/dL (ref 0.0–40.0)

## 2023-05-19 ENCOUNTER — Ambulatory Visit (INDEPENDENT_AMBULATORY_CARE_PROVIDER_SITE_OTHER): Payer: PPO | Admitting: Internal Medicine

## 2023-05-19 VITALS — BP 134/78 | HR 76 | Temp 97.9°F | Resp 16 | Ht 64.0 in | Wt 102.4 lb

## 2023-05-19 DIAGNOSIS — Z8673 Personal history of transient ischemic attack (TIA), and cerebral infarction without residual deficits: Secondary | ICD-10-CM | POA: Diagnosis not present

## 2023-05-19 DIAGNOSIS — K219 Gastro-esophageal reflux disease without esophagitis: Secondary | ICD-10-CM

## 2023-05-19 DIAGNOSIS — R87622 Low grade squamous intraepithelial lesion on cytologic smear of vagina (LGSIL): Secondary | ICD-10-CM | POA: Diagnosis not present

## 2023-05-19 DIAGNOSIS — F4323 Adjustment disorder with mixed anxiety and depressed mood: Secondary | ICD-10-CM | POA: Diagnosis not present

## 2023-05-19 DIAGNOSIS — E7800 Pure hypercholesterolemia, unspecified: Secondary | ICD-10-CM | POA: Diagnosis not present

## 2023-05-19 DIAGNOSIS — E538 Deficiency of other specified B group vitamins: Secondary | ICD-10-CM

## 2023-05-19 DIAGNOSIS — R7989 Other specified abnormal findings of blood chemistry: Secondary | ICD-10-CM

## 2023-05-19 DIAGNOSIS — E039 Hypothyroidism, unspecified: Secondary | ICD-10-CM | POA: Diagnosis not present

## 2023-05-19 DIAGNOSIS — R42 Dizziness and giddiness: Secondary | ICD-10-CM | POA: Diagnosis not present

## 2023-05-19 DIAGNOSIS — I10 Essential (primary) hypertension: Secondary | ICD-10-CM

## 2023-05-19 DIAGNOSIS — F320 Major depressive disorder, single episode, mild: Secondary | ICD-10-CM

## 2023-05-19 DIAGNOSIS — R002 Palpitations: Secondary | ICD-10-CM

## 2023-05-19 DIAGNOSIS — M0600 Rheumatoid arthritis without rheumatoid factor, unspecified site: Secondary | ICD-10-CM

## 2023-05-19 DIAGNOSIS — L989 Disorder of the skin and subcutaneous tissue, unspecified: Secondary | ICD-10-CM | POA: Diagnosis not present

## 2023-05-19 DIAGNOSIS — G8320 Monoplegia of upper limb affecting unspecified side: Secondary | ICD-10-CM | POA: Diagnosis not present

## 2023-05-19 DIAGNOSIS — E559 Vitamin D deficiency, unspecified: Secondary | ICD-10-CM

## 2023-05-19 NOTE — Progress Notes (Signed)
Subjective:    Patient ID: Donna Rhodes, female    DOB: 31-Jan-1946, 77 y.o.   MRN: [id number]  Patient here for  Chief Complaint  Patient presents with   Medical Management of Chronic Issues    HPI Here to follow up regarding her cholesterol, blood pressure and increased stress. History of CVA. Seeing rheumatology - f/u RA. On MTX. Overall stable.  Recently evaluated - persistent leg lesion. Evaluated - wound clinic - lower leg lesion. Better.  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 Spells of disequilibrium without dizziness, photophobia, nausea, episodes can last a full day. Negative MRI 04/25/21, negative CT 06/11/22, negative evaluation by ENT. Recommended starting magnesium.  Recommended PT.  Also given some concern regarding cognitive impairment - recommended f/u MRI.  Also found to have low vitamin D level.  Recommended starting vitamin D3 and B12.  Tries to stay active.  No chest pain or sob reported.  No abdominal pain reported.  Has f/u 6/3 - [facility].   Past Medical History:  Diagnosis Date   Actinic keratosis    Atrophic vaginitis    Dysplasia of vagina    vin 1 - vulva and vaginal cuff   GERD (gastroesophageal reflux disease)    HCV infection    History of basal cell carcinoma (BCC) 04/11/2019   right lat lower eyelid margin Mohs 2020 Dr. Adriana Simas   HSV (herpes simplex virus) infection    Hypercholesterolemia    Hypertension    Hypothyroidism    Left-sided weakness    S/P CVA 4/18.  PT 2x/wk.   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   Thyroid nodule    pt unaware of this    Vaginal Pap smear, abnormal    lgsil   Past Surgical History:  Procedure Laterality Date   ADENOIDECTOMY     APPENDECTOMY  1965   BUNIONECTOMY  1994   left   CATARACT EXTRACTION W/PHACO Right 10/27/2017    Procedure: CATARACT EXTRACTION PHACO AND INTRAOCULAR LENS PLACEMENT (IOC) RIGHT;  Surgeon: Lockie Mola, MD;  Location: [facility];  Service: Ophthalmology;  Laterality: Right;  requests early (8-8:30 arrival)   CATARACT EXTRACTION W/PHACO Left 11/10/2017   Procedure: CATARACT EXTRACTION PHACO AND INTRAOCULAR LENS PLACEMENT (IOC) LEFT;  Surgeon: Lockie Mola, MD;  Location: [facility];  Service: Ophthalmology;  Laterality: Left;   CHOLECYSTECTOMY     FINGER SURGERY  1982   laser vein surgery     LOOP RECORDER INSERTION N/A 09/24/2017   Procedure: LOOP RECORDER INSERTION;  Surgeon: Marinus Maw, MD;  Location: [facility];  Service: Cardiovascular;  Laterality: N/A;   MANDIBLE RECONSTRUCTION  1992   TOENAIL EXCISION  1998   several   TONSILLECTOMY     TUBAL LIGATION  1977   VAGINAL HYSTERECTOMY  1983   als0- anterior/posterior colporrhaphy   VAGINAL WOUND CLOSURE / REPAIR  1999, 2000   VAGINAL WOUND CLOSURE / REPAIR     Family History  Problem Relation Age of Onset   Hypertension Father    Dementia Father    Osteoarthritis Mother    Arthritis/Rheumatoid Sister    Breast cancer Other        paternal side   Breast cancer Paternal Aunt  Breast cancer Cousin        maternal   Uterine cancer Maternal Grandmother    Ovarian cancer Neg Hx    Heart disease Neg Hx    Diabetes Neg Hx    Colon cancer Neg Hx    Social History   Socioeconomic History   Marital status: Divorced    Spouse name: Not on file   Number of children: 3   Years of education: 12   Highest education level: Not on file  Occupational History    Comment: retired  Tobacco Use   Smoking status: Former    Types: Cigarettes    Quit date: 12/29/1979    Years since quitting: 43.4   Smokeless tobacco: Never  Vaping Use   Vaping Use: Never used  Substance and Sexual Activity   Alcohol use: No    Alcohol/week: 0.0 standard drinks of alcohol   Drug use: No   Sexual  activity: Not Currently    Birth control/protection: Surgical  Other Topics Concern   Not on file  Social History Narrative   Her mother and her daughter live with her   Caffeine- 1 cup coffee   Social Determinants of Health   Financial Resource Strain: Low Risk  (02/24/2021)   Overall Financial Resource Strain (CARDIA)    Difficulty of Paying Living Expenses: Not hard at all  Food Insecurity: No Food Insecurity (02/24/2021)   Hunger Vital Sign    Worried About Running Out of Food in the Last Year: Never true    Ran Out of Food in the Last Year: Never true  Transportation Needs: No Transportation Needs (02/24/2021)   PRAPARE - Transportation    Lack of Transportation (Medical): No    Lack of Transportation (Non-Medical): No  Physical Activity: Not on file  Stress: No Stress Concern Present (02/24/2021)   [employer] of Occupational Health - Occupational Stress Questionnaire    Feeling of Stress : Only a little  Social Connections: Unknown (02/24/2021)   Social Connection and Isolation Panel [NHANES]    Frequency of Communication with Friends and Family: More than three times a week    Frequency of Social Gatherings with Friends and Family: More than three times a week    Attends Religious Services: Not on Marketing executive or Organizations: Not on file    Attends Banker Meetings: Not on file    Marital Status: Not on file     Review of Systems  Constitutional:  Negative for appetite change and unexpected weight change.  HENT:  Negative for congestion and sinus pressure.   Respiratory:  Negative for cough, chest tightness and shortness of breath.   Cardiovascular:  Negative for chest pain and palpitations.       No increased swelling.   Gastrointestinal:  Negative for abdominal pain, diarrhea, nausea and vomiting.  Genitourinary:  Negative for difficulty urinating and dysuria.  Musculoskeletal:  Negative for joint swelling and myalgias.  Skin:   Negative for color change and rash.  Neurological:        No increased dizziness.  No change - headache.   Psychiatric/Behavioral:  Negative for agitation and dysphoric mood.        Objective:     BP 134/78   Pulse 76   Temp 97.9 F (36.6 C)   Resp 16   Ht 5\' 4"  (1.626 m)   Wt 102 lb 6.4 oz (46.4 kg)   LMP 12/29/1981   SpO2  98%   BMI 17.58 kg/m  Wt Readings from Last 3 Encounters:  05/19/23 102 lb 6.4 oz (46.4 kg)  04/28/23 103 lb 6.4 oz (46.9 kg)  04/15/23 101 lb 8 oz (46 kg)    Physical Exam Vitals reviewed.  Constitutional:      General: She is not in acute distress.    Appearance: Normal appearance.  HENT:     Head: Normocephalic and atraumatic.     Right Ear: External ear normal.     Left Ear: External ear normal.  Eyes:     General: No scleral icterus.       Right eye: No discharge.        Left eye: No discharge.     Conjunctiva/sclera: Conjunctivae normal.  Neck:     Thyroid: No thyromegaly.  Cardiovascular:     Rate and Rhythm: Normal rate and regular rhythm.  Pulmonary:     Effort: No respiratory distress.     Breath sounds: Normal breath sounds. No wheezing.  Abdominal:     General: Bowel sounds are normal.     Palpations: Abdomen is soft.     Tenderness: There is no abdominal tenderness.  Musculoskeletal:        General: No tenderness.     Cervical back: Neck supple. No tenderness.     Comments: No increased swelling.   Lymphadenopathy:     Cervical: No cervical adenopathy.  Skin:    Findings: No erythema or rash.  Neurological:     Mental Status: She is alert.  Psychiatric:        Mood and Affect: Mood normal.        Behavior: Behavior normal.      Outpatient Encounter Medications as of 05/19/2023  Medication Sig   acetaminophen (TYLENOL) 500 MG tablet Take 1-2 tablets (500-1,000 mg total) by mouth every 6 (six) hours as needed for mild pain.   acyclovir (ZOVIRAX) 200 MG capsule Take 1 capsule (200 mg total) by mouth 2 (two) times  daily.   amLODipine (NORVASC) 2.5 MG tablet Take 1 tablet (2.5 mg total) by mouth daily.   atorvastatin (LIPITOR) 20 MG tablet TAKE 1 TABLET BY MOUTH DAILY   calcium-vitamin D (OSCAL WITH D) 250-125 MG-UNIT tablet Take 1 tablet by mouth daily.   Cholecalciferol (VITAMIN D3 PO) Take by mouth.   clopidogrel (PLAVIX) 75 MG tablet Take 1 tablet (75 mg total) by mouth daily.   folic acid (FOLVITE) 1 MG tablet Take 1 mg by mouth daily.   ketoconazole (NIZORAL) 2 % shampoo Apply 1 Application topically 2 (two) times a week.   levothyroxine (SYNTHROID) 50 MCG tablet Take 1 tablet (50 mcg total) by mouth daily before breakfast.   methotrexate 2.5 MG tablet Take 10 mg by mouth once a week.   mupirocin ointment (BACTROBAN) 2 % Apply 1 Application topically 2 (two) times daily.   vitamin B-12 (CYANOCOBALAMIN) 100 MCG tablet Take 100 mcg by mouth daily.   [DISCONTINUED] MAGNESIUM PO Take by mouth.   No facility-administered encounter medications on file as of 05/19/2023.     Lab Results  Component Value Date   WBC 4.9 11/03/2022   HGB 12.3 11/03/2022   HCT 37.9 11/03/2022   PLT 241.0 11/03/2022   GLUCOSE 84 05/14/2023   CHOL 143 05/14/2023   TRIG 45.0 05/14/2023   HDL 66.10 05/14/2023   LDLDIRECT 128.0 05/25/2013   LDLCALC 68 05/14/2023   ALT 16 05/14/2023   AST 21 05/14/2023   NA  141 05/14/2023   K 4.0 05/14/2023   CL 107 05/14/2023   CREATININE 0.79 05/14/2023   BUN 21 05/14/2023   CO2 28 05/14/2023   TSH 2.05 02/10/2023   HGBA1C 5.7 (H) 04/25/2017    MR BRAIN WO CONTRAST  Result Date: 03/01/2023 CLINICAL DATA:  Occipital head tightness. History of prior stroke with left arm weakness. Frequent falling. EXAM: MRI HEAD WITHOUT CONTRAST TECHNIQUE: Multiplanar, multiecho pulse sequences of the brain and surrounding structures were obtained without intravenous contrast. COMPARISON:  Head CT 06/11/2022.  MRI 04/25/2021. FINDINGS: Brain: Diffusion imaging does not show any acute or subacute  infarction. No primary abnormality affects the brainstem. Somewhat layering degeneration on the right. No focal cerebellar insult. Cerebral hemispheres show old infarction in the right basal ganglia and radiating white matter tracts. Elsewhere there are mild chronic small-vessel ischemic changes of the white matter. No large vessel territory stroke. No mass, hemorrhage, hydrocephalus or extra-axial collection. Minimal hemosiderin deposition in the old right-sided stroke. Vascular: Major vessels at the base of the brain show flow. Skull and upper cervical spine: Negative Sinuses/Orbits: Clear/normal Other: None IMPRESSION: No acute or reversible finding. Old infarction in the right basal ganglia and radiating white matter tracts. Mild chronic small-vessel ischemic changes elsewhere affecting the cerebral hemispheric white matter. Electronically Signed   By: Paulina Fusi M.D.   On: 03/01/2023 09:55       Assessment & Plan:  Abnormal liver function test Assessment & Plan: Previous ultrasound revealed fatty liver.  Recent liver panel wnl.     Adjustment disorder with mixed anxiety and depressed mood Assessment & Plan: Increased stress.  Have discussed.  Have discussed referral to therapist or psychiatry.  She has declined. Declines any further intervention.  Follow.    B12 deficiency Assessment & Plan: Check 12 level with next labs. Oral B12.   Orders: -     Vitamin B12; Future  Benign essential HTN Assessment & Plan: Blood pressure as outlined.  Continue amlodipine.  Follow pressures.  Follow metabolic panel.   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 Spells of disequilibrium without dizziness, photophobia, nausea, episodes can last a full day. Negative MRI 04/25/21, negative CT 06/11/22, negative evaluation by ENT. Recommended starting magnesium.  Recommended PT.  Also given some concern regarding  cognitive impairment - recommended f/u MRI.  MRI 03/01/23 - No acute or reversible finding. Old infarction in the right basal ganglia and radiating white matter tracts. Mild chronic small-vessel ischemic changes elsewhere affecting the cerebral hemispheric white matter. Did recommend PT.    Essential hypertension Assessment & Plan: Continue amlodipine.  Blood pressure as outlined.  Follow pressures.  Follow metabolic panel.    Flaccid monoplegia of upper extremity (HCC) Assessment & Plan: Still some limitations.  Continue exercise.  Follow.    Gastroesophageal reflux disease, unspecified whether esophagitis present Assessment & Plan: Continue protonix.  No acid reflux reported.  Follow.    History of CVA (cerebrovascular accident) Assessment & Plan: Continue plavix. Has been on lipitor.   Follow lipid panel.    Hypercholesterolemia Assessment & Plan: On lipitor.  Follow lipid panel and liver function tests.    Orders: -     CBC with Differential/Platelet; Future -     Hepatic function panel; Future -     Lipid panel; Future  Hypothyroidism, unspecified type Assessment & Plan: On thyroid replacement.  Follow tsh.   Orders: -  TSH; Future  Leg lesion Assessment & Plan: Improved.  Evaluated wound clinic as outlined.    LGSIL Pap smear of vagina Assessment & Plan: Has been followed by gyn.  Recently evaluated by Dr Logan Bores.  Per gyn: Abnormal Paps of the vaginal cuff were discussed. Patient previously had LGSIL but a negative colposcopy.  Based on her low risk they discussed the possibility of a repeat Pap or simply considering her aged out of Pap smears because her risk of vaginal cancer is very low.  Per review of note, they reported she would like consider these options.  Have discussed f/u pap.  She will notify gyn if desires.    Major depressive disorder, single episode, mild (HCC) Assessment & Plan: Increased stress as outlined.  Have discussed.  Has declined any  further intervention.  Follow.    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, involving unspecified site [facility]) Assessment & Plan: On MTX.  Seeing rheumatology (Dr DeFoor).  Stable.    Palpitations Assessment & Plan: She initially had an ILR inserted post CVA - but had no evidence of atrial fib. Had removed. She notes occaisional palpitations which were due to PVC's and PAC's when she had an ILR. Had f/u with Dr Ladona Ridgel 01/2023.  Continue plavix.    Vitamin D deficiency Assessment & Plan: Vitamin D3 1000 units per day.  Recheck vitamin D with next labs.   Orders: -     VITAMIN D 25 Hydroxy (Vit-D Deficiency, Fractures); Future     Dale Whitefield, MD

## 2023-05-30 ENCOUNTER — Encounter: Payer: Self-pay | Admitting: Internal Medicine

## 2023-05-30 DIAGNOSIS — I890 Lymphedema, not elsewhere classified: Secondary | ICD-10-CM | POA: Insufficient documentation

## 2023-05-30 DIAGNOSIS — E559 Vitamin D deficiency, unspecified: Secondary | ICD-10-CM | POA: Insufficient documentation

## 2023-05-30 NOTE — Assessment & Plan Note (Signed)
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 Spells of disequilibrium without dizziness, photophobia, nausea, episodes can last a full day. Negative MRI 04/25/21, negative CT 06/11/22, negative evaluation by ENT. Recommended starting magnesium.  Recommended PT.  Also given some concern regarding cognitive impairment - recommended f/u MRI.  MRI 03/01/23 - No acute or reversible finding. Old infarction in the right basal ganglia and radiating white matter tracts. Mild chronic small-vessel ischemic changes elsewhere affecting the cerebral hemispheric white matter. Did recommend PT.

## 2023-05-30 NOTE — Assessment & Plan Note (Signed)
Continue protonix.  No acid reflux reported.  Follow.  

## 2023-05-30 NOTE — Assessment & Plan Note (Signed)
She initially had an ILR inserted post CVA - but had no evidence of atrial fib. Had removed. She notes occaisional palpitations which were due to PVC's and PAC's when she had an ILR. Had f/u with Dr Ladona Ridgel 01/2023.  Continue plavix.

## 2023-05-30 NOTE — Assessment & Plan Note (Signed)
On thyroid replacement.  Follow tsh.  

## 2023-05-30 NOTE — Assessment & Plan Note (Signed)
Continue plavix. Has been on lipitor.   Follow lipid panel.  

## 2023-05-30 NOTE — Assessment & Plan Note (Signed)
Improved.  Evaluated wound clinic as outlined.

## 2023-05-30 NOTE — Assessment & Plan Note (Signed)
Check 12 level with next labs. Oral B12.

## 2023-05-30 NOTE — Assessment & Plan Note (Signed)
Vitamin D3 1000 units per day.  Recheck vitamin D with next labs.

## 2023-05-30 NOTE — Assessment & Plan Note (Signed)
Increased stress.  Have discussed.  Have discussed referral to therapist or psychiatry.  She has declined. Declines any further intervention.  Follow.  

## 2023-05-30 NOTE — Assessment & Plan Note (Signed)
On lipitor.  Follow lipid panel and liver function tests.   

## 2023-05-30 NOTE — Assessment & Plan Note (Signed)
Continue amlodipine. Blood pressure as outlined.  Follow pressures.  Follow metabolic panel.  °

## 2023-05-30 NOTE — Progress Notes (Signed)
MRN : [id number]  Donna Rhodes is a 77 y.o. (Apr 27, 1946) female who presents with chief complaint of legs swell.  History of Present Illness:   Patient is seen for evaluation of leg swelling. The patient first noticed the swelling remotely but is now concerned because of a significant increase in the overall edema. The swelling isn't associated with significant pain.  There has been an increasing amount of  discoloration noted by the patient. The patient notes that in the morning the legs are improved but they steadily worsened throughout the course of the day. Elevation seems to make the swelling of the legs better, dependency makes them much worse.   There is a history of ulceration associated with the swelling.   This was after an injury in April 2024.  She has been seen at the walk in clinic and followed by the wound center.  The patient denies any recent changes in their medications.  The patient has not been wearing graduated compression.  The patient has no had any past angiography, interventions or vascular surgery.  The patient denies a history of DVT or PE. There is no prior history of phlebitis. There is no history of primary lymphedema.  There is no history of radiation treatment to the groin or pelvis No history of malignancies. No history of trauma or groin or pelvic surgery. No history of foreign travel or parasitic infections area   No outpatient medications have been marked as taking for the 05/31/23 encounter (Appointment) with Gilda Crease, Latina Craver, MD.    Past Medical History:  Diagnosis Date   Actinic keratosis    Atrophic vaginitis    Dysplasia of vagina    vin 1 - vulva and vaginal cuff   GERD (gastroesophageal reflux disease)    HCV infection    History of basal cell carcinoma (BCC) 04/11/2019   right lat lower eyelid margin Mohs 2020 Dr. 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  PT 2x/wk.   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   Thyroid nodule    pt unaware of this    Vaginal Pap smear, abnormal    lgsil    Past Surgical History:  Procedure Laterality Date   ADENOIDECTOMY     APPENDECTOMY  1965   BUNIONECTOMY  1994   left   CATARACT EXTRACTION W/PHACO Right 10/27/2017   Procedure: CATARACT EXTRACTION PHACO AND INTRAOCULAR LENS PLACEMENT (IOC) RIGHT;  Surgeon: Lockie Mola, MD;  Location: [facility];  Service: Ophthalmology;  Laterality: Right;  requests early (8-8:30 arrival)   CATARACT EXTRACTION W/PHACO Left 11/10/2017   Procedure: CATARACT EXTRACTION PHACO AND INTRAOCULAR LENS PLACEMENT (IOC) LEFT;  Surgeon: Lockie Mola, MD;  Location: [facility];  Service: Ophthalmology;  Laterality: Left;   CHOLECYSTECTOMY     FINGER SURGERY  1982   laser vein surgery     LOOP RECORDER INSERTION N/A 09/24/2017   Procedure: LOOP RECORDER INSERTION;  Surgeon: Marinus Maw, MD;  Location: [facility];  Service: Cardiovascular;  Laterality: N/A;   MANDIBLE RECONSTRUCTION  1992   TOENAIL EXCISION  1998   several   TONSILLECTOMY     TUBAL LIGATION  1977   VAGINAL HYSTERECTOMY  1983   als0- anterior/posterior colporrhaphy   VAGINAL WOUND CLOSURE / REPAIR  1999, 2000   VAGINAL WOUND CLOSURE / REPAIR      Social History Social History   Tobacco Use   Smoking status: Former    Types: Cigarettes    Quit date: 12/29/1979    Years since quitting: 43.4   Smokeless tobacco: Never  Vaping Use   Vaping Use: Never used  Substance Use Topics   Alcohol use: No    Alcohol/week: 0.0 standard drinks of alcohol   Drug use: No    Family History Family History  Problem Relation Age of Onset    Hypertension Father    Dementia Father    Osteoarthritis Mother    Arthritis/Rheumatoid Sister    Breast cancer Other        paternal side   Breast cancer Paternal Aunt    Breast cancer Cousin        maternal   Uterine cancer Maternal Grandmother    Ovarian cancer Neg Hx    Heart disease Neg Hx    Diabetes Neg Hx    Colon cancer Neg Hx     Allergies  Allergen Reactions   Amoxicillin Other (See Comments)    Has patient had a PCN reaction causing immediate rash, facial/tongue/throat swelling, SOB or lightheadedness with hypotension: No Has patient had a PCN reaction causing severe rash involving mucus membranes or skin necrosis: No Has patient had a PCN reaction that required hospitalization: No Has patient had a PCN reaction occurring within the last 10 years: No If all of the above answers are "NO", then may proceed with Cephalosporin use.   Irritated tongue   Avelox [Moxifloxacin Hcl In Nacl]     Tongue irritation   Azithromycin Other (See Comments)    Caused mouth and tongue to be sore   Bacitracin     Itching and rash    Codeine     "feels like I'm in a barrel"   Estroven Weight Management [Nutritional Supplements]     Mouth soreness   Fish Oil     Other reaction(s): Other (See Comments) Mouth soreness Mouth soreness   Food     Walnuts --mouth breaks out   Moxifloxacin Other (See Comments)    Tongue irritation   Naproxen     Tongue irritation   Nsaids Other (See Comments)    Oral inflammation   Cefepime Rash     REVIEW OF SYSTEMS (Negative unless checked)  Constitutional: [] Weight loss  [] Fever  [] Chills Cardiac: [] Chest pain   [] Chest pressure   [] Palpitations   [] Shortness of breath when laying flat   [] Shortness of breath with exertion. Vascular:  [] Pain in legs with walking   [x] Pain in legs with standing  [] History of DVT   [] Phlebitis   [x] Swelling in legs   [] Varicose veins   [] Non-healing ulcers Pulmonary:   [] Uses home oxygen   [] Productive cough    [] Hemoptysis   [] Wheeze  [] COPD   [] Asthma Neurologic:  [] Dizziness   [] Seizures   [] History of stroke   [] History of TIA  [] Aphasia   [] Vissual changes   [] Weakness or numbness in arm   [] Weakness or numbness in leg Musculoskeletal:   [] Joint swelling   []   Joint pain   [] Low back pain Hematologic:  [] Easy bruising  [] Easy bleeding   [] Hypercoagulable state   [] Anemic Gastrointestinal:  [] Diarrhea   [] Vomiting  [] Gastroesophageal reflux/heartburn   [] Difficulty swallowing. Genitourinary:  [] Chronic kidney disease   [] Difficult urination  [] Frequent urination   [] Blood in urine Skin:  [] Rashes   [] Ulcers  Psychological:  [] History of anxiety   []  History of major depression.  Physical Examination  There were no vitals filed for this visit. There is no height or weight on file to calculate BMI. Gen: WD/WN, NAD Head: [place]/AT, No temporalis wasting.  Ear/Nose/Throat: Hearing grossly intact, nares w/o erythema or drainage, pinna without lesions Eyes: PER, EOMI, sclera nonicteric.  Neck: Supple, no gross masses.  No JVD.  Pulmonary:  Good air movement, no audible wheezing, no use of accessory muscles.  Cardiac: RRR, precordium not hyperdynamic. Vascular:  scattered varicosities present bilaterally.  Mild venous stasis changes to the legs bilaterally.  3-4+ soft pitting edema, CEAP C4sEpAsPr.  Ulcer left shin uninfected Vessel Right Left  Radial Palpable Palpable  Gastrointestinal: soft, non-distended. No guarding/no peritoneal signs.  Musculoskeletal: M/S 5/5 throughout.  No deformity.  Neurologic: CN 2-12 intact. Pain and light touch intact in extremities.  Symmetrical.  Speech is fluent. Motor exam as listed above. Psychiatric: Judgment intact, Mood & affect appropriate for pt's clinical situation. Dermatologic: Venous rashes no ulcers noted.  No changes consistent with cellulitis. Lymph : No lichenification or skin changes of chronic lymphedema.  CBC Lab Results  Component Value Date    WBC 4.9 11/03/2022   HGB 12.3 11/03/2022   HCT 37.9 11/03/2022   MCV 96.7 11/03/2022   PLT 241.0 11/03/2022    BMET    Component Value Date/Time   NA 141 05/14/2023 0821   K 4.0 05/14/2023 0821   CL 107 05/14/2023 0821   CO2 28 05/14/2023 0821   GLUCOSE 84 05/14/2023 0821   BUN 21 05/14/2023 0821   CREATININE 0.79 05/14/2023 0821   CALCIUM 9.2 05/14/2023 0821   GFRNONAA >60 05/18/2017 0442   GFRAA >60 05/18/2017 0442   Estimated Creatinine Clearance: 43.1 mL/min (by C-G formula based on SCr of 0.79 mg/dL).  COAG No results found for: "INR", "PROTIME"  Radiology No results found.   Assessment/Plan 1. Venous ulcer of ankle, left (HCC) Recommend:  I have had a long discussion with the patient regarding swelling and why it  causes symptoms.  Patient will begin wearing graduated compression on a daily basis a prescription was given. The patient will  wear the stockings first thing in the morning and removing them in the evening. The patient is instructed specifically not to sleep in the stockings.   In addition, behavioral modification will be initiated.  This will include frequent elevation, use of over the counter pain medications and exercise such as walking.  Consideration for a lymph pump will also be made based upon the effectiveness of conservative therapy.  This would help to improve the edema control and prevent sequela such as ulcers and infections   Patient should undergo duplex ultrasound of the venous system to ensure that DVT or reflux is not present.  The patient will follow-up with me after the ultrasound.  - VAS [place] LOWER EXTREMITY VENOUS (DVT); Future  2. Lymphedema Recommend:  I have had a long discussion with the patient regarding swelling and why it  causes symptoms.  Patient will begin wearing graduated compression on a daily basis a prescription was given. The patient will  wear the stockings first thing in the morning and removing them in the evening.  The patient is instructed specifically not to sleep in the stockings.   In addition, behavioral modification will be initiated.  This will include frequent elevation, use of over the counter pain medications and exercise such as walking.  Consideration for a lymph pump will also be made based upon the effectiveness of conservative therapy.  This would help to improve the edema control and prevent sequela such as ulcers and infections   Patient should undergo duplex ultrasound of the venous system to ensure that DVT or reflux is not present.  The patient will follow-up with me after the ultrasound.   3. Hypercholesterolemia Continue statin as ordered and reviewed, no changes at this time  4. Osteoarthritis of both hands, unspecified osteoarthritis type Continue NSAID medications as already ordered, these medications have been reviewed and there are no changes at this time.  Continued activity and therapy was stressed.  5. Gastroesophageal reflux disease, unspecified whether esophagitis present Continue PPI as already ordered, this medication has been reviewed and there are no changes at this time.  Avoidence of caffeine and alcohol  Moderate elevation of the head of the bed   6. Essential hypertension Continue antihypertensive medications as already ordered, these medications have been reviewed and there are no changes at this time.    Levora Dredge, MD  05/30/2023 2:57 PM

## 2023-05-30 NOTE — Assessment & Plan Note (Signed)
Increased stress as outlined.  Have discussed.  Has declined any further intervention.  Follow.  

## 2023-05-30 NOTE — Assessment & Plan Note (Signed)
Still some limitations.  Continue exercise.  Follow.  

## 2023-05-30 NOTE — Assessment & Plan Note (Signed)
Blood pressure as outlined.  Continue amlodipine.  Follow pressures.  Follow metabolic panel.  

## 2023-05-30 NOTE — Assessment & Plan Note (Signed)
On MTX.  Seeing rheumatology (Dr DeFoor).  Stable.  

## 2023-05-30 NOTE — Assessment & Plan Note (Signed)
Previous ultrasound revealed fatty liver.  Recent liver panel wnl.   

## 2023-05-30 NOTE — Assessment & Plan Note (Signed)
Has been followed by gyn.  Recently evaluated by Dr Evans.  Per gyn: Abnormal Paps of the vaginal cuff were discussed. Patient previously had LGSIL but a negative colposcopy.  Based on her low risk they discussed the possibility of a repeat Pap or simply considering her aged out of Pap smears because her risk of vaginal cancer is very low.  Per review of note, they reported she would like consider these options.  Have discussed f/u pap.  She will notify gyn if desires.  

## 2023-05-31 ENCOUNTER — Encounter (INDEPENDENT_AMBULATORY_CARE_PROVIDER_SITE_OTHER): Payer: Self-pay | Admitting: Vascular Surgery

## 2023-05-31 ENCOUNTER — Ambulatory Visit (INDEPENDENT_AMBULATORY_CARE_PROVIDER_SITE_OTHER): Payer: PPO | Admitting: Vascular Surgery

## 2023-05-31 VITALS — BP 144/77 | HR 69 | Resp 18 | Ht 64.0 in | Wt 102.6 lb

## 2023-05-31 DIAGNOSIS — I890 Lymphedema, not elsewhere classified: Secondary | ICD-10-CM

## 2023-05-31 DIAGNOSIS — I10 Essential (primary) hypertension: Secondary | ICD-10-CM | POA: Diagnosis not present

## 2023-05-31 DIAGNOSIS — M19041 Primary osteoarthritis, right hand: Secondary | ICD-10-CM

## 2023-05-31 DIAGNOSIS — I83023 Varicose veins of left lower extremity with ulcer of ankle: Secondary | ICD-10-CM | POA: Diagnosis not present

## 2023-05-31 DIAGNOSIS — L97329 Non-pressure chronic ulcer of left ankle with unspecified severity: Secondary | ICD-10-CM | POA: Diagnosis not present

## 2023-05-31 DIAGNOSIS — K219 Gastro-esophageal reflux disease without esophagitis: Secondary | ICD-10-CM

## 2023-05-31 DIAGNOSIS — M19042 Primary osteoarthritis, left hand: Secondary | ICD-10-CM | POA: Diagnosis not present

## 2023-05-31 DIAGNOSIS — E7800 Pure hypercholesterolemia, unspecified: Secondary | ICD-10-CM

## 2023-06-06 ENCOUNTER — Encounter (INDEPENDENT_AMBULATORY_CARE_PROVIDER_SITE_OTHER): Payer: Self-pay | Admitting: Vascular Surgery

## 2023-06-06 DIAGNOSIS — I83023 Varicose veins of left lower extremity with ulcer of ankle: Secondary | ICD-10-CM | POA: Insufficient documentation

## 2023-06-06 DIAGNOSIS — L97329 Non-pressure chronic ulcer of left ankle with unspecified severity: Secondary | ICD-10-CM | POA: Insufficient documentation

## 2023-06-09 DIAGNOSIS — I872 Venous insufficiency (chronic) (peripheral): Secondary | ICD-10-CM | POA: Insufficient documentation

## 2023-06-09 NOTE — Progress Notes (Signed)
MRN : [id number]  Donna Rhodes is a 77 y.o. (February 04, 1946) female who presents with chief complaint of legs hurt and swell.  History of Present Illness:   Patient is seen for evaluation of leg swelling. The patient first noticed the swelling remotely but is now concerned because of a significant increase in the overall edema. The swelling isn't associated with significant pain.  There has been an increasing amount of  discoloration noted by the patient. The patient notes that in the morning the legs are improved but they steadily worsened throughout the course of the day. Elevation seems to make the swelling of the legs better, dependency makes them much worse.    There is a history of ulceration associated with the swelling.   This was after an injury in April 2024.  The injury occurred while she was gardening and sustained trauma from an [place] bush.   The patient denies any recent changes in their medications.   The patient has not been wearing graduated compression.   The patient has no had any past angiography, interventions or vascular surgery.   The patient denies a history of DVT or PE. There is no prior history of phlebitis. There is no history of primary lymphedema.   There is no history of radiation treatment to the groin or pelvis No history of malignancies. No history of trauma or groin or pelvic surgery. No history of foreign travel or parasitic infections area   No outpatient medications have been marked as taking for the 06/10/23 encounter (Appointment) with Gilda Crease, Latina Craver, MD.    Past Medical History:  Diagnosis Date   Actinic keratosis    Atrophic vaginitis    Dysplasia of vagina    vin 1 - vulva and vaginal cuff   GERD (gastroesophageal reflux disease)    HCV infection    History of basal cell carcinoma (BCC) 04/11/2019   right lat lower eyelid margin Mohs 2020 Dr. 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  PT 2x/wk.   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   Thyroid nodule    pt unaware of this    Vaginal Pap smear, abnormal    lgsil    Past Surgical History:  Procedure Laterality Date   ADENOIDECTOMY     APPENDECTOMY  1965   BUNIONECTOMY  1994   left   CATARACT EXTRACTION W/PHACO Right 10/27/2017   Procedure: CATARACT EXTRACTION PHACO AND INTRAOCULAR LENS PLACEMENT (IOC) RIGHT;  Surgeon: Lockie Mola, MD;  Location: [facility];  Service: Ophthalmology;  Laterality: Right;  requests early (8-8:30 arrival)   CATARACT EXTRACTION W/PHACO Left 11/10/2017   Procedure: CATARACT EXTRACTION PHACO AND INTRAOCULAR LENS PLACEMENT (IOC) LEFT;  Surgeon: Lockie Mola, MD;  Location: [facility];  Service: Ophthalmology;  Laterality: Left;   CHOLECYSTECTOMY     FINGER SURGERY  1982   laser vein surgery     LOOP RECORDER INSERTION N/A 09/24/2017   Procedure: LOOP RECORDER INSERTION;  Surgeon: Marinus Maw, MD;  Location: [facility];  Service: Cardiovascular;  Laterality: N/A;   MANDIBLE  RECONSTRUCTION  1992   TOENAIL EXCISION  1998   several   TONSILLECTOMY     TUBAL LIGATION  1977   VAGINAL HYSTERECTOMY  1983   als0- anterior/posterior colporrhaphy   VAGINAL WOUND CLOSURE / REPAIR  1999, 2000   VAGINAL WOUND CLOSURE / REPAIR      Social History Social History   Tobacco Use   Smoking status: Former    Types: Cigarettes    Quit date: 12/29/1979    Years since quitting: 43.4   Smokeless tobacco: Never  Vaping Use   Vaping Use: Never used  Substance Use Topics   Alcohol use: No    Alcohol/week: 0.0 standard drinks of alcohol   Drug use: No    Family History Family History  Problem Relation Age  of Onset   Hypertension Father    Dementia Father    Osteoarthritis Mother    Arthritis/Rheumatoid Sister    Breast cancer Other        paternal side   Breast cancer Paternal Aunt    Breast cancer Cousin        maternal   Uterine cancer Maternal Grandmother    Ovarian cancer Neg Hx    Heart disease Neg Hx    Diabetes Neg Hx    Colon cancer Neg Hx     Allergies  Allergen Reactions   Amoxicillin Other (See Comments)    Has patient had a PCN reaction causing immediate rash, facial/tongue/throat swelling, SOB or lightheadedness with hypotension: No Has patient had a PCN reaction causing severe rash involving mucus membranes or skin necrosis: No Has patient had a PCN reaction that required hospitalization: No Has patient had a PCN reaction occurring within the last 10 years: No If all of the above answers are "NO", then may proceed with Cephalosporin use.   Irritated tongue   Avelox [Moxifloxacin Hcl In Nacl]     Tongue irritation   Azithromycin Other (See Comments)    Caused mouth and tongue to be sore   Bacitracin     Itching and rash    Codeine     "feels like I'm in a barrel"   Estroven Weight Management [Nutritional Supplements]     Mouth soreness   Fish Oil     Other reaction(s): Other (See Comments) Mouth soreness Mouth soreness   Food     Walnuts --mouth breaks out   Moxifloxacin Other (See Comments)    Tongue irritation   Naproxen     Tongue irritation   Nsaids Other (See Comments)    Oral inflammation   Cefepime Rash     REVIEW OF SYSTEMS (Negative unless checked)  Constitutional: [] Weight loss  [] Fever  [] Chills Cardiac: [] Chest pain   [] Chest pressure   [] Palpitations   [] Shortness of breath when laying flat   [] Shortness of breath with exertion. Vascular:  [] Pain in legs with walking   [x] Pain in legs at rest  [] History of DVT   [] Phlebitis   [x] Swelling in legs   [] Varicose veins   [] Non-healing ulcers Pulmonary:   [] Uses home oxygen   [] Productive  cough   [] Hemoptysis   [] Wheeze  [] COPD   [] Asthma Neurologic:  [] Dizziness   [] Seizures   [] History of stroke   [] History of TIA  [] Aphasia   [] Vissual changes   [] Weakness or numbness in arm   [] Weakness or numbness in leg Musculoskeletal:   [] Joint swelling   [] Joint pain   [] Low back pain Hematologic:  [] Easy bruising  [] Easy  bleeding   [] Hypercoagulable state   [] Anemic Gastrointestinal:  [] Diarrhea   [] Vomiting  [] Gastroesophageal reflux/heartburn   [] Difficulty swallowing. Genitourinary:  [] Chronic kidney disease   [] Difficult urination  [] Frequent urination   [] Blood in urine Skin:  [] Rashes   [] Ulcers  Psychological:  [] History of anxiety   []  History of major depression.  Physical Examination  There were no vitals filed for this visit. There is no height or weight on file to calculate BMI. Gen: WD/WN, NAD Head: [place]/AT, No temporalis wasting.  Ear/Nose/Throat: Hearing grossly intact, nares w/o erythema or drainage, pinna without lesions Eyes: PER, EOMI, sclera nonicteric.  Neck: Supple, no gross masses.  No JVD.  Pulmonary:  Good air movement, no audible wheezing, no use of accessory muscles.  Cardiac: RRR, precordium not hyperdynamic. Vascular: The previous wound is now completely healed.  Scattered varicosities present bilaterally.  Moderate venous stasis changes to the legs bilaterally.  2+ soft pitting edema. CEAP C4sEpAsPr   Vessel Right Left  Radial Palpable Palpable  Gastrointestinal: soft, non-distended. No guarding/no peritoneal signs.  Musculoskeletal: M/S 5/5 throughout.  No deformity.  Neurologic: CN 2-12 intact. Pain and light touch intact in extremities.  Symmetrical.  Speech is fluent. Motor exam as listed above. Psychiatric: Judgment intact, Mood & affect appropriate for pt's clinical situation. Dermatologic: Venous rashes no ulcers noted.  No changes consistent with cellulitis. Lymph : No lichenification or skin changes of chronic lymphedema.  CBC Lab Results   Component Value Date   WBC 4.9 11/03/2022   HGB 12.3 11/03/2022   HCT 37.9 11/03/2022   MCV 96.7 11/03/2022   PLT 241.0 11/03/2022    BMET    Component Value Date/Time   NA 141 05/14/2023 0821   K 4.0 05/14/2023 0821   CL 107 05/14/2023 0821   CO2 28 05/14/2023 0821   GLUCOSE 84 05/14/2023 0821   BUN 21 05/14/2023 0821   CREATININE 0.79 05/14/2023 0821   CALCIUM 9.2 05/14/2023 0821   GFRNONAA >60 05/18/2017 0442   GFRAA >60 05/18/2017 0442   CrCl cannot be calculated (Patient's most recent lab result is older than the maximum 21 days allowed.).  COAG No results found for: "INR", "PROTIME"  Radiology No results found.   Assessment/Plan 1. Venous ulcer of ankle, left (HCC) No surgery or intervention at this point in time.  Her ulcer is now healed.  I have had a long discussion with the patient regarding venous insufficiency and why it  causes symptoms, specifically venous ulceration. I have discussed with the patient the chronic skin changes that accompany venous insufficiency and the long term sequela such as infection and recurring  ulceration.  The patient will now return to wearing graduated compression on a daily basis.  In addition, behavioral modification including several periods of elevation of the lower extremities during the day will be continued. Achieving a position with the ankles at heart level was stressed to the patient  The patient is instructed to begin routine exercise, especially walking on a daily basis  In the future the patient can be assessed for graduated compression stockings or wraps as well as a Lymph Pump once the ulcers are healed.  2. Benign essential HTN Continue antihypertensive medications as already ordered, these medications have been reviewed and there are no changes at this time.  3. Gastroesophageal reflux disease, unspecified whether esophagitis present Continue PPI as already ordered, this medication has been reviewed and there  are no changes at this time.  Avoidence of caffeine and  alcohol  Moderate elevation of the head of the bed   4. Chronic venous insufficiency See #1    Levora Dredge, MD  06/09/2023 10:17 AM

## 2023-06-10 ENCOUNTER — Ambulatory Visit (INDEPENDENT_AMBULATORY_CARE_PROVIDER_SITE_OTHER): Payer: PPO | Admitting: Vascular Surgery

## 2023-06-10 ENCOUNTER — Ambulatory Visit (INDEPENDENT_AMBULATORY_CARE_PROVIDER_SITE_OTHER): Payer: PPO

## 2023-06-10 ENCOUNTER — Encounter (INDEPENDENT_AMBULATORY_CARE_PROVIDER_SITE_OTHER): Payer: Self-pay | Admitting: Vascular Surgery

## 2023-06-10 VITALS — BP 156/76 | HR 76 | Resp 18 | Ht 64.0 in | Wt 102.2 lb

## 2023-06-10 DIAGNOSIS — K219 Gastro-esophageal reflux disease without esophagitis: Secondary | ICD-10-CM | POA: Diagnosis not present

## 2023-06-10 DIAGNOSIS — I10 Essential (primary) hypertension: Secondary | ICD-10-CM | POA: Diagnosis not present

## 2023-06-10 DIAGNOSIS — I872 Venous insufficiency (chronic) (peripheral): Secondary | ICD-10-CM | POA: Diagnosis not present

## 2023-06-10 DIAGNOSIS — L97329 Non-pressure chronic ulcer of left ankle with unspecified severity: Secondary | ICD-10-CM

## 2023-06-10 DIAGNOSIS — I83023 Varicose veins of left lower extremity with ulcer of ankle: Secondary | ICD-10-CM | POA: Diagnosis not present

## 2023-06-13 ENCOUNTER — Encounter (INDEPENDENT_AMBULATORY_CARE_PROVIDER_SITE_OTHER): Payer: Self-pay | Admitting: Vascular Surgery

## 2023-06-18 DIAGNOSIS — M0609 Rheumatoid arthritis without rheumatoid factor, multiple sites: Secondary | ICD-10-CM | POA: Diagnosis not present

## 2023-06-18 DIAGNOSIS — Z79899 Other long term (current) drug therapy: Secondary | ICD-10-CM | POA: Diagnosis not present

## 2023-06-18 DIAGNOSIS — G3184 Mild cognitive impairment, so stated: Secondary | ICD-10-CM | POA: Diagnosis not present

## 2023-07-31 ENCOUNTER — Ambulatory Visit
Admission: RE | Admit: 2023-07-31 | Discharge: 2023-07-31 | Disposition: A | Discharge status: Home / Self Care | Payer: PPO | Source: Ambulatory Visit

## 2023-07-31 VITALS — BP 135/82 | HR 75 | Temp 98.1°F | Resp 16

## 2023-07-31 DIAGNOSIS — L309 Dermatitis, unspecified: Secondary | ICD-10-CM | POA: Diagnosis not present

## 2023-07-31 DIAGNOSIS — S0081XA Abrasion of other part of head, initial encounter: Secondary | ICD-10-CM | POA: Diagnosis not present

## 2023-07-31 DIAGNOSIS — L03213 Periorbital cellulitis: Secondary | ICD-10-CM | POA: Diagnosis not present

## 2023-07-31 MED ORDER — MUPIROCIN 2 % EX OINT: 1.0000 | TOPICAL_OINTMENT | Freq: Three times a day (TID) | CUTANEOUS | 0 refills | Status: DC

## 2023-07-31 MED ORDER — AMOXICILLIN-POT CLAVULANATE 875-125 MG PO TABS: 1.0000 | ORAL_TABLET | Freq: Two times a day (BID) | ORAL | 0 refills | Status: AC

## 2023-07-31 MED ORDER — PREDNISONE 20 MG PO TABS: 20.0000 mg | ORAL_TABLET | Freq: Every day | ORAL | 0 refills | Status: AC

## 2023-07-31 NOTE — ED Provider Notes (Signed)
Renaldo Fiddler    CSN: [id number] Arrival date & time: 07/31/23  1029      History   Chief Complaint Chief Complaint  Patient presents with   Fall   Abrasion    HPI Donna Rhodes is a 77 y.o. female.   Pleasant 77 year old female presents today primarily due to concerns of redness swelling and itching around her eyes bilaterally, but much more significant around her right.  She states that 4 days ago she was outside trying to pick up a bucket.  Her cat darted in front of her, and patient lost her balance.  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  She was holding a bucket in her right arm and was unable to catch herself with the left due to her CVA.  She fell onto her right elbow and subsequently hit her face on the garage floor.  She denies a headache or loss of consciousness.  She denies dizziness or tinnitus.  She states there was a hematoma above her left eyebrow, but this has seemed to move down to her nose and under her eyes bilaterally.  The reason for her visit today is primarily because her right eyelid upper and lower has become red swollen painful and itchy.  She has been trying topical Cetaphil and she also tried Pataday eyedrops without any improvement to symptoms.   Fall    Past Medical History:  Diagnosis Date   Actinic keratosis    Atrophic vaginitis    Dysplasia of vagina    vin 1 - vulva and vaginal cuff   GERD (gastroesophageal reflux disease)    HCV infection    History of basal cell carcinoma (BCC) 04/11/2019   right lat lower eyelid margin Mohs 2020 Dr. Adriana Simas   HSV (herpes simplex virus) infection    Hypercholesterolemia    Hypertension    Hypothyroidism    Left-sided weakness    S/P CVA 4/18.  PT 2x/wk.   Lung nodule    stable   Menopausal state    Osteoarthritis    hand involvement, cervical disc disease   Osteoporosis    Pelvic pain in female    Rheumatoid arthritis (HCC)     Stroke (cerebrum) (HCC) 04/24/2017   Thyroid nodule    pt unaware of this    Vaginal Pap smear, abnormal    lgsil    Patient Active Problem List   Diagnosis Date Noted   Chronic venous insufficiency 06/09/2023   Venous ulcer of ankle, left (HCC) 06/06/2023   Vitamin D deficiency 05/30/2023   Lymphedema 05/30/2023   Swelling of lower extremity 04/28/2023   Leg lesion 04/16/2023   Redness of eye, right 04/15/2023   PVC (premature ventricular contraction) 02/25/2023   Fall 06/16/2022   Head pain 06/11/2022   Hair thinning 03/27/2022   Major depressive disorder, single episode, mild (HCC) 02/01/2022   UTI (urinary tract infection) 01/29/2022   Skin lesion 11/01/2021   B12 deficiency 06/11/2021   Pelvic fullness 04/10/2021   Sore mouth 09/01/2020   Abnormal liver function test 08/22/2020   Shortness of breath 04/28/2020   Dizziness 01/27/2020   Nail abnormality 07/22/2019   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/11/2019   Complete tear of left rotator cuff 02/28/2018   Rotator cuff tendinitis, left 02/28/2018   Left arm pain 11/05/2017   Nocturia 08/13/2017   Urge incontinence 08/13/2017   Adhesive capsulitis of left shoulder 08/06/2017   Increased  frequency of urination 07/11/2017   Gait disturbance, post-stroke 05/31/2017   Adjustment disorder with mixed anxiety and depressed mood    Hypokalemia    Flaccid monoplegia of upper extremity (HCC)    Benign essential HTN    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) 04/28/2017   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HCC) 04/27/2017   History of CVA (cerebrovascular accident) 04/24/2017   Palpitations 04/23/2017   LGSIL Pap smear of vagina 12/15/2016   Anal skin tag 02/02/2016   Inflammatory arthritis 01/19/2016   Right knee pain 01/19/2016   Genital herpes 10/20/2015   GERD (gastroesophageal reflux disease) 10/20/2015   Status post vaginal hysterectomy 10/20/2015   Health care maintenance 05/30/2015   LLQ pain 03/31/2014    Menopausal symptoms 03/31/2014   Generalized abdominal pain 10/05/2013   Female stress incontinence 08/18/2013   Incomplete emptying of bladder 08/18/2013   Bladder pain 08/18/2013   Hypercholesterolemia 12/31/2012   Hypothyroidism, unspecified 12/29/2012   Osteoarthritis 12/29/2012   Benign neoplasm of mouth 09/14/2012   PULMONARY NODULE 01/02/2008   Mild depression 12/30/2007   Essential hypertension 12/30/2007   Depression 12/30/2007    Past Surgical History:  Procedure Laterality Date   ADENOIDECTOMY     APPENDECTOMY  1965   BUNIONECTOMY  1994   left   CATARACT EXTRACTION W/PHACO Right 10/27/2017   Procedure: CATARACT EXTRACTION PHACO AND INTRAOCULAR LENS PLACEMENT (IOC) RIGHT;  Surgeon: Lockie Mola, MD;  Location: [facility];  Service: Ophthalmology;  Laterality: Right;  requests early (8-8:30 arrival)   CATARACT EXTRACTION W/PHACO Left 11/10/2017   Procedure: CATARACT EXTRACTION PHACO AND INTRAOCULAR LENS PLACEMENT (IOC) LEFT;  Surgeon: Lockie Mola, MD;  Location: [facility];  Service: Ophthalmology;  Laterality: Left;   CHOLECYSTECTOMY     FINGER SURGERY  1982   laser vein surgery     LOOP RECORDER INSERTION N/A 09/24/2017   Procedure: LOOP RECORDER INSERTION;  Surgeon: Marinus Maw, MD;  Location: [facility];  Service: Cardiovascular;  Laterality: N/A;   MANDIBLE RECONSTRUCTION  1992   TOENAIL EXCISION  1998   several   TONSILLECTOMY     TUBAL LIGATION  1977   VAGINAL HYSTERECTOMY  1983   als0- anterior/posterior colporrhaphy   VAGINAL WOUND CLOSURE / REPAIR  1999, 2000   VAGINAL WOUND CLOSURE / REPAIR      OB History     Gravida  3   Para  3   Term  3   Preterm      AB      Living  3      SAB      IAB      Ectopic      Multiple      Live Births  3            Home Medications    Prior to Admission medications   Medication Sig Start Date End Date Taking? Authorizing Provider   amoxicillin-clavulanate (AUGMENTIN) 875-125 MG tablet Take 1 tablet by mouth 2 (two) times daily with a meal for 5 days. 07/31/23 08/05/23 Yes Cordarro Spinnato L, PA  mupirocin ointment (BACTROBAN) 2 % Apply 1 Application topically 3 (three) times daily. forehead 07/31/23  Yes Annalynn Centanni L, PA  predniSONE (DELTASONE) 20 MG tablet Take 1 tablet (20 mg total) by mouth daily with breakfast for 5 days. 07/31/23 08/05/23 Yes Siyah Mault L, PA  acetaminophen (TYLENOL) 500 MG tablet Take 1-2 tablets (500-1,000 mg total) by mouth every 6 (six) hours  as needed for mild pain. 10/01/20   Dale Goehner, MD  acyclovir (ZOVIRAX) 200 MG capsule Take 1 capsule (200 mg total) by mouth 2 (two) times daily. 02/10/23   Dale Amite, MD  amLODipine (NORVASC) 2.5 MG tablet Take 1 tablet (2.5 mg total) by mouth daily. 02/10/23   Dale [place], MD  atorvastatin (LIPITOR) 20 MG tablet TAKE 1 TABLET BY MOUTH DAILY 02/15/23   Dale [place], MD  calcium-vitamin D (OSCAL WITH D) 250-125 MG-UNIT tablet Take 1 tablet by mouth daily.    [provider]  Cholecalciferol (VITAMIN D3 PO) Take by mouth.    [provider]  clopidogrel (PLAVIX) 75 MG tablet Take 1 tablet (75 mg total) by mouth daily. 02/10/23   Dale [place], MD  folic acid (FOLVITE) 1 MG tablet Take 1 mg by mouth daily. 10/06/21   [provider]  ketoconazole (NIZORAL) 2 % shampoo Apply 1 Application topically 2 (two) times a week. 04/05/23   [provider]  levothyroxine (SYNTHROID) 50 MCG tablet Take 1 tablet (50 mcg total) by mouth daily before breakfast. 02/10/23   Dale [place], MD  methotrexate 2.5 MG tablet Take 10 mg by mouth once a week. 10/06/21   [provider]  vitamin B-12 (CYANOCOBALAMIN) 100 MCG tablet Take 100 mcg by mouth daily.    [provider]    Family History Family History  Problem Relation Age of Onset   Hypertension Father    Dementia Father    Osteoarthritis Mother     Arthritis/Rheumatoid Sister    Breast cancer Other        paternal side   Breast cancer Paternal Aunt    Breast cancer Cousin        maternal   Uterine cancer Maternal Grandmother    Ovarian cancer Neg Hx    Heart disease Neg Hx    Diabetes Neg Hx    Colon cancer Neg Hx     Social History Social History   Tobacco Use   Smoking status: Former    Current packs/day: 0.00    Types: Cigarettes    Quit date: 12/29/1979    Years since quitting: 43.6   Smokeless tobacco: Never  Vaping Use   Vaping status: Never Used  Substance Use Topics   Alcohol use: No    Alcohol/week: 0.0 standard drinks of alcohol   Drug use: No     Allergies   Amoxicillin, Avelox [moxifloxacin hcl in nacl], Azithromycin, Bacitracin, Codeine, Estroven weight management [nutritional supplements], Fish oil, Food, Moxifloxacin, Naproxen, Nsaids, and Cefepime   Review of Systems Review of Systems As per HPI  Physical Exam Triage Vital Signs ED Triage Vitals  Encounter Vitals Group     BP 07/31/23 1052 135/82     Systolic BP Percentile --      Diastolic BP Percentile --      Pulse Rate 07/31/23 1052 75     Resp 07/31/23 1052 16     Temp 07/31/23 1052 98.1 F (36.7 C)     Temp Source 07/31/23 1052 Temporal     SpO2 07/31/23 1052 96 %     Weight --      Height --      Head Circumference --      Peak Flow --      Pain Score 07/31/23 1055 0     Pain Loc --      Pain Education --      Exclude from Growth Chart --  No data found.  Updated Vital Signs BP 135/82 (BP Location: Left Arm)   Pulse 75   Temp 98.1 F (36.7 C) (Temporal)   Resp 16   LMP 12/29/1981   SpO2 96%   Visual Acuity Right Eye Distance:   Left Eye Distance:   Bilateral Distance:    Right Eye Near:   Left Eye Near:    Bilateral Near:     Physical Exam Vitals and nursing note reviewed.  Constitutional:      General: She is not in acute distress.    Appearance: Normal appearance. She is not ill-appearing.  HENT:      Head: Raccoon eyes, abrasion and right periorbital erythema present. No Battle's sign, left periorbital erythema or laceration.     Jaw: There is normal jaw occlusion. No trismus, tenderness, swelling or pain on movement.     Comments: Evidence of head trauma, see photos    Right Ear: No tenderness. No middle ear effusion. No hemotympanum. Tympanic membrane is not injected, scarred or perforated.     Left Ear: No tenderness.  No middle ear effusion. No hemotympanum. Tympanic membrane is not injected, scarred or perforated.     Nose: Signs of injury present. No nasal deformity or nasal tenderness.     Right Nostril: No epistaxis or septal hematoma.     Left Nostril: No epistaxis or septal hematoma.     Right Sinus: No maxillary sinus tenderness or frontal sinus tenderness.     Left Sinus: No maxillary sinus tenderness or frontal sinus tenderness.  Eyes:     General: Vision grossly intact. Gaze aligned appropriately.     Extraocular Movements: Extraocular movements intact.     Right eye: Normal extraocular motion and no nystagmus.     Left eye: Normal extraocular motion and no nystagmus.     Conjunctiva/sclera:     Right eye: Right conjunctiva is not injected. No hemorrhage.    Left eye: Left conjunctiva is not injected. No hemorrhage.    Comments: R upper eyelid edematous, erythematous, minimally warm to touch  Cardiovascular:     Rate and Rhythm: Normal rate.  Pulmonary:     Effort: Pulmonary effort is normal. No respiratory distress.  Musculoskeletal:     Cervical back: Normal range of motion and neck supple. No rigidity or tenderness.  Lymphadenopathy:     Cervical: No cervical adenopathy.  Skin:    General: Skin is warm.     Findings: Bruising (B maxillary region, nasal bridge) present.  Neurological:     Mental Status: She is alert and oriented to person, place, and time. Mental status is at baseline.     Cranial Nerves: No cranial nerve deficit.     Motor: Weakness (chronic LUE  and LLE weakness) present.  Psychiatric:        Mood and Affect: Mood normal.        Behavior: Behavior normal.         UC Treatments / Results  Labs (all labs ordered are listed, but only abnormal results are displayed) Labs Reviewed - No data to display  EKG   Radiology No results found.  Procedures Procedures (including critical care time)  Medications Ordered in UC Medications - No data to display  Initial Impression / Assessment and Plan / UC Course  I have reviewed the triage vital signs and the nursing notes.  Pertinent labs & imaging results that were available during my care of the patient were reviewed by me and  considered in my medical decision making (see chart for details).     Abrasion to forehead -patient reports the only impact of her head was to the forehead at the location of her abrasion.  The bruising around her eyes appears to be due to gravitational pull.  She has no concerning signs or symptoms of basilar skull fracture, orbital fracture, or additional head injury.  Abrasion appears to be healing well, but will give mupirocin ointment to help prevent infection. Periorbital dermatitis -most prominent on the right upper eyelid.  Patient does have extensive ocular history, will therefore avoid steroids around the eye.  Will give her a p.o. steroid for the next 5 days. Preseptal cellulitis right eye -patient has numerous antibiotic allergies listed in the chart.  We discussed her penicillin allergy in depth, patient states in the past it is caused "weird mouth issues", but she states she has "outgrown them".  Patient states tolerance to penicillin products and thus feels Augmentin is appropriate to use.  Will give this to her twice daily for the next 5 days to help with the concerning developing preseptal cellulitis.  L hemiparesis -stable since patient's stroke. Fall -patient states overall she feels very well.  Denies any headache.  No loss of  consciousness.  Patient had a normal neurological exam.  We did discuss possible CT scan, however pt hesitant as she states she feels at her baseline and only came in today because of the itching and irritation to her right eyelid.  I feel this is acceptable as the bruising around her eyes patient states has actually drained from her left forehead. We did discuss ER precautions that would warrant further eval.  Final Clinical Impressions(s) / UC Diagnoses   Final diagnoses:  Abrasion of forehead, initial encounter  Periorbital dermatitis  Preseptal cellulitis of right eye     Discharge Instructions      Please apply the mupirocin ointment to the area of your forehead 3 times daily. You may also purchase Aveeno facial lotion.  Please take the Augmentin 1 tablet twice daily with food for 5 days.  Please take 1 tablet of prednisone daily.  This is best taken with breakfast, or lunch.  Taking at suppertime can cause insomnia or trouble sleeping.  Please call your primary care physician and schedule a follow-up within the next 7 days.  If you develop a headache, fever, or any new symptoms, please head to the emergency room.     ED Prescriptions     Medication Sig Dispense Auth. Provider   predniSONE (DELTASONE) 20 MG tablet Take 1 tablet (20 mg total) by mouth daily with breakfast for 5 days. 5 tablet Kasper Mudrick L, PA   mupirocin ointment (BACTROBAN) 2 % Apply 1 Application topically 3 (three) times daily. forehead 22 g Teven Mittman L, PA   amoxicillin-clavulanate (AUGMENTIN) 875-125 MG tablet Take 1 tablet by mouth 2 (two) times daily with a meal for 5 days. 10 tablet Falesha Schommer L, [place]      PDMP not reviewed this encounter.   Maretta Bees, [place] 08/01/23 1642

## 2023-07-31 NOTE — ED Triage Notes (Signed)
Patient presents to UC for a fall x 4 days ago. States she lost her balance and fell on her concrete garage. She landed on her right elbow and face. She does take plavix. Treating abrasions with an OTC cream.   Denies LOC.

## 2023-07-31 NOTE — Discharge Instructions (Addendum)
Please apply the mupirocin ointment to the area of your forehead 3 times daily. You may also purchase Aveeno facial lotion.  Please take the Augmentin 1 tablet twice daily with food for 5 days.  Please take 1 tablet of prednisone daily.  This is best taken with breakfast, or lunch.  Taking at suppertime can cause insomnia or trouble sleeping.  Please call your primary care physician and schedule a follow-up within the next 7 days.  If you develop a headache, fever, or any new symptoms, please head to the emergency room.

## 2023-08-17 ENCOUNTER — Telehealth: Payer: Self-pay | Admitting: Internal Medicine

## 2023-08-17 NOTE — Telephone Encounter (Signed)
Access Nurse recommends she go to ED.  Patient declined ED.  Please call patient to follow-up.

## 2023-08-17 NOTE — Telephone Encounter (Signed)
Called pt while she was talking with access nurse, will await report before calling pt back

## 2023-08-17 NOTE — Telephone Encounter (Signed)
  Symptoms: No headace or dizzie. A little light headed.     Attributing factors (medication changes, positional changes, etc. ) Wants to see if anyone can let her know what to do.      Duration :  A week ago today. Fell into a cabinet near the steps.   Pain Scale?  On 1-10 how woiuld you rate your pain? What makes it better or worse?  No pain. A big knot    Blood pressure            Pulse             Temp

## 2023-08-17 NOTE — Telephone Encounter (Signed)
Spoke with pt and scheduled her for tomorrow at [facility] since she was still refusing the ED.

## 2023-08-18 ENCOUNTER — Encounter: Payer: Self-pay | Admitting: Family Medicine

## 2023-08-18 ENCOUNTER — Ambulatory Visit (INDEPENDENT_AMBULATORY_CARE_PROVIDER_SITE_OTHER): Payer: PPO | Admitting: Family Medicine

## 2023-08-18 VITALS — BP 130/76 | HR 84 | Temp 98.2°F | Ht 64.0 in | Wt 101.2 lb

## 2023-08-18 DIAGNOSIS — S41112A Laceration without foreign body of left upper arm, initial encounter: Secondary | ICD-10-CM | POA: Insufficient documentation

## 2023-08-18 DIAGNOSIS — T148XXA Other injury of unspecified body region, initial encounter: Secondary | ICD-10-CM | POA: Diagnosis not present

## 2023-08-18 DIAGNOSIS — S0990XA Unspecified injury of head, initial encounter: Secondary | ICD-10-CM | POA: Insufficient documentation

## 2023-08-18 DIAGNOSIS — W19XXXA Unspecified fall, initial encounter: Secondary | ICD-10-CM | POA: Diagnosis not present

## 2023-08-18 NOTE — Assessment & Plan Note (Signed)
From fall a week ago  No complication  Pt declines tetanus update- wants to d/w pcp  Encouraged wound care-see AVS Has bactroban ointment  Loosely cover  Update if not starting to improve in a week or if worsening  Call back and Er precautions noted in detail today

## 2023-08-18 NOTE — Progress Notes (Signed)
Subjective:    Patient ID: Donna Rhodes, female    DOB: July 11, 1946, 77 y.o.   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    This is a new fall - a week ago yesterday  In garage in bedroom slippers / coming up 3 steps and slipped  Her head hit a cabinet on the left (corner of cabinet) and fell on her left side (hip/side)  Had abrasion on arm-band aid Has a bit goose egg on left side of head  She called re injuries and refused to go to the ER   Knot on her head hurt initially but not now  Some bruising   Bactroban on the skin tears   No headache No nausea or vomiting  No confusion or personality change  No new dizziness  No fever No neck pain    Unsure of tetanus shot   [facility]     She takes plavix   Was seen in ER on 8/3 for a fall also  Had fallen onto her right elbow and hit face on garage floor  Had hematoma above left eyebrow that extended around eyes bilaterally , also abrasion left forehead  Also had some periorbital dermatitis (po steroid) , and preseptal cellulitis right eye treated with augmentin   She did follow up at [facility] and all was fine        Lab Results  Component Value Date   WBC 4.9 11/03/2022   HGB 12.3 11/03/2022   HCT 37.9 11/03/2022   MCV 96.7 11/03/2022   PLT 241.0 11/03/2022     Patient Active Problem List   Diagnosis Date Noted   Head injury 08/18/2023   Hematoma 08/18/2023   Skin tear of left upper arm without complication 08/18/2023   Chronic venous  insufficiency 06/09/2023   Venous ulcer of ankle, left (HCC) 06/06/2023   Vitamin D deficiency 05/30/2023   Lymphedema 05/30/2023   Swelling of lower extremity 04/28/2023   Leg lesion 04/16/2023   Redness of eye, right 04/15/2023   PVC (premature ventricular contraction) 02/25/2023   Fall 06/16/2022   Head pain 06/11/2022   Hair thinning 03/27/2022   Major depressive disorder, single episode, mild (HCC) 02/01/2022   UTI (urinary tract infection) 01/29/2022   Skin lesion 11/01/2021   B12 deficiency 06/11/2021   Pelvic fullness 04/10/2021   Sore mouth 09/01/2020   Abnormal liver function test 08/22/2020   Shortness of breath 04/28/2020   Dizziness 01/27/2020   Nail abnormality 07/22/2019   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/11/2019   Complete tear of left rotator cuff 02/28/2018   Rotator cuff tendinitis, left 02/28/2018   Left arm pain 11/05/2017   Nocturia 08/13/2017   Urge incontinence 08/13/2017   Adhesive capsulitis of left shoulder 08/06/2017   Increased frequency of urination 07/11/2017   Gait disturbance, post-stroke 05/31/2017   Adjustment disorder with mixed anxiety and depressed mood  Hypokalemia    Flaccid monoplegia of upper extremity (HCC)    Benign essential HTN    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) 04/28/2017   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HCC) 04/27/2017   History of CVA (cerebrovascular accident) 04/24/2017   Palpitations 04/23/2017   LGSIL Pap smear of vagina 12/15/2016   Anal skin tag 02/02/2016   Inflammatory arthritis 01/19/2016   Right knee pain 01/19/2016   Genital herpes 10/20/2015   GERD (gastroesophageal reflux disease) 10/20/2015   Status post vaginal hysterectomy 10/20/2015   Health care maintenance 05/30/2015   LLQ pain 03/31/2014   Menopausal symptoms 03/31/2014   Generalized abdominal pain 10/05/2013   Female stress incontinence 08/18/2013   Incomplete emptying of bladder 08/18/2013   Bladder pain 08/18/2013    Hypercholesterolemia 12/31/2012   Hypothyroidism, unspecified 12/29/2012   Osteoarthritis 12/29/2012   Benign neoplasm of mouth 09/14/2012   PULMONARY NODULE 01/02/2008   Mild depression 12/30/2007   Essential hypertension 12/30/2007   Depression 12/30/2007   Past Medical History:  Diagnosis Date   Actinic keratosis    Atrophic vaginitis    Dysplasia of vagina    vin 1 - vulva and vaginal cuff   GERD (gastroesophageal reflux disease)    HCV infection    History of basal cell carcinoma (BCC) 04/11/2019   right lat lower eyelid margin Mohs 2020 Dr. Adriana Simas   HSV (herpes simplex virus) infection    Hypercholesterolemia    Hypertension    Hypothyroidism    Left-sided weakness    S/P CVA 4/18.  PT 2x/wk.   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   Thyroid nodule    pt unaware of this    Vaginal Pap smear, abnormal    lgsil   Past Surgical History:  Procedure Laterality Date   ADENOIDECTOMY     APPENDECTOMY  1965   BUNIONECTOMY  1994   left   CATARACT EXTRACTION W/PHACO Right 10/27/2017   Procedure: CATARACT EXTRACTION PHACO AND INTRAOCULAR LENS PLACEMENT (IOC) RIGHT;  Surgeon: Lockie Mola, MD;  Location: [facility];  Service: Ophthalmology;  Laterality: Right;  requests early (8-8:30 arrival)   CATARACT EXTRACTION W/PHACO Left 11/10/2017   Procedure: CATARACT EXTRACTION PHACO AND INTRAOCULAR LENS PLACEMENT (IOC) LEFT;  Surgeon: Lockie Mola, MD;  Location: [facility] [place] [facility];  Service: Ophthalmology;  Laterality: Left;   CHOLECYSTECTOMY     FINGER SURGERY  1982   laser vein surgery     LOOP RECORDER INSERTION N/A 09/24/2017   Procedure: LOOP RECORDER INSERTION;  Surgeon: Marinus Maw, MD;  Location: [facility];  Service: Cardiovascular;  Laterality: N/A;   MANDIBLE  RECONSTRUCTION  1992   TOENAIL EXCISION  1998   several   TONSILLECTOMY     TUBAL LIGATION  1977   VAGINAL HYSTERECTOMY  1983   als0- anterior/posterior colporrhaphy   VAGINAL WOUND CLOSURE / REPAIR  1999, 2000   VAGINAL WOUND CLOSURE / REPAIR     Social History   Tobacco Use   Smoking status: Former    Current packs/day: 0.00    Types: Cigarettes    Quit date: 12/29/1979    Years since quitting: 43.6   Smokeless tobacco: Never  Vaping Use   Vaping status: Never Used  Substance Use Topics  Alcohol use: No    Alcohol/week: 0.0 standard drinks of alcohol   Drug use: No   Family History  Problem Relation Age of Onset   Hypertension Father    Dementia Father    Osteoarthritis Mother    Arthritis/Rheumatoid Sister    Breast cancer Other        paternal side   Breast cancer Paternal Aunt    Breast cancer Cousin        maternal   Uterine cancer Maternal Grandmother    Ovarian cancer Neg Hx    Heart disease Neg Hx    Diabetes Neg Hx    Colon cancer Neg Hx    Allergies  Allergen Reactions   Amoxicillin Other (See Comments)    Has patient had a PCN reaction causing immediate rash, facial/tongue/throat swelling, SOB or lightheadedness with hypotension: No Has patient had a PCN reaction causing severe rash involving mucus membranes or skin necrosis: No Has patient had a PCN reaction that required hospitalization: No Has patient had a PCN reaction occurring within the last 10 years: No If all of the above answers are "NO", then may proceed with Cephalosporin use.   Irritated tongue   Avelox [Moxifloxacin Hcl In Nacl]     Tongue irritation   Azithromycin Other (See Comments)    Caused mouth and tongue to be sore   Bacitracin     Itching and rash    Codeine     "feels like I'm in a barrel"   [employer] Weight Management [Nutritional Supplements]     Mouth soreness   Fish Oil     Other reaction(s): Other (See Comments) Mouth soreness Mouth soreness   Food     Walnuts  --mouth breaks out   Moxifloxacin Other (See Comments)    Tongue irritation   Naproxen     Tongue irritation   Nsaids Other (See Comments)    Oral inflammation   Cefepime Rash   Current Outpatient Medications on File Prior to Visit  Medication Sig Dispense Refill   acetaminophen (TYLENOL) 500 MG tablet Take 1-2 tablets (500-1,000 mg total) by mouth every 6 (six) hours as needed for mild pain. 30 tablet 0   acyclovir (ZOVIRAX) 200 MG capsule Take 1 capsule (200 mg total) by mouth 2 (two) times daily. 180 capsule 3   amLODipine (NORVASC) 2.5 MG tablet Take 1 tablet (2.5 mg total) by mouth daily. 90 tablet 1   atorvastatin (LIPITOR) 20 MG tablet TAKE 1 TABLET BY MOUTH DAILY 90 tablet 0   calcium-vitamin D (OSCAL WITH D) 250-125 MG-UNIT tablet Take 1 tablet by mouth daily.     Cholecalciferol (VITAMIN D3 PO) Take by mouth.     clopidogrel (PLAVIX) 75 MG tablet Take 1 tablet (75 mg total) by mouth daily. 90 tablet 1   folic acid (FOLVITE) 1 MG tablet Take 1 mg by mouth daily.     ketoconazole (NIZORAL) 2 % shampoo Apply 1 Application topically 2 (two) times a week.     levothyroxine (SYNTHROID) 50 MCG tablet Take 1 tablet (50 mcg total) by mouth daily before breakfast. 90 tablet 1   methotrexate 2.5 MG tablet Take 10 mg by mouth once a week.     vitamin B-12 (CYANOCOBALAMIN) 100 MCG tablet Take 100 mcg by mouth daily.     No current facility-administered medications on file prior to visit.    Review of Systems  Constitutional:  Negative for activity change, appetite change, fatigue, fever and unexpected weight change.  HENT:  Negative for congestion, ear pain, rhinorrhea, sinus pressure and sore throat.   Eyes:  Negative for pain, redness and visual disturbance.  Respiratory:  Negative for cough, shortness of breath and wheezing.   Cardiovascular:  Negative for chest pain and palpitations.  Gastrointestinal:  Negative for abdominal pain, blood in stool, constipation and diarrhea.   Endocrine: Negative for polydipsia and polyuria.  Genitourinary:  Negative for dysuria, frequency and urgency.  Musculoskeletal:  Negative for arthralgias, back pain and myalgias.  Skin:  Positive for wound. Negative for pallor and rash.  Allergic/Immunologic: Negative for environmental allergies.  Neurological:  Positive for weakness. Negative for dizziness, tremors, seizures, syncope, facial asymmetry, speech difficulty, light-headedness, numbness and headaches.       Has intermittent vestibular dizziness None today  Baseline left arm weakness-no change   Hematological:  Negative for adenopathy. Bruises/bleeds easily.  Psychiatric/Behavioral:  Negative for decreased concentration and dysphoric mood. The patient is not nervous/anxious.        Objective:   Physical Exam Constitutional:      General: She is not in acute distress.    Appearance: Normal appearance. She is not ill-appearing or diaphoretic.     Comments: Underweight  Frail appearing   HENT:     Head: Normocephalic.     Comments: 2 by 2.5 oval soft hematoma on left forehead  Ecchymosis surrounding this and left eye - yellow and blue in color  No swelling aside from hematoma  Minimal tenderness No step off or bony crepitus   No hemotympanum      Right Ear: Tympanic membrane and ear canal normal.     Left Ear: Tympanic membrane and ear canal normal.     Ears:     Comments: Some cerumen bilaterally    Nose: Nose normal.     Mouth/Throat:     Mouth: Mucous membranes are moist.     Pharynx: Oropharynx is clear.  Eyes:     General: No scleral icterus.       Right eye: No discharge.        Left eye: No discharge.     Conjunctiva/sclera: Conjunctivae normal.     Pupils: Pupils are equal, round, and reactive to light.  Cardiovascular:     Rate and Rhythm: Normal rate and regular rhythm.     Heart sounds: Normal heart sounds.  Pulmonary:     Effort: Pulmonary effort is normal. No respiratory distress.     Breath  sounds: Normal breath sounds. No wheezing or rales.  Musculoskeletal:     Cervical back: Neck supple. No tenderness.  Lymphadenopathy:     Cervical: No cervical adenopathy.  Skin:    General: Skin is warm and dry.     Coloration: Skin is not jaundiced.     Findings: No bruising or rash.     Comments: Small skin tear times 2 on left upper arm  No signs of infection  No bleeding or drainage   Healing scab lateral left ankle   Neurological:     Mental Status: She is alert.     Cranial Nerves: No cranial nerve deficit.     Sensory: No sensory deficit.     Motor: Weakness present.     Deep Tendon Reflexes: Reflexes normal.     Comments: Left arm is weak (baseline)  Gait is shuffling and wide based   CN intact bilaterally    Psychiatric:        Mood and Affect: Mood is anxious.  Assessment & Plan:   Problem List Items Addressed This Visit       Musculoskeletal and Integument   Skin tear of left upper arm without complication    From fall a week ago  No complication  Pt declines tetanus update- wants to d/w pcp  Encouraged wound care-see AVS Has bactroban ointment  Loosely cover  Update if not starting to improve in a week or if worsening  Call back and Er precautions noted in detail today          Other   Fall    Another fall at home coming in from garage  Reviewed UC notes from early this month (separate fall)  Hematoma and skin tear w/o concussion symptoms   Needs rail on right for stairs  Needs to stop wearing slippers  Cannot use walker due to left arm weakness Has done PT Discussed fall prevention in detail       Head injury - Primary    2nd head injury in a month  Reviewed last UC notes   This was a week ago Refused ER several times  Hit left forehead on corner of cabinet  Moderate size hematoma and bruising (plavix) No concussion symptoms  Discussed signs and symptoms to watch for and ER precautions Will continue cool  compresses Tylenol prn  See AVS Update if not starting to improve in a week or if worsening  Will plan pcp follow up  Call back and Er precautions noted in detail today         Hematoma    Soft hematoma left forehead  From fall a week ago  Fairly non tender  Some ecchymosis as well   No signs and symptoms of concussion or subdural  Call back and Er precautions noted in detail today   Instructed to use cool compress  Follow up with pcp  This may take weeks to improve

## 2023-08-18 NOTE — Patient Instructions (Signed)
We need to watch for concussion symptoms   Headache  Vision change  Dizziness  Nausea or vomiting  Personality change Confusion   Let us know if any of these symptoms occur (even if a month or more later)   If severe - call 911   Use cool compress on the sore area for 10 minutes at a time for swelling and pain   Avoid falls in every way possible  Get a railing for those stairs if you have to use them   Tylenol with careful use = not more than recommended   Follow up with Dr Lorin Picket for a re check   For skin tear on the arm - keep clean with soap and water  You can use antibiotic ointment (like bactroban)  Keep loosely covered and protected from dirt

## 2023-08-18 NOTE — Assessment & Plan Note (Signed)
Soft hematoma left forehead  From fall a week ago  Fairly non tender  Some ecchymosis as well   No signs and symptoms of concussion or subdural  Call back and Er precautions noted in detail today   Instructed to use cool compress  Follow up with pcp  This may take weeks to improve

## 2023-08-18 NOTE — Assessment & Plan Note (Signed)
Another fall at home coming in from garage  Reviewed UC notes from early this month (separate fall)  Hematoma and skin tear w/o concussion symptoms   Needs rail on right for stairs  Needs to stop wearing slippers  Cannot use walker due to left arm weakness Has done PT Discussed fall prevention in detail

## 2023-08-18 NOTE — Assessment & Plan Note (Signed)
2nd head injury in a month  Reviewed last UC notes   This was a week ago Refused ER several times  Hit left forehead on corner of cabinet  Moderate size hematoma and bruising (plavix) No concussion symptoms  Discussed signs and symptoms to watch for and ER precautions Will continue cool compresses Tylenol prn  See AVS Update if not starting to improve in a week or if worsening  Will plan pcp follow up  Call back and Er precautions noted in detail today

## 2023-08-26 DIAGNOSIS — Z961 Presence of intraocular lens: Secondary | ICD-10-CM | POA: Diagnosis not present

## 2023-08-26 DIAGNOSIS — T148XXA Other injury of unspecified body region, initial encounter: Secondary | ICD-10-CM | POA: Diagnosis not present

## 2023-08-26 DIAGNOSIS — H43813 Vitreous degeneration, bilateral: Secondary | ICD-10-CM | POA: Diagnosis not present

## 2023-09-03 ENCOUNTER — Ambulatory Visit: Payer: PPO | Admitting: Internal Medicine

## 2023-09-16 ENCOUNTER — Other Ambulatory Visit: Payer: PPO

## 2023-09-21 ENCOUNTER — Encounter: Payer: PPO | Admitting: Internal Medicine

## 2023-09-24 ENCOUNTER — Ambulatory Visit: Payer: PPO | Admitting: Internal Medicine

## 2023-10-11 ENCOUNTER — Other Ambulatory Visit: Payer: Self-pay | Admitting: Internal Medicine

## 2023-10-20 ENCOUNTER — Other Ambulatory Visit: Payer: Self-pay | Admitting: Internal Medicine

## 2023-10-20 ENCOUNTER — Other Ambulatory Visit (INDEPENDENT_AMBULATORY_CARE_PROVIDER_SITE_OTHER): Payer: PPO

## 2023-10-20 DIAGNOSIS — E7800 Pure hypercholesterolemia, unspecified: Secondary | ICD-10-CM

## 2023-10-20 DIAGNOSIS — E039 Hypothyroidism, unspecified: Secondary | ICD-10-CM | POA: Diagnosis not present

## 2023-10-20 DIAGNOSIS — E538 Deficiency of other specified B group vitamins: Secondary | ICD-10-CM | POA: Diagnosis not present

## 2023-10-20 DIAGNOSIS — I10 Essential (primary) hypertension: Secondary | ICD-10-CM | POA: Diagnosis not present

## 2023-10-20 DIAGNOSIS — E559 Vitamin D deficiency, unspecified: Secondary | ICD-10-CM

## 2023-10-20 DIAGNOSIS — D649 Anemia, unspecified: Secondary | ICD-10-CM

## 2023-10-20 LAB — BASIC METABOLIC PANEL
BUN: 17 mg/dL (ref 6–23)
CO2: 28 meq/L (ref 19–32)
Calcium: 9.3 mg/dL (ref 8.4–10.5)
Chloride: 108 meq/L (ref 96–112)
Creatinine, Ser: 0.62 mg/dL (ref 0.40–1.20)
GFR: 85.58 mL/min (ref >60.00)
Glucose, Bld: 91 mg/dL (ref 70–99)
Potassium: 4.8 meq/L (ref 3.5–5.1)
Sodium: 141 meq/L (ref 135–145)

## 2023-10-20 LAB — CBC WITH DIFFERENTIAL/PLATELET
Basophils Absolute: 0.1 10*3/uL (ref 0.0–0.1)
Basophils Relative: 2 % (ref 0.0–3.0)
Eosinophils Absolute: 0.2 10*3/uL (ref 0.0–0.7)
Eosinophils Relative: 3.5 % (ref 0.0–5.0)
HCT: 36.2 % (ref 36.0–46.0)
Hemoglobin: 11.7 g/dL — ABNORMAL LOW (ref 12.0–15.0)
Lymphocytes Relative: 27.2 % (ref 12.0–46.0)
Lymphs Abs: 1.2 10*3/uL (ref 0.7–4.0)
MCHC: 32.3 g/dL (ref 30.0–36.0)
MCV: 96.3 fL (ref 78.0–100.0)
Monocytes Absolute: 0.5 10*3/uL (ref 0.1–1.0)
Monocytes Relative: 11.4 % (ref 3.0–12.0)
Neutro Abs: 2.5 10*3/uL (ref 1.4–7.7)
Neutrophils Relative %: 55.9 % (ref 43.0–77.0)
Platelets: 269 10*3/uL (ref 150.0–400.0)
RBC: 3.76 Mil/uL — ABNORMAL LOW (ref 3.87–5.11)
RDW: 14.7 % (ref 11.5–15.5)
WBC: 4.4 10*3/uL (ref 4.0–10.5)

## 2023-10-20 LAB — LIPID PANEL
Cholesterol: 150 mg/dL (ref 0–200)
HDL: 63.6 mg/dL (ref >39.00)
LDL Cholesterol: 77 mg/dL (ref 0–99)
NonHDL: 86.07
Total CHOL/HDL Ratio: 2
Triglycerides: 45 mg/dL (ref 0.0–149.0)
VLDL: 9 mg/dL (ref 0.0–40.0)

## 2023-10-20 LAB — HEPATIC FUNCTION PANEL
ALT: 12 U/L (ref 0–35)
AST: 19 U/L (ref 0–37)
Albumin: 4.1 g/dL (ref 3.5–5.2)
Alkaline Phosphatase: 86 U/L (ref 39–117)
Bilirubin, Direct: 0.2 mg/dL (ref 0.0–0.3)
Total Bilirubin: 0.6 mg/dL (ref 0.2–1.2)
Total Protein: 6.5 g/dL (ref 6.0–8.3)

## 2023-10-20 LAB — TSH: TSH: 1.18 u[IU]/mL (ref 0.35–5.50)

## 2023-10-20 LAB — VITAMIN D 25 HYDROXY (VIT D DEFICIENCY, FRACTURES): VITD: 35.91 ng/mL (ref 30.00–100.00)

## 2023-10-20 LAB — VITAMIN B12: Vitamin B-12: 841 pg/mL (ref 211–911)

## 2023-10-20 NOTE — Progress Notes (Signed)
Order placed for add on lab.  °

## 2023-10-21 ENCOUNTER — Ambulatory Visit (INDEPENDENT_AMBULATORY_CARE_PROVIDER_SITE_OTHER): Payer: PPO

## 2023-10-21 DIAGNOSIS — D649 Anemia, unspecified: Secondary | ICD-10-CM

## 2023-10-21 LAB — IBC + FERRITIN
Ferritin: 65.4 ng/mL (ref 10.0–291.0)
Iron: 38 ug/dL — ABNORMAL LOW (ref 42–145)
Saturation Ratios: 12.5 % — ABNORMAL LOW (ref 20.0–50.0)
TIBC: 303.8 ug/dL (ref 250.0–450.0)
Transferrin: 217 mg/dL (ref 212.0–360.0)

## 2023-10-22 ENCOUNTER — Ambulatory Visit (INDEPENDENT_AMBULATORY_CARE_PROVIDER_SITE_OTHER): Payer: PPO

## 2023-10-22 ENCOUNTER — Ambulatory Visit (INDEPENDENT_AMBULATORY_CARE_PROVIDER_SITE_OTHER): Payer: PPO | Admitting: Internal Medicine

## 2023-10-22 VITALS — BP 128/70 | HR 79 | Temp 98.0°F | Resp 16 | Ht 64.0 in | Wt 101.0 lb

## 2023-10-22 DIAGNOSIS — R053 Chronic cough: Secondary | ICD-10-CM

## 2023-10-22 DIAGNOSIS — R002 Palpitations: Secondary | ICD-10-CM

## 2023-10-22 DIAGNOSIS — Z23 Encounter for immunization: Secondary | ICD-10-CM | POA: Diagnosis not present

## 2023-10-22 DIAGNOSIS — Z0000 Encounter for general adult medical examination without abnormal findings: Secondary | ICD-10-CM

## 2023-10-22 DIAGNOSIS — R296 Repeated falls: Secondary | ICD-10-CM | POA: Diagnosis not present

## 2023-10-22 DIAGNOSIS — M0600 Rheumatoid arthritis without rheumatoid factor, unspecified site: Secondary | ICD-10-CM

## 2023-10-22 DIAGNOSIS — F320 Major depressive disorder, single episode, mild: Secondary | ICD-10-CM | POA: Diagnosis not present

## 2023-10-22 NOTE — Assessment & Plan Note (Signed)
Scheduled for a physical today 10/22/23.  PAP previously through gyn.  Mammogram 02/02/23 - birads I.  Colonoscopy 09/2013 - tortuous colon.  Recommended f/u in 10 years.

## 2023-10-22 NOTE — Progress Notes (Unsigned)
c  Subjective:    Patient ID: Donna Rhodes, female    DOB: 1946/08/06, 77 y.o.   MRN: [id number]  Patient here for  Chief Complaint  Patient presents with   Annual Exam    HPI Here for a physical exam. Seen at urgent care - s/p fall - cat darted in front of her and she lost her balance. Fell onto her right elbow and hit her face on the garage floor. Bruised around left eye. Diagnosed with preseptal cellulitis right eye - treated with augmentin. Had another fall 08/11/23 - evaluated by Dr Milinda Antis - acute visit. Was wearing bedroom slippers and slipped on stairs. Hit left side head - cabinet.  Had moderate size hematoma and bruising. Recommended cool compresses and tylenol. Discussed due tetanus vaccine.  No headache.  No dizziness.  Persistent left side changes from previous CVA.  No chest pain.  Breathing overall stable.  Does report persistent cough.  Worse at night. Some runny nose and some drainage.  No sob.  No abdominal pain.  Not eating large amounts. Had f/u with Dr Allena Katz - RA - controlled. Continue methotrexate. 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 Spells of disequilibrium without dizziness, photophobia, nausea, episodes can last a full day. Negative MRI 04/25/21, negative CT 06/11/22, negative evaluation by ENT. 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 Spells of disequilibrium without dizziness, photophobia, nausea, episodes can last a full day. Negative MRI 04/25/21, negative CT 06/11/22, negative evaluation by ENT.    Past Medical History:  Diagnosis Date   Actinic keratosis    Atrophic vaginitis    Dysplasia of vagina    vin 1 - vulva and vaginal cuff   GERD (gastroesophageal reflux disease)    HCV infection    History of basal cell carcinoma (BCC) 04/11/2019   right lat lower eyelid margin Mohs 2020 Dr. Adriana Simas   HSV (herpes simplex virus) infection    Hypercholesterolemia    Hypertension    Hypothyroidism    Left-sided weakness    S/P CVA 4/18.  PT  2x/wk.   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   Thyroid nodule    pt unaware of this    Vaginal Pap smear, abnormal    lgsil   Past Surgical History:  Procedure Laterality Date   ADENOIDECTOMY     APPENDECTOMY  1965   BUNIONECTOMY  1994   left   CATARACT EXTRACTION W/PHACO Right 10/27/2017   Procedure: CATARACT EXTRACTION PHACO AND INTRAOCULAR LENS PLACEMENT (IOC) RIGHT;  Surgeon: Lockie Mola, MD;  Location: [facility];  Service: Ophthalmology;  Laterality: Right;  requests early (8-8:30 arrival)   CATARACT EXTRACTION W/PHACO Left 11/10/2017   Procedure: CATARACT EXTRACTION PHACO AND INTRAOCULAR LENS PLACEMENT (IOC) LEFT;  Surgeon: Lockie Mola, MD;  Location: [facility];  Service: Ophthalmology;  Laterality: Left;   CHOLECYSTECTOMY     FINGER SURGERY  1982   laser vein surgery     LOOP RECORDER INSERTION N/A 09/24/2017   Procedure: LOOP RECORDER INSERTION;  Surgeon: Marinus Maw, MD;  Location: [facility];  Service: Cardiovascular;  Laterality: N/A;   MANDIBLE RECONSTRUCTION  1992   TOENAIL EXCISION  1998   several   TONSILLECTOMY     TUBAL  LIGATION  1977   VAGINAL HYSTERECTOMY  1983   als0- anterior/posterior colporrhaphy   VAGINAL WOUND CLOSURE / REPAIR  1999, 2000   VAGINAL WOUND CLOSURE / REPAIR     Family History  Problem Relation Age of Onset   Hypertension Father    Dementia Father    Osteoarthritis Mother    Arthritis/Rheumatoid Sister    Breast cancer Other        paternal side   Breast cancer Paternal Aunt    Breast cancer Cousin        maternal   Uterine cancer Maternal Grandmother    Ovarian cancer Neg Hx    Heart disease Neg Hx    Diabetes Neg Hx    Colon cancer Neg Hx    Social History   Socioeconomic History   Marital status:  Divorced    Spouse name: Not on file   Number of children: 3   Years of education: 12   Highest education level: Not on file  Occupational History    Comment: retired  Tobacco Use   Smoking status: Former    Current packs/day: 0.00    Types: Cigarettes    Quit date: 12/29/1979    Years since quitting: 43.8   Smokeless tobacco: Never  Vaping Use   Vaping status: Never Used  Substance and Sexual Activity   Alcohol use: No    Alcohol/week: 0.0 standard drinks of alcohol   Drug use: No   Sexual activity: Not Currently    Birth control/protection: Surgical  Other Topics Concern   Not on file  Social History Narrative   Her mother and her daughter live with her   Caffeine- 1 cup coffee   Social Determinants of Health   Financial Resource Strain: Low Risk  (02/24/2021)   Overall Financial Resource Strain (CARDIA)    Difficulty of Paying Living Expenses: Not hard at all  Food Insecurity: No Food Insecurity (02/24/2021)   Hunger Vital Sign    Worried About Running Out of Food in the Last Year: Never true    Ran Out of Food in the Last Year: Never true  Transportation Needs: No Transportation Needs (02/24/2021)   PRAPARE - Transportation    Lack of Transportation (Medical): No    Lack of Transportation (Non-Medical): No  Physical Activity: Not on file  Stress: No Stress Concern Present (02/24/2021)   [employer] of Occupational Health - Occupational Stress Questionnaire    Feeling of Stress : Only a little  Social Connections: Unknown (02/24/2021)   Social Connection and Isolation Panel [NHANES]    Frequency of Communication with Friends and Family: More than three times a week    Frequency of Social Gatherings with Friends and Family: More than three times a week    Attends Religious Services: Not on Marketing executive or Organizations: Not on file    Attends Banker Meetings: Not on file    Marital Status: Not on file     Review of Systems   Constitutional:  Negative for appetite change and unexpected weight change.  HENT:  Negative for congestion, sinus pressure and sore throat.   Eyes:  Negative for pain and visual disturbance.  Respiratory:  Positive for cough. Negative for chest tightness and shortness of breath.   Cardiovascular:  Negative for chest pain and palpitations.  Gastrointestinal:  Negative for abdominal pain, diarrhea, nausea and vomiting.  Genitourinary:  Negative for difficulty urinating and dysuria.  Musculoskeletal:  Negative for joint swelling and myalgias.  Skin:  Negative for color change and rash.  Neurological:  Negative for dizziness and headaches.  Hematological:  Negative for adenopathy. Does not bruise/bleed easily.  Psychiatric/Behavioral:  Negative for agitation and dysphoric mood.        Objective:     BP 128/70   Pulse 79   Temp 98 F (36.7 C)   Resp 16   Ht 5\' 4"  (1.626 m)   Wt 101 lb (45.8 kg)   LMP 12/29/1981   SpO2 98%   BMI 17.34 kg/m  Wt Readings from Last 3 Encounters:  10/22/23 101 lb (45.8 kg)  08/18/23 101 lb 4 oz (45.9 kg)  06/10/23 102 lb 3.2 oz (46.4 kg)    Physical Exam Vitals reviewed.  Constitutional:      General: She is not in acute distress.    Appearance: Normal appearance. She is well-developed.  HENT:     Head: Normocephalic and atraumatic.     Right Ear: External ear normal.     Left Ear: External ear normal.  Eyes:     General: No scleral icterus.       Right eye: No discharge.        Left eye: No discharge.     Conjunctiva/sclera: Conjunctivae normal.  Neck:     Thyroid: No thyromegaly.  Cardiovascular:     Rate and Rhythm: Normal rate and regular rhythm.  Pulmonary:     Effort: No tachypnea, accessory muscle usage or respiratory distress.     Breath sounds: Normal breath sounds. No decreased breath sounds or wheezing.  Chest:  Breasts:    Right: No inverted nipple, mass, nipple discharge or tenderness (no axillary adenopathy).      Left: No inverted nipple, mass, nipple discharge or tenderness (no axilarry adenopathy).  Abdominal:     General: Bowel sounds are normal.     Palpations: Abdomen is soft.     Tenderness: There is no abdominal tenderness.  Musculoskeletal:        General: No tenderness.     Cervical back: Neck supple.  Lymphadenopathy:     Cervical: No cervical adenopathy.  Skin:    Findings: No erythema or rash.  Neurological:     Mental Status: She is alert and oriented to person, place, and time.  Psychiatric:        Mood and Affect: Mood normal.        Behavior: Behavior normal.      Outpatient Encounter Medications as of 10/22/2023  Medication Sig   acetaminophen (TYLENOL) 500 MG tablet Take 1-2 tablets (500-1,000 mg total) by mouth every 6 (six) hours as needed for mild pain.   acyclovir (ZOVIRAX) 200 MG capsule Take 1 capsule (200 mg total) by mouth 2 (two) times daily.   amLODipine (NORVASC) 2.5 MG tablet Take 1 tablet (2.5 mg total) by mouth daily.   atorvastatin (LIPITOR) 20 MG tablet TAKE 1 TABLET BY MOUTH DAILY   calcium-vitamin D (OSCAL WITH D) 250-125 MG-UNIT tablet Take 1 tablet by mouth daily.   Cholecalciferol (VITAMIN D3 PO) Take by mouth.   clopidogrel (PLAVIX) 75 MG tablet Take 1 tablet (75 mg total) by mouth daily.   folic acid (FOLVITE) 1 MG tablet Take 1 mg by mouth daily.   ketoconazole (NIZORAL) 2 % shampoo Apply 1 Application topically 2 (two) times a week.   levothyroxine (SYNTHROID) 50 MCG tablet TAKE 1 TABLET EVERY DAY ON EMPTY STOMACHWITH A GLASS OF WATER AT  LEAST 30-60 MINBEFORE BREAKFAST   methotrexate 2.5 MG tablet Take 10 mg by mouth once a week.   vitamin B-12 (CYANOCOBALAMIN) 100 MCG tablet Take 100 mcg by mouth daily.   No facility-administered encounter medications on file as of 10/22/2023.     Lab Results  Component Value Date   WBC 4.4 10/20/2023   HGB 11.7 (L) 10/20/2023   HCT 36.2 10/20/2023   PLT 269.0 10/20/2023   GLUCOSE 91 10/20/2023   CHOL  150 10/20/2023   TRIG 45.0 10/20/2023   HDL 63.60 10/20/2023   LDLDIRECT 128.0 05/25/2013   LDLCALC 77 10/20/2023   ALT 12 10/20/2023   AST 19 10/20/2023   NA 141 10/20/2023   K 4.8 10/20/2023   CL 108 10/20/2023   CREATININE 0.62 10/20/2023   BUN 17 10/20/2023   CO2 28 10/20/2023   TSH 1.18 10/20/2023   HGBA1C 5.7 (H) 04/25/2017       Assessment & Plan:  Routine general medical examination at a health care facility  Health care maintenance Assessment & Plan: Scheduled for a physical today 10/22/23.  PAP previously through gyn.  Mammogram 02/02/23 - birads I.  Colonoscopy 09/2013 - tortuous colon.  Declined beast exam. Recommended f/u in 10 years.    Persistent cough Assessment & Plan: Persistent cough.  Check cxr.  Steroid nasal spray/saline nasal spray.  Follow.    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  Seeing rheumatology (Dr DeFoor).  Stable.    Palpitations Assessment & Plan: She initially had an ILR inserted post CVA - but had no evidence of atrial fib. Had removed. She notes occaisional palpitations which were due to PVC's and PAC's when she had an ILR. Had f/u with Dr Ladona Ridgel 01/2023.  Continue plavix.    Major depressive disorder, single episode, mild (HCC) Assessment & Plan: Increased stress as outlined.  Have discussed.  Has declined any further intervention.  Follow.    Falls Assessment & Plan: Two recent falls as outlined.  No residual pain from the fall.  Still with residual effects from her CVA.  Refer to PT for evaluation and treatment - unsteady gait.   Orders: -     Ambulatory referral to Physical Therapy     Dale Olivet, MD

## 2023-10-22 NOTE — Patient Instructions (Signed)
Delsym cough syrup  Nasacort nasal spray - 2 sprays each nostril one time per day.  Do this in the evening.

## 2023-10-23 ENCOUNTER — Encounter: Payer: Self-pay | Admitting: Internal Medicine

## 2023-10-23 DIAGNOSIS — R296 Repeated falls: Secondary | ICD-10-CM | POA: Insufficient documentation

## 2023-10-23 NOTE — Assessment & Plan Note (Signed)
Persistent cough.  Check cxr.  Steroid nasal spray/saline nasal spray.  Follow.

## 2023-10-23 NOTE — Assessment & Plan Note (Signed)
On MTX.  Seeing rheumatology (Dr DeFoor).  Stable.  

## 2023-10-23 NOTE — Assessment & Plan Note (Signed)
Two recent falls as outlined.  No residual pain from the fall.  Still with residual effects from her CVA.  Refer to PT for evaluation and treatment - unsteady gait.

## 2023-10-23 NOTE — Assessment & Plan Note (Signed)
Increased stress as outlined.  Have discussed.  Has declined any further intervention.  Follow.  

## 2023-10-23 NOTE — Assessment & Plan Note (Signed)
She initially had an ILR inserted post CVA - but had no evidence of atrial fib. Had removed. She notes occaisional palpitations which were due to PVC's and PAC's when she had an ILR. Had f/u with Dr Ladona Ridgel 01/2023.  Continue plavix.

## 2023-11-15 ENCOUNTER — Other Ambulatory Visit: Payer: Self-pay | Admitting: Internal Medicine

## 2023-12-06 ENCOUNTER — Ambulatory Visit (INDEPENDENT_AMBULATORY_CARE_PROVIDER_SITE_OTHER): Payer: PPO | Admitting: Nurse Practitioner

## 2024-01-05 ENCOUNTER — Other Ambulatory Visit: Payer: Self-pay | Admitting: Internal Medicine

## 2024-01-12 ENCOUNTER — Telehealth: Payer: Self-pay | Admitting: Internal Medicine

## 2024-01-12 DIAGNOSIS — D649 Anemia, unspecified: Secondary | ICD-10-CM

## 2024-01-12 DIAGNOSIS — E7800 Pure hypercholesterolemia, unspecified: Secondary | ICD-10-CM

## 2024-01-12 NOTE — Telephone Encounter (Signed)
 Patient need lab orders.

## 2024-01-18 ENCOUNTER — Telehealth: Payer: Self-pay | Admitting: Internal Medicine

## 2024-01-18 NOTE — Telephone Encounter (Signed)
Patient need lab orders.

## 2024-01-24 NOTE — Addendum Note (Signed)
Addended by: Rita Ohara D on: 01/24/2024 07:43 AM   Modules accepted: Orders

## 2024-01-26 ENCOUNTER — Other Ambulatory Visit (INDEPENDENT_AMBULATORY_CARE_PROVIDER_SITE_OTHER): Payer: PPO

## 2024-01-26 DIAGNOSIS — E7800 Pure hypercholesterolemia, unspecified: Secondary | ICD-10-CM

## 2024-01-26 DIAGNOSIS — D649 Anemia, unspecified: Secondary | ICD-10-CM

## 2024-01-26 LAB — HEPATIC FUNCTION PANEL
ALT: 11 U/L (ref 0–35)
AST: 18 U/L (ref 0–37)
Albumin: 4 g/dL (ref 3.5–5.2)
Alkaline Phosphatase: 93 U/L (ref 39–117)
Bilirubin, Direct: 0.1 mg/dL (ref 0.0–0.3)
Total Bilirubin: 0.6 mg/dL (ref 0.2–1.2)
Total Protein: 6.7 g/dL (ref 6.0–8.3)

## 2024-01-26 LAB — CBC WITH DIFFERENTIAL/PLATELET
Basophils Absolute: 0.1 10*3/uL (ref 0.0–0.1)
Basophils Relative: 1.4 % (ref 0.0–3.0)
Eosinophils Absolute: 0.2 10*3/uL (ref 0.0–0.7)
Eosinophils Relative: 3.6 % (ref 0.0–5.0)
HCT: 37.9 % (ref 36.0–46.0)
Hemoglobin: 12.2 g/dL (ref 12.0–15.0)
Lymphocytes Relative: 35.9 % (ref 12.0–46.0)
Lymphs Abs: 1.6 10*3/uL (ref 0.7–4.0)
MCHC: 32.2 g/dL (ref 30.0–36.0)
MCV: 95.8 fL (ref 78.0–100.0)
Monocytes Absolute: 0.4 10*3/uL (ref 0.1–1.0)
Monocytes Relative: 9 % (ref 3.0–12.0)
Neutro Abs: 2.2 10*3/uL (ref 1.4–7.7)
Neutrophils Relative %: 50.1 % (ref 43.0–77.0)
Platelets: 299 10*3/uL (ref 150.0–400.0)
RBC: 3.95 Mil/uL (ref 3.87–5.11)
RDW: 15.4 % (ref 11.5–15.5)
WBC: 4.4 10*3/uL (ref 4.0–10.5)

## 2024-01-26 LAB — IBC + FERRITIN
Ferritin: 55.1 ng/mL (ref 10.0–291.0)
Iron: 38 ug/dL — ABNORMAL LOW (ref 42–145)
Saturation Ratios: 13 % — ABNORMAL LOW (ref 20.0–50.0)
TIBC: 291.2 ug/dL (ref 250.0–450.0)
Transferrin: 208 mg/dL — ABNORMAL LOW (ref 212.0–360.0)

## 2024-01-26 LAB — BASIC METABOLIC PANEL
BUN: 12 mg/dL (ref 6–23)
CO2: 29 meq/L (ref 19–32)
Calcium: 9.2 mg/dL (ref 8.4–10.5)
Chloride: 106 meq/L (ref 96–112)
Creatinine, Ser: 0.68 mg/dL (ref 0.40–1.20)
GFR: 83.54 mL/min (ref >60.00)
Glucose, Bld: 86 mg/dL (ref 70–99)
Potassium: 3.9 meq/L (ref 3.5–5.1)
Sodium: 142 meq/L (ref 135–145)

## 2024-01-26 LAB — LIPID PANEL
Cholesterol: 163 mg/dL (ref 0–200)
HDL: 59.3 mg/dL (ref >39.00)
LDL Cholesterol: 89 mg/dL (ref 0–99)
NonHDL: 103.8
Total CHOL/HDL Ratio: 3
Triglycerides: 74 mg/dL (ref 0.0–149.0)
VLDL: 14.8 mg/dL (ref 0.0–40.0)

## 2024-01-28 ENCOUNTER — Ambulatory Visit (INDEPENDENT_AMBULATORY_CARE_PROVIDER_SITE_OTHER): Payer: PPO | Admitting: Internal Medicine

## 2024-01-28 VITALS — BP 118/70 | HR 80 | Temp 98.0°F | Resp 16 | Ht 64.0 in | Wt 95.4 lb

## 2024-01-28 DIAGNOSIS — E7800 Pure hypercholesterolemia, unspecified: Secondary | ICD-10-CM

## 2024-01-28 DIAGNOSIS — M0600 Rheumatoid arthritis without rheumatoid factor, unspecified site: Secondary | ICD-10-CM | POA: Diagnosis not present

## 2024-01-28 DIAGNOSIS — F320 Major depressive disorder, single episode, mild: Secondary | ICD-10-CM

## 2024-01-28 DIAGNOSIS — I10 Essential (primary) hypertension: Secondary | ICD-10-CM

## 2024-01-28 DIAGNOSIS — Z8673 Personal history of transient ischemic attack (TIA), and cerebral infarction without residual deficits: Secondary | ICD-10-CM | POA: Diagnosis not present

## 2024-01-28 DIAGNOSIS — R053 Chronic cough: Secondary | ICD-10-CM | POA: Diagnosis not present

## 2024-01-28 DIAGNOSIS — K219 Gastro-esophageal reflux disease without esophagitis: Secondary | ICD-10-CM

## 2024-01-28 DIAGNOSIS — E039 Hypothyroidism, unspecified: Secondary | ICD-10-CM | POA: Diagnosis not present

## 2024-01-28 DIAGNOSIS — R634 Abnormal weight loss: Secondary | ICD-10-CM | POA: Diagnosis not present

## 2024-01-28 DIAGNOSIS — G8320 Monoplegia of upper limb affecting unspecified side: Secondary | ICD-10-CM

## 2024-01-28 DIAGNOSIS — R87622 Low grade squamous intraepithelial lesion on cytologic smear of vagina (LGSIL): Secondary | ICD-10-CM

## 2024-01-28 NOTE — Progress Notes (Unsigned)
Subjective:    Patient ID: Donna Rhodes, female    DOB: March 25, 1946, 78 y.o.   MRN: [id number]  Patient here for  Chief Complaint  Patient presents with   Medical Management of Chronic Issues    HPI Here for a scheduled follow up - here to follow up regarding previous CVA and increased stress. Sees Dr Allena Katz - RA - controlled. Continue methotrexate. Reports increased stress. Increased family stress. Discussed. Her daughter is planning to move out and go to a rehab center 02/07/24. Her mother is living with her now. Plans to see what her options are for her mother - regarding placement. Still with some persistent cough. No acid reflux. Has lost weight. Occasional constipation. Controls with prune juice, colace and miralax. Some occasional abdominal  pain.    Past Medical History:  Diagnosis Date   Actinic keratosis    Atrophic vaginitis    Dysplasia of vagina    vin 1 - vulva and vaginal cuff   GERD (gastroesophageal reflux disease)    HCV infection    History of basal cell carcinoma (BCC) 04/11/2019   right lat lower eyelid margin Mohs 2020 Dr. Adriana Simas   HSV (herpes simplex virus) infection    Hypercholesterolemia    Hypertension    Hypothyroidism    Left-sided weakness    S/P CVA 4/18.  PT 2x/wk.   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   Thyroid nodule    pt unaware of this    Vaginal Pap smear, abnormal    lgsil   Past Surgical History:  Procedure Laterality Date   ADENOIDECTOMY     APPENDECTOMY  1965   BUNIONECTOMY  1994   left   CATARACT EXTRACTION W/PHACO Right 10/27/2017   Procedure: CATARACT EXTRACTION PHACO AND INTRAOCULAR LENS PLACEMENT (IOC) RIGHT;  Surgeon: Lockie Mola, MD;  Location: [facility];  Service: Ophthalmology;  Laterality: Right;  requests early (8-8:30  arrival)   CATARACT EXTRACTION W/PHACO Left 11/10/2017   Procedure: CATARACT EXTRACTION PHACO AND INTRAOCULAR LENS PLACEMENT (IOC) LEFT;  Surgeon: Lockie Mola, MD;  Location: [facility];  Service: Ophthalmology;  Laterality: Left;   CHOLECYSTECTOMY     FINGER SURGERY  1982   laser vein surgery     LOOP RECORDER INSERTION N/A 09/24/2017   Procedure: LOOP RECORDER INSERTION;  Surgeon: Marinus Maw, MD;  Location: [facility];  Service: Cardiovascular;  Laterality: N/A;   MANDIBLE RECONSTRUCTION  1992   TOENAIL EXCISION  1998   several   TONSILLECTOMY     TUBAL LIGATION  1977   VAGINAL HYSTERECTOMY  1983   als0- anterior/posterior colporrhaphy   VAGINAL WOUND CLOSURE / REPAIR  1999, 2000   VAGINAL WOUND CLOSURE / REPAIR     Family History  Problem Relation Age of Onset   Hypertension Father    Dementia Father    Osteoarthritis Mother    Arthritis/Rheumatoid Sister    Breast cancer Other        paternal side   Breast cancer Paternal Aunt    Breast cancer Cousin        maternal   Uterine cancer Maternal Grandmother    Ovarian cancer Neg Hx    Heart disease Neg Hx    Diabetes  Neg Hx    Colon cancer Neg Hx    Social History   Socioeconomic History   Marital status: Divorced    Spouse name: Not on file   Number of children: 3   Years of education: 12   Highest education level: Not on file  Occupational History    Comment: retired  Tobacco Use   Smoking status: Former    Current packs/day: 0.00    Types: Cigarettes    Quit date: 12/29/1979    Years since quitting: 44.1   Smokeless tobacco: Never  Vaping Use   Vaping status: Never Used  Substance and Sexual Activity   Alcohol use: No    Alcohol/week: 0.0 standard drinks of alcohol   Drug use: No   Sexual activity: Not Currently    Birth control/protection: Surgical  Other Topics Concern   Not on file  Social History Narrative   Her mother and her daughter live with her   Caffeine- 1 cup  coffee   Social Drivers of Corporate investment banker Strain: Low Risk  (02/24/2021)   Overall Financial Resource Strain (CARDIA)    Difficulty of Paying Living Expenses: Not hard at all  Food Insecurity: No Food Insecurity (02/24/2021)   Hunger Vital Sign    Worried About Running Out of Food in the Last Year: Never true    Ran Out of Food in the Last Year: Never true  Transportation Needs: No Transportation Needs (02/24/2021)   PRAPARE - Transportation    Lack of Transportation (Medical): No    Lack of Transportation (Non-Medical): No  Physical Activity: Not on file  Stress: No Stress Concern Present (02/24/2021)   [employer] of Occupational Health - Occupational Stress Questionnaire    Feeling of Stress : Only a little  Social Connections: Unknown (02/24/2021)   Social Connection and Isolation Panel [NHANES]    Frequency of Communication with Friends and Family: More than three times a week    Frequency of Social Gatherings with Friends and Family: More than three times a week    Attends Religious Services: Not on Marketing executive or Organizations: Not on file    Attends Banker Meetings: Not on file    Marital Status: Not on file     Review of Systems  Constitutional:  Negative for fever.       Weight loss as outlined.   HENT:  Negative for congestion and sinus pressure.   Respiratory:  Positive for cough. Negative for chest tightness and shortness of breath.   Cardiovascular:  Negative for chest pain, palpitations and leg swelling.  Gastrointestinal:  Negative for diarrhea, nausea and vomiting.       Some abdominal pain as outlined.   Genitourinary:  Negative for difficulty urinating and dysuria.  Musculoskeletal:  Negative for myalgias.       Some weakness - muscles.   Skin:  Negative for color change and rash.  Neurological:  Negative for dizziness and headaches.  Psychiatric/Behavioral:  Negative for agitation and dysphoric mood.         Objective:     BP 118/70   Pulse 80   Temp 98 F (36.7 C)   Resp 16   Ht 5\' 4"  (1.626 m)   Wt 95 lb 6.4 oz (43.3 kg)   LMP 12/29/1981   SpO2 97%   BMI 16.38 kg/m  Wt Readings from Last 3 Encounters:  01/28/24 95 lb 6.4 oz (43.3 kg)  10/22/23 101 lb (45.8 kg)  08/18/23 101 lb 4 oz (45.9 kg)    Physical Exam Vitals reviewed.  Constitutional:      General: She is not in acute distress.    Appearance: Normal appearance.  HENT:     Head: Normocephalic and atraumatic.     Right Ear: External ear normal.     Left Ear: External ear normal.     Mouth/Throat:     Pharynx: No oropharyngeal exudate or posterior oropharyngeal erythema.  Eyes:     General: No scleral icterus.       Right eye: No discharge.        Left eye: No discharge.     Conjunctiva/sclera: Conjunctivae normal.  Neck:     Thyroid: No thyromegaly.  Cardiovascular:     Rate and Rhythm: Normal rate and regular rhythm.  Pulmonary:     Effort: No respiratory distress.     Breath sounds: Normal breath sounds. No wheezing.  Abdominal:     General: Bowel sounds are normal.     Palpations: Abdomen is soft.     Tenderness: There is no abdominal tenderness.  Musculoskeletal:        General: No swelling or tenderness.     Cervical back: Neck supple. No tenderness.  Lymphadenopathy:     Cervical: No cervical adenopathy.  Skin:    Findings: No erythema or rash.  Neurological:     Mental Status: She is alert.  Psychiatric:        Mood and Affect: Mood normal.        Behavior: Behavior normal.         Outpatient Encounter Medications as of 01/28/2024  Medication Sig   acetaminophen (TYLENOL) 500 MG tablet Take 1-2 tablets (500-1,000 mg total) by mouth every 6 (six) hours as needed for mild pain.   acyclovir (ZOVIRAX) 200 MG capsule Take 1 capsule (200 mg total) by mouth 2 (two) times daily.   amLODipine (NORVASC) 2.5 MG tablet TAKE 1 TABLET BY MOUTH ONCE DAILY   atorvastatin (LIPITOR) 20 MG tablet TAKE 1  TABLET BY MOUTH DAILY   calcium-vitamin D (OSCAL WITH D) 250-125 MG-UNIT tablet Take 1 tablet by mouth daily.   Cholecalciferol (VITAMIN D3 PO) Take by mouth.   clopidogrel (PLAVIX) 75 MG tablet Take 1 tablet (75 mg total) by mouth daily.   folic acid (FOLVITE) 1 MG tablet Take 1 mg by mouth daily.   ketoconazole (NIZORAL) 2 % shampoo Apply 1 Application topically 2 (two) times a week.   levothyroxine (SYNTHROID) 50 MCG tablet TAKE 1 TABLET EVERY DAY ON EMPTY STOMACHWITH A GLASS OF WATER AT LEAST 30-60 MINBEFORE BREAKFAST   methotrexate 2.5 MG tablet Take 10 mg by mouth once a week.   vitamin B-12 (CYANOCOBALAMIN) 100 MCG tablet Take 100 mcg by mouth daily.   No facility-administered encounter medications on file as of 01/28/2024.     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  Seeing rheumatology (Dr DeFoor).  Stable.    Persistent cough Assessment & Plan: Persistent cough.  Trial of steroid nasal spray/saline nasal spray.  Given persistence and given weight loss, will check CT chest. Further w/up pending results.   Orders: -     CT CHEST W CONTRAST; Future  Major depressive disorder, single episode, mild (HCC) Assessment & Plan: Increased stress as outlined.  Have discussed.  Has declined any further intervention.  Follow.    LGSIL Pap smear of vagina Assessment & Plan: Has been followed  by gyn.  Recently evaluated by Dr Logan Bores.  Per gyn: Abnormal Paps of the vaginal cuff were discussed. Patient previously had LGSIL but a negative colposcopy.  Based on her low risk they discussed the possibility of a repeat Pap or simply considering her aged out of Pap smears because her risk of vaginal cancer is very low.  Per review of note, they reported she would like consider these options.  Have discussed f/u pap.  She will notify gyn if desires.    Hypothyroidism, unspecified type Assessment & Plan: On thyroid replacement.  Follow tsh.    Hypercholesterolemia Assessment & Plan: On lipitor.  Follow lipid panel and liver function tests.     History of CVA (cerebrovascular accident) Assessment & Plan: Continue plavix. Has been on lipitor.   Follow lipid panel.    Gastroesophageal reflux disease, unspecified whether esophagitis present Assessment & Plan: Continue protonix.  No acid reflux reported.  Follow.    Flaccid monoplegia of upper extremity (HCC) Assessment & Plan: Still some limitations.  Continue exercise.  Follow.    Essential hypertension Assessment & Plan: Continue amlodipine.  Blood pressure as outlined.  Follow pressures.  Follow metabolic panel.    Weight loss Assessment & Plan: Persistent weight loss. Persistent cough and intermittent abdominal pain.  Check CT chest, abdomen and pelvis. Discussed stress.  Follow.   Orders: -     CT ABDOMEN PELVIS W CONTRAST; Future -     CT CHEST W CONTRAST; Future     Dale Colona, MD

## 2024-01-29 ENCOUNTER — Encounter: Payer: Self-pay | Admitting: Internal Medicine

## 2024-01-29 NOTE — Progress Notes (Incomplete)
Subjective:    Patient ID: Donna Rhodes, female    DOB: 14-Jun-1946, 78 y.o.   MRN: [id number]  Patient here for  Chief Complaint  Patient presents with  . Medical Management of Chronic Issues    HPI Here for a scheduled follow up - here to follow up regarding previous CVA and increased stress. Sees Dr Allena Katz - RA - controlled. Continue methotrexate. Reports increased stress. Increased family stress. Discussed. Her daughter is planning to move out and go to a rehab center 02/07/24. Her mother is living with her now. Plans to see what her options are for her mother - regarding placement. Still with some persistent cough. No acid reflux. Has lost weight. Occasional constipation. Controls with prune juice, colace and miralax. Some occasional abdominal  pain.    Past Medical History:  Diagnosis Date  . Actinic keratosis   . Atrophic vaginitis   . Dysplasia of vagina    vin 1 - vulva and vaginal cuff  . GERD (gastroesophageal reflux disease)   . HCV infection   . History of basal cell carcinoma (BCC) 04/11/2019   right lat lower eyelid margin Mohs 2020 Dr. Adriana Simas  . HSV (herpes simplex virus) infection   . Hypercholesterolemia   . Hypertension   . Hypothyroidism   . Left-sided weakness    S/P CVA 4/18.  PT 2x/wk.  . Lung nodule    stable  . Menopausal state   . Osteoarthritis    hand involvement, cervical disc disease  . Osteoporosis   . Pelvic pain in female   . Rheumatoid arthritis (HCC)   . 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 Thyroid nodule    pt unaware of this   . Vaginal Pap smear, abnormal    lgsil   Past Surgical History:  Procedure Laterality Date  . ADENOIDECTOMY    . APPENDECTOMY  1965  . BUNIONECTOMY  1994   left  . CATARACT EXTRACTION W/PHACO Right 10/27/2017   Procedure: CATARACT EXTRACTION PHACO AND INTRAOCULAR LENS PLACEMENT (IOC) RIGHT;  Surgeon: Lockie Mola, MD;  Location: [facility];  Service: Ophthalmology;  Laterality: Right;   requests early (8-8:30 arrival)  . CATARACT EXTRACTION W/PHACO Left 11/10/2017   Procedure: CATARACT EXTRACTION PHACO AND INTRAOCULAR LENS PLACEMENT (IOC) LEFT;  Surgeon: Lockie Mola, MD;  Location: [facility];  Service: Ophthalmology;  Laterality: Left;  . CHOLECYSTECTOMY    . FINGER SURGERY  1982  . laser vein surgery    . LOOP RECORDER INSERTION N/A 09/24/2017   Procedure: LOOP RECORDER INSERTION;  Surgeon: Marinus Maw, MD;  Location: [facility];  Service: Cardiovascular;  Laterality: N/A;  . MANDIBLE RECONSTRUCTION  1992  . TOENAIL EXCISION  1998   several  . TONSILLECTOMY    . TUBAL LIGATION  1977  . VAGINAL HYSTERECTOMY  1983   als0- anterior/posterior colporrhaphy  . VAGINAL WOUND CLOSURE / REPAIR  1999, 2000  . VAGINAL WOUND CLOSURE / REPAIR     Family History  Problem Relation Age of Onset  . Hypertension Father   . Dementia Father   . Osteoarthritis Mother   . Arthritis/Rheumatoid Sister   . Breast cancer Other        paternal side  . Breast cancer Paternal Aunt   . Breast cancer Cousin        maternal  . Uterine cancer Maternal Grandmother   . Ovarian cancer Neg Hx   . Heart disease Neg Hx   . Diabetes  Neg Hx   . Colon cancer Neg Hx    Social History   Socioeconomic History  . Marital status: Divorced    Spouse name: Not on file  . Number of children: 3  . Years of education: 85  . Highest education level: Not on file  Occupational History    Comment: retired  Tobacco Use  . Smoking status: Former    Current packs/day: 0.00    Types: Cigarettes    Quit date: 12/29/1979    Years since quitting: 44.1  . Smokeless tobacco: Never  Vaping Use  . Vaping status: Never Used  Substance and Sexual Activity  . Alcohol use: No    Alcohol/week: 0.0 standard drinks of alcohol  . Drug use: No  . Sexual activity: Not Currently    Birth control/protection: Surgical  Other Topics Concern  . Not on file  Social History Narrative   Her  mother and her daughter live with her   Caffeine- 1 cup coffee   Social Drivers of Health   Financial Resource Strain: Low Risk  (02/24/2021)   Overall Financial Resource Strain (CARDIA)   . Difficulty of Paying Living Expenses: Not hard at all  Food Insecurity: No Food Insecurity (02/24/2021)   Hunger Vital Sign   . Worried About Programme researcher, broadcasting/film/video in the Last Year: Never true   . Ran Out of Food in the Last Year: Never true  Transportation Needs: No Transportation Needs (02/24/2021)   PRAPARE - Transportation   . Lack of Transportation (Medical): No   . Lack of Transportation (Non-Medical): No  Physical Activity: Not on file  Stress: No Stress Concern Present (02/24/2021)   [employer] of Occupational Health - Occupational Stress Questionnaire   . Feeling of Stress : Only a little  Social Connections: Unknown (02/24/2021)   Social Connection and Isolation Panel [NHANES]   . Frequency of Communication with Friends and Family: More than three times a week   . Frequency of Social Gatherings with Friends and Family: More than three times a week   . Attends Religious Services: Not on file   . Active Member of Clubs or Organizations: Not on file   . Attends Banker Meetings: Not on file   . Marital Status: Not on file     Review of Systems  Constitutional:  Negative for fever.       Weight loss as outlined.   HENT:  Negative for congestion and sinus pressure.   Respiratory:  Positive for cough. Negative for chest tightness and shortness of breath.   Cardiovascular:  Negative for chest pain, palpitations and leg swelling.  Gastrointestinal:  Negative for diarrhea, nausea and vomiting.       Some abdominal pain as outlined.   Genitourinary:  Negative for difficulty urinating and dysuria.  Musculoskeletal:  Negative for myalgias.       Some weakness - muscles.   Skin:  Negative for color change and rash.  Neurological:  Negative for dizziness and headaches.   Psychiatric/Behavioral:  Negative for agitation and dysphoric mood.        Objective:     BP 118/70   Pulse 80   Temp 98 F (36.7 C)   Resp 16   Ht 5\' 4"  (1.626 m)   Wt 95 lb 6.4 oz (43.3 kg)   LMP 12/29/1981   SpO2 97%   BMI 16.38 kg/m  Wt Readings from Last 3 Encounters:  01/28/24 95 lb 6.4 oz (43.3 kg)  10/22/23 101 lb (45.8 kg)  08/18/23 101 lb 4 oz (45.9 kg)    Physical Exam Vitals reviewed.  Constitutional:      General: She is not in acute distress.    Appearance: Normal appearance.  HENT:     Head: Normocephalic and atraumatic.     Right Ear: External ear normal.     Left Ear: External ear normal.     Mouth/Throat:     Pharynx: No oropharyngeal exudate or posterior oropharyngeal erythema.  Eyes:     General: No scleral icterus.       Right eye: No discharge.        Left eye: No discharge.     Conjunctiva/sclera: Conjunctivae normal.  Neck:     Thyroid: No thyromegaly.  Cardiovascular:     Rate and Rhythm: Normal rate and regular rhythm.  Pulmonary:     Effort: No respiratory distress.     Breath sounds: Normal breath sounds. No wheezing.  Abdominal:     General: Bowel sounds are normal.     Palpations: Abdomen is soft.     Tenderness: There is no abdominal tenderness.  Musculoskeletal:        General: No swelling or tenderness.     Cervical back: Neck supple. No tenderness.  Lymphadenopathy:     Cervical: No cervical adenopathy.  Skin:    Findings: No erythema or rash.  Neurological:     Mental Status: She is alert.  Psychiatric:        Mood and Affect: Mood normal.        Behavior: Behavior normal.     {Perform Simple Foot Exam  Perform Detailed exam:1} {Insert foot Exam (Optional):30965}   Outpatient Encounter Medications as of 01/28/2024  Medication Sig  . acetaminophen (TYLENOL) 500 MG tablet Take 1-2 tablets (500-1,000 mg total) by mouth every 6 (six) hours as needed for mild pain.  Marland Kitchen acyclovir (ZOVIRAX) 200 MG capsule Take 1  capsule (200 mg total) by mouth 2 (two) times daily.  Marland Kitchen amLODipine (NORVASC) 2.5 MG tablet TAKE 1 TABLET BY MOUTH ONCE DAILY  . atorvastatin (LIPITOR) 20 MG tablet TAKE 1 TABLET BY MOUTH DAILY  . calcium-vitamin D (OSCAL WITH D) 250-125 MG-UNIT tablet Take 1 tablet by mouth daily.  . Cholecalciferol (VITAMIN D3 PO) Take by mouth.  . clopidogrel (PLAVIX) 75 MG tablet Take 1 tablet (75 mg total) by mouth daily.  . folic acid (FOLVITE) 1 MG tablet Take 1 mg by mouth daily.  Marland Kitchen ketoconazole (NIZORAL) 2 % shampoo Apply 1 Application topically 2 (two) times a week.  . levothyroxine (SYNTHROID) 50 MCG tablet TAKE 1 TABLET EVERY DAY ON EMPTY STOMACHWITH A GLASS OF WATER AT LEAST 30-60 MINBEFORE BREAKFAST  . methotrexate 2.5 MG tablet Take 10 mg by mouth once a week.  . vitamin B-12 (CYANOCOBALAMIN) 100 MCG tablet Take 100 mcg by mouth daily.   No facility-administered encounter medications on file as of 01/28/2024.     Lab Results  Component Value Date   WBC 4.4 01/26/2024   HGB 12.2 01/26/2024   HCT 37.9 01/26/2024   PLT 299.0 01/26/2024   GLUCOSE 86 01/26/2024   CHOL 163 01/26/2024   TRIG 74.0 01/26/2024   HDL 59.30 01/26/2024   LDLDIRECT 128.0 05/25/2013   LDLCALC 89 01/26/2024   ALT 11 01/26/2024   AST 18 01/26/2024   NA 142 01/26/2024   K 3.9 01/26/2024   CL 106 01/26/2024   CREATININE 0.68 01/26/2024   BUN  12 01/26/2024   CO2 29 01/26/2024   TSH 1.18 10/20/2023   HGBA1C 5.7 (H) 04/25/2017       Assessment & Plan:  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, involving unspecified site [facility]) Assessment & Plan: On MTX.  Seeing rheumatology (Dr DeFoor).  Stable.    Persistent cough Assessment & Plan: Persistent cough.  Trial of steroid nasal spray/saline nasal spray.  Given persistence and given weight loss, will check CT chest. Further w/up pending results.    Major depressive disorder, single episode, mild (HCC) Assessment & Plan: Increased stress as outlined.   Have discussed.  Has declined any further intervention.  Follow.    LGSIL Pap smear of vagina Assessment & Plan: Has been followed by gyn.  Recently evaluated by Dr Logan Bores.  Per gyn: Abnormal Paps of the vaginal cuff were discussed. Patient previously had LGSIL but a negative colposcopy.  Based on her low risk they discussed the possibility of a repeat Pap or simply considering her aged out of Pap smears because her risk of vaginal cancer is very low.  Per review of note, they reported she would like consider these options.  Have discussed f/u pap.  She will notify gyn if desires.    Hypothyroidism, unspecified type Assessment & Plan: On thyroid replacement.  Follow tsh.    Hypercholesterolemia Assessment & Plan: On lipitor.  Follow lipid panel and liver function tests.     History of CVA (cerebrovascular accident) Assessment & Plan: Continue plavix. Has been on lipitor.   Follow lipid panel.    Gastroesophageal reflux disease, unspecified whether esophagitis present Assessment & Plan: Continue protonix.  No acid reflux reported.  Follow.    Flaccid monoplegia of upper extremity (HCC) Assessment & Plan: Still some limitations.  Continue exercise.  Follow.    Essential hypertension Assessment & Plan: Continue amlodipine.  Blood pressure as outlined.  Follow pressures.  Follow metabolic panel.    Weight loss Assessment & Plan: Persistent weight loss. Persistent cough and intermittent abdominal pain.  Check CT chest, abdomen and pelvis. Discussed stress.  Follow.       Dale Warm [place], MD

## 2024-01-29 NOTE — Assessment & Plan Note (Signed)
On MTX.  Seeing rheumatology (Dr DeFoor).  Stable.  

## 2024-01-30 DIAGNOSIS — R634 Abnormal weight loss: Secondary | ICD-10-CM | POA: Insufficient documentation

## 2024-01-30 NOTE — Assessment & Plan Note (Signed)
Has been followed by gyn.  Recently evaluated by Dr Evans.  Per gyn: Abnormal Paps of the vaginal cuff were discussed. Patient previously had LGSIL but a negative colposcopy.  Based on her low risk they discussed the possibility of a repeat Pap or simply considering her aged out of Pap smears because her risk of vaginal cancer is very low.  Per review of note, they reported she would like consider these options.  Have discussed f/u pap.  She will notify gyn if desires.  

## 2024-01-30 NOTE — Assessment & Plan Note (Signed)
 On thyroid replacement.  Follow tsh.

## 2024-01-30 NOTE — Assessment & Plan Note (Signed)
Increased stress as outlined.  Have discussed.  Has declined any further intervention.  Follow.  

## 2024-01-30 NOTE — Assessment & Plan Note (Signed)
Continue plavix. Has been on lipitor.   Follow lipid panel.  

## 2024-01-30 NOTE — Assessment & Plan Note (Signed)
Continue protonix.  No acid reflux reported.  Follow.  

## 2024-01-30 NOTE — Assessment & Plan Note (Signed)
Persistent cough.  Trial of steroid nasal spray/saline nasal spray.  Given persistence and given weight loss, will check CT chest. Further w/up pending results.

## 2024-01-30 NOTE — Assessment & Plan Note (Signed)
Persistent weight loss. Persistent cough and intermittent abdominal pain.  Check CT chest, abdomen and pelvis. Discussed stress.  Follow.

## 2024-01-30 NOTE — Assessment & Plan Note (Signed)
Still some limitations.  Continue exercise.  Follow.  

## 2024-01-30 NOTE — Assessment & Plan Note (Signed)
On lipitor.  Follow lipid panel and liver function tests.   

## 2024-01-30 NOTE — Assessment & Plan Note (Signed)
Continue amlodipine. Blood pressure as outlined.  Follow pressures.  Follow metabolic panel.  °

## 2024-02-01 ENCOUNTER — Other Ambulatory Visit: Payer: Self-pay | Admitting: Internal Medicine

## 2024-02-02 ENCOUNTER — Ambulatory Visit
Admission: RE | Admit: 2024-02-02 | Discharge: 2024-02-02 | Disposition: A | Discharge status: Home / Self Care | Payer: PPO | Source: Ambulatory Visit | Attending: Internal Medicine | Admitting: Internal Medicine

## 2024-02-02 DIAGNOSIS — R634 Abnormal weight loss: Secondary | ICD-10-CM | POA: Diagnosis not present

## 2024-02-02 DIAGNOSIS — I700 Atherosclerosis of aorta: Secondary | ICD-10-CM | POA: Diagnosis not present

## 2024-02-02 DIAGNOSIS — R053 Chronic cough: Secondary | ICD-10-CM | POA: Insufficient documentation

## 2024-02-02 DIAGNOSIS — R918 Other nonspecific abnormal finding of lung field: Secondary | ICD-10-CM | POA: Diagnosis not present

## 2024-02-02 DIAGNOSIS — K5730 Diverticulosis of large intestine without perforation or abscess without bleeding: Secondary | ICD-10-CM | POA: Diagnosis not present

## 2024-02-02 MED ORDER — IOHEXOL 300 MG/ML  SOLN
80.0000 mL | Freq: Once | INTRAMUSCULAR | Status: AC | PRN
  Administered 2024-02-02: 80 mL via INTRAVENOUS

## 2024-02-03 ENCOUNTER — Other Ambulatory Visit: Payer: Self-pay | Admitting: Internal Medicine

## 2024-02-03 ENCOUNTER — Ambulatory Visit: Payer: PPO

## 2024-02-03 DIAGNOSIS — R9389 Abnormal findings on diagnostic imaging of other specified body structures: Secondary | ICD-10-CM

## 2024-02-03 NOTE — Progress Notes (Signed)
 Order placed for referral to pulmonary - Dr Reyne Cave as pt requested.

## 2024-02-08 ENCOUNTER — Other Ambulatory Visit: Payer: PPO

## 2024-02-10 ENCOUNTER — Other Ambulatory Visit: Payer: Self-pay | Admitting: Pulmonary Disease

## 2024-02-10 DIAGNOSIS — F320 Major depressive disorder, single episode, mild: Secondary | ICD-10-CM | POA: Diagnosis not present

## 2024-02-10 DIAGNOSIS — K219 Gastro-esophageal reflux disease without esophagitis: Secondary | ICD-10-CM | POA: Diagnosis not present

## 2024-02-10 DIAGNOSIS — R053 Chronic cough: Secondary | ICD-10-CM | POA: Diagnosis not present

## 2024-02-10 DIAGNOSIS — R918 Other nonspecific abnormal finding of lung field: Secondary | ICD-10-CM | POA: Diagnosis not present

## 2024-02-10 DIAGNOSIS — T17500A Unspecified foreign body in bronchus causing asphyxiation, initial encounter: Secondary | ICD-10-CM | POA: Diagnosis not present

## 2024-02-10 DIAGNOSIS — I69354 Hemiplegia and hemiparesis following cerebral infarction affecting left non-dominant side: Secondary | ICD-10-CM | POA: Diagnosis not present

## 2024-02-10 DIAGNOSIS — M06072 Rheumatoid arthritis without rheumatoid factor, left ankle and foot: Secondary | ICD-10-CM | POA: Diagnosis not present

## 2024-02-10 DIAGNOSIS — J984 Other disorders of lung: Secondary | ICD-10-CM | POA: Diagnosis not present

## 2024-02-10 DIAGNOSIS — M06071 Rheumatoid arthritis without rheumatoid factor, right ankle and foot: Secondary | ICD-10-CM | POA: Diagnosis not present

## 2024-02-17 ENCOUNTER — Encounter
Admission: RE | Admit: 2024-02-17 | Discharge: 2024-02-17 | Disposition: A | Discharge status: Home / Self Care | Payer: PPO | Source: Ambulatory Visit | Attending: Pulmonary Disease | Admitting: Pulmonary Disease

## 2024-02-17 VITALS — Ht 64.0 in | Wt 95.7 lb

## 2024-02-17 DIAGNOSIS — I639 Cerebral infarction, unspecified: Secondary | ICD-10-CM

## 2024-02-17 DIAGNOSIS — Z01810 Encounter for preprocedural cardiovascular examination: Secondary | ICD-10-CM

## 2024-02-17 DIAGNOSIS — I10 Essential (primary) hypertension: Secondary | ICD-10-CM

## 2024-02-17 HISTORY — DX: Long term (current) use of anticoagulants: Z79.01

## 2024-02-17 HISTORY — DX: Repeated falls: R29.6

## 2024-02-17 HISTORY — DX: Personal history of nicotine dependence: Z87.891

## 2024-02-17 HISTORY — DX: Atrial premature depolarization: I49.1

## 2024-02-17 HISTORY — DX: Unsteadiness on feet: R26.81

## 2024-02-17 HISTORY — DX: Hemiplegia and hemiparesis following cerebral infarction affecting left non-dominant side: I69.354

## 2024-02-17 HISTORY — DX: Unspecified foreign body in bronchus causing asphyxiation, initial encounter: T17.500A

## 2024-02-17 HISTORY — DX: Other disorders of lung: J98.4

## 2024-02-17 HISTORY — DX: Ventricular premature depolarization: I49.3

## 2024-02-17 HISTORY — DX: Chronic cough: R05.3

## 2024-02-17 HISTORY — DX: Other nonspecific abnormal finding of lung field: R91.8

## 2024-02-17 NOTE — Patient Instructions (Signed)
 Your procedure is scheduled on:02-25-24 Friday Report to the Registration Desk on the 1st floor of the [facility].Then proceed to the 2nd floor Surgery Desk To find out your arrival time, please call [phone] between 1PM - 3PM on:02-24-24 Thursday If your arrival time is 6:00 am, do not arrive before that time as the [facility] entrance doors do not open until 6:00 am.  REMEMBER: Instructions that are not followed completely may result in serious medical risk, up to and including death; or upon the discretion of your surgeon and anesthesiologist your surgery may need to be rescheduled.  Do not eat food after midnight the night before surgery.  No gum chewing or hard candies.  You may however, drink CLEAR liquids up to 2 hours before you are scheduled to arrive for your surgery. Do not drink anything within 2 hours of your scheduled arrival time.  Clear liquids include: - water  - apple juice without pulp - gatorade (not RED colors) - black coffee or tea (Do NOT add milk or creamers to the coffee or tea) Do NOT drink anything that is not on this list.  One week prior to surgery: Stop Anti-inflammatories (NSAIDS) such as Advil, Aleve, Ibuprofen, Motrin, Naproxen, Naprosyn and Aspirin based products such as Excedrin, Goody's Powder, BC Powder. Stop ANY OVER THE COUNTER supplements until after surgery (Vitamin B12, Vitamin D3)-Continue Folic Acid since it is a prescription  You may however, continue to take Tylenol if needed for pain up until the day of surgery.  Stop clopidogrel (PLAVIX) 7 days prior to surgery-Last dose will be today 02-17-24 Thursday  Continue taking all of your other prescription medications up until the day of surgery.  ON THE DAY OF SURGERY ONLY TAKE THESE MEDICATIONS WITH SIPS OF WATER: -amLODipine (NORVASC)  -levothyroxine (SYNTHROID)  -pantoprazole (PROTONIX)   No Alcohol for 24 hours before or after surgery.  No Smoking including e-cigarettes for 24  hours before surgery.  No chewable tobacco products for at least 6 hours before surgery.  No nicotine patches on the day of surgery.  Do not use any "recreational" drugs for at least a week (preferably 2 weeks) before your surgery.  Please be advised that the combination of cocaine and anesthesia may have negative outcomes, up to and including death. If you test positive for cocaine, your surgery will be cancelled.  On the morning of surgery brush your teeth with toothpaste and water, you may rinse your mouth with mouthwash if you wish. Do not swallow any toothpaste or mouthwash.  Do not wear jewelry, make-up, hairpins, clips or nail polish.  For welded (permanent) jewelry: bracelets, anklets, waist bands, etc.  Please have this removed prior to surgery.  If it is not removed, there is a chance that hospital personnel will need to cut it off on the day of surgery.  Do not wear lotions, powders, or perfumes.   Do not shave body hair from the neck down 48 hours before surgery.  Contact lenses, hearing aids and dentures may not be worn into surgery.  Do not bring valuables to the hospital. [facility] is not responsible for any missing/lost belongings or valuables.   Notify your doctor if there is any change in your medical condition (cold, fever, infection).  Wear comfortable clothing (specific to your surgery type) to the hospital.  After surgery, you can help prevent lung complications by doing breathing exercises.  Take deep breaths and cough every 1-2 hours. Your doctor may order a device  called an Facilities manager to help you take deep breaths. When coughing or sneezing, hold a pillow firmly against your incision with both hands. This is called "splinting." Doing this helps protect your incision. It also decreases belly discomfort.  If you are being admitted to the hospital overnight, leave your suitcase in the car. After surgery it may be brought to your room.  In case of  increased patient census, it may be necessary for you, the patient, to continue your postoperative care in the Same Day Surgery department.  If you are being discharged the day of surgery, you will not be allowed to drive home. You will need a responsible individual to drive you home and stay with you for 24 hours after surgery.   If you are taking public transportation, you will need to have a responsible individual with you.  Please call the Pre-admissions Testing Dept. at [phone] if you have any questions about these instructions.  Surgery Visitation Policy:  Patients having surgery or a procedure may have two visitors.  Children under the age of 69 must have an adult with them who is not the patient.  Temporary Visitor Restrictions Due to increasing cases of flu, RSV and COVID-19: Children ages 70 and under will not be able to visit patients in [facility] hospitals under most circumstances.

## 2024-02-21 DIAGNOSIS — M0609 Rheumatoid arthritis without rheumatoid factor, multiple sites: Secondary | ICD-10-CM | POA: Diagnosis not present

## 2024-02-21 DIAGNOSIS — Z79899 Other long term (current) drug therapy: Secondary | ICD-10-CM | POA: Diagnosis not present

## 2024-02-22 ENCOUNTER — Encounter
Admission: RE | Admit: 2024-02-22 | Discharge: 2024-02-22 | Disposition: A | Discharge status: Home / Self Care | Payer: PPO | Source: Ambulatory Visit | Attending: Pulmonary Disease

## 2024-02-22 DIAGNOSIS — I639 Cerebral infarction, unspecified: Secondary | ICD-10-CM | POA: Diagnosis not present

## 2024-02-22 DIAGNOSIS — Z01810 Encounter for preprocedural cardiovascular examination: Secondary | ICD-10-CM | POA: Insufficient documentation

## 2024-02-22 DIAGNOSIS — I10 Essential (primary) hypertension: Secondary | ICD-10-CM | POA: Diagnosis not present

## 2024-02-24 ENCOUNTER — Ambulatory Visit (INDEPENDENT_AMBULATORY_CARE_PROVIDER_SITE_OTHER): Payer: PPO | Admitting: Internal Medicine

## 2024-02-24 VITALS — BP 128/70 | HR 73 | Temp 98.0°F | Resp 16 | Ht 64.0 in | Wt 94.6 lb

## 2024-02-24 DIAGNOSIS — E7800 Pure hypercholesterolemia, unspecified: Secondary | ICD-10-CM | POA: Diagnosis not present

## 2024-02-24 DIAGNOSIS — F32A Depression, unspecified: Secondary | ICD-10-CM

## 2024-02-24 DIAGNOSIS — G8320 Monoplegia of upper limb affecting unspecified side: Secondary | ICD-10-CM

## 2024-02-24 DIAGNOSIS — M0600 Rheumatoid arthritis without rheumatoid factor, unspecified site: Secondary | ICD-10-CM

## 2024-02-24 DIAGNOSIS — E039 Hypothyroidism, unspecified: Secondary | ICD-10-CM

## 2024-02-24 DIAGNOSIS — K219 Gastro-esophageal reflux disease without esophagitis: Secondary | ICD-10-CM

## 2024-02-24 DIAGNOSIS — Z8673 Personal history of transient ischemic attack (TIA), and cerebral infarction without residual deficits: Secondary | ICD-10-CM

## 2024-02-24 DIAGNOSIS — R634 Abnormal weight loss: Secondary | ICD-10-CM

## 2024-02-24 DIAGNOSIS — F320 Major depressive disorder, single episode, mild: Secondary | ICD-10-CM | POA: Diagnosis not present

## 2024-02-24 DIAGNOSIS — I10 Essential (primary) hypertension: Secondary | ICD-10-CM | POA: Diagnosis not present

## 2024-02-24 MED ORDER — LACTATED RINGERS IV SOLN: INTRAVENOUS | Status: DC

## 2024-02-24 MED ORDER — ORAL CARE MOUTH RINSE: 15.0000 mL | Freq: Once | OROMUCOSAL | Status: AC

## 2024-02-24 MED ORDER — CHLORHEXIDINE GLUCONATE 0.12 % MT SOLN
15.0000 mL | Freq: Once | OROMUCOSAL | Status: AC
  Administered 2024-02-25: 15 mL via OROMUCOSAL

## 2024-02-24 NOTE — Progress Notes (Signed)
 Subjective:    Patient ID: Donna Rhodes, female    DOB: [birth year], 78 y.o.   MRN: [id number]  Patient here for  Chief Complaint  Patient presents with   Medical Management of Chronic Issues    HPI Here for a scheduled follow up - f/u regarding weight loss and increased stress. Discussed increased stress. Daughter is out of her house. In rehab now. Her mother is still living with her. Increased stress related to this as well.  Tries to stay active. She is eating. Recent CT scan - discussed CT results. Saw Dr Tim Lair. Planning for bronchoscopy tomorrow. She is off her methrotrexate. She was concerned that methotrexate was causing her lung issues. She stopped taking. Back on protonix.    Past Medical History:  Diagnosis Date   Actinic keratosis    Apical lung scarring    Atrophic vaginitis    Chronic anticoagulation    Dysplasia of vagina    vin 1 - vulva and vaginal cuff   Former cigarette smoker    Frequent falls    GERD (gastroesophageal reflux disease)    HCV infection    Hemiparesis affecting left side as late effect of stroke (HCC)    History of basal cell carcinoma (BCC) 04/11/2019   right lat lower eyelid margin Mohs 2020 Dr. Adriana Simas   HSV (herpes simplex virus) infection    Hypercholesterolemia    Hypertension    Hypothyroidism    Lung nodule    stable   Menopausal state    Mucus plugging of bronchi    Multiple lung nodules on CT    Osteoarthritis    hand involvement, cervical disc disease   Osteoporosis    PAC (premature atrial contraction)    Pelvic pain in female    Persistent cough    PVC (premature ventricular contraction)    Rheumatoid arthritis (HCC)    Stroke (cerebrum) (HCC) 04/24/2017   Thyroid nodule    pt unaware of this    Unsteady gait when walking    Vaginal Pap smear, abnormal    lgsil   Past Surgical History:  Procedure Laterality Date   ADENOIDECTOMY     APPENDECTOMY  1965   BUNIONECTOMY  1994   left   CATARACT EXTRACTION  W/PHACO Right 10/27/2017   Procedure: CATARACT EXTRACTION PHACO AND INTRAOCULAR LENS PLACEMENT (IOC) RIGHT;  Surgeon: Lockie Mola, MD;  Location: [facility];  Service: Ophthalmology;  Laterality: Right;  requests early (8-8:30 arrival)   CATARACT EXTRACTION W/PHACO Left 11/10/2017   Procedure: CATARACT EXTRACTION PHACO AND INTRAOCULAR LENS PLACEMENT (IOC) LEFT;  Surgeon: Lockie Mola, MD;  Location: [facility];  Service: Ophthalmology;  Laterality: Left;   CHOLECYSTECTOMY     FINGER SURGERY  1982   laser vein surgery     LOOP RECORDER INSERTION N/A 09/24/2017   Procedure: LOOP RECORDER INSERTION;  Surgeon: Marinus Maw, MD;  Location: [facility];  Service: Cardiovascular;  Laterality: N/A;   LOOP RECORDER REMOVAL     MANDIBLE RECONSTRUCTION  1992   TOENAIL EXCISION  1998   several   TONSILLECTOMY     TUBAL LIGATION  1977   VAGINAL HYSTERECTOMY  1983   als0- anterior/posterior colporrhaphy   VAGINAL WOUND CLOSURE / REPAIR  1999, 2000   VAGINAL WOUND CLOSURE / REPAIR     Family History  Problem Relation Age of Onset   Hypertension Father    Dementia Father    Osteoarthritis Mother  Arthritis/Rheumatoid Sister    Breast cancer Other        paternal side   Breast cancer Paternal Aunt    Breast cancer Cousin        maternal   Uterine cancer Maternal Grandmother    Ovarian cancer Neg Hx    Heart disease Neg Hx    Diabetes Neg Hx    Colon cancer Neg Hx    Social History   Socioeconomic History   Marital status: Divorced    Spouse name: Not on file   Number of children: 3   Years of education: 12   Highest education level: Not on file  Occupational History    Comment: retired  Tobacco Use   Smoking status: Former    Current packs/day: 0.00    Types: Cigarettes    Quit date: 12/29/1979    Years since quitting: 44.1   Smokeless tobacco: Never  Vaping Use   Vaping status: Never Used  Substance and Sexual Activity   Alcohol  use: No    Alcohol/week: 0.0 standard drinks of alcohol   Drug use: No   Sexual activity: Not Currently    Birth control/protection: Surgical  Other Topics Concern   Not on file  Social History Narrative   Her mother and her daughter live with her   Caffeine- 1 cup coffee   Social Drivers of Corporate investment banker Strain: Low Risk  (02/24/2021)   Overall Financial Resource Strain (CARDIA)    Difficulty of Paying Living Expenses: Not hard at all  Food Insecurity: No Food Insecurity (02/24/2021)   Hunger Vital Sign    Worried About Running Out of Food in the Last Year: Never true    Ran Out of Food in the Last Year: Never true  Transportation Needs: No Transportation Needs (02/24/2021)   PRAPARE - Transportation    Lack of Transportation (Medical): No    Lack of Transportation (Non-Medical): No  Physical Activity: Not on file  Stress: No Stress Concern Present (02/24/2021)   [employer] of Occupational Health - Occupational Stress Questionnaire    Feeling of Stress : Only a little  Social Connections: Unknown (02/24/2021)   Social Connection and Isolation Panel [NHANES]    Frequency of Communication with Friends and Family: More than three times a week    Frequency of Social Gatherings with Friends and Family: More than three times a week    Attends Religious Services: Not on Marketing executive or Organizations: Not on file    Attends Banker Meetings: Not on file    Marital Status: Not on file     Review of Systems  Constitutional:  Negative for appetite change and unexpected weight change.  HENT:  Negative for congestion and sinus pressure.   Respiratory:  Negative for chest tightness.        Breathing stable.   Cardiovascular:  Negative for chest pain, palpitations and leg swelling.  Gastrointestinal:  Negative for abdominal pain, diarrhea, nausea and vomiting.  Genitourinary:  Negative for difficulty urinating and dysuria.   Musculoskeletal:  Negative for joint swelling and myalgias.  Skin:  Negative for color change and rash.  Neurological:  Negative for dizziness and headaches.  Psychiatric/Behavioral:  Negative for agitation and dysphoric mood.        Objective:     BP 128/70   Pulse 73   Temp 98 F (36.7 C)   Resp 16   Ht 5\' 4"  (  1.626 m)   Wt 94 lb 9.6 oz (42.9 kg)   LMP 12/29/1981   SpO2 98%   BMI 16.24 kg/m  Wt Readings from Last 3 Encounters:  02/25/24 94 lb 9.6 oz (42.9 kg)  02/24/24 94 lb 9.6 oz (42.9 kg)  02/17/24 95 lb 10.9 oz (43.4 kg)    Physical Exam Vitals reviewed.  Constitutional:      General: She is not in acute distress.    Appearance: Normal appearance.  HENT:     Head: Normocephalic and atraumatic.     Right Ear: External ear normal.     Left Ear: External ear normal.     Mouth/Throat:     Pharynx: No oropharyngeal exudate or posterior oropharyngeal erythema.  Eyes:     General: No scleral icterus.       Right eye: No discharge.        Left eye: No discharge.     Conjunctiva/sclera: Conjunctivae normal.  Neck:     Thyroid: No thyromegaly.  Cardiovascular:     Rate and Rhythm: Normal rate and regular rhythm.  Pulmonary:     Effort: No respiratory distress.     Breath sounds: Normal breath sounds. No wheezing.  Abdominal:     General: Bowel sounds are normal.     Palpations: Abdomen is soft.     Tenderness: There is no abdominal tenderness.  Musculoskeletal:        General: No swelling or tenderness.     Cervical back: Neck supple. No tenderness.  Lymphadenopathy:     Cervical: No cervical adenopathy.  Skin:    Findings: No erythema or rash.  Neurological:     Mental Status: She is alert.  Psychiatric:        Mood and Affect: Mood normal.        Behavior: Behavior normal.         Outpatient Encounter Medications as of 02/24/2024  Medication Sig   acetaminophen (TYLENOL) 500 MG tablet Take 1-2 tablets (500-1,000 mg total) by mouth every 6 (six)  hours as needed for mild pain.   acyclovir (ZOVIRAX) 200 MG capsule Take 1 capsule (200 mg total) by mouth 2 (two) times daily. (Patient taking differently: Take 200 mg by mouth as needed.)   amLODipine (NORVASC) 2.5 MG tablet TAKE 1 TABLET BY MOUTH ONCE DAILY (Patient taking differently: Take 2.5 mg by mouth every morning.)   atorvastatin (LIPITOR) 20 MG tablet TAKE 1 TABLET BY MOUTH DAILY (Patient taking differently: Take 20 mg by mouth 2 (two) times a week.)   Cholecalciferol (VITAMIN D3 PO) Take 1 tablet by mouth daily at 6 (six) AM.   clopidogrel (PLAVIX) 75 MG tablet TAKE 1 TABLET BY MOUTH DAILY (Patient taking differently: Take 75 mg by mouth daily with lunch.)   cyanocobalamin (VITAMIN B12) 1000 MCG tablet Take 1,000 mcg by mouth daily.   folic acid (FOLVITE) 1 MG tablet Take 1 mg by mouth daily.   ketoconazole (NIZORAL) 2 % shampoo Apply 1 Application topically as needed.   levothyroxine (SYNTHROID) 50 MCG tablet TAKE 1 TABLET EVERY DAY ON EMPTY STOMACHWITH A GLASS OF WATER AT LEAST 30-60 MINBEFORE BREAKFAST (Patient taking differently: Take 50 mcg by mouth daily before breakfast.)   methotrexate 2.5 MG tablet Take 10 mg by mouth once a week. Thursdays (Patient not taking: Reported on 02/17/2024)   pantoprazole (PROTONIX) 20 MG tablet Take 20 mg by mouth every morning.   No facility-administered encounter medications on file as of 02/24/2024.  Lab Results  Component Value Date   WBC 4.4 01/26/2024   HGB 12.2 01/26/2024   HCT 37.9 01/26/2024   PLT 299.0 01/26/2024   GLUCOSE 86 01/26/2024   CHOL 163 01/26/2024   TRIG 74.0 01/26/2024   HDL 59.30 01/26/2024   LDLDIRECT 128.0 05/25/2013   LDLCALC 89 01/26/2024   ALT 11 01/26/2024   AST 18 01/26/2024   NA 142 01/26/2024   K 3.9 01/26/2024   CL 106 01/26/2024   CREATININE 0.68 01/26/2024   BUN 12 01/26/2024   CO2 29 01/26/2024   TSH 1.18 10/20/2023   HGBA1C 5.7 (H) 04/25/2017       Assessment & Plan:  Weight  loss Assessment & Plan: Weight stable the last few checks. Discussed adding nutritional supplements with meals.    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, involving unspecified site [facility]) Assessment & Plan: Seeing rheumatology. She stopped her methotrexate. Was concerned affecting her lungs. Continue f/u with rheumatology.    Mild depression Assessment & Plan: Discussed increased stress. Her daughter has moved out now - in rehab. Still with increased stress - mother lives with her. Will notify me if feels needs further intervention.    Major depressive disorder, single episode, mild (HCC) Assessment & Plan: Discussed increased stress. Her daughter has moved out now - in rehab. Still with increased stress - mother lives with her. Will notify me if feels needs further intervention.    Hypothyroidism, unspecified type Assessment & Plan: On thyroid replacement. Follow tsh.   Hypercholesterolemia Assessment & Plan: Continue lipitor. Low cholesterol diet and exercise. Follow lipid panel and liver function tests.    History of CVA (cerebrovascular accident) Assessment & Plan: Continue plavix. Informed me she is currently off plavix for bronchoscopy tomorrow. Will restart when pulmonary ok's.    Gastroesophageal reflux disease, unspecified whether esophagitis present Assessment & Plan: On protonix. Follow.    Flaccid monoplegia of upper extremity (HCC) Assessment & Plan: Still with some limitations. Continue exercise. Follow.    Essential hypertension Assessment & Plan: Continue amlodipine. Follow pressures.  Follow metabolic panel.       Dale Fraser, MD

## 2024-02-25 ENCOUNTER — Ambulatory Visit: Payer: PPO

## 2024-02-25 ENCOUNTER — Ambulatory Visit: Payer: Self-pay | Admitting: Urgent Care

## 2024-02-25 ENCOUNTER — Other Ambulatory Visit: Payer: Self-pay

## 2024-02-25 ENCOUNTER — Ambulatory Visit: Payer: PPO | Admitting: Anesthesiology

## 2024-02-25 ENCOUNTER — Encounter
Admission: RE | Disposition: A | Discharge status: Home / Self Care | Payer: Self-pay | Source: Ambulatory Visit | Attending: Pulmonary Disease

## 2024-02-25 ENCOUNTER — Ambulatory Visit
Admission: RE | Admit: 2024-02-25 | Discharge: 2024-02-25 | Disposition: A | Discharge status: Home / Self Care | Payer: PPO | Source: Ambulatory Visit | Attending: Pulmonary Disease | Admitting: Pulmonary Disease

## 2024-02-25 DIAGNOSIS — T17890A Other foreign object in other parts of respiratory tract causing asphyxiation, initial encounter: Secondary | ICD-10-CM | POA: Diagnosis not present

## 2024-02-25 DIAGNOSIS — R0989 Other specified symptoms and signs involving the circulatory and respiratory systems: Secondary | ICD-10-CM | POA: Diagnosis not present

## 2024-02-25 DIAGNOSIS — K219 Gastro-esophageal reflux disease without esophagitis: Secondary | ICD-10-CM | POA: Insufficient documentation

## 2024-02-25 DIAGNOSIS — Z48813 Encounter for surgical aftercare following surgery on the respiratory system: Secondary | ICD-10-CM | POA: Diagnosis not present

## 2024-02-25 DIAGNOSIS — I10 Essential (primary) hypertension: Secondary | ICD-10-CM | POA: Diagnosis not present

## 2024-02-25 DIAGNOSIS — E039 Hypothyroidism, unspecified: Secondary | ICD-10-CM | POA: Diagnosis not present

## 2024-02-25 DIAGNOSIS — M069 Rheumatoid arthritis, unspecified: Secondary | ICD-10-CM | POA: Diagnosis not present

## 2024-02-25 DIAGNOSIS — Z87891 Personal history of nicotine dependence: Secondary | ICD-10-CM | POA: Diagnosis not present

## 2024-02-25 DIAGNOSIS — T17500A Unspecified foreign body in bronchus causing asphyxiation, initial encounter: Secondary | ICD-10-CM | POA: Diagnosis not present

## 2024-02-25 DIAGNOSIS — X58XXXA Exposure to other specified factors, initial encounter: Secondary | ICD-10-CM | POA: Diagnosis not present

## 2024-02-25 DIAGNOSIS — J984 Other disorders of lung: Secondary | ICD-10-CM | POA: Diagnosis not present

## 2024-02-25 DIAGNOSIS — R918 Other nonspecific abnormal finding of lung field: Secondary | ICD-10-CM | POA: Diagnosis not present

## 2024-02-25 HISTORY — PX: FLEXIBLE BRONCHOSCOPY: SHX5094

## 2024-02-25 HISTORY — PX: BRONCHIAL WASHINGS: SHX5105

## 2024-02-25 LAB — BODY FLUID CELL COUNT WITH DIFFERENTIAL
Eos, Fluid: 0 %
Lymphs, Fluid: 3 %
Monocyte-Macrophage-Serous Fluid: 4 % — ABNORMAL LOW (ref 50–90)
Neutrophil Count, Fluid: 93 % — ABNORMAL HIGH (ref 0–25)
Total Nucleated Cell Count, Fluid: 6300 uL — ABNORMAL HIGH (ref 0–1000)

## 2024-02-25 SURGERY — IRRIGATION, BRONCHUS
Anesthesia: General

## 2024-02-25 MED ORDER — DEXAMETHASONE SODIUM PHOSPHATE 10 MG/ML IJ SOLN
INTRAMUSCULAR | Status: DC | PRN
  Administered 2024-02-25: 8 mg via INTRAVENOUS

## 2024-02-25 MED ORDER — SUGAMMADEX SODIUM 200 MG/2ML IV SOLN
INTRAVENOUS | Status: AC
  Filled 2024-02-25: qty 2

## 2024-02-25 MED ORDER — SUGAMMADEX SODIUM 200 MG/2ML IV SOLN
INTRAVENOUS | Status: DC | PRN
  Administered 2024-02-25: 100 mg via INTRAVENOUS

## 2024-02-25 MED ORDER — DEXAMETHASONE SODIUM PHOSPHATE 10 MG/ML IJ SOLN
INTRAMUSCULAR | Status: AC
  Filled 2024-02-25: qty 1

## 2024-02-25 MED ORDER — ONDANSETRON HCL 4 MG/2ML IJ SOLN
INTRAMUSCULAR | Status: AC
  Filled 2024-02-25: qty 2

## 2024-02-25 MED ORDER — FENTANYL CITRATE (PF) 100 MCG/2ML IJ SOLN
INTRAMUSCULAR | Status: DC | PRN
Start: 2024-02-25 — End: 2024-02-25
  Administered 2024-02-25: 25 ug via INTRAVENOUS

## 2024-02-25 MED ORDER — PROPOFOL 1000 MG/100ML IV EMUL
INTRAVENOUS | Status: AC
  Filled 2024-02-25: qty 100

## 2024-02-25 MED ORDER — ESMOLOL HCL 100 MG/10ML IV SOLN
INTRAVENOUS | Status: DC | PRN
Start: 2024-02-25 — End: 2024-02-25
  Administered 2024-02-25: 10 mg via INTRAVENOUS

## 2024-02-25 MED ORDER — PROPOFOL 10 MG/ML IV BOLUS
INTRAVENOUS | Status: AC
  Filled 2024-02-25: qty 20

## 2024-02-25 MED ORDER — LIDOCAINE HCL (CARDIAC) PF 100 MG/5ML IV SOSY
PREFILLED_SYRINGE | INTRAVENOUS | Status: DC | PRN
  Administered 2024-02-25: 60 mg via INTRAVENOUS

## 2024-02-25 MED ORDER — ROCURONIUM BROMIDE 10 MG/ML (PF) SYRINGE
PREFILLED_SYRINGE | INTRAVENOUS | Status: AC
  Filled 2024-02-25: qty 10

## 2024-02-25 MED ORDER — ONDANSETRON HCL 4 MG/2ML IJ SOLN
INTRAMUSCULAR | Status: DC | PRN
  Administered 2024-02-25: 4 mg via INTRAVENOUS

## 2024-02-25 MED ORDER — REMIFENTANIL HCL 1 MG IV SOLR
INTRAVENOUS | Status: AC
  Filled 2024-02-25: qty 1000

## 2024-02-25 MED ORDER — OXYCODONE HCL 5 MG PO TABS: 5.0000 mg | ORAL_TABLET | Freq: Once | ORAL | Status: DC | PRN

## 2024-02-25 MED ORDER — OXYCODONE HCL 5 MG/5ML PO SOLN: 5.0000 mg | Freq: Once | ORAL | Status: DC | PRN

## 2024-02-25 MED ORDER — MIDAZOLAM HCL 2 MG/2ML IJ SOLN
INTRAMUSCULAR | Status: DC | PRN
  Administered 2024-02-25: 1 mg via INTRAVENOUS

## 2024-02-25 MED ORDER — PROPOFOL 10 MG/ML IV BOLUS
INTRAVENOUS | Status: DC | PRN
  Administered 2024-02-25: 30 mg via INTRAVENOUS
  Administered 2024-02-25: 50 ug/kg/min via INTRAVENOUS
  Administered 2024-02-25: 70 mg via INTRAVENOUS
  Administered 2024-02-25: 20 mg via INTRAVENOUS

## 2024-02-25 MED ORDER — MIDAZOLAM HCL 2 MG/2ML IJ SOLN
INTRAMUSCULAR | Status: AC
  Filled 2024-02-25: qty 2

## 2024-02-25 MED ORDER — PHENYLEPHRINE 80 MCG/ML (10ML) SYRINGE FOR IV PUSH (FOR BLOOD PRESSURE SUPPORT)
PREFILLED_SYRINGE | INTRAVENOUS | Status: AC
  Filled 2024-02-25: qty 10

## 2024-02-25 MED ORDER — FENTANYL CITRATE (PF) 100 MCG/2ML IJ SOLN: 25.0000 ug | INTRAMUSCULAR | Status: DC | PRN

## 2024-02-25 MED ORDER — ESMOLOL HCL 100 MG/10ML IV SOLN
INTRAVENOUS | Status: AC
  Filled 2024-02-25: qty 10

## 2024-02-25 MED ORDER — LIDOCAINE HCL (PF) 2 % IJ SOLN
INTRAMUSCULAR | Status: AC
  Filled 2024-02-25: qty 5

## 2024-02-25 MED ORDER — EPHEDRINE 5 MG/ML INJ
INTRAVENOUS | Status: AC
  Filled 2024-02-25: qty 5

## 2024-02-25 MED ORDER — FENTANYL CITRATE (PF) 100 MCG/2ML IJ SOLN
INTRAMUSCULAR | Status: AC
  Filled 2024-02-25: qty 2

## 2024-02-25 MED ORDER — SODIUM CHLORIDE 0.9 % IV SOLN
INTRAVENOUS | Status: DC | PRN
  Administered 2024-02-25: .01 ug/kg/min via INTRAVENOUS

## 2024-02-25 MED ORDER — ROCURONIUM BROMIDE 100 MG/10ML IV SOLN
INTRAVENOUS | Status: DC | PRN
  Administered 2024-02-25: 30 mg via INTRAVENOUS

## 2024-02-25 NOTE — Procedures (Signed)
 PROCEDURE: BRONCHOSCOPY Therapeutic Aspiration of Tracheobronchial Tree Fiberoptic bronchoscopy with bronchoalveolar lavage  PROCEDURE DATE: 02/25/2024  TIME:  NAME:  Donna Rhodes  DOB:1946/06/07  MRN: [id number] LOC:  ARPO/None    HOSP DAY: @LENGTHOFSTAYDAYS @ CODE STATUS:   Code Status History     Date Active Date Inactive Code Status Order ID Comments User Context   04/27/2017 1442 05/19/2017 1428 Full Code [id number]  Jerene Pitch Inpatient   04/27/2017 1442 04/27/2017 1442 Full Code [id number]  Jerene Pitch Inpatient   04/24/2017 1844 04/27/2017 1437 Full Code [id number]  Marguarite Arbour, MD Inpatient           Indications/Preliminary Diagnosis:   Consent: (Place X beside choice/s below)  The benefits, risks and possible complications of the procedure were        explained to:  _x__ patient  ___ patient's family  ___ other:___________  who verbalized understanding and gave:  ___ verbal  __x_ written  ___ verbal and written  ___ telephone  ___ other:________ consent.      Unable to obtain consent; procedure performed on emergent basis.     Other:       PRESEDATION ASSESSMENT: History and Physical has been performed. Patient meds and allergies have been reviewed. Presedation airway examination has been performed and documented. Baseline vital signs, sedation score, oxygenation status, and cardiac rhythm were reviewed. Patient was deemed to be in satisfactory condition to undergo the procedure.    PREMEDICATIONS:  As per anesthesia       PROCEDURE DETAILS: Timeout performed and correct patient, name, & ID confirmed. Following prep per Pulmonary policy, appropriate sedation was administered. The Bronchoscope was inserted in to oral cavity with bite block in place. Therapeutic aspiration of Tracheobronchial tree was performed.  Airway exam proceeded with findings, technical procedures, and specimen collection as noted below. At the end of exam the scope was  withdrawn without incident. Impression and Plan as noted below.       Airway Prep (Place X beside choice below)   1% Transtracheal Lidocaine Anesthetization 7 cc   Patient prepped per Bronchoscopy Lab Policy       Insertion Route (Place X beside choice below)   Nasal   Oral  x Endotracheal Tube   Tracheostomy   INTRAPROCEDURE MEDICATIONS: As per anesthesia  Medication Amt Dose  Medication Amt Dose  Lidocaine 1%  cc  Epinephrine 1:10,000 sol  cc  Xylocaine 4%  cc  Cocaine  cc   TECHNICAL PROCEDURES: (Place X beside choice below)   Procedures  Description    None     Electrocautery     Cryotherapy     Balloon Dilatation     Bronchography     Stent Placement   x  Therapeutic Aspiration RLL, RML, LLL    Laser/Argon Plasma    Brachytherapy Catheter Placement    Foreign Body Removal         SPECIMENS (Sites): (Place X beside choice below)  Specimens Description   No Specimens Obtained     Washings   X Lavage RML   Biopsies    Fine Needle Aspirates    Brushings    Sputum    FINDINGS:  ESTIMATED BLOOD LOSS: none COMPLICATIONS/RESOLUTION: none    Media Information  Document Information     Media Information  Document Information  Photos    IMPRESSION:POST-PROCEDURE DX:   SEVERE MUCUS PLUGGING BILATERALLY.  THERAPEUTIC ASPIRATION PERFORMED AT RLL, RML, LLL, LINGULA.  BAL  FOR MICRO,CYTO AND CELL COUNT AND DIFF IN PROCESS.   RECOMMENDATION/PLAN:   DISCUSSED FINDINGS WITH FAMILY POST PROCEDURE. AWAITING TESTING RESULTS    Vida Rigger, M.D.  Pulmonary & Critical Care Medicine  [facility]

## 2024-02-25 NOTE — Anesthesia Preprocedure Evaluation (Signed)
 Anesthesia Evaluation  Patient identified by MRN, date of birth, ID band Patient awake    Reviewed: Allergy & Precautions, NPO status , Patient's Chart, lab work & pertinent test results  History of Anesthesia Complications Negative for: history of anesthetic complications  Airway Mallampati: III  TM Distance: >3 FB Neck ROM: full    Dental  (+) Chipped   Pulmonary shortness of breath, former smoker   Pulmonary exam normal        Cardiovascular hypertension, On Medications negative cardio ROS Normal cardiovascular exam     Neuro/Psych  Headaches PSYCHIATRIC DISORDERS  Depression    CVA, Residual Symptoms    GI/Hepatic ,GERD  ,,(+) Hepatitis -, C  Endo/Other  Hypothyroidism    Renal/GU      Musculoskeletal   Abdominal   Peds  Hematology negative hematology ROS (+)   Anesthesia Other Findings Past Medical History: No date: Actinic keratosis No date: Apical lung scarring No date: Atrophic vaginitis No date: Chronic anticoagulation No date: Dysplasia of vagina     Comment:  vin 1 - vulva and vaginal cuff No date: Former cigarette smoker No date: Frequent falls No date: GERD (gastroesophageal reflux disease) No date: HCV infection No date: Hemiparesis affecting left side as late effect of stroke  (HCC) 04/11/2019: History of basal cell carcinoma (BCC)     Comment:  right lat lower eyelid margin Mohs 2020 Dr. Adriana Simas No date: HSV (herpes simplex virus) infection No date: Hypercholesterolemia No date: Hypertension No date: Hypothyroidism No date: Lung nodule     Comment:  stable No date: Menopausal state No date: Mucus plugging of bronchi No date: Multiple lung nodules on CT No date: Osteoarthritis     Comment:  hand involvement, cervical disc disease No date: Osteoporosis No date: PAC (premature atrial contraction) No date: Pelvic pain in female No date: Persistent cough No date: PVC (premature  ventricular contraction) No date: Rheumatoid arthritis (HCC) 04/24/2017: Stroke (cerebrum) (HCC) No date: Thyroid nodule     Comment:  pt unaware of this  No date: Unsteady gait when walking No date: Vaginal Pap smear, abnormal     Comment:  lgsil  Past Surgical History: No date: ADENOIDECTOMY 1965: APPENDECTOMY 1994: BUNIONECTOMY     Comment:  left 10/27/2017: CATARACT EXTRACTION W/PHACO; Right     Comment:  Procedure: CATARACT EXTRACTION PHACO AND INTRAOCULAR               LENS PLACEMENT (IOC) RIGHT;  Surgeon: Lockie Mola, MD;  Location: [facility];  Service:               Ophthalmology;  Laterality: Right;  requests early               (8-8:30 arrival) 11/10/2017: CATARACT EXTRACTION W/PHACO; Left     Comment:  Procedure: CATARACT EXTRACTION PHACO AND INTRAOCULAR               LENS PLACEMENT (IOC) LEFT;  Surgeon: Lockie Mola, MD;  Location: [facility];  Service:               Ophthalmology;  Laterality: Left; No date: CHOLECYSTECTOMY 1982: FINGER SURGERY No date: laser vein surgery 09/24/2017: LOOP RECORDER INSERTION; N/A     Comment:  Procedure: LOOP RECORDER INSERTION;  Surgeon: Ladona Ridgel,  Doylene Canning, MD;  Location: [facility];  Service:               Cardiovascular;  Laterality: N/A; No date: LOOP RECORDER REMOVAL 1992: MANDIBLE RECONSTRUCTION 1998: TOENAIL EXCISION     Comment:  several No date: TONSILLECTOMY 1977: TUBAL LIGATION 1983: VAGINAL HYSTERECTOMY     Comment:  als0- anterior/posterior colporrhaphy 1999, 2000: VAGINAL WOUND CLOSURE / REPAIR No date: VAGINAL WOUND CLOSURE / REPAIR  BMI    Body Mass Index: 16.24 kg/m      Reproductive/Obstetrics negative OB ROS                             Anesthesia Physical Anesthesia Plan  ASA: 3  Anesthesia Plan: General ETT   Post-op Pain Management: Ofirmev IV (intra-op)* and Toradol IV (intra-op)*    Induction: Intravenous  PONV Risk Score and Plan: 3 and Ondansetron, Dexamethasone and Treatment may vary due to age or medical condition  Airway Management Planned: Oral ETT  Additional Equipment:   Intra-op Plan:   Post-operative Plan: Extubation in OR  Informed Consent: I have reviewed the patients History and Physical, chart, labs and discussed the procedure including the risks, benefits and alternatives for the proposed anesthesia with the patient or authorized representative who has indicated his/her understanding and acceptance.     Dental Advisory Given  Plan Discussed with: Anesthesiologist, CRNA and Surgeon  Anesthesia Plan Comments: (Patient consented for risks of anesthesia including but not limited to:  - adverse reactions to medications - damage to eyes, teeth, lips or other oral mucosa - nerve damage due to positioning  - sore throat or hoarseness - Damage to heart, brain, nerves, lungs, other parts of body or loss of life  Patient voiced understanding and assent.)       Anesthesia Quick Evaluation

## 2024-02-25 NOTE — H&P (Signed)
 PULMONOLOGY         Date: 02/25/2024,   MRN# [id number] Donna Rhodes [birth year]     AdmissionWeight: 42.9 kg                 CurrentWeight: 42.9 kg  Referring provider: Dr Meredeth Ide    CHIEF COMPLAINT:   Bilateral mucus plugging   HISTORY OF PRESENT ILLNESS   Patient with RA on methotrexate with immunosuppression.  She came to pulmonary clinic with complaints of heavy phlegm that she was unable to expel and is cocerned due to ongoing cough x 6 months with congestion.  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  She had a CT chest showing mucus plugging at left lung and RML and we discussed therapeutic aspiration of tracheobronchial tree with bronchoscopy.  Patient is here today for bronchoscopy.  Plan is to perform airway inspection, followed by bronchoalveolar lavage and fiberoptic bronchoscopy with therapeutic aspiration of tracheobronchial tree. Reviewed risks/complications and benefits with patient, risks include infection, pneumothorax/pneumomediastinum which may require chest tube placement as well as overnight/prolonged hospitalization and possible mechanical ventilation. Other risks include bleeding and very rarely death.  Patient understands risks and wishes to proceed.  Additional questions were answered, and patient is aware that post procedure patient will be going home with family and may experience cough with possible clots on expectoration as well as phlegm which may last few days as well as hoarseness of voice post intubation and mechanical ventilation.    PAST MEDICAL HISTORY   Past Medical History:  Diagnosis Date   Actinic keratosis    Apical lung scarring    Atrophic vaginitis    Chronic anticoagulation    Dysplasia of vagina    vin 1 - vulva and vaginal cuff   Former  cigarette smoker    Frequent falls    GERD (gastroesophageal reflux disease)    HCV infection    Hemiparesis affecting left side as late effect of stroke (HCC)    History of basal cell carcinoma (BCC) 04/11/2019   right lat lower eyelid margin Mohs 2020 Dr. Adriana Simas   HSV (herpes simplex virus) infection    Hypercholesterolemia    Hypertension    Hypothyroidism    Lung nodule    stable   Menopausal state    Mucus plugging of bronchi    Multiple lung nodules on CT    Osteoarthritis    hand involvement, cervical disc disease   Osteoporosis    PAC (premature atrial contraction)    Pelvic pain in female    Persistent cough    PVC (premature ventricular contraction)    Rheumatoid arthritis (HCC)    Stroke (cerebrum) (HCC) 04/24/2017   Thyroid nodule    pt unaware of this    Unsteady gait when walking    Vaginal Pap smear, abnormal    lgsil     SURGICAL HISTORY   Past Surgical History:  Procedure Laterality Date   ADENOIDECTOMY     APPENDECTOMY  1965   BUNIONECTOMY  1994   left   CATARACT EXTRACTION W/PHACO Right 10/27/2017   Procedure: CATARACT EXTRACTION PHACO AND INTRAOCULAR LENS PLACEMENT (IOC) RIGHT;  Surgeon: Lockie Mola, MD;  Location: [facility];  Service: Ophthalmology;  Laterality: Right;  requests early (8-8:30 arrival)   CATARACT EXTRACTION W/PHACO Left 11/10/2017  Procedure: CATARACT EXTRACTION PHACO AND INTRAOCULAR LENS PLACEMENT (IOC) LEFT;  Surgeon: Lockie Mola, MD;  Location: [facility];  Service: Ophthalmology;  Laterality: Left;   CHOLECYSTECTOMY     FINGER SURGERY  1982   laser vein surgery     LOOP RECORDER INSERTION N/A 09/24/2017   Procedure: LOOP RECORDER INSERTION;  Surgeon: Marinus Maw, MD;  Location: [facility];  Service: Cardiovascular;  Laterality: N/A;   LOOP RECORDER REMOVAL     MANDIBLE RECONSTRUCTION  1992   TOENAIL EXCISION  1998   several   TONSILLECTOMY     TUBAL LIGATION  1977   VAGINAL  HYSTERECTOMY  1983   als0- anterior/posterior colporrhaphy   VAGINAL WOUND CLOSURE / REPAIR  1999, 2000   VAGINAL WOUND CLOSURE / REPAIR       FAMILY HISTORY   Family History  Problem Relation Age of Onset   Hypertension Father    Dementia Father    Osteoarthritis Mother    Arthritis/Rheumatoid Sister    Breast cancer Other        paternal side   Breast cancer Paternal Aunt    Breast cancer Cousin        maternal   Uterine cancer Maternal Grandmother    Ovarian cancer Neg Hx    Heart disease Neg Hx    Diabetes Neg Hx    Colon cancer Neg Hx      SOCIAL HISTORY   Social History   Tobacco Use   Smoking status: Former    Current packs/day: 0.00    Types: Cigarettes    Quit date: 12/29/1979    Years since quitting: 44.1   Smokeless tobacco: Never  Vaping Use   Vaping status: Never Used  Substance Use Topics   Alcohol use: No    Alcohol/week: 0.0 standard drinks of alcohol   Drug use: No     MEDICATIONS     Current Medication:  Current Facility-Administered Medications:    lactated ringers infusion, , Intravenous, Continuous, Piscitello, Cleda Mccreedy, MD    ALLERGIES   Amoxicillin, Avelox [moxifloxacin hcl in nacl], Azithromycin, Bacitracin, Codeine, Estroven weight management [nutritional supplements], Fish oil, Food, Moxifloxacin, Naproxen, Nsaids, and Cefepime     REVIEW OF SYSTEMS    Review of Systems:  Gen:  Denies  fever, sweats, chills weigh loss  HEENT: Denies blurred vision, double vision, ear pain, eye pain, hearing loss, nose bleeds, sore throat Cardiac:  No dizziness, chest pain or heaviness, chest tightness,edema Resp:   reports dyspnea chronically  Gi: Denies swallowing difficulty, stomach pain, nausea or vomiting, diarrhea, constipation, bowel incontinence Gu:  Denies bladder incontinence, burning urine Ext:   Denies Joint pain, stiffness or swelling Skin: Denies  skin rash, easy bruising or bleeding or hives Endoc:  Denies polyuria,  polydipsia , polyphagia or weight change Psych:   Denies depression, insomnia or hallucinations   Other:  All other systems negative   VS: BP (!) 146/73   Pulse 68   Temp 97.9 F (36.6 C) (Oral)   Resp 20   Ht 5\' 4"  (1.626 m)   Wt 42.9 kg   LMP 12/29/1981   SpO2 99%   BMI 16.24 kg/m      PHYSICAL EXAM    GENERAL:NAD, no fevers, chills, no weakness no fatigue HEAD: Normocephalic, atraumatic.  EYES: Pupils equal, round, reactive to light. Extraocular muscles intact. No scleral icterus.  MOUTH: Moist mucosal membrane. Dentition intact. No abscess noted.  EAR, NOSE, THROAT: Clear  without exudates. No external lesions.  NECK: Supple. No thyromegaly. No nodules. No JVD.  PULMONARY: decreased breath sounds with mild rhonchi worse at bases bilaterally.  CARDIOVASCULAR: S1 and S2. Regular rate and rhythm. No murmurs, rubs, or gallops. No edema. Pedal pulses 2+ bilaterally.  GASTROINTESTINAL: Soft, nontender, nondistended. No masses. Positive bowel sounds. No hepatosplenomegaly.  MUSCULOSKELETAL: No swelling, clubbing, or edema. Range of motion full in all extremities.  NEUROLOGIC: Cranial nerves II through XII are intact. No gross focal neurological deficits. Sensation intact. Reflexes intact.  SKIN: No ulceration, lesions, rashes, or cyanosis. Skin warm and dry. Turgor intact.  PSYCHIATRIC: Mood, affect within normal limits. The patient is awake, alert and oriented x 3. Insight, judgment intact.       IMAGING   CLINICAL DATA:  Unintentional weight loss, * Tracking Code: BO *   EXAM: CT CHEST, ABDOMEN, AND PELVIS WITH CONTRAST   TECHNIQUE: Multidetector CT imaging of the chest, abdomen and pelvis was performed following the standard protocol during bolus administration of intravenous contrast.   RADIATION DOSE REDUCTION: This exam was performed according to the departmental dose-optimization program which includes automated exposure control, adjustment of the mA and/or kV  according to patient size and/or use of iterative reconstruction technique.   CONTRAST:  80mL OMNIPAQUE IOHEXOL 300 MG/ML  SOLN   COMPARISON:  Multiple priors including chest CT September 23, 2010 and CT abdomen pelvis August 22, 2011   FINDINGS: CT CHEST FINDINGS   Cardiovascular: Aortic atherosclerosis. No central pulmonary embolus on this nondedicated study. Normal size heart. No significant pericardial effusion/thickening.   Mediastinum/Nodes: No suspicious thyroid nodule. No pathologically enlarged mediastinal, hilar or axillary lymph nodes. The esophagus is grossly unremarkable.   Lungs/Pleura: Mucoid impaction in the lingula, right middle lobe and right lower lobe with clustered nodularity in the right lower lobe and right middle lobe. Additional scattered solid bilateral pulmonary nodules measure up to 4 mm in the right lower lobe on image 74/4. biapical pleuroparenchymal scarring.   Musculoskeletal: No aggressive lytic or blastic lesion of bone. S shaped curvature of the thoracolumbar spine with associated degenerative change. Degenerative change of the bilateral shoulders.   CT ABDOMEN PELVIS FINDINGS   Hepatobiliary: No suspicious hepatic lesion. Gallbladder surgically absent. No biliary ductal dilation.   Pancreas: No pancreatic ductal dilation or evidence of acute inflammation.   Spleen: No splenomegaly.   Adrenals/Urinary Tract: Bilateral adrenal glands appear normal. No hydronephrosis. Subcentimeter hypodense renal lesions are technically too small to accurately characterize but statistically likely to reflect cysts and considered benign requiring no independent imaging follow-up in the absence of clinically indicated signs/symptoms. Urinary bladder is unremarkable for degree of distension.   Stomach/Bowel: Radiopaque enteric contrast material traverses distal loops of small bowel. Stomach is unremarkable for degree of distension. No pathologic dilation  of small or large bowel. Colonic diverticulosis without findings of acute diverticulitis.   Vascular/Lymphatic: Aortic atherosclerosis. Normal caliber abdominal aorta. The portal, splenic and superior mesenteric veins are patent. No pathologically enlarged abdominal or pelvic lymph nodes.   Reproductive: Status post hysterectomy. No adnexal masses.   Other: No significant abdominopelvic free fluid.   Musculoskeletal: Sclerotic focus in the left hemi sacrum and left femoral head are stable dating back to 2012 compatible with a benign finding. No aggressive lytic or blastic lesion of bone. S shaped curvature of the thoracolumbar spine with associated degenerative change.   IMPRESSION: 1. No discrete suspicious mass or lymphadenopathy identified in the chest, abdomen or pelvis. 2. Mucoid impaction in  the lingula, right middle lobe and right lower lobe with clustered nodularity in the right lower lobe and right middle lobe, likely reflecting atypical infection. 3. Additional scattered solid bilateral pulmonary nodules measure up to 4 mm in the right lower lobe, favored benign but nonspecific. Suggest follow-up chest CT in 3 months. 4. Colonic diverticulosis without findings of acute diverticulitis. 5.  Aortic Atherosclerosis (ICD10-I70.0).     Electronically Signed   By: Maudry Mayhew M.D.   On: 02/02/2024 18:06    ASSESSMENT/PLAN   Chronic cough with immunosupression      - mucus plugging of lingular segments, RML and RLL     - BAL for infectious workup     - workup for atypical infection      - therapeutic aspiration      -airway inspection bilaterally     --Reviewed risks/complications and benefits with patient, risks include infection, pneumothorax/pneumomediastinum which may require chest tube placement as well as overnight/prolonged hospitalization and possible mechanical ventilation. Other risks include bleeding and very rarely death.  Patient understands risks and  wishes to proceed.  Additional questions were answered, and patient is aware that post procedure patient will be going home with family and may experience cough with possible clots on expectoration as well as phlegm which may last few days as well as hoarseness of voice post intubation and mechanical ventilation.       Thank you for allowing me to participate in the care of this patient.   Patient/Family are satisfied with care plan and all questions have been answered.    Provider disclosure: Patient with at least one acute or chronic illness or injury that poses a threat to life or bodily function and is being managed actively during this encounter.  All of the below services have been performed independently by signing provider:  review of prior documentation from internal and or external health records.  Review of previous and current lab results.  Interview and comprehensive assessment during patient visit today. Review of current and previous chest radiographs/CT scans. Discussion of management and test interpretation with health care team and patient/family.   This document was prepared using Dragon voice recognition software and may include unintentional dictation errors.     Vida Rigger, M.D.  Division of Pulmonary & Critical Care Medicine

## 2024-02-25 NOTE — Transfer of Care (Signed)
 Immediate Anesthesia Transfer of Care Note  Patient: Donna Rhodes  Procedure(s) Performed: BRONCHIAL WASHINGS FLEXIBLE BRONCHOSCOPY  Patient Location: PACU  Anesthesia Type:General  Level of Consciousness: awake, alert , oriented, and patient cooperative  Airway & Oxygen Therapy: Patient Spontanous Breathing  Post-op Assessment: Report given to RN, Post -op Vital signs reviewed and stable, and Patient moving all extremities X 4  Post vital signs: Reviewed and stable  Last Vitals:  Vitals Value Taken Time  BP 134/67   Temp 97   Pulse 75 02/25/24 1313  Resp 19 02/25/24 1313  SpO2 95 % 02/25/24 1313  Vitals shown include unfiled device data.  Last Pain:  Vitals:   02/25/24 1122  TempSrc: Oral         Complications: No notable events documented.

## 2024-02-27 ENCOUNTER — Encounter: Payer: Self-pay | Admitting: Internal Medicine

## 2024-02-27 LAB — ACID FAST SMEAR (AFB, MYCOBACTERIA): Acid Fast Smear: NEGATIVE

## 2024-02-27 NOTE — Assessment & Plan Note (Signed)
 Discussed increased stress. Her daughter has moved out now - in rehab. Still with increased stress - mother lives with her. Will notify me if feels needs further intervention.

## 2024-02-27 NOTE — Assessment & Plan Note (Signed)
 Continue lipitor.  Low cholesterol diet and exercise.  Follow lipid panel and liver function tests.

## 2024-02-27 NOTE — Assessment & Plan Note (Signed)
 Still with some limitations. Continue exercise. Follow.

## 2024-02-27 NOTE — Assessment & Plan Note (Signed)
On protonix.  Follow.  

## 2024-02-27 NOTE — Assessment & Plan Note (Signed)
 Seeing rheumatology. She stopped her methotrexate. Was concerned affecting her lungs. Continue f/u with rheumatology.

## 2024-02-27 NOTE — Assessment & Plan Note (Signed)
 Continue amlodipine.  Follow pressures.  Follow metabolic panel.

## 2024-02-27 NOTE — Assessment & Plan Note (Signed)
 Weight stable the last few checks. Discussed adding nutritional supplements with meals.

## 2024-02-27 NOTE — Assessment & Plan Note (Signed)
 On thyroid replacement.  Follow tsh.

## 2024-02-27 NOTE — Assessment & Plan Note (Signed)
 Continue plavix. Informed me she is currently off plavix for bronchoscopy tomorrow. Will restart when pulmonary ok's.

## 2024-02-28 ENCOUNTER — Encounter: Payer: Self-pay | Admitting: Pulmonary Disease

## 2024-02-28 LAB — CYTOLOGY - NON PAP

## 2024-02-28 LAB — CULTURE, RESPIRATORY W GRAM STAIN

## 2024-03-06 NOTE — Anesthesia Postprocedure Evaluation (Signed)
 Anesthesia Post Note  Patient: Donna Rhodes  Procedure(s) Performed: BRONCHIAL WASHINGS FLEXIBLE BRONCHOSCOPY  Anesthesia Type: General Anesthetic complications: no   No notable events documented.   Last Vitals:  Vitals:   02/25/24 1400 02/25/24 1413  BP: 139/66 (!) 151/104  Pulse: 65 72  Resp: 12 16  Temp: (!) 36.3 C   SpO2: 90% 94%    Last Pain:  Vitals:   02/26/24 1853  TempSrc:   PainSc: 0-No pain                 Louie Boston

## 2024-03-08 DIAGNOSIS — R918 Other nonspecific abnormal finding of lung field: Secondary | ICD-10-CM | POA: Diagnosis not present

## 2024-03-08 DIAGNOSIS — J151 Pneumonia due to Pseudomonas: Secondary | ICD-10-CM | POA: Diagnosis not present

## 2024-03-08 DIAGNOSIS — Z9889 Other specified postprocedural states: Secondary | ICD-10-CM | POA: Diagnosis not present

## 2024-03-08 DIAGNOSIS — J984 Other disorders of lung: Secondary | ICD-10-CM | POA: Diagnosis not present

## 2024-03-08 DIAGNOSIS — J479 Bronchiectasis, uncomplicated: Secondary | ICD-10-CM | POA: Diagnosis not present

## 2024-03-08 DIAGNOSIS — T17500A Unspecified foreign body in bronchus causing asphyxiation, initial encounter: Secondary | ICD-10-CM | POA: Diagnosis not present

## 2024-03-15 ENCOUNTER — Encounter: Payer: Self-pay | Admitting: Pulmonary Disease

## 2024-03-17 LAB — CULTURE, FUNGUS WITHOUT SMEAR

## 2024-03-21 ENCOUNTER — Ambulatory Visit: Payer: PPO | Admitting: Internal Medicine

## 2024-03-21 VITALS — BP 116/68 | HR 85 | Temp 98.0°F | Resp 16 | Ht 64.0 in | Wt 93.8 lb

## 2024-03-21 DIAGNOSIS — I10 Essential (primary) hypertension: Secondary | ICD-10-CM | POA: Diagnosis not present

## 2024-03-21 DIAGNOSIS — Z8673 Personal history of transient ischemic attack (TIA), and cerebral infarction without residual deficits: Secondary | ICD-10-CM | POA: Diagnosis not present

## 2024-03-21 DIAGNOSIS — R7989 Other specified abnormal findings of blood chemistry: Secondary | ICD-10-CM | POA: Diagnosis not present

## 2024-03-21 DIAGNOSIS — E039 Hypothyroidism, unspecified: Secondary | ICD-10-CM

## 2024-03-21 DIAGNOSIS — S80862A Insect bite (nonvenomous), left lower leg, initial encounter: Secondary | ICD-10-CM

## 2024-03-21 DIAGNOSIS — K219 Gastro-esophageal reflux disease without esophagitis: Secondary | ICD-10-CM

## 2024-03-21 DIAGNOSIS — E7800 Pure hypercholesterolemia, unspecified: Secondary | ICD-10-CM | POA: Diagnosis not present

## 2024-03-21 DIAGNOSIS — W57XXXA Bitten or stung by nonvenomous insect and other nonvenomous arthropods, initial encounter: Secondary | ICD-10-CM | POA: Diagnosis not present

## 2024-03-21 DIAGNOSIS — F320 Major depressive disorder, single episode, mild: Secondary | ICD-10-CM

## 2024-03-21 DIAGNOSIS — G8320 Monoplegia of upper limb affecting unspecified side: Secondary | ICD-10-CM | POA: Diagnosis not present

## 2024-03-21 DIAGNOSIS — R634 Abnormal weight loss: Secondary | ICD-10-CM

## 2024-03-21 DIAGNOSIS — M0600 Rheumatoid arthritis without rheumatoid factor, unspecified site: Secondary | ICD-10-CM | POA: Diagnosis not present

## 2024-03-21 MED ORDER — LEVOTHYROXINE SODIUM 50 MCG PO TABS: 50.0000 ug | ORAL_TABLET | Freq: Every day | ORAL | 1 refills | Status: DC

## 2024-03-21 MED ORDER — AMLODIPINE BESYLATE 2.5 MG PO TABS: 2.5000 mg | ORAL_TABLET | Freq: Every day | ORAL | 1 refills | Status: DC

## 2024-03-21 MED ORDER — ACYCLOVIR 200 MG PO CAPS: 200.0000 mg | ORAL_CAPSULE | Freq: Two times a day (BID) | ORAL | 3 refills | Status: AC

## 2024-03-21 NOTE — Progress Notes (Unsigned)
 Subjective:    Patient ID: Donna Rhodes, female    DOB: 04-Aug-1946, 78 y.o.   MRN: [id number]  Patient here for  Chief Complaint  Patient presents with  . Medical Management of Chronic Issues    HPI Here for a scheduled follow up - follow up regarding increased stress, hypothyroidism and hypercholesterolemia. Is s/p recent bronchoscopy - persistent mucus plugging with pseudomonas infections. Seeing Dr Karna Christmas.  Evaluated 03/08/24 - prescribed levaquin. Breo stopped. Nebulizer treatment with muco mist and albuterol bid.    Past Medical History:  Diagnosis Date  . Actinic keratosis   . Apical lung scarring   . Atrophic vaginitis   . Chronic anticoagulation   . Dysplasia of vagina    vin 1 - vulva and vaginal cuff  . Former cigarette smoker   . Frequent falls   . GERD (gastroesophageal reflux disease)   . HCV infection   . Hemiparesis affecting left side as late effect of stroke (HCC)   . History of basal cell carcinoma (BCC) 04/11/2019   right lat lower eyelid margin Mohs 2020 Dr. Adriana Simas  . HSV (herpes simplex virus) infection   . Hypercholesterolemia   . Hypertension   . Hypothyroidism   . Lung nodule    stable  . Menopausal state   . Mucus plugging of bronchi   . Multiple lung nodules on CT   . Osteoarthritis    hand involvement, cervical disc disease  . Osteoporosis   . PAC (premature atrial contraction)   . Pelvic pain in female   . Persistent cough   . PVC (premature ventricular contraction)   . Rheumatoid arthritis (HCC)   . 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 Thyroid nodule    pt unaware of this   . Unsteady gait when walking   . Vaginal Pap smear, abnormal    lgsil   Past Surgical History:  Procedure Laterality Date  . ADENOIDECTOMY    . APPENDECTOMY  1965  . BRONCHIAL WASHINGS N/A 02/25/2024   Procedure: BRONCHIAL WASHINGS;  Surgeon: Vida Rigger, MD;  Location: [facility] ORS;  Service: Thoracic;  Laterality: N/A;  . BUNIONECTOMY  1994   left   . CATARACT EXTRACTION W/PHACO Right 10/27/2017   Procedure: CATARACT EXTRACTION PHACO AND INTRAOCULAR LENS PLACEMENT (IOC) RIGHT;  Surgeon: Lockie Mola, MD;  Location: [facility];  Service: Ophthalmology;  Laterality: Right;  requests early (8-8:30 arrival)  . CATARACT EXTRACTION W/PHACO Left 11/10/2017   Procedure: CATARACT EXTRACTION PHACO AND INTRAOCULAR LENS PLACEMENT (IOC) LEFT;  Surgeon: Lockie Mola, MD;  Location: [facility];  Service: Ophthalmology;  Laterality: Left;  . CHOLECYSTECTOMY    . FINGER SURGERY  1982  . FLEXIBLE BRONCHOSCOPY N/A 02/25/2024   Procedure: FLEXIBLE BRONCHOSCOPY;  Surgeon: Vida Rigger, MD;  Location: [facility] ORS;  Service: Thoracic;  Laterality: N/A;  . laser vein surgery    . LOOP RECORDER INSERTION N/A 09/24/2017   Procedure: LOOP RECORDER INSERTION;  Surgeon: Marinus Maw, MD;  Location: [facility] INVASIVE CV LAB;  Service: Cardiovascular;  Laterality: N/A;  . LOOP RECORDER REMOVAL    . MANDIBLE RECONSTRUCTION  1992  . TOENAIL EXCISION  1998   several  . TONSILLECTOMY    . TUBAL LIGATION  1977  . VAGINAL HYSTERECTOMY  1983   als0- anterior/posterior colporrhaphy  . VAGINAL WOUND CLOSURE / REPAIR  1999, 2000  . VAGINAL WOUND CLOSURE / REPAIR     Family History  Problem Relation Age of Onset  . Hypertension  Father   . Dementia Father   . Osteoarthritis Mother   . Arthritis/Rheumatoid Sister   . Breast cancer Other        paternal side  . Breast cancer Paternal Aunt   . Breast cancer Cousin        maternal  . Uterine cancer Maternal Grandmother   . Ovarian cancer Neg Hx   . Heart disease Neg Hx   . Diabetes Neg Hx   . Colon cancer Neg Hx    Social History   Socioeconomic History  . Marital status: Divorced    Spouse name: Not on file  . Number of children: 3  . Years of education: 66  . Highest education level: Not on file  Occupational History    Comment: retired  Tobacco Use  . Smoking status: Former     Current packs/day: 0.00    Types: Cigarettes    Quit date: 12/29/1979    Years since quitting: 44.2  . Smokeless tobacco: Never  Vaping Use  . Vaping status: Never Used  Substance and Sexual Activity  . Alcohol use: No    Alcohol/week: 0.0 standard drinks of alcohol  . Drug use: No  . Sexual activity: Not Currently    Birth control/protection: Surgical  Other Topics Concern  . Not on file  Social History Narrative   Her mother and her daughter live with her   Caffeine- 1 cup coffee   Social Drivers of Corporate investment banker Strain: Low Risk  (03/08/2024)   Received from [facility]   Overall Financial Resource Strain (CARDIA)   . Difficulty of Paying Living Expenses: Not hard at all  Food Insecurity: No Food Insecurity (03/08/2024)   Received from [facility]   Hunger Vital Sign   . Worried About Programme researcher, broadcasting/film/video in the Last Year: Never true   . Ran Out of Food in the Last Year: Never true  Transportation Needs: No Transportation Needs (03/08/2024)   Received from [facility]   [facility] - Transportation   . In the past 12 months, has lack of transportation kept you from medical appointments or from getting medications?: No   . Lack of Transportation (Non-Medical): No  Physical Activity: Not on file  Stress: No Stress Concern Present (02/24/2021)   [employer] of Occupational Health - Occupational Stress Questionnaire   . Feeling of Stress : Only a little  Social Connections: Unknown (02/24/2021)   Social Connection and Isolation Panel [NHANES]   . Frequency of Communication with Friends and Family: More than three times a week   . Frequency of Social Gatherings with Friends and Family: More than three times a week   . Attends Religious Services: Not on file   . Active Member of Clubs or Organizations: Not on file   . Attends Banker Meetings: Not on file   . Marital Status: Not on file      Review of Systems     Objective:     BP 116/68   Pulse 85   Temp 98 F (36.7 C)   Resp 16   Ht 5\' 4"  (1.626 m)   Wt 93 lb 12.8 oz (42.5 kg)   LMP 12/29/1981   SpO2 98%   BMI 16.10 kg/m  Wt Readings from Last 3 Encounters:  03/21/24 93 lb 12.8 oz (42.5 kg)  02/25/24 94 lb 9.6 oz (42.9 kg)  02/24/24 94 lb 9.6 oz (42.9 kg)  Physical Exam  {Perform Simple Foot Exam  Perform Detailed exam:1} {Insert foot Exam (Optional):30965}   Outpatient Encounter Medications as of 03/21/2024  Medication Sig  . acetaminophen (TYLENOL) 500 MG tablet Take 1-2 tablets (500-1,000 mg total) by mouth every 6 (six) hours as needed for mild pain.  Marland Kitchen acyclovir (ZOVIRAX) 200 MG capsule Take 1 capsule (200 mg total) by mouth 2 (two) times daily. (Patient taking differently: Take 200 mg by mouth as needed.)  . amLODipine (NORVASC) 2.5 MG tablet TAKE 1 TABLET BY MOUTH ONCE DAILY (Patient taking differently: Take 2.5 mg by mouth every morning.)  . atorvastatin (LIPITOR) 20 MG tablet TAKE 1 TABLET BY MOUTH DAILY (Patient taking differently: Take 20 mg by mouth 2 (two) times a week.)  . Cholecalciferol (VITAMIN D3 PO) Take 1 tablet by mouth daily at 6 (six) AM.  . clopidogrel (PLAVIX) 75 MG tablet TAKE 1 TABLET BY MOUTH DAILY (Patient taking differently: Take 75 mg by mouth daily with lunch.)  . cyanocobalamin (VITAMIN B12) 1000 MCG tablet Take 1,000 mcg by mouth daily.  . folic acid (FOLVITE) 1 MG tablet Take 1 mg by mouth daily.  Marland Kitchen ketoconazole (NIZORAL) 2 % shampoo Apply 1 Application topically as needed.  Marland Kitchen levothyroxine (SYNTHROID) 50 MCG tablet TAKE 1 TABLET EVERY DAY ON EMPTY STOMACHWITH A GLASS OF WATER AT LEAST 30-60 MINBEFORE BREAKFAST (Patient taking differently: Take 50 mcg by mouth daily before breakfast.)  . methotrexate 2.5 MG tablet Take 10 mg by mouth once a week. Thursdays (Patient not taking: Reported on 02/17/2024)  . pantoprazole (PROTONIX) 20 MG tablet Take 20 mg by mouth every  morning.   No facility-administered encounter medications on file as of 03/21/2024.     Lab Results  Component Value Date   WBC 4.4 01/26/2024   HGB 12.2 01/26/2024   HCT 37.9 01/26/2024   PLT 299.0 01/26/2024   GLUCOSE 86 01/26/2024   CHOL 163 01/26/2024   TRIG 74.0 01/26/2024   HDL 59.30 01/26/2024   LDLDIRECT 128.0 05/25/2013   LDLCALC 89 01/26/2024   ALT 11 01/26/2024   AST 18 01/26/2024   NA 142 01/26/2024   K 3.9 01/26/2024   CL 106 01/26/2024   CREATININE 0.68 01/26/2024   BUN 12 01/26/2024   CO2 29 01/26/2024   TSH 1.18 10/20/2023   HGBA1C 5.7 (H) 04/25/2017    DG Chest Port 1 View Result Date: 02/25/2024 CLINICAL DATA:  Post bronchoscopy. EXAM: PORTABLE CHEST 1 VIEW COMPARISON:  Radiographs 10/22/2023 and 05/26/2017.  CT 02/02/2024. FINDINGS: 1321 hours. The heart size and mediastinal contours are stable. There are low lung volumes with probable patchy bibasilar atelectasis. The clustered nodularity seen at the right lung base on recent CT is not well visualized radiographically. There is no confluent airspace disease, pleural effusion or pneumothorax. No acute osseous findings are evident. Telemetry leads overlie the chest. IMPRESSION: Low lung volumes with probable patchy bibasilar atelectasis. No evidence of pneumothorax following bronchoscopy. Electronically Signed   By: Carey Bullocks M.D.   On: 02/25/2024 17:15       Assessment & Plan:  Hypercholesterolemia     Dale Lebanon, MD

## 2024-03-24 ENCOUNTER — Encounter: Payer: Self-pay | Admitting: Internal Medicine

## 2024-03-24 DIAGNOSIS — W57XXXA Bitten or stung by nonvenomous insect and other nonvenomous arthropods, initial encounter: Secondary | ICD-10-CM | POA: Insufficient documentation

## 2024-03-24 NOTE — Assessment & Plan Note (Signed)
 On thyroid replacement.  Follow tsh.

## 2024-03-24 NOTE — Assessment & Plan Note (Signed)
Continue protonix.  Follow.   

## 2024-03-24 NOTE — Assessment & Plan Note (Signed)
Continue lipitor.  Follow lipid panel and liver function tests.   

## 2024-03-24 NOTE — Assessment & Plan Note (Signed)
 Tick removed as outlined. Tolerated. Area cleaned.

## 2024-03-24 NOTE — Assessment & Plan Note (Signed)
 Seeing rheumatology. She stopped her methotrexate. Was concerned affecting her lungs. Continue f/u with rheumatology. Stable.

## 2024-03-24 NOTE — Assessment & Plan Note (Signed)
Previous ultrasound revealed fatty liver.  Recent liver panel wnl.   

## 2024-03-24 NOTE — Assessment & Plan Note (Signed)
 Blood pressure as outlined.  Continue amlodipine.  Follow pressures.  Follow metabolic panel.

## 2024-03-24 NOTE — Assessment & Plan Note (Signed)
 Continue plavix and risk factor modification

## 2024-03-24 NOTE — Assessment & Plan Note (Signed)
 Still with limitations. Continue exercise. Follow.

## 2024-03-24 NOTE — Assessment & Plan Note (Signed)
 Weight stable. Continue to encourage increased po food intake.

## 2024-03-24 NOTE — Assessment & Plan Note (Signed)
 Discussed increased stress. Her daughter has moved out now -still in rehab. Still with increased stress - mother still living with her. Will notify me if feels needs any further intervention. Follow.

## 2024-04-04 DIAGNOSIS — J151 Pneumonia due to Pseudomonas: Secondary | ICD-10-CM | POA: Diagnosis not present

## 2024-04-04 DIAGNOSIS — J479 Bronchiectasis, uncomplicated: Secondary | ICD-10-CM | POA: Diagnosis not present

## 2024-04-04 DIAGNOSIS — T17500D Unspecified foreign body in bronchus causing asphyxiation, subsequent encounter: Secondary | ICD-10-CM | POA: Diagnosis not present

## 2024-04-05 DIAGNOSIS — J151 Pneumonia due to Pseudomonas: Secondary | ICD-10-CM | POA: Diagnosis not present

## 2024-04-05 DIAGNOSIS — T17500D Unspecified foreign body in bronchus causing asphyxiation, subsequent encounter: Secondary | ICD-10-CM | POA: Diagnosis not present

## 2024-04-05 DIAGNOSIS — J479 Bronchiectasis, uncomplicated: Secondary | ICD-10-CM | POA: Diagnosis not present

## 2024-04-10 DIAGNOSIS — Z961 Presence of intraocular lens: Secondary | ICD-10-CM | POA: Diagnosis not present

## 2024-04-10 DIAGNOSIS — H43813 Vitreous degeneration, bilateral: Secondary | ICD-10-CM | POA: Diagnosis not present

## 2024-04-10 LAB — ACID FAST CULTURE WITH REFLEXED SENSITIVITIES (MYCOBACTERIA): Acid Fast Culture: NEGATIVE

## 2024-04-12 DIAGNOSIS — J479 Bronchiectasis, uncomplicated: Secondary | ICD-10-CM | POA: Diagnosis not present

## 2024-05-03 NOTE — Progress Notes (Signed)
 Subjective:    Patient ID: Donna Rhodes, female    DOB: Apr 17, 1946, 78 y.o.   MRN: [id number]  Patient here for  Chief Complaint  Patient presents with   Medical Management of Chronic Issues    HPI Here for a scheduled follow up - follow up regarding increased stress, hypothyroidism and hypercholesterolemia. Is s/p recent bronchoscopy - persistent mucus plugging with pseudomonas infections. Seeing Dr Jaclynn Mast. Last appt 04/04/24 - She has a history of Pseudomonas infection in her lungs, with a CT scan from February 02, 2024, showing mucus impaction in the right middle and lower lobes. A bronchoscopy was performed to wash out the infection, and a follow-up scan in March showed improvement. F/u cxr 04/04/24 - ok. Overall doing better. Cough is better. Has f/u next week with pulmonary. Eating. Did gain a couple of pounds. No abdominal pain or bowel change reported. Increased stress. Daughter is still in rehab. Mother living with her.    Past Medical History:  Diagnosis Date   Actinic keratosis    Apical lung scarring    Atrophic vaginitis    Chronic anticoagulation    Dysplasia of vagina    vin 1 - vulva and vaginal cuff   Former cigarette smoker    Frequent falls    GERD (gastroesophageal reflux disease)    HCV infection    Hemiparesis affecting left side as late effect of stroke (HCC)    History of basal cell carcinoma (BCC) 04/11/2019   right lat lower eyelid margin Mohs 2020 Dr. Debrah Fan   HSV (herpes simplex virus) infection    Hypercholesterolemia    Hypertension    Hypothyroidism    Lung nodule    stable   Menopausal state    Mucus plugging of bronchi    Multiple lung nodules on CT    Osteoarthritis    hand involvement, cervical disc disease   Osteoporosis    PAC (premature atrial contraction)    Pelvic pain in female    Persistent cough    PVC (premature ventricular contraction)    Rheumatoid arthritis (HCC)    Stroke (cerebrum) (HCC) 04/24/2017   Thyroid  nodule     pt unaware of this    Unsteady gait when walking    Vaginal Pap smear, abnormal    lgsil   Past Surgical History:  Procedure Laterality Date   ADENOIDECTOMY     APPENDECTOMY  1965   BRONCHIAL WASHINGS N/A 02/25/2024   Procedure: BRONCHIAL WASHINGS;  Surgeon: Erskin Hearing, MD;  Location: [facility] ORS;  Service: Thoracic;  Laterality: N/A;   BUNIONECTOMY  1994   left   CATARACT EXTRACTION W/PHACO Right 10/27/2017   Procedure: CATARACT EXTRACTION PHACO AND INTRAOCULAR LENS PLACEMENT (IOC) RIGHT;  Surgeon: Annell Kidney, MD;  Location: [facility];  Service: Ophthalmology;  Laterality: Right;  requests early (8-8:30 arrival)   CATARACT EXTRACTION W/PHACO Left 11/10/2017   Procedure: CATARACT EXTRACTION PHACO AND INTRAOCULAR LENS PLACEMENT (IOC) LEFT;  Surgeon: Annell Kidney, MD;  Location: [facility];  Service: Ophthalmology;  Laterality: Left;   CHOLECYSTECTOMY     FINGER SURGERY  1982   FLEXIBLE BRONCHOSCOPY N/A 02/25/2024   Procedure: FLEXIBLE BRONCHOSCOPY;  Surgeon: Erskin Hearing, MD;  Location: [facility] ORS;  Service: Thoracic;  Laterality: N/A;   laser vein surgery     LOOP RECORDER INSERTION N/A 09/24/2017   Procedure: LOOP RECORDER INSERTION;  Surgeon: Tammie Fall, MD;  Location: [facility] INVASIVE CV LAB;  Service: Cardiovascular;  Laterality: N/A;  LOOP RECORDER REMOVAL     MANDIBLE RECONSTRUCTION  1992   TOENAIL EXCISION  1998   several   TONSILLECTOMY     TUBAL LIGATION  1977   VAGINAL HYSTERECTOMY  1983   als0- anterior/posterior colporrhaphy   VAGINAL WOUND CLOSURE / REPAIR  1999, 2000   VAGINAL WOUND CLOSURE / REPAIR     Family History  Problem Relation Age of Onset   Hypertension Father    Dementia Father    Osteoarthritis Mother    Arthritis/Rheumatoid Sister    Breast cancer Other        paternal side   Breast cancer Paternal Aunt    Breast cancer Cousin        maternal   Uterine cancer Maternal Grandmother    Ovarian cancer Neg Hx     Heart disease Neg Hx    Diabetes Neg Hx    Colon cancer Neg Hx    Social History   Socioeconomic History   Marital status: Divorced    Spouse name: Not on file   Number of children: 3   Years of education: 12   Highest education level: Not on file  Occupational History    Comment: retired  Tobacco Use   Smoking status: Former    Current packs/day: 0.00    Types: Cigarettes    Quit date: 12/29/1979    Years since quitting: 44.3   Smokeless tobacco: Never  Vaping Use   Vaping status: Never Used  Substance and Sexual Activity   Alcohol use: No    Alcohol/week: 0.0 standard drinks of alcohol   Drug use: No   Sexual activity: Not Currently    Birth control/protection: Surgical  Other Topics Concern   Not on file  Social History Narrative   Her mother and her daughter live with her   Caffeine- 1 cup coffee   Social Drivers of Corporate investment banker Strain: Low Risk  (03/08/2024)   Received from [facility]   Overall Financial Resource Strain (CARDIA)    Difficulty of Paying Living Expenses: Not hard at all  Food Insecurity: No Food Insecurity (03/08/2024)   Received from [facility]   Hunger Vital Sign    Worried About Running Out of Food in the Last Year: Never true    Ran Out of Food in the Last Year: Never true  Transportation Needs: No Transportation Needs (03/08/2024)   Received from [facility]    In the past 12 months, has lack of transportation kept you from medical appointments or from getting medications?: No    Lack of Transportation (Non-Medical): No  Physical Activity: Not on file  Stress: No Stress Concern Present (02/24/2021)   [employer] of Occupational Health - Occupational Stress Questionnaire    Feeling of Stress : Only a little  Social Connections: Unknown (02/24/2021)   Social Connection and Isolation Panel [NHANES]    Frequency of Communication with Friends  and Family: More than three times a week    Frequency of Social Gatherings with Friends and Family: More than three times a week    Attends Religious Services: Not on Marketing executive or Organizations: Not on file    Attends Banker Meetings: Not on file    Marital Status: Not on file     Review of Systems  Constitutional:  Negative for appetite change and unexpected weight change.  HENT:  Negative for congestion and sinus pressure.   Respiratory:  Negative for cough, chest tightness and shortness of breath.   Cardiovascular:  Negative for chest pain and palpitations.       No increased leg swelling.   Gastrointestinal:  Negative for abdominal pain, diarrhea, nausea and vomiting.  Genitourinary:  Negative for difficulty urinating and dysuria.  Musculoskeletal:  Negative for myalgias.  Skin:  Negative for color change and rash.  Neurological:  Negative for dizziness and headaches.  Psychiatric/Behavioral:  Negative for agitation and dysphoric mood.        Increased stress as outlined.        Objective:     BP 118/70   Temp 98 F (36.7 C)   Resp 16   Ht 5\' 4"  (1.626 m)   Wt 95 lb 12.8 oz (43.5 kg)   LMP 12/29/1981   SpO2 98%   BMI 16.44 kg/m  Wt Readings from Last 3 Encounters:  05/04/24 95 lb 12.8 oz (43.5 kg)  03/21/24 93 lb 12.8 oz (42.5 kg)  02/25/24 94 lb 9.6 oz (42.9 kg)    Physical Exam Vitals reviewed.  Constitutional:      General: She is not in acute distress.    Appearance: Normal appearance.  HENT:     Head: Normocephalic and atraumatic.     Right Ear: External ear normal.     Left Ear: External ear normal.     Mouth/Throat:     Pharynx: No oropharyngeal exudate or posterior oropharyngeal erythema.  Eyes:     General: No scleral icterus.       Right eye: No discharge.        Left eye: No discharge.     Conjunctiva/sclera: Conjunctivae normal.  Neck:     Thyroid : No thyromegaly.  Cardiovascular:     Rate and Rhythm:  Normal rate and regular rhythm.  Pulmonary:     Effort: No respiratory distress.     Breath sounds: Normal breath sounds. No wheezing.  Abdominal:     General: Bowel sounds are normal.     Palpations: Abdomen is soft.     Tenderness: There is no abdominal tenderness.  Musculoskeletal:        General: No swelling or tenderness.     Cervical back: Neck supple. No tenderness.  Lymphadenopathy:     Cervical: No cervical adenopathy.  Skin:    Findings: No erythema or rash.  Neurological:     Mental Status: She is alert.  Psychiatric:        Mood and Affect: Mood normal.        Behavior: Behavior normal.         Outpatient Encounter Medications as of 05/04/2024  Medication Sig   albuterol  (ACCUNEB ) 1.25 MG/3ML nebulizer solution Take by nebulization.   acetaminophen  (TYLENOL ) 500 MG tablet Take 1-2 tablets (500-1,000 mg total) by mouth every 6 (six) hours as needed for mild pain.   acyclovir  (ZOVIRAX ) 200 MG capsule Take 1 capsule (200 mg total) by mouth 2 (two) times daily.   amLODipine  (NORVASC ) 2.5 MG tablet Take 1 tablet (2.5 mg total) by mouth daily.   atorvastatin  (LIPITOR) 20 MG tablet TAKE 1 TABLET BY MOUTH DAILY (Patient taking differently: Take 20 mg by mouth 2 (two) times a week.)   Cholecalciferol (VITAMIN D3 PO) Take 1 tablet by mouth daily at 6 (six) AM.   clopidogrel  (PLAVIX ) 75 MG tablet TAKE 1 TABLET BY MOUTH DAILY (Patient taking differently: Take 75  mg by mouth daily with lunch.)   cyanocobalamin  (VITAMIN B12) 1000 MCG tablet Take 1,000 mcg by mouth daily.   folic acid  (FOLVITE ) 1 MG tablet Take 1 mg by mouth daily.   ketoconazole  (NIZORAL ) 2 % shampoo Apply 1 Application topically as needed.   levothyroxine  (SYNTHROID ) 50 MCG tablet Take 1 tablet (50 mcg total) by mouth daily. TAKE 1 TABLET EVERY DAY ON EMPTY STOMACHWITH A GLASS OF WATER AT LEAST 30-60 MINBEFORE BREAKFAST   pantoprazole  (PROTONIX ) 20 MG tablet Take 20 mg by mouth every morning.   No  facility-administered encounter medications on file as of 05/04/2024.     Lab Results  Component Value Date   WBC 4.4 01/26/2024   HGB 12.2 01/26/2024   HCT 37.9 01/26/2024   PLT 299.0 01/26/2024   GLUCOSE 92 05/04/2024   CHOL 176 05/04/2024   TRIG 62.0 05/04/2024   HDL 64.50 05/04/2024   LDLDIRECT 128.0 05/25/2013   LDLCALC 99 05/04/2024   ALT 15 05/04/2024   AST 21 05/04/2024   NA 141 05/04/2024   K 4.0 05/04/2024   CL 106 05/04/2024   CREATININE 0.83 05/04/2024   BUN 20 05/04/2024   CO2 27 05/04/2024   TSH 1.18 10/20/2023   HGBA1C 5.7 (H) 04/25/2017    DG Chest Port 1 View Result Date: 02/25/2024 CLINICAL DATA:  Post bronchoscopy. EXAM: PORTABLE CHEST 1 VIEW COMPARISON:  Radiographs 10/22/2023 and 05/26/2017.  CT 02/02/2024. FINDINGS: 1321 hours. The heart size and mediastinal contours are stable. There are low lung volumes with probable patchy bibasilar atelectasis. The clustered nodularity seen at the right lung base on recent CT is not well visualized radiographically. There is no confluent airspace disease, pleural effusion or pneumothorax. No acute osseous findings are evident. Telemetry leads overlie the chest. IMPRESSION: Low lung volumes with probable patchy bibasilar atelectasis. No evidence of pneumothorax following bronchoscopy. Electronically Signed   By: Elmon Hagedorn M.D.   On: 02/25/2024 17:15       Assessment & Plan:  Skin lesion -     Ambulatory referral to Dermatology  Hypercholesterolemia Assessment & Plan: Continue lipitor. Follow lipid panel and liver function tests. No changes in medication today.  Lab Results  Component Value Date   CHOL 176 05/04/2024   HDL 64.50 05/04/2024   LDLCALC 99 05/04/2024   LDLDIRECT 128.0 05/25/2013   TRIG 62.0 05/04/2024   CHOLHDL 3 05/04/2024     Orders: -     Hepatic function panel -     Lipid panel -     Basic metabolic panel with GFR  Benign essential HTN Assessment & Plan: Blood pressure as outlined.   Continue amlodipine .  Follow pressures. Follow metabolic panel.    Essential hypertension Assessment & Plan: Continue amlodipine . Follow pressures. Follow metabolic panel.    Flaccid monoplegia of upper extremity (HCC) Assessment & Plan: Continue exercise. Follow.    Gastroesophageal reflux disease, unspecified whether esophagitis present Assessment & Plan: Continue protonix . No upper symptoms reported.    History of CVA (cerebrovascular accident) Assessment & Plan: Continue plavix  and risk factor modification    Hypothyroidism, unspecified type Assessment & Plan: On thyroid  replacement. Follow tsh.    Major depressive disorder, single episode, mild (HCC) Assessment & Plan: Discussed increased stress. Her daughter has moved out now -still in rehab. Still with increased stress - mother still living with her. Overall appears to be doing better. Follow. Notify me if feels needs further intervention.    Rheumatoid arthritis with negative rheumatoid  factor, involving unspecified site [facility]) Assessment & Plan: Seeing rheumatology. She stopped her methotrexate . Was concerned affecting her lungs. Continue f/u with rheumatology. Stable.       Dellar Fenton, MD

## 2024-05-04 ENCOUNTER — Ambulatory Visit (INDEPENDENT_AMBULATORY_CARE_PROVIDER_SITE_OTHER): Admitting: Internal Medicine

## 2024-05-04 ENCOUNTER — Telehealth: Payer: Self-pay | Admitting: Internal Medicine

## 2024-05-04 VITALS — BP 118/70 | Temp 98.0°F | Resp 16 | Ht 64.0 in | Wt 95.8 lb

## 2024-05-04 DIAGNOSIS — Z8673 Personal history of transient ischemic attack (TIA), and cerebral infarction without residual deficits: Secondary | ICD-10-CM | POA: Diagnosis not present

## 2024-05-04 DIAGNOSIS — K219 Gastro-esophageal reflux disease without esophagitis: Secondary | ICD-10-CM

## 2024-05-04 DIAGNOSIS — E7800 Pure hypercholesterolemia, unspecified: Secondary | ICD-10-CM | POA: Diagnosis not present

## 2024-05-04 DIAGNOSIS — I10 Essential (primary) hypertension: Secondary | ICD-10-CM | POA: Diagnosis not present

## 2024-05-04 DIAGNOSIS — M0600 Rheumatoid arthritis without rheumatoid factor, unspecified site: Secondary | ICD-10-CM | POA: Diagnosis not present

## 2024-05-04 DIAGNOSIS — G8320 Monoplegia of upper limb affecting unspecified side: Secondary | ICD-10-CM

## 2024-05-04 DIAGNOSIS — F320 Major depressive disorder, single episode, mild: Secondary | ICD-10-CM | POA: Diagnosis not present

## 2024-05-04 DIAGNOSIS — L989 Disorder of the skin and subcutaneous tissue, unspecified: Secondary | ICD-10-CM

## 2024-05-04 DIAGNOSIS — E039 Hypothyroidism, unspecified: Secondary | ICD-10-CM | POA: Diagnosis not present

## 2024-05-04 NOTE — Telephone Encounter (Signed)
Handicap form signed and placed in box.

## 2024-05-04 NOTE — Patient Instructions (Addendum)
  [facility] [place] [phone]

## 2024-05-04 NOTE — Telephone Encounter (Signed)
 Pt stopped back in after appt to ask about a new prescription or form for a second handicap tag for her vehicles.  She states it's okay top mail one to her. Please reach out to pt to advise what should be done. Thank you! -[facility]

## 2024-05-04 NOTE — Telephone Encounter (Signed)
 Is it okay to complete a handicap form for pt?

## 2024-05-05 LAB — BASIC METABOLIC PANEL WITH GFR
BUN: 20 mg/dL (ref 6–23)
CO2: 27 meq/L (ref 19–32)
Calcium: 8.9 mg/dL (ref 8.4–10.5)
Chloride: 106 meq/L (ref 96–112)
Creatinine, Ser: 0.83 mg/dL (ref 0.40–1.20)
GFR: 67.49 mL/min (ref >60.00)
Glucose, Bld: 92 mg/dL (ref 70–99)
Potassium: 4 meq/L (ref 3.5–5.1)
Sodium: 141 meq/L (ref 135–145)

## 2024-05-05 LAB — LIPID PANEL
Cholesterol: 176 mg/dL (ref 0–200)
HDL: 64.5 mg/dL (ref >39.00)
LDL Cholesterol: 99 mg/dL (ref 0–99)
NonHDL: 111.25
Total CHOL/HDL Ratio: 3
Triglycerides: 62 mg/dL (ref 0.0–149.0)
VLDL: 12.4 mg/dL (ref 0.0–40.0)

## 2024-05-05 LAB — HEPATIC FUNCTION PANEL
ALT: 15 U/L (ref 0–35)
AST: 21 U/L (ref 0–37)
Albumin: 4 g/dL (ref 3.5–5.2)
Alkaline Phosphatase: 72 U/L (ref 39–117)
Bilirubin, Direct: 0.1 mg/dL (ref 0.0–0.3)
Total Bilirubin: 0.3 mg/dL (ref 0.2–1.2)
Total Protein: 6.8 g/dL (ref 6.0–8.3)

## 2024-05-05 NOTE — Telephone Encounter (Signed)
 Called patient back and passed along info.

## 2024-05-05 NOTE — Telephone Encounter (Signed)
 Spoke with pt to let her know that we have completed the form and put it in the mail. Pt gave a verbal understanding.

## 2024-05-07 ENCOUNTER — Encounter: Payer: Self-pay | Admitting: Internal Medicine

## 2024-05-07 NOTE — Assessment & Plan Note (Signed)
 Continue plavix and risk factor modification

## 2024-05-07 NOTE — Assessment & Plan Note (Signed)
Continue protonix.  No upper symptoms reported.

## 2024-05-07 NOTE — Assessment & Plan Note (Signed)
 Blood pressure as outlined.  Continue amlodipine.  Follow pressures.  Follow metabolic panel.

## 2024-05-07 NOTE — Assessment & Plan Note (Signed)
 Continue lipitor. Follow lipid panel and liver function tests. No changes in medication today.  Lab Results  Component Value Date   CHOL 176 05/04/2024   HDL 64.50 05/04/2024   LDLCALC 99 05/04/2024   LDLDIRECT 128.0 05/25/2013   TRIG 62.0 05/04/2024   CHOLHDL 3 05/04/2024

## 2024-05-07 NOTE — Assessment & Plan Note (Signed)
 Seeing rheumatology. She stopped her methotrexate. Was concerned affecting her lungs. Continue f/u with rheumatology. Stable.

## 2024-05-07 NOTE — Assessment & Plan Note (Signed)
 Discussed increased stress. Her daughter has moved out now -still in rehab. Still with increased stress - mother still living with her. Overall appears to be doing better. Follow. Notify me if feels needs further intervention.

## 2024-05-07 NOTE — Assessment & Plan Note (Signed)
 Continue exercise. Follow.

## 2024-05-07 NOTE — Assessment & Plan Note (Signed)
 On thyroid replacement.  Follow tsh.

## 2024-05-07 NOTE — Assessment & Plan Note (Signed)
 Continue amlodipine.  Follow pressures.  Follow metabolic panel.

## 2024-05-09 DIAGNOSIS — J984 Other disorders of lung: Secondary | ICD-10-CM | POA: Diagnosis not present

## 2024-05-09 DIAGNOSIS — M06071 Rheumatoid arthritis without rheumatoid factor, right ankle and foot: Secondary | ICD-10-CM | POA: Diagnosis not present

## 2024-05-09 DIAGNOSIS — R42 Dizziness and giddiness: Secondary | ICD-10-CM | POA: Diagnosis not present

## 2024-05-09 DIAGNOSIS — M06072 Rheumatoid arthritis without rheumatoid factor, left ankle and foot: Secondary | ICD-10-CM | POA: Diagnosis not present

## 2024-05-09 DIAGNOSIS — Z79899 Other long term (current) drug therapy: Secondary | ICD-10-CM | POA: Diagnosis not present

## 2024-05-09 DIAGNOSIS — T17500A Unspecified foreign body in bronchus causing asphyxiation, initial encounter: Secondary | ICD-10-CM | POA: Diagnosis not present

## 2024-05-09 DIAGNOSIS — R0602 Shortness of breath: Secondary | ICD-10-CM | POA: Diagnosis not present

## 2024-06-20 DIAGNOSIS — M0609 Rheumatoid arthritis without rheumatoid factor, multiple sites: Secondary | ICD-10-CM | POA: Diagnosis not present

## 2024-06-20 DIAGNOSIS — Z79899 Other long term (current) drug therapy: Secondary | ICD-10-CM | POA: Diagnosis not present

## 2024-07-12 DIAGNOSIS — J479 Bronchiectasis, uncomplicated: Secondary | ICD-10-CM | POA: Diagnosis not present

## 2024-07-18 ENCOUNTER — Ambulatory Visit: Admitting: Dermatology

## 2024-07-18 DIAGNOSIS — D692 Other nonthrombocytopenic purpura: Secondary | ICD-10-CM | POA: Diagnosis not present

## 2024-07-18 DIAGNOSIS — L821 Other seborrheic keratosis: Secondary | ICD-10-CM

## 2024-07-18 DIAGNOSIS — L820 Inflamed seborrheic keratosis: Secondary | ICD-10-CM

## 2024-07-18 DIAGNOSIS — Z85828 Personal history of other malignant neoplasm of skin: Secondary | ICD-10-CM | POA: Diagnosis not present

## 2024-07-18 DIAGNOSIS — L905 Scar conditions and fibrosis of skin: Secondary | ICD-10-CM

## 2024-07-18 DIAGNOSIS — D489 Neoplasm of uncertain behavior, unspecified: Secondary | ICD-10-CM

## 2024-07-18 DIAGNOSIS — W908XXA Exposure to other nonionizing radiation, initial encounter: Secondary | ICD-10-CM | POA: Diagnosis not present

## 2024-07-18 DIAGNOSIS — L578 Other skin changes due to chronic exposure to nonionizing radiation: Secondary | ICD-10-CM

## 2024-07-18 DIAGNOSIS — L219 Seborrheic dermatitis, unspecified: Secondary | ICD-10-CM

## 2024-07-18 DIAGNOSIS — D492 Neoplasm of unspecified behavior of bone, soft tissue, and skin: Secondary | ICD-10-CM

## 2024-07-18 MED ORDER — KETOCONAZOLE 2 % EX SHAM: 1.0000 | MEDICATED_SHAMPOO | CUTANEOUS | 11 refills | Status: AC | PRN

## 2024-07-18 NOTE — Progress Notes (Unsigned)
 Follow-Up Visit   Subjective  Donna Rhodes is a 78 y.o. female who presents for the following: The patient has spots, moles and lesions to be evaluated, some may be new or changing and the patient may have concern these could be cancer.  Spot on the left arm for 1 year growing and changing and will not go away.  The following portions of the chart were reviewed this encounter and updated as appropriate: medications, allergies, medical history  Review of Systems:  No other skin or systemic complaints except as noted in HPI or Assessment and Plan.  Objective  Well appearing patient in no apparent distress; mood and affect are within normal limits.  A focused examination was performed of the following areas:face, back, left arm    Relevant exam findings are noted in the Assessment and Plan.  left back braline x 1 Stuck-on, waxy, tan-brown papules and plaques -- Discussed benign etiology and prognosis.   Assessment & Plan   SEBORRHEIC DERMATITIS Exam: Pink patches with greasy scale Seborrheic Dermatitis is a chronic persistent rash characterized by pinkness and scaling most commonly of the mid face but also can occur on the scalp (dandruff), ears; mid chest, mid back and groin.  It tends to be exacerbated by stress and cooler weather.  People who have neurologic disease may experience new onset or exacerbation of existing seborrheic dermatitis.  The condition is not curable but treatable and can be controlled.   Treatment Plan: Continue Ketoconazole  2% shampoo 3 days a week to scalp, let sit 5 minutes and rinse out.     Purpura - Chronic; persistent and recurrent.  Treatable, but not curable. Arms  - Violaceous macules and patches - Benign - Related to trauma, age, sun damage and/or use of blood thinners, chronic use of topical and/or oral steroids - Observe - Can use OTC arnica containing moisturizer such as Dermend Bruise Formula if desired - Call for worsening or other  concerns     Tendon shift tumor vs Cyst vs Fibroma R/O Cancer with history of growth   Exam: 0.8 cm firm mobile papule on the left proximal tricep   Dr Claudene examined patient today   Recommend referral to general surgeon for removal due papule being deeper than skin surface   SCAR Hx of BCC  Right lower eyelid margin   Exam: scar with hypopigmentation no sign of recurrence  Benign-appearing.  Observation.  Call clinic for new or changing lesions. Recommend daily broad spectrum sunscreen SPF 30+, reapply every 2 hours as needed. Treatment: Recommend Serica moisturizing scar formula cream every night or [employer] brand or Mederma silicone scar sheet every night for the first year after a scar appears to help with scar remodeling if desired. Scars remodel on their own for a full year and will gradually improve in appearance over time.     SEBORRHEIC KERATOSIS - Stuck-on, waxy, tan-brown papules and/or plaques  - Benign-appearing - Discussed benign etiology and prognosis. - Observe - Call for any changes  INFLAMED SEBORRHEIC KERATOSIS left back braline x 1 Destruction of lesion - left back braline x 1 Complexity: simple   Destruction method: cryotherapy   Informed consent: discussed and consent obtained   Timeout:  patient name, date of birth, surgical site, and procedure verified Lesion destroyed using liquid nitrogen: Yes   Region frozen until ice ball extended beyond lesion: Yes   Outcome: patient tolerated procedure well with no complications   Post-procedure details: wound care instructions given  NEOPLASM OF UNCERTAIN BEHAVIOR   Related Procedures Ambulatory referral to General Surgery   Return in about 1 year (around 07/18/2025) for hx of BCC.  IFay Kirks, CMA, am acting as scribe for Alm Rhyme, MD .   Documentation: I have reviewed the above documentation for accuracy and completeness, and I agree with the above.  Alm Rhyme, MD

## 2024-07-18 NOTE — Patient Instructions (Addendum)

## 2024-07-19 ENCOUNTER — Encounter: Payer: Self-pay | Admitting: Dermatology

## 2024-07-19 ENCOUNTER — Telehealth: Payer: Self-pay

## 2024-07-19 ENCOUNTER — Ambulatory Visit: Payer: Self-pay

## 2024-07-19 NOTE — Telephone Encounter (Signed)
 Reviewed. Per note, appears to be doing better. Please confirm. Needs to stick with bland foods and then advance diet slowly as symptoms continue to improve. Stay hydrated. Confirm no abdominal pain or vomiting. Would hold on imodium. Can try kaopectate if needs something to help with increased loose stool  also can start a probiotic.  If persistent symptoms, will need to be evaluated.

## 2024-07-19 NOTE — Telephone Encounter (Signed)
 Refused appt.   FYI Only or Action Required?: FYI only for provider.  Patient was last seen in primary care on 05/04/2024 by Glendia Shad, MD.  Called Nurse Triage reporting Diarrhea.  Symptoms began yesterday.  Interventions attempted: Nothing.  Symptoms are: unchanged.  Triage Disposition: See HCP Within 4 Hours (Or PCP Triage)  Patient/caregiver understands and will follow disposition?: No  Copied from CRM #[id number]. Topic: Clinical - Red Word Triage >> Jul 19, 2024 11:09 AM Mia F wrote: Red Word that prompted transfer to Nurse Triage: Stomach virus symptoms. Pt says she has had bad diarrhea since yesterday. She says everything she eat is coming right out of her. She said she had chills and had a fever but seems to be gone now. Looking for someone to tell her what to do Reason for Disposition  [1] SEVERE diarrhea (e.g., 7 or more times / day more than normal) AND [2] age > 60 years  Answer Assessment - Initial Assessment Questions 1. DIARRHEA SEVERITY: How bad is the diarrhea? How many more stools have you had in the past 24 hours than normal?      8 2. ONSET: When did the diarrhea begin?      yesterday 3. STOOL DESCRIPTION:  How loose or watery is the diarrhea? What is the stool color? Is there any blood or mucous in the stool?     Brown water 4. VOMITING: Are you also vomiting? If Yes, ask: How many times in the past 24 hours?      denies 5. ABDOMEN PAIN: Are you having any abdomen pain? If Yes, ask: What does it feel like? (e.g., crampy, dull, intermittent, constant)     Just a little bit yesterday 6. ABDOMEN PAIN SEVERITY: If present, ask: How bad is the pain?  (e.g., Scale 1-10; mild, moderate, or severe)     Yesterday-pt does not answer with number 7. ORAL INTAKE: If vomiting, Have you been able to drink liquids? How much liquids have you had in the past 24 hours?     States that she does not want water 8. HYDRATION: Any signs of  dehydration? (e.g., dry mouth [not just dry lips], too weak to stand, dizziness, new weight loss) When did you last urinate?     denies 9. EXPOSURE: Have you traveled to a foreign country recently? Have you been exposed to anyone with diarrhea? Could you have eaten any food that was spoiled?     denies 10. ANTIBIOTIC USE: Are you taking antibiotics now or have you taken antibiotics in the past 2 months?       denies 11. OTHER SYMPTOMS: Do you have any other symptoms? (e.g., fever, blood in stool)       Chills yesterday  Protocols used: Diarrhea-A-AH

## 2024-07-19 NOTE — Telephone Encounter (Signed)
 FYI Called and discussed with patient. Advised need to go to UC to be evaluated. Patient stated that she did not feel that was necessary and would prefer to try OTC imodium first and then if did not help her, she did agree to go to UC. She is having about 4 bowel movements a day but she is able eat and drink. She does not have a fever.

## 2024-07-19 NOTE — Telephone Encounter (Signed)
 Patient scheduled for general surgery July 27, 2024- left arm   Due to unusual nature and depth, this may not be a skin lesion and therefore -- Recommend referral to general surgeon for removal due papule being deeper than skin

## 2024-07-19 NOTE — Telephone Encounter (Signed)
 Pt notified and verbalized understanding.

## 2024-07-19 NOTE — Telephone Encounter (Signed)
 Ok to take pepto bismol. Let her know that this may turn her stool black.  Also, stay hydrated and advance diet slowly.  Again, needs evaluation if symptoms persists.

## 2024-07-27 ENCOUNTER — Ambulatory Visit: Admitting: General Surgery

## 2024-07-28 DIAGNOSIS — Z79899 Other long term (current) drug therapy: Secondary | ICD-10-CM | POA: Diagnosis not present

## 2024-08-07 ENCOUNTER — Ambulatory Visit (INDEPENDENT_AMBULATORY_CARE_PROVIDER_SITE_OTHER): Admitting: Internal Medicine

## 2024-08-07 ENCOUNTER — Encounter: Payer: Self-pay | Admitting: Internal Medicine

## 2024-08-07 VITALS — BP 118/68 | HR 75 | Temp 98.2°F | Ht 64.0 in | Wt 93.4 lb

## 2024-08-07 DIAGNOSIS — E7800 Pure hypercholesterolemia, unspecified: Secondary | ICD-10-CM | POA: Diagnosis not present

## 2024-08-07 DIAGNOSIS — Z8673 Personal history of transient ischemic attack (TIA), and cerebral infarction without residual deficits: Secondary | ICD-10-CM | POA: Diagnosis not present

## 2024-08-07 DIAGNOSIS — G8320 Monoplegia of upper limb affecting unspecified side: Secondary | ICD-10-CM

## 2024-08-07 DIAGNOSIS — I10 Essential (primary) hypertension: Secondary | ICD-10-CM

## 2024-08-07 DIAGNOSIS — E039 Hypothyroidism, unspecified: Secondary | ICD-10-CM | POA: Diagnosis not present

## 2024-08-07 DIAGNOSIS — F320 Major depressive disorder, single episode, mild: Secondary | ICD-10-CM

## 2024-08-07 DIAGNOSIS — M0600 Rheumatoid arthritis without rheumatoid factor, unspecified site: Secondary | ICD-10-CM | POA: Diagnosis not present

## 2024-08-07 DIAGNOSIS — E559 Vitamin D deficiency, unspecified: Secondary | ICD-10-CM

## 2024-08-07 DIAGNOSIS — R053 Chronic cough: Secondary | ICD-10-CM

## 2024-08-07 DIAGNOSIS — K219 Gastro-esophageal reflux disease without esophagitis: Secondary | ICD-10-CM

## 2024-08-07 MED ORDER — PRAVASTATIN SODIUM 10 MG PO TABS: 10.0000 mg | ORAL_TABLET | Freq: Every day | ORAL | 1 refills | Status: DC

## 2024-08-07 NOTE — Progress Notes (Signed)
 Subjective:    Patient ID: Donna Rhodes, female    DOB: August 15, 1946, 78 y.o.   MRN: [id number]  Patient here for  Chief Complaint  Patient presents with   Medical Management of Chronic Issues    3 mo f/u    HPI Here for a scheduled follow up - follow up regarding increased stress, hypothyroidism and hypercholesterolemia. Is s/p recent bronchoscopy - persistent mucus plugging with pseudomonas infections. Concern regarding methotrexate  affecting the lungs. Had f/u with rheumatology 06/20/24 - recommended to stop methotrexate  and proceeding with arava. She has stopped arava. Off two weeks. Reports rheumatology is aware and plans f/u with them. She is also off lipitor. Discussed treatment for her cholesterol. Denies any chest pain. Breathing stable. Occasional cough. No abdominal pain reported.    Past Medical History:  Diagnosis Date   Actinic keratosis    Apical lung scarring    Atrophic vaginitis    Chronic anticoagulation    Dysplasia of vagina    vin 1 - vulva and vaginal cuff   Former cigarette smoker    Frequent falls    GERD (gastroesophageal reflux disease)    HCV infection    Hemiparesis affecting left side as late effect of stroke (HCC)    History of basal cell carcinoma (BCC) 04/11/2019   right lat lower eyelid margin Mohs 2020 Dr. 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/24/2017   Thyroid  nodule    pt unaware of this    Unsteady gait when walking    Vaginal Pap smear, abnormal    lgsil   Past Surgical History:  Procedure  Laterality Date   ADENOIDECTOMY     APPENDECTOMY  1965   BRONCHIAL WASHINGS N/A 02/25/2024   Procedure: BRONCHIAL WASHINGS;  Surgeon: Parris Manna, MD;  Location: [facility];  Service: Thoracic;  Laterality: N/A;   BUNIONECTOMY  1994   left   CATARACT EXTRACTION W/PHACO Right 10/27/2017   Procedure: CATARACT EXTRACTION PHACO AND INTRAOCULAR LENS PLACEMENT (IOC) RIGHT;  Surgeon: Mittie Gaskin, MD;  Location: [facility];  Service: Ophthalmology;  Laterality: Right;  requests early (8-8:30 arrival)   CATARACT EXTRACTION W/PHACO Left 11/10/2017   Procedure: CATARACT EXTRACTION PHACO AND INTRAOCULAR LENS PLACEMENT (IOC) LEFT;  Surgeon: Mittie Gaskin, MD;  Location: [facility];  Service: Ophthalmology;  Laterality: Left;   CHOLECYSTECTOMY     FINGER SURGERY  1982   FLEXIBLE BRONCHOSCOPY N/A 02/25/2024   Procedure: FLEXIBLE BRONCHOSCOPY;  Surgeon: Parris Manna, MD;  Location: [facility];  Service: Thoracic;  Laterality: N/A;   laser vein surgery     LOOP RECORDER INSERTION N/A 09/24/2017   Procedure: LOOP RECORDER INSERTION;  Surgeon: Waddell Danelle ORN, MD;  Location: [facility];  Service: Cardiovascular;  Laterality: N/A;   LOOP RECORDER REMOVAL     MANDIBLE RECONSTRUCTION  1992   TOENAIL EXCISION  1998   several   TONSILLECTOMY     TUBAL LIGATION  1977   VAGINAL HYSTERECTOMY  1983   als0- anterior/posterior colporrhaphy   VAGINAL WOUND CLOSURE / REPAIR  1999, 2000   VAGINAL WOUND CLOSURE / REPAIR     Family History  Problem Relation Age of Onset   Hypertension Father    Dementia Father    Osteoarthritis Mother    Arthritis/Rheumatoid Sister    Breast cancer Other        paternal side   Breast cancer Paternal Aunt    Breast cancer Cousin        maternal   Uterine cancer Maternal Grandmother    Ovarian cancer Neg Hx    Heart disease Neg Hx    Diabetes Neg Hx    Colon cancer Neg Hx    Social History   Socioeconomic History   Marital  status: Divorced    Spouse name: Not on file   Number of children: 3   Years of education: 12   Highest education level: Not on file  Occupational History    Comment: retired  Tobacco Use   Smoking status: Former    Current packs/day: 0.00    Types: Cigarettes    Quit date: 12/29/1979    Years since quitting: 44.6   Smokeless tobacco: Never  Vaping Use   Vaping status: Never Used  Substance and Sexual Activity   Alcohol use: No    Alcohol/week: 0.0 standard drinks of alcohol   Drug use: No   Sexual activity: Not Currently    Birth control/protection: Surgical  Other Topics Concern   Not on file  Social History Narrative   Her mother and her daughter live with her   Caffeine- 1 cup coffee   Social Drivers of Corporate investment banker Strain: Low Risk  (05/09/2024)   Received from [facility]   Overall Financial Resource Strain (CARDIA)    Difficulty of Paying Living Expenses: Not very hard  Food Insecurity: Food Insecurity Present (05/09/2024)   Received from [facility]   Hunger Vital Sign    Within the past 12 months, you worried that your food would run out before you got the money to buy more.: Sometimes true    Within the past 12 months, the food you bought just didn't last and you didn't have money to get more.: Sometimes true  Transportation Needs: No Transportation Needs (05/09/2024)   Received from [facility] - Transportation    In the past 12 months, has lack of transportation kept you from medical appointments or from getting medications?: No    Lack of Transportation (Non-Medical): No  Physical Activity: Not on file  Stress: No Stress Concern Present (02/24/2021)   [employer] of Occupational Health - Occupational Stress Questionnaire    Feeling of Stress : Only a little  Social Connections: Unknown (02/24/2021)   Social Connection and Isolation Panel    Frequency of Communication with  Friends and Family: More than three times a week    Frequency of Social Gatherings with Friends and Family: More than three times a week    Attends Religious Services: Not on Marketing executive or Organizations: Not on file    Attends Banker Meetings: Not on file    Marital Status: Not on file     Review of Systems  Constitutional:  Negative for appetite change and unexpected weight change.  HENT:  Negative for congestion and sinus pressure.  Respiratory:  Negative for cough, chest tightness and shortness of breath.   Cardiovascular:  Negative for chest pain, palpitations and leg swelling.  Gastrointestinal:  Negative for abdominal pain, diarrhea, nausea and vomiting.  Genitourinary:  Negative for difficulty urinating and dysuria.  Musculoskeletal:  Negative for myalgias.       Followed by rhematology for her joint issues.   Skin:  Negative for color change and rash.  Neurological:  Negative for dizziness and headaches.  Psychiatric/Behavioral:         Increased stress - caring for her mother.        Objective:     BP 118/68 (BP Location: Right Arm, Patient Position: Sitting, Cuff Size: Small)   Pulse 75   Temp 98.2 F (36.8 C) (Oral)   Ht 5' 4 (1.626 m)   Wt 93 lb 6.4 oz (42.4 kg)   LMP 12/29/1981   SpO2 99%   BMI 16.03 kg/m  Wt Readings from Last 3 Encounters:  08/08/24 93 lb 3.2 oz (42.3 kg)  08/07/24 93 lb 6.4 oz (42.4 kg)  05/04/24 95 lb 12.8 oz (43.5 kg)    Physical Exam Vitals reviewed.  Constitutional:      General: She is not in acute distress.    Appearance: Normal appearance.  HENT:     Head: Normocephalic and atraumatic.     Right Ear: External ear normal.     Left Ear: External ear normal.     Mouth/Throat:     Pharynx: No oropharyngeal exudate or posterior oropharyngeal erythema.  Eyes:     General: No scleral icterus.       Right eye: No discharge.        Left eye: No discharge.     Conjunctiva/sclera: Conjunctivae  normal.  Neck:     Thyroid : No thyromegaly.  Cardiovascular:     Rate and Rhythm: Normal rate and regular rhythm.  Pulmonary:     Effort: No respiratory distress.     Breath sounds: Normal breath sounds. No wheezing.  Abdominal:     General: Bowel sounds are normal.     Palpations: Abdomen is soft.     Tenderness: There is no abdominal tenderness.  Musculoskeletal:        General: No swelling or tenderness.     Cervical back: Neck supple. No tenderness.  Lymphadenopathy:     Cervical: No cervical adenopathy.  Skin:    Findings: No erythema or rash.  Neurological:     Mental Status: She is alert.  Psychiatric:        Mood and Affect: Mood normal.        Behavior: Behavior normal.         Outpatient Encounter Medications as of 08/07/2024  Medication Sig   acetaminophen  (TYLENOL ) 500 MG tablet Take 1-2 tablets (500-1,000 mg total) by mouth every 6 (six) hours as needed for mild pain.   acyclovir  (ZOVIRAX ) 200 MG capsule Take 1 capsule (200 mg total) by mouth 2 (two) times daily.   albuterol  (ACCUNEB ) 1.25 MG/3ML nebulizer solution Take by nebulization.   amLODipine  (NORVASC ) 2.5 MG tablet Take 1 tablet (2.5 mg total) by mouth daily.   Cholecalciferol (VITAMIN D3 PO) Take 1 tablet by mouth daily at 6 (six) AM.   clopidogrel  (PLAVIX ) 75 MG tablet TAKE 1 TABLET BY MOUTH DAILY (Patient taking differently: Take 75 mg by mouth daily with lunch.)   cyanocobalamin  (VITAMIN B12) 1000 MCG tablet Take 1,000 mcg by mouth daily.   ketoconazole  (  NIZORAL ) 2 % shampoo Apply 1 Application topically as needed.   levothyroxine  (SYNTHROID ) 50 MCG tablet Take 1 tablet (50 mcg total) by mouth daily. TAKE 1 TABLET EVERY DAY ON EMPTY STOMACHWITH A GLASS OF WATER AT LEAST 30-60 MINBEFORE BREAKFAST   pantoprazole  (PROTONIX ) 20 MG tablet Take 20 mg by mouth every morning. (Patient taking differently: Take 20 mg by mouth daily.)   pravastatin  (PRAVACHOL ) 10 MG tablet Take 1 tablet (10 mg total) by mouth  daily.   [DISCONTINUED] atorvastatin  (LIPITOR) 20 MG tablet TAKE 1 TABLET BY MOUTH DAILY (Patient taking differently: Take 20 mg by mouth 2 (two) times a week.)   [DISCONTINUED] folic acid  (FOLVITE ) 1 MG tablet Take 1 mg by mouth daily.   [DISCONTINUED] methotrexate  (RHEUMATREX) 2.5 MG tablet Take 10 mg by mouth once a week.   [DISCONTINUED] leflunomide (ARAVA) 10 MG tablet Take 10 mg by mouth daily.   No facility-administered encounter medications on file as of 08/07/2024.     Lab Results  Component Value Date   WBC 4.4 01/26/2024   HGB 12.2 01/26/2024   HCT 37.9 01/26/2024   PLT 299.0 01/26/2024   GLUCOSE 92 05/04/2024   CHOL 176 05/04/2024   TRIG 62.0 05/04/2024   HDL 64.50 05/04/2024   LDLDIRECT 128.0 05/25/2013   LDLCALC 99 05/04/2024   ALT 15 05/04/2024   AST 21 05/04/2024   NA 141 05/04/2024   K 4.0 05/04/2024   CL 106 05/04/2024   CREATININE 0.83 05/04/2024   BUN 20 05/04/2024   CO2 27 05/04/2024   TSH 1.18 10/20/2023   HGBA1C 5.7 (H) 04/25/2017    DG Chest Port 1 View Result Date: 02/25/2024 CLINICAL DATA:  Post bronchoscopy. EXAM: PORTABLE CHEST 1 VIEW COMPARISON:  Radiographs 10/22/2023 and 05/26/2017.  CT 02/02/2024. FINDINGS: 1321 hours. The heart size and mediastinal contours are stable. There are low lung volumes with probable patchy bibasilar atelectasis. The clustered nodularity seen at the right lung base on recent CT is not well visualized radiographically. There is no confluent airspace disease, pleural effusion or pneumothorax. No acute osseous findings are evident. Telemetry leads overlie the chest. IMPRESSION: Low lung volumes with probable patchy bibasilar atelectasis. No evidence of pneumothorax following bronchoscopy. Electronically Signed   By: Elsie Perone M.D.   On: 02/25/2024 17:15       Assessment & Plan:  Vitamin D  deficiency Assessment & Plan: Vitamin D  level wnl - 09/2023.    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,  involving unspecified site [facility]) Assessment & Plan: Seeing rheumatology. She stopped her methotrexate .  Concern regarding methotrexate  affecting the lungs. Had f/u with rheumatology 06/20/24 - recommended to stop methotrexate  and proceeding with arava. She has stopped arava. Off two weeks. Reports rheumatology is aware and plans f/u with them.   Persistent cough Assessment & Plan: CT chest 01/2024 -  No discrete suspicious mass or lymphadenopathy identified in the chest, abdomen or pelvis. Mucoid impaction in the lingula, right middle lobe and right lower lobe with clustered nodularity in the right lower lobe and right middle lobe, likely reflecting atypical infection. Additional scattered solid bilateral pulmonary nodules measure up to 4 mm in the right lower lobe, favored benign but nonspecific. Suggest follow-up chest CT in 3 months. Was referred to pulmonary for further evaluation and w/up. Currently being followed by Dr Parris.    Major depressive disorder, single episode, mild (HCC) Assessment & Plan: Discussed increased stress. Still with increased stress - mother still living with her. Increased stress  related to this. Notify me if feels needs further intervention.    Hypothyroidism, unspecified type Assessment & Plan: On thyroid  replacement. Follow tsh.    Hypercholesterolemia Assessment & Plan: Off lipitor. Discussed trial of a different medication. Trial pravastatin  - low dose. Follow lipid panel and liver function tests. Lab Results  Component Value Date   CHOL 176 05/04/2024   HDL 64.50 05/04/2024   LDLCALC 99 05/04/2024   LDLDIRECT 128.0 05/25/2013   TRIG 62.0 05/04/2024   CHOLHDL 3 05/04/2024      History of CVA (cerebrovascular accident) Assessment & Plan: Continue plavix  and risk factor modification.    Gastroesophageal reflux disease, unspecified whether esophagitis present Assessment & Plan: Continues protonix . No upper symptoms reported.    Flaccid  monoplegia of upper extremity (HCC) Assessment & Plan: S/p CVA. Continue exercise. Follow.    Essential hypertension Assessment & Plan: Continue amlodipine . Follow pressures. Follow metabolic panel. No change in medication today.    Other orders -     Pravastatin  Sodium; Take 1 tablet (10 mg total) by mouth daily.  Dispense: 90 tablet; Refill: 1     Allena Hamilton, MD

## 2024-08-08 ENCOUNTER — Encounter: Payer: Self-pay | Admitting: General Surgery

## 2024-08-08 ENCOUNTER — Ambulatory Visit: Admitting: General Surgery

## 2024-08-08 VITALS — BP 130/78 | HR 81 | Ht 64.0 in | Wt 93.2 lb

## 2024-08-08 DIAGNOSIS — R2232 Localized swelling, mass and lump, left upper limb: Secondary | ICD-10-CM | POA: Diagnosis not present

## 2024-08-08 NOTE — Patient Instructions (Signed)
 We will see you back in 6 months, our schedule is not available, so we placed you in our recall system, and will send you out a letter with a date and time to come see Dr Marinda. In the meantime if you have any questions or concerns please give our office a call

## 2024-08-13 ENCOUNTER — Encounter: Payer: Self-pay | Admitting: Internal Medicine

## 2024-08-13 NOTE — Assessment & Plan Note (Signed)
 Discussed increased stress. Still with increased stress - mother still living with her. Increased stress related to this. Notify me if feels needs further intervention.

## 2024-08-13 NOTE — Assessment & Plan Note (Signed)
 S/p CVA. Continue exercise. Follow.

## 2024-08-13 NOTE — Assessment & Plan Note (Signed)
 CT chest 01/2024 -  No discrete suspicious mass or lymphadenopathy identified in the chest, abdomen or pelvis. Mucoid impaction in the lingula, right middle lobe and right lower lobe with clustered nodularity in the right lower lobe and right middle lobe, likely reflecting atypical infection. Additional scattered solid bilateral pulmonary nodules measure up to 4 mm in the right lower lobe, favored benign but nonspecific. Suggest follow-up chest CT in 3 months. Was referred to pulmonary for further evaluation and w/up. Currently being followed by Dr Parris.

## 2024-08-13 NOTE — Assessment & Plan Note (Signed)
 Continues protonix . No upper symptoms reported.

## 2024-08-13 NOTE — Assessment & Plan Note (Signed)
 Vitamin D  level wnl - 09/2023.

## 2024-08-13 NOTE — Assessment & Plan Note (Signed)
 Continue amlodipine . Follow pressures. Follow metabolic panel. No change in medication today.

## 2024-08-13 NOTE — Assessment & Plan Note (Signed)
 Continue plavix and risk factor modification

## 2024-08-13 NOTE — Assessment & Plan Note (Signed)
 Off lipitor. Discussed trial of a different medication. Trial pravastatin  - low dose. Follow lipid panel and liver function tests. Lab Results  Component Value Date   CHOL 176 05/04/2024   HDL 64.50 05/04/2024   LDLCALC 99 05/04/2024   LDLDIRECT 128.0 05/25/2013   TRIG 62.0 05/04/2024   CHOLHDL 3 05/04/2024

## 2024-08-13 NOTE — Assessment & Plan Note (Signed)
 Seeing rheumatology. She stopped her methotrexate .  Concern regarding methotrexate  affecting the lungs. Had f/u with rheumatology 06/20/24 - recommended to stop methotrexate  and proceeding with arava. She has stopped arava. Off two weeks. Reports rheumatology is aware and plans f/u with them.

## 2024-08-13 NOTE — Assessment & Plan Note (Signed)
 On thyroid replacement.  Follow tsh.

## 2024-08-14 NOTE — Progress Notes (Signed)
 Patient ID: Donna Rhodes, female   DOB: [birth year], 78 y.o.   MRN: [id number] CC: Left Upper Extremity Mass History of Present Illness Donna Rhodes is a 78 y.o. female with past medical history as below who presents consultation for left upper extremity mass.  The patient reports that she was at her dermatologist and and she expressed concern about a left proximal triceps mass.  She says that she has had it for some time and does not feel like it is grown significantly over the last several months.  She does not have any pain to it.  She denies any overlying skin changes or discharge from the area.   Past Medical History Past Medical History:  Diagnosis Date   Actinic keratosis    Apical lung scarring    Atrophic vaginitis    Chronic anticoagulation    Dysplasia of vagina    vin 1 - vulva and vaginal cuff   Former cigarette smoker    Frequent falls    GERD (gastroesophageal reflux disease)    HCV infection    Hemiparesis affecting left side as late effect of stroke (HCC)    History of basal cell carcinoma (BCC) 04/11/2019   right lat lower eyelid margin Mohs 2020 Dr. 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/24/2017   Thyroid  nodule    pt unaware of this    Unsteady gait when walking    Vaginal Pap smear, abnormal    lgsil       Past Surgical History:  Procedure Laterality Date   ADENOIDECTOMY     APPENDECTOMY  1965   BRONCHIAL WASHINGS N/A 02/25/2024   Procedure: BRONCHIAL WASHINGS;  Surgeon: Parris Manna, MD;  Location: [facility];   Service: Thoracic;  Laterality: N/A;   BUNIONECTOMY  1994   left   CATARACT EXTRACTION W/PHACO Right 10/27/2017   Procedure: CATARACT EXTRACTION PHACO AND INTRAOCULAR LENS PLACEMENT (IOC) RIGHT;  Surgeon: Mittie Gaskin, MD;  Location: [facility];  Service: Ophthalmology;  Laterality: Right;  requests early (8-8:30 arrival)   CATARACT EXTRACTION W/PHACO Left 11/10/2017   Procedure: CATARACT EXTRACTION PHACO AND INTRAOCULAR LENS PLACEMENT (IOC) LEFT;  Surgeon: Mittie Gaskin, MD;  Location: [facility];  Service: Ophthalmology;  Laterality: Left;   CHOLECYSTECTOMY     FINGER SURGERY  1982   FLEXIBLE BRONCHOSCOPY N/A 02/25/2024   Procedure: FLEXIBLE BRONCHOSCOPY;  Surgeon: Parris Manna, MD;  Location: [facility];  Service: Thoracic;  Laterality: N/A;   laser vein surgery     LOOP RECORDER INSERTION N/A 09/24/2017   Procedure: LOOP RECORDER INSERTION;  Surgeon: Waddell Danelle ORN, MD;  Location: [facility];  Service: Cardiovascular;  Laterality: N/A;   LOOP RECORDER REMOVAL     MANDIBLE RECONSTRUCTION  1992   TOENAIL EXCISION  1998   several   TONSILLECTOMY     TUBAL LIGATION  1977   VAGINAL HYSTERECTOMY  1983   als0- anterior/posterior colporrhaphy  VAGINAL WOUND CLOSURE / REPAIR  1999, 2000   VAGINAL WOUND CLOSURE / REPAIR      Allergies  Allergen Reactions   Amoxicillin  Other (See Comments)    Has patient had a PCN reaction causing immediate rash, facial/tongue/throat swelling, SOB or lightheadedness with hypotension: No Has patient had a PCN reaction causing severe rash involving mucus membranes or skin necrosis: No Has patient had a PCN reaction that required hospitalization: No Has patient had a PCN reaction occurring within the last 10 years: No If all of the above answers are NO, then may proceed with Cephalosporin use.   Irritated tongue   Avelox  [Moxifloxacin  Hcl In Nacl]     Tongue irritation   Azithromycin Other (See Comments)     Caused mouth and tongue to be sore   Bacitracin     Itching and rash    Codeine     feels like I'm in a barrel   Estro[employer] Weight [employer] [Nutritional Supplements]     Mouth soreness   Fish Oil     Other reaction(s): Other (See Comments) Mouth soreness Mouth soreness   Food     Walnuts --mouth breaks out   Moxifloxacin  Other (See Comments)    Tongue irritation   Naproxen     Tongue irritation   Nsaids Other (See Comments)    Oral inflammation   Cefepime  Rash    Current Outpatient Medications  Medication Sig Dispense Refill   pantoprazole  (PROTONIX ) 20 MG tablet Take 20 mg by mouth every morning. (Patient taking differently: Take 20 mg by mouth daily.)     acetaminophen  (TYLENOL ) 500 MG tablet Take 1-2 tablets (500-1,000 mg total) by mouth every 6 (six) hours as needed for mild pain. 30 tablet 0   acyclovir  (ZOVIRAX ) 200 MG capsule Take 1 capsule (200 mg total) by mouth 2 (two) times daily. 180 capsule 3   albuterol  (ACCUNEB ) 1.25 MG/3ML nebulizer solution Take by nebulization.     amLODipine  (NORVASC ) 2.5 MG tablet Take 1 tablet (2.5 mg total) by mouth daily. 90 tablet 1   Cholecalciferol (VITAMIN D3 PO) Take 1 tablet by mouth daily at 6 (six) AM.     clopidogrel  (PLAVIX ) 75 MG tablet TAKE 1 TABLET BY MOUTH DAILY (Patient taking differently: Take 75 mg by mouth daily with lunch.) 90 tablet 1   cyanocobalamin  (VITAMIN B12) 1000 MCG tablet Take 1,000 mcg by mouth daily.     ketoconazole  (NIZORAL ) 2 % shampoo Apply 1 Application topically as needed. 120 mL 11   levothyroxine  (SYNTHROID ) 50 MCG tablet Take 1 tablet (50 mcg total) by mouth daily. TAKE 1 TABLET EVERY DAY ON EMPTY STOMACHWITH A GLASS OF WATER AT LEAST 30-60 MINBEFORE BREAKFAST 90 tablet 1   pravastatin  (PRAVACHOL ) 10 MG tablet Take 1 tablet (10 mg total) by mouth daily. 90 tablet 1   No current facility-administered medications for this visit.    Family History Family History  Problem Relation Age of Onset    Hypertension Father    Dementia Father    Osteoarthritis Mother    Arthritis/Rheumatoid Sister    Breast cancer Other        paternal side   Breast cancer Paternal Aunt    Breast cancer Cousin        maternal   Uterine cancer Maternal Grandmother    Ovarian cancer Neg Hx    Heart disease Neg Hx    Diabetes Neg Hx    Colon cancer Neg Hx  Social History Social History   Tobacco Use   Smoking status: Former    Current packs/day: 0.00    Types: Cigarettes    Quit date: 12/29/1979    Years since quitting: 44.6   Smokeless tobacco: Never  Vaping Use   Vaping status: Never Used  Substance Use Topics   Alcohol use: No    Alcohol/week: 0.0 standard drinks of alcohol   Drug use: No        ROS Full ROS of systems performed and is otherwise negative there than what is stated in the HPI  Physical Exam Blood pressure 130/78, pulse 81, height 5' 4 (1.626 m), weight 93 lb 3.2 oz (42.3 kg), last menstrual period 12/29/1981, SpO2 99%.  Alert and oriented x 3, normal work of breathing room air, regular rate and rhythm, abdomen soft, nontender nondistended, left upper extremity there is a very small nodule that is deep to the skin.  This is mobile and nontender to touch.  There is no overlying skin changes.  Data Reviewed Last labs reviewed and significant for normal creatinine.  I have personally reviewed the patient's imaging and medical records.    Assessment    78 year old lady with left upper extremity nodule.  I discussed with her the differential is epidermal inclusion cyst, tendon sheath tumor or cyst.  I discussed with her that it is quite small and she says that it has not been growing significantly over the last several months.  I offered her excision of this or we could wait just to see if it grows.  At this time she would not like to have excision.  We will plan to see her again in 6 months to make sure that the character of the mass has not changed significantly.  I  did discuss with her if it exhibits any larger growth, she starts to have pain from it or neurologic symptoms or if she has any overlying skin changes then she should follow-up in our office sooner.    Jayson MALVA Endow 08/14/2024, 10:36 AM

## 2024-09-07 ENCOUNTER — Telehealth: Payer: Self-pay | Admitting: Internal Medicine

## 2024-09-07 DIAGNOSIS — E039 Hypothyroidism, unspecified: Secondary | ICD-10-CM

## 2024-09-07 DIAGNOSIS — E7800 Pure hypercholesterolemia, unspecified: Secondary | ICD-10-CM

## 2024-09-07 NOTE — Telephone Encounter (Signed)
 Lab order needed

## 2024-09-21 ENCOUNTER — Ambulatory Visit: Payer: Self-pay | Admitting: Internal Medicine

## 2024-09-21 ENCOUNTER — Other Ambulatory Visit (INDEPENDENT_AMBULATORY_CARE_PROVIDER_SITE_OTHER)

## 2024-09-21 DIAGNOSIS — E7800 Pure hypercholesterolemia, unspecified: Secondary | ICD-10-CM | POA: Diagnosis not present

## 2024-09-21 DIAGNOSIS — E039 Hypothyroidism, unspecified: Secondary | ICD-10-CM | POA: Diagnosis not present

## 2024-09-21 LAB — HEPATIC FUNCTION PANEL
ALT: 11 U/L (ref 0–35)
AST: 20 U/L (ref 0–37)
Albumin: 4.1 g/dL (ref 3.5–5.2)
Alkaline Phosphatase: 87 U/L (ref 39–117)
Bilirubin, Direct: 0.1 mg/dL (ref 0.0–0.3)
Total Bilirubin: 0.5 mg/dL (ref 0.2–1.2)
Total Protein: 6.8 g/dL (ref 6.0–8.3)

## 2024-09-21 LAB — BASIC METABOLIC PANEL WITH GFR
BUN: 16 mg/dL (ref 6–23)
CO2: 27 meq/L (ref 19–32)
Calcium: 9.2 mg/dL (ref 8.4–10.5)
Chloride: 107 meq/L (ref 96–112)
Creatinine, Ser: 0.68 mg/dL (ref 0.40–1.20)
GFR: 83.16 mL/min (ref >60.00)
Glucose, Bld: 75 mg/dL (ref 70–99)
Potassium: 4.3 meq/L (ref 3.5–5.1)
Sodium: 143 meq/L (ref 135–145)

## 2024-09-21 LAB — LIPID PANEL
Cholesterol: 161 mg/dL (ref 0–200)
HDL: 68 mg/dL (ref >39.00)
LDL Cholesterol: 82 mg/dL (ref 0–99)
NonHDL: 93.27
Total CHOL/HDL Ratio: 2
Triglycerides: 56 mg/dL (ref 0.0–149.0)
VLDL: 11.2 mg/dL (ref 0.0–40.0)

## 2024-09-21 LAB — TSH: TSH: 3.39 u[IU]/mL (ref 0.35–5.50)

## 2024-10-24 DIAGNOSIS — M069 Rheumatoid arthritis, unspecified: Secondary | ICD-10-CM | POA: Diagnosis not present

## 2024-10-24 DIAGNOSIS — J984 Other disorders of lung: Secondary | ICD-10-CM | POA: Diagnosis not present

## 2024-10-24 DIAGNOSIS — J479 Bronchiectasis, uncomplicated: Secondary | ICD-10-CM | POA: Diagnosis not present

## 2024-10-24 DIAGNOSIS — D849 Immunodeficiency, unspecified: Secondary | ICD-10-CM | POA: Diagnosis not present

## 2024-10-24 DIAGNOSIS — T17500A Unspecified foreign body in bronchus causing asphyxiation, initial encounter: Secondary | ICD-10-CM | POA: Diagnosis not present

## 2024-11-07 ENCOUNTER — Encounter: Payer: Self-pay | Admitting: Internal Medicine

## 2024-11-07 ENCOUNTER — Ambulatory Visit (INDEPENDENT_AMBULATORY_CARE_PROVIDER_SITE_OTHER): Admitting: Internal Medicine

## 2024-11-07 VITALS — BP 120/70 | HR 69 | Temp 98.1°F | Ht 64.0 in | Wt 94.6 lb

## 2024-11-07 DIAGNOSIS — E039 Hypothyroidism, unspecified: Secondary | ICD-10-CM | POA: Diagnosis not present

## 2024-11-07 DIAGNOSIS — M0600 Rheumatoid arthritis without rheumatoid factor, unspecified site: Secondary | ICD-10-CM

## 2024-11-07 DIAGNOSIS — Z8673 Personal history of transient ischemic attack (TIA), and cerebral infarction without residual deficits: Secondary | ICD-10-CM | POA: Diagnosis not present

## 2024-11-07 DIAGNOSIS — R87622 Low grade squamous intraepithelial lesion on cytologic smear of vagina (LGSIL): Secondary | ICD-10-CM | POA: Diagnosis not present

## 2024-11-07 DIAGNOSIS — E7800 Pure hypercholesterolemia, unspecified: Secondary | ICD-10-CM

## 2024-11-07 DIAGNOSIS — I10 Essential (primary) hypertension: Secondary | ICD-10-CM

## 2024-11-07 DIAGNOSIS — G8320 Monoplegia of upper limb affecting unspecified side: Secondary | ICD-10-CM

## 2024-11-07 DIAGNOSIS — R252 Cramp and spasm: Secondary | ICD-10-CM

## 2024-11-07 DIAGNOSIS — D649 Anemia, unspecified: Secondary | ICD-10-CM | POA: Diagnosis not present

## 2024-11-07 DIAGNOSIS — K219 Gastro-esophageal reflux disease without esophagitis: Secondary | ICD-10-CM

## 2024-11-07 MED ORDER — CLOPIDOGREL BISULFATE 75 MG PO TABS: 75.0000 mg | ORAL_TABLET | Freq: Every day | ORAL | 1 refills | Status: AC

## 2024-11-07 MED ORDER — AMLODIPINE BESYLATE 2.5 MG PO TABS
2.5000 mg | ORAL_TABLET | Freq: Every day | ORAL | 1 refills | Status: AC
Start: 2024-11-07 — End: ?

## 2024-11-07 MED ORDER — LEVOTHYROXINE SODIUM 50 MCG PO TABS
50.0000 ug | ORAL_TABLET | Freq: Every day | ORAL | 1 refills | Status: AC
Start: 2024-11-07 — End: ?

## 2024-11-07 NOTE — Patient Instructions (Signed)
 Magnesium glycinate

## 2024-11-07 NOTE — Progress Notes (Signed)
 Subjective:    Patient ID: Donna Rhodes, female    DOB: April 05, 1946, 78 y.o.   MRN: [id number]  Patient here for  Chief Complaint  Patient presents with   Medical Management of Chronic Issues    HPI Here for a scheduled follow up - follow up regarding hypertension, hypercholesterolemia and bronchiectasis. Saw pulmonary 10/24/24 - recommended chest CT, f/u with rheumatology and started on bactrim and prednisone  2.5mg  daily. Discussed this treatment. Questions answered. Breathing overall stable. No chest pain. No abdominal pain. Bowels moving. Eating. Still with increased stress. Daughter is doing better. Still has her mother living with her.    Past Medical History:  Diagnosis Date   Actinic keratosis    Apical lung scarring    Atrophic vaginitis    Chronic anticoagulation    Dysplasia of vagina    vin 1 - vulva and vaginal cuff   Former cigarette smoker    Frequent falls    GERD (gastroesophageal reflux disease)    HCV infection    Hemiparesis affecting left side as late effect of stroke (HCC)    History of basal cell carcinoma (BCC) 04/11/2019   right lat lower eyelid margin Mohs 2020 Dr. 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/24/2017   Thyroid  nodule    pt unaware of this    Unsteady gait when walking    Vaginal Pap smear, abnormal    lgsil   Past Surgical History:  Procedure Laterality Date   ADENOIDECTOMY     APPENDECTOMY  1965   BRONCHIAL WASHINGS N/A 02/25/2024   Procedure: BRONCHIAL WASHINGS;  Surgeon: Parris Manna, MD;   Location: [facility];  Service: Thoracic;  Laterality: N/A;   BUNIONECTOMY  1994   left   CATARACT EXTRACTION W/PHACO Right 10/27/2017   Procedure: CATARACT EXTRACTION PHACO AND INTRAOCULAR LENS PLACEMENT (IOC) RIGHT;  Surgeon: Mittie Gaskin, MD;  Location: [facility];  Service: Ophthalmology;  Laterality: Right;  requests early (8-8:30 arrival)   CATARACT EXTRACTION W/PHACO Left 11/10/2017   Procedure: CATARACT EXTRACTION PHACO AND INTRAOCULAR LENS PLACEMENT (IOC) LEFT;  Surgeon: Mittie Gaskin, MD;  Location: [facility];  Service: Ophthalmology;  Laterality: Left;   CHOLECYSTECTOMY     FINGER SURGERY  1982   FLEXIBLE BRONCHOSCOPY N/A 02/25/2024   Procedure: FLEXIBLE BRONCHOSCOPY;  Surgeon: Parris Manna, MD;  Location: [facility];  Service: Thoracic;  Laterality: N/A;   laser vein surgery     LOOP RECORDER INSERTION N/A 09/24/2017   Procedure: LOOP RECORDER INSERTION;  Surgeon: Waddell Danelle ORN, MD;  Location: [facility];  Service: Cardiovascular;  Laterality: N/A;   LOOP RECORDER REMOVAL     MANDIBLE RECONSTRUCTION  1992   TOENAIL EXCISION  1998   several   TONSILLECTOMY     TUBAL LIGATION  1977   VAGINAL HYSTERECTOMY  1983   als0- anterior/posterior colporrhaphy   VAGINAL WOUND CLOSURE / REPAIR  1999, 2000   VAGINAL  WOUND CLOSURE / REPAIR     Family History  Problem Relation Age of Onset   Hypertension Father    Dementia Father    Osteoarthritis Mother    Arthritis/Rheumatoid Sister    Breast cancer Other        paternal side   Breast cancer Paternal Aunt    Breast cancer Cousin        maternal   Uterine cancer Maternal Grandmother    Ovarian cancer Neg Hx    Heart disease Neg Hx    Diabetes Neg Hx    Colon cancer Neg Hx    Social History   Socioeconomic History   Marital status: Divorced    Spouse name: Not on file   Number of children: 3   Years of education: 12   Highest education level: Not on file  Occupational History     Comment: retired  Tobacco Use   Smoking status: Former    Current packs/day: 0.00    Types: Cigarettes    Quit date: 12/29/1979    Years since quitting: 44.9   Smokeless tobacco: Never  Vaping Use   Vaping status: Never Used  Substance and Sexual Activity   Alcohol use: No    Alcohol/week: 0.0 standard drinks of alcohol   Drug use: No   Sexual activity: Not Currently    Birth control/protection: Surgical  Other Topics Concern   Not on file  Social History Narrative   Her mother and her daughter live with her   Caffeine- 1 cup coffee   Social Drivers of Corporate Investment Banker Strain: Low Risk  (05/09/2024)   Received from [facility]   Overall Financial Resource Strain (CARDIA)    Difficulty of Paying Living Expenses: Not very hard  Food Insecurity: Food Insecurity Present (05/09/2024)   Received from [facility]   Hunger Vital Sign    Within the past 12 months, you worried that your food would run out before you got the money to buy more.: Sometimes true    Within the past 12 months, the food you bought just didn't last and you didn't have money to get more.: Sometimes true  Transportation Needs: No Transportation Needs (05/09/2024)   Received from [facility] - Transportation    In the past 12 months, has lack of transportation kept you from medical appointments or from getting medications?: No    Lack of Transportation (Non-Medical): No  Physical Activity: Not on file  Stress: No Stress Concern Present (02/24/2021)   [employer] of Occupational Health - Occupational Stress Questionnaire    Feeling of Stress : Only a little  Social Connections: Unknown (02/24/2021)   Social Connection and Isolation Panel    Frequency of Communication with Friends and Family: More than three times a week    Frequency of Social Gatherings with Friends and Family: More than three times a week    Attends Religious Services:  Not on Marketing Executive or Organizations: Not on file    Attends Banker Meetings: Not on file    Marital Status: Not on file     Review of Systems  Constitutional:  Negative for appetite change and unexpected weight change.  HENT:  Negative for congestion and sinus pressure.   Respiratory:  Negative for cough, chest tightness and shortness of breath.   Cardiovascular:  Negative for chest pain, palpitations and leg swelling.  Gastrointestinal:  Negative for abdominal pain, diarrhea, nausea and vomiting.  Genitourinary:  Negative for difficulty urinating and dysuria.  Musculoskeletal:  Negative for myalgias.  Skin:  Negative for color change and rash.  Neurological:  Negative for dizziness and headaches.  Psychiatric/Behavioral:  Negative for agitation and dysphoric mood.        Increased stress.        Objective:     BP 120/70   Pulse 69   Temp 98.1 F (36.7 C) (Oral)   Ht 5' 4 (1.626 m)   Wt 94 lb 9.6 oz (42.9 kg)   LMP 12/29/1981   SpO2 97%   BMI 16.24 kg/m  Wt Readings from Last 3 Encounters:  11/07/24 94 lb 9.6 oz (42.9 kg)  08/08/24 93 lb 3.2 oz (42.3 kg)  08/07/24 93 lb 6.4 oz (42.4 kg)    Physical Exam Vitals reviewed.  Constitutional:      General: She is not in acute distress.    Appearance: Normal appearance.  HENT:     Head: Normocephalic and atraumatic.     Right Ear: External ear normal.     Left Ear: External ear normal.     Mouth/Throat:     Pharynx: No oropharyngeal exudate or posterior oropharyngeal erythema.  Eyes:     General: No scleral icterus.       Right eye: No discharge.        Left eye: No discharge.     Conjunctiva/sclera: Conjunctivae normal.  Neck:     Thyroid : No thyromegaly.  Cardiovascular:     Rate and Rhythm: Normal rate and regular rhythm.  Pulmonary:     Effort: No respiratory distress.     Breath sounds: Normal breath sounds. No wheezing.  Abdominal:     General: Bowel sounds are normal.      Palpations: Abdomen is soft.     Tenderness: There is no abdominal tenderness.  Musculoskeletal:        General: No swelling or tenderness.     Cervical back: Neck supple. No tenderness.  Lymphadenopathy:     Cervical: No cervical adenopathy.  Skin:    Findings: No erythema or rash.  Neurological:     Mental Status: She is alert.  Psychiatric:        Mood and Affect: Mood normal.        Behavior: Behavior normal.         Outpatient Encounter Medications as of 11/07/2024  Medication Sig   acetaminophen  (TYLENOL ) 500 MG tablet Take 1-2 tablets (500-1,000 mg total) by mouth every 6 (six) hours as needed for mild pain.   acyclovir  (ZOVIRAX ) 200 MG capsule Take 1 capsule (200 mg total) by mouth 2 (two) times daily.   albuterol  (ACCUNEB ) 1.25 MG/3ML nebulizer solution Take by nebulization.   Cholecalciferol (VITAMIN D3 PO) Take 1 tablet by mouth daily at 6 (six) AM.   cyanocobalamin  (VITAMIN B12) 1000 MCG tablet Take 1,000 mcg by mouth daily.   ketoconazole  (NIZORAL ) 2 % shampoo Apply 1 Application topically as needed.   pantoprazole  (PROTONIX ) 20 MG tablet Take 20 mg by mouth every morning. (Patient taking differently: Take 20 mg by mouth daily.)   pravastatin  (PRAVACHOL ) 10 MG tablet Take 1 tablet (10 mg total) by mouth daily.   predniSONE  (DELTASONE ) 2.5 MG tablet Take 2.5 mg by mouth.   sulfamethoxazole-trimethoprim (BACTRIM) 400-80 MG tablet Take by mouth.   amLODipine  (NORVASC ) 2.5 MG tablet Take 1 tablet (2.5 mg total) by mouth daily.  clopidogrel  (PLAVIX ) 75 MG tablet Take 1 tablet (75 mg total) by mouth daily with lunch.   levothyroxine  (SYNTHROID ) 50 MCG tablet Take 1 tablet (50 mcg total) by mouth daily. TAKE 1 TABLET EVERY DAY ON EMPTY STOMACHWITH A GLASS OF WATER AT LEAST 30-60 MINBEFORE BREAKFAST   [DISCONTINUED] amLODipine  (NORVASC ) 2.5 MG tablet Take 1 tablet (2.5 mg total) by mouth daily.   [DISCONTINUED] clopidogrel  (PLAVIX ) 75 MG tablet TAKE 1 TABLET BY MOUTH  DAILY (Patient taking differently: Take 75 mg by mouth daily with lunch.)   [DISCONTINUED] levothyroxine  (SYNTHROID ) 50 MCG tablet Take 1 tablet (50 mcg total) by mouth daily. TAKE 1 TABLET EVERY DAY ON EMPTY STOMACHWITH A GLASS OF WATER AT LEAST 30-60 MINBEFORE BREAKFAST   No facility-administered encounter medications on file as of 11/07/2024.     Lab Results  Component Value Date   WBC 4.4 01/26/2024   HGB 12.2 01/26/2024   HCT 37.9 01/26/2024   PLT 299.0 01/26/2024   GLUCOSE 90 11/07/2024   CHOL 161 09/21/2024   TRIG 56.0 09/21/2024   HDL 68.00 09/21/2024   LDLDIRECT 128.0 05/25/2013   LDLCALC 82 09/21/2024   ALT 16 11/07/2024   AST 24 11/07/2024   NA 138 11/07/2024   K 4.4 11/07/2024   CL 103 11/07/2024   CREATININE 0.92 11/07/2024   BUN 16 11/07/2024   CO2 27 11/07/2024   TSH 3.39 09/21/2024   HGBA1C 5.7 (H) 04/25/2017    DG Chest Port 1 View Result Date: 02/25/2024 CLINICAL DATA:  Post bronchoscopy. EXAM: PORTABLE CHEST 1 VIEW COMPARISON:  Radiographs 10/22/2023 and 05/26/2017.  CT 02/02/2024. FINDINGS: 1321 hours. The heart size and mediastinal contours are stable. There are low lung volumes with probable patchy bibasilar atelectasis. The clustered nodularity seen at the right lung base on recent CT is not well visualized radiographically. There is no confluent airspace disease, pleural effusion or pneumothorax. No acute osseous findings are evident. Telemetry leads overlie the chest. IMPRESSION: Low lung volumes with probable patchy bibasilar atelectasis. No evidence of pneumothorax following bronchoscopy. Electronically Signed   By: Elsie Perone M.D.   On: 02/25/2024 17:15       Assessment & Plan:  Leg cramps -     Magnesium   Hypercholesterolemia Assessment & Plan: Off lipitor. Now on pravastatin  - low dose. Follow lipid panel and liver function tests. Discussed given history of CVA - recommend statin therapy. Continue risk factor modification.  Lab Results   Component Value Date   CHOL 161 09/21/2024   HDL 68.00 09/21/2024   LDLCALC 82 09/21/2024   LDLDIRECT 128.0 05/25/2013   TRIG 56.0 09/21/2024   CHOLHDL 2 09/21/2024     Orders: -     Basic metabolic panel with GFR; Future -     Lipid panel; Future -     Hepatic function panel; Future -     Comprehensive metabolic panel with GFR  Anemia, unspecified type -     CBC with Differential/Platelet; Future  Essential hypertension Assessment & Plan: Continue amlodipine . Follow pressures. No change in medication today. Follow metabolic panel.   Orders: -     Comprehensive metabolic panel with GFR  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, involving unspecified site [facility]) Assessment & Plan: Seeing rheumatology. She stopped her methotrexate .  Concern regarding methotrexate  affecting the lungs. Had f/u with rheumatology 06/20/24 - recommended to stop methotrexate  and proceeding with arava. She has stopped arava. Continue f/u with rheumatology. Appears stable.    LGSIL Pap smear of  vagina Assessment & Plan: Has been followed by gyn.  Recently evaluated by Dr Janit.  Per gyn: Abnormal Paps of the vaginal cuff were discussed. Patient previously had LGSIL but a negative colposcopy.  Based on her low risk they discussed the possibility of a repeat Pap or simply considering her aged out of Pap smears because her risk of vaginal cancer is very low.  Per review of note, they reported she would like consider these options.  Have discussed f/u pap.  She will notify gyn if desires.    Hypothyroidism, unspecified type Assessment & Plan: On thyroid  replacement. Follow tsh.    History of CVA (cerebrovascular accident) Assessment & Plan: Continue plavix  and risk factor modification.    Gastroesophageal reflux disease, unspecified whether esophagitis present Assessment & Plan: No upper symptoms reported. Continue protonix .    Flaccid monoplegia of upper extremity (HCC) Assessment &  Plan: S/p CVA. Continue stretches/exercise. Follow.    Other orders -     amLODIPine  Besylate; Take 1 tablet (2.5 mg total) by mouth daily.  Dispense: 90 tablet; Refill: 1 -     Clopidogrel  Bisulfate; Take 1 tablet (75 mg total) by mouth daily with lunch.  Dispense: 90 tablet; Refill: 1 -     Levothyroxine  Sodium; Take 1 tablet (50 mcg total) by mouth daily. TAKE 1 TABLET EVERY DAY ON EMPTY STOMACHWITH A GLASS OF WATER AT LEAST 30-60 MINBEFORE BREAKFAST  Dispense: 90 tablet; Refill: 1     Allena Hamilton, MD

## 2024-11-08 LAB — COMPREHENSIVE METABOLIC PANEL WITH GFR
ALT: 16 U/L (ref 0–35)
AST: 24 U/L (ref 0–37)
Albumin: 4.3 g/dL (ref 3.5–5.2)
Alkaline Phosphatase: 80 U/L (ref 39–117)
BUN: 16 mg/dL (ref 6–23)
CO2: 27 meq/L (ref 19–32)
Calcium: 9 mg/dL (ref 8.4–10.5)
Chloride: 103 meq/L (ref 96–112)
Creatinine, Ser: 0.92 mg/dL (ref 0.40–1.20)
GFR: 59.44 mL/min — ABNORMAL LOW (ref >60.00)
Glucose, Bld: 90 mg/dL (ref 70–99)
Potassium: 4.4 meq/L (ref 3.5–5.1)
Sodium: 138 meq/L (ref 135–145)
Total Bilirubin: 0.4 mg/dL (ref 0.2–1.2)
Total Protein: 7.5 g/dL (ref 6.0–8.3)

## 2024-11-08 LAB — MAGNESIUM: Magnesium: 2.2 mg/dL (ref 1.5–2.5)

## 2024-11-09 ENCOUNTER — Ambulatory Visit: Payer: Self-pay | Admitting: Internal Medicine

## 2024-11-09 ENCOUNTER — Other Ambulatory Visit: Payer: Self-pay | Admitting: *Deleted

## 2024-11-09 DIAGNOSIS — E7800 Pure hypercholesterolemia, unspecified: Secondary | ICD-10-CM

## 2024-11-09 NOTE — Telephone Encounter (Signed)
 Copied from CRM [id number]. Topic: Clinical - Lab/Test Results >> Nov 09, 2024  4:30 PM Rea ORN wrote: Reason for CRM: Pt returned call from [place]oya. Pt was given lab results and a repeat lab appt was scheduled for 11/21/24.

## 2024-11-13 ENCOUNTER — Encounter: Payer: Self-pay | Admitting: Internal Medicine

## 2024-11-13 NOTE — Assessment & Plan Note (Signed)
 S/p CVA. Continue stretches/exercise. Follow.

## 2024-11-13 NOTE — Assessment & Plan Note (Signed)
Has been followed by gyn.  Recently evaluated by Dr Evans.  Per gyn: Abnormal Paps of the vaginal cuff were discussed. Patient previously had LGSIL but a negative colposcopy.  Based on her low risk they discussed the possibility of a repeat Pap or simply considering her aged out of Pap smears because her risk of vaginal cancer is very low.  Per review of note, they reported she would like consider these options.  Have discussed f/u pap.  She will notify gyn if desires.  

## 2024-11-13 NOTE — Assessment & Plan Note (Signed)
 Continue amlodipine . Follow pressures. No change in medication today. Follow metabolic panel.

## 2024-11-13 NOTE — Assessment & Plan Note (Signed)
 No upper symptoms reported.  Continue protonix.

## 2024-11-13 NOTE — Assessment & Plan Note (Signed)
 On thyroid replacement.  Follow tsh.

## 2024-11-13 NOTE — Assessment & Plan Note (Signed)
 Seeing rheumatology. She stopped her methotrexate .  Concern regarding methotrexate  affecting the lungs. Had f/u with rheumatology 06/20/24 - recommended to stop methotrexate  and proceeding with arava. She has stopped arava. Continue f/u with rheumatology. Appears stable.

## 2024-11-13 NOTE — Assessment & Plan Note (Signed)
 Off lipitor. Now on pravastatin  - low dose. Follow lipid panel and liver function tests. Discussed given history of CVA - recommend statin therapy. Continue risk factor modification.  Lab Results  Component Value Date   CHOL 161 09/21/2024   HDL 68.00 09/21/2024   LDLCALC 82 09/21/2024   LDLDIRECT 128.0 05/25/2013   TRIG 56.0 09/21/2024   CHOLHDL 2 09/21/2024

## 2024-11-13 NOTE — Assessment & Plan Note (Signed)
 Continue plavix and risk factor modification

## 2024-11-15 ENCOUNTER — Other Ambulatory Visit: Payer: Self-pay | Admitting: Internal Medicine

## 2024-11-21 ENCOUNTER — Other Ambulatory Visit (INDEPENDENT_AMBULATORY_CARE_PROVIDER_SITE_OTHER)

## 2024-11-21 ENCOUNTER — Telehealth: Payer: Self-pay | Admitting: Internal Medicine

## 2024-11-21 DIAGNOSIS — E7800 Pure hypercholesterolemia, unspecified: Secondary | ICD-10-CM

## 2024-11-21 LAB — BASIC METABOLIC PANEL WITH GFR
BUN: 14 mg/dL (ref 6–23)
CO2: 29 meq/L (ref 19–32)
Calcium: 8.9 mg/dL (ref 8.4–10.5)
Chloride: 105 meq/L (ref 96–112)
Creatinine, Ser: 0.82 mg/dL (ref 0.40–1.20)
GFR: 68.22 mL/min (ref >60.00)
Glucose, Bld: 94 mg/dL (ref 70–99)
Potassium: 4.2 meq/L (ref 3.5–5.1)
Sodium: 139 meq/L (ref 135–145)

## 2024-11-21 NOTE — Telephone Encounter (Signed)
 They were collected at the same time of this message

## 2024-11-21 NOTE — Telephone Encounter (Signed)
 Pt stated she would really like to hear back from her labs ASAP.

## 2024-11-22 ENCOUNTER — Ambulatory Visit: Payer: Self-pay | Admitting: Internal Medicine

## 2024-11-28 ENCOUNTER — Encounter: Payer: Self-pay | Admitting: Pharmacist

## 2024-11-28 NOTE — Progress Notes (Signed)
 Pharmacy Quality Measure Review  This patient is appearing on a report for being at risk of failing the adherence measure for cholesterol (statin) medications this calendar year.   Medication: pravastatin  10 mg Last fill date: 08/07/24 for 90 day supply  Insurance report was not up to date. No action needed at this time.  Medication has been refilled as of 11/19 x90 ds.

## 2024-12-12 ENCOUNTER — Telehealth: Payer: Self-pay

## 2024-12-12 NOTE — Telephone Encounter (Signed)
 Please call and confirm if she wants to stay at same practice and confirm practice.

## 2024-12-12 NOTE — Telephone Encounter (Signed)
 Copied from CRM #[id number]. Topic: Referral - Question >> Dec 12, 2024 10:23 AM Thersia BROCKS wrote: Reason for CRM: Patient called in stated she got a letter stated her cardiologist is retiring, stated she wants to stay in [place], Patient called in stated she needs a referral for her new cardiologist  Patient would like a callback

## 2024-12-13 NOTE — Telephone Encounter (Signed)
 Pt.notified

## 2024-12-13 NOTE — Telephone Encounter (Signed)
 I am ok with her seeing Dr Wilburn (the MD her cardiologist referred her to).  She can call them to make appt. Any problems, let us  know.

## 2025-01-10 ENCOUNTER — Other Ambulatory Visit: Payer: Self-pay | Admitting: Pulmonary Disease

## 2025-01-10 DIAGNOSIS — J479 Bronchiectasis, uncomplicated: Secondary | ICD-10-CM

## 2025-01-24 ENCOUNTER — Ambulatory Visit

## 2025-01-24 ENCOUNTER — Ambulatory Visit
Admission: RE | Admit: 2025-01-24 | Discharge: 2025-01-24 | Disposition: A | Discharge status: Home / Self Care | Source: Ambulatory Visit | Attending: Pulmonary Disease | Admitting: Pulmonary Disease

## 2025-01-24 DIAGNOSIS — J479 Bronchiectasis, uncomplicated: Secondary | ICD-10-CM | POA: Insufficient documentation

## 2025-01-24 MED ORDER — IOHEXOL 300 MG/ML  SOLN
75.0000 mL | Freq: Once | INTRAMUSCULAR | Status: AC | PRN
  Administered 2025-01-24: 75 mL via INTRAVENOUS

## 2025-02-05 ENCOUNTER — Ambulatory Visit: Admitting: Dermatology

## 2025-02-14 ENCOUNTER — Other Ambulatory Visit

## 2025-02-19 ENCOUNTER — Ambulatory Visit: Admitting: Internal Medicine

## 2025-02-22 ENCOUNTER — Ambulatory Visit: Admitting: General Surgery

## 2025-07-24 ENCOUNTER — Ambulatory Visit: Admitting: Dermatology
